# Patient Record
Sex: Female | Born: 1975 | Race: Black or African American | Hispanic: No | Smoking: Current every day smoker
Health system: Southern US, Academic
[De-identification: ages and names within clinical notes are randomized; demographics above are authoritative.]

## PROBLEM LIST (undated history)

## (undated) DIAGNOSIS — I219 Acute myocardial infarction, unspecified: Secondary | ICD-10-CM

## (undated) DIAGNOSIS — I340 Nonrheumatic mitral (valve) insufficiency: Secondary | ICD-10-CM

## (undated) DIAGNOSIS — G4730 Sleep apnea, unspecified: Secondary | ICD-10-CM

## (undated) DIAGNOSIS — Z862 Personal history of diseases of the blood and blood-forming organs and certain disorders involving the immune mechanism: Secondary | ICD-10-CM

## (undated) DIAGNOSIS — I5032 Chronic diastolic (congestive) heart failure: Secondary | ICD-10-CM

## (undated) DIAGNOSIS — I10 Essential (primary) hypertension: Secondary | ICD-10-CM

## (undated) DIAGNOSIS — J45909 Unspecified asthma, uncomplicated: Secondary | ICD-10-CM

## (undated) DIAGNOSIS — N189 Chronic kidney disease, unspecified: Secondary | ICD-10-CM

## (undated) DIAGNOSIS — J811 Chronic pulmonary edema: Secondary | ICD-10-CM

## (undated) DIAGNOSIS — K219 Gastro-esophageal reflux disease without esophagitis: Secondary | ICD-10-CM

## (undated) DIAGNOSIS — I071 Rheumatic tricuspid insufficiency: Secondary | ICD-10-CM

## (undated) DIAGNOSIS — N186 End stage renal disease: Secondary | ICD-10-CM

## (undated) DIAGNOSIS — Z9981 Dependence on supplemental oxygen: Secondary | ICD-10-CM

## (undated) DIAGNOSIS — I871 Compression of vein: Secondary | ICD-10-CM

## (undated) DIAGNOSIS — I639 Cerebral infarction, unspecified: Secondary | ICD-10-CM

## (undated) DIAGNOSIS — I2510 Atherosclerotic heart disease of native coronary artery without angina pectoris: Secondary | ICD-10-CM

## (undated) DIAGNOSIS — J449 Chronic obstructive pulmonary disease, unspecified: Secondary | ICD-10-CM

## (undated) DIAGNOSIS — Z8739 Personal history of other diseases of the musculoskeletal system and connective tissue: Secondary | ICD-10-CM

## (undated) DIAGNOSIS — I509 Heart failure, unspecified: Secondary | ICD-10-CM

## (undated) DIAGNOSIS — Z992 Dependence on renal dialysis: Secondary | ICD-10-CM

## (undated) DIAGNOSIS — N289 Disorder of kidney and ureter, unspecified: Secondary | ICD-10-CM

## (undated) HISTORY — PX: CHOLECYSTECTOMY: SHX55

## (undated) HISTORY — PX: FOOT SURGERY: SHX648

## (undated) HISTORY — PX: BACK SURGERY: SHX140

## (undated) HISTORY — PX: ABDOMINAL HYSTERECTOMY: SHX81

## (undated) HISTORY — PX: TONSILLECTOMY: SUR1361

## (undated) HISTORY — PX: HX FOOT SURGERY: 2100001154

## (undated) HISTORY — PX: HX TONSILLECTOMY: SHX27

## (undated) HISTORY — PX: HX HYSTERECTOMY: SHX81

## (undated) HISTORY — PX: ESOPHAGOGASTRODUODENOSCOPY: SHX1529

## (undated) HISTORY — PX: CORONARY ARTERY ANGIOPLASTY: PR CATH30428

## (undated) HISTORY — PX: HX GALL BLADDER SURGERY/CHOLE: SHX55

## (undated) HISTORY — PX: HX BACK SURGERY: SHX140

---
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
## 1999-02-25 DIAGNOSIS — I219 Acute myocardial infarction, unspecified: Secondary | ICD-10-CM

## 1999-02-25 HISTORY — DX: Acute myocardial infarction, unspecified: I21.9

## 2013-03-02 DIAGNOSIS — N186 End stage renal disease: Secondary | ICD-10-CM | POA: Insufficient documentation

## 2013-04-12 DIAGNOSIS — G4733 Obstructive sleep apnea (adult) (pediatric): Secondary | ICD-10-CM | POA: Insufficient documentation

## 2014-04-08 DIAGNOSIS — J449 Chronic obstructive pulmonary disease, unspecified: Secondary | ICD-10-CM | POA: Insufficient documentation

## 2014-04-08 DIAGNOSIS — N838 Other noninflammatory disorders of ovary, fallopian tube and broad ligament: Secondary | ICD-10-CM | POA: Insufficient documentation

## 2014-04-08 DIAGNOSIS — Z8711 Personal history of peptic ulcer disease: Secondary | ICD-10-CM | POA: Insufficient documentation

## 2014-04-14 DIAGNOSIS — I272 Pulmonary hypertension, unspecified: Secondary | ICD-10-CM | POA: Insufficient documentation

## 2015-02-25 DIAGNOSIS — I639 Cerebral infarction, unspecified: Secondary | ICD-10-CM

## 2015-02-25 HISTORY — DX: Cerebral infarction, unspecified: I63.9

## 2017-04-12 ENCOUNTER — Encounter (HOSPITAL_COMMUNITY): Payer: Self-pay

## 2017-04-12 ENCOUNTER — Other Ambulatory Visit: Payer: Self-pay

## 2017-04-12 ENCOUNTER — Inpatient Hospital Stay (HOSPITAL_COMMUNITY)
Admission: EM | Admit: 2017-04-12 | Discharge: 2017-04-14 | DRG: 313 | Disposition: A | Discharge status: Home / Self Care | Payer: Medicare Other | Attending: Internal Medicine | Admitting: Internal Medicine

## 2017-04-12 ENCOUNTER — Emergency Department (HOSPITAL_COMMUNITY): Payer: Medicare Other

## 2017-04-12 DIAGNOSIS — R0789 Other chest pain: Secondary | ICD-10-CM | POA: Diagnosis present

## 2017-04-12 DIAGNOSIS — E8889 Other specified metabolic disorders: Secondary | ICD-10-CM | POA: Diagnosis present

## 2017-04-12 DIAGNOSIS — G4733 Obstructive sleep apnea (adult) (pediatric): Secondary | ICD-10-CM | POA: Diagnosis present

## 2017-04-12 DIAGNOSIS — I5030 Unspecified diastolic (congestive) heart failure: Secondary | ICD-10-CM | POA: Diagnosis present

## 2017-04-12 DIAGNOSIS — Z888 Allergy status to other drugs, medicaments and biological substances status: Secondary | ICD-10-CM

## 2017-04-12 DIAGNOSIS — Z79899 Other long term (current) drug therapy: Secondary | ICD-10-CM | POA: Diagnosis not present

## 2017-04-12 DIAGNOSIS — I2510 Atherosclerotic heart disease of native coronary artery without angina pectoris: Secondary | ICD-10-CM | POA: Diagnosis present

## 2017-04-12 DIAGNOSIS — K219 Gastro-esophageal reflux disease without esophagitis: Secondary | ICD-10-CM | POA: Diagnosis present

## 2017-04-12 DIAGNOSIS — J449 Chronic obstructive pulmonary disease, unspecified: Secondary | ICD-10-CM | POA: Diagnosis present

## 2017-04-12 DIAGNOSIS — F17210 Nicotine dependence, cigarettes, uncomplicated: Secondary | ICD-10-CM | POA: Diagnosis present

## 2017-04-12 DIAGNOSIS — Z8673 Personal history of transient ischemic attack (TIA), and cerebral infarction without residual deficits: Secondary | ICD-10-CM | POA: Diagnosis not present

## 2017-04-12 DIAGNOSIS — M79609 Pain in unspecified limb: Secondary | ICD-10-CM | POA: Diagnosis not present

## 2017-04-12 DIAGNOSIS — Z720 Tobacco use: Secondary | ICD-10-CM | POA: Diagnosis not present

## 2017-04-12 DIAGNOSIS — I4581 Long QT syndrome: Secondary | ICD-10-CM | POA: Diagnosis present

## 2017-04-12 DIAGNOSIS — E876 Hypokalemia: Secondary | ICD-10-CM | POA: Diagnosis present

## 2017-04-12 DIAGNOSIS — R079 Chest pain, unspecified: Secondary | ICD-10-CM | POA: Diagnosis not present

## 2017-04-12 DIAGNOSIS — M79605 Pain in left leg: Secondary | ICD-10-CM | POA: Diagnosis present

## 2017-04-12 DIAGNOSIS — R109 Unspecified abdominal pain: Secondary | ICD-10-CM | POA: Diagnosis present

## 2017-04-12 DIAGNOSIS — I132 Hypertensive heart and chronic kidney disease with heart failure and with stage 5 chronic kidney disease, or end stage renal disease: Secondary | ICD-10-CM | POA: Diagnosis present

## 2017-04-12 DIAGNOSIS — M79652 Pain in left thigh: Secondary | ICD-10-CM | POA: Diagnosis present

## 2017-04-12 DIAGNOSIS — I252 Old myocardial infarction: Secondary | ICD-10-CM

## 2017-04-12 DIAGNOSIS — N2581 Secondary hyperparathyroidism of renal origin: Secondary | ICD-10-CM | POA: Diagnosis present

## 2017-04-12 DIAGNOSIS — Z992 Dependence on renal dialysis: Secondary | ICD-10-CM

## 2017-04-12 DIAGNOSIS — R200 Anesthesia of skin: Secondary | ICD-10-CM | POA: Diagnosis not present

## 2017-04-12 DIAGNOSIS — N186 End stage renal disease: Secondary | ICD-10-CM | POA: Diagnosis present

## 2017-04-12 DIAGNOSIS — I10 Essential (primary) hypertension: Secondary | ICD-10-CM

## 2017-04-12 DIAGNOSIS — D638 Anemia in other chronic diseases classified elsewhere: Secondary | ICD-10-CM | POA: Diagnosis present

## 2017-04-12 DIAGNOSIS — I120 Hypertensive chronic kidney disease with stage 5 chronic kidney disease or end stage renal disease: Secondary | ICD-10-CM | POA: Diagnosis not present

## 2017-04-12 DIAGNOSIS — M7918 Myalgia, other site: Secondary | ICD-10-CM | POA: Diagnosis not present

## 2017-04-12 DIAGNOSIS — E872 Acidosis: Secondary | ICD-10-CM | POA: Diagnosis not present

## 2017-04-12 DIAGNOSIS — E8770 Fluid overload, unspecified: Secondary | ICD-10-CM | POA: Diagnosis not present

## 2017-04-12 HISTORY — DX: Personal history of other diseases of the musculoskeletal system and connective tissue: Z87.39

## 2017-04-12 HISTORY — DX: Unspecified asthma, uncomplicated: J45.909

## 2017-04-12 HISTORY — DX: Heart failure, unspecified: I50.9

## 2017-04-12 HISTORY — DX: Dependence on renal dialysis: Z99.2

## 2017-04-12 HISTORY — DX: Disorder of kidney and ureter, unspecified: N28.9

## 2017-04-12 HISTORY — DX: Atherosclerotic heart disease of native coronary artery without angina pectoris: I25.10

## 2017-04-12 HISTORY — DX: Sleep apnea, unspecified: G47.30

## 2017-04-12 HISTORY — DX: Chronic obstructive pulmonary disease, unspecified: J44.9

## 2017-04-12 HISTORY — DX: Gastro-esophageal reflux disease without esophagitis: K21.9

## 2017-04-12 HISTORY — DX: Essential (primary) hypertension: I10

## 2017-04-12 HISTORY — DX: Cerebral infarction, unspecified: I63.9

## 2017-04-12 HISTORY — DX: Acute myocardial infarction, unspecified: I21.9

## 2017-04-12 LAB — BASIC METABOLIC PANEL
ANION GAP: 15 (ref 5–15)
BUN: 38 mg/dL — ABNORMAL HIGH (ref 6–20)
CHLORIDE: 99 mmol/L — AB (ref 101–111)
CO2: 22 mmol/L (ref 22–32)
Calcium: 10 mg/dL (ref 8.9–10.3)
Creatinine, Ser: 4.11 mg/dL — ABNORMAL HIGH (ref 0.44–1.00)
GFR calc non Af Amer: 12 mL/min — ABNORMAL LOW (ref >60)
GFR, EST AFRICAN AMERICAN: 14 mL/min — AB (ref >60)
Glucose, Bld: 91 mg/dL (ref 65–99)
POTASSIUM: 3.6 mmol/L (ref 3.5–5.1)
Sodium: 136 mmol/L (ref 135–145)

## 2017-04-12 LAB — CBC
HCT: 44.1 % (ref 36.0–46.0)
HEMOGLOBIN: 14.9 g/dL (ref 12.0–15.0)
MCH: 31.3 pg (ref 26.0–34.0)
MCHC: 33.8 g/dL (ref 30.0–36.0)
MCV: 92.6 fL (ref 78.0–100.0)
Platelets: 208 10*3/uL (ref 150–400)
RBC: 4.76 MIL/uL (ref 3.87–5.11)
RDW: 15.1 % (ref 11.5–15.5)
WBC: 12.5 10*3/uL — ABNORMAL HIGH (ref 4.0–10.5)

## 2017-04-12 LAB — I-STAT TROPONIN, ED: Troponin i, poc: 0.05 ng/mL (ref 0.00–0.08)

## 2017-04-12 LAB — I-STAT BETA HCG BLOOD, ED (MC, WL, AP ONLY): I-stat hCG, quantitative: <5 m[IU]/mL (ref <5)

## 2017-04-12 MED ORDER — MOMETASONE FURO-FORMOTEROL FUM 200-5 MCG/ACT IN AERO
2.0000 | INHALATION_SPRAY | Freq: Two times a day (BID) | RESPIRATORY_TRACT | Status: DC
  Administered 2017-04-12 – 2017-04-14 (×4): 2 via RESPIRATORY_TRACT
  Filled 2017-04-12 (×2): qty 8.8

## 2017-04-12 MED ORDER — ALBUTEROL SULFATE (2.5 MG/3ML) 0.083% IN NEBU: 2.5000 mg | INHALATION_SOLUTION | Freq: Four times a day (QID) | RESPIRATORY_TRACT | Status: DC | PRN

## 2017-04-12 MED ORDER — ISOSORBIDE MONONITRATE ER 30 MG PO TB24: 60.0000 mg | ORAL_TABLET | Freq: Every day | ORAL | Status: DC

## 2017-04-12 MED ORDER — CARVEDILOL 25 MG PO TABS
25.0000 mg | ORAL_TABLET | Freq: Two times a day (BID) | ORAL | Status: DC
  Administered 2017-04-12 – 2017-04-14 (×5): 25 mg via ORAL
  Filled 2017-04-12 (×2): qty 1
  Filled 2017-04-12: qty 2
  Filled 2017-04-12 (×2): qty 1

## 2017-04-12 MED ORDER — FLUTICASONE PROPIONATE 50 MCG/ACT NA SUSP
1.0000 | Freq: Two times a day (BID) | NASAL | Status: DC
  Administered 2017-04-12 – 2017-04-14 (×4): 1 via NASAL
  Filled 2017-04-12 (×2): qty 16

## 2017-04-12 MED ORDER — ONDANSETRON HCL 4 MG PO TABS: 4.0000 mg | ORAL_TABLET | Freq: Three times a day (TID) | ORAL | Status: DC | PRN

## 2017-04-12 MED ORDER — HYDRALAZINE HCL 50 MG PO TABS
100.0000 mg | ORAL_TABLET | Freq: Three times a day (TID) | ORAL | Status: DC
  Administered 2017-04-12 – 2017-04-14 (×7): 100 mg via ORAL
  Filled 2017-04-12 (×7): qty 2

## 2017-04-12 MED ORDER — ASPIRIN EC 81 MG PO TBEC
81.0000 mg | DELAYED_RELEASE_TABLET | Freq: Every day | ORAL | Status: DC
  Filled 2017-04-12 (×2): qty 1

## 2017-04-12 MED ORDER — HEPARIN SODIUM (PORCINE) 5000 UNIT/ML IJ SOLN
5000.0000 [IU] | Freq: Three times a day (TID) | INTRAMUSCULAR | Status: DC
  Filled 2017-04-12 (×2): qty 1

## 2017-04-12 MED ORDER — NITROGLYCERIN 0.4 MG SL SUBL
0.4000 mg | SUBLINGUAL_TABLET | Freq: Once | SUBLINGUAL | Status: AC
Start: 2017-04-12 — End: 2017-04-12
  Administered 2017-04-12: 0.4 mg via SUBLINGUAL
  Filled 2017-04-12: qty 1

## 2017-04-12 MED ORDER — HYDROCODONE-ACETAMINOPHEN 5-325 MG PO TABS
1.0000 | ORAL_TABLET | Freq: Once | ORAL | Status: AC
  Administered 2017-04-12: 1 via ORAL
  Filled 2017-04-12: qty 1

## 2017-04-12 MED ORDER — HYDROCODONE-ACETAMINOPHEN 5-325 MG PO TABS
1.0000 | ORAL_TABLET | Freq: Three times a day (TID) | ORAL | Status: DC | PRN
  Administered 2017-04-12 – 2017-04-14 (×4): 1 via ORAL
  Filled 2017-04-12 (×4): qty 1

## 2017-04-12 MED ORDER — FUROSEMIDE 10 MG/ML IJ SOLN
120.0000 mg | Freq: Once | INTRAVENOUS | Status: AC
  Administered 2017-04-12: 120 mg via INTRAVENOUS
  Filled 2017-04-12: qty 12

## 2017-04-12 MED ORDER — ISOSORBIDE MONONITRATE ER 60 MG PO TB24
60.0000 mg | ORAL_TABLET | Freq: Every day | ORAL | Status: DC
  Administered 2017-04-13 – 2017-04-14 (×2): 60 mg via ORAL
  Filled 2017-04-12 (×2): qty 1

## 2017-04-12 MED ORDER — NIFEDIPINE ER 60 MG PO TB24
60.0000 mg | ORAL_TABLET | Freq: Two times a day (BID) | ORAL | Status: DC
  Administered 2017-04-12 – 2017-04-14 (×5): 60 mg via ORAL
  Filled 2017-04-12 (×6): qty 1

## 2017-04-12 NOTE — ED Notes (Signed)
Admitting still at bedside 

## 2017-04-12 NOTE — ED Notes (Signed)
Lunch tray ordered 

## 2017-04-12 NOTE — ED Notes (Signed)
Admitting paged regarding pt CP. Pt reports if NTG does not work for her pain, Norco sometimes does. Admitting notified

## 2017-04-12 NOTE — ED Notes (Addendum)
Pt changing into gown at this time. Ambulatory to lobby

## 2017-04-12 NOTE — ED Notes (Signed)
Lunch tray at bedside. ?

## 2017-04-12 NOTE — ED Triage Notes (Signed)
Patient complains of CP with radiation to left arm x 2 days. Patient reports that she is dialysis patient and last treatment this past Tuesday. Had labs checked Thursday and told she didn't need treatment, no SOB

## 2017-04-12 NOTE — ED Notes (Signed)
Admitting at the bedside, will administer NTG when admitting finished with assessment.

## 2017-04-12 NOTE — ED Notes (Signed)
Pt placed on hospital bed

## 2017-04-12 NOTE — ED Notes (Addendum)
Unmeasured urinary output. While attempting to urinate, purewick suction turned off and then resumed after pt finished voiding when this RN tightened connection to tubing. Purewick repositioned, linens changed, and purewick secured with tape

## 2017-04-12 NOTE — ED Provider Notes (Signed)
[facility] Provider Note   CSN: [id number] Arrival date & time: 04/12/17  [id number]     History   Chief Complaint No chief complaint on file.   HPI Jacqueline Blackburn is a 42 y.o. female.  HPI Patient presents with chest pain some shortness of breath pain is on the left chest and goes to left arm.  Has had this before.  States she has had 16 heart attacks but never had a stent.  States that she recently moved up here from [place].  Has an nephrologist and has seen a PA but not the attending yet.  Has an appointment to see cardiology.  History of congestive heart failure.  Does not know what her ejection fraction is however.  States she has been taking her medicines.  States her blood pressures been running high the last couple days.  The chest pain is also been for the last couple days.  She was last dialyzed on Tuesday.  She is been on dialysis for 11 years.  States that she was not dialyzed on Thursday because they checked some lab work and she was told she did not need to have dialysis.  Took a nitroglycerin yesterday. Past Medical History:  Diagnosis Date  . CHF (congestive heart failure) (HCC)   . Coronary artery disease   . Hemodialysis patient (HCC)   . Hypertension   . Renal disorder     There are no active problems to display for this patient.   History reviewed. No pertinent surgical history.  OB History    No data available       Home Medications    Prior to Admission medications   Medication Sig Start Date End Date Taking? Authorizing Provider  albuterol (PROVENTIL HFA;VENTOLIN HFA) 108 (90 Base) MCG/ACT inhaler Inhale 2 puffs into the lungs every 4 (four) hours as needed for wheezing or shortness of breath.   Yes [provider]  aspirin EC 81 MG tablet Take 81 mg by mouth daily.   Yes [provider]  aspirin-acetaminophen-caffeine (EXCEDRIN MIGRAINE) 650-206-8531250-250-65 MG tablet Take 2 tablets by mouth every 6 (six)  hours as needed for headache.   Yes [provider]  carvedilol (COREG) 25 MG tablet Take 25 mg by mouth 2 (two) times daily with a meal.   Yes [provider]  cloNIDine (CATAPRES - DOSED IN MG/24 HR) 0.1 mg/24hr patch Place 0.1 mg onto the skin once a week.   Yes [provider]  cloNIDine (CATAPRES - DOSED IN MG/24 HR) 0.3 mg/24hr patch Place 0.3 mg onto the skin once a week.   Yes [provider]  fluticasone (FLONASE) 50 MCG/ACT nasal spray Place 1 spray into both nostrils 2 (two) times daily.   Yes [provider]  furosemide (LASIX) 80 MG tablet Take 160 mg by mouth 2 (two) times daily.   Yes [provider]  hydrALAZINE (APRESOLINE) 100 MG tablet Take 100 mg by mouth 3 (three) times daily.   Yes [provider]  isosorbide mononitrate (IMDUR) 60 MG 24 hr tablet Take 60 mg by mouth daily.   Yes [provider]  ondansetron (ZOFRAN) 4 MG tablet Take 4 mg by mouth every 8 (eight) hours as needed for nausea or vomiting.   Yes [provider]    Family History No family history on file.  Social History Social History   Tobacco Use  . Smoking status: Current Every Day Smoker  . Smokeless tobacco: Never  Used  Substance Use Topics  . Alcohol use: Not on file  . Drug use: Not on file     Allergies   Lisinopril   Review of Systems Review of Systems  Constitutional: Positive for appetite change.  HENT: Negative for congestion.   Respiratory: Positive for shortness of breath.   Cardiovascular: Positive for chest pain.  Gastrointestinal: Negative for abdominal pain.  Genitourinary: Negative for dysuria.  Musculoskeletal: Negative for back pain.  Skin: Negative for rash.  Neurological: Negative for numbness.  Hematological: Negative for adenopathy.  Psychiatric/Behavioral: Negative for confusion.     Physical Exam Updated Vital Signs BP (!) 182/99   Pulse 69   Temp 98.5 F (36.9 C) (Oral)    Resp 18   SpO2 100%   Physical Exam  Constitutional: She appears well-developed.  HENT:  Head: Atraumatic.  Eyes: Pupils are equal, round, and reactive to light.  Cardiovascular: Normal rate.  Pulmonary/Chest: She has wheezes. She has no rales. She exhibits no tenderness.  Abdominal: Soft.  Musculoskeletal:  Dialysis graft to left upper extremity.  Neurological: She is alert.  Skin: Skin is warm. Capillary refill takes less than 2 seconds.  Psychiatric: She has a normal mood and affect.     ED Treatments / Results  Labs (all labs ordered are listed, but only abnormal results are displayed) Labs Reviewed  BASIC METABOLIC PANEL - Abnormal; Notable for the following components:      Result Value   Chloride 99 (*)    BUN 38 (*)    Creatinine, Ser 4.11 (*)    GFR calc non Af Amer 12 (*)    GFR calc Af Amer 14 (*)    All other components within normal limits  CBC - Abnormal; Notable for the following components:   WBC 12.5 (*)    All other components within normal limits  I-STAT TROPONIN, ED  I-STAT BETA HCG BLOOD, ED ([facility], [facility], [facility] ONLY)    EKG  EKG Interpretation  Date/Time:  Sunday April 12 2017 09:00:51 EST Ventricular Rate:  72 PR Interval:  206 QRS Duration: 100 QT Interval:  464 QTC Calculation: 508 R Axis:   -40 Text Interpretation:  Normal sinus rhythm Possible Left atrial enlargement Left axis deviation Left ventricular hypertrophy with repolarization abnormality Prolonged QT Abnormal ECG Confirmed by Benjiman Core [phone]) on 04/12/2017 11:58:38 AM       Radiology Dg Chest 2 View  Result Date: 04/12/2017 CLINICAL DATA:  Chest pain radiating to left arm 2 days. Last dialysis treatment 6 days ago. EXAM: CHEST  2 VIEW COMPARISON:  None. FINDINGS: Lungs are adequately inflated without focal lobar consolidation or effusion. Subtle hazy prominence of the perihilar vessels likely mild degree of vascular congestion. Evidence of left axillary vascular stent.  Cardiomediastinal silhouette and remainder of the exam is unremarkable. IMPRESSION: Suggestion of minimal vascular congestion. Electronically Signed   By: Elberta Fortis M.D.   On: 04/12/2017 09:46    Procedures Procedures (including critical care time)  Medications Ordered in ED Medications  nitroGLYCERIN (NITROSTAT) SL tablet 0.4 mg (not administered)     Initial Impression / Assessment and Plan / ED Course  I have reviewed the triage vital signs and the nursing notes.  Pertinent labs & imaging results that were available during my care of the patient were reviewed by me and considered in my medical decision making (see chart for details).     Patient presents with chest pain.  Left-sided.  Pain improved somewhat now  but still dull.  EKG has nonspecific changes but no old to compare.  Patient states she has had 16 heart attacks but no stent.  Patient's blood pressure is elevated.  X-ray shows mild vascular congestion.  Last dialyzed on Tuesday.  With the chest pain and mild volume overload I think patient would benefit from admission especially since we do not know her true cardiac history.  Patient is also had some wheezes.  History of asthma.  Final Clinical Impressions(s) / ED Diagnoses   Final diagnoses:  Chest pain, unspecified type  End stage renal disease on dialysis [facility])  Hypertension, unspecified type    ED Discharge Orders    None       Benjiman Core, MD 04/12/17 1246

## 2017-04-12 NOTE — ED Notes (Signed)
Pt requesting ice.

## 2017-04-12 NOTE — H&P (Signed)
Date: 04/12/2017               Patient Name:  Jacqueline Blackburn MRN: [id number]  DOB: October 30, 1975 Age / Sex: 42 y.o., female   PCP: Patient, No Pcp Per         Medical Service: Internal Medicine Teaching Service         Attending Physician: Dr. Lynnae January, Real Cons, MD    First Contact: Dr. Johny Chess Pager: [phone]  Second Contact: Dr. Philipp Ovens Pager: [phone]       After Hours (After 5p/  First Contact Pager: [phone]  weekends / holidays): Second Contact Pager: [phone]   Chief Complaint: Chest Pain  History of Present Illness: Jacqueline Blackburn is a 42 yo F with a past medical history of CAD, CHF, ESRD on HD, OSA, COPD/asthma, tobacco use, GERD who presented to the ED with complaints of chest pain.   She has had a two day history of left sided chest pain described as a tightness which has been constant. Denies modifying factors but does note some mild dyspnea on exertion. No associated diaphoresis. She also notes an associated headache (posterior, no photophobia), intermittent blurry vision, and diarrhea (4 BMs yesterday) which also started two days ago.    She has a history of CAD and CHF per her report, previously seen by a Cardiologist in [place], last cath performed about 1 year ago--she was told there were no significant blockages that required stents at that time. She has a history of similar pain and notes multiple heart attacks in the past but states the pain has been more severe in the past. She has not missed any medication doses and is taking all meds as prescribed. She has an outpatient dialysis center set up and last received HD five days ago on 2/12. She states she was told at her following session that she did not need HD at that point and they would try to get her off HD based on her lab work and urine output.  In the ED, T 98.5, BP 208/106, 100% on RA. Labs notable for Cr 4.11, BUN 38, eGFR 14, I stat troponin negative, Hgb 14.9. She was given ntg and admitted for further  management.     Meds:  Current Meds  Medication Sig  . albuterol (PROVENTIL HFA;VENTOLIN HFA) 108 (90 Base) MCG/ACT inhaler Inhale 2 puffs into the lungs every 4 (four) hours as needed for wheezing or shortness of breath.  Marland Kitchen aspirin EC 81 MG tablet Take 81 mg by mouth daily.  Marland Kitchen aspirin-acetaminophen-caffeine (EXCEDRIN MIGRAINE) 250-250-65 MG tablet Take 2 tablets by mouth every 6 (six) hours as needed for headache.  . carvedilol (COREG) 25 MG tablet Take 25 mg by mouth 2 (two) times daily with a meal.  . cloNIDine (CATAPRES - DOSED IN MG/24 HR) 0.1 mg/24hr patch Place 0.1 mg onto the skin once a week.  . cloNIDine (CATAPRES - DOSED IN MG/24 HR) 0.3 mg/24hr patch Place 0.3 mg onto the skin once a week.  . fluticasone (FLONASE) 50 MCG/ACT nasal spray Place 1 spray into both nostrils 2 (two) times daily.  . furosemide (LASIX) 80 MG tablet Take 160 mg by mouth 2 (two) times daily.  . hydrALAZINE (APRESOLINE) 100 MG tablet Take 100 mg by mouth 3 (three) times daily.  . isosorbide mononitrate (IMDUR) 60 MG 24 hr tablet Take 60 mg by mouth daily.  . ondansetron (ZOFRAN) 4 MG tablet Take 4 mg by mouth every 8 (eight) hours as  needed for nausea or vomiting.     Allergies: Allergies as of 04/12/2017 - Review Complete 04/12/2017  Allergen Reaction Noted  . Lisinopril Other (See Comments) 04/12/2017   Past Medical History:  Diagnosis Date  . Asthma   . CHF (congestive heart failure) ([place])   . COPD (chronic obstructive pulmonary disease) ([place])   . Coronary artery disease   . GERD (gastroesophageal reflux disease)   . Hemodialysis patient ([place])   . History of degenerative disc disease   . Hypertension   . MI (myocardial infarction) ([place]) 2001   Patient reports 16 MIs  . Renal disorder    ESRD  . Sleep apnea   . Stroke [facility]) 2017   "mild stroke/TIA"    Family History: Hypertension and DM in multiple family members.  Maternal family members: Multiple members with cancer, unsure types    Social History: Tobacco use since age 59, currently smoking about 1/4th ppd. No alcohol use. Regular marijuana use, no other drug use.  She just moved here after being in [place] and [place] before this, has been living here for the past three weeks.   Review of Systems: A complete ROS was negative except as per HPI.   Physical Exam: Blood pressure (!) 182/103, pulse (!) 32, temperature 98.5 F (36.9 C), temperature source Oral, resp. rate 17, SpO2 (!) 75 %. General: Sitting up in bed comfortably, no acute distress Head: Normocephalic, atraumatic Eyes: PERRL, EOMI ENT: Moist mucus membranes, no pharyngeal exudate CV: RRR at time of exam, diastolic murmur though may be radiation of fistula  Resp: Occasional wheeze to L lung fields, otherwise no significant crackles, normal work of breathing, no distress  Abd: Soft, +BS, non-tender to palpation  Extr: Slight LE edema, TTP of proximal L thigh with no overlying skin changes or deformities noted. LUE AVF with palpable thrill   Neuro: Alert and oriented x3, no FND on gross exam  Skin: Warm, dry      EKG: personally reviewed my interpretation is sinus rhythm, prolonged PR and QT intervals, inverted T in V6, I and aVL, no other ST changes appreciated. No prior available for comparison.   CXR: personally reviewed my interpretation is no pleural effusions, no significant cardiomegaly, no focal consolidations. Read as mild vascular congestion with prominent hilar vessels.   Assessment & Plan by Problem:  Chest Pain, H/o HTN, HF, CAD Pt reports a significant cardiac history with prior care and workup under her prior cardiologist in [place]. Unclear as to the extent of her CAD though reports no stents or other interventions required, or her HF. Currently on an outpatient Nifedipine, Hydral, Lasix, Coreg, Imdur, clonidine patches for blood pressure and cardiac regimen. Her EKG is without ischemic changes, troponin negative in setting of sx  for two days and renal disease. Unlikely to be ACS and may be more related to volume status and elevated BP in the setting of a period without dialysis sessions. Will attempt to obtain records to clarify cardiology history and trial an increase in diuretic.  --Monitor vital signs, tele --Cont home regimen --BMP, I/O, daily weights  --Lasix 120 mg x 1, assess response on output, BP  --BMP --Request records in am      ESRD on HD Pt has been on dialysis for ~10 years due to hypertension associated renal disease per her report. She has established with [facility] for an HD center in [place] was told she may be able to come off HD and did not receive  her scheduled Thursday session. Her prior EDW was 102, recently increased to 104--today she is at 107.3. Her BP and acute elevation despite no missed home medication doses may reflect this period of no HD and increased dry weight. Her GFR, Hgb, and urine output are higher than would expect for a long term HD patient and may be able to diurese as above. No indications for urgent HD.  --Notify nephrology in am    H/o COPD, OSA Long smoking history and currently on home albuterol prn and dulera. Has slight wheezing, no respiratory distress. Does not wear CPAP at home  --Cont home dulera  --Albuterol nebulizer q6hr prn      Dispo: Admit patient to Observation with expected length of stay less than 2 midnights.  Signed: Tawny Asal, MD 04/12/2017, 2:46 PM  Pager: [phone]

## 2017-04-12 NOTE — ED Notes (Signed)
Attempted report x1. 

## 2017-04-13 ENCOUNTER — Other Ambulatory Visit: Payer: Self-pay

## 2017-04-13 DIAGNOSIS — R109 Unspecified abdominal pain: Secondary | ICD-10-CM

## 2017-04-13 DIAGNOSIS — Z79899 Other long term (current) drug therapy: Secondary | ICD-10-CM

## 2017-04-13 DIAGNOSIS — I120 Hypertensive chronic kidney disease with stage 5 chronic kidney disease or end stage renal disease: Secondary | ICD-10-CM

## 2017-04-13 DIAGNOSIS — N186 End stage renal disease: Secondary | ICD-10-CM

## 2017-04-13 DIAGNOSIS — Z720 Tobacco use: Secondary | ICD-10-CM

## 2017-04-13 DIAGNOSIS — R079 Chest pain, unspecified: Secondary | ICD-10-CM

## 2017-04-13 DIAGNOSIS — M7918 Myalgia, other site: Secondary | ICD-10-CM

## 2017-04-13 DIAGNOSIS — E8770 Fluid overload, unspecified: Secondary | ICD-10-CM

## 2017-04-13 DIAGNOSIS — G4733 Obstructive sleep apnea (adult) (pediatric): Secondary | ICD-10-CM

## 2017-04-13 DIAGNOSIS — M79652 Pain in left thigh: Secondary | ICD-10-CM

## 2017-04-13 DIAGNOSIS — Z992 Dependence on renal dialysis: Secondary | ICD-10-CM

## 2017-04-13 DIAGNOSIS — Z8679 Personal history of other diseases of the circulatory system: Secondary | ICD-10-CM

## 2017-04-13 DIAGNOSIS — I252 Old myocardial infarction: Secondary | ICD-10-CM

## 2017-04-13 DIAGNOSIS — R200 Anesthesia of skin: Secondary | ICD-10-CM

## 2017-04-13 LAB — BASIC METABOLIC PANEL WITH GFR
Anion gap: 14 (ref 5–15)
BUN: 39 mg/dL — ABNORMAL HIGH (ref 6–20)
CO2: 24 mmol/L (ref 22–32)
Calcium: 9.6 mg/dL (ref 8.9–10.3)
Chloride: 99 mmol/L — ABNORMAL LOW (ref 101–111)
Creatinine, Ser: 4.06 mg/dL — ABNORMAL HIGH (ref 0.44–1.00)
GFR calc Af Amer: 15 mL/min — ABNORMAL LOW
GFR calc non Af Amer: 13 mL/min — ABNORMAL LOW
Glucose, Bld: 102 mg/dL — ABNORMAL HIGH (ref 65–99)
Potassium: 3.1 mmol/L — ABNORMAL LOW (ref 3.5–5.1)
Sodium: 137 mmol/L (ref 135–145)

## 2017-04-13 LAB — BASIC METABOLIC PANEL
ANION GAP: 14 (ref 5–15)
BUN: 42 mg/dL — ABNORMAL HIGH (ref 6–20)
CO2: 21 mmol/L — ABNORMAL LOW (ref 22–32)
Calcium: 9.7 mg/dL (ref 8.9–10.3)
Chloride: 101 mmol/L (ref 101–111)
Creatinine, Ser: 4.22 mg/dL — ABNORMAL HIGH (ref 0.44–1.00)
GFR, EST AFRICAN AMERICAN: 14 mL/min — AB (ref >60)
GFR, EST NON AFRICAN AMERICAN: 12 mL/min — AB (ref >60)
Glucose, Bld: 78 mg/dL (ref 65–99)
Potassium: 3.6 mmol/L (ref 3.5–5.1)
Sodium: 136 mmol/L (ref 135–145)

## 2017-04-13 LAB — CBC
HCT: 42.6 % (ref 36.0–46.0)
Hemoglobin: 14.2 g/dL (ref 12.0–15.0)
MCH: 30.6 pg (ref 26.0–34.0)
MCHC: 33.3 g/dL (ref 30.0–36.0)
MCV: 91.8 fL (ref 78.0–100.0)
Platelets: 229 10*3/uL (ref 150–400)
RBC: 4.64 MIL/uL (ref 3.87–5.11)
RDW: 15.4 % (ref 11.5–15.5)
WBC: 11.7 10*3/uL — ABNORMAL HIGH (ref 4.0–10.5)

## 2017-04-13 LAB — HIV ANTIBODY (ROUTINE TESTING W REFLEX): HIV Screen 4th Generation wRfx: NONREACTIVE

## 2017-04-13 LAB — CK: Total CK: 152 U/L (ref 38–234)

## 2017-04-13 MED ORDER — POTASSIUM CHLORIDE CRYS ER 20 MEQ PO TBCR
40.0000 meq | EXTENDED_RELEASE_TABLET | Freq: Once | ORAL | Status: AC
  Administered 2017-04-13: 40 meq via ORAL
  Filled 2017-04-13: qty 2

## 2017-04-13 MED ORDER — ACETAMINOPHEN 325 MG PO TABS: 650.0000 mg | ORAL_TABLET | Freq: Four times a day (QID) | ORAL | Status: DC | PRN

## 2017-04-13 MED ORDER — FUROSEMIDE 80 MG PO TABS
160.0000 mg | ORAL_TABLET | Freq: Two times a day (BID) | ORAL | Status: DC
  Filled 2017-04-13: qty 2

## 2017-04-13 NOTE — Progress Notes (Signed)
Pt upset about care, states "the doctors are trying to treat my kidneys, but that's not what I came in for, I had chest pain and my L thigh hurts", attempted to explain to pt that fluid overload can cause chest pain. Pt's roommate/friend at bedside concerned pt not getting the care she needs. Pt gave verbal permission to talk about her with roommate while in the room as well as it was okay if she talked with the doctors. Team of doctors rounded on pt this morning pt states before her roommate arrived.  Dr Anthonette LegatoHarden, resident with Dr Rogelia BogaButcher paged at this time and informed him that pt's roommate at bedside and pt giving permission to talk with her, requesting someone to come back to room to talk with pt and roommate.  Dr Anthonette LegatoHarden informed pt refused aspirin and lasix.   Dr Anthonette LegatoHarden states he will be back up to talk with them.

## 2017-04-13 NOTE — Progress Notes (Addendum)
Subjective: No acute events overnight following admission, she put out 1.5 L with IV dose of lasix, net negative 1 L and weight down to 104 kg. She is frustrated this morning because of fluid restriction noted on her diet and having to urinate overnight. She states her chest pain is still present and states the pain is not isolated to her chest as in past episodes, but extends all the way down her arm to her hand, down her side/flank and to her proximal thigh. Unable to describe the pain stating "it just hurts", no pain below knee.    Objective:  Vital signs in last 24 hours: Vitals:   04/12/17 2039 04/12/17 2123 04/13/17 0510 04/13/17 0928  BP:  (!) 153/97 (!) 164/103   Pulse:   82   Resp:   18   Temp:   97.7 F (36.5 C)   TempSrc:   Oral   SpO2: 98%   99%  Weight:   230 lb 11.2 oz (104.6 kg)   Height:   5\' 7"  (1.702 m)    General: Resting in bed comfortably, no acute distress HEENT: Moist mucus membranes, normal conjuctiva  CV: RRR, questionable murmur v fistula thrill radiation  Resp: Clear breath sounds bilaterally, normal work of breathing, no distress  Abd: Soft, +BS, tenderness to L flank, otherwise non-tender  Extr: TTP of proximal L thigh with no overlying skin changes or deformities, no obvious asymmetry compared to R though difficult with habitus, no significant LE edema Neuro: Alert and oriented x3 Skin: Warm, dry      Assessment/Plan:   Chest Pain, Flank and Proximal Thigh Pain Pt presented with two day history of constant chest pain which is also extends to the flank and proximal thigh. Acute cardiac cause unlikely, no improvement with volume decrease. The pain is not over a certain neuro or dermatome distribution. Not on a statin and would expect more symmetric sx with other causes of myalgia/myopathies, may also be MSK related.  --CK --Tylenol 650, Norco 5-325 q8hr prn (dose note to exceed 3.5 g acetaminophen)    H/o HTN, HF, CAD  Pt reports a cardiac history  including CAD and HF, previously followed by a Cardiologist in [place]. No stents or other interventions required in the past, last cath or echo about one year ago. Currently on an extensive HTN regimen as an outpatient. Her EKG has non-specific T wave inversions in lateral leads (I, aVL, V6), no prior available for comparison. Troponin negative in setting of sx for two days and renal dz, not trended. Will attempt to obtain records for cardiology history.  --Monitor vital signs --Cont home HTN regimen--nifedipine may not be an ideal agent depending on HF hx --Resume home diuretic dosing   --Echo  --BMP, daily weights, I/Os  --Request records     ESRD on HD Pt has been on dialysis for ~10 years due to hypertension associated renal disease per her report. She has established with [facility] for an HD center in [place]--she was told she may be able to come off HD and did not receive her scheduled Thursday session. Her prior EDW was 102, recently increased to 104--was at 107.3 and responded well to IV Lasix dose with improvement in her weight to 104 kg. Her BP and acute elevation despite no missed home medication doses may have been due to no HD, increased weight. Nephrology consulted to clarify management of her renal disease and updated recommendations, appreciate their assistance.  --No urgent indication for  HD  --BMP   Dispo: Anticipated discharge in approximately 1-2 day(s).   Ginger Carne, MD 04/13/2017, 9:55 AM Pager: [phone]

## 2017-04-13 NOTE — Consult Note (Signed)
[facility] Renal Consultation Note    Indication for Consultation:  Management of ESRD/hemodialysis; anemia, hypertension/volume and secondary hyperparathyroidism PCP: No PCP here-recently relocated from [place].   HPI: Jacqueline Blackburn is a 42 y.o. female with ESRD on hemodialysis T,Th,S at [facility]. PMH of HF, MI, COPD (current smoker) HTN, OSA, AOCD, SHPT.   Patient recently relocated from [place] to [place], [place]. She started hemodialysis T,Th,S at [facility] but apparently had only been attending HD twice weekly at her former center. Labs drawn 03/31/17 revealed Scr 4.1 K+ 4.4 Phos 4.1 after missing HD for a week prior to move. 24 hour urine showed urea clearance of 13.  She was seen 04/07/2017 by Dr. Vanetta Mulders who decided to hold hemodialysis and monitor labs.    She presented to ED 04/12/17 with C/O L sided chest pain, mild SOB and diarrhea. She was found to be hypertensive with BP 208/106. CXR suggestive of minimal vascular congestion.  Scr 4.1 BUN 38 EGFR 14, HGB 14.9 WBC 12.5 Troponin 0.05 EKG showed SR with LAD, LVH, prolonged Qt interval. She has been admitted for chest pain per primary. Wt was up on admission to 107.3 kg. She has since been diuresed with IV furosemide and she is back to OP EDW 104 kg. She has never been chest pain free since admission, now C/O L chest pain radiation down L arm and L thigh. She rates pain 6/10 throbbing in nature. She denies SOB at present. Upset because she has urinated so much after furosemide.   Past Medical History:  Diagnosis Date  . Asthma   . CHF (congestive heart failure) ([place])   . COPD (chronic obstructive pulmonary disease) ([place])   . Coronary artery disease   . GERD (gastroesophageal reflux disease)   . Hemodialysis patient ([place])   . History of degenerative disc disease   . Hypertension   . MI (myocardial infarction) ([place]) 2001   Patient reports 16 MIs  . Renal disorder    ESRD   . Sleep apnea   . Stroke [facility]) 2017   "mild stroke/TIA"   Past Surgical History:  Procedure Laterality Date  . ABDOMINAL HYSTERECTOMY    . BACK SURGERY    . CHOLECYSTECTOMY    . FOOT SURGERY Right   . TONSILLECTOMY     No family history on file. Social History:  reports that she has been smoking.  she has never used smokeless tobacco. Her alcohol and drug histories are not on file. Allergies  Allergen Reactions  . Lisinopril Other (See Comments)   Prior to Admission medications   Medication Sig Start Date End Date Taking? Authorizing Provider  albuterol (PROVENTIL HFA;VENTOLIN HFA) 108 (90 Base) MCG/ACT inhaler Inhale 2 puffs into the lungs every 4 (four) hours as needed for wheezing or shortness of breath.   Yes [provider]  aspirin EC 81 MG tablet Take 81 mg by mouth daily.   Yes [provider]  aspirin-acetaminophen-caffeine (EXCEDRIN MIGRAINE) [phone] MG tablet Take 2 tablets by mouth every 6 (six) hours as needed for headache.   Yes [provider]  carvedilol (COREG) 25 MG tablet Take 25 mg by mouth 2 (two) times daily with a meal.   Yes [provider]  cloNIDine (CATAPRES - DOSED IN MG/24 HR) 0.1 mg/24hr patch Place 0.1 mg onto the skin once a week.   Yes [provider]  cloNIDine (CATAPRES - DOSED IN MG/24 HR) 0.3 mg/24hr patch Place 0.3 mg onto  the skin once a week.   Yes [provider]  fluticasone (FLONASE) 50 MCG/ACT nasal spray Place 1 spray into both nostrils 2 (two) times daily.   Yes [provider]  furosemide (LASIX) 80 MG tablet Take 160 mg by mouth 2 (two) times daily.   Yes [provider]  hydrALAZINE (APRESOLINE) 100 MG tablet Take 100 mg by mouth 3 (three) times daily.   Yes [provider]  isosorbide mononitrate (IMDUR) 60 MG 24 hr tablet Take 60 mg by mouth daily.   Yes [provider]  meclizine (ANTIVERT) 25 MG tablet Take 25 mg by mouth every 8 (eight)  hours as needed for dizziness.   Yes [provider]  NIFEdipine (PROCARDIA-XL/ADALAT CC) 60 MG 24 hr tablet Take 60 mg by mouth 2 (two) times daily.   Yes [provider]  nitroGLYCERIN (NITROSTAT) 0.4 MG SL tablet Place 0.4 mg under the tongue every 5 (five) minutes as needed for chest pain.   Yes [provider]  ondansetron (ZOFRAN) 4 MG tablet Take 4 mg by mouth every 8 (eight) hours as needed for nausea or vomiting.   Yes [provider]   Current Facility-Administered Medications  Medication Dose Route Frequency Provider Last Rate Last Dose  . acetaminophen (TYLENOL) tablet 650 mg  650 mg Oral Q6H PRN Tawny Asal, MD      . albuterol (PROVENTIL) (2.5 MG/3ML) 0.083% nebulizer solution 2.5 mg  2.5 mg Nebulization Q6H PRN Velna Ochs, MD      . aspirin EC tablet 81 mg  81 mg Oral Daily Velna Ochs, MD      . carvedilol (COREG) tablet 25 mg  25 mg Oral BID WC Velna Ochs, MD   25 mg at 04/13/17 1001  . fluticasone (FLONASE) 50 MCG/ACT nasal spray 1 spray  1 spray Each Nare BID Velna Ochs, MD   1 spray at 04/13/17 1002  . furosemide (LASIX) tablet 160 mg  160 mg Oral BID Velna Ochs, MD      . heparin injection 5,000 Units  5,000 Units Subcutaneous Q8H Velna Ochs, MD      . hydrALAZINE (APRESOLINE) tablet 100 mg  100 mg Oral TID Velna Ochs, MD   100 mg at 04/13/17 1001  . HYDROcodone-acetaminophen (NORCO/VICODIN) 5-325 MG per tablet 1 tablet  1 tablet Oral Q8H PRN Tawny Asal, MD   1 tablet at 04/13/17 0507  . isosorbide mononitrate (IMDUR) 24 hr tablet 60 mg  60 mg Oral Daily Velna Ochs, MD   60 mg at 04/13/17 1001  . mometasone-formoterol (DULERA) 200-5 MCG/ACT inhaler 2 puff  2 puff Inhalation BID Velna Ochs, MD   2 puff at 04/13/17 [phone]  . NIFEdipine (PROCARDIA-XL/ADALAT CC) 24 hr tablet 60 mg  60 mg Oral BID Velna Ochs, MD   60 mg at 04/13/17 1001  . ondansetron (ZOFRAN) tablet 4 mg  4  mg Oral Q8H PRN Velna Ochs, MD      . potassium chloride SA (K-DUR,KLOR-CON) CR tablet 40 mEq  40 mEq Oral Once Tawny Asal, MD       Labs: Basic Metabolic Panel: Recent Labs  Lab 04/12/17 0918 04/13/17 0511  NA 136 137  K 3.6 3.1*  CL 99* 99*  CO2 22 24  GLUCOSE 91 102*  BUN 38* 39*  CREATININE 4.11* 4.06*  CALCIUM 10.0 9.6   Liver Function Tests: No results for input(s): AST, ALT, ALKPHOS, BILITOT, PROT, ALBUMIN in the last 168 hours. No results for  input(s): LIPASE, AMYLASE in the last 168 hours. No results for input(s): AMMONIA in the last 168 hours. CBC: Recent Labs  Lab 04/12/17 0918 04/13/17 0511  WBC 12.5* 11.7*  HGB 14.9 14.2  HCT 44.1 42.6  MCV 92.6 91.8  PLT 208 229   Cardiac Enzymes: Recent Labs  Lab 04/13/17 0511  CKTOTAL 152   CBG: No results for input(s): GLUCAP in the last 168 hours. Iron Studies: No results for input(s): IRON, TIBC, TRANSFERRIN, FERRITIN in the last 72 hours. Studies/Results: Dg Chest 2 View  Result Date: 04/12/2017 CLINICAL DATA:  Chest pain radiating to left arm 2 days. Last dialysis treatment 6 days ago. EXAM: CHEST  2 VIEW COMPARISON:  None. FINDINGS: Lungs are adequately inflated without focal lobar consolidation or effusion. Subtle hazy prominence of the perihilar vessels likely mild degree of vascular congestion. Evidence of left axillary vascular stent. Cardiomediastinal silhouette and remainder of the exam is unremarkable. IMPRESSION: Suggestion of minimal vascular congestion. Electronically Signed   By: Marin Olp M.D.   On: 04/12/2017 09:46    ROS: As per HPI otherwise negative.   Physical Exam: Vitals:   04/12/17 2039 04/12/17 2123 04/13/17 0510 04/13/17 0928  BP:  (!) 153/97 (!) 164/103   Pulse:   82   Resp:   18   Temp:   97.7 F (36.5 C)   TempSrc:   Oral   SpO2: 98%   99%  Weight:   104.6 kg (230 lb 11.2 oz)   Height:   5' 7" (1.702 m)      General: Well developed, well nourished, in no acute  distress. Head: Normocephalic, atraumatic, sclera non-icteric, mucus membranes are moist Neck: Supple. JVD not elevated. Lungs: Clear bilaterally to auscultation without wheezes, rales, or rhonchi. Breathing is unlabored. Heart: RRR with S1 S2. No murmurs, rubs, or gallops appreciated. Abdomen: Soft, non-tender, non-distended with normoactive bowel sounds. No rebound/guarding. No obvious abdominal masses. M-S:  Strength and tone appear normal for age. Lower extremities:without edema or ischemic changes, no open wounds  Neuro: Alert and oriented X 3. Moves all extremities spontaneously. Psych:  Responds to questions appropriately with a normal affect. Dialysis Access: LUA AVG + bruit  Dialysis Orders: HD ORDER CURRENTLY ON HOLD [place] T,Th,S 3 hrs 45 min 180 NRe 450/800 Manual 104 Kg. 3.0 K/ 2.25 Ca  -No heparin -Hectorol 1 mcg IV TIW   Assessment/Plan: 1.  Chest Pain: per primary. Atypical presentation. Troponin 0.05 CK total 152.  2.  Hypokalemia: K+ 3.1 Given [place] per primary.  3.  ESRD -  Has been on HD-labs consistent with CKD stage 5. Holding HD. Responds well to lasix. No acute HD needs at present.  4.  Hypertension/volume  -BP still elevated, but improved since adm. No evidence of volume overload. Continue current meds.  5.  Anemia  - HGB 14.2 No OP ESA.  6.  Metabolic bone disease -  OP BMD labs at goal. No binders. Low dose VDRA.  7.  Nutrition -Renal diet with fld restrictions.  8.  H/O HF. Primary getting records from MD in [place].   Rita H. Owens Shark, NP-C 04/13/2017, 12:34 PM  [facility] Beeper [phone]  Pt seen, examined and agree w A/P as above. Dialysis patient may be recovering some renal function.  Holding off on HD for now, watch labs and symptoms. Here for CP eval.  Kelly Splinter MD [place] pager [phone]   04/13/2017, 3:20 PM

## 2017-04-13 NOTE — Care Management Note (Signed)
Case Management Note  Patient Details  Name: Meredith StaggersCassie Touchette MRN: [id number] Date of Birth: 09/15/1975  Subjective/Objective:  Pt presented for Chest Pain- Hx: CAD, ESRD. Pt just recently moved from [place] 3 weeks ago. Pt has Research scientist (life sciences)ephrologist and Cardiologist [place] in [place]. CM received referral for PCP: [facility] not taking any new patients at this time.                Action/Plan: CM did provide patient with Health Connect Information in regards to PCP needs. Patient will call to establish appointment. No further needs from CM at this time.    Expected Discharge Date:                  Expected Discharge Plan:  Home/Self Care  In-House Referral:  PCP / [facility] Connect  Discharge planning Services  CM Consult  Post Acute Care Choice:  NA Choice offered to:  NA  DME Arranged:  N/A DME Agency:  NA  HH Arranged:  NA HH Agency:  NA  Status of Service:  Completed, signed off  If discussed at Long Length of Stay Meetings, dates discussed:    Additional Comments:  Gala LewandowskyGraves-Bigelow, Adonys Wildes Kaye, RN 04/13/2017, 11:40 AM

## 2017-04-13 NOTE — Progress Notes (Signed)
Pt continues to refuse lasix. She states she told MD earlier today and her renal MD Dr Kathrene BongoGoldsborough knows.

## 2017-04-13 NOTE — Progress Notes (Signed)
Date: 04/13/2017  Patient name: Jacqueline Blackburn  Medical record number: [id number]  Date of birth: 02-07-1976   I have seen and evaluated Jacqueline Blackburn and discussed their care with the Residency Team. Ms Jacqueline Blackburn is a 42 yo female with PMHx sig for HTN, OSA, ESRD on HF but transitioning off HF, and tobacco use. She presented with a 2 day history of left sided pain including her chest/thorax, LUE, abdomen area, and thigh. The pain does not go distal to her left knee. This is been constant without relief since 2 days prior to admission. She states she has had chest pain like this before but not this long and not over the left part of her body. She states her left hand is numb but she has no other altered sensation over this area.  The admitting team noted that her last hemodialysis was on the 12th as her local nephrologist does not feel she needs hemodialysis. There was a concern that she had volume overload as her dry weight was up from her dry weight of 104 to 107. She was treated with 1 dose of IV Lasix 120 mg and she diuresed down to her dry weight of 104 kg.   PMHx, Fam Hx, and/or Soc Hx : [place] [facility] [place] [place]. Seeing  Kidney but notes not available.   Vitals:   04/13/17 0510 04/13/17 0928  BP: (!) 164/103   Pulse: 82   Resp: 18   Temp: 97.7 F (36.5 C)   SpO2:  99%   Lying in bed, NAD, speaking in full sentences, phone on and in hand HRRR no MRG LCTAB with good air flow Skin : no abnl over painful L side ABD + BS  Cr 4.11 - 4.06 K 3.6 - 3.1 CO2 22 - 24 BUN 38 - 29 Hgb 14.9 Trop 0.05  I personally viewed the CXR images and confirmed my reading with the official read. 2 view, PA and lateral, no abnl  I personally viewed the EKG and confirmed my reading with the official read. Sinus, LAD, LAH, LVH, TWI lateral leads  Assessment and Plan: I have seen and evaluated the patient as outlined above. I agree with the formulated Assessment and Plan as detailed in  the residents' note, with the following changes:  Ms. Jacqueline Blackburn is a 42 year old woman who is on hemodialysis that is being transitioned off hemodialysis. She presented with pain on the left side of her body extending  not distal to the knee. She ruled out for an acute MI with a troponin and EKG. Volume overload was felt to be contributing to her chest pain and she was diuresed to her dry weight with 1 dose of IV Lasix. Currently she is having left-sided pain from her chest to her knee. Her symptoms do not fit any disease script, dermatome pattern, and there are no skin changes. She was most tender to palpation of the left lateral thigh. I am concerned we are not going to be able to determine the etiology of these symptoms.  1. L sided pain - agree with Dr. Cammy BrochureHarden's recommendation to check a CK. Continue to take a history daily as she may reveal more details to help narrow down the etiology. Symptomatic treatment.   2. Volume overload secondary to end-stage renal disease, transitioning off hemodialysis - now at dry weight after 1 dose of IV Lasix. There is no indication for emergent hemodialysis as she does not have hyperkalemia, acidosis, and is not uremic. Nephrology  to the consultation. Obtain ECHO.  3. H/O 41 AMI's not requiring stenting - obtain outpt cards records from [place].   Burns Spain, MD 2/18/201910:38 AM

## 2017-04-14 ENCOUNTER — Inpatient Hospital Stay (HOSPITAL_COMMUNITY): Payer: Medicare Other

## 2017-04-14 DIAGNOSIS — Z888 Allergy status to other drugs, medicaments and biological substances status: Secondary | ICD-10-CM

## 2017-04-14 DIAGNOSIS — I5030 Unspecified diastolic (congestive) heart failure: Secondary | ICD-10-CM

## 2017-04-14 DIAGNOSIS — I132 Hypertensive heart and chronic kidney disease with heart failure and with stage 5 chronic kidney disease, or end stage renal disease: Secondary | ICD-10-CM

## 2017-04-14 DIAGNOSIS — I2510 Atherosclerotic heart disease of native coronary artery without angina pectoris: Secondary | ICD-10-CM

## 2017-04-14 DIAGNOSIS — R079 Chest pain, unspecified: Secondary | ICD-10-CM

## 2017-04-14 DIAGNOSIS — E872 Acidosis: Secondary | ICD-10-CM

## 2017-04-14 DIAGNOSIS — M79609 Pain in unspecified limb: Secondary | ICD-10-CM

## 2017-04-14 LAB — CBC
HCT: 37.6 % (ref 36.0–46.0)
Hemoglobin: 12.4 g/dL (ref 12.0–15.0)
MCH: 30.5 pg (ref 26.0–34.0)
MCHC: 33 g/dL (ref 30.0–36.0)
MCV: 92.6 fL (ref 78.0–100.0)
Platelets: 149 10*3/uL — ABNORMAL LOW (ref 150–400)
RBC: 4.06 MIL/uL (ref 3.87–5.11)
RDW: 15.6 % — ABNORMAL HIGH (ref 11.5–15.5)
WBC: 8.4 10*3/uL (ref 4.0–10.5)

## 2017-04-14 LAB — BASIC METABOLIC PANEL
ANION GAP: 13 (ref 5–15)
BUN: 47 mg/dL — ABNORMAL HIGH (ref 6–20)
CO2: 18 mmol/L — ABNORMAL LOW (ref 22–32)
Calcium: 9.1 mg/dL (ref 8.9–10.3)
Chloride: 102 mmol/L (ref 101–111)
Creatinine, Ser: 4.44 mg/dL — ABNORMAL HIGH (ref 0.44–1.00)
GFR calc Af Amer: 13 mL/min — ABNORMAL LOW (ref >60)
GFR, EST NON AFRICAN AMERICAN: 11 mL/min — AB (ref >60)
GLUCOSE: 88 mg/dL (ref 65–99)
POTASSIUM: 4.1 mmol/L (ref 3.5–5.1)
SODIUM: 133 mmol/L — AB (ref 135–145)

## 2017-04-14 LAB — ECHOCARDIOGRAM COMPLETE
HEIGHTINCHES: 67 in
Weight: 3703.73 oz

## 2017-04-14 MED ORDER — SODIUM BICARBONATE 650 MG PO TABS
650.0000 mg | ORAL_TABLET | Freq: Three times a day (TID) | ORAL | Status: DC
  Administered 2017-04-14: 650 mg via ORAL
  Filled 2017-04-14: qty 1

## 2017-04-14 MED ORDER — FUROSEMIDE 80 MG PO TABS: 160.0000 mg | ORAL_TABLET | ORAL | Status: DC | PRN

## 2017-04-14 MED ORDER — MOMETASONE FURO-FORMOTEROL FUM 200-5 MCG/ACT IN AERO: 2.0000 | INHALATION_SPRAY | Freq: Two times a day (BID) | RESPIRATORY_TRACT | Status: AC

## 2017-04-14 MED ORDER — TRAMADOL HCL 50 MG PO TABS: 50.0000 mg | ORAL_TABLET | Freq: Two times a day (BID) | ORAL | 0 refills | Status: AC | PRN

## 2017-04-14 MED ORDER — SODIUM BICARBONATE 650 MG PO TABS: 650.0000 mg | ORAL_TABLET | Freq: Three times a day (TID) | ORAL | 1 refills | Status: AC

## 2017-04-14 NOTE — Progress Notes (Signed)
Subjective: No acute events overnight, she was slightly positive in fluid balance over last 24 hrs, weight today 105 kg. She has declined her home Lasix dosing and heparin ppx. This morning she reports stable L sided pain compared to prior. It worsens with movement, denies numbness/tingling, able to ambulate. She again expresses frustration with various aspects of her medical care and health both throughout her past and in the present. Attempted to provide empathy and understanding.   Objective:  Vital signs in last 24 hours: Vitals:   04/13/17 1415 04/13/17 1951 04/13/17 2116 04/14/17 0500  BP: (!) 136/100 (!) 109/59  126/72  Pulse:  (!) 57  62  Resp:  18  18  Temp: 98.4 F (36.9 C) 98.2 F (36.8 C)  98.3 F (36.8 C)  TempSrc: Oral Oral  Oral  SpO2:  98% 98% 98%  Weight:    231 lb 7.7 oz (105 kg)  Height:       General: Resting in bed comfortably, no acute distress HEENT: Moist mucus membranes, normal conjuctiva  CV: RRR, questionable murmur v fistula thrill radiation  Resp: Clear breath sounds bilaterally, normal work of breathing, no distress  Abd: Soft, +BS, tenderness to L flank, otherwise non-tender  Extr: TTP of proximal L thigh with no overlying skin changes or deformities, no obvious asymmetry compared to R though difficult with habitus, no significant LE edema-stable compared to prior  Neuro: Alert and oriented x3 Skin: Warm, dry      Assessment/Plan:   Chest Pain, Flank and Proximal Thigh Pain Pt presented with two day history of constant chest pain which is also extends to the flank and proximal thigh. Acute cardiac cause unlikely, no improvement with volume decrease. The pain is not over a certain neuro or dermatome distribution. Not on a statin and would expect more symmetric sx with other causes of myalgia/myopathies. LE neurovascularly intact. CK within normal limits. LE Dopplers pending.  --F/u LE dopplers  --Tylenol 650, Norco 5-325 q8hr prn (dose note to  exceed 3.5 g acetaminophen)    H/o HTN, HFpEF, CAD  Pt reports a cardiac history including CAD and HF, previously followed by a Cardiologist in [place]. Records obtained show last cath was in 05/2015 which showed 50-55% mid LAD lesion, 40-45% distal Cx lesion and mild RCA disease. Last echo 10/2014 showed EF >65%, hyperdynamic with LVH, impaired diastolic dysfunction. No stents or interventions required in past. Currently on an extensive HTN regimen as an outpatient. Her EKG has non-specific T wave inversions in lateral leads (I, aVL, V6), which are stable compared to prior obtained with records. Troponin was negative in setting of sx for two days and renal dz. Low suspicion of cardiac cause for her pain.   --Monitor vital signs --Cont home HTN regimen --Resume home diuretic dosing if pt willing to take    --F/u Echo  --BMP, daily weights, I/Os    ESRD on HD Pt has been on dialysis for ~10 years due to hypertension associated renal disease per her report. She has established with [facility] for an HD center in [place] has not received HD since 2/12 as Nephrology is trialing a period without HD. Her prior EDW was 102, recently increased to 104--was at 107.3 and responded well to IV Lasix dose with improvement in her weight. Her BP and acute elevation despite no missed home medication doses may have been due this increase in weight. Nephrology consulted to clarify management of her renal disease, appreciate their input. She has  been declining her home Lasix, labs appear stable and weight today is 105. --No urgent indication for HD, continue to monitor   --BMP   Dispo: Anticipated discharge in approximately 0-1 day(s).   Ginger CarneHarden, Quinzell Malcomb, MD 04/14/2017, 7:13 AM Pager: [phone]

## 2017-04-14 NOTE — Progress Notes (Signed)
Left lower extremity venous duplex completed. No evidence of DVT, superficial thrombosis, or Baker's cyst. Toma DeitersVirginia Slaughter, RVS 04/14/2017, 3:24 PM

## 2017-04-14 NOTE — Progress Notes (Signed)
  Echocardiogram 2D Echocardiogram has been performed.  Leta JunglingCooper, Shaarav Ripple M 04/14/2017, 12:13 PM

## 2017-04-14 NOTE — Progress Notes (Signed)
[facility] Progress Note   Subjective: Still C/O tenderness R chest and R thigh. Says she is cramping from all the furosemide. Denies SOB. Echo pending.  Very tired today, not sleeping at night and wondering if her sleep apnea might be playing a role in this.   Objective Vitals:   04/13/17 2116 04/14/17 0500 04/14/17 0907 04/14/17 0933  BP:  126/72  130/68  Pulse:  62  60  Resp:  18    Temp:  98.3 F (36.8 C)    TempSrc:  Oral    SpO2: 98% 98% 98%   Weight:  105 kg (231 lb 7.7 oz)    Height:       Physical Exam General: WN, WD NAD Heart: S1,S2, RRR No M/R/G (can hear radiation of AVG in chest) Lungs: CTAB A/P Abdomen: Active BS, ND, NT Extremities: No LE edema Dialysis Access: LUA AVG +B/T  Additional Objective Labs: Basic Metabolic Panel: Recent Labs  Lab 04/13/17 0511 04/13/17 1823 04/14/17 0748  NA 137 136 133*  K 3.1* 3.6 4.1  CL 99* 101 102  CO2 24 21* 18*  GLUCOSE 102* 78 88  BUN 39* 42* 47*  CREATININE 4.06* 4.22* 4.44*  CALCIUM 9.6 9.7 9.1   Liver Function Tests: No results for input(s): AST, ALT, ALKPHOS, BILITOT, PROT, ALBUMIN in the last 168 hours. No results for input(s): LIPASE, AMYLASE in the last 168 hours. CBC: Recent Labs  Lab 04/12/17 0918 04/13/17 0511 04/14/17 0748  WBC 12.5* 11.7* 8.4  HGB 14.9 14.2 12.4  HCT 44.1 42.6 37.6  MCV 92.6 91.8 92.6  PLT 208 229 149*   Blood Culture No results found for: SDES, SPECREQUEST, CULT, REPTSTATUS  Cardiac Enzymes: Recent Labs  Lab 04/13/17 0511  CKTOTAL 152   CBG: No results for input(s): GLUCAP in the last 168 hours. Iron Studies: No results for input(s): IRON, TIBC, TRANSFERRIN, FERRITIN in the last 72 hours. _0 @ Studies/Results: No results found. Medications:  . aspirin EC  81 mg Oral Daily  . carvedilol  25 mg Oral BID WC  . fluticasone  1 spray Each Nare BID  . furosemide  160 mg Oral BID  . heparin  5,000 Units Subcutaneous Q8H  . hydrALAZINE   100 mg Oral TID  . isosorbide mononitrate  60 mg Oral Daily  . mometasone-formoterol  2 puff Inhalation BID  . NIFEdipine  60 mg Oral BID     Dialysis Orders: HD ORDER CURRENTLY ON HOLD [place] T,Th,S 3 hrs 45 min 180 NRe 450/800 Manual 104 Kg. 3.0 K/ 2.25 Ca  -No heparin -Hectorol 1 mcg IV TIW   Assessment/Plan: 1.  Chest Pain: per primary. Atypical presentation. Troponin 0.05 CK total 152. Per primary 2.  Hypokalemia: K+ 3.1 Given [place] per primary. K+ 4.1 today.  3.  ESRD -  Has been on HD-labs consistent with CKD stage 5. Holding HD. Responds well to lasix. No acute HD needs at present. Scr 4.4 EGFR 13. Co2 down slightly 18-add oral sodium bicarbonate. No evidence of uremia, creat running low 4 range.  No indication for HD at this time.  Will hold lasix due to muscle cramping and lack of edema on exam.   4.  Hypertension/volume  -Better control of BP now.  No evidence of volume overload. Continue current meds. Has been refusing OP furosemide. Describes cramping which could be result of hypokalemia/over-diuresis. Will hold furosemide, assess volume.  5.  Anemia  - HGB 14.2 No OP ESA.  6.  Metabolic bone disease -  OP BMD labs at goal. No binders. Low dose VDRA.  7.  Nutrition -Renal diet with fld restrictions.  8.  H/O HFpEF. Primary rec'drecords from MD in [place]. Last EF >65% 2016. Repeat Echo today per primary.  9. OSA-not on CPAP. Doesn't have machine. Needs repeat sleep study.   Disposition: Stable from renal prospective for DC.  We will ensure that after dc pt has F/U appt with [facility] (Dr. Moshe Cipro) within 2 weeks of discharge. Continue going to OP HD center for weekly labs.    Rita H. Brown NP-C 04/14/2017, 11:39 AM  [facility] [phone]  Pt seen, examined, agree w assess/plan as above with additions as indicated.  Kelly Splinter MD [employer] pager [phone]    cell [phone] 04/14/2017, 2:43 PM

## 2017-04-15 DIAGNOSIS — R079 Chest pain, unspecified: Secondary | ICD-10-CM | POA: Diagnosis present

## 2017-04-15 DIAGNOSIS — M79605 Pain in left leg: Secondary | ICD-10-CM | POA: Diagnosis present

## 2017-04-15 NOTE — Discharge Summary (Signed)
Name: Jacqueline Blackburn MRN: [id number] DOB: 10-14-1975 42 y.o. PCP: Patient, No Pcp Per  Date of Admission: 04/12/2017 11:54 AM Date of Discharge: 04/14/2017 Attending Physician: Dr. Jessy Oto  Discharge Diagnosis:  Active Problems:   ESRD (end stage renal disease) on dialysis (HCC) L Sided Pain   Discharge Medications: Allergies as of 04/14/2017      Reactions   Lisinopril Other (See Comments)      Medication List    TAKE these medications   albuterol 108 (90 Base) MCG/ACT inhaler Commonly known as:  PROVENTIL HFA;VENTOLIN HFA Inhale 2 puffs into the lungs every 4 (four) hours as needed for wheezing or shortness of breath.   aspirin EC 81 MG tablet Take 81 mg by mouth daily.   aspirin-acetaminophen-caffeine 250-250-65 MG tablet Commonly known as:  EXCEDRIN MIGRAINE Take 2 tablets by mouth every 6 (six) hours as needed for headache.   carvedilol 25 MG tablet Commonly known as:  COREG Take 25 mg by mouth 2 (two) times daily with a meal.   cloNIDine 0.3 mg/24hr patch Commonly known as:  CATAPRES - Dosed in mg/24 hr Place 0.3 mg onto the skin once a week.   cloNIDine 0.1 mg/24hr patch Commonly known as:  CATAPRES - Dosed in mg/24 hr Place 0.1 mg onto the skin once a week.   fluticasone 50 MCG/ACT nasal spray Commonly known as:  FLONASE Place 1 spray into both nostrils 2 (two) times daily.   furosemide 80 MG tablet Commonly known as:  LASIX Take 2 tablets (160 mg total) by mouth as needed (For shortness or breath or weight gain over 3-5 lbs). What changed:    when to take this  reasons to take this   hydrALAZINE 100 MG tablet Commonly known as:  APRESOLINE Take 100 mg by mouth 3 (three) times daily.   isosorbide mononitrate 60 MG 24 hr tablet Commonly known as:  IMDUR Take 60 mg by mouth daily.   meclizine 25 MG tablet Commonly known as:  ANTIVERT Take 25 mg by mouth every 8 (eight) hours as needed for dizziness.   mometasone-formoterol 200-5  MCG/ACT Aero Commonly known as:  DULERA Inhale 2 puffs into the lungs 2 (two) times daily.   NIFEdipine 60 MG 24 hr tablet Commonly known as:  PROCARDIA-XL/ADALAT CC Take 60 mg by mouth 2 (two) times daily.   nitroGLYCERIN 0.4 MG SL tablet Commonly known as:  NITROSTAT Place 0.4 mg under the tongue every 5 (five) minutes as needed for chest pain.   ondansetron 4 MG tablet Commonly known as:  ZOFRAN Take 4 mg by mouth every 8 (eight) hours as needed for nausea or vomiting.   sodium bicarbonate 650 MG tablet Take 1 tablet (650 mg total) by mouth 3 (three) times daily.   traMADol 50 MG tablet Commonly known as:  ULTRAM Take 1 tablet (50 mg total) by mouth every 12 (twelve) hours as needed for up to 5 days for moderate pain.       Disposition and follow-up:   [name] was discharged from [facility] in Stable condition.  At the hospital follow up visit please address:  1.  --Assess for symptoms of L sided pain (chest, L flank, L proximal thigh) or changes in sx that would guide further workup  --Assess volume status, BP and need for resumption of Lasix, or consideration of HD  --Encourage regular follow up with specialists and establishing with PCP   2.  Labs / imaging needed at  time of follow-up: None  3.  Pending labs/ test needing follow-up: None   Follow-up Appointments:   Hospital Course by problem list:   Chest Pain, Flank and Proximal Thigh Pain (Left) Pt presented with two day history of constant chest pain which is also extends to the flank and proximal thigh. Increased volume was initially felt to be a potential cause, but her sx did not improve with diuresis. An acute cardiac cause was felt to be unlikely with stable EKG and troponin. The pain is not over a certain neuro or dermatome distribution. She was not on a statin and would expect more symmetric sx with other causes of myalgia/myopathies and CK was within normal limits. Her LE was  neurovascularly intact with no overlying skin changes or deformities-LE doppler obtained prior to discharge was negative for thrombosis or venous abnormalities as a cause. She was provided a short course of Tramadol and encouraged to use Tylenol upon discharge. Duloxetine or gabapentin may be a consideration for pain control though her description and distribution is not classically neuropathic.   H/o HTN, HFpEF, CAD Pt reports a cardiac history including CAD and HF, previously followed by a Cardiologist in [place]. Records obtained during her admission showed last cath was in 05/2015 which showed 50-55% mid LAD lesion, 40-45% distal Cx lesion and mild RCA disease. Last echo 10/2014 showed EF >65%, hyperdynamic with LVH, impaired diastolic dysfunction. No stents or interventions required in past. Her EKG has non-specific T wave inversions in lateral leads (I, aVL, V6), which were stable compared to prior obtained with records. Troponin was negative in setting of sx for two days and renal dz. Repeat echo during admission was similar to prior with preserved, hyperdynamic EF 65-70% with severe hypertrophy and impaired diastolic function. She is on an extensive anti-hypertensive regimen as an outpatient and was initially hypertensive upon arrival which improved with initial diuresis and remained stable with home medications, 148/82 at discharge.     ESRD on HD Pt had been on dialysis for ~10 years due to hypertension associated renal disease per her report. She established with [facility] for an HD center in [place] had not received HD since 2/12 as Nephrology was monitoring her off of HD after initially seeing her as a pt.Her prior EDW was 102, had recently been increased to 104--she was at 107.3 on presentation and responded well to an IV Lasix dose with improvement in her weight. Her BP and acute elevation despiteno missed home medication doses may have been due this increase in weight. Nephrology was  consulted and will continue to monitor her renal function off of HD and off of Lasix for the time being, will follow closely as an outpatient. She was also prescribed sodium bicarbonate for slight acidosis.   Discharge Vitals:   BP (!) 148/82 (BP Location: Right Arm)   Pulse 60   Temp 98.3 F (36.8 C) (Oral)   Resp 20   Ht 5\' 7"  (1.702 m)   Wt 231 lb 7.7 oz (105 kg)   SpO2 100%   BMI 36.26 kg/m   Pertinent Labs, Studies, and Procedures:  BMP Latest Ref Rng & Units 04/14/2017 04/13/2017 04/13/2017  Glucose 65 - 99 mg/dL 88 78 161(W)  BUN 6 - 20 mg/dL 96(E) 45(W) 09(W)  Creatinine 0.44 - 1.00 mg/dL 1.19(J) 4.78(G) 9.56(O)  Sodium 135 - 145 mmol/L 133(L) 136 137  Potassium 3.5 - 5.1 mmol/L 4.1 3.6 3.1(L)  Chloride 101 - 111 mmol/L 102 101 99(L)  CO2  22 - 32 mmol/L 18(L) 21(L) 24  Calcium 8.9 - 10.3 mg/dL 9.1 9.7 9.6     Discharge Instructions: Discharge Instructions    Discharge instructions   Complete by:  As directed    Nice to meet you Ms. Wannamaker --We are unsure what specifically could be causing this pain, but the tests we've done here and the records your other cardiologist sent are reassuring that it is not your heart.  -- The ultrasound of your leg was negative for any blood clots, and the ultrasound of your heart was similar to your previous study report. Your heart appears to be pumping efficiently.  --We prescribed a short course of pain medicine. Otherwise, we recommend Tylenol (you can take 1000 mg every 6 hours as needed), and avoid ibuprofen, aleve.  --We are also glad you may not need dialysis. The kidney doctor would like you to stop taking your lasix for now. If you become short of breath, or gain more than 3-5 lbs, please take 160 mg (2 tablets) of your lasix. It is important that you weigh yourself daily. Please continue to follow with the kidney doctors to check on your labs.  -- Please start taking sodium bicarb tablets three times a day. A new prescription has  been sent to your pharmacy.  --Remember your appointment with your new Cardiologist coming up in March also.  --We think it would be beneficial for you to establish with a good primary care doctor that can help direct/coordinate your medical care among all the specialists and get to know you in order to see the big picture for your health  Thank you!  - Dr. Anthonette LegatoHarden      Signed: Ginger CarneHarden, Joshoa Shawler, MD 04/15/2017, 1:35 PM   Pager: [phone]

## 2017-05-18 ENCOUNTER — Inpatient Hospital Stay (HOSPITAL_COMMUNITY): Admission: RE | Admit: 2017-05-18 | Payer: Self-pay | Source: Ambulatory Visit | Admitting: Internal Medicine

## 2017-05-26 ENCOUNTER — Telehealth (HOSPITAL_COMMUNITY): Payer: Self-pay | Admitting: Internal Medicine

## 2017-05-26 NOTE — Telephone Encounter (Signed)
Patient missed New CHF appt with Dr. Gala RomneyBensimhon on 05/18/17.  Called patient to reschedule appt to next available appt per Meredith StaggersHeather Schub, RN.  No answer or VM on patient's phone.  Will try to contact later to reschedule.

## 2017-06-16 NOTE — Telephone Encounter (Signed)
Tried to contact patient to reschedule New CHF appt with Dr. Gala RomneyBensimhon that pt missed on 05/18/17, still no answer or machine to leave message.

## 2017-06-30 ENCOUNTER — Emergency Department (HOSPITAL_COMMUNITY): Payer: Medicare Other

## 2017-06-30 ENCOUNTER — Other Ambulatory Visit: Payer: Self-pay

## 2017-06-30 ENCOUNTER — Encounter (HOSPITAL_COMMUNITY): Payer: Self-pay | Admitting: Emergency Medicine

## 2017-06-30 ENCOUNTER — Inpatient Hospital Stay (HOSPITAL_COMMUNITY)
Admission: EM | Admit: 2017-06-30 | Discharge: 2017-07-01 | DRG: 190 | Disposition: A | Discharge status: Home / Self Care | Payer: Medicare Other | Attending: Internal Medicine | Admitting: Internal Medicine

## 2017-06-30 DIAGNOSIS — K219 Gastro-esophageal reflux disease without esophagitis: Secondary | ICD-10-CM | POA: Diagnosis present

## 2017-06-30 DIAGNOSIS — I252 Old myocardial infarction: Secondary | ICD-10-CM

## 2017-06-30 DIAGNOSIS — Z90710 Acquired absence of both cervix and uterus: Secondary | ICD-10-CM | POA: Diagnosis not present

## 2017-06-30 DIAGNOSIS — Z833 Family history of diabetes mellitus: Secondary | ICD-10-CM

## 2017-06-30 DIAGNOSIS — Z7982 Long term (current) use of aspirin: Secondary | ICD-10-CM

## 2017-06-30 DIAGNOSIS — Z9049 Acquired absence of other specified parts of digestive tract: Secondary | ICD-10-CM | POA: Diagnosis not present

## 2017-06-30 DIAGNOSIS — G4733 Obstructive sleep apnea (adult) (pediatric): Secondary | ICD-10-CM | POA: Diagnosis present

## 2017-06-30 DIAGNOSIS — Z9119 Patient's noncompliance with other medical treatment and regimen: Secondary | ICD-10-CM | POA: Diagnosis not present

## 2017-06-30 DIAGNOSIS — I248 Other forms of acute ischemic heart disease: Secondary | ICD-10-CM | POA: Diagnosis present

## 2017-06-30 DIAGNOSIS — I5032 Chronic diastolic (congestive) heart failure: Secondary | ICD-10-CM | POA: Diagnosis present

## 2017-06-30 DIAGNOSIS — E876 Hypokalemia: Secondary | ICD-10-CM | POA: Diagnosis present

## 2017-06-30 DIAGNOSIS — Z8673 Personal history of transient ischemic attack (TIA), and cerebral infarction without residual deficits: Secondary | ICD-10-CM | POA: Diagnosis not present

## 2017-06-30 DIAGNOSIS — M7989 Other specified soft tissue disorders: Secondary | ICD-10-CM | POA: Diagnosis not present

## 2017-06-30 DIAGNOSIS — I2510 Atherosclerotic heart disease of native coronary artery without angina pectoris: Secondary | ICD-10-CM | POA: Diagnosis present

## 2017-06-30 DIAGNOSIS — N186 End stage renal disease: Secondary | ICD-10-CM | POA: Diagnosis present

## 2017-06-30 DIAGNOSIS — R0600 Dyspnea, unspecified: Secondary | ICD-10-CM | POA: Diagnosis present

## 2017-06-30 DIAGNOSIS — Z6836 Body mass index (BMI) 36.0-36.9, adult: Secondary | ICD-10-CM

## 2017-06-30 DIAGNOSIS — Z825 Family history of asthma and other chronic lower respiratory diseases: Secondary | ICD-10-CM | POA: Diagnosis not present

## 2017-06-30 DIAGNOSIS — J441 Chronic obstructive pulmonary disease with (acute) exacerbation: Secondary | ICD-10-CM | POA: Diagnosis not present

## 2017-06-30 DIAGNOSIS — Z888 Allergy status to other drugs, medicaments and biological substances status: Secondary | ICD-10-CM | POA: Diagnosis not present

## 2017-06-30 DIAGNOSIS — E669 Obesity, unspecified: Secondary | ICD-10-CM | POA: Diagnosis present

## 2017-06-30 DIAGNOSIS — Z8249 Family history of ischemic heart disease and other diseases of the circulatory system: Secondary | ICD-10-CM | POA: Diagnosis not present

## 2017-06-30 DIAGNOSIS — Z7951 Long term (current) use of inhaled steroids: Secondary | ICD-10-CM

## 2017-06-30 DIAGNOSIS — F17210 Nicotine dependence, cigarettes, uncomplicated: Secondary | ICD-10-CM | POA: Diagnosis present

## 2017-06-30 DIAGNOSIS — R0602 Shortness of breath: Secondary | ICD-10-CM | POA: Diagnosis not present

## 2017-06-30 DIAGNOSIS — I132 Hypertensive heart and chronic kidney disease with heart failure and with stage 5 chronic kidney disease, or end stage renal disease: Secondary | ICD-10-CM | POA: Diagnosis present

## 2017-06-30 DIAGNOSIS — R7989 Other specified abnormal findings of blood chemistry: Secondary | ICD-10-CM

## 2017-06-30 DIAGNOSIS — Z823 Family history of stroke: Secondary | ICD-10-CM

## 2017-06-30 DIAGNOSIS — R778 Other specified abnormalities of plasma proteins: Secondary | ICD-10-CM

## 2017-06-30 DIAGNOSIS — F17200 Nicotine dependence, unspecified, uncomplicated: Secondary | ICD-10-CM | POA: Diagnosis not present

## 2017-06-30 LAB — BASIC METABOLIC PANEL
ANION GAP: 13 (ref 5–15)
BUN: 52 mg/dL — AB (ref 6–20)
CO2: 25 mmol/L (ref 22–32)
Calcium: 9.3 mg/dL (ref 8.9–10.3)
Chloride: 101 mmol/L (ref 101–111)
Creatinine, Ser: 4.35 mg/dL — ABNORMAL HIGH (ref 0.44–1.00)
GFR calc Af Amer: 13 mL/min — ABNORMAL LOW (ref >60)
GFR, EST NON AFRICAN AMERICAN: 12 mL/min — AB (ref >60)
Glucose, Bld: 77 mg/dL (ref 65–99)
POTASSIUM: 2.8 mmol/L — AB (ref 3.5–5.1)
SODIUM: 139 mmol/L (ref 135–145)

## 2017-06-30 LAB — CBC
HEMATOCRIT: 36.7 % (ref 36.0–46.0)
Hemoglobin: 12 g/dL (ref 12.0–15.0)
MCH: 29.5 pg (ref 26.0–34.0)
MCHC: 32.7 g/dL (ref 30.0–36.0)
MCV: 90.2 fL (ref 78.0–100.0)
Platelets: 252 10*3/uL (ref 150–400)
RBC: 4.07 MIL/uL (ref 3.87–5.11)
RDW: 16 % — AB (ref 11.5–15.5)
WBC: 10 10*3/uL (ref 4.0–10.5)

## 2017-06-30 LAB — HEPATIC FUNCTION PANEL
ALBUMIN: 3.3 g/dL — AB (ref 3.5–5.0)
ALT: 11 U/L — AB (ref 14–54)
AST: 30 U/L (ref 15–41)
Alkaline Phosphatase: 78 U/L (ref 38–126)
Bilirubin, Direct: <0.1 mg/dL — ABNORMAL LOW (ref 0.1–0.5)
Total Bilirubin: 0.5 mg/dL (ref 0.3–1.2)
Total Protein: 7 g/dL (ref 6.5–8.1)

## 2017-06-30 LAB — PROTIME-INR
INR: 0.9
PROTHROMBIN TIME: 12.1 s (ref 11.4–15.2)

## 2017-06-30 LAB — I-STAT TROPONIN, ED
Troponin i, poc: 0.19 ng/mL (ref 0.00–0.08)
Troponin i, poc: 0.12 ng/mL (ref 0.00–0.08)

## 2017-06-30 LAB — BRAIN NATRIURETIC PEPTIDE: B NATRIURETIC PEPTIDE 5: 427.5 pg/mL — AB (ref 0.0–100.0)

## 2017-06-30 LAB — LIPASE, BLOOD: Lipase: 85 U/L — ABNORMAL HIGH (ref 11–51)

## 2017-06-30 LAB — I-STAT BETA HCG BLOOD, ED (MC, WL, AP ONLY): I-stat hCG, quantitative: <5 m[IU]/mL (ref <5)

## 2017-06-30 LAB — MAGNESIUM: MAGNESIUM: 1.7 mg/dL (ref 1.7–2.4)

## 2017-06-30 MED ORDER — ONDANSETRON HCL 4 MG/2ML IJ SOLN
4.0000 mg | Freq: Once | INTRAMUSCULAR | Status: AC
  Administered 2017-06-30: 4 mg via INTRAVENOUS
  Filled 2017-06-30: qty 2

## 2017-06-30 MED ORDER — PREDNISONE 20 MG PO TABS: 40.0000 mg | ORAL_TABLET | Freq: Once | ORAL | Status: DC

## 2017-06-30 MED ORDER — ASPIRIN EC 81 MG PO TBEC
81.0000 mg | DELAYED_RELEASE_TABLET | Freq: Every day | ORAL | Status: DC
  Administered 2017-06-30 – 2017-07-01 (×2): 81 mg via ORAL
  Filled 2017-06-30 (×2): qty 1

## 2017-06-30 MED ORDER — ACETAMINOPHEN 325 MG PO TABS: 650.0000 mg | ORAL_TABLET | Freq: Four times a day (QID) | ORAL | Status: DC | PRN

## 2017-06-30 MED ORDER — NIFEDIPINE ER 60 MG PO TB24
60.0000 mg | ORAL_TABLET | Freq: Two times a day (BID) | ORAL | Status: DC
  Administered 2017-06-30 – 2017-07-01 (×2): 60 mg via ORAL
  Filled 2017-06-30 (×2): qty 1

## 2017-06-30 MED ORDER — IPRATROPIUM BROMIDE 0.02 % IN SOLN
0.5000 mg | Freq: Once | RESPIRATORY_TRACT | Status: AC
  Administered 2017-06-30: 0.5 mg via RESPIRATORY_TRACT
  Filled 2017-06-30: qty 2.5

## 2017-06-30 MED ORDER — NICOTINE 7 MG/24HR TD PT24
7.0000 mg | MEDICATED_PATCH | Freq: Every day | TRANSDERMAL | Status: DC
  Administered 2017-06-30 – 2017-07-01 (×2): 7 mg via TRANSDERMAL
  Filled 2017-06-30 (×2): qty 1

## 2017-06-30 MED ORDER — MOMETASONE FURO-FORMOTEROL FUM 200-5 MCG/ACT IN AERO
2.0000 | INHALATION_SPRAY | Freq: Two times a day (BID) | RESPIRATORY_TRACT | Status: DC
  Administered 2017-07-01: 2 via RESPIRATORY_TRACT
  Filled 2017-06-30: qty 8.8

## 2017-06-30 MED ORDER — ASPIRIN 325 MG PO TABS
325.0000 mg | ORAL_TABLET | Freq: Once | ORAL | Status: AC
  Administered 2017-06-30: 325 mg via ORAL
  Filled 2017-06-30: qty 1

## 2017-06-30 MED ORDER — ALBUTEROL SULFATE (2.5 MG/3ML) 0.083% IN NEBU
2.5000 mg | INHALATION_SOLUTION | RESPIRATORY_TRACT | Status: DC | PRN
Start: 2017-06-30 — End: 2017-07-01

## 2017-06-30 MED ORDER — FUROSEMIDE 20 MG PO TABS: 160.0000 mg | ORAL_TABLET | ORAL | Status: DC | PRN

## 2017-06-30 MED ORDER — NIFEDIPINE ER 60 MG PO TB24
60.0000 mg | ORAL_TABLET | Freq: Two times a day (BID) | ORAL | Status: DC
  Filled 2017-06-30 (×2): qty 1

## 2017-06-30 MED ORDER — IPRATROPIUM-ALBUTEROL 0.5-2.5 (3) MG/3ML IN SOLN
3.0000 mL | Freq: Four times a day (QID) | RESPIRATORY_TRACT | Status: DC
  Administered 2017-06-30: 3 mL via RESPIRATORY_TRACT
  Filled 2017-06-30: qty 3

## 2017-06-30 MED ORDER — HYDRALAZINE HCL 50 MG PO TABS
100.0000 mg | ORAL_TABLET | Freq: Three times a day (TID) | ORAL | Status: DC
  Administered 2017-06-30 – 2017-07-01 (×3): 100 mg via ORAL
  Filled 2017-06-30 (×3): qty 2

## 2017-06-30 MED ORDER — ACETAMINOPHEN 650 MG RE SUPP: 650.0000 mg | Freq: Four times a day (QID) | RECTAL | Status: DC | PRN

## 2017-06-30 MED ORDER — PREDNISONE 20 MG PO TABS
40.0000 mg | ORAL_TABLET | Freq: Every day | ORAL | Status: DC
  Administered 2017-07-01: 40 mg via ORAL
  Filled 2017-06-30: qty 2

## 2017-06-30 MED ORDER — HYDRALAZINE HCL 50 MG PO TABS: 100.0000 mg | ORAL_TABLET | Freq: Three times a day (TID) | ORAL | Status: DC

## 2017-06-30 MED ORDER — IPRATROPIUM-ALBUTEROL 0.5-2.5 (3) MG/3ML IN SOLN
3.0000 mL | Freq: Three times a day (TID) | RESPIRATORY_TRACT | Status: DC
  Administered 2017-06-30 – 2017-07-01 (×2): 3 mL via RESPIRATORY_TRACT
  Filled 2017-06-30 (×3): qty 3

## 2017-06-30 MED ORDER — CLONIDINE HCL 0.1 MG/24HR TD PTWK: 0.1000 mg | MEDICATED_PATCH | TRANSDERMAL | Status: DC

## 2017-06-30 MED ORDER — HEPARIN SODIUM (PORCINE) 5000 UNIT/ML IJ SOLN: 5000.0000 [IU] | Freq: Three times a day (TID) | INTRAMUSCULAR | Status: DC

## 2017-06-30 MED ORDER — POTASSIUM CHLORIDE CRYS ER 20 MEQ PO TBCR
40.0000 meq | EXTENDED_RELEASE_TABLET | Freq: Once | ORAL | Status: AC
  Administered 2017-06-30: 40 meq via ORAL
  Filled 2017-06-30: qty 2

## 2017-06-30 MED ORDER — CARVEDILOL 12.5 MG PO TABS
25.0000 mg | ORAL_TABLET | Freq: Two times a day (BID) | ORAL | Status: DC
  Administered 2017-06-30 – 2017-07-01 (×2): 25 mg via ORAL
  Filled 2017-06-30 (×2): qty 2

## 2017-06-30 MED ORDER — FUROSEMIDE 10 MG/ML IJ SOLN
40.0000 mg | Freq: Once | INTRAMUSCULAR | Status: AC
  Administered 2017-06-30: 40 mg via INTRAVENOUS
  Filled 2017-06-30: qty 4

## 2017-06-30 MED ORDER — FUROSEMIDE 20 MG PO TABS
80.0000 mg | ORAL_TABLET | Freq: Two times a day (BID) | ORAL | Status: DC
  Administered 2017-06-30 – 2017-07-01 (×2): 80 mg via ORAL
  Filled 2017-06-30 (×2): qty 4

## 2017-06-30 MED ORDER — ISOSORBIDE MONONITRATE ER 30 MG PO TB24
60.0000 mg | ORAL_TABLET | Freq: Every day | ORAL | Status: DC
  Administered 2017-06-30 – 2017-07-01 (×2): 60 mg via ORAL
  Filled 2017-06-30 (×2): qty 2

## 2017-06-30 MED ORDER — ALBUTEROL SULFATE (2.5 MG/3ML) 0.083% IN NEBU
5.0000 mg | INHALATION_SOLUTION | Freq: Once | RESPIRATORY_TRACT | Status: AC
  Administered 2017-06-30: 5 mg via RESPIRATORY_TRACT
  Filled 2017-06-30: qty 6

## 2017-06-30 MED ORDER — METHYLPREDNISOLONE SODIUM SUCC 125 MG IJ SOLR
125.0000 mg | Freq: Once | INTRAMUSCULAR | Status: AC
  Administered 2017-06-30: 125 mg via INTRAVENOUS
  Filled 2017-06-30: qty 2

## 2017-06-30 MED ORDER — AZITHROMYCIN 250 MG PO TABS
500.0000 mg | ORAL_TABLET | Freq: Every day | ORAL | Status: AC
  Administered 2017-06-30: 500 mg via ORAL
  Filled 2017-06-30: qty 2

## 2017-06-30 MED ORDER — FLUTICASONE PROPIONATE 50 MCG/ACT NA SUSP
1.0000 | Freq: Two times a day (BID) | NASAL | Status: DC
  Administered 2017-06-30 – 2017-07-01 (×2): 1 via NASAL
  Filled 2017-06-30: qty 16

## 2017-06-30 MED ORDER — POLYETHYLENE GLYCOL 3350 17 G PO PACK: 17.0000 g | PACK | Freq: Every day | ORAL | Status: DC | PRN

## 2017-06-30 MED ORDER — AZITHROMYCIN 250 MG PO TABS
250.0000 mg | ORAL_TABLET | Freq: Every day | ORAL | Status: DC
  Administered 2017-07-01: 250 mg via ORAL
  Filled 2017-06-30: qty 1

## 2017-06-30 MED ORDER — GUAIFENESIN-DM 100-10 MG/5ML PO SYRP
5.0000 mL | ORAL_SOLUTION | ORAL | Status: DC | PRN
  Administered 2017-06-30 – 2017-07-01 (×2): 5 mL via ORAL
  Filled 2017-06-30 (×2): qty 5

## 2017-06-30 NOTE — ED Notes (Signed)
attempted IV access and was unsuccessful. Due to pt's lack of IV access and restricted left arm Resident will be using [place] to gain IV access.

## 2017-06-30 NOTE — ED Notes (Signed)
Pt transported to vascular.  °

## 2017-06-30 NOTE — ED Triage Notes (Signed)
Pt presents with sob and productive cough since Saturday; pt states she has a hx of asthma and COPD and has been taking her inhalers and nebulizers without improvement; denies fever

## 2017-06-30 NOTE — Progress Notes (Signed)
*  Preliminary Results* Right lower extremity venous duplex completed. Right lower extremity is negative for deep vein thrombosis. There is no evidence of right Baker's cyst.  06/30/2017 8:59 AM  Gertie Fey, BS, RVT, RDCS, RDMS

## 2017-06-30 NOTE — ED Notes (Signed)
Resident at bedside attempting ultrasound IV

## 2017-06-30 NOTE — Progress Notes (Signed)
Patient is able to place herself on and off of CPAP with no problems.  RT set CPAP at 8 cmH2O because patient stated he home settings are 10 cm H2O and she felt it was to much on the 10 cmH2O.  Patient stated that she is able to tolerate the 8 cmH2O better.     06/30/17 2354  BiPAP/CPAP/SIPAP  BiPAP/CPAP/SIPAP Pt Type Adult  Mask Type Full face mask  Mask Size Medium  Respiratory Rate 18 breaths/min  EPAP 8 cmH2O  Oxygen Percent 21 %  Flow Rate 0 lpm  BiPAP/CPAP/SIPAP CPAP  Patient Home Equipment No  Auto Titrate No  BiPAP/CPAP /SiPAP Vitals  Bilateral Breath Sounds Diminished

## 2017-06-30 NOTE — ED Notes (Signed)
Admit provider at bedside 

## 2017-06-30 NOTE — Consult Note (Signed)
[facility] Consult Note     Date: 06/30/2017                  Patient Name:  Jacqueline Blackburn  MRN: [id number]  DOB: 02-Jul-1975  Age / Sex: 42 y.o., female         PCP: Patient, No Pcp Per                 Service Requesting Consult: ED, Dr. Olen Pel                 Reason for Consult: CKD V            Chief Complaint: shortness of breath HPI: 42 yo female with CKD V, CAD, CHF, asthma, COPD, HTN, h/o stroke, OSA, current smoker who presented with shortness of breath. She states that has had sudden onset shortness of breath over the last 2 days with a nonproductive cough.  No fever/chills. No nausea/vomiting. No dysuria. She has CKD V, follows with Dr. Moshe Cipro, was previously on HD but had continued to have adequate urine output so was taken off HD in February.   Past Medical History:  Diagnosis Date  . Asthma   . CHF (congestive heart failure) ([place])   . COPD (chronic obstructive pulmonary disease) ([place])   . Coronary artery disease   . GERD (gastroesophageal reflux disease)   . Hemodialysis patient ([place])   . History of degenerative disc disease   . Hypertension   . MI (myocardial infarction) ([place]) 2001   Patient reports 16 MIs  . Renal disorder    ESRD  . Sleep apnea   . Stroke [facility]) 2017   "mild stroke/TIA"    Past Surgical History:  Procedure Laterality Date  . ABDOMINAL HYSTERECTOMY    . BACK SURGERY    . CHOLECYSTECTOMY    . FOOT SURGERY Right   . TONSILLECTOMY      History reviewed. No pertinent family history. Social History:  reports that she has been smoking cigarettes.  She has been smoking about 0.25 packs per day. She has never used smokeless tobacco. She reports that she drank alcohol. She reports that she has current or past drug history. Drug: Marijuana.  Allergies:  Allergies  Allergen Reactions  . Lisinopril Other (See Comments)     (Not in a hospital admission)  Results for orders placed or performed during the hospital  encounter of 06/30/17 (from the past 48 hour(s))  Basic metabolic panel     Status: Abnormal   Collection Time: 06/30/17  2:39 AM  Result Value Ref Range   Sodium 139 135 - 145 mmol/L   Potassium 2.8 (L) 3.5 - 5.1 mmol/L   Chloride 101 101 - 111 mmol/L   CO2 25 22 - 32 mmol/L   Glucose, Bld 77 65 - 99 mg/dL   BUN 52 (H) 6 - 20 mg/dL   Creatinine, Ser 4.35 (H) 0.44 - 1.00 mg/dL   Calcium 9.3 8.9 - 10.3 mg/dL   GFR calc non Af Amer 12 (L) >60 mL/min   GFR calc Af Amer 13 (L) >60 mL/min    Comment: (NOTE) The eGFR has been calculated using the CKD EPI equation. This calculation has not been validated in all clinical situations. eGFR's persistently <60 mL/min signify possible Chronic Kidney Disease.    Anion gap 13 5 - 15    Comment: Performed at [facility] [address] [place], [place] [address]  CBC  Status: Abnormal   Collection Time: 06/30/17  2:39 AM  Result Value Ref Range   WBC 10.0 4.0 - 10.5 K/uL   RBC 4.07 3.87 - 5.11 MIL/uL   Hemoglobin 12.0 12.0 - 15.0 g/dL   HCT 36.7 36.0 - 46.0 %   MCV 90.2 78.0 - 100.0 fL   MCH 29.5 26.0 - 34.0 pg   MCHC 32.7 30.0 - 36.0 g/dL   RDW 16.0 (H) 11.5 - 15.5 %   Platelets 252 150 - 400 K/uL    Comment: Performed at [facility] [address] [place], [place] [address]  Brain natriuretic peptide     Status: Abnormal   Collection Time: 06/30/17  2:39 AM  Result Value Ref Range   B Natriuretic Peptide 427.5 (H) 0.0 - 100.0 pg/mL    Comment: Performed at [facility] [address], [place], [place] [address]  I-stat troponin, ED     Status: Abnormal   Collection Time: 06/30/17  2:50 AM  Result Value Ref Range   Troponin i, poc 0.19 (HH) 0.00 - 0.08 ng/mL   Comment NOTIFIED PHYSICIAN    Comment 3            Comment: Due to the release kinetics of cTnI, a negative result within the first hours of the onset of symptoms does not rule out myocardial infarction with certainty. If myocardial infarction is  still suspected, repeat the test at appropriate intervals.   I-Stat beta hCG blood, ED     Status: None   Collection Time: 06/30/17  2:50 AM  Result Value Ref Range   I-stat hCG, quantitative <5.0 <5 mIU/mL   Comment 3            Comment:   GEST. AGE      CONC.  (mIU/mL)   <=1 WEEK        5 - 50     2 WEEKS       50 - 500     3 WEEKS       100 - 10,000     4 WEEKS     1,000 - 30,000        FEMALE AND NON-PREGNANT FEMALE:     LESS THAN 5 mIU/mL    Dg Chest 2 View  Result Date: 06/30/2017 CLINICAL DATA:  Chest discomfort, dyspnea and cough EXAM: CHEST - 2 VIEW COMPARISON:  04/12/2017 FINDINGS: The heart size and mediastinal contours are within normal limits. Mild pulmonary vascular congestion. Both lungs are clear. The visualized skeletal structures are unremarkable. IMPRESSION: Mild vascular congestion similar to prior. No pulmonary consolidation, effusion or pneumothorax. Electronically Signed   By: Ashley Royalty M.D.   On: 06/30/2017 03:55    Review of Systems  Constitutional: Negative for chills and fever.  HENT: Negative for congestion and sore throat.   Respiratory: Positive for cough, shortness of breath and wheezing. Negative for sputum production.   Cardiovascular: Negative for chest pain and leg swelling.  Gastrointestinal: Negative for abdominal pain, heartburn, nausea and vomiting.  Genitourinary: Negative for dysuria, frequency and urgency.  Neurological: Negative for headaches.    Blood pressure (!) 167/105, pulse 87, temperature 98 F (36.7 C), temperature source Oral, resp. rate 19, height 5' 7"  (1.702 m), weight 232 lb (105.2 kg), SpO2 96 %. Physical Exam  Constitutional: She is oriented to person, place, and time. She appears well-developed and well-nourished. No distress.  HENT:  Head: Normocephalic and atraumatic.  Eyes: Conjunctivae  are normal.  Neck: Normal range of motion.  Cardiovascular: Normal rate, regular rhythm and normal heart sounds.  No murmur  heard. Respiratory: No respiratory distress. She has wheezes (diffusely).  No crackles. Mildly increased work of breathing  GI: Soft. Bowel sounds are normal. She exhibits no distension. There is no tenderness. There is no rebound and no guarding.  Musculoskeletal: Normal range of motion. She exhibits no edema.  Neurological: She is alert and oriented to person, place, and time.  Skin: Skin is warm and dry.     Assessment/Plan 42 yo female with CKD V, CAD, CHF, asthma, COPD, HTN, h/o stroke, OSA, current smoker who presented with shortness of breath. She was previously on dialysis but was thought to be recovering renal function so was subsequently taken off HD in February.  1. Shortness of breath: per primary. Neg for DVT. ED with some concern for PE.  2. CKD V. No indication for HD. Monitor electrolytes and kidney function.  3. Hypokalemia K 2.8, monitor. 4. HTN, elevated. Per primary  Bufford Lope, DO PGY-2, [facility] 06/30/2017 9:48 AM  ;

## 2017-06-30 NOTE — ED Notes (Signed)
Dialysis doctor at bedside.

## 2017-06-30 NOTE — ED Provider Notes (Signed)
[facility] EMERGENCY DEPARTMENT Provider Note   CSN: [id number] Arrival date & time: 06/30/17  0218     History   Chief Complaint Chief Complaint  Patient presents with  . Shortness of Breath    HPI Jacqueline Blackburn is a 42 y.o. female.  HPI   Patient is a 42 year old female the past medical history of CAD, CHF, ESRD who was previously on dialysis for approximately 8 years prior to stopping at the direction of her nephrologist in February, COPD as well as hypertension who comes to the ED today complaining of cough, shortness of breath, congestion.  Patient states cough is productive of white sputum.  Patient endorses chills at home but has not measured any fevers.  Patient also endorses left-sided chest pain only present when coughing.  Patient denies any sick contacts, however patient had recent hospitalization in [place] from which she left AMA for chest pain as well as nausea and vomiting.  Patient states that she was told at that time that she was going to be required to resume her dialysis.  Patient denies any alleviating or aggravating factors of her chest pain, denies radiation.  Patient endorses lower extremity edema right greater than left which she states that she thought was "from riding on the bus."  Patient denies any nausea or vomiting or dysuria.  Patient had an extensive work-up while at outside hospital which was largely unremarkable.  Past Medical History:  Diagnosis Date  . Asthma   . CHF (congestive heart failure) (HCC)   . COPD (chronic obstructive pulmonary disease) (HCC)   . Coronary artery disease   . GERD (gastroesophageal reflux disease)   . Hemodialysis patient (HCC)   . History of degenerative disc disease   . Hypertension   . MI (myocardial infarction) (HCC) 2001   Patient reports 16 MIs  . Renal disorder    ESRD  . Sleep apnea   . Stroke [facility]) 2017   "mild stroke/TIA"    Patient Active Problem List   Diagnosis Date Noted  .  Dyspnea 06/30/2017  . Chest pain 04/15/2017  . Leg pain, left 04/15/2017  . ESRD (end stage renal disease) on dialysis (HCC) 04/12/2017    Past Surgical History:  Procedure Laterality Date  . ABDOMINAL HYSTERECTOMY    . BACK SURGERY    . CHOLECYSTECTOMY    . FOOT SURGERY Right   . TONSILLECTOMY       OB History   None      Home Medications    Prior to Admission medications   Medication Sig Start Date End Date Taking? Authorizing Provider  albuterol (PROVENTIL HFA;VENTOLIN HFA) 108 (90 Base) MCG/ACT inhaler Inhale 2 puffs into the lungs every 4 (four) hours as needed for wheezing or shortness of breath.   Yes [provider]  albuterol (PROVENTIL) (2.5 MG/3ML) 0.083% nebulizer solution Take 2.5 mg by nebulization every 6 (six) hours as needed for wheezing or shortness of breath.   Yes [provider]  aspirin EC 81 MG tablet Take 81 mg by mouth daily.   Yes [provider]  aspirin-acetaminophen-caffeine (EXCEDRIN MIGRAINE) [phone] MG tablet Take 2 tablets by mouth every 6 (six) hours as needed for headache.   Yes [provider]  carvedilol (COREG) 25 MG tablet Take 25 mg by mouth 2 (two) times daily with a meal.   Yes [provider]  cloNIDine (CATAPRES - DOSED IN MG/24 HR) 0.1 mg/24hr patch Place 0.1 mg onto the skin once  a week.   Yes [provider]  cloNIDine (CATAPRES - DOSED IN MG/24 HR) 0.3 mg/24hr patch Place 0.3 mg onto the skin once a week.   Yes [provider]  fluticasone (FLONASE) 50 MCG/ACT nasal spray Place 1 spray into both nostrils 2 (two) times daily.   Yes [provider]  furosemide (LASIX) 80 MG tablet Take 2 tablets (160 mg total) by mouth as needed (For shortness or breath or weight gain over 3-5 lbs). 04/14/17  Yes Reymundo Poll, MD  hydrALAZINE (APRESOLINE) 100 MG tablet Take 100 mg by mouth 3 (three) times daily.   Yes [provider]  isosorbide mononitrate (IMDUR)  60 MG 24 hr tablet Take 60 mg by mouth daily.   Yes [provider]  meclizine (ANTIVERT) 25 MG tablet Take 25 mg by mouth every 8 (eight) hours as needed for dizziness.   Yes [provider]  mometasone-formoterol (DULERA) 200-5 MCG/ACT AERO Inhale 2 puffs into the lungs 2 (two) times daily. 04/14/17  Yes Reymundo Poll, MD  NIFEdipine (PROCARDIA-XL/ADALAT CC) 60 MG 24 hr tablet Take 60 mg by mouth 2 (two) times daily.   Yes [provider]  nitroGLYCERIN (NITROSTAT) 0.4 MG SL tablet Place 0.4 mg under the tongue every 5 (five) minutes as needed for chest pain.   Yes [provider]  ondansetron (ZOFRAN) 4 MG tablet Take 4 mg by mouth every 8 (eight) hours as needed for nausea or vomiting.   Yes [provider]  sodium bicarbonate 650 MG tablet Take 1 tablet (650 mg total) by mouth 3 (three) times daily. 04/14/17  Yes Reymundo Poll, MD    Family History History reviewed. No pertinent family history.  Social History Social History   Tobacco Use  . Smoking status: Current Every Day Smoker    Packs/day: 0.25    Types: Cigarettes  . Smokeless tobacco: Never Used  Substance Use Topics  . Alcohol use: Not Currently    Frequency: Never  . Drug use: Yes    Types: Marijuana     Allergies   Lisinopril   Review of Systems Review of Systems  Constitutional: Positive for chills. Negative for fever.  HENT: Positive for congestion.   Respiratory: Positive for cough and wheezing.   Cardiovascular: Positive for chest pain and leg swelling.  Gastrointestinal: Negative for abdominal distention, abdominal pain, nausea and vomiting.  Genitourinary: Negative for dysuria.  All other systems reviewed and are negative.    Physical Exam Updated Vital Signs BP (!) 173/108 (BP Location: Right Arm)   Pulse 85   Temp 98 F (36.7 C) (Oral)   Resp 18   Ht  (1.702 m)   Wt 105.2 kg (232 lb)   SpO2 97%   BMI 36.34 kg/m   Physical Exam    Constitutional: She appears well-developed and well-nourished. No distress.  HENT:  Head: Normocephalic and atraumatic.  Eyes: Conjunctivae are normal.  Neck: Neck supple.  Cardiovascular: Regular rhythm.  Murmur heard. Slight tachycardia  Pulmonary/Chest: Effort normal. No respiratory distress. She has wheezes. She has no rhonchi.  Abdominal: Soft. There is no tenderness.  Musculoskeletal: Normal range of motion.       Right lower leg: She exhibits edema.       Left lower leg: She exhibits edema.  Neurological: She is alert.  Skin: Skin is warm and dry.  Psychiatric: She has a normal mood and affect.  Nursing note and vitals reviewed.    ED Treatments /  Results  Labs (all labs ordered are listed, but only abnormal results are displayed) Labs Reviewed  BASIC METABOLIC PANEL - Abnormal; Notable for the following components:      Result Value   Potassium 2.8 (*)    BUN 52 (*)    Creatinine, Ser 4.35 (*)    GFR calc non Af Amer 12 (*)    GFR calc Af Amer 13 (*)    All other components within normal limits  CBC - Abnormal; Notable for the following components:   RDW 16.0 (*)    All other components within normal limits  BRAIN NATRIURETIC PEPTIDE - Abnormal; Notable for the following components:   B Natriuretic Peptide 427.5 (*)    All other components within normal limits  HEPATIC FUNCTION PANEL - Abnormal; Notable for the following components:   Albumin 3.3 (*)    ALT 11 (*)    Bilirubin, Direct <0.1 (*)    All other components within normal limits  LIPASE, BLOOD - Abnormal; Notable for the following components:   Lipase 85 (*)    All other components within normal limits  I-STAT TROPONIN, ED - Abnormal; Notable for the following components:   Troponin i, poc 0.19 (*)    All other components within normal limits  I-STAT TROPONIN, ED - Abnormal; Notable for the following components:   Troponin i, poc 0.12 (*)    All other components within normal limits  PROTIME-INR   MAGNESIUM  I-STAT BETA HCG BLOOD, ED ([facility], [facility], [facility] ONLY)    EKG EKG Interpretation  Date/Time:  Tuesday Jun 30 2017 02:28:06 EDT Ventricular Rate:  89 PR Interval:  190 QRS Duration: 98 QT Interval:  446 QTC Calculation: 542 R Axis:   4 Text Interpretation:  Normal sinus rhythm Possible Left atrial enlargement Left ventricular hypertrophy with repolarization abnormality Prolonged QT Abnormal ECG No significant change since last tracing Confirmed by Richardean Canal [phone]) on 06/30/2017 7:38:53 AM   Radiology Dg Chest 2 View  Result Date: 06/30/2017 CLINICAL DATA:  Chest discomfort, dyspnea and cough EXAM: CHEST - 2 VIEW COMPARISON:  04/12/2017 FINDINGS: The heart size and mediastinal contours are within normal limits. Mild pulmonary vascular congestion. Both lungs are clear. The visualized skeletal structures are unremarkable. IMPRESSION: Mild vascular congestion similar to prior. No pulmonary consolidation, effusion or pneumothorax. Electronically Signed   By: Tollie Eth M.D.   On: 06/30/2017 03:55    Procedures Procedures (including critical care time)  Medications Ordered in ED Medications  albuterol (PROVENTIL) (2.5 MG/3ML) 0.083% nebulizer solution 2.5 mg (has no administration in time range)  aspirin EC tablet 81 mg (has no administration in time range)  carvedilol (COREG) tablet 25 mg (has no administration in time range)  cloNIDine (CATAPRES - Dosed in mg/24 hr) patch 0.1 mg (has no administration in time range)  fluticasone (FLONASE) 50 MCG/ACT nasal spray 1 spray (has no administration in time range)  isosorbide mononitrate (IMDUR) 24 hr tablet 60 mg (60 mg Oral Given 06/30/17 1615)  mometasone-formoterol (DULERA) 200-5 MCG/ACT inhaler 2 puff (has no administration in time range)  heparin injection 5,000 Units (has no administration in time range)  acetaminophen (TYLENOL) tablet 650 mg (has no administration in time range)    Or  acetaminophen (TYLENOL) suppository 650 mg  (has no administration in time range)  polyethylene glycol (MIRALAX / GLYCOLAX) packet 17 g (has no administration in time range)  predniSONE (DELTASONE) tablet 40 mg (has no administration in time range)  azithromycin (ZITHROMAX)  tablet 500 mg (has no administration in time range)    Followed by  azithromycin (ZITHROMAX) tablet 250 mg (has no administration in time range)  furosemide (LASIX) tablet 80 mg (has no administration in time range)  hydrALAZINE (APRESOLINE) tablet 100 mg (100 mg Oral Given 06/30/17 1615)  ipratropium-albuterol (DUONEB) 0.5-2.5 (3) MG/3ML nebulizer solution 3 mL (has no administration in time range)  NIFEdipine (PROCARDIA-XL/ADALAT CC) 24 hr tablet 60 mg (60 mg Oral Given 06/30/17 1617)  potassium chloride SA (K-DUR,KLOR-CON) CR tablet 40 mEq (40 mEq Oral Given 06/30/17 0928)  aspirin tablet 325 mg (325 mg Oral Given 06/30/17 0934)  ondansetron (ZOFRAN) injection 4 mg (4 mg Intravenous Given 06/30/17 0956)  methylPREDNISolone sodium succinate (SOLU-MEDROL) 125 mg/2 mL injection 125 mg (125 mg Intravenous Given 06/30/17 0955)  albuterol (PROVENTIL) (2.5 MG/3ML) 0.083% nebulizer solution 5 mg (5 mg Nebulization Given 06/30/17 0929)  ipratropium (ATROVENT) nebulizer solution 0.5 mg (0.5 mg Nebulization Given 06/30/17 0929)  furosemide (LASIX) injection 40 mg (40 mg Intravenous Given 06/30/17 0956)  potassium chloride SA (K-DUR,KLOR-CON) CR tablet 40 mEq (40 mEq Oral Given 06/30/17 1616)     Initial Impression / Assessment and Plan / ED Course  I have reviewed the triage vital signs and the nursing notes.  Pertinent labs & imaging results that were available during my care of the patient were reviewed by me and considered in my medical decision making (see chart for details).     Physical exam: Patient with decreased air movement as well as inspiratory and expiratory wheezes bilaterally.  Patient with 3 out of 6 systolic murmur on cardiac auscultation.  Patient with mild edema of the  left lower extremity with moderate edema in the right lower extremity.  Patient denies any tenderness to palpation.  Remainder physical exam within normal limits.  Patient's laboratory work-up shows positive troponin.  Of note, patient's troponin at outside hospital was negative.  Work-up additionally reveals hypokalemia.  Chest x-ray shows vascular congestion. Awaiting results of DVT study, Pt w/ elevated Cr consistent with prior.  Patient given nebulizer treatment with significant improvement in her respiratory status, however given patient's troponin, will admit patient for further evaluation and care.  Final Clinical Impressions(s) / ED Diagnoses   Final diagnoses:  Elevated troponin    ED Discharge Orders    None       Caren Griffins, MD 06/30/17 1647    Charlynne Pander, MD 07/01/17 [phone]

## 2017-06-30 NOTE — H&P (Signed)
Date: 06/30/2017               Patient Name:  Jacqueline Blackburn MRN: [id number]  DOB: [birth year] Age / Sex: 42 y.o., female   PCP: Patient, No Pcp Per         Medical Service: Internal Medicine Teaching Service         Attending Physician: Dr. Doneen Poisson, MD    First Contact: Dr. Renaldo Reel Pager: [phone]  Second Contact: Dr. Antony Contras Pager: [phone]       After Hours (After 5p/  First Contact Pager: [phone]  weekends / holidays): Second Contact Pager: [phone]   Chief Complaint: Cough and SOB  History of Present Illness: This is a 42 y.o. woman with PMHx of non-obstructive CAD, HFpEF (echo 03/2017 with EF 65-70%, G1DD), ESRD previously on HD, OSA non-adherent with CPAP, COPD/asthma, tobacco use, GERD, HTN who presents to the ED today with cough occasionally productive of clear sputum and shortness of breath.  She also endorses left sided chest pain worse with cough and deep breaths.  She reports her symptoms began about Saturday with symptoms of sneezing and congestion.  This has progressed to include worsening DOE, orthopnea, PND, and mild lower extremity edema.  She reports chills and sick contacts at home.  Her chest pain is not aggravated by exertion.  Of note, she had an admission recently at [facility] in [place] from 4/23-4/26 for chest pain, vomiting, diarrhea, and CKD (Creatinine was in the 4's during hospitalization).  Her chest pain was felt to be MSK related vs GI.  Per review of records, VQ scan was negative for PE.  Cardiology consulted during that admission with no further work up recommended.  Cardiac cath (report not readily available) from 05/2015 noted to have non-obstructive CAD.  Nephrology also consulted that admission who recommended resuming HD to which patient declined.  She ultimately left that hospital AMA per review of the notes from [facility].  Also of note, patient had been on HD secondary to ESRD from HTN for about 8 years that was recently stopped in February  of this year by her nephrologist.  She reports that she continues to make urine.  ED Course: Nephrology consulted, she was given breathing treatments, steroids, and potassium supplementation. She was worked up for DVT/PE. IMTS was called to admit for further care.  Labs: Na 139, K 2.8, Cl 101, CO2 25, Glucose 77, BUN 52, Creatinine 4.35 (appears close to baseline), GFR 13, anion gap 13. CBC unremarkable BNP 427 Lipase 85 I-stat troponin 0.19>>0.12 CXR with some vascular congestion, no evidence of consolidation. LE DVT studies in the ED were negative for DVT.   Meds:  Current Meds  Medication Sig  . albuterol (PROVENTIL HFA;VENTOLIN HFA) 108 (90 Base) MCG/ACT inhaler Inhale 2 puffs into the lungs every 4 (four) hours as needed for wheezing or shortness of breath.  Marland Kitchen albuterol (PROVENTIL) (2.5 MG/3ML) 0.083% nebulizer solution Take 2.5 mg by nebulization every 6 (six) hours as needed for wheezing or shortness of breath.  Marland Kitchen aspirin EC 81 MG tablet Take 81 mg by mouth daily.  Marland Kitchen aspirin-acetaminophen-caffeine (EXCEDRIN MIGRAINE) 250-250-65 MG tablet Take 2 tablets by mouth every 6 (six) hours as needed for headache.  . carvedilol (COREG) 25 MG tablet Take 25 mg by mouth 2 (two) times daily with a meal.  . cloNIDine (CATAPRES - DOSED IN MG/24 HR) 0.1 mg/24hr patch Place 0.1 mg onto the skin once a week.  . cloNIDine (CATAPRES -  DOSED IN MG/24 HR) 0.3 mg/24hr patch Place 0.3 mg onto the skin once a week.  . fluticasone (FLONASE) 50 MCG/ACT nasal spray Place 1 spray into both nostrils 2 (two) times daily.  . furosemide (LASIX) 80 MG tablet Take 2 tablets (160 mg total) by mouth as needed (For shortness or breath or weight gain over 3-5 lbs).  . hydrALAZINE (APRESOLINE) 100 MG tablet Take 100 mg by mouth 3 (three) times daily.  . isosorbide mononitrate (IMDUR) 60 MG 24 hr tablet Take 60 mg by mouth daily.  . meclizine (ANTIVERT) 25 MG tablet Take 25 mg by mouth every 8 (eight) hours as needed for  dizziness.  . mometasone-formoterol (DULERA) 200-5 MCG/ACT AERO Inhale 2 puffs into the lungs 2 (two) times daily.  Marland Kitchen NIFEdipine (PROCARDIA-XL/ADALAT CC) 60 MG 24 hr tablet Take 60 mg by mouth 2 (two) times daily.  . nitroGLYCERIN (NITROSTAT) 0.4 MG SL tablet Place 0.4 mg under the tongue every 5 (five) minutes as needed for chest pain.  Marland Kitchen ondansetron (ZOFRAN) 4 MG tablet Take 4 mg by mouth every 8 (eight) hours as needed for nausea or vomiting.  . sodium bicarbonate 650 MG tablet Take 1 tablet (650 mg total) by mouth 3 (three) times daily.     Allergies: Allergies as of 06/30/2017 - Review Complete 06/30/2017  Allergen Reaction Noted  . Lisinopril Other (See Comments) 04/12/2017   Past Medical History:  Diagnosis Date  . Asthma   . CHF (congestive heart failure) (HCC)   . COPD (chronic obstructive pulmonary disease) (HCC)   . Coronary artery disease   . GERD (gastroesophageal reflux disease)   . Hemodialysis patient (HCC)   . History of degenerative disc disease   . Hypertension   . MI (myocardial infarction) (HCC) 2001   Patient reports 16 MIs  . Renal disorder    ESRD  . Sleep apnea   . Stroke [facility]) 2017   "mild stroke/TIA"    Family History: Maternal grandmother with HTN, DM, MI, cancer.  Maternal grandfather with HTN, COPD, CVA, and Cancer.  Paternal grandmother with HTN, DM, cancer.  Her mother's passed away at 7 and she does not know her father's PMHx.  Social History: Living currently in [place] with plans to relocate to [place], [place] soon.  She is disabled from her past medical problems.  She is divorced with no children.  She continues to smoke about 1/4 PPD x 30 years.  Denies EtOH.  Endorses marijuana use.  Review of Systems: A complete ROS was negative except as per HPI.   Physical Exam: Blood pressure (!) 173/108, pulse 85, temperature 98 F (36.7 C), temperature source Oral, resp. rate 18, height  (1.702 m), weight 232 lb (105.2 kg), SpO2 94 %. Physical  Exam  Constitutional: She is oriented to person, place, and time and well-developed, well-nourished, and in no distress.  Pleasant and cooperative with the exam.  HENT:  Head: Normocephalic and atraumatic.  Mouth/Throat: No oropharyngeal exudate.  Eyes: Pupils are equal, round, and reactive to light. Conjunctivae and EOM are normal.  Neck: Normal range of motion. Neck supple.  Cardiovascular: Normal rate and regular rhythm.  No murmur heard. Pulmonary/Chest: Effort normal. She has wheezes. She has rales.  She has coarse breath sounds throughout bilateral wheezing.  She has fine crackles bilaterally in the bases.  Abdominal: Soft. There is no tenderness. There is no rebound and no guarding.  Musculoskeletal: Normal range of motion. She exhibits edema.  She has trace to  1+ edema bilaterally. She has a Left arm AV graft with good thrill.   Neurological: She is alert and oriented to person, place, and time. No cranial nerve deficit. Coordination normal.  Skin: Skin is warm and dry.  Psychiatric: Mood and affect normal.     EKG: personally reviewed my interpretation is HR 89, non-ischemic, NSR, LVH, prolonged QT, and likely left atrial enlargement  CXR: personally reviewed my interpretation is mild vascular congestion without consolidation.  Assessment & Plan by Problem:  42 yo woman with PMHx COPD, CKD V, non-obstructive CAD, HTN, GERD presenting with dyspnea and cough for 3 days.  # Dyspnea likely due to COPD Exacerbation She is presenting with three days of worsening dyspnea, increased cough, and sputum production consistent with a mild COPD exacerbation likely precipitated by her worsening allergy symptoms last week versus viral URI.  She is currently saturating in the 90's on room air without evidence of respiratory compromise.  Her BNP is elevated at 427, but she does not appear markedly volume overloaded to suggest that her symptoms are being driven by heart failure or kidney disease.   She was given  of IV Lasix in the ED.  She continues to smoke about 1/4 pack per day of cigarettes which has been ongoing since her teenage years.  I have a low threshold for DVT/PE in her with LE dopplers obtained through the ED being negative for DVT and a negative V/Q scan at her hospitalization in [place] recently.  Her Wells score for PE is also 0. - Given the presence of her cardinal symptoms will treat with prednisone  daily and azithromycin for 5 days.  She received Solumedrol in the ED. - Scheduled Duonebs - As needed Albuterol nebs - Continue home Dulera and Flonase - Ambulate in the morning with pulse oximetry  # CKD # Hypokalemia She was previously on HD for 8 years due to HTN per her report.  She continued to make urine and her HD was stopped in February 2019.  She continues to make urine and has a left upper extremity fistula with good thrill.  Her creatinine is currently near baseline.  Given her CXR findings and elevated BNP, she may have some fluid overload. She is prescribed Lasix  BID per discharge notes from recent hospital stay.  Her potassium was 2.8 and she was given in the ED. - Nephrology consulted via the emergency department.  Recommendations appreciated - Continue Lasix  PO BID - Daily renal function panel - of KDur re-ordered  # HFpEF ## CAD ### HTN Her most recent Echo was in Feb 2019 with preserved EF and grade 1 diastolic dysfunction.  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  She was hypertensive in the ED.  Home medications include Lasix, Coreg, Hydralazine, Clonidine patch, Imdur, Nicardipine.  It appears she was receiving all these medicatons during recent hospitalization as well.  - Lasix as above - ASA  daily - Coreg  BID - Hydralazine  TID - Nifedipine  BID - Clonidine patch replaced every Friday -  Imdur  daily  # Elevated troponin with atypical chest pain associated with cough and deep inspiration Likely demand in the setting of COPD exacerbation and chronic renal failure that has trended down on repeat measurement. - No indication for further measurements at this time.  # OSA Non adherent with CPAP at home. -  CPAP QHS  # FEN Fluids: None Electrolytes: Replacing potassium.  Continue to monitor Nutrition: Renal diet  # DVT PPx: Heparin SQ  # CODE: FULL    Dispo: Admit patient to Inpatient with expected length of stay greater than 2 midnights.  SignedGwynn Burly, DO 06/30/2017, 3:03 PM  Pager: [phone]

## 2017-07-01 DIAGNOSIS — G4733 Obstructive sleep apnea (adult) (pediatric): Secondary | ICD-10-CM

## 2017-07-01 DIAGNOSIS — F17200 Nicotine dependence, unspecified, uncomplicated: Secondary | ICD-10-CM

## 2017-07-01 DIAGNOSIS — Z9119 Patient's noncompliance with other medical treatment and regimen: Secondary | ICD-10-CM

## 2017-07-01 DIAGNOSIS — I2510 Atherosclerotic heart disease of native coronary artery without angina pectoris: Secondary | ICD-10-CM

## 2017-07-01 DIAGNOSIS — Z7982 Long term (current) use of aspirin: Secondary | ICD-10-CM

## 2017-07-01 DIAGNOSIS — Z888 Allergy status to other drugs, medicaments and biological substances status: Secondary | ICD-10-CM

## 2017-07-01 DIAGNOSIS — I5032 Chronic diastolic (congestive) heart failure: Secondary | ICD-10-CM

## 2017-07-01 DIAGNOSIS — I132 Hypertensive heart and chronic kidney disease with heart failure and with stage 5 chronic kidney disease, or end stage renal disease: Secondary | ICD-10-CM

## 2017-07-01 DIAGNOSIS — I248 Other forms of acute ischemic heart disease: Secondary | ICD-10-CM

## 2017-07-01 DIAGNOSIS — J441 Chronic obstructive pulmonary disease with (acute) exacerbation: Principal | ICD-10-CM

## 2017-07-01 DIAGNOSIS — Z79899 Other long term (current) drug therapy: Secondary | ICD-10-CM

## 2017-07-01 DIAGNOSIS — E876 Hypokalemia: Secondary | ICD-10-CM

## 2017-07-01 DIAGNOSIS — I252 Old myocardial infarction: Secondary | ICD-10-CM

## 2017-07-01 DIAGNOSIS — N186 End stage renal disease: Secondary | ICD-10-CM

## 2017-07-01 LAB — RENAL FUNCTION PANEL
ALBUMIN: 3.1 g/dL — AB (ref 3.5–5.0)
ANION GAP: 14 (ref 5–15)
BUN: 54 mg/dL — ABNORMAL HIGH (ref 6–20)
CALCIUM: 9 mg/dL (ref 8.9–10.3)
CO2: 20 mmol/L — ABNORMAL LOW (ref 22–32)
Chloride: 105 mmol/L (ref 101–111)
Creatinine, Ser: 4.28 mg/dL — ABNORMAL HIGH (ref 0.44–1.00)
GFR, EST AFRICAN AMERICAN: 14 mL/min — AB (ref >60)
GFR, EST NON AFRICAN AMERICAN: 12 mL/min — AB (ref >60)
Glucose, Bld: 102 mg/dL — ABNORMAL HIGH (ref 65–99)
PHOSPHORUS: 2.3 mg/dL — AB (ref 2.5–4.6)
POTASSIUM: 3.4 mmol/L — AB (ref 3.5–5.1)
SODIUM: 139 mmol/L (ref 135–145)

## 2017-07-01 MED ORDER — CARVEDILOL 25 MG PO TABS: 25.0000 mg | ORAL_TABLET | Freq: Two times a day (BID) | ORAL | 0 refills | Status: AC

## 2017-07-01 MED ORDER — PREDNISONE 20 MG PO TABS: 40.0000 mg | ORAL_TABLET | Freq: Every day | ORAL | 0 refills | Status: DC

## 2017-07-01 MED ORDER — AZITHROMYCIN 250 MG PO TABS: 250.0000 mg | ORAL_TABLET | Freq: Every day | ORAL | 0 refills | Status: AC

## 2017-07-01 MED ORDER — FUROSEMIDE 80 MG PO TABS: 80.0000 mg | ORAL_TABLET | Freq: Two times a day (BID) | ORAL | 0 refills | Status: AC

## 2017-07-01 NOTE — Progress Notes (Signed)
MD upadated of BP this am 170/110. Pt is asymptomatic. No new orders given. Will continue   07/01/17 0444  Vitals  Temp 98.1 F (36.7 C)  Temp Source Oral  BP (!) 170/110  MAP (mmHg) 127  BP Location Right Arm  BP Method Automatic  Patient Position (if appropriate) Lying  Pulse Rate 78  Pulse Rate Source Monitor  Resp 18  Oxygen Therapy  SpO2 100 %  O2 Device CPAP   to monitor pt.

## 2017-07-01 NOTE — Discharge Summary (Signed)
Name: Jacqueline Blackburn MRN: [id number] DOB: 04-22-1975 42 y.o. PCP: Patient, No Pcp Per  Date of Admission: 06/30/2017  2:24 AM Date of Discharge: 07/01/2017 Attending Physician: Doneen Poisson, MD  Discharge Diagnosis: 1. COPD exacerbation 2. ESRD not on HD 3. OSA  Discharge Medications: Allergies as of 07/01/2017      Reactions   Lisinopril Other (See Comments)      Medication List    TAKE these medications   albuterol 108 (90 Base) MCG/ACT inhaler Commonly known as:  PROVENTIL HFA;VENTOLIN HFA Inhale 2 puffs into the lungs every 4 (four) hours as needed for wheezing or shortness of breath.   albuterol (2.5 MG/3ML) 0.083% nebulizer solution Commonly known as:  PROVENTIL Take 2.5 mg by nebulization every 6 (six) hours as needed for wheezing or shortness of breath.   aspirin EC 81 MG tablet Take 81 mg by mouth daily.   aspirin-acetaminophen-caffeine 250-250-65 MG tablet Commonly known as:  EXCEDRIN MIGRAINE Take 2 tablets by mouth every 6 (six) hours as needed for headache.   azithromycin 250 MG tablet Commonly known as:  ZITHROMAX Take 1 tablet (250 mg total) by mouth daily for 3 days. Start taking on:  07/02/2017   carvedilol 25 MG tablet Commonly known as:  COREG Take 1 tablet (25 mg total) by mouth 2 (two) times daily with a meal.   cloNIDine 0.3 mg/24hr patch Commonly known as:  CATAPRES - Dosed in mg/24 hr Place 0.3 mg onto the skin once a week.   cloNIDine 0.1 mg/24hr patch Commonly known as:  CATAPRES - Dosed in mg/24 hr Place 0.1 mg onto the skin once a week.   fluticasone 50 MCG/ACT nasal spray Commonly known as:  FLONASE Place 1 spray into both nostrils 2 (two) times daily.   furosemide 80 MG tablet Commonly known as:  LASIX Take 1 tablet (80 mg total) by mouth 2 (two) times daily. What changed:    how much to take  when to take this  reasons to take this   hydrALAZINE 100 MG tablet Commonly known as:  APRESOLINE Take 100 mg by mouth 3  (three) times daily.   isosorbide mononitrate 60 MG 24 hr tablet Commonly known as:  IMDUR Take 60 mg by mouth daily.   meclizine 25 MG tablet Commonly known as:  ANTIVERT Take 25 mg by mouth every 8 (eight) hours as needed for dizziness.   mometasone-formoterol 200-5 MCG/ACT Aero Commonly known as:  DULERA Inhale 2 puffs into the lungs 2 (two) times daily.   NIFEdipine 60 MG 24 hr tablet Commonly known as:  PROCARDIA-XL/ADALAT CC Take 60 mg by mouth 2 (two) times daily.   nitroGLYCERIN 0.4 MG SL tablet Commonly known as:  NITROSTAT Place 0.4 mg under the tongue every 5 (five) minutes as needed for chest pain.   ondansetron 4 MG tablet Commonly known as:  ZOFRAN Take 4 mg by mouth every 8 (eight) hours as needed for nausea or vomiting.   predniSONE 20 MG tablet Commonly known as:  DELTASONE Take 2 tablets (40 mg total) by mouth daily with breakfast for 3 days. Start taking on:  07/02/2017   sodium bicarbonate 650 MG tablet Take 1 tablet (650 mg total) by mouth 3 (three) times daily.       Disposition and follow-up:   [name] was discharged from [facility] in Stable condition.  At the hospital follow up visit please address:  1.  COPD exacerbation - Please assess for resolution of  symptoms. Was she able to complete azithromycin and steroid course (received 3 days on discharge to complete a 5 day course)  2.  HFpEF - Recent admission for HF exacerbation. On lasix  BID - please reassess volume status.  3.  OSA - Please help with getting CPAP machine.  4.  Continue to manage HTN  5.  Labs / imaging needed at time of follow-up: None  6.  Pending labs/ test needing follow-up: None  Follow-up Appointments:   Hospital Course by problem list: Active Problems:   Dyspnea   1. COPD exacerbation Presented with three days of worsened dyspnea, increased cough, and sputum production, consistent with mild COPD exacerbation likely precipitated  by worsening allergy symptoms vs viral URI. BNP elevated at 427, but she did not appear markedly volume overloaded, suggesting that her symptoms were less likely being driven by heart failure or kidney disease. LE dopplers negative for DVT and V/Q scan during her recent hospitalization in [place] was negative. She did get 1 dose of IV lasix and was managed with steroids, azithromycin, and breathing treatments. Troponins peaked at 0.19, likely secondary to demand in the setting of COPD exacerbation. She had rapid improvement in her symptoms the following morning. She was discharged to complete a 5-day total course of steroids and azithromycin. She stated she had an appointment scheduled with a PCP.  2. ESRD not on HD She was previously on HD for 8 years due to HTN per her report. She continued to make urine and her HD was stopped in February 2019. She continues to make urine and has a left upper extremity fistula with good thrill. Her creatinine was near baseline. She is prescribed lasix  BID per discharge note from recent hospital stay in [place]. Hypokalemia was repleted with PO potassium and she was continued on her home diuretic regimen. Nephrology was consulted and recommended no need for inpatient dialysis.  3. OSA Nonadherent with CPAP at home. She did use the CPAP machine while inpatient and stated feeling very well-rested the following morning. Encouraged her to continue follow up with PCP to obtain outpatient CPAP machine.  4. Hx of HTN, HFpEF, CAD Her most recent Echo was in Feb 2019 with preserved EF and grade 1 diastolic dysfunction. 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 Home medications include Lasix, Coreg, Hydralazine, Clonidine patch, Imdur, Nicardipine. These were continued during her hospitalization.  Discharge Vitals:   BP (!) 186/109 (BP Location: Right Arm)   Pulse 77    Temp 98 F (36.7 C) (Oral)   Resp 18   Ht  (1.702 m)   Wt 227 lb (103 kg) Comment: scale a  SpO2 97%   BMI 35.55 kg/m   Pertinent Labs, Studies, and Procedures:  CBC Latest Ref Rng & Units 06/30/2017 04/14/2017 04/13/2017  WBC 4.0 - 10.5 K/uL 10.0 8.4 11.7(H)  Hemoglobin 12.0 - 15.0 g/dL 16.1 09.6 04.5  Hematocrit 36.0 - 46.0 % 36.7 37.6 42.6  Platelets 150 - 400 K/uL 252 149(L) 229   CMP Latest Ref Rng & Units 07/01/2017 06/30/2017 04/14/2017  Glucose 65 - 99 mg/dL 409(W) 77 88  BUN 6 - 20 mg/dL 11(B) 14(N) 82(N)  Creatinine 0.44 - 1.00 mg/dL 5.62(Z) 3.08(M) 5.78(I)  Sodium 135 - 145 mmol/L 139 139 133(L)  Potassium 3.5 - 5.1 mmol/L 3.4(L) 2.8(L) 4.1  Chloride 101 - 111 mmol/L 105 101 102  CO2 22 - 32 mmol/L 20(L) 25 18(L)  Calcium 8.9 - 10.3 mg/dL 9.0 9.3 9.1  Total Protein 6.5 - 8.1 g/dL - 7.0 -  Total Bilirubin 0.3 - 1.2 mg/dL - 0.5 -  Alkaline Phos 38 - 126 U/L - 78 -  AST 15 - 41 U/L - 30 -  ALT 14 - 54 U/L - 11(L) -   Trop 0.19 -> 0.12 Lipase 85 INR 0.90 Mg 1.7 Beta HCG negative  CXR 06/30/2017 Mild vascular congestion similar to prior. No pulmonary consolidation, effusion or pneumothorax.  Discharge Instructions: Discharge Instructions    Diet - low sodium heart healthy   Complete by:  As directed    Discharge instructions   Complete by:  As directed    Ms. Orcutt,  It was good to meet you. While you were here, we treated you for an exacerbation of your COPD. The treatment for this is antibiotics and steroids, which you did receive while you were in the hospital. - Please take azithromycin  once a day for 3 days, starting tomorrow (5/9-5/12) - Please take prednisone  once a day with breakfast for 3 days, starting tomorrow (5/9-5/12)  We also talked about getting a CPAP machine for you at home. Please talk to your primary doctor about getting this done. I think this will really help you feel better throughout the day.  Please make sure you follow  up with your primary doctor for continued management of your health conditions as an outpatient. Take lasix  twice a day until you follow up with your primary doctor.  Good luck in [place]!   Increase activity slowly   Complete by:  As directed      Signed: Scherrie Gerlach, MD 07/01/2017, 5:27 PM   Pager: Demetrius Charity [phone]

## 2017-07-01 NOTE — Progress Notes (Signed)
Internal Medicine Attending  Date: 07/01/2017  Patient name: Jacqueline Blackburn Medical record number: [id number] Date of birth: Aug 03, 1975 Age: 42 y.o. Gender: female  I saw and evaluated the patient. I reviewed the resident's note by Dr. Renaldo Reel and I agree with the resident's findings and plans as documented in her progress note.  Please see my H&P dated 07/01/2017 for the specifics of my evaluation, assessment, and plan from earlier in the day.

## 2017-07-01 NOTE — Progress Notes (Signed)
MD placed order for coreg  Pt discharged via wheelchair with volunteer services  Pt has all belongings

## 2017-07-01 NOTE — Care Management Note (Signed)
Case Management Note  Patient Details  Name: Jacqueline Blackburn MRN: [id number] Date of Birth: [birth year]  Subjective/Objective:   COPD, Asthma                Action/Plan: Patient lives at home, from [place] and is planning on moving back to [facility] next week; she is established with the medical team at [facility] for primary care; has private insurance with Medicare / Medicaid; pharmacy of choice is [facility]; patient stated that she plans to cut back on smoking but do not plan to stop using MJ because she states that it is the only thing that helps her nerves. CM will continue to follow for progression of care.  Expected Discharge Date:  07/02/17               Expected Discharge Plan:  Home/Self Care  Discharge planning Services  CM Consult  Status of Service:  In process, will continue to follow  Cherrie Distance, RN 07/01/2017, 12:13 PM

## 2017-07-01 NOTE — Progress Notes (Signed)
Paged MD regarding pt stating she does not have coreg at home, awaiting call back  Pt IV discontinued, catheter intact and telemetry removed by NT. Pt discharge education provided at bedside. Pt has all belongings. Awaiting pt transportation.

## 2017-07-01 NOTE — Progress Notes (Signed)
SATURATION QUALIFICATIONS: (This note is used to comply with regulatory documentation for home oxygen)  Patient Saturations on Room Air at Rest = 98%  Patient Saturations on Room Air while Ambulating = 98%  Patient Saturations on n/a Liters of oxygen while Ambulating = n/a %  Ambulated 242ft, no assistive devices, tolerated well

## 2017-07-01 NOTE — Progress Notes (Signed)
   Subjective:  Jacqueline Blackburn was sitting up in bed comfortably this morning. She reports her breathing is much better. She was able to walk around the room without issue. She was able to use the CPAP machine last night and states it was very helpful. She is in the process of getting a CPAP machine at home. She is about to move to [place] and does have an appointment with a primary doctor set up.  Objective:  Vital signs in last 24 hours: Vitals:   06/30/17 2003 07/01/17 0025 07/01/17 0212 07/01/17 0444  BP: (!) 182/107 (!) 164/89  (!) 170/110  Pulse: 82 (!) 59  78  Resp: 16   18  Temp: 98.4 F (36.9 C)   98.1 F (36.7 C)  TempSrc: Oral   Oral  SpO2: 96%   100%  Weight:   227 lb (103 kg)   Height:       GEN: Sitting in bed in NAD CV: NR & RR, no m/r/g PULM: No crackles or wheezing, no increased work of breathing ABD: Obese, Soft, NT EXT: 1+ LE edema bilaterally  Assessment/Plan:  Active Problems:   Dyspnea  COPD exacerbation Improved with steroids and breathing treatments. Satting well on RA. Able to walk around the room without issue. Currently on azithromycin and prednisone. Amenable to discharge today and has plans to follow up with a PCP on discharge. - Continue azithromycin  daily for 5 days total (stop date 5/12) - Continue prednisone  daily for 5 days total (stop date 5/12) - Advised to f/u with PCP - Discharge today  OSA Nonadherent with CPAP at home due to not having the machine. She used the CPAP yesterday and said she feels more well-rested this morning than before. Encouraged her to continue follow up with PCP to get an outpatient CPAP machine. - CPAP QHS  CKD Hypokalemia, improved Previously on dialysis (stopped in Feb 2019). Continues to make urine. Cr at baseline. K 3.4 this morning. Nephrology consulted - no need for dialysis. After recent hospitalization, she was discharged on lasix  BID, will continue this on discharge. - Continue lasix   BID  HFpEF (Grade 1 DD) CAD HTN Non-obstructive cath in April 2017. She reports hx of 2 prior MI without stents placed. Hypertensive to 189/110, however she is asymptomatic and is chronically elevated. Will restart home medications on discharge. - Continue home lasix  BID, carvedilol, hydralazine, nifedipine, clonidine patch, and imdur - Continue home ASA  Elevated troponin Demand ischemia in the setting of COPD exacerbation. Trop 0.19->0.12  Dispo: Anticipated discharge today   Scherrie Gerlach, MD 07/01/2017, 6:22 AM Pager: Demetrius Charity [phone]

## 2017-07-01 NOTE — H&P (Signed)
Internal Medicine Attending Admission Note Date: 07/01/2017  Patient name: Jacqueline Blackburn Medical record number: [id number] Date of birth: 09-23-1975 Age: 42 y.o. Gender: female  I saw and evaluated the patient. I reviewed the resident's note and I agree with the resident's findings and plan as documented in the resident's note.  Chief Complaint(s): Cough productive of a clear sputum and shortness of breath 3 days.  History - key components related to admission:  Jacqueline Blackburn is a 42 year old woman with a history of nonobstructive coronary artery disease, chronic diastolic heart failure, obstructive sleep apnea that is untreated as she was nonadherent with CPAP, chronic kidney disease previously on hemodialysis, chronic obstructive pulmonary disease, tobacco abuse, and hypertension who presents with a three-day history of cough productive of clear sputum and shortness of breath. She presented to the emergency department with these symptoms and was admitted to the internal medicine teaching service for further evaluation and care. The dyspnea has been progressive and she does report chills without fevers at home.  She also notes sick contacts at home.  When seen on rounds the morning following admission she felt markedly improved and back to her baseline. She felt that the CPAP was very helpful in allowing her to get an excellent night's sleep. She is now convinced that she needs to be compliant with her CPAP if she were to get another machine.  Physical Exam - key components related to admission:  Vitals:   07/01/17 0444 07/01/17 0805 07/01/17 1100 07/01/17 1126  BP: (!) 170/110  (!) 189/110 (!) 186/109  Pulse: 78   77  Resp: 18     Temp: 98.1 F (36.7 C)  98 F (36.7 C) 98 F (36.7 C)  TempSrc: Oral  Oral Oral  SpO2: 100% 98% 98% 97%  Weight:      Height:       Gen.: Well-developed, well-nourished, woman sitting comfortably in bed in no acute distress. Lungs: Clear to auscultation  bilaterally without wheezes, rhonchi, or rales. Extremities: Without edema.  Lab results:  Basic Metabolic Panel: Recent Labs    06/30/17 0239 06/30/17 0929 07/01/17 0517  NA 139  --  139  K 2.8*  --  3.4*  CL 101  --  105  CO2 25  --  20*  GLUCOSE 77  --  102*  BUN 52*  --  54*  CREATININE 4.35*  --  4.28*  CALCIUM 9.3  --  9.0  MG  --  1.7  --   PHOS  --   --  2.3*   Liver Function Tests: Recent Labs    06/30/17 0929 07/01/17 0517  AST 30  --   ALT 11*  --   ALKPHOS 78  --   BILITOT 0.5  --   PROT 7.0  --   ALBUMIN 3.3* 3.1*   Recent Labs    06/30/17 0929  LIPASE 85*   CBC: Recent Labs    06/30/17 0239  WBC 10.0  HGB 12.0  HCT 36.7  MCV 90.2  PLT 252   Coagulation: Recent Labs    06/30/17 0929  INR 0.90   Misc. Labs:  BNP 427.5 Blood hCG undetectable  Imaging results:   PA and lateral chest x-ray: Personally reviewed. No effusions, infiltrates, or masses. Unchanged from the previous chest x-ray on 04/12/2017.  Other results:  EKG: Personally reviewed. Normal sinus rhythm at 89 bpm, normal axis, prolonged QT, left atrial enlargement, no significant Q waves, LVH by voltage in aVL, delayed  R wave progression, no ST segment changes, lateral T wave inversions in a strain pattern. No significant change from the previous ECG on 04/12/2017.  Assessment & Plan by Problem:  Jacqueline Blackburn is a 42 year old woman with a history of nonobstructive coronary artery disease, chronic diastolic heart failure, obstructive sleep apnea that is untreated as she was nonadherent with CPAP, chronic kidney disease previously on hemodialysis, chronic obstructive pulmonary disease, tobacco abuse, and hypertension who presents with a three-day history of cough productive of clear sputum and shortness of breath. This was felt to be secondary to a mild COPD exacerbation likely from a viral infection she contracted from a sick contact at home. Symptomatically she responded  extremely well to bronchodilators, prednisone, and azithromycin. She felt back to baseline by the following morning  1) Chronic obstructive pulmonary disease exacerbation: Responding well to bronchodilators, prednisone, and azithromycin. She will be discharged home on a 5 day course of prednisone and azithromycin.  2) Obstructive sleep apnea: Symptomatically she felt well rested after using the CPAP overnight. She was encouraged to contact her primary care provider and consider undergoing another sleep study in giving nocturnal nasal CPAP another try given her excellent response.  3) Disposition: She will be discharged home today. She already has primary care follow-up arranged when she moves back to [place] in the very near future.

## 2017-07-01 NOTE — Progress Notes (Signed)
  [facility] Progress Note    Assessment/ Plan:   42 yo female with CKD, CAD, CHF, asthma, COPD, HTN, h/o stroke, OSA, current smoker who presented with shortness of breath from a COPD exacerbation. She was previously on dialysis but recovered renal function and has continued to make urine so has been off HD since February 2019.  1. COPD exacerbation on prednisone and azithromycin per primary 2. CKD IV. No indication for HD. Cr stable at baseline Cr ~4. 3. Hypokalemia, s/p repletion improved to K 3.4 today. 4. HTN, elevated. On lasix, coreg, hydralazine, clonidine, imdur, nicardipine per primary. 5. HFpEF 6. CAD on ASA 7. OSA Signing off as Cr stable and no indication for HD at this time.  Subjective:   States breathing is improved. No fever/chills. States has been urinating without issues.   Objective:   BP (!) 170/110 (BP Location: Right Arm)   Pulse 78   Temp 98.1 F (36.7 C) (Oral)   Resp 18   Ht  (1.702 m)   Wt 227 lb (103 kg) Comment: scale a  SpO2 100%   BMI 35.55 kg/m   Intake/Output Summary (Last 24 hours) at 07/01/2017 0741 Last data filed at 07/01/2017 0232 Gross per 24 hour  Intake 720 ml  Output 1000 ml  Net -280 ml   Weight change: -5 lb (-2.268 kg)  Physical Exam: Gen: sitting up in bed, in NAD CVS: regular rate and rhythm, normal s1 and s2, no murmurs Resp: expiratory wheezes without crackles, good air movement throughout. Normal effort on room air [id number], nontender, nondistended, + bowel sounds Ext: no LE edema  Imaging: Dg Chest 2 View  Result Date: 06/30/2017 CLINICAL DATA:  Chest discomfort, dyspnea and cough EXAM: CHEST - 2 VIEW COMPARISON:  04/12/2017 FINDINGS: The heart size and mediastinal contours are within normal limits. Mild pulmonary vascular congestion. Both lungs are clear. The visualized skeletal structures are unremarkable. IMPRESSION: Mild vascular congestion similar to prior. No pulmonary consolidation, effusion or  pneumothorax. Electronically Signed   By: Tollie Eth M.D.   On: 06/30/2017 03:55    Labs: BMET Recent Labs  Lab 06/30/17 0239  NA 139  K 2.8*  CL 101  CO2 25  GLUCOSE 77  BUN 52*  CREATININE 4.35*  CALCIUM 9.3   CBC Recent Labs  Lab 06/30/17 0239  WBC 10.0  HGB 12.0  HCT 36.7  MCV 90.2  PLT 252    Medications:    . aspirin EC  81 mg Oral Daily  . azithromycin  250 mg Oral Daily  . carvedilol  25 mg Oral BID WC  . [START ON 07/03/2017] cloNIDine  0.1 mg Transdermal Weekly  . fluticasone  1 spray Each Nare BID  . furosemide  80 mg Oral BID  . heparin  5,000 Units Subcutaneous Q8H  . hydrALAZINE  100 mg Oral TID  . ipratropium-albuterol  3 mL Nebulization TID  . isosorbide mononitrate  60 mg Oral Daily  . mometasone-formoterol  2 puff Inhalation BID  . nicotine  7 mg Transdermal Daily  . NIFEdipine  60 mg Oral BID  . predniSONE  40 mg Oral Q breakfast      Leland Her, DO PGY-2, [facility] 07/01/2017 7:41 AM

## 2017-07-01 NOTE — Progress Notes (Signed)
Paged MD regarding pt elevated blood pressure  Pt states that her blood pressure at home runs higher, systolic in 200s Pt non symptomatic  MD returned page, no new orders  Will continue to monitor

## 2017-07-03 ENCOUNTER — Observation Stay (HOSPITAL_COMMUNITY)
Admission: EM | Admit: 2017-07-03 | Discharge: 2017-07-04 | Disposition: A | Discharge status: Home / Self Care | Payer: Medicare Other | Attending: Internal Medicine | Admitting: Internal Medicine

## 2017-07-03 ENCOUNTER — Emergency Department (HOSPITAL_COMMUNITY): Payer: Medicare Other

## 2017-07-03 ENCOUNTER — Encounter (HOSPITAL_COMMUNITY): Payer: Self-pay | Admitting: Emergency Medicine

## 2017-07-03 ENCOUNTER — Other Ambulatory Visit: Payer: Self-pay

## 2017-07-03 DIAGNOSIS — Z888 Allergy status to other drugs, medicaments and biological substances status: Secondary | ICD-10-CM | POA: Insufficient documentation

## 2017-07-03 DIAGNOSIS — N186 End stage renal disease: Secondary | ICD-10-CM | POA: Insufficient documentation

## 2017-07-03 DIAGNOSIS — J441 Chronic obstructive pulmonary disease with (acute) exacerbation: Secondary | ICD-10-CM | POA: Diagnosis not present

## 2017-07-03 DIAGNOSIS — I2510 Atherosclerotic heart disease of native coronary artery without angina pectoris: Secondary | ICD-10-CM | POA: Insufficient documentation

## 2017-07-03 DIAGNOSIS — Z992 Dependence on renal dialysis: Secondary | ICD-10-CM | POA: Diagnosis not present

## 2017-07-03 DIAGNOSIS — I5032 Chronic diastolic (congestive) heart failure: Secondary | ICD-10-CM | POA: Insufficient documentation

## 2017-07-03 DIAGNOSIS — I132 Hypertensive heart and chronic kidney disease with heart failure and with stage 5 chronic kidney disease, or end stage renal disease: Secondary | ICD-10-CM | POA: Diagnosis not present

## 2017-07-03 DIAGNOSIS — G4733 Obstructive sleep apnea (adult) (pediatric): Secondary | ICD-10-CM | POA: Diagnosis not present

## 2017-07-03 DIAGNOSIS — R079 Chest pain, unspecified: Secondary | ICD-10-CM | POA: Diagnosis present

## 2017-07-03 DIAGNOSIS — I10 Essential (primary) hypertension: Secondary | ICD-10-CM

## 2017-07-03 DIAGNOSIS — N185 Chronic kidney disease, stage 5: Secondary | ICD-10-CM

## 2017-07-03 DIAGNOSIS — E876 Hypokalemia: Secondary | ICD-10-CM | POA: Insufficient documentation

## 2017-07-03 DIAGNOSIS — K219 Gastro-esophageal reflux disease without esophagitis: Secondary | ICD-10-CM | POA: Diagnosis not present

## 2017-07-03 DIAGNOSIS — Z79899 Other long term (current) drug therapy: Secondary | ICD-10-CM | POA: Insufficient documentation

## 2017-07-03 DIAGNOSIS — Z8673 Personal history of transient ischemic attack (TIA), and cerebral infarction without residual deficits: Secondary | ICD-10-CM | POA: Insufficient documentation

## 2017-07-03 DIAGNOSIS — Z7982 Long term (current) use of aspirin: Secondary | ICD-10-CM | POA: Insufficient documentation

## 2017-07-03 DIAGNOSIS — F17210 Nicotine dependence, cigarettes, uncomplicated: Secondary | ICD-10-CM | POA: Insufficient documentation

## 2017-07-03 DIAGNOSIS — R0600 Dyspnea, unspecified: Secondary | ICD-10-CM | POA: Diagnosis present

## 2017-07-03 DIAGNOSIS — I252 Old myocardial infarction: Secondary | ICD-10-CM | POA: Insufficient documentation

## 2017-07-03 LAB — BASIC METABOLIC PANEL
Anion gap: 14 (ref 5–15)
BUN: 60 mg/dL — AB (ref 6–20)
CALCIUM: 8.6 mg/dL — AB (ref 8.9–10.3)
CO2: 23 mmol/L (ref 22–32)
Chloride: 100 mmol/L — ABNORMAL LOW (ref 101–111)
Creatinine, Ser: 4.44 mg/dL — ABNORMAL HIGH (ref 0.44–1.00)
GFR calc Af Amer: 13 mL/min — ABNORMAL LOW (ref >60)
GFR, EST NON AFRICAN AMERICAN: 11 mL/min — AB (ref >60)
GLUCOSE: 96 mg/dL (ref 65–99)
Potassium: 3.1 mmol/L — ABNORMAL LOW (ref 3.5–5.1)
Sodium: 137 mmol/L (ref 135–145)

## 2017-07-03 LAB — I-STAT TROPONIN, ED
TROPONIN I, POC: 0.14 ng/mL — AB (ref 0.00–0.08)
Troponin i, poc: 0.12 ng/mL (ref 0.00–0.08)

## 2017-07-03 LAB — CBC
HCT: 40.2 % (ref 36.0–46.0)
HEMOGLOBIN: 13.2 g/dL (ref 12.0–15.0)
MCH: 29.6 pg (ref 26.0–34.0)
MCHC: 32.8 g/dL (ref 30.0–36.0)
MCV: 90.1 fL (ref 78.0–100.0)
Platelets: 308 10*3/uL (ref 150–400)
RBC: 4.46 MIL/uL (ref 3.87–5.11)
RDW: 16.2 % — ABNORMAL HIGH (ref 11.5–15.5)
WBC: 16.2 10*3/uL — ABNORMAL HIGH (ref 4.0–10.5)

## 2017-07-03 LAB — I-STAT BETA HCG BLOOD, ED (MC, WL, AP ONLY): I-stat hCG, quantitative: <5 m[IU]/mL (ref <5)

## 2017-07-03 MED ORDER — MOMETASONE FURO-FORMOTEROL FUM 200-5 MCG/ACT IN AERO
2.0000 | INHALATION_SPRAY | Freq: Two times a day (BID) | RESPIRATORY_TRACT | Status: DC
  Administered 2017-07-03 – 2017-07-04 (×2): 2 via RESPIRATORY_TRACT
  Filled 2017-07-03: qty 8.8

## 2017-07-03 MED ORDER — HYDRALAZINE HCL 50 MG PO TABS
100.0000 mg | ORAL_TABLET | Freq: Three times a day (TID) | ORAL | Status: DC
  Administered 2017-07-03 – 2017-07-04 (×3): 100 mg via ORAL
  Filled 2017-07-03 (×4): qty 2

## 2017-07-03 MED ORDER — ASPIRIN EC 81 MG PO TBEC
81.0000 mg | DELAYED_RELEASE_TABLET | Freq: Every day | ORAL | Status: DC
  Administered 2017-07-03 – 2017-07-04 (×2): 81 mg via ORAL
  Filled 2017-07-03 (×2): qty 1

## 2017-07-03 MED ORDER — CLONIDINE HCL 0.1 MG/24HR TD PTWK
0.1000 mg | MEDICATED_PATCH | TRANSDERMAL | Status: DC
  Administered 2017-07-03: 0.1 mg via TRANSDERMAL
  Filled 2017-07-03: qty 1

## 2017-07-03 MED ORDER — IPRATROPIUM BROMIDE 0.02 % IN SOLN
0.5000 mg | Freq: Once | RESPIRATORY_TRACT | Status: AC
  Administered 2017-07-03: 0.5 mg via RESPIRATORY_TRACT
  Filled 2017-07-03: qty 2.5

## 2017-07-03 MED ORDER — AZITHROMYCIN 500 MG PO TABS
250.0000 mg | ORAL_TABLET | Freq: Every day | ORAL | Status: DC
  Administered 2017-07-03 – 2017-07-04 (×2): 250 mg via ORAL
  Filled 2017-07-03 (×2): qty 1

## 2017-07-03 MED ORDER — CLONIDINE HCL 0.3 MG/24HR TD PTWK
0.3000 mg | MEDICATED_PATCH | TRANSDERMAL | Status: DC
  Administered 2017-07-04: 0.3 mg via TRANSDERMAL
  Filled 2017-07-03 (×2): qty 1

## 2017-07-03 MED ORDER — RAMELTEON 8 MG PO TABS
8.0000 mg | ORAL_TABLET | Freq: Every day | ORAL | Status: AC
  Administered 2017-07-03: 8 mg via ORAL
  Filled 2017-07-03: qty 1

## 2017-07-03 MED ORDER — DICLOFENAC SODIUM 1 % TD GEL
2.0000 g | Freq: Four times a day (QID) | TRANSDERMAL | Status: DC | PRN
  Filled 2017-07-03: qty 100

## 2017-07-03 MED ORDER — CARVEDILOL 25 MG PO TABS
25.0000 mg | ORAL_TABLET | Freq: Two times a day (BID) | ORAL | Status: DC
  Administered 2017-07-03 – 2017-07-04 (×2): 25 mg via ORAL
  Filled 2017-07-03 (×2): qty 1

## 2017-07-03 MED ORDER — ALBUTEROL SULFATE (2.5 MG/3ML) 0.083% IN NEBU
5.0000 mg | INHALATION_SOLUTION | Freq: Once | RESPIRATORY_TRACT | Status: AC
  Administered 2017-07-03: 5 mg via RESPIRATORY_TRACT
  Filled 2017-07-03: qty 6

## 2017-07-03 MED ORDER — POTASSIUM CHLORIDE CRYS ER 20 MEQ PO TBCR
40.0000 meq | EXTENDED_RELEASE_TABLET | Freq: Once | ORAL | Status: AC
  Administered 2017-07-03: 40 meq via ORAL
  Filled 2017-07-03: qty 2

## 2017-07-03 MED ORDER — ACETAMINOPHEN 650 MG RE SUPP: 650.0000 mg | Freq: Four times a day (QID) | RECTAL | Status: DC | PRN

## 2017-07-03 MED ORDER — GUAIFENESIN-DM 100-10 MG/5ML PO SYRP
5.0000 mL | ORAL_SOLUTION | ORAL | Status: DC | PRN
Start: 2017-07-03 — End: 2017-07-04
  Administered 2017-07-03: 5 mL via ORAL
  Filled 2017-07-03: qty 5

## 2017-07-03 MED ORDER — NIFEDIPINE ER OSMOTIC RELEASE 60 MG PO TB24
60.0000 mg | ORAL_TABLET | Freq: Two times a day (BID) | ORAL | Status: DC
  Administered 2017-07-03 – 2017-07-04 (×3): 60 mg via ORAL
  Filled 2017-07-03 (×5): qty 1

## 2017-07-03 MED ORDER — FLUTICASONE PROPIONATE 50 MCG/ACT NA SUSP
1.0000 | Freq: Two times a day (BID) | NASAL | Status: DC
  Administered 2017-07-03 – 2017-07-04 (×2): 1 via NASAL
  Filled 2017-07-03: qty 16

## 2017-07-03 MED ORDER — FUROSEMIDE 80 MG PO TABS
80.0000 mg | ORAL_TABLET | Freq: Two times a day (BID) | ORAL | Status: DC
  Administered 2017-07-03 – 2017-07-04 (×2): 80 mg via ORAL
  Filled 2017-07-03 (×2): qty 1

## 2017-07-03 MED ORDER — HEPARIN SODIUM (PORCINE) 5000 UNIT/ML IJ SOLN
5000.0000 [IU] | Freq: Three times a day (TID) | INTRAMUSCULAR | Status: DC
  Administered 2017-07-03: 5000 [IU] via SUBCUTANEOUS
  Filled 2017-07-03 (×2): qty 1

## 2017-07-03 MED ORDER — IPRATROPIUM-ALBUTEROL 0.5-2.5 (3) MG/3ML IN SOLN
3.0000 mL | Freq: Four times a day (QID) | RESPIRATORY_TRACT | Status: DC
  Administered 2017-07-03 (×2): 3 mL via RESPIRATORY_TRACT
  Filled 2017-07-03 (×2): qty 3

## 2017-07-03 MED ORDER — ALBUTEROL SULFATE (2.5 MG/3ML) 0.083% IN NEBU: 5.0000 mg | INHALATION_SOLUTION | RESPIRATORY_TRACT | Status: DC | PRN

## 2017-07-03 MED ORDER — ISOSORBIDE MONONITRATE ER 60 MG PO TB24
60.0000 mg | ORAL_TABLET | Freq: Every day | ORAL | Status: DC
  Administered 2017-07-03 – 2017-07-04 (×2): 60 mg via ORAL
  Filled 2017-07-03 (×2): qty 1

## 2017-07-03 MED ORDER — SODIUM BICARBONATE 650 MG PO TABS
650.0000 mg | ORAL_TABLET | Freq: Three times a day (TID) | ORAL | Status: DC
  Administered 2017-07-03 – 2017-07-04 (×2): 650 mg via ORAL
  Filled 2017-07-03 (×2): qty 1

## 2017-07-03 MED ORDER — ACETAMINOPHEN 325 MG PO TABS: 650.0000 mg | ORAL_TABLET | Freq: Four times a day (QID) | ORAL | Status: DC | PRN

## 2017-07-03 MED ORDER — ALBUTEROL (5 MG/ML) CONTINUOUS INHALATION SOLN
10.0000 mg/h | INHALATION_SOLUTION | Freq: Once | RESPIRATORY_TRACT | Status: AC
  Administered 2017-07-03: 10 mg/h via RESPIRATORY_TRACT
  Filled 2017-07-03: qty 20

## 2017-07-03 MED ORDER — IPRATROPIUM-ALBUTEROL 0.5-2.5 (3) MG/3ML IN SOLN: 3.0000 mL | Freq: Once | RESPIRATORY_TRACT | Status: DC

## 2017-07-03 MED ORDER — ONDANSETRON HCL 4 MG/2ML IJ SOLN
4.0000 mg | Freq: Once | INTRAMUSCULAR | Status: AC
  Administered 2017-07-03: 4 mg via INTRAVENOUS
  Filled 2017-07-03: qty 2

## 2017-07-03 MED ORDER — METHYLPREDNISOLONE SODIUM SUCC 125 MG IJ SOLR
60.0000 mg | Freq: Every day | INTRAMUSCULAR | Status: DC
  Administered 2017-07-03 – 2017-07-04 (×2): 60 mg via INTRAVENOUS
  Filled 2017-07-03 (×2): qty 2

## 2017-07-03 NOTE — ED Notes (Signed)
Patient transported to X-ray 

## 2017-07-03 NOTE — H&P (Signed)
Date: 07/03/2017               Patient Name:  Jacqueline Blackburn MRN: [id number]  DOB: May 07, 1975 Age / Sex: 41 y.o., female   PCP: Patient, No Pcp Per         Medical Service: Internal Medicine Teaching Service         Attending Physician: Dr. Azalia Bilis, MD    First Contact: Dr. Renaldo Reel Pager: [phone]  Second Contact: Dr. Antony Contras Pager: [phone]       After Hours (After 5p/  First Contact Pager: [phone]  weekends / holidays): Second Contact Pager: [phone]   Chief Complaint: Chest pain and cough  History of Present Illness: This is a 42 y.o. woman with PMHx of non-obstructive CAD, HFpEF, ESRD previously on HD, OSA non-adherent to therapy, asthma, COPD, and HTN who presents to the ED with chest pain and coughing.  She also reports increased bowel movements.  She was recently admitted to the hospital for presumed COPD exacerbation from 5/7 - 5/8 and discharged on a 5 day course of azithromycin and prednisone.  She reports that last evening while at rest she began having chest tightness and pressure.  She took nitroglycerin and ASA without relief.  Shortly after she began coughing as well.  A second nitroglycerin helped to relieve her chest pain symptoms and she has been without pain since 4 am except for when she is coughing.   She describes her cough as dry with the occasional production of clear sputum.  She reports that yesterday afternoon she had a coughing spell while at the store and thought she may have just overdone it as she improves from her illness.  She has been adherent to her discharge medications.  She has been living in an environment where she continues to be exposed to people smoking.  Otherwise there have not been other environmental exposures that she is aware of.  She is personally more cognizant of her past asthma history, stating it has been several years since her last exacerbation and that she used to be on Singulair.  Her biggest complaint this morning at present is  her cough and feeling of dyspnea.    In the ED, she was given breathing treatments and labs were checked.  Troponin was mildly elevated as it was on prior admission with a delta troponin having downtrended.  CBC was notable for leukocytosis in the setting of prednisone use.  BMET showed evidence of her stage 5 CKD with her creatinine relatively unchanged.  She was ambulated on room air with an oxygen saturation greater than 97%.  Meds:  Current Meds  Medication Sig  . albuterol (PROVENTIL HFA;VENTOLIN HFA) 108 (90 Base) MCG/ACT inhaler Inhale 2 puffs into the lungs every 4 (four) hours as needed for wheezing or shortness of breath.  Marland Kitchen albuterol (PROVENTIL) (2.5 MG/3ML) 0.083% nebulizer solution Take 2.5 mg by nebulization every 6 (six) hours as needed for wheezing or shortness of breath.  Marland Kitchen aspirin EC 81 MG tablet Take 81 mg by mouth daily.  Marland Kitchen aspirin-acetaminophen-caffeine (EXCEDRIN MIGRAINE) 250-250-65 MG tablet Take 2 tablets by mouth every 6 (six) hours as needed for headache.  Marland Kitchen azithromycin (ZITHROMAX) 250 MG tablet Take 1 tablet (250 mg total) by mouth daily for 3 days.  . carvedilol (COREG) 25 MG tablet Take 1 tablet (25 mg total) by mouth 2 (two) times daily with a meal.  . cloNIDine (CATAPRES - DOSED IN MG/24 HR) 0.1 mg/24hr patch Place  0.1 mg onto the skin once a week.  . cloNIDine (CATAPRES - DOSED IN MG/24 HR) 0.3 mg/24hr patch Place 0.3 mg onto the skin once a week.  . fluticasone (FLONASE) 50 MCG/ACT nasal spray Place 1 spray into both nostrils 2 (two) times daily.  . furosemide (LASIX) 80 MG tablet Take 1 tablet (80 mg total) by mouth 2 (two) times daily.  . hydrALAZINE (APRESOLINE) 100 MG tablet Take 100 mg by mouth 3 (three) times daily.  . isosorbide mononitrate (IMDUR) 60 MG 24 hr tablet Take 60 mg by mouth daily.  . meclizine (ANTIVERT) 25 MG tablet Take 25 mg by mouth every 8 (eight) hours as needed for dizziness.  . mometasone-formoterol (DULERA) 200-5 MCG/ACT AERO Inhale 2  puffs into the lungs 2 (two) times daily.  Marland Kitchen NIFEdipine (PROCARDIA-XL/ADALAT CC) 60 MG 24 hr tablet Take 60 mg by mouth 2 (two) times daily.  . nitroGLYCERIN (NITROSTAT) 0.4 MG SL tablet Place 0.4 mg under the tongue every 5 (five) minutes as needed for chest pain.  Marland Kitchen ondansetron (ZOFRAN) 4 MG tablet Take 4 mg by mouth every 8 (eight) hours as needed for nausea or vomiting.  . predniSONE (DELTASONE) 20 MG tablet Take 2 tablets (40 mg total) by mouth daily with breakfast for 3 days.  . sodium bicarbonate 650 MG tablet Take 1 tablet (650 mg total) by mouth 3 (three) times daily.     Allergies: Allergies as of 07/03/2017 - Review Complete 07/03/2017  Allergen Reaction Noted  . Lisinopril Other (See Comments) 04/12/2017   Past Medical History:  Diagnosis Date  . Asthma   . CHF (congestive heart failure) (HCC)   . COPD (chronic obstructive pulmonary disease) (HCC)   . Coronary artery disease   . GERD (gastroesophageal reflux disease)   . Hemodialysis patient (HCC)   . History of degenerative disc disease   . Hypertension   . MI (myocardial infarction) (HCC) 2001   Patient reports 16 MIs  . Renal disorder    ESRD  . Sleep apnea   . Stroke [facility]) 2017   "mild stroke/TIA"    Family History: Maternal grandmother with HTN, DM, MI, cancer.  Maternal grandfather with HTN, COPD, CVA, and Cancer.  Paternal grandmother with HTN, DM, cancer.  Her mother's passed away at 13 and she does not know her father's PMHx.  Social History: Living currently in [place] with plans to relocate to [place], [place] soon.  She is disabled from her past medical problems.  She is divorced with no children.  She continues to smoke about 1/4 PPD x 30 years.  Denies EtOH.  Endorses marijuana use.  Review of Systems: A complete ROS was negative except as per HPI.   Physical Exam: Blood pressure (!) 165/125, pulse 77, temperature 98 F (36.7 C), temperature source Oral, resp. rate (!) 27, height  (1.702 m), weight  227 lb (103 kg), SpO2 98 %. Physical Exam  Constitutional: She is oriented to person, place, and time.  NAD, pleasant, cooperative with exam.   HENT:  Head: Normocephalic and atraumatic.  Mucus membranes are moist with mild erythema of her oropharynx.   Eyes: Conjunctivae and EOM are normal.  Neck: Normal range of motion.  Cardiovascular: Regular rhythm and normal heart sounds.  Mildly tachycardic rate.   Pulmonary/Chest: Effort normal.  Effort appears normal without distress.   She has diffuse expiratory wheezes bilaterally.   Abdominal: Soft. She exhibits no distension. There is no tenderness.  Musculoskeletal: She exhibits no edema.  Left upper extremity fistula with thrill   Neurological: She is alert and oriented to person, place, and time.  Skin: Skin is warm and dry.  Psychiatric: Mood and affect normal.     EKG: personally reviewed my interpretation sinus rhythm.  Prolonged PR.  Left atrial enlargement with LVH  CXR: personally reviewed my interpretation correlates with official read.  Slight pulmonary vascular congestion without acute pulmonary consolidation  Assessment & Plan by Problem: Principal Problem:   Dyspnea Active Problems:   Chest pain   COPD exacerbation (HCC)   Hypertension   Chronic kidney disease (CKD), stage V (HCC)  42 yo woman with PMHx COPD, asthma, CKD V, non-obstructive CAD, HTN, GERD presenting with chest pain, continued coughing and wheezing since hospital DC.  # Exacerbation of obstructive pulmonary disease: She is presenting with continued symptoms of asthma vs COPD exacerbation in the setting of continued environmental exposures in the home from cigarette smoke that are likely preventing her from getting well.  In the ED, her symptoms persisted despite breathing treatments and IMTS was asked to admit her to the hospital for her symptoms.  Her oxygen saturations have remained above 95% on ambient air.  - Continue azithromycin with stop date  5/12 - Solu-Medrol  daily - Scheduled Duonebs and Albuterol PRN - Continue Dulera - Continue Flonase - Counsel on smoking cessation and avoidance of triggers as best as possible. - Robitussin-DM for cough - Voltaren gel for likely MSK pain related to coughing  # CKD # Hypokalemia She was previously on HD for 8 years due to HTN.  Her renal function is at baseline from discharge 2 days ago.  She reports taking her Lasix as prescribed and making good urine output.  - Follow renal function - Supplement K - Continue Lasix  # HFpEF ## CAD ### HTN Her most recent Echo was in Feb 2019 with preserved EF and grade 1 diastolic dysfunction.  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  Her chest pain today is atypical with reassuring EKG and delta troponin trending down.  Home medications include Lasix, Coreg, Hydralazine, Clonidine patch, Imdur, Nicardipine.  These were given last hospitalization as well.  - Lasix  BID - ASA  daily - Coreg  BID - Hydralazine  TID - Nifedipine  BID - Clonidine patch replaced every Friday - Imdur  daily  # OSA Non adherent with CPAP at home. - CPAP QHS  # FEN Fluids: None Electrolytes: Replacing potassium.  Continue to monitor Nutrition: Renal diet  # DVT PPx: Heparin SQ  # CODE: FULL   Dispo: Admit patient to Observation with expected length of stay less than 2 midnights.  SignedGwynn Burly, DO 07/03/2017, 12:07 PM  Pager: [phone]

## 2017-07-03 NOTE — ED Notes (Signed)
Patient stating she is feeling nauseated, new orders per Dr Patria Mane.

## 2017-07-03 NOTE — ED Notes (Signed)
Pt. In room, vital signs documented and RN aware. Currently no family @ bedside

## 2017-07-03 NOTE — ED Provider Notes (Signed)
[facility] EMERGENCY DEPARTMENT Provider Note   CSN: [id number] Arrival date & time: 07/03/17  0245     History   Chief Complaint Chief Complaint  Patient presents with  . Chest Pain    HPI Shatora Littleton is a 42 y.o. female.  Patient with a complicated medical history including COPD, CHF, HTN, CAD (last cath 2018 "mild blockages" no stents), ESRD (last dialysis 03/2017). She arrives via EMS with complaint of chest pain that started tonight that was preceded by nausea, vomiting and multiple bowel movements. She had mild SoB. She reports this is different symptoms than with previous MI. She did take NTG x 2 with some relief. She was recently admitted for COPD exacerbation, diuresed during that stay, discharged home on 07/01/17  The history is provided by the patient. No language interpreter was used.    Past Medical History:  Diagnosis Date  . Asthma   . CHF (congestive heart failure) (HCC)   . COPD (chronic obstructive pulmonary disease) (HCC)   . Coronary artery disease   . GERD (gastroesophageal reflux disease)   . Hemodialysis patient (HCC)   . History of degenerative disc disease   . Hypertension   . MI (myocardial infarction) (HCC) 2001   Patient reports 16 MIs  . Renal disorder    ESRD  . Sleep apnea   . Stroke [facility]) 2017   "mild stroke/TIA"    Patient Active Problem List   Diagnosis Date Noted  . COPD exacerbation (HCC)   . Obstructive sleep apnea   . Dyspnea 06/30/2017  . Chest pain 04/15/2017  . Leg pain, left 04/15/2017  . ESRD (end stage renal disease) on dialysis (HCC) 04/12/2017    Past Surgical History:  Procedure Laterality Date  . ABDOMINAL HYSTERECTOMY    . BACK SURGERY    . CHOLECYSTECTOMY    . FOOT SURGERY Right   . TONSILLECTOMY       OB History   None      Home Medications    Prior to Admission medications   Medication Sig Start Date End Date Taking? Authorizing Provider  albuterol (PROVENTIL HFA;VENTOLIN  HFA) 108 (90 Base) MCG/ACT inhaler Inhale 2 puffs into the lungs every 4 (four) hours as needed for wheezing or shortness of breath.    [provider]  albuterol (PROVENTIL) (2.5 MG/3ML) 0.083% nebulizer solution Take 2.5 mg by nebulization every 6 (six) hours as needed for wheezing or shortness of breath.    [provider]  aspirin EC 81 MG tablet Take 81 mg by mouth daily.    [provider]  aspirin-acetaminophen-caffeine (EXCEDRIN MIGRAINE) [phone] MG tablet Take 2 tablets by mouth every 6 (six) hours as needed for headache.    [provider]  azithromycin (ZITHROMAX) 250 MG tablet Take 1 tablet (250 mg total) by mouth daily for 3 days. 07/02/17 07/05/17  Scherrie Gerlach, MD  carvedilol (COREG) 25 MG tablet Take 1 tablet (25 mg total) by mouth 2 (two) times daily with a meal. 07/01/17   Scherrie Gerlach, MD  cloNIDine (CATAPRES - DOSED IN MG/24 HR) 0.1 mg/24hr patch Place 0.1 mg onto the skin once a week.    [provider]  cloNIDine (CATAPRES - DOSED IN MG/24 HR) 0.3 mg/24hr patch Place 0.3 mg onto the skin once a week.    [provider]  fluticasone (FLONASE) 50 MCG/ACT nasal spray Place 1 spray into both nostrils 2 (two) times daily.    [provider]  furosemide (LASIX) 80 MG tablet Take 1 tablet (80 mg total) by mouth 2 (two) times daily. 07/01/17   Scherrie Gerlach, MD  hydrALAZINE (APRESOLINE) 100 MG tablet Take 100 mg by mouth 3 (three) times daily.    [provider]  isosorbide mononitrate (IMDUR) 60 MG 24 hr tablet Take 60 mg by mouth daily.    [provider]  meclizine (ANTIVERT) 25 MG tablet Take 25 mg by mouth every 8 (eight) hours as needed for dizziness.    [provider]  mometasone-formoterol (DULERA) 200-5 MCG/ACT AERO Inhale 2 puffs into the lungs 2 (two) times daily. 04/14/17   Reymundo Poll, MD  NIFEdipine (PROCARDIA-XL/ADALAT CC) 60 MG 24 hr tablet Take 60 mg by mouth 2 (two) times  daily.    [provider]  nitroGLYCERIN (NITROSTAT) 0.4 MG SL tablet Place 0.4 mg under the tongue every 5 (five) minutes as needed for chest pain.    [provider]  ondansetron (ZOFRAN) 4 MG tablet Take 4 mg by mouth every 8 (eight) hours as needed for nausea or vomiting.    [provider]  predniSONE (DELTASONE) 20 MG tablet Take 2 tablets (40 mg total) by mouth daily with breakfast for 3 days. 07/02/17 07/05/17  Scherrie Gerlach, MD  sodium bicarbonate 650 MG tablet Take 1 tablet (650 mg total) by mouth 3 (three) times daily. 04/14/17   Reymundo Poll, MD    Family History History reviewed. No pertinent family history.  Social History Social History   Tobacco Use  . Smoking status: Current Every Day Smoker    Packs/day: 0.25    Types: Cigarettes  . Smokeless tobacco: Never Used  Substance Use Topics  . Alcohol use: Not Currently    Frequency: Never  . Drug use: Yes    Types: Marijuana     Allergies   Lisinopril   Review of Systems Review of Systems  Constitutional: Negative for chills and fever.  HENT: Positive for congestion.   Respiratory: Positive for cough and shortness of breath.        Respiratory symptoms ongoing since recent hospitalization for COPD without change.   Cardiovascular: Positive for chest pain. Negative for leg swelling.  Gastrointestinal: Positive for diarrhea, nausea and vomiting.  Musculoskeletal: Negative.   Skin: Negative.   Neurological: Negative.      Physical Exam Updated Vital Signs BP (!) 152/111   Pulse 81   Temp 98 F (36.7 C) (Oral)   Resp 16   Ht  (1.702 m)   Wt 103 kg (227 lb)   SpO2 99%   BMI 35.55 kg/m   Physical Exam  Constitutional: She appears well-developed and well-nourished.  HENT:  Head: Normocephalic.  Neck: Normal range of motion. Neck supple.  Cardiovascular: Normal rate and regular rhythm.  Pulmonary/Chest: Effort normal. She has wheezes. She has rhonchi.  Abdominal:  Soft. Bowel sounds are normal. There is no tenderness. There is no rebound and no guarding.  Musculoskeletal: Normal range of motion.  Neurological: She is alert. No cranial nerve deficit.  Skin: Skin is warm and dry. No rash noted.  Psychiatric: She has a normal mood and affect.     ED Treatments / Results  Labs (all labs ordered are listed, but only abnormal results are displayed) Labs Reviewed  BASIC METABOLIC PANEL - Abnormal; Notable for the following components:      Result Value   Potassium 3.1 (*)    Chloride 100 (*)    BUN 60 (*)  Creatinine, Ser 4.44 (*)    Calcium 8.6 (*)    GFR calc non Af Amer 11 (*)    GFR calc Af Amer 13 (*)    All other components within normal limits  CBC - Abnormal; Notable for the following components:   WBC 16.2 (*)    RDW 16.2 (*)    All other components within normal limits  I-STAT TROPONIN, ED - Abnormal; Notable for the following components:   Troponin i, poc 0.14 (*)    All other components within normal limits  I-STAT BETA HCG BLOOD, ED ([facility], [facility], [facility] ONLY)    EKG None  Radiology Dg Chest 2 View  Result Date: 07/03/2017 CLINICAL DATA:  Chest pain, dyspnea, left arm and neck pain with nausea for several hours. EXAM: CHEST - 2 VIEW COMPARISON:  06/30/2017 FINDINGS: Stable borderline cardiomegaly. No aortic aneurysm. Minimal aortic atherosclerosis is identified. Mild vascular congestion without acute pulmonary consolidation, effusion or pneumothorax. No acute osseous abnormality. IMPRESSION: Stable borderline cardiomegaly with aortic atherosclerosis. Slight pulmonary vascular congestion without acute pulmonary consolidation or pneumothorax. Electronically Signed   By: Tollie Eth M.D.   On: 07/03/2017 03:27    Procedures Procedures (including critical care time)  Medications Ordered in ED Medications - No data to display   Initial Impression / Assessment and Plan / ED Course  I have reviewed the triage vital signs and the nursing  notes.  Pertinent labs & imaging results that were available during my care of the patient were reviewed by me and considered in my medical decision making (see chart for details).     Patient presents with CP, different from MI pain, starting tonight after onset of vomiting and multiple stools. No change in breathing/SoB with recent COPD exacerbation.   EKG unchanged. Troponin found to be elevated to 0.14. On review, it was 0.12 on discharge from the hospital 2 days ago.   Discussed with dr. Patria Mane. Troponin is not felt to indicate acute ischemia given her unchanged EKG, ESRD, h/o CHF.   Nebulizer ordered for treatment of wheezing. Will reassess.   Recheck of the patient after initial neb tx: she is still audibly wheezing. No desaturation, tachycardia or tachypnea. Continuous neb ordered. Patient care signed out to Glenford Bayley, PA-C.  Final Clinical Impressions(s) / ED Diagnoses   Final diagnoses:  None   1. Chest pain 2. Elevated troponin 3. H/o renal failure 4. COPD, current exacerbation  ED Discharge Orders    None       Danne Harbor 07/03/17 1610    Azalia Bilis, MD 07/03/17 [phone]

## 2017-07-03 NOTE — ED Notes (Signed)
Pt ambulatory to restroom without assistance. Pt returned to bed and is resting at this time.

## 2017-07-03 NOTE — ED Triage Notes (Signed)
Pt BIB EMS from home, c/o Lsided CP beginning about 1hr ago. Radiates to L arm, L neck. Also reports mild SOB, N/V with single episode of emesis. Hx of MIs, last one in 2018. Initially hypertensive for EMS at 206/120. Pt took  ASA, single nitro at home. Given  zofran and 1 nitro by EMS en route. VSS.

## 2017-07-03 NOTE — ED Notes (Signed)
Pt given water per Morrie Sheldon (RN)

## 2017-07-03 NOTE — Progress Notes (Signed)
Patient c/o diarrhea and abdominal pain, MD paged, no new orders at this time

## 2017-07-03 NOTE — ED Provider Notes (Signed)
Signout from [employer], PA-C at shift change Please see previous provider's note for full H&P  Patient with history of COPD, ESRD, CHF, hypertension who presents with chest pain, shortness of breath, wheezing.  Patient reports her chest pain feels different than her past MI chest pain.  It was mildly improved with nitroglycerin, but still present.  Patient's has expiratory wheezing throughout and is currently on continuous albuterol nebulizer.   Plan if patient's wheezing improves: If symptoms are improved, patient can be discharged home.  She is currently on prednisone and has a few days left.  Delta troponin is also pending, however elevation persistent since discharge from the hospital 3 days ago.  Unless significantly changed, plan for discharge.  On reassessment after continuous albuterol nebulizer, patient is still wheezing.  She states she feels a little better, however not near her baseline.  She was ambulated with pulse ox without significant changes, however patient states she does not usually have problems with ambulation.  Discussed patient case with the internal medicine teaching service who accepts readmission onto their service for further evaluation and treatment.   Emi Holes, PA-C 07/03/17 1435    Azalia Bilis, MD 07/03/17 [phone]

## 2017-07-03 NOTE — ED Notes (Signed)
Pt tolerated ambulating well.  O2 saturations stayed between 97-100% on RA.  Pt denied any dizziness, only mild SOB after coughing.  PA made aware of results.

## 2017-07-03 NOTE — ED Notes (Signed)
Attempted to call report at this time.  Receiving RN to call this RN back.  

## 2017-07-03 NOTE — Discharge Summary (Signed)
Name: Jacqueline Blackburn MRN: [id number] DOB: 1975/04/22 42 y.o. PCP: Patient, No Pcp Per  Date of Admission: 07/03/2017  2:45 AM Date of Discharge: 07/04/2017 Attending Physician: Burns Spain, MD  Discharge Diagnosis: 1. Dyspnea  2. Elevated troponin 2/2 demand ischemia 3. OSA 4. HFpEF 5. CKD stage V  Discharge Medications: Allergies as of 07/04/2017      Reactions   Lisinopril Other (See Comments)      Medication List    TAKE these medications   albuterol 108 (90 Base) MCG/ACT inhaler Commonly known as:  PROVENTIL HFA;VENTOLIN HFA Inhale 2 puffs into the lungs every 4 (four) hours as needed for wheezing or shortness of breath.   albuterol (2.5 MG/3ML) 0.083% nebulizer solution Commonly known as:  PROVENTIL Take 2.5 mg by nebulization every 6 (six) hours as needed for wheezing or shortness of breath.   aspirin EC 81 MG tablet Take 81 mg by mouth daily.   aspirin-acetaminophen-caffeine 250-250-65 MG tablet Commonly known as:  EXCEDRIN MIGRAINE Take 2 tablets by mouth every 6 (six) hours as needed for headache.   azithromycin 250 MG tablet Commonly known as:  ZITHROMAX Take 1 tablet (250 mg total) by mouth daily for 3 days.   carvedilol 25 MG tablet Commonly known as:  COREG Take 1 tablet (25 mg total) by mouth 2 (two) times daily with a meal.   cloNIDine 0.3 mg/24hr patch Commonly known as:  CATAPRES - Dosed in mg/24 hr Place 0.3 mg onto the skin once a week.   cloNIDine 0.1 mg/24hr patch Commonly known as:  CATAPRES - Dosed in mg/24 hr Place 0.1 mg onto the skin once a week.   fluticasone 50 MCG/ACT nasal spray Commonly known as:  FLONASE Place 1 spray into both nostrils 2 (two) times daily.   furosemide 80 MG tablet Commonly known as:  LASIX Take 1 tablet (80 mg total) by mouth 2 (two) times daily.   hydrALAZINE 100 MG tablet Commonly known as:  APRESOLINE Take 100 mg by mouth 3 (three) times daily.   isosorbide mononitrate 60 MG 24 hr  tablet Commonly known as:  IMDUR Take 60 mg by mouth daily.   meclizine 25 MG tablet Commonly known as:  ANTIVERT Take 25 mg by mouth every 8 (eight) hours as needed for dizziness.   mometasone-formoterol 200-5 MCG/ACT Aero Commonly known as:  DULERA Inhale 2 puffs into the lungs 2 (two) times daily.   NIFEdipine 60 MG 24 hr tablet Commonly known as:  PROCARDIA-XL/ADALAT CC Take 60 mg by mouth 2 (two) times daily.   nitroGLYCERIN 0.4 MG SL tablet Commonly known as:  NITROSTAT Place 0.4 mg under the tongue every 5 (five) minutes as needed for chest pain.   ondansetron 4 MG tablet Commonly known as:  ZOFRAN Take 4 mg by mouth every 8 (eight) hours as needed for nausea or vomiting.   predniSONE 20 MG tablet Commonly known as:  DELTASONE Take 2 tablets (40 mg total) by mouth daily with breakfast for 5 days. Start taking on:  07/06/2017 What changed:  These instructions start on 07/06/2017. If you are unsure what to do until then, ask your doctor or other care provider.   sodium bicarbonate 650 MG tablet Take 1 tablet (650 mg total) by mouth 3 (three) times daily.       Disposition and follow-up:   [name] was discharged from [facility] in Stable condition.  At the hospital follow up visit please address:  1.  COPD vs asthma exacerbation - Please assess respiratory symptoms. Patient was instructed to complete her azithromycin course on 5/12. She was given 5 additional days of 40 mg prednisone (10 days total) to be completed 5/17.   OSA - Please assist with getting outpatient CPAP machine.  HFpEF - Discharged with Lasix  BID, please assess volume status.  2.  Labs / imaging needed at time of follow-up: None  3.  Pending labs/ test needing follow-up: None  Follow-up Appointments:   Hospital Course by problem list:  1. COPD vs asthma exacerbation Patient re-admitted with continued symptoms of asthma vs COPD exacerbation in the setting of  continued environmental cigarette smoke exposure. Reported compliance with previous prescription of azithromycin and prednisone. No hypoxia was recorded, but she continued to endorse dyspnea. She was managed with IV steroids, scheduled breathing treatments, and symptomatic therapies. She was discharged on her home dulera BID, albuterol prn. Instructed to complete her previous azithromycin prescription (end date 5/12). Her prednisone course was extended for an additional 5 days (end date 5/17) for a total of 10 days.   2. Elevated troponin, hx of CAD Troponin peaked at 0.14, thought to be secondary to demand ischemia from COPD exacerbation. She endorsed chest pain on admission, however it was atypical with reassuring EKG and delta troponin trended down. 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  3. CKD stage V, not on dialysis She was previously on HD for 8 years due to HTN. Renal function was monitored and she was continued on her home Lasix with good urine output.  4. OSA Used CPAP while inpatient, please assist with getting outpatient CPAP machine.  5. HTN, HFpEF Her most recent Echo was in Feb 2019 with preserved EF and grade 1 diastolic dysfunction. Home medications were continued.   Discharge Vitals:   BP (!) 154/97 (BP Location: Right Arm)   Pulse 80   Temp 97.7 F (36.5 C) (Oral)   Resp 16   Ht  (1.702 m)   Wt 226 lb 14.4 oz (102.9 kg)   SpO2 96%   BMI 35.54 kg/m   Pertinent Labs, Studies, and Procedures:  CBC Latest Ref Rng & Units 07/03/2017 06/30/2017 04/14/2017  WBC 4.0 - 10.5 K/uL 16.2(H) 10.0 8.4  Hemoglobin 12.0 - 15.0 g/dL 08.6 57.8 46.9  Hematocrit 36.0 - 46.0 % 40.2 36.7 37.6  Platelets 150 - 400 K/uL 308 252 149(L)   CMP Latest Ref Rng & Units 07/04/2017 07/03/2017 07/01/2017  Glucose 65 - 99 mg/dL 629(B) 96 284(X)  BUN 6 - 20 mg/dL 32(G) 40(N) 02(V)  Creatinine 0.44 - 1.00 mg/dL 2.53(G) 6.44(I) 3.47(Q)  Sodium  135 - 145 mmol/L 139 137 139  Potassium 3.5 - 5.1 mmol/L 3.5 3.1(L) 3.4(L)  Chloride 101 - 111 mmol/L 104 100(L) 105  CO2 22 - 32 mmol/L 24 23 20(L)  Calcium 8.9 - 10.3 mg/dL 9.0 2.5(Z) 9.0  Total Protein 6.5 - 8.1 g/dL - - -  Total Bilirubin 0.3 - 1.2 mg/dL - - -  Alkaline Phos 38 - 126 U/L - - -  AST 15 - 41 U/L - - -  ALT 14 - 54 U/L - - -   Trop 0.14 -> 0.12 Beta hCG negative  CXR 07/03/2017 Stable borderline cardiomegaly with aortic atherosclerosis. Slight pulmonary vascular congestion without acute pulmonary consolidation or pneumothorax.  Discharge Instructions: Discharge Instructions    Diet - low sodium heart healthy   Complete by:  As directed    Discharge instructions   Complete by:  As directed    Ms. Anderegg,  It was a pleasure taking care of you. Please continue to take your antibiotic as previously prescribed. Your last pill should be tomorrow 5/12. I have given you 5 extra days of prednisone, please pick up this prescription from your pharmacy. Continue to take your other medicines as previously prescribed. Please follow up with your primary care provider in the next 1-2 weeks. Thank you!  - Dr. Antony Contras   Increase activity slowly   Complete by:  As directed       Signed: Reymundo Poll, MD 07/04/2017, 12:39 PM   Pager: Demetrius Charity [phone]

## 2017-07-03 NOTE — ED Notes (Signed)
Returned from xray

## 2017-07-04 DIAGNOSIS — R7989 Other specified abnormal findings of blood chemistry: Secondary | ICD-10-CM

## 2017-07-04 DIAGNOSIS — K219 Gastro-esophageal reflux disease without esophagitis: Secondary | ICD-10-CM | POA: Diagnosis not present

## 2017-07-04 DIAGNOSIS — I132 Hypertensive heart and chronic kidney disease with heart failure and with stage 5 chronic kidney disease, or end stage renal disease: Secondary | ICD-10-CM | POA: Diagnosis not present

## 2017-07-04 DIAGNOSIS — N186 End stage renal disease: Secondary | ICD-10-CM | POA: Diagnosis not present

## 2017-07-04 DIAGNOSIS — J441 Chronic obstructive pulmonary disease with (acute) exacerbation: Secondary | ICD-10-CM | POA: Diagnosis not present

## 2017-07-04 DIAGNOSIS — Z720 Tobacco use: Secondary | ICD-10-CM | POA: Diagnosis not present

## 2017-07-04 DIAGNOSIS — R110 Nausea: Secondary | ICD-10-CM

## 2017-07-04 DIAGNOSIS — Z79899 Other long term (current) drug therapy: Secondary | ICD-10-CM

## 2017-07-04 DIAGNOSIS — I5030 Unspecified diastolic (congestive) heart failure: Secondary | ICD-10-CM

## 2017-07-04 DIAGNOSIS — Z9119 Patient's noncompliance with other medical treatment and regimen: Secondary | ICD-10-CM

## 2017-07-04 DIAGNOSIS — Z7982 Long term (current) use of aspirin: Secondary | ICD-10-CM

## 2017-07-04 DIAGNOSIS — I2510 Atherosclerotic heart disease of native coronary artery without angina pectoris: Secondary | ICD-10-CM

## 2017-07-04 DIAGNOSIS — Z888 Allergy status to other drugs, medicaments and biological substances status: Secondary | ICD-10-CM

## 2017-07-04 DIAGNOSIS — G4733 Obstructive sleep apnea (adult) (pediatric): Secondary | ICD-10-CM

## 2017-07-04 DIAGNOSIS — R630 Anorexia: Secondary | ICD-10-CM

## 2017-07-04 LAB — BASIC METABOLIC PANEL
ANION GAP: 11 (ref 5–15)
BUN: 57 mg/dL — AB (ref 6–20)
CALCIUM: 9 mg/dL (ref 8.9–10.3)
CO2: 24 mmol/L (ref 22–32)
Chloride: 104 mmol/L (ref 101–111)
Creatinine, Ser: 4.36 mg/dL — ABNORMAL HIGH (ref 0.44–1.00)
GFR calc Af Amer: 13 mL/min — ABNORMAL LOW (ref >60)
GFR, EST NON AFRICAN AMERICAN: 12 mL/min — AB (ref >60)
Glucose, Bld: 134 mg/dL — ABNORMAL HIGH (ref 65–99)
Potassium: 3.5 mmol/L (ref 3.5–5.1)
Sodium: 139 mmol/L (ref 135–145)

## 2017-07-04 MED ORDER — IPRATROPIUM-ALBUTEROL 0.5-2.5 (3) MG/3ML IN SOLN
3.0000 mL | Freq: Three times a day (TID) | RESPIRATORY_TRACT | Status: DC
  Administered 2017-07-04 (×2): 3 mL via RESPIRATORY_TRACT
  Filled 2017-07-04: qty 3

## 2017-07-04 MED ORDER — LIDOCAINE 5 % EX PTCH
1.0000 | MEDICATED_PATCH | CUTANEOUS | Status: DC
  Administered 2017-07-04: 1 via TRANSDERMAL
  Filled 2017-07-04: qty 1

## 2017-07-04 MED ORDER — PREDNISONE 20 MG PO TABS: 40.0000 mg | ORAL_TABLET | Freq: Every day | ORAL | 0 refills | Status: AC

## 2017-07-04 MED ORDER — ONDANSETRON HCL 4 MG PO TABS
4.0000 mg | ORAL_TABLET | Freq: Three times a day (TID) | ORAL | Status: DC | PRN
  Administered 2017-07-04: 4 mg via ORAL
  Filled 2017-07-04: qty 1

## 2017-07-04 NOTE — Care Management Obs Status (Signed)
MEDICARE OBSERVATION STATUS NOTIFICATION   Patient Details  Name: Jacqueline Blackburn MRN: [id number] Date of Birth: 1975/06/12   Medicare Observation Status Notification Given:  Yes    Gala Lewandowsky, RN 07/04/2017, 1:28 PM

## 2017-07-04 NOTE — Progress Notes (Signed)
Discharge instructions, medications and f/u appt discussed and reviewed with pt, verbalized understanding. Copy of AVS given. Pt was escorted out of the unit in wheelchair.

## 2017-07-04 NOTE — Progress Notes (Signed)
  Date: 07/04/2017  Patient name: Jacqueline Blackburn  Medical record number: [id number]  Date of birth: [birth year]   I have seen and evaluated Jacqueline Blackburn and discussed their care with the Residency Team. Jacqueline Blackburn is a 42 yo female HTN, COPD, asthma, OSA, ESRD on HF but transitioned off HF, and tobacco use. She was admitted 5/7 - 5/8 for a COPD exac and D/C'd on azithro and prednisone. She returned with CP, dyspnea, and cough. Today she states she has had Cp, neck pain, shoulder pain, and HA non stop for 5-6 months and that manipulation / touch makes it worse. Lidoderm patches makes it better. She c/o N and anorexia this AM.  Vitals:   07/04/17 0553 07/04/17 0848  BP: (!) 154/97   Pulse: 80   Resp: 16   Temp: 97.7 F (36.5 C)   SpO2: 99% 96%  NAD, talkative HRRR no MRG LCTAB with good air flow  Cr at baseline Trop 0.12  I personally viewed the CXR images and confirmed my reading with the official read. 2 view, slight over penetration, mild interstitial edema  I personally viewed the EKG and confirmed my reading with the official read. Sinus, nl axis, lateral TWI, LVH and LAH  Assessment and Plan: I have seen and evaluated the patient as outlined above. I agree with the formulated Assessment and Plan as detailed in the residents' note, with the following changes:  Jacqueline Blackburn is a 42 yo female HTN, COPD, asthma, OSA, ESRD on HF but transitioned off HF, and tobacco use. She presents partially through a COPD exac tx course with 5-6 months continuous CP, shoulder pain, neck pain, and HA. She has had 4 admissions since Feb for similar sxs. She has R/O for a life threatening cause. It is probable that this is a Jacqueline cause / fibromyalgia. However, she will need outpt care to further assess this. Pt is moving to [facility] soon.     Burns Spain, MD 5/11/20192:35 PM

## 2017-07-04 NOTE — Progress Notes (Signed)
   Subjective: Reports breathing has improved. No complaints.   Objective:  Vital signs in last 24 hours: Vitals:   07/03/17 1855 07/03/17 2059 07/03/17 2209 07/04/17 0553  BP: (!) 221/118 (!) 175/111  (!) 154/97  Pulse: 88 76  80  Resp: Temp: 97.9 F (36.6 C) 98.7 F (37.1 C)  97.7 F (36.5 C)  TempSrc: Oral Oral  Oral  SpO2: 97% 100% 99% 99%  Weight:  226 lb 14.4 oz (102.9 kg)    Height:       Physical Exam Constitutional: Sleeping comfortably, not wearing CPAP  Cardiovascular: RRR, no murmurs, rubs, or gallops.  Pulmonary/Chest: CTAB, no wheezes, rales, or rhonchi.  Extremities: Warm and well perfused. No edema.  Psychiatric: Normal mood and affect  Assessment/Plan:  42 yo F with pmhx of COPD, Asthma, CKD V, non-obstructive CAD, HTN, GERD presenting with chest pain, continued coughing & wheezing since recent discharge, readmitted for on-going COPD exacerbation.   COPD Exacerbation: She is presenting with continued symptoms of asthma vs COPD exacerbation in the setting of continued environmental exposures in the home from cigarette smoke that are likely preventing her from getting well. Breathing comfortably on RA, no respiratory distress. Lungs are clear bilaterally. Symptoms improved.  - Continue azithromycin with stop date 5/12 - Solu-Medrol  daily --> PO prednisone on discharge  - Scheduled Duonebs and Albuterol PRN - Continue Dulera - Continue Flonase - Counsel on smoking cessation and avoidance of triggers as best as possible - Robitussin-DM for cough - Voltaren gel for likely MSK pain related to coughing  CKD She was previously on HD for 8 years due to HTN.  Her renal function is at baseline from discharge 2 days ago.  She reports taking her Lasix as prescribed and making good urine output.  - Follow renal function - Continue Lasix  HFpEF CAD HTN Her most recent Echo was in Feb 2019 with preserved EF and grade 1 diastolic dysfunction. 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 Her chest pain on admission was atypical with reassuring EKG and delta troponin down-trending. Home medications include Lasix, Coreg, Hydralazine, Clonidine patch, Imdur, Nicardipine. These were given last hospitalization as well.  - Lasix  BID - ASA  daily - Coreg  BID - Hydralazine  TID - Nifedipine  BID - Clonidine patch replaced every Friday - Imdur  daily  OSA Non adherent with CPAP at home. - CPAP QHS  FEN Fluids: None Electrolytes: Replace PRN.  Nutrition: Renal diet  DVT PPx: Heparin SQ  CODE:FULL   Dispo: Anticipated discharge today.   Reymundo Poll, MD 07/04/2017, 7:29 AM Pager: [phone]

## 2018-06-17 IMAGING — CR DG CHEST 2V
2 series · 2 of 2 positions shown · non-contrast
Comparison: 04/12/2017

CLINICAL DATA: Chest discomfort, dyspnea and cough

EXAM:
CHEST - 2 VIEW

[chest pa]
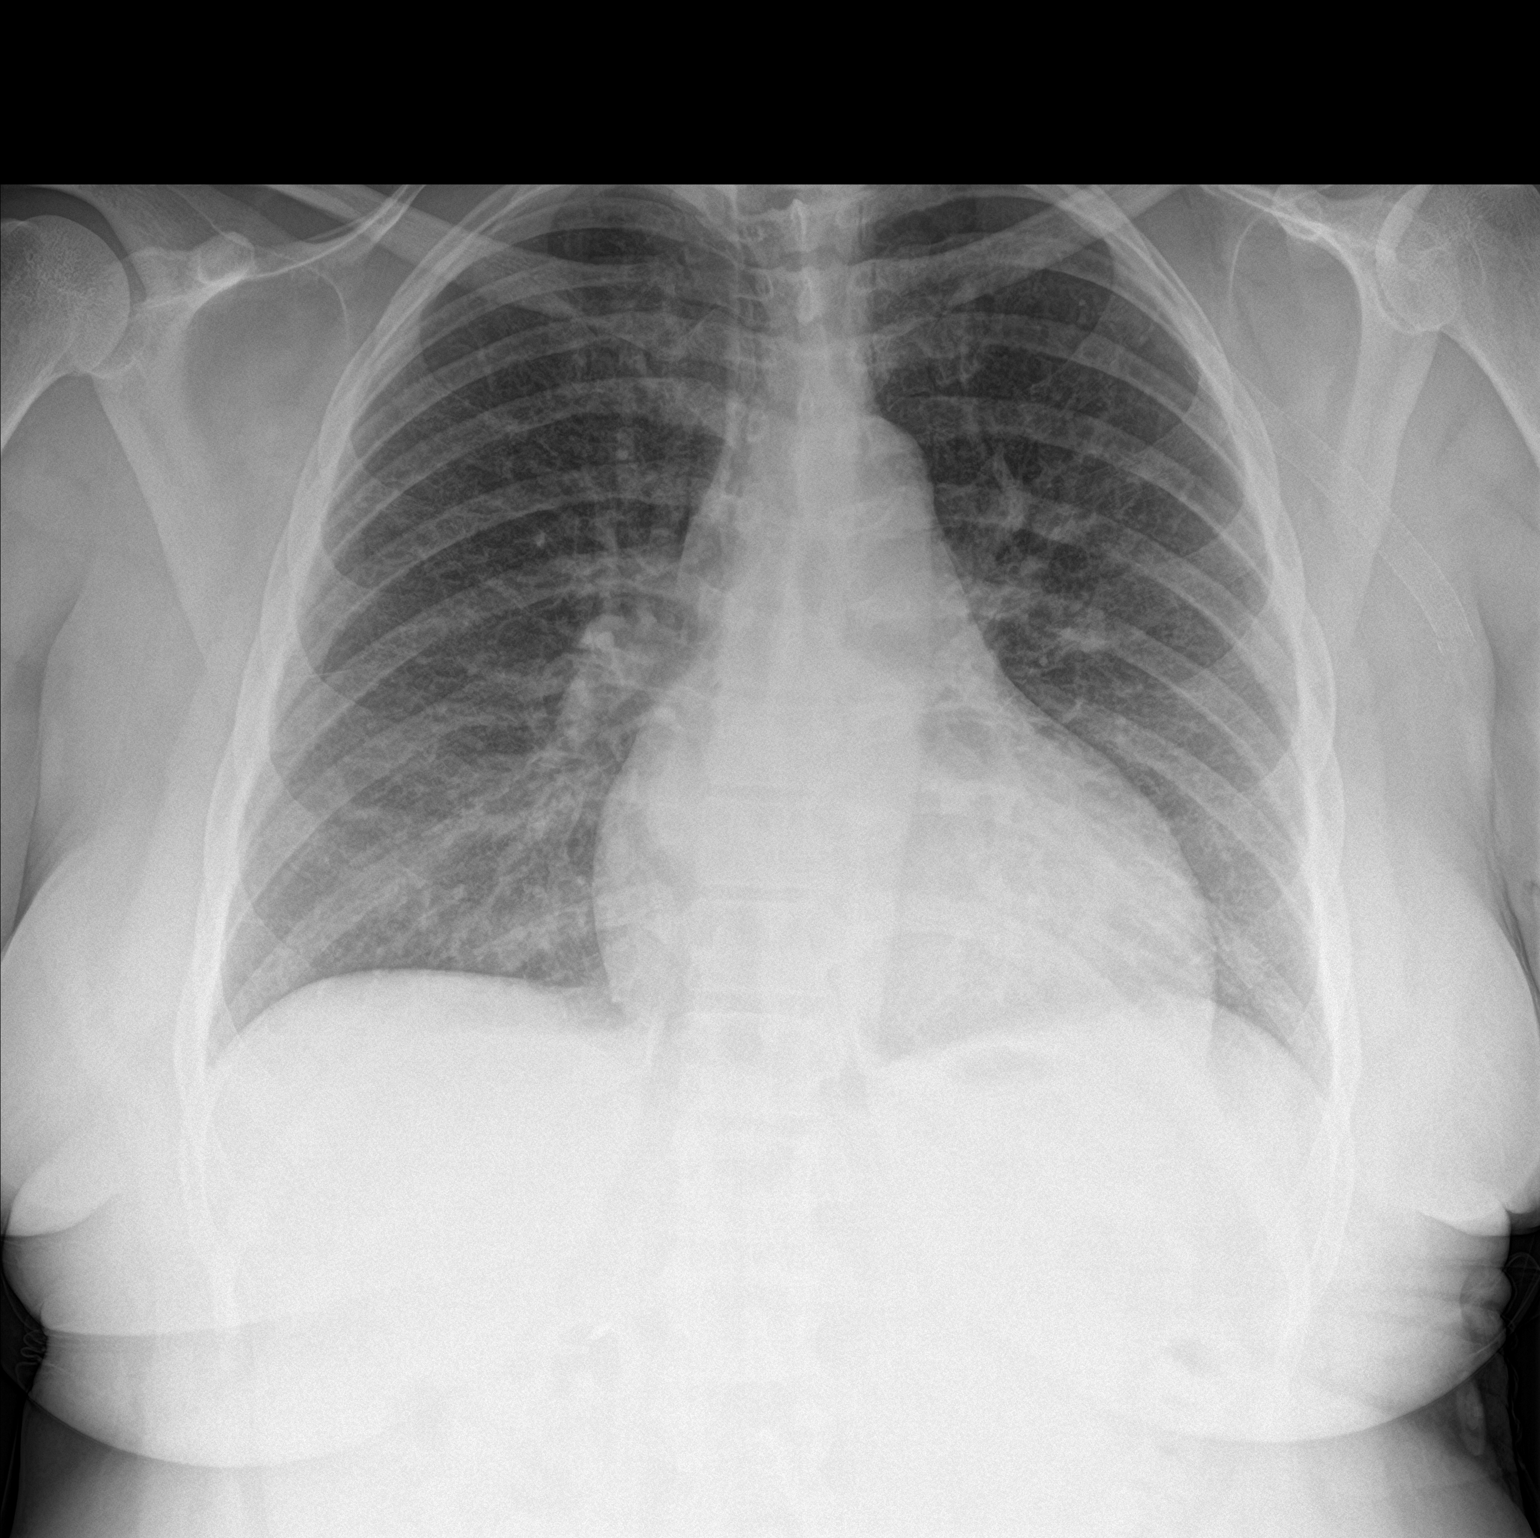

[chest lat]
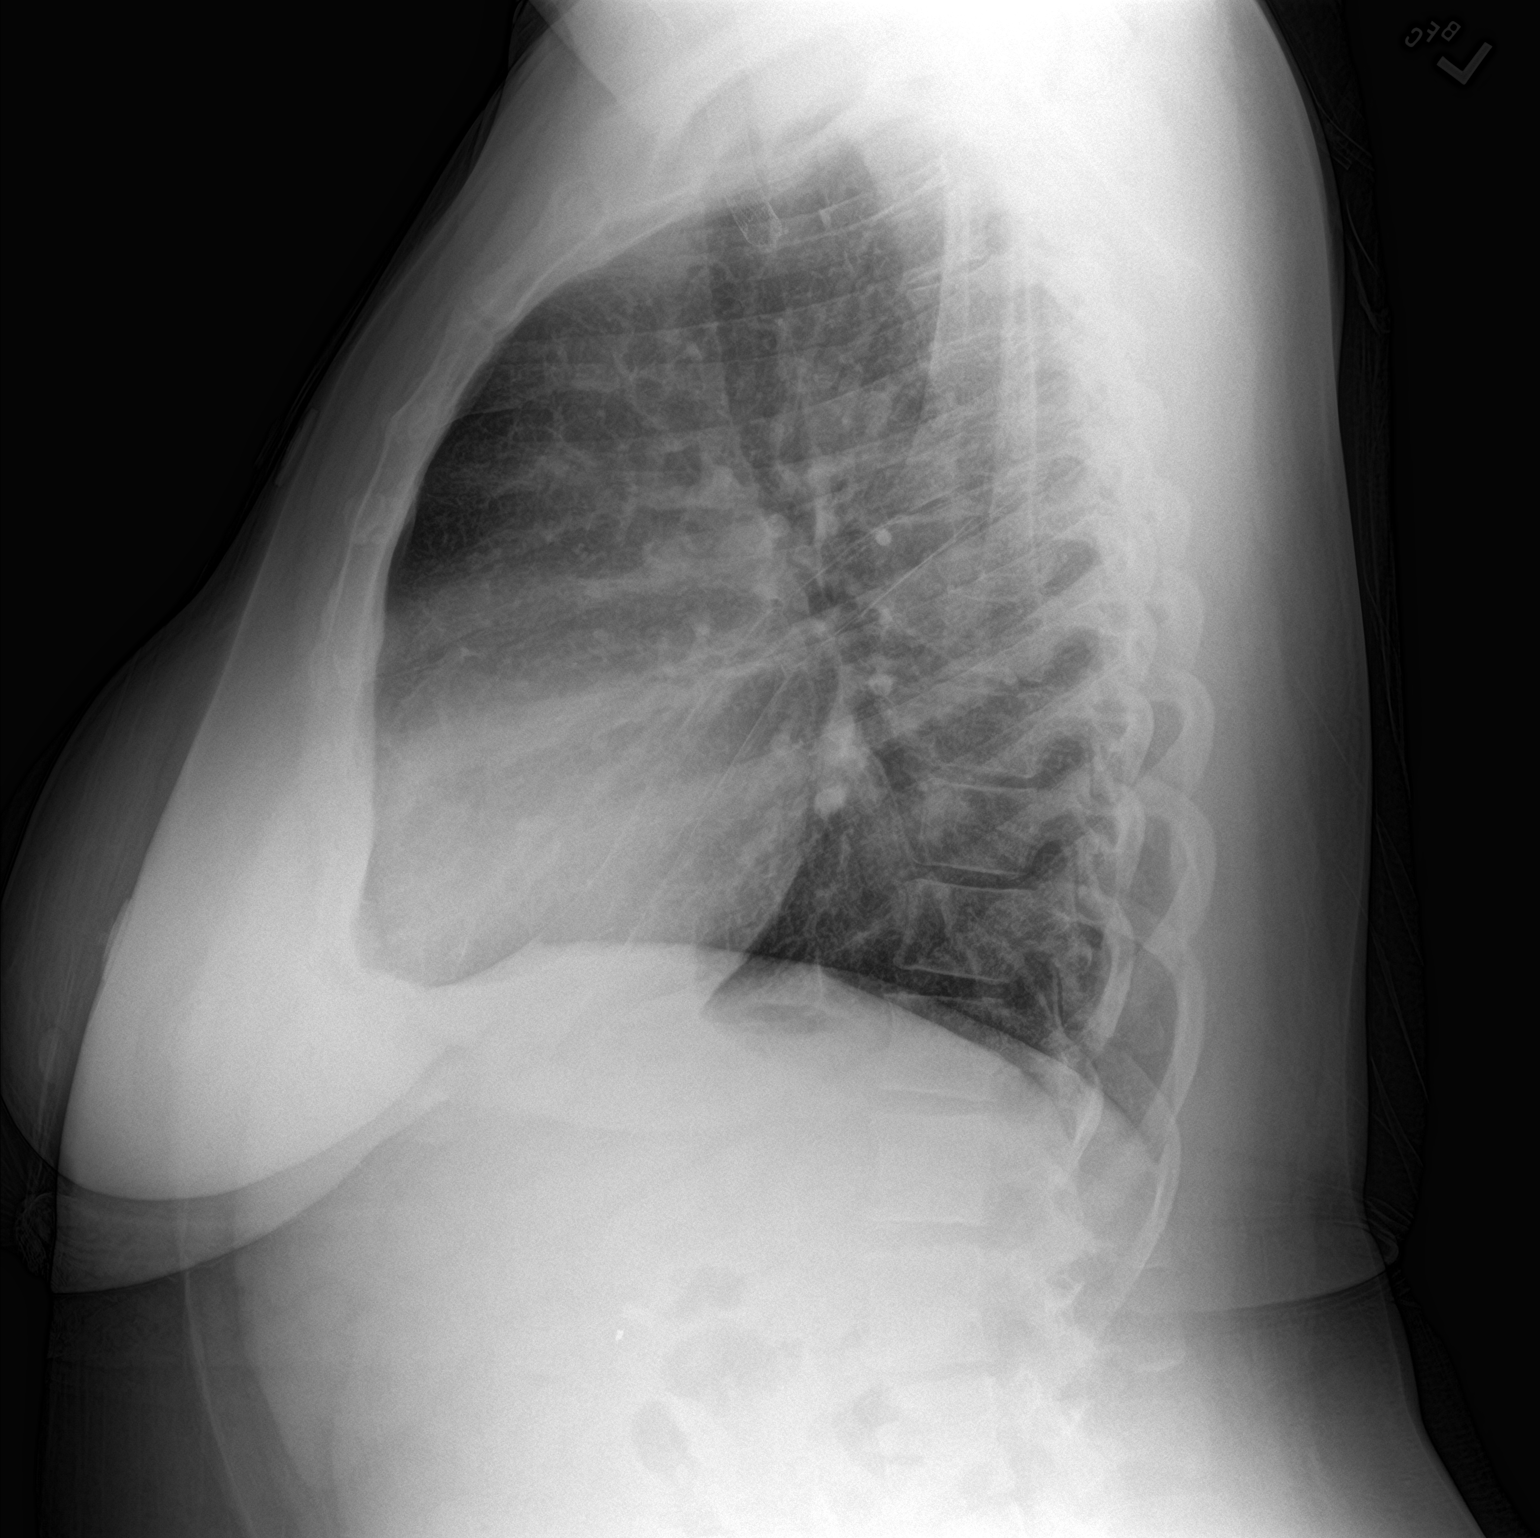

[2 of 2 positions shown; findings below may reference images not displayed]

FINDINGS: The heart size and mediastinal contours are within normal limits.
Mild pulmonary vascular congestion. Both lungs are clear. The
visualized skeletal structures are unremarkable.
IMPRESSION: Mild vascular congestion similar to prior. No pulmonary
consolidation, effusion or pneumothorax.

## 2019-02-14 DIAGNOSIS — J811 Chronic pulmonary edema: Secondary | ICD-10-CM | POA: Insufficient documentation

## 2020-03-26 DIAGNOSIS — E782 Mixed hyperlipidemia: Secondary | ICD-10-CM | POA: Insufficient documentation

## 2020-03-26 DIAGNOSIS — I252 Old myocardial infarction: Secondary | ICD-10-CM | POA: Insufficient documentation

## 2020-10-22 DIAGNOSIS — N2581 Secondary hyperparathyroidism of renal origin: Secondary | ICD-10-CM | POA: Diagnosis present

## 2020-11-27 ENCOUNTER — Other Ambulatory Visit: Payer: Self-pay

## 2020-11-27 ENCOUNTER — Encounter (HOSPITAL_COMMUNITY): Payer: Self-pay

## 2020-11-27 ENCOUNTER — Inpatient Hospital Stay (HOSPITAL_COMMUNITY)
Admission: AD | Admit: 2020-11-27 | Payer: Self-pay | Source: Other Acute Inpatient Hospital | Admitting: Internal Medicine

## 2020-12-03 ENCOUNTER — Encounter (HOSPITAL_COMMUNITY): Payer: Self-pay

## 2020-12-04 ENCOUNTER — Inpatient Hospital Stay
Admission: AD | Admit: 2020-12-04 | Discharge: 2020-12-13 | DRG: 252 | Disposition: A | Discharge status: Home / Self Care | Payer: Commercial Managed Care - PPO | Source: Other Acute Inpatient Hospital | Attending: Vascular & Interventional Radiology | Admitting: Vascular & Interventional Radiology

## 2020-12-04 ENCOUNTER — Inpatient Hospital Stay (HOSPITAL_COMMUNITY): Payer: Commercial Managed Care - PPO

## 2020-12-04 ENCOUNTER — Encounter (HOSPITAL_COMMUNITY): Payer: Self-pay | Admitting: Hospitalist

## 2020-12-04 DIAGNOSIS — N2581 Secondary hyperparathyroidism of renal origin: Secondary | ICD-10-CM | POA: Diagnosis present

## 2020-12-04 DIAGNOSIS — R5383 Other fatigue: Secondary | ICD-10-CM

## 2020-12-04 DIAGNOSIS — I871 Compression of vein: Principal | ICD-10-CM | POA: Diagnosis present

## 2020-12-04 DIAGNOSIS — I132 Hypertensive heart and chronic kidney disease with heart failure and with stage 5 chronic kidney disease, or end stage renal disease: Secondary | ICD-10-CM | POA: Diagnosis present

## 2020-12-04 DIAGNOSIS — F17210 Nicotine dependence, cigarettes, uncomplicated: Secondary | ICD-10-CM | POA: Diagnosis present

## 2020-12-04 DIAGNOSIS — G4730 Sleep apnea, unspecified: Secondary | ICD-10-CM | POA: Diagnosis present

## 2020-12-04 DIAGNOSIS — Z029 Encounter for administrative examinations, unspecified: Secondary | ICD-10-CM

## 2020-12-04 DIAGNOSIS — Z853 Personal history of malignant neoplasm of breast: Secondary | ICD-10-CM

## 2020-12-04 DIAGNOSIS — Z6835 Body mass index (BMI) 35.0-35.9, adult: Secondary | ICD-10-CM

## 2020-12-04 DIAGNOSIS — I509 Heart failure, unspecified: Secondary | ICD-10-CM | POA: Diagnosis present

## 2020-12-04 DIAGNOSIS — Z7982 Long term (current) use of aspirin: Secondary | ICD-10-CM

## 2020-12-04 DIAGNOSIS — E785 Hyperlipidemia, unspecified: Secondary | ICD-10-CM | POA: Diagnosis present

## 2020-12-04 DIAGNOSIS — Z803 Family history of malignant neoplasm of breast: Secondary | ICD-10-CM

## 2020-12-04 DIAGNOSIS — J449 Chronic obstructive pulmonary disease, unspecified: Secondary | ICD-10-CM | POA: Diagnosis present

## 2020-12-04 DIAGNOSIS — Z888 Allergy status to other drugs, medicaments and biological substances status: Secondary | ICD-10-CM

## 2020-12-04 DIAGNOSIS — N186 End stage renal disease: Secondary | ICD-10-CM | POA: Diagnosis present

## 2020-12-04 DIAGNOSIS — F17200 Nicotine dependence, unspecified, uncomplicated: Secondary | ICD-10-CM

## 2020-12-04 DIAGNOSIS — Z7951 Long term (current) use of inhaled steroids: Secondary | ICD-10-CM

## 2020-12-04 DIAGNOSIS — G4700 Insomnia, unspecified: Secondary | ICD-10-CM | POA: Diagnosis present

## 2020-12-04 DIAGNOSIS — E669 Obesity, unspecified: Secondary | ICD-10-CM | POA: Diagnosis present

## 2020-12-04 DIAGNOSIS — N61 Mastitis without abscess: Secondary | ICD-10-CM | POA: Diagnosis present

## 2020-12-04 DIAGNOSIS — K219 Gastro-esophageal reflux disease without esophagitis: Secondary | ICD-10-CM | POA: Diagnosis present

## 2020-12-04 DIAGNOSIS — Z79899 Other long term (current) drug therapy: Secondary | ICD-10-CM

## 2020-12-04 DIAGNOSIS — E871 Hypo-osmolality and hyponatremia: Secondary | ICD-10-CM | POA: Diagnosis present

## 2020-12-04 DIAGNOSIS — M7989 Other specified soft tissue disorders: Secondary | ICD-10-CM

## 2020-12-04 DIAGNOSIS — D631 Anemia in chronic kidney disease: Secondary | ICD-10-CM | POA: Diagnosis present

## 2020-12-04 DIAGNOSIS — Z992 Dependence on renal dialysis: Secondary | ICD-10-CM

## 2020-12-04 DIAGNOSIS — I10 Essential (primary) hypertension: Secondary | ICD-10-CM

## 2020-12-04 DIAGNOSIS — E875 Hyperkalemia: Secondary | ICD-10-CM | POA: Diagnosis not present

## 2020-12-04 HISTORY — DX: Sleep apnea, unspecified: G47.30

## 2020-12-04 HISTORY — DX: Unspecified asthma, uncomplicated: J45.909

## 2020-12-04 HISTORY — DX: End stage renal disease (CMS HCC): N18.6

## 2020-12-04 HISTORY — DX: Gastro-esophageal reflux disease without esophagitis: K21.9

## 2020-12-04 LAB — BASIC METABOLIC PANEL
ANION GAP: 4 mmol/L
BUN/CREA RATIO: 8
BUN: 90 mg/dL — ABNORMAL HIGH (ref 10–25)
CALCIUM: 9 mg/dL (ref 8.8–10.3)
CHLORIDE: 95 mmol/L — ABNORMAL LOW (ref 98–111)
CO2 TOTAL: 24 mmol/L (ref 21–35)
CREATININE: 10.86 mg/dL — ABNORMAL HIGH (ref <=1.30)
ESTIMATED GFR: 4 mL/min/{1.73_m2} — ABNORMAL LOW
GLUCOSE: 160 mg/dL — ABNORMAL HIGH (ref 70–110)
POTASSIUM: 4.5 mmol/L (ref 3.5–5.0)
SODIUM: 123 mmol/L — ABNORMAL LOW (ref 135–145)

## 2020-12-04 LAB — CBC WITH DIFF
BASOPHIL #: <0.1 10*3/uL (ref <=0.20)
BASOPHIL %: 0 %
EOSINOPHIL #: <0.1 10*3/uL (ref <=0.50)
EOSINOPHIL %: 0 %
HCT: 33.6 % — ABNORMAL LOW (ref 34.8–46.0)
HGB: 10.7 g/dL — ABNORMAL LOW (ref 11.5–16.0)
IMMATURE GRANULOCYTE #: 0.1 10*3/uL — ABNORMAL HIGH (ref <0.10)
IMMATURE GRANULOCYTE %: 1 % (ref 0–1)
LYMPHOCYTE #: 0.44 10*3/uL — ABNORMAL LOW (ref 1.00–4.80)
LYMPHOCYTE %: 4 %
MCH: 29.2 pg (ref 26.0–32.0)
MCHC: 31.8 g/dL (ref 31.0–35.5)
MCV: 91.6 fL (ref 78.0–100.0)
MONOCYTE #: 0.3 10*3/uL (ref 0.20–1.10)
MONOCYTE %: 2 %
MPV: 9 fL (ref 8.7–12.5)
NEUTROPHIL #: 11.45 10*3/uL — ABNORMAL HIGH (ref 1.50–7.70)
NEUTROPHIL %: 93 %
PLATELETS: 352 10*3/uL (ref 150–400)
RBC: 3.67 10*6/uL — ABNORMAL LOW (ref 3.85–5.22)
RDW-CV: 17.2 % — ABNORMAL HIGH (ref 11.5–15.5)
WBC: 12.3 10*3/uL — ABNORMAL HIGH (ref 3.7–11.0)

## 2020-12-04 LAB — PHOSPHORUS: PHOSPHORUS: 5.9 mg/dL — ABNORMAL HIGH (ref 2.5–4.5)

## 2020-12-04 LAB — D-DIMER: D-DIMER: 650 ng/mL DDU — ABNORMAL HIGH (ref 200–229)

## 2020-12-04 LAB — MAGNESIUM: MAGNESIUM: 2.1 mg/dL (ref 1.8–2.3)

## 2020-12-04 MED ORDER — HYDROMORPHONE (PF) 0.5 MG/0.5 ML INJECTION SYRINGE
0.2000 mg | INJECTION | INTRAMUSCULAR | Status: DC | PRN
Start: 2020-12-04 — End: 2020-12-06
  Administered 2020-12-04 – 2020-12-06 (×6): 0.2 mg via INTRAVENOUS
  Filled 2020-12-04 (×6): qty 0.5

## 2020-12-04 MED ORDER — CARVEDILOL 12.5 MG TABLET
12.5000 mg | ORAL_TABLET | Freq: Two times a day (BID) | ORAL | Status: DC
Start: 2020-12-04 — End: 2020-12-09
  Administered 2020-12-04 – 2020-12-05 (×3): 12.5 mg via ORAL
  Administered 2020-12-06: 0 mg via ORAL
  Administered 2020-12-06 – 2020-12-08 (×4): 12.5 mg via ORAL
  Administered 2020-12-08: 09:00:00 0 mg via ORAL
  Administered 2020-12-09: 08:00:00 12.5 mg via ORAL
  Filled 2020-12-04 (×9): qty 1

## 2020-12-04 MED ORDER — LABETALOL 200 MG TABLET
200.0000 mg | ORAL_TABLET | Freq: Three times a day (TID) | ORAL | Status: DC
Start: 2020-12-04 — End: 2020-12-14
  Administered 2020-12-04 – 2020-12-12 (×24): 0 mg via ORAL

## 2020-12-04 MED ORDER — NICOTINE 14 MG/24 HR DAILY TRANSDERMAL PATCH
14.0000 mg | MEDICATED_PATCH | Freq: Every day | TRANSDERMAL | Status: DC
Start: 2020-12-04 — End: 2020-12-14
  Administered 2020-12-04 – 2020-12-07 (×4): 14 mg via TRANSDERMAL
  Administered 2020-12-08: 09:00:00 0 mg via TRANSDERMAL
  Administered 2020-12-09: 08:00:00 14 mg via TRANSDERMAL
  Administered 2020-12-10: 08:00:00 0 mg via TRANSDERMAL
  Administered 2020-12-10 – 2020-12-13 (×4): 14 mg via TRANSDERMAL
  Filled 2020-12-04 (×10): qty 1

## 2020-12-04 MED ORDER — SPIRONOLACTONE 25 MG TABLET
25.0000 mg | ORAL_TABLET | Freq: Every day | ORAL | Status: DC
Start: 2020-12-05 — End: 2020-12-14
  Administered 2020-12-05 – 2020-12-07 (×3): 25 mg via ORAL
  Administered 2020-12-08: 09:00:00 0 mg via ORAL
  Administered 2020-12-09 – 2020-12-13 (×5): 25 mg via ORAL
  Filled 2020-12-04 (×8): qty 1

## 2020-12-04 MED ORDER — HYDROXYZINE PAMOATE 25 MG CAPSULE
25.0000 mg | ORAL_CAPSULE | Freq: Every evening | ORAL | Status: DC | PRN
Start: 2020-12-04 — End: 2020-12-14
  Administered 2020-12-05 – 2020-12-11 (×4): 25 mg via ORAL
  Filled 2020-12-04 (×4): qty 1

## 2020-12-04 MED ORDER — CINACALCET 30 MG TABLET
30.0000 mg | ORAL_TABLET | Freq: Every day | ORAL | Status: DC
Start: 2020-12-05 — End: 2020-12-06
  Administered 2020-12-05 – 2020-12-06 (×2): 0 mg via ORAL
  Filled 2020-12-04 (×3): qty 1

## 2020-12-04 MED ORDER — ASPIRIN 81 MG TABLET,DELAYED RELEASE
81.0000 mg | DELAYED_RELEASE_TABLET | Freq: Every day | ORAL | Status: DC
Start: 2020-12-05 — End: 2020-12-14
  Administered 2020-12-05 – 2020-12-07 (×3): 81 mg via ORAL
  Administered 2020-12-08: 09:00:00 0 mg via ORAL
  Administered 2020-12-09 – 2020-12-13 (×5): 81 mg via ORAL
  Filled 2020-12-04 (×8): qty 1

## 2020-12-04 MED ORDER — ISOSORBIDE MONONITRATE ER 60 MG TABLET,EXTENDED RELEASE 24 HR
60.0000 mg | ORAL_TABLET | Freq: Every morning | ORAL | Status: DC
Start: 2020-12-05 — End: 2020-12-14
  Administered 2020-12-05 – 2020-12-07 (×3): 60 mg via ORAL
  Administered 2020-12-08: 06:00:00 0 mg via ORAL
  Administered 2020-12-09 – 2020-12-13 (×5): 60 mg via ORAL
  Filled 2020-12-04 (×8): qty 1

## 2020-12-04 MED ORDER — SODIUM CHLORIDE 0.9 % (FLUSH) INJECTION SYRINGE
3.0000 mL | INJECTION | INTRAMUSCULAR | Status: DC | PRN
Start: 2020-12-04 — End: 2020-12-14
  Administered 2020-12-04: 3 mL

## 2020-12-04 MED ORDER — CLONIDINE HCL 0.1 MG TABLET
0.1000 mg | ORAL_TABLET | Freq: Three times a day (TID) | ORAL | Status: DC
Start: 2020-12-04 — End: 2020-12-14
  Administered 2020-12-04 – 2020-12-05 (×2): 0.1 mg via ORAL
  Administered 2020-12-05: 14:00:00 0 mg via ORAL
  Administered 2020-12-05: 10:00:00 0.1 mg via ORAL
  Administered 2020-12-06: 0 mg via ORAL
  Administered 2020-12-06 – 2020-12-08 (×6): 0.1 mg via ORAL
  Administered 2020-12-08: 08:00:00 0 mg via ORAL
  Administered 2020-12-08 – 2020-12-13 (×16): 0.1 mg via ORAL
  Filled 2020-12-04 (×25): qty 1

## 2020-12-04 MED ORDER — SODIUM CHLORIDE 0.9 % (FLUSH) INJECTION SYRINGE
3.0000 mL | INJECTION | Freq: Three times a day (TID) | INTRAMUSCULAR | Status: DC
Start: 2020-12-04 — End: 2020-12-14
  Administered 2020-12-04: 3 mL
  Administered 2020-12-05 (×2): 0 mL
  Administered 2020-12-05 – 2020-12-06 (×2): 3 mL
  Administered 2020-12-06 (×2): 0 mL
  Administered 2020-12-07: 3 mL
  Administered 2020-12-07 – 2020-12-08 (×4): 0 mL
  Administered 2020-12-08: 3 mL
  Administered 2020-12-09 – 2020-12-10 (×5): 0 mL
  Administered 2020-12-10: 3 mL
  Administered 2020-12-11: 06:00:00 0 mL
  Administered 2020-12-11 – 2020-12-12 (×3): 3 mL
  Administered 2020-12-12 – 2020-12-13 (×4): 0 mL
  Administered 2020-12-13: 21:00:00 3 mL

## 2020-12-04 MED ORDER — MONTELUKAST 10 MG TABLET
10.0000 mg | ORAL_TABLET | Freq: Every evening | ORAL | Status: DC
Start: 2020-12-04 — End: 2020-12-14
  Administered 2020-12-04 – 2020-12-13 (×10): 10 mg via ORAL
  Filled 2020-12-04 (×10): qty 1

## 2020-12-04 MED ORDER — HEPARIN (PORCINE) 10,000 UNIT/ML INJECTION SOLUTION
5000.0000 [IU] | Freq: Three times a day (TID) | INTRAMUSCULAR | Status: DC
Start: 2020-12-04 — End: 2020-12-07
  Administered 2020-12-04 – 2020-12-06 (×6): 0 [IU] via SUBCUTANEOUS
  Administered 2020-12-06: 12:00:00 5000 [IU] via SUBCUTANEOUS
  Administered 2020-12-07 (×2): 0 [IU] via SUBCUTANEOUS
  Filled 2020-12-04 (×6): qty 1

## 2020-12-04 MED ORDER — HYDRALAZINE 50 MG TABLET
100.0000 mg | ORAL_TABLET | Freq: Three times a day (TID) | ORAL | Status: DC
Start: 2020-12-04 — End: 2020-12-14
  Administered 2020-12-04 – 2020-12-05 (×3): 100 mg via ORAL
  Administered 2020-12-05: 14:00:00 0 mg via ORAL
  Administered 2020-12-06 (×2): 100 mg via ORAL
  Administered 2020-12-06: 0 mg via ORAL
  Administered 2020-12-07 – 2020-12-08 (×4): 100 mg via ORAL
  Administered 2020-12-08: 08:00:00 0 mg via ORAL
  Administered 2020-12-08 – 2020-12-13 (×16): 100 mg via ORAL
  Filled 2020-12-04 (×25): qty 2

## 2020-12-04 MED ORDER — FUROSEMIDE 40 MG TABLET
80.0000 mg | ORAL_TABLET | Freq: Two times a day (BID) | ORAL | Status: DC
Start: 2020-12-04 — End: 2020-12-14
  Administered 2020-12-04: 19:00:00 80 mg via ORAL
  Administered 2020-12-05: 17:00:00 0 mg via ORAL
  Administered 2020-12-05 – 2020-12-08 (×6): 80 mg via ORAL
  Administered 2020-12-08: 09:00:00 0 mg via ORAL
  Administered 2020-12-09 – 2020-12-13 (×10): 80 mg via ORAL
  Filled 2020-12-04 (×17): qty 2

## 2020-12-04 MED ORDER — DILTIAZEM 60 MG TABLET
120.0000 mg | ORAL_TABLET | Freq: Three times a day (TID) | ORAL | Status: DC
Start: 2020-12-04 — End: 2020-12-14
  Administered 2020-12-04 – 2020-12-05 (×2): 120 mg via ORAL
  Administered 2020-12-05: 0 mg via ORAL
  Administered 2020-12-05: 10:00:00 120 mg via ORAL
  Administered 2020-12-06: 0 mg via ORAL
  Administered 2020-12-06 – 2020-12-07 (×5): 120 mg via ORAL
  Administered 2020-12-08: 08:00:00 0 mg via ORAL
  Administered 2020-12-08 – 2020-12-13 (×17): 120 mg via ORAL
  Filled 2020-12-04 (×25): qty 2

## 2020-12-04 MED ORDER — DOXAZOSIN 4 MG TABLET
4.0000 mg | ORAL_TABLET | Freq: Every evening | ORAL | Status: DC
Start: 2020-12-04 — End: 2020-12-14
  Administered 2020-12-04 – 2020-12-13 (×10): 4 mg via ORAL
  Filled 2020-12-04 (×18): qty 1

## 2020-12-04 MED ORDER — ONDANSETRON HCL (PF) 4 MG/2 ML INJECTION SOLUTION
4.0000 mg | Freq: Four times a day (QID) | INTRAMUSCULAR | Status: DC | PRN
Start: 2020-12-04 — End: 2020-12-14
  Administered 2020-12-07 – 2020-12-11 (×2): 4 mg via INTRAVENOUS
  Filled 2020-12-04 (×2): qty 2

## 2020-12-04 MED ORDER — PANTOPRAZOLE 40 MG TABLET,DELAYED RELEASE
40.0000 mg | DELAYED_RELEASE_TABLET | Freq: Every day | ORAL | Status: DC
Start: 2020-12-05 — End: 2020-12-14
  Administered 2020-12-05 – 2020-12-07 (×3): 40 mg via ORAL
  Administered 2020-12-08: 09:00:00 0 mg via ORAL
  Administered 2020-12-09 – 2020-12-13 (×5): 40 mg via ORAL
  Filled 2020-12-04 (×8): qty 1

## 2020-12-04 MED ORDER — LORATADINE 10 MG TABLET
10.0000 mg | ORAL_TABLET | Freq: Every day | ORAL | Status: DC
Start: 2020-12-05 — End: 2020-12-14
  Administered 2020-12-05 – 2020-12-07 (×3): 10 mg via ORAL
  Administered 2020-12-08: 09:00:00 0 mg via ORAL
  Administered 2020-12-09 – 2020-12-13 (×5): 10 mg via ORAL
  Filled 2020-12-04 (×8): qty 1

## 2020-12-04 MED ORDER — LOSARTAN 50 MG TABLET
50.0000 mg | ORAL_TABLET | Freq: Every day | ORAL | Status: DC
Start: 2020-12-05 — End: 2020-12-14
  Administered 2020-12-05 – 2020-12-07 (×3): 50 mg via ORAL
  Administered 2020-12-08: 0 mg via ORAL
  Administered 2020-12-09 – 2020-12-13 (×5): 50 mg via ORAL
  Filled 2020-12-04 (×9): qty 1

## 2020-12-04 MED ORDER — ACETAMINOPHEN 325 MG TABLET
650.0000 mg | ORAL_TABLET | Freq: Four times a day (QID) | ORAL | Status: DC | PRN
Start: 2020-12-04 — End: 2020-12-14
  Administered 2020-12-06 – 2020-12-13 (×2): 650 mg via ORAL
  Filled 2020-12-04 (×2): qty 2

## 2020-12-04 MED ORDER — IPRATROPIUM 0.5 MG-ALBUTEROL 3 MG (2.5 MG BASE)/3 ML NEBULIZATION SOLN
3.0000 mL | INHALATION_SOLUTION | Freq: Four times a day (QID) | RESPIRATORY_TRACT | Status: DC | PRN
Start: 2020-12-04 — End: 2020-12-14
  Administered 2020-12-13: 09:00:00 3 mL via RESPIRATORY_TRACT
  Filled 2020-12-04: qty 3

## 2020-12-04 NOTE — H&P (Signed)
[facility]  Hospitalist  Admission H&P    Date of Service:  12/04/2020  Kellieann, Lecomte, 45 y.o. female  Date of Admission:  12/04/2020  Date of Birth:  10/04/1975  PCP: Cam Hai, FNP    LAY CAREGIVER   Appointed Lay Caregiver?: I Decline     Information Obtained from: patient  Chief Complaint:  Swelling    HPI: Bay Kaszynski is a 45 y.o., 49 American female with a PMH of ESRD, resistant HTN, COPD, tobacco dependence, and GERD who presents with as a transfer from [facility] with concern for SVC syndrome. The patient reports she initially presented to [facility] mid September due to what appears to be Hypertensive Emergency and was placed in the ICU and started on IV anti-hypertensive and spent 2 weeks in the hospital. She was discharged on 9/30 and started developing worsening R sided pain and swelling. She reports her R face, neck, arm, breast and abdomen was significantly swollen. She returned back to [facility] on 10/4 and was admitted and treated for cellulitis with antibiotics. Her symptoms did not improve significantly. During this time, her primary nephrologist (Dr. Liliane Shi) returned to provide her care and felt that this was not cellulitis and likely had some other pathology. It appears that there was some difference of opinion between her treating physicians as per patient. She did eventually undergo a CTA angio of the chest which showed moderate narrowing of the distal R subclavian vein at its junction. She was also noted to have significant subcutaneous edema of the R breast and adjacent chest wall. She was also noted to have significant R breast edema. During this time, a decision was made to transfer to [facility] but due to a prolonged bed wait, patient was not transferred. Apparently, [facility] was told to look for other hospitals w/ Vascular Services and [facility] was contacted. Dr. Kallie Edward agreed to evaluate the  patient and she was transferred here. She reports she has had ESRD since 2010 2/2 to uncontrolled hypertension. She had a fistula placed in [place] and required a declot procedure there. She moved back to [facility] in 2016 and her fistula was working up until 1 year ago. She had a permcath placed 1 year ago with no future plans for another AVF. Currently, she reports the swelling in her R arm has improved but not resolved. She is able to flex/extend her wrist which she could not do before. She complains of discomfort due to swelling of her R face, R breast and flank. She denies any n/v, f/c, cp or abd pain. She denies any difficulty with swallowing.       PAST MEDICAL:    Past Medical History:   Diagnosis Date    Asthma     Congestive heart failure (CMS HCC)     COPD (chronic obstructive pulmonary disease) (CMS HCC)     Esophageal reflux     ESRD (end stage renal disease) (CMS HCC)     HTN (hypertension)     Sleep apnea         Past Surgical History was reviewed and is negative for CABG         Medications Prior to Admission     Prescriptions    albuterol sulfate (PROVENTIL OR VENTOLIN OR PROAIR) 90 mcg/actuation Inhalation oral inhaler    Take 1-2 Puffs by inhalation Every 6 hours as needed    aspirin (ECOTRIN) 81 mg Oral Tablet, Delayed Release (E.C.)  Take 1 Tablet (81 mg total) by mouth Once a day    carvediloL (COREG) 12.5 mg Oral Tablet    Take 1 Tablet (12.5 mg total) by mouth in the morning and 1 Tablet (12.5 mg total) before bedtime.    cinacalcet (SENSIPAR) 30 mg Oral Tablet    Take 1 Tablet (30 mg total) by mouth Once a day    cloNIDine HCL (CATAPRES) 0.1 mg Oral Tablet    Take 1 Tablet (0.1 mg total) by mouth in the morning and 1 Tablet (0.1 mg total) at noon and 1 Tablet (0.1 mg total) before bedtime.    dilTIAZem HCL (CARDIZEM) 120 mg Oral Tablet    Take 1 Tablet (120 mg total) by mouth in the morning and 1 Tablet (120 mg total) at noon and 1 Tablet (120 mg total) before bedtime.    doxazosin  (CARDURA) 4 mg Oral Tablet    Take 1 Tablet (4 mg total) by mouth Every evening    furosemide (LASIX) 80 mg Oral Tablet    Take 1 Tablet (80 mg total) by mouth in the morning and 1 Tablet (80 mg total) in the evening.    hydrALAZINE (APRESOLINE) 100 mg Oral Tablet    Take 1 Tablet (100 mg total) by mouth in the morning and 1 Tablet (100 mg total) at noon and 1 Tablet (100 mg total) before bedtime.    hydrOXYzine pamoate (VISTARIL) 25 mg Oral Capsule    Take 1 Capsule (25 mg total) by mouth Every night as needed for Itching    isosorbide mononitrate (IMDUR) 60 mg Oral Tablet Sustained Release 24 hr    Take 1 Tablet (60 mg total) by mouth Every morning    labetaloL (NORMODYNE) 200 mg Oral Tablet    Take 1 Tablet (200 mg total) by mouth in the morning and 1 Tablet (200 mg total) at noon and 1 Tablet (200 mg total) before bedtime. 1.5 tabs.    loratadine (CLARITIN) 10 mg Oral Tablet    Take 1 Tablet (10 mg total) by mouth Once a day    losartan (COZAAR) 50 mg Oral Tablet    Take 1 Tablet (50 mg total) by mouth Once a day    montelukast (SINGULAIR) 10 mg Oral Tablet    Take 1 Tablet (10 mg total) by mouth Every evening    pantoprazole (PROTONIX) 40 mg Oral Tablet, Delayed Release (E.C.)    Take 1 Tablet (40 mg total) by mouth Once a day    spironolactone (ALDACTONE) 25 mg Oral Tablet    Take 1 Tablet (25 mg total) by mouth Once a day        Allergies   Allergen Reactions    Lisinopril Swelling         Family History  Heart disease  Diabetes     Social History  Social History     Tobacco Use    Smoking status: Every Day     Types: Cigarettes    Smokeless tobacco: Never   Substance Use Topics    Alcohol use: Not Currently           ROS:   Review of Systems   Constitutional: Positive for malaise/fatigue. Negative for chills, fever and weight loss.   HENT: Negative for ear pain, hearing loss and tinnitus.         Facial swelling  Neck swelling   Eyes: Negative for blurred vision, double vision and photophobia.    Respiratory: Negative for cough,  hemoptysis, sputum production and shortness of breath.    Cardiovascular: Negative for chest pain, palpitations, orthopnea and claudication.   Gastrointestinal: Negative for abdominal pain, heartburn, nausea and vomiting.   Genitourinary: Negative for dysuria, frequency and urgency.   Musculoskeletal: Positive for back pain and neck pain.   Skin: Negative for rash.   Neurological: Positive for weakness. Negative for dizziness, tingling and headaches.   Psychiatric/Behavioral: Negative for depression, substance abuse and suicidal ideas.      All other systems reviewed and negative other than history of present illness.    Examination:  Temperature: 36.6 C (97.9 F) Heart Rate: 75 BP (Non-Invasive): (!) 162/95   Respiratory Rate: 19 SpO2: 100 %       Physical Exam  Constitutional:       Appearance: Normal appearance. She is ill-appearing.   HENT:      Head: Atraumatic.      Comments: R facial swelling     Right Ear: External ear normal.      Left Ear: External ear normal.      Mouth/Throat:      Mouth: Mucous membranes are dry.   Eyes:      Extraocular Movements: Extraocular movements intact.      Pupils: Pupils are equal, round, and reactive to light.   Neck:      Comments: R neck swelling  Cardiovascular:      Rate and Rhythm: Normal rate and regular rhythm.      Heart sounds: No murmur heard.     Comments: Horizontal scar along her chest wall  Pulmonary:      Effort: Pulmonary effort is normal. No respiratory distress.      Breath sounds: Normal breath sounds. No wheezing.   Abdominal:      General: Abdomen is flat. Bowel sounds are normal.      Palpations: Abdomen is soft.      Comments: R flank swelling   Musculoskeletal:         General: Swelling (R arm) present.      Comments: R breast swelling   Skin:     Comments: Swelling along R neck, breast and flank   Neurological:      General: No focal deficit present.      Mental Status: She is alert and oriented to person, place,  and time.   Psychiatric:         Mood and Affect: Mood normal.         Behavior: Behavior normal.           Labs:    CBC (Last 24 Hours):  No results for input(s): WBC, HGB, HCT, MCV, PLTCNT in the last 24 hours.      Basic Metabolic Profile    No results found for: SODIUM, POTASSIUM, CHLORIDE, CO2, ANIONGAP No results found for: BUN, CREATININE, GLUCOSEFAST, GLUCOSENF         Hepatic Function    No results found for: ALBUMIN, TOTALPROTEIN, ALKPHOS, GAMMAGT, PROTHROMTME, INR No results found for: AST, ALT, BILIRUBINCON                 Imaging Studies:   XR AP MOBILE CHEST    (Results Pending)         DNR Status:  Full Code    Assessment/Plan:   Active Hospital Problems    Diagnosis    Primary Problem: SVC syndrome    ESRD (end stage renal disease) (CMS HCC)    Uncontrolled hypertension  Tobacco dependence    GERD (gastroesophageal reflux disease)    Insomnia     Possible SVC Syndrome  - Outside CTA w/ concern for moderate narrowing of the distal R subclavian vein at its junction  - Has significant R sided swelling  - Vascular Surgery consult  - Obtain admission labs  - May need repeat imaging here    Resistant hypertension  - Reports difficult to control hypertension and on multiple anti-hypertensive including clonidine PO and patch, coreg, labetalol, Cardizem, Cardura, lasix, Imdur, losartan and aldactone. Patient has all her bottles in the room with her.   - Can continue home meds for now and adjust as needed. Will appreciate nephro assistance in adjusting  - Monitor BP  - Telemetry    ESRD  - Diagnosed in 2010  - permcath placed 2021  - Nephro consult for HD  - Continue cinacalcet    GERD  - Continue PPI    Tobacco dependence  - Nicotine patch    Asthma/COPD  - Not in exacerbation  - nebs prn  - Continue Singulair     DVT/PE Prophylaxis: Heparin  Disposition Planning: pending    Sharl Ma, MD

## 2020-12-04 NOTE — Care Plan (Signed)
Patient is alert and oriented x 4 and currently on RA, Patient reports no pain. Patient has swelling to the right breast, up to the back and right side of the back of her neck, Patient has dialysis MWF with right perm cath, left limb precautions. Patient can ambulate independently. Continue to monitor.   Rockie Neighbours, RN  12/04/2020, 19:25  Problem: Adult Inpatient Plan of Care  Goal: Plan of Care Review  Outcome: Ongoing (see interventions/notes)  Goal: Patient-Specific Goal (Individualized)  Outcome: Ongoing (see interventions/notes)  Flowsheets (Taken 12/04/2020 1800)  Individualized Care Needs: TO be kept updated, up independently, on RA, MWF dialysis  Anxieties, Fears or Concerns: None  Goal: Absence of Hospital-Acquired Illness or Injury  Outcome: Ongoing (see interventions/notes)  Intervention: Identify and Manage Fall Risk  Flowsheets  Taken 12/04/2020 1922  Safety Promotion/Fall Prevention:   activity supervised   safety round/check completed   nonskid shoes/slippers when out of bed  Taken 12/04/2020 1800  Safety Promotion/Fall Prevention:   activity supervised   nonskid shoes/slippers when out of bed   safety round/check completed  Intervention: Prevent Skin Injury  Flowsheets  Taken 12/04/2020 1922  Body Position: positioned/repositioned independently  Skin Protection: adhesive use limited  Taken 12/04/2020 1800  Body Position: sitting  Skin Protection: adhesive use limited  Intervention: Prevent and Manage VTE (Venous Thromboembolism) Risk  Flowsheets  Taken 12/04/2020 1922  VTE Prevention/Management:   ambulation promoted   dorsiflexion/plantar flexion performed  Taken 12/04/2020 1800  VTE Prevention/Management:   ambulation promoted   dorsiflexion/plantar flexion performed  Intervention: Prevent Infection  Flowsheets  Taken 12/04/2020 1922  Infection Prevention:   promote handwashing   rest/sleep promoted  Taken 12/04/2020 1800  Infection Prevention:   rest/sleep promoted   promote handwashing  Goal:  Optimal Comfort and Wellbeing  Outcome: Ongoing (see interventions/notes)  Intervention: Provide Person-Centered Care  Recent Flowsheet Documentation  Taken 12/04/2020 1800 by Rockie Neighbours, [place] Relationship/Rapport: care explained  Goal: Rounds/Family Conference  Outcome: Ongoing (see interventions/notes)     Problem: Fall Injury Risk  Goal: Absence of Fall and Fall-Related Injury  Outcome: Ongoing (see interventions/notes)  Intervention: Identify and Manage Contributors  Recent Flowsheet Documentation  Taken 12/04/2020 1800 by Rockie Neighbours, RN  Self-Care Promotion:   BADL personal objects within reach   independence encouraged  Intervention: Promote [place] Documentation  Taken 12/04/2020 1922 by Rockie Neighbours, RN  Safety Promotion/Fall Prevention:   activity supervised   safety round/check completed   nonskid shoes/slippers when out of bed  Taken 12/04/2020 1800 by Rockie Neighbours, RN  Safety Promotion/Fall Prevention:   activity supervised   nonskid shoes/slippers when out of bed   safety round/check completed

## 2020-12-04 NOTE — Consults (Signed)
New consult. ESRD. Concern for SVC syndrome. Full note to follow.    Nita Sells, [place]  Department of Nephrology

## 2020-12-04 NOTE — Care Plan (Signed)
Problem: Adult Inpatient Plan of Care  Goal: Plan of Care Review  Outcome: Ongoing (see interventions/notes)  Goal: Patient-Specific Goal (Individualized)  Outcome: Ongoing (see interventions/notes)  Goal: Absence of Hospital-Acquired Illness or Injury  Outcome: Ongoing (see interventions/notes)  Intervention: Identify and Manage Fall Risk  Recent Flowsheet Documentation  Taken 12/04/2020 2120 by Trinna Post, RN  Safety Promotion/Fall Prevention:   safety round/check completed   nonskid shoes/slippers when out of bed  Intervention: Prevent Skin Injury  Recent Flowsheet Documentation  Taken 12/04/2020 2120 by Trinna Post, RN  Body Position: supine, head elevated  Skin Protection: adhesive use limited  Intervention: Prevent and Manage VTE (Venous Thromboembolism) Risk  Recent Flowsheet Documentation  Taken 12/04/2020 2120 by Trinna Post, RN  VTE Prevention/Management:   ambulation promoted   dorsiflexion/plantar flexion performed  Intervention: Prevent Infection  Recent Flowsheet Documentation  Taken 12/04/2020 2120 by Trinna Post, RN  Infection Prevention: rest/sleep promoted  Goal: Optimal Comfort and Wellbeing  Outcome: Ongoing (see interventions/notes)  Intervention: Provide [place] Documentation  Taken 12/04/2020 2120 by Trinna Post, RN  Trust Relationship/Rapport:   care explained   thoughts/feelings acknowledged   questions encouraged  Goal: Rounds/Family Conference  Outcome: Ongoing (see interventions/notes)     Problem: Fall Injury Risk  Goal: Absence of Fall and Fall-Related Injury  Outcome: Ongoing (see interventions/notes)  Intervention: Identify and Manage Contributors  Recent Flowsheet Documentation  Taken 12/04/2020 2120 by Trinna Post, RN  Self-Care Promotion:   independence encouraged   BADL personal objects within reach  Intervention: Promote [place] Documentation  Taken 12/04/2020 2120 by Trinna Post, RN  Safety Promotion/Fall  Prevention:   safety round/check completed   nonskid shoes/slippers when out of bed

## 2020-12-04 NOTE — Care Plan (Signed)
Problem: Adult Inpatient Plan of Care  Goal: Plan of Care Review  Outcome: Ongoing (see interventions/notes)  Goal: Patient-Specific Goal (Individualized)  Outcome: Ongoing (see interventions/notes)  Flowsheets (Taken 12/04/2020 1637)  Individualized Care Needs: keep me updated  Anxieties, Fears or Concerns: none  Goal: Absence of Hospital-Acquired Illness or Injury  Outcome: Ongoing (see interventions/notes)  Goal: Optimal Comfort and Wellbeing  Outcome: Ongoing (see interventions/notes)  Goal: Rounds/Family Conference  Outcome: Ongoing (see interventions/notes)

## 2020-12-05 ENCOUNTER — Encounter (HOSPITAL_COMMUNITY): Payer: Self-pay | Admitting: Hospitalist

## 2020-12-05 ENCOUNTER — Encounter (HOSPITAL_COMMUNITY)
Admission: AD | Disposition: A | Discharge status: Home / Self Care | Payer: Self-pay | Source: Other Acute Inpatient Hospital | Attending: Internal Medicine

## 2020-12-05 ENCOUNTER — Inpatient Hospital Stay (HOSPITAL_COMMUNITY): Payer: Commercial Managed Care - PPO | Admitting: Certified Registered"

## 2020-12-05 DIAGNOSIS — Z853 Personal history of malignant neoplasm of breast: Secondary | ICD-10-CM

## 2020-12-05 DIAGNOSIS — E785 Hyperlipidemia, unspecified: Secondary | ICD-10-CM

## 2020-12-05 DIAGNOSIS — R2689 Other abnormalities of gait and mobility: Secondary | ICD-10-CM

## 2020-12-05 DIAGNOSIS — I82210 Acute embolism and thrombosis of superior vena cava: Secondary | ICD-10-CM

## 2020-12-05 DIAGNOSIS — R7989 Other specified abnormal findings of blood chemistry: Secondary | ICD-10-CM

## 2020-12-05 DIAGNOSIS — Z992 Dependence on renal dialysis: Secondary | ICD-10-CM

## 2020-12-05 DIAGNOSIS — N186 End stage renal disease: Secondary | ICD-10-CM

## 2020-12-05 DIAGNOSIS — I132 Hypertensive heart and chronic kidney disease with heart failure and with stage 5 chronic kidney disease, or end stage renal disease: Secondary | ICD-10-CM

## 2020-12-05 DIAGNOSIS — I509 Heart failure, unspecified: Secondary | ICD-10-CM

## 2020-12-05 DIAGNOSIS — E878 Other disorders of electrolyte and fluid balance, not elsewhere classified: Secondary | ICD-10-CM

## 2020-12-05 LAB — CBC WITH DIFF
BASOPHIL #: <0.1 10*3/uL (ref <=0.20)
BASOPHIL %: 0 %
EOSINOPHIL #: <0.1 10*3/uL (ref <=0.50)
EOSINOPHIL %: 0 %
HCT: 31.6 % — ABNORMAL LOW (ref 34.8–46.0)
HGB: 10.1 g/dL — ABNORMAL LOW (ref 11.5–16.0)
IMMATURE GRANULOCYTE #: 0.14 10*3/uL — ABNORMAL HIGH (ref <0.10)
IMMATURE GRANULOCYTE %: 1 % (ref 0–1)
LYMPHOCYTE #: 1.17 10*3/uL (ref 1.00–4.80)
LYMPHOCYTE %: 8 %
MCH: 29.4 pg (ref 26.0–32.0)
MCHC: 32 g/dL (ref 31.0–35.5)
MCV: 91.9 fL (ref 78.0–100.0)
MONOCYTE #: 1.1 10*3/uL (ref 0.20–1.10)
MONOCYTE %: 8 %
MPV: 8.7 fL (ref 8.7–12.5)
NEUTROPHIL #: 12.1 10*3/uL — ABNORMAL HIGH (ref 1.50–7.70)
NEUTROPHIL %: 83 %
PLATELETS: 316 10*3/uL (ref 150–400)
RBC: 3.44 10*6/uL — ABNORMAL LOW (ref 3.85–5.22)
RDW-CV: 17.3 % — ABNORMAL HIGH (ref 11.5–15.5)
WBC: 14.6 10*3/uL — ABNORMAL HIGH (ref 3.7–11.0)

## 2020-12-05 LAB — PT/INR: INR: 0.88 (ref 0.80–1.10)

## 2020-12-05 LAB — BASIC METABOLIC PANEL
ANION GAP: 16 mmol/L
BUN/CREA RATIO: 8
BUN: 97 mg/dL — ABNORMAL HIGH (ref 10–25)
CALCIUM: 8.9 mg/dL (ref 8.8–10.3)
CHLORIDE: 89 mmol/L — ABNORMAL LOW (ref 98–111)
CO2 TOTAL: 23 mmol/L (ref 21–35)
CREATININE: 11.53 mg/dL — ABNORMAL HIGH (ref <=1.30)
ESTIMATED GFR: 4 mL/min/{1.73_m2} — ABNORMAL LOW
GLUCOSE: 98 mg/dL (ref 70–110)
POTASSIUM: 4.5 mmol/L (ref 3.5–5.0)
SODIUM: 128 mmol/L — ABNORMAL LOW (ref 135–145)

## 2020-12-05 LAB — PARATHYROID HORMONE (PTH): PTH: 748.1 pg/mL — ABNORMAL HIGH (ref 12.0–88.0)

## 2020-12-05 LAB — ALBUMIN: ALBUMIN: 3.8 g/dL (ref 3.2–4.8)

## 2020-12-05 LAB — POC BLOOD GLUCOSE (RESULTS): GLUCOSE, POC: 111 mg/dl — ABNORMAL HIGH (ref 70–110)

## 2020-12-05 LAB — PHOSPHORUS: PHOSPHORUS: 6.5 mg/dL — ABNORMAL HIGH (ref 2.5–4.5)

## 2020-12-05 LAB — MAGNESIUM: MAGNESIUM: 2.1 mg/dL (ref 1.8–2.3)

## 2020-12-05 SURGERY — VENOGRAM UPPER EXTREMITY - IMAGING ONLY
Anesthesia: General | Site: Arm | Laterality: Right | Wound class: N/A - Imaging Only

## 2020-12-05 MED ORDER — PROPOFOL 10 MG/ML IV BOLUS
INJECTION | Freq: Once | INTRAVENOUS | Status: DC | PRN
Start: 2020-12-05 — End: 2020-12-05
  Administered 2020-12-05: 30 mg via INTRAVENOUS

## 2020-12-05 MED ORDER — PROCHLORPERAZINE EDISYLATE 10 MG/2 ML (5 MG/ML) INJECTION SOLUTION
5.0000 mg | Freq: Once | INTRAMUSCULAR | Status: DC | PRN
Start: 2020-12-05 — End: 2020-12-05

## 2020-12-05 MED ORDER — MIDAZOLAM 1 MG/ML INJECTION WRAPPER
INTRAMUSCULAR | Status: AC
Start: 2020-12-05 — End: 2020-12-05
  Filled 2020-12-05: qty 2

## 2020-12-05 MED ORDER — SODIUM CHLORIDE 0.9 % INTRAVENOUS SOLUTION
Freq: Once | INTRAVENOUS | Status: DC | PRN
Start: 2020-12-05 — End: 2020-12-05

## 2020-12-05 MED ORDER — FENTANYL (PF) 50 MCG/ML INJECTION SOLUTION
Freq: Once | INTRAMUSCULAR | Status: DC | PRN
Start: 2020-12-05 — End: 2020-12-05
  Administered 2020-12-05: 50 ug via INTRAVENOUS

## 2020-12-05 MED ORDER — HYDROMORPHONE (PF) 0.5 MG/0.5 ML INJECTION SYRINGE
0.5000 mg | INJECTION | INTRAMUSCULAR | Status: DC | PRN
Start: 2020-12-05 — End: 2020-12-05
  Administered 2020-12-05 (×2): 0.5 mg via INTRAVENOUS
  Filled 2020-12-05 (×2): qty 0.5

## 2020-12-05 MED ORDER — ONDANSETRON HCL (PF) 4 MG/2 ML INJECTION SOLUTION
4.0000 mg | Freq: Once | INTRAMUSCULAR | Status: DC | PRN
Start: 2020-12-05 — End: 2020-12-05
  Administered 2020-12-05: 18:00:00 4 mg via INTRAVENOUS
  Filled 2020-12-05: qty 2

## 2020-12-05 MED ORDER — MEPERIDINE (PF) 50 MG/ML INJECTION SOLUTION
12.5000 mg | INTRAMUSCULAR | Status: DC | PRN
Start: 2020-12-05 — End: 2020-12-05

## 2020-12-05 MED ORDER — SODIUM CHLORIDE 0.9 % INTRAVENOUS SOLUTION
INTRAVENOUS | Status: DC
Start: 2020-12-05 — End: 2020-12-06

## 2020-12-05 MED ORDER — HYDRALAZINE 20 MG/ML INJECTION SOLUTION
5.0000 mg | Freq: Once | INTRAMUSCULAR | Status: DC | PRN
Start: 2020-12-05 — End: 2020-12-05

## 2020-12-05 MED ORDER — FENTANYL (PF) 50 MCG/ML INJECTION SOLUTION
25.0000 ug | INTRAMUSCULAR | Status: DC | PRN
Start: 2020-12-05 — End: 2020-12-05

## 2020-12-05 MED ORDER — LACTATED RINGERS INTRAVENOUS SOLUTION
INTRAVENOUS | Status: DC
Start: 2020-12-05 — End: 2020-12-05

## 2020-12-05 MED ORDER — PROPOFOL 10 MG/ML IV BOLUS
INJECTION | INTRAVENOUS | Status: AC
Start: 2020-12-05 — End: 2020-12-05
  Filled 2020-12-05: qty 50

## 2020-12-05 MED ORDER — KETAMINE 50 MG/5 ML (10 MG/ML) IN 0.9 % SODIUM CHLORIDE IV SYRINGE
INJECTION | INTRAVENOUS | Status: AC
Start: 2020-12-05 — End: 2020-12-05
  Filled 2020-12-05: qty 5

## 2020-12-05 MED ORDER — FENTANYL (PF) 50 MCG/ML INJECTION SOLUTION
INTRAMUSCULAR | Status: AC
Start: 2020-12-05 — End: 2020-12-05
  Filled 2020-12-05: qty 2

## 2020-12-05 MED ORDER — LABETALOL 5 MG/ML INTRAVENOUS SOLUTION
5.0000 mg | INTRAVENOUS | Status: DC | PRN
Start: 2020-12-05 — End: 2020-12-05

## 2020-12-05 MED ORDER — PROPOFOL 10 MG/ML IV BOLUS
INJECTION | INTRAVENOUS | Status: AC
Start: 2020-12-05 — End: 2020-12-05
  Filled 2020-12-05: qty 100

## 2020-12-05 MED ORDER — SEVELAMER CARBONATE 800 MG TABLET
800.0000 mg | ORAL_TABLET | Freq: Three times a day (TID) | ORAL | Status: DC
Start: 2020-12-05 — End: 2020-12-06
  Administered 2020-12-05 – 2020-12-06 (×2): 0 mg via ORAL
  Filled 2020-12-05 (×2): qty 1

## 2020-12-05 MED ORDER — PROPOFOL 10 MG/ML IV - CHI
INTRAVENOUS | Status: DC | PRN
Start: 2020-12-05 — End: 2020-12-05
  Administered 2020-12-05: 75 ug/kg/min via INTRAVENOUS
  Administered 2020-12-05: 0 ug/kg/min via INTRAVENOUS

## 2020-12-05 SURGICAL SUPPLY — 26 items
CATH ANGIO 5FR BERN CURVE 65CM TMP RADOPQ BRD SLCT CRBRL SLX STRL LF  ACPT .038IN GW (CATHETERS) ×2 IMPLANT
CATH ANGIO 5FR BERN CURVE 65CM_CORDIS TMP RADOPQ BRD SLCT (CATHETERS) ×1
CATH MUSTANG 10MM 80MM 75CM HI PRESS BAL DIL NYBAX ACPT .035IN GW 6FR INTROD SHEATH (BALLOON) ×2 IMPLANT
CATH MUSTANG 10MM 80MM 75CM HI_PRESS BAL DIL NYBAX ACPT .035 (BALLOON) ×2
CATH MUSTANG 12MM 80MM 75CM HI PRESS BAL DIL NYBAX ACPT .035IN GW 7FR INTROD SHEATH (BALLOON) ×2 IMPLANT
CATH MUSTANG 12MM 80MM 75CM HI_PRESS BAL DIL NYBAX ACPT .035 (BALLOON) ×1
CATH XXL 14MM 40MM 75CM SCRCH RST HI STRTH SHAFT BAL DIL PERI LRG VESSEL ACPT .035IN GW 7FR SHEATH (BALLOON) ×2 IMPLANT
CATH XXL 14MM 40MM 75CM SCRCH_RST HI STRTH SHFT BAL DIL PERI (BALLOON) ×1
CONV USE ITEM 338637 - PACK SURG UNIV CV STRL DISP LF (CUSTOM TRAYS & PACK) ×2
DEVICE INFLAT ENCR 26 12FR 3IN_PRESS GA 20ML LF (INSTRUMENTS ENDOMECHANICAL) ×1
DEVICE INFLATION ENCORE 26 3IN 12FR 20ML PRES GA LF (ENDOSCOPIC SUPPLIES) ×2 IMPLANT
DISC NO SUB - CONTRAST ISOVUE 300 10X50ML_131530 10/BX (CONTRAST) ×6 IMPLANT
GW GLDWR .035IN 3CM 260CM FLXB_COR TO TIP RADOPQ STF SHFT (WIRE) ×2
GW GLDWR .035IN 3CM 260CM RADOPQ STF FLXB JKT NITINOL TUNG POLYUR HDRPH VAS ANG (WIRE) ×2 IMPLANT
GW START .035IN 15CM 260CM FLX_SPRG COIL TIP STD SHFT PTFE (WIRE) ×1
GW START BNTSN .035IN 15CM 260CM PTFE VAS PERI STR TAPER STD (WIRE) ×2 IMPLANT
KIT ANGIO CNV DISP (CUSTOM TRAYS & PACK) ×2 IMPLANT
KIT ANGIO CUSTOM NAVILYST_BX/10 (CUSTOM TRAYS & PACK) ×1
KIT MICROINTRO MNSTCK 7CM MAX 4FR 21GA TUNG NITINOL COAX STIFFEN GW ECHGN NEEDLE STRL DISP (VASCULAR) ×2 IMPLANT
KIT MICROINTRO MNSTCK 7CM MAX_4FR 21GA TUNG NITINOL COAX (VASCULAR) ×1
PACK CARDIOVASCULAR CUST UHC_DYNJ64219A (CUSTOM TRAYS & PACK) ×1
PACK UNIVERSAL CARDIOVASCULAR  - ~~LOC~~ (CUSTOM TRAYS & PACK) ×2 IMPLANT
SET INTROD 9 3/8IN 8FR SUP ARROWFLX PERC 3W STOPCOCK TISS DIL OBTURATOR INT HMSTS VALVE ADULT ACPT (VASCULAR) ×2 IMPLANT
SET INTROD 9 3/8IN 8FR SUP ARR_OWFLX PERC SHTH RADOPQ INT (VASCULAR) ×1
SHEATH INTROD 10CM 5FR PINN .035IN PERI SS SNAPON DIL LOCK KINK RST SMOOTH TRNS SPRG COIL GW 2.5CM (INTRODUCER) ×2 IMPLANT
SHEATH INTROD PINNACLE 5FRX10_10/BX RSS501 (INTRODUCER) ×1

## 2020-12-05 NOTE — OR Surgeon (Signed)
OPERATIVE NOTE    Patient Name: [name]  Age:  45 y.o.  Sex:  female  MRN:  [id number]  CSN:  [id number]    Date of Service: 12/05/2020    Date of Birth: 1975-04-09      Pre-Operative Diagnosis:  End-stage renal disease.  Indwelling right internal jugular vein tunneled hemodialysis catheter.  Innominate vein occlusion with SVC syndrome    Post-Operative Diagnosis:  same    Procedure:  Right upper extremity venogram   right subclavian vein innominate vein and superior vena cava venogram   balloon angioplasty right innominate vein   ultrasound-guided right brachial vein access    Indication:  History of indwelling right internal jugular vein tunneled hemodialysis catheter.  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  Appropriate consents were obtained.  Patient was brought to the operating room. Patient was positioned  supine on the operating table Appropriate lines and tubes were placed.  mac anesthesia was initiated by the anesthesiologist.  The right upper extremity was prepped and draped in sterile fashion standard for the procedure.      Using a modified Seldinger technique a 8 French sheath was placed in the right brachial vein just above the antecubital fossa.  PAtient was heparinized.  Contrast was injected to do a right upper extremity venogram with further contrast injection to evaluate the outflow veins into the superior vena cava    Findings:  Patent brachial veins.  Patent axillary and subclavian vein.  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  Indwelling right IJ catheter that extends into the superior vena cava     With the aid of a  Guide catheter,  a  Stiff straight Glidewire was placed through the veins subclavian vein and innominate vein occlusion into the superior vena cava and advanced into the inferior vena cava for support.  We I then used 10 mm, 12 mm, Mustang balloon to do balloon angioplasty of the innominate vein and further balloon angioplasty with a 40 mm X XL balloon.      Completion angiogram now showed good flow through the vein without any significant residual stenosis.  There was no compromise of the indwelling catheter.      AT this stage I removed my catheters wires and sheath and secured hemostasis with manual compression.      Patient tolerated the procedure well.  There were no complications.  Blood loss was minimal    Attending Surgeon: Sheppard Plumber, MD FACS    Assistant(s):None    Anesthesia Type: Anesthesia     Estimated Blood Loss:  minimal    Blood Given: None    Fluids Given: Per Anesthesia Records     Complications (not routinely expected or not inherent to difficulty/nature of procedure): None    Characteristic Event (routinely expected or inherent to the difficulty/nature of the procedure): None    Did the use of current and/or prior Anticoagulants impact the outcome of the case?no    Wound Class: Clean Wound: Uninfected operative wounds in which no inflammation occurred    Tubes: None    Drains: None    Specimens/ Cultures: None    Implants: None  Disposition: PACU - hemodynamically stable.    Condition: stable    Sheppard Plumber, MD FACS10/12/202220:51

## 2020-12-05 NOTE — Anesthesia Preprocedure Evaluation (Signed)
ANESTHESIA PRE-OP EVALUATION  Planned Procedure: VENOGRAM UPPER EXTREMITY - IMAGING ONLY (Right)  Review of Systems         patient summary reviewed  nursing notes reviewed        Pulmonary   COPD, asthma and sleep apnea,   Cardiovascular    Hypertension and CHF ,No peripheral edema,        GI/Hepatic/Renal    GERD and end-stage renal disease    dialysis treatment      Endo/Other         Neuro/Psych/MS        Cancer                      Physical Assessment      Airway       Mallampati: I      Mouth Opening: good.            Dental                    Pulmonary           Cardiovascular    Rhythm: regular  Rate: Normal  (-) no friction rub, carotid bruit is not present, no peripheral edema and no murmur     Other findings            Plan  ASA 4     Planned anesthesia type: general     total intravenous anesthesia

## 2020-12-05 NOTE — Anesthesia Postprocedure Evaluation (Signed)
Anesthesia Post Op Evaluation    Patient: [name]  Procedure(s):  VENOGRAM UPPER EXTREMITY - IMAGING ONLY  SURG VASC US GUIDED ACCESS  ANGIOPLASTY SVC - IMAGING ONLY    Last Vitals:Temperature: 36.5 C (97.7 F) (12/05/20 2055)  Heart Rate: 64 (12/05/20 2055)  BP (Non-Invasive): (!) 169/104 (12/05/20 2055)  Respiratory Rate: 20 (12/05/20 2055)  SpO2: 100 % (12/05/20 2055)    No notable events documented.    Patient is sufficiently recovered from the effects of anesthesia to participate in the evaluation and has returned to their pre-procedure level.  Patient location during evaluation: PACU       Patient participation: complete - patient participated  Level of consciousness: awake and alert and responsive to verbal stimuli    Pain management: adequate  Airway patency: patent    Anesthetic complications: no  Cardiovascular status: acceptable  Respiratory status: acceptable, spontaneous ventilation and nasal cannula  Hydration status: acceptable  Patient post-procedure temperature: Pt Normothermic   PONV Status: Absent

## 2020-12-05 NOTE — Consults (Signed)
[place]    Date of Service: 12/05/2020  [name]  Date of Admission: 12/04/2020  Consult requested by Hospitalist 6, Dr. Dossie Der    Reason for consult:   ESRD    Assessment:  Patient is a 45 YOF with a PMH of CHF, HTN, HLD, COPD and ESRD on HD who presents with concern for SVC syndrome. Nephrology consulted for ESRD Management.      ESRD on HD via R IJ TDC   Normocytic Anemia  Leukocytosis  Moderate Asymptomatic Hyponatremia  Azotemia  Hyperphosphatemia   Electrolytes: Na 128, K 4.5, Cl 89   Acid/base imbalance: Bicarb 23, AG 16, no BG for complete assessment   BMD: Ca 8.9 (uncorrected), Phos 6.5, Mg 2.1, iPTH: pending, Vit D 25: pending   Anemia: Hgb 10.1          Recommendations:  - iHD today:   - 3 HR, 350 BFR, 800 DFR, 2K/2.5 Ca bath, 1-1.5 L UF as hemodynamics tolerate  - renal BMD labs ordered for accurate evaluation and treatment while admitted  - Renvela 800 mg TID w/ meals (ordered)   - leukocytosis present: defer infectious work up to primary service  - Strict I&O's with daily standing weights  - BMP, Mg, Phos in AM (ordered)  - Nephrology will continue to follow, please page with any inquiries           HPI:  45 y.o. F with a PMH of CHF, HTN, HLD, COPD and ESRD on HD who presents with concern for SVC syndrome. Patient reports that she has been having issues with swelling in the RUE and also having issues with swelling in her face. Patient reports being on dialysis since 2010. Patient reports a long standing history of uncontrolled HTN. Patient denies knowledge of pheochromocytoma or reninoma or hyperaldosteronism. Patient reports 2 prior failed AVF's. Patient states she gets HD through the [facility] now. Patient otherwise denies any other complaints at this time.     Past Medical History:   Diagnosis Date   . Asthma    . Congestive heart failure (CMS HCC)    . COPD (chronic obstructive pulmonary disease) (CMS HCC)    . Esophageal reflux    . ESRD (end stage  renal disease) (CMS [place])    . HTN (hypertension)    . Sleep apnea      Past Sx Hx: RUE AVF    Social/Family History/ROS: As per HPI by primary team     I have reviewed the current medications     Objective:  Filed Vitals:    12/05/20 0425 12/05/20 0500 12/05/20 1418 12/05/20 1430   BP:  (!) 156/86 (!) 164/97 (!) 157/78   Pulse:   69 74   Resp: 16  16    Temp:   36.3 C (97.3 F) 36.8 C (98.2 F)   SpO2:   100% 94%   General: laying supine in bed, vital signs reviewed   Eyes: Conjunctiva clear, Sclera non-icteric   HEENT: MMM, no thrush, no exudate  Neck: supple, symmetrical, trachea appears midline   Lungs: clear to auscultation b/l, no crackles, symmetrical chest wall rise   Cardiovascular: no rub, no gallop, 1+ b/l LE edema  Abdomen: soft, non tender, non distended  Extremities: No cyanosis, grossly intact   Skin: Skin warm and dry, no rash    Neurologic: No tremor, No asterixis   Access: R IJ TDC     IO  10/11 0700 -  10/12 0659  In: 240 [P.O.:240]  Out: -      Labs: I have reviewed the labs     Imaging: I have reviewed available imaging results  CXR on 12/04/20: no pulmonary vascular congestion per my read     [name], [place]  Department of Nephrology

## 2020-12-05 NOTE — Progress Notes (Signed)
[facility]  Hospitalist Progress Note    [name]       45 y.o.       Date of service: 12/05/2020  Date of Admission:  12/04/2020    Hospital Day:  LOS: 1 day   CC: SVC syndrome    Subjective: Patient seen and examined. Reports her R breast has enlarged since admission. Still w/ swelling to face and flank. Currently NPO for planned venogram.   Also discussed w/ Vascular surgery. Will consult Gen surg given history of Breast Ca.       Objective:   Filed Vitals:    12/04/20 2230 12/05/20 0300 12/05/20 0425 12/05/20 0500   BP:  (!) 187/113  (!) 156/86   Pulse:  79     Resp: '18 18 16    '$ Temp:  37 C (98.6 F)     SpO2:  100%       O2 delivery: None (Room Air)     I have reviewed the vitals.      Input/Output    Intake/Output Summary (Last 24 hours) at 12/05/2020 1253  Last data filed at 12/05/2020 0100  Gross per 24 hour   Intake 240 ml   Output --   Net 240 ml         acetaminophen (TYLENOL) tablet, 650 mg, Oral, Q6H PRN  aspirin (ECOTRIN) enteric coated tablet 81 mg, 81 mg, Oral, Daily  carvedilol (COREG) tablet, 12.5 mg, Oral, 2x/day  cinacalcet (SENSIPAR) tablet, 30 mg, Oral, Daily  cloNIDine (CATAPRES) tablet, 0.1 mg, Oral, 3x/day  dilTIAZem (CARDIZEM) tablet, 120 mg, Oral, 3x/day  doxazosin (CARDURA) tablet, 4 mg, Oral, QPM  furosemide (LASIX) tablet, 80 mg, Oral, 2x/day  heparin 10,000 units/mL injection, 5,000 Units, Subcutaneous, Q8HRS  hydrALAZINE (APRESOLINE) tablet, 100 mg, Oral, 3x/day  HYDROmorphone (DILAUDID) 0.5 mg/0.5 mL injection, 0.2 mg, Intravenous, Q4H PRN  hydrOXYzine pamoate (VISTARIL) capsule, 25 mg, Oral, HS PRN  ipratropium-albuterol 0.5 mg-3 mg(2.5 mg base)/3 mL Solution for Nebulization, 3 mL, Nebulization, Q6H PRN  isosorbide mononitrate (IMDUR) 24 hr extended release tablet, 60 mg, Oral, QAM  [Held by provider] labetalol (NORMODYNE) tablet, 200 mg, Oral, 3x/day  loratadine (CLARITIN) tablet, 10 mg, Oral, Daily  losartan (COZAAR) tablet, 50 mg, Oral, Daily  montelukast  (SINGULAIR) 10 mg tablet, 10 mg, Oral, QPM  nicotine (NICODERM CQ) transdermal patch (mg/24 hr), 14 mg, Transdermal, Daily  NS flush syringe, 3 mL, Intracatheter, Q8HRS  NS flush syringe, 3 mL, Intracatheter, Q1H PRN  ondansetron (ZOFRAN) 2 mg/mL injection, 4 mg, Intravenous, Q6H PRN  pantoprazole (PROTONIX) delayed release tablet, 40 mg, Oral, Daily  spironolactone (ALDACTONE) tablet, 25 mg, Oral, Daily        Physical Exam:  General: No acute distress, In bed  HEENT: R facial and neck swelling  Cardiac: RRR  Chest: R breast swelling. Permcath to R chest wall  Respiratory: CTA  Abdomen: Distended along R abdomen.   Extremities: R arm > L arm. 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     CBC (Last 24 Hours):    Recent Results last 24 hours     12/04/20  1922 12/05/20  0721   WBC 12.3* 14.6*   HGB 10.7* 10.1*   HCT 33.6* 31.6*   MCV 91.6 91.9   PLTCNT 352 316         BMP (Last 24 Hours):    Recent Results last 24 hours     12/04/20  1922 12/05/20  0721  SODIUM 123* 128*   POTASSIUM 4.5 4.5   CHLORIDE 95* 89*   CO2 24 23   BUN 90* 97*   CREATININE 10.86* 11.53*   CALCIUM 9.0 8.9   GLUCOSENF 160* 98           I have reviewed all labs.    Micro: No results found for any visits on 12/04/20 (from the past 96 hour(s)).    Radiology:    Results for orders placed or performed during the hospital encounter of 12/04/20 (from the past 24 hour(s))   XR AP MOBILE CHEST     Status: None    Narrative    Lurline Knoblock    PROCEDURE DESCRIPTION: XR AP MOBILE CHEST    CLINICAL INDICATION: admission    TECHNIQUE: 1 views / 2 images submitted.    COMPARISON: Outside hospital chest CTA 11/27/2020      FINDINGS: There is a right IJ central venous catheter. No pneumothorax. No focal infiltrate or obvious pulmonary vascular congestion. Heart size is normal.      Impression    No acute process identified.                Radiologist location ID: [id number]         PT/OT: No    Consults: Vascular Surgery, Nephrology, Gen Surgery    Assessment/  Plan:   Active Hospital Problems    Diagnosis   . Primary Problem: SVC syndrome   . ESRD (end stage renal disease) (CMS [place])   . Uncontrolled hypertension   . Tobacco dependence   . GERD (gastroesophageal reflux disease)   . Insomnia     Possible SVC Syndrome  - Outside CTA w/ concern for moderate narrowing of the distal R subclavian vein at its junction  - Has significant R sided swelling  - Vascular Surgery consult appreciated  - Plan for venogram w/ intervention today  - Gen Surg consult for breast swelling.     Resistant hypertension  - Reports difficult to control hypertension and on multiple anti-hypertensive including clonidine PO and patch, coreg, labetalol, Cardizem, Cardura, lasix, Imdur, losartan and aldactone. Patient has all her bottles in the room with her.   - Can continue home meds for now and adjust as needed. Will appreciate nephro assistance in adjusting  - Monitor BP  - Telemetry    ESRD  - Diagnosed in 2010  - permcath placed 2021  - Nephro consult for HD  - Continue cinacalcet    GERD  - Continue PPI    Tobacco dependence  - Nicotine patch    Asthma/COPD  - Not in exacerbation  - nebs prn  - Continue Singulair       DVT/PE Prophylaxis: Heparin    Disposition Planning: pending     Sharl Ma, MD

## 2020-12-05 NOTE — H&P (Signed)
[facility]  H&P Update Form    [name], [name], 45 y.o. female  Date of Admission:  12/04/2020  Date of Birth:  08-Mar-1975    12/05/2020    STOP: IF H&P IS GREATER THAN 30 DAYS FROM SURGICAL DAY COMPLETE NEW H&P IS REQUIRED.     H & P updated the day of the procedure.  1.  H&P completed within 30 days of surgical procedure and has been reviewed within 24 hours of the surgery, the patient has been examined, and no change has occured in the patients condition since the H&P was completed.       Change in medications: No      Comments:     2.  Patient continues to be appropiate candidate for planned surgical procedure. YES    3.  In addition to the risks associated with this particular procedure that have been explained to the patient as well as the benefit to the procedure, the patient also understands that at this time there is additional environmental risks associated with the presence of COVID -19 in our hospital. Patient consents to the procedure accepting this additional risk.    Sheppard Plumber, MD FACS

## 2020-12-05 NOTE — Nurses Notes (Signed)
Patient's nurse states that patient has been in angiogram since 2:30.  Spoke with Dr. Jeanette Caprice and we will delay dialysis until tomorrow.

## 2020-12-05 NOTE — Consults (Signed)
[facility]  Vascular Surgery Consult       [facility]  Initial Consult    Baxley, Tullo  Date of Admission:  12/04/2020  Date of Birth:  May 02, 1975  Date of Service:  12/05/2020    PCP:  Cam Hai, FNP  Primary Service: [facility] HOSPITALIST 6   Consult Requested By: Dr. Dossie Der    Chief Complaint: right face, breast swelling     ASSESSMENT & PLAN:  Assessment:  Right SVC syndrome   ? Right breast malignancy     Plan:  NPO  Discussed risk and benefits of upper extremity venogram with possible intervention, based on findings.  Patient is agreeable to proceed.  For OR today for upper extremity venogram possible intervention       DVT/PE Prophylaxis: Heparin  Disposition Planning: Home discharge      Subjective   Very concerned about her right breast swelling  Information Obtained from: patient and history reviewed via medical record  HPI:  Patient is a 45 y.o. female who was seen today for SVC syndrome.  Patient was admitted yesterday via transfer from [facility] due to concerns SVC syndrome with right-sided symptoms.  Patient has right facial swelling, right shoulder swelling, right breast swelling and right flank swelling.  Patient reports prior to going to the hospital she was having approximately 2 weeks of symptoms.  She states that they have been treating her for cellulitis.  She was admitted on October 4th at [facility] and she was treated with antibiotics and the nephrologist recommended that she undergo a CT scan of the chest for SVC syndrome.  She had a noncontrasted CT scan of the chest demonstrated extensive skin thickening in the right breast with abundant edema with subcutaneous that as well as to the right breast, morbidly worsening when compared to previous study this could potentially be an infectious etiology representing cellulitis.  Malignancy such as inflammatory breast cancer could result in similar appearance however this is felt to be less likely given  the change wall through study multiple enlarged right axillary nodes are considered reactive in nature, right-sided chest port is noted to be in the SVC.  Atherosclerotic calcifications are noted within the aorta the coronary arteries.  No pleural or pericardial effusions, no pneumothorax.  Patchy predominant ground-glass opacities noted within both lungs this may be simply related to atelectasis.  The pattern is atypical for edema no focal area of consolidation.  The patient also did have an upper extremity DVT study which was negative for DVT.  She notes that they did not include the breast in this study.  The patient has multiple comorbidities such as end-stage renal disease she is on hemodialysis, hypertension, CHF, COPD and tobacco use.  She has a right IJ PermCath in place.  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  She does have a family history of breast cancer she had a grandmother with a history of breast cancer and she is had mastectomies along with chemotherapy and radiation treatments.  Patient also noted that she had a hysterectomy and a single oophorectomy, she is unclear if it is the left or right remaining ovary.  She stated that the OBGYN told her that she had abnormal appearing cysts.  She did not undergo any chemotherapy or radiation post hysterectomy.  Today on exam the patient has notable right face edema, right neck edema, right shoulder edema and right breast edema some right flank edema  is also noted as well.  She has palpable pulses throughout.  We discussed the risks and benefits upper extremity venogram with possible intervention patient would like to proceed    ROS Other than ROS in the HPI, all other systems were negative.          Past Medical History:   Diagnosis Date    Asthma     Congestive heart failure (CMS HCC)     COPD (chronic obstructive pulmonary disease) (CMS HCC)     Esophageal reflux     ESRD (end stage renal disease) (CMS HCC)     HTN  (hypertension)     Sleep apnea          Allergies   Allergen Reactions    Lisinopril Swelling         Medications Prior to Admission     Prescriptions    albuterol sulfate (PROVENTIL OR VENTOLIN OR PROAIR) 90 mcg/actuation Inhalation oral inhaler    Take 1-2 Puffs by inhalation Every 6 hours as needed    aspirin (ECOTRIN) 81 mg Oral Tablet, Delayed Release (E.C.)    Take 1 Tablet (81 mg total) by mouth Once a day    carvediloL (COREG) 12.5 mg Oral Tablet    Take 1 Tablet (12.5 mg total) by mouth in the morning and 1 Tablet (12.5 mg total) before bedtime.    cinacalcet (SENSIPAR) 30 mg Oral Tablet    Take 1 Tablet (30 mg total) by mouth Once a day    cloNIDine HCL (CATAPRES) 0.1 mg Oral Tablet    Take 1 Tablet (0.1 mg total) by mouth in the morning and 1 Tablet (0.1 mg total) at noon and 1 Tablet (0.1 mg total) before bedtime.    dilTIAZem HCL (CARDIZEM) 120 mg Oral Tablet    Take 1 Tablet (120 mg total) by mouth in the morning and 1 Tablet (120 mg total) at noon and 1 Tablet (120 mg total) before bedtime.    doxazosin (CARDURA) 4 mg Oral Tablet    Take 1 Tablet (4 mg total) by mouth Every evening    furosemide (LASIX) 80 mg Oral Tablet    Take 1 Tablet (80 mg total) by mouth in the morning and 1 Tablet (80 mg total) in the evening.    hydrALAZINE (APRESOLINE) 100 mg Oral Tablet    Take 1 Tablet (100 mg total) by mouth in the morning and 1 Tablet (100 mg total) at noon and 1 Tablet (100 mg total) before bedtime.    hydrOXYzine pamoate (VISTARIL) 25 mg Oral Capsule    Take 1 Capsule (25 mg total) by mouth Every night as needed for Itching    isosorbide mononitrate (IMDUR) 60 mg Oral Tablet Sustained Release 24 hr    Take 1 Tablet (60 mg total) by mouth Every morning    labetaloL (NORMODYNE) 200 mg Oral Tablet    Take 1 Tablet (200 mg total) by mouth in the morning and 1 Tablet (200 mg total) at noon and 1 Tablet (200 mg total) before bedtime. 1.5 tabs.    loratadine (CLARITIN) 10 mg Oral Tablet    Take 1 Tablet (10  mg total) by mouth Once a day    losartan (COZAAR) 50 mg Oral Tablet    Take 1 Tablet (50 mg total) by mouth Once a day    montelukast (SINGULAIR) 10 mg Oral Tablet    Take 1 Tablet (10 mg total) by mouth Every evening    pantoprazole (PROTONIX)  40 mg Oral Tablet, Delayed Release (E.C.)    Take 1 Tablet (40 mg total) by mouth Once a day    spironolactone (ALDACTONE) 25 mg Oral Tablet    Take 1 Tablet (25 mg total) by mouth Once a day         acetaminophen (TYLENOL) tablet, 650 mg, Oral, Q6H PRN  aspirin (ECOTRIN) enteric coated tablet 81 mg, 81 mg, Oral, Daily  carvedilol (COREG) tablet, 12.5 mg, Oral, 2x/day  cinacalcet (SENSIPAR) tablet, 30 mg, Oral, Daily  cloNIDine (CATAPRES) tablet, 0.1 mg, Oral, 3x/day  dilTIAZem (CARDIZEM) tablet, 120 mg, Oral, 3x/day  doxazosin (CARDURA) tablet, 4 mg, Oral, QPM  furosemide (LASIX) tablet, 80 mg, Oral, 2x/day  heparin 10,000 units/mL injection, 5,000 Units, Subcutaneous, Q8HRS  hydrALAZINE (APRESOLINE) tablet, 100 mg, Oral, 3x/day  HYDROmorphone (DILAUDID) 0.5 mg/0.5 mL injection, 0.2 mg, Intravenous, Q4H PRN  hydrOXYzine pamoate (VISTARIL) capsule, 25 mg, Oral, HS PRN  ipratropium-albuterol 0.5 mg-3 mg(2.5 mg base)/3 mL Solution for Nebulization, 3 mL, Nebulization, Q6H PRN  isosorbide mononitrate (IMDUR) 24 hr extended release tablet, 60 mg, Oral, QAM  [Held by provider] labetalol (NORMODYNE) tablet, 200 mg, Oral, 3x/day  loratadine (CLARITIN) tablet, 10 mg, Oral, Daily  losartan (COZAAR) tablet, 50 mg, Oral, Daily  montelukast (SINGULAIR) 10 mg tablet, 10 mg, Oral, QPM  nicotine (NICODERM CQ) transdermal patch (mg/24 hr), 14 mg, Transdermal, Daily  NS flush syringe, 3 mL, Intracatheter, Q8HRS  NS flush syringe, 3 mL, Intracatheter, Q1H PRN  ondansetron (ZOFRAN) 2 mg/mL injection, 4 mg, Intravenous, Q6H PRN  pantoprazole (PROTONIX) delayed release tablet, 40 mg, Oral, Daily  spironolactone (ALDACTONE) tablet, 25 mg, Oral, Daily              Family Medical History:    None          Social History     Tobacco Use    Smoking status: Every Day     Types: Cigarettes    Smokeless tobacco: Never   Substance Use Topics    Alcohol use: Not Currently    Drug use: Yes     Types: Marijuana       Objective     Exam: Constitutional:  appears chronically ill and mildly obese  Respiratory:  Clear to auscultation bilaterally.   Cardiovascular:  regular rate and rhythm  regular rate and rhythm, S1, S2 normal, no murmur, click, rub or gallop     Vascular    pulses 2+ throughout and   she has right facial, right breast and right flank swelling   Integumentary:  Skin warm and dry, No rashes, No lesions and surgical scars noted left arm, and right chast from past permacath placement   Neurologic:  Grossly normal, CN II - XII grossly intact , Alert and oriented x3    Labs:    Reviewed:    I have reviewed all lab results.  Lab Results Today:    Results for orders placed or performed during the hospital encounter of 12/04/20 (from the past 24 hour(s))   BASIC METABOLIC PANEL   Result Value Ref Range    SODIUM 123 (L) 135 - 145 mmol/L    POTASSIUM 4.5 3.5 - 5.0 mmol/L    CHLORIDE 95 (L) 98 - 111 mmol/L    CO2 TOTAL 24 21 - 35 mmol/L    ANION GAP 4 mmol/L    CALCIUM 9.0 8.8 - 10.3 mg/dL    GLUCOSE 160 (H) 70 - 110 mg/dL    BUN 90 (  H) 10 - 25 mg/dL    CREATININE 10.86 (H) <=1.30 mg/dL    BUN/CREA RATIO 8     ESTIMATED GFR 4 (L) Avg: 99 mL/min/1.68m   D-DIMER   Result Value Ref Range    D-DIMER 650 (H) 200 - 229 ng/mL DDU   MAGNESIUM   Result Value Ref Range    MAGNESIUM 2.1 1.8 - 2.3 mg/dL   PHOSPHORUS   Result Value Ref Range    PHOSPHORUS 5.9 (H) 2.5 - 4.5 mg/dL   CBC WITH DIFF   Result Value Ref Range    WBC 12.3 (H) 3.7 - 11.0 x103/uL    RBC 3.67 (L) 3.85 - 5.22 x106/uL    HGB 10.7 (L) 11.5 - 16.0 g/dL    HCT 33.6 (L) 34.8 - 46.0 %    MCV 91.6 78.0 - 100.0 fL    MCH 29.2 26.0 - 32.0 pg    MCHC 31.8 31.0 - 35.5 g/dL    RDW-CV 17.2 (H) 11.5 - 15.5 %    PLATELETS 352 150 - 400 x103/uL    MPV 9.0 8.7 -  12.5 fL    NEUTROPHIL % 93 %    LYMPHOCYTE % 4 %    MONOCYTE % 2 %    EOSINOPHIL % 0 %    BASOPHIL % 0 %    NEUTROPHIL # 11.45 (H) 1.50 - 7.70 x103/uL    LYMPHOCYTE # 0.44 (L) 1.00 - 4.80 x103/uL    MONOCYTE # 0.30 0.20 - 1.10 x103/uL    EOSINOPHIL # <0.10 <=0.50 x103/uL    BASOPHIL # <0.10 <=0.20 x103/uL    IMMATURE GRANULOCYTE % 1 0 - 1 %    IMMATURE GRANULOCYTE # 0.10 (H) <0.10 [id number]  BASIC METABOLIC PANEL, NON-FASTING   Result Value Ref Range    SODIUM 128 (L) 135 - 145 mmol/L    POTASSIUM 4.5 3.5 - 5.0 mmol/L    CHLORIDE 89 (L) 98 - 111 mmol/L    CO2 TOTAL 23 21 - 35 mmol/L    ANION GAP 16 mmol/L    CALCIUM 8.9 8.8 - 10.3 mg/dL    GLUCOSE 98 70 - 110 mg/dL    BUN 97 (H) 10 - 25 mg/dL    CREATININE 11.53 (H) <=1.30 mg/dL    BUN/CREA RATIO 8     ESTIMATED GFR 4 (L) Avg: 99 mL/min/1.755m  MAGNESIUM   Result Value Ref Range    MAGNESIUM 2.1 1.8 - 2.3 mg/dL   PHOSPHORUS   Result Value Ref Range    PHOSPHORUS 6.5 (H) 2.5 - 4.5 mg/dL   PT/INR   Result Value Ref Range    INR 0.88 0.80 - 1.10   CBC WITH DIFF   Result Value Ref Range    WBC 14.6 (H) 3.7 - 11.0 x103/uL    RBC 3.44 (L) 3.85 - 5.22 x106/uL    HGB 10.1 (L) 11.5 - 16.0 g/dL    HCT 31.6 (L) 34.8 - 46.0 %    MCV 91.9 78.0 - 100.0 fL    MCH 29.4 26.0 - 32.0 pg    MCHC 32.0 31.0 - 35.5 g/dL    RDW-CV 17.3 (H) 11.5 - 15.5 %    PLATELETS 316 150 - 400 x103/uL    MPV 8.7 8.7 - 12.5 fL    NEUTROPHIL % 83 %    LYMPHOCYTE % 8 %    MONOCYTE % 8 %    EOSINOPHIL % 0 %  BASOPHIL % 0 %    NEUTROPHIL # 12.10 (H) 1.50 - 7.70 x103/uL    LYMPHOCYTE # 1.17 1.00 - 4.80 x103/uL    MONOCYTE # 1.10 0.20 - 1.10 x103/uL    EOSINOPHIL # <0.10 <=0.50 x103/uL    BASOPHIL # <0.10 <=0.20 x103/uL    IMMATURE GRANULOCYTE % 1 0 - 1 %    IMMATURE GRANULOCYTE # 0.14 (H) <0.10 x103/uL     Ordered:  None         Case was discussed with and reviewed with Dr. Kallie Edward.    Venancio Poisson, FNP-c  Nurse Practitioner   [facility] [place]  [phone] (on call  physician after 5pm)  [place]        Consultation appreciated, thank you for the opportunity to participate in your patient's care.     Active Hospital Problems    Diagnosis    SVC syndrome [I87.1]    ESRD (end stage renal disease) (CMS HCC) [N18.6]    Uncontrolled hypertension [I10]    Tobacco dependence [F17.200]    GERD (gastroesophageal reflux disease) [K21.9]    Insomnia [G47.00]         This note was partially generated using MModal Fluency Direct system, and there may be some incorrect words, spellings, and punctuation that were not noted in checking the note before saving.

## 2020-12-05 NOTE — Care Plan (Signed)
Patient is alert and oriented x 4 and currently on RA. Patient reports pain in her right breast and back. Patient had venograph and angioplasty today. Plan for Dialysis tomorrow. Continue to monitor patient.   Rockie Neighbours, RN  12/05/2020, 20:02  Problem: Adult Inpatient Plan of Care  Goal: Plan of Care Review  Outcome: Ongoing (see interventions/notes)  Goal: Patient-Specific Goal (Individualized)  Outcome: Ongoing (see interventions/notes)  Flowsheets (Taken 12/05/2020 0810)  Individualized Care Needs: To be kept informed, on RA, MWF dialysis, plan for Angiogram today for right breast swelling  Anxieties, Fears or Concerns: None  Goal: Absence of Hospital-Acquired Illness or Injury  Outcome: Ongoing (see interventions/notes)  Intervention: Identify and Manage Fall Risk  Flowsheets  Taken 12/05/2020 2000  Safety Promotion/Fall Prevention:   activity supervised   nonskid shoes/slippers when out of bed   safety round/check completed  Taken 12/05/2020 1800  Safety Promotion/Fall Prevention:   activity supervised   nonskid shoes/slippers when out of bed   safety round/check completed  Taken 12/05/2020 1400  Safety Promotion/Fall Prevention:   activity supervised   nonskid shoes/slippers when out of bed   safety round/check completed  Taken 12/05/2020 1200  Safety Promotion/Fall Prevention:   activity supervised   nonskid shoes/slippers when out of bed   safety round/check completed  Taken 12/05/2020 1000  Safety Promotion/Fall Prevention:   activity supervised   nonskid shoes/slippers when out of bed   safety round/check completed  Taken 12/05/2020 0810  Safety Promotion/Fall Prevention:   nonskid shoes/slippers when out of bed   safety round/check completed   activity supervised  Intervention: Prevent Skin Injury  Flowsheets  Taken 12/05/2020 2000  Body Position: positioned/repositioned independently  Skin Protection: adhesive use limited  Taken 12/05/2020 0810  Body Position: supine, head elevated  Skin Protection:  adhesive use limited  Intervention: Prevent and Manage VTE (Venous Thromboembolism) Risk  Flowsheets  Taken 12/05/2020 2000  VTE Prevention/Management:   ambulation promoted   dorsiflexion/plantar flexion performed   anticoagulant therapy maintained  Taken 12/05/2020 0810  VTE Prevention/Management:   ambulation promoted   anticoagulant therapy maintained   dorsiflexion/plantar flexion performed  Intervention: Prevent Infection  Flowsheets  Taken 12/05/2020 2000  Infection Prevention:   promote handwashing   rest/sleep promoted  Taken 12/05/2020 1800  Infection Prevention:   promote handwashing   rest/sleep promoted  Taken 12/05/2020 1400  Infection Prevention:   promote handwashing   rest/sleep promoted  Taken 12/05/2020 1200  Infection Prevention:   promote handwashing   rest/sleep promoted  Taken 12/05/2020 1000  Infection Prevention:   promote handwashing   rest/sleep promoted  Taken 12/05/2020 [id number]  Infection Prevention:   promote handwashing   rest/sleep promoted  Goal: Optimal Comfort and Wellbeing  Outcome: Ongoing (see interventions/notes)  Intervention: Provide Person-Centered Care  Recent Flowsheet Documentation  Taken 12/05/2020 0810 by Rockie Neighbours, [place] Relationship/Rapport:   care explained   questions answered  Goal: Rounds/Family Conference  Outcome: Ongoing (see interventions/notes)     Problem: Fall Injury Risk  Goal: Absence of Fall and Fall-Related Injury  Outcome: Ongoing (see interventions/notes)  Intervention: Identify and Manage Contributors  Recent Flowsheet Documentation  Taken 12/05/2020 0810 by Rockie Neighbours, RN  Self-Care Promotion:   independence encouraged   BADL personal objects within reach  Intervention: [place] Documentation  Taken 12/05/2020 2000 by Rockie Neighbours, RN  Safety Promotion/Fall Prevention:   activity supervised   nonskid shoes/slippers when out of bed   safety round/check completed  Taken 12/05/2020 1800 by Rockie Neighbours,  RN  Safety Promotion/Fall Prevention:   activity supervised   nonskid shoes/slippers when out of bed   safety round/check completed  Taken 12/05/2020 1400 by Rockie Neighbours, RN  Safety Promotion/Fall Prevention:   activity supervised   nonskid shoes/slippers when out of bed   safety round/check completed  Taken 12/05/2020 1200 by Rockie Neighbours, RN  Safety Promotion/Fall Prevention:   activity supervised   nonskid shoes/slippers when out of bed   safety round/check completed  Taken 12/05/2020 1000 by Rockie Neighbours, RN  Safety Promotion/Fall Prevention:   activity supervised   nonskid shoes/slippers when out of bed   safety round/check completed  Taken 12/05/2020 0810 by Rockie Neighbours, RN  Safety Promotion/Fall Prevention:   nonskid shoes/slippers when out of bed   safety round/check completed   activity supervised

## 2020-12-05 NOTE — Anesthesia Transfer of Care (Signed)
ANESTHESIA TRANSFER OF CARE   [name] is a 45 y.o. ,female, Weight: 81.6 kg (180 lb)   had Procedure(s):  VENOGRAM UPPER EXTREMITY - IMAGING ONLY  SURG VASC US GUIDED ACCESS  ANGIOPLASTY SVC - IMAGING ONLY  performed  12/05/20   Primary Service: Sharl Ma, MD    Past Medical History:   Diagnosis Date   . Asthma    . Congestive heart failure (CMS HCC)    . COPD (chronic obstructive pulmonary disease) (CMS HCC)    . Esophageal reflux    . ESRD (end stage renal disease) (CMS [place])    . HTN (hypertension)    . Sleep apnea       Allergy History as of 12/05/20     LISINOPRIL       Noted Status Severity Type Reaction    12/04/20 1626 Clemencia Course, RN 12/04/20 Active Low  Swelling              I completed my transfer of care / handoff to the receiving personnel during which we discussed:  Access, Airway, All key/critical aspects of case discussed, Analgesia, Antibiotics, Expectation of post procedure, Fluids/Product, Gave opportunity for questions and acknowledgement of understanding, Labs and PMHx                                                                      Last OR Temp: Temperature: 36.3 C (97.3 F)  ABG:  POTASSIUM   Date Value Ref Range Status   12/05/2020 4.5 3.5 - 5.0 mmol/L Final     CALCIUM   Date Value Ref Range Status   12/05/2020 8.9 8.8 - 10.3 mg/dL Final     Airway:* No LDAs found *  Blood pressure (!) 167/96, pulse 75, temperature 36.3 C (97.3 F), resp. rate 18, height 1.702 m ('5\' 7"'$ ), weight 81.6 kg (180 lb), SpO2 100 %.

## 2020-12-05 NOTE — Nurses Notes (Signed)
Pt rested in bed throughout the night. Complaints of right breast pain. PRN pain medication given. No s/s of distress. VS stable. NSR on the monitor. Trinna Post, RN

## 2020-12-06 ENCOUNTER — Inpatient Hospital Stay (HOSPITAL_COMMUNITY): Payer: Commercial Managed Care - PPO

## 2020-12-06 ENCOUNTER — Other Ambulatory Visit: Payer: Self-pay

## 2020-12-06 DIAGNOSIS — J449 Chronic obstructive pulmonary disease, unspecified: Secondary | ICD-10-CM

## 2020-12-06 DIAGNOSIS — D649 Anemia, unspecified: Secondary | ICD-10-CM

## 2020-12-06 DIAGNOSIS — N6459 Other signs and symptoms in breast: Secondary | ICD-10-CM

## 2020-12-06 DIAGNOSIS — R220 Localized swelling, mass and lump, head: Secondary | ICD-10-CM

## 2020-12-06 DIAGNOSIS — D72829 Elevated white blood cell count, unspecified: Secondary | ICD-10-CM

## 2020-12-06 DIAGNOSIS — I82210 Acute embolism and thrombosis of superior vena cava: Secondary | ICD-10-CM

## 2020-12-06 DIAGNOSIS — R221 Localized swelling, mass and lump, neck: Secondary | ICD-10-CM

## 2020-12-06 DIAGNOSIS — F17210 Nicotine dependence, cigarettes, uncomplicated: Secondary | ICD-10-CM

## 2020-12-06 DIAGNOSIS — E871 Hypo-osmolality and hyponatremia: Secondary | ICD-10-CM

## 2020-12-06 LAB — CBC WITH DIFF
BASOPHIL #: <0.1 10*3/uL (ref <=0.20)
BASOPHIL %: 0 %
EOSINOPHIL #: 0.12 10*3/uL (ref <=0.50)
EOSINOPHIL %: 1 %
HCT: 28.4 % — ABNORMAL LOW (ref 34.8–46.0)
HGB: 9.3 g/dL — ABNORMAL LOW (ref 11.5–16.0)
IMMATURE GRANULOCYTE #: <0.1 10*3/uL (ref <0.10)
IMMATURE GRANULOCYTE %: 1 % (ref 0–1)
LYMPHOCYTE #: 1.56 10*3/uL (ref 1.00–4.80)
LYMPHOCYTE %: 14 %
MCH: 29.1 pg (ref 26.0–32.0)
MCHC: 32.7 g/dL (ref 31.0–35.5)
MCV: 88.8 fL (ref 78.0–100.0)
MONOCYTE #: 1.17 10*3/uL — ABNORMAL HIGH (ref 0.20–1.10)
MONOCYTE %: 10 %
MPV: 8.3 fL — ABNORMAL LOW (ref 8.7–12.5)
NEUTROPHIL #: 8.45 10*3/uL — ABNORMAL HIGH (ref 1.50–7.70)
NEUTROPHIL %: 74 %
PLATELETS: 271 10*3/uL (ref 150–400)
RBC: 3.2 10*6/uL — ABNORMAL LOW (ref 3.85–5.22)
RDW-CV: 16.9 % — ABNORMAL HIGH (ref 11.5–15.5)
WBC: 11.4 10*3/uL — ABNORMAL HIGH (ref 3.7–11.0)

## 2020-12-06 LAB — BASIC METABOLIC PANEL
ANION GAP: 7 mmol/L
BUN/CREA RATIO: 8
BUN: 97 mg/dL — ABNORMAL HIGH (ref 10–25)
CALCIUM: 8.6 mg/dL — ABNORMAL LOW (ref 8.8–10.3)
CHLORIDE: 92 mmol/L — ABNORMAL LOW (ref 98–111)
CO2 TOTAL: 25 mmol/L (ref 21–35)
CREATININE: 11.55 mg/dL — ABNORMAL HIGH (ref <=1.30)
ESTIMATED GFR: 4 mL/min/{1.73_m2} — ABNORMAL LOW
GLUCOSE: 76 mg/dL (ref 70–110)
POTASSIUM: 4.7 mmol/L (ref 3.5–5.0)
SODIUM: 124 mmol/L — ABNORMAL LOW (ref 135–145)

## 2020-12-06 LAB — ECG 12 LEAD - ADULT
Atrial Rate: 85 {beats}/min
Calculated P Axis: 68 degrees
Calculated R Axis: 23 degrees
Calculated T Axis: 101 degrees
PR Interval: 180 ms
QRS Duration: 98 ms
QT Interval: 362 ms
QTC Calculation: 430 ms
Ventricular rate: 85 {beats}/min

## 2020-12-06 LAB — MAGNESIUM: MAGNESIUM: 2 mg/dL (ref 1.8–2.3)

## 2020-12-06 LAB — PHOSPHORUS: PHOSPHORUS: 7.9 mg/dL — ABNORMAL HIGH (ref 2.5–4.5)

## 2020-12-06 LAB — VITAMIN D 25 TOTAL: VITAMIN D: <7 ng/mL — ABNORMAL LOW (ref 30–100)

## 2020-12-06 MED ORDER — HEPARIN (PORCINE) 1,000 UNIT/ML INJECTION SOLUTION
3600.0000 [IU] | INTRAMUSCULAR | Status: AC
Start: 2020-12-06 — End: 2020-12-06
  Administered 2020-12-06: 08:00:00 3600 [IU]

## 2020-12-06 MED ORDER — SENNOSIDES 8.6 MG-DOCUSATE SODIUM 50 MG TABLET
1.0000 | ORAL_TABLET | Freq: Two times a day (BID) | ORAL | Status: DC
Start: 2020-12-06 — End: 2020-12-14
  Administered 2020-12-06 – 2020-12-07 (×4): 1 via ORAL
  Administered 2020-12-08: 09:00:00 0 via ORAL
  Administered 2020-12-08 – 2020-12-09 (×3): 1 via ORAL
  Administered 2020-12-10: 09:00:00 0 via ORAL
  Administered 2020-12-10 – 2020-12-11 (×2): 1 via ORAL
  Administered 2020-12-11: 09:00:00 0 via ORAL
  Administered 2020-12-12 – 2020-12-13 (×4): 1 via ORAL
  Filled 2020-12-06 (×14): qty 1

## 2020-12-06 MED ORDER — HYDROMORPHONE (PF) 0.5 MG/0.5 ML INJECTION SYRINGE
0.4000 mg | INJECTION | INTRAMUSCULAR | Status: DC | PRN
Start: 2020-12-06 — End: 2020-12-13
  Administered 2020-12-06 – 2020-12-13 (×27): 0.4 mg via INTRAVENOUS
  Filled 2020-12-06 (×27): qty 0.5

## 2020-12-06 MED ORDER — OXYCODONE-ACETAMINOPHEN 5 MG-325 MG TABLET
1.0000 | ORAL_TABLET | ORAL | Status: DC | PRN
Start: 2020-12-06 — End: 2020-12-14
  Administered 2020-12-06 – 2020-12-13 (×5): 1 via ORAL
  Filled 2020-12-06 (×6): qty 1

## 2020-12-06 MED ORDER — POLYETHYLENE GLYCOL 3350 17 GRAM ORAL POWDER PACKET
17.0000 g | Freq: Every day | ORAL | Status: DC
Start: 2020-12-06 — End: 2020-12-14
  Administered 2020-12-06 – 2020-12-07 (×2): 17 g via ORAL
  Administered 2020-12-08 – 2020-12-11 (×4): 0 g via ORAL
  Administered 2020-12-12 – 2020-12-13 (×2): 17 g via ORAL
  Filled 2020-12-06 (×6): qty 1

## 2020-12-06 MED ORDER — CLOPIDOGREL 75 MG TABLET
75.0000 mg | ORAL_TABLET | Freq: Every day | ORAL | Status: DC
Start: 2020-12-06 — End: 2020-12-14
  Administered 2020-12-06 – 2020-12-07 (×2): 75 mg via ORAL
  Administered 2020-12-08: 09:00:00 0 mg via ORAL
  Administered 2020-12-09 – 2020-12-13 (×5): 75 mg via ORAL
  Filled 2020-12-06 (×8): qty 1

## 2020-12-06 MED ORDER — SEVELAMER CARBONATE 800 MG TABLET
2400.0000 mg | ORAL_TABLET | Freq: Three times a day (TID) | ORAL | Status: DC
Start: 2020-12-06 — End: 2020-12-14
  Administered 2020-12-06 – 2020-12-07 (×4): 2400 mg via ORAL
  Administered 2020-12-07 – 2020-12-13 (×19): 0 mg via ORAL
  Filled 2020-12-06 (×9): qty 3

## 2020-12-06 MED ORDER — CINACALCET 60 MG TABLET
60.0000 mg | ORAL_TABLET | Freq: Every day | ORAL | Status: DC
Start: 2020-12-07 — End: 2020-12-14
  Administered 2020-12-07: 08:00:00 60 mg via ORAL
  Administered 2020-12-08: 09:00:00 0 mg via ORAL
  Administered 2020-12-09 – 2020-12-13 (×5): 60 mg via ORAL
  Filled 2020-12-06 (×8): qty 1

## 2020-12-06 MED ORDER — CYCLOBENZAPRINE 5 MG TABLET
5.0000 mg | ORAL_TABLET | Freq: Three times a day (TID) | ORAL | Status: DC | PRN
Start: 2020-12-06 — End: 2020-12-14
  Administered 2020-12-08 – 2020-12-13 (×2): 5 mg via ORAL
  Filled 2020-12-06 (×2): qty 1

## 2020-12-06 MED ORDER — DIAZEPAM 5 MG TABLET
5.0000 mg | ORAL_TABLET | Freq: Four times a day (QID) | ORAL | Status: DC | PRN
Start: 2020-12-06 — End: 2020-12-14
  Administered 2020-12-11 (×2): 5 mg via ORAL
  Filled 2020-12-06 (×2): qty 1

## 2020-12-06 NOTE — Care Plan (Signed)
Patient is AxOx4, NSR, RA. Patient is SB assist to [facility]. Patient had uncontrollable pain, meds given per order. Patient had abdominal discomfort however had relief after BM. Patient had dialysis today. Patient tolerating activity. Will continue to monitor.    Montavious Wierzba M. Jamal Collin, RN  12/06/2020, 18:17     Problem: Pain Acute  Goal: Optimal Pain Control and Function  Outcome: Ongoing (see interventions/notes)  Intervention: Optimize Psychosocial Wellbeing  Recent Flowsheet Documentation  Taken 12/06/2020 0800 by Micah Flesher., RN  Diversional Activities: television     Problem: Hemodialysis  Goal: Safe, Effective Therapy Delivery  Outcome: Ongoing (see interventions/notes)  Goal: Effective Tissue Perfusion  Outcome: Ongoing (see interventions/notes)  Goal: Absence of Infection Signs and Symptoms  Outcome: Ongoing (see interventions/notes)  Intervention: Prevent or Manage Infection  Recent Flowsheet Documentation  Taken 12/06/2020 0800 by Micah Flesher., RN  Fever Reduction/Comfort Measures:   lightweight bedding   lightweight clothing  Infection Prevention: promote handwashing     Problem: Anxiety  Goal: Anxiety Reduction or Resolution  Outcome: Ongoing (see interventions/notes)  Intervention: Promote Anxiety Reduction  Recent Flowsheet Documentation  Taken 12/06/2020 0800 by Micah Flesher., RN  Family/Support System Care: support provided

## 2020-12-06 NOTE — Consults (Signed)
[facility]  Vascular Surgery Consult      [facility]  Follow Up Note          [name], [name], 45 y.o. female  Date of Service: 12/06/2020  Date of Birth:  31-Dec-1975    Hospital Day:  LOS: 2 days     Chief Complaint:      Assessment/Recommendations:  Assessment:  Right SVC syndrome   ? Right breast malignancy  Status post  Right upper extremity venogram   right subclavian vein innominate vein and superior vena cava venogram   balloon angioplasty right innominate vein   ultrasound-guided right brachial vein access 12/05/2020    Plan:  Start Plavix   Awaiting general surgery eval      Subjective     crying she is having pain  right breast    Objective   Noted some improvement of right facial swelling   Right breast is very edematous   Right brachial site no hematoma     Temperature: 36.6 C (97.9 F)  Heart Rate: 66  BP (Non-Invasive): (!) 135/98  Respiratory Rate: 18  SpO2: 100 %      Labs:    I have reviewed all lab results.  Lab Results Today:    Results for orders placed or performed during the hospital encounter of 12/04/20 (from the past 24 hour(s))   POC BLOOD GLUCOSE (RESULTS)   Result Value Ref Range    GLUCOSE, POC 111 (H) 70 - 110 mg/dl   VITAMIN D 25 TOTAL   Result Value Ref Range    VITAMIN D <7 (L) 30 - 100 ng/mL   BASIC METABOLIC PANEL   Result Value Ref Range    SODIUM 124 (L) 135 - 145 mmol/L    POTASSIUM 4.7 3.5 - 5.0 mmol/L    CHLORIDE 92 (L) 98 - 111 mmol/L    CO2 TOTAL 25 21 - 35 mmol/L    ANION GAP 7 mmol/L    CALCIUM 8.6 (L) 8.8 - 10.3 mg/dL    GLUCOSE 76 70 - 110 mg/dL    BUN 97 (H) 10 - 25 mg/dL    CREATININE 11.55 (H) <=1.30 mg/dL    BUN/CREA RATIO 8     ESTIMATED GFR 4 (L) Avg: 99 mL/min/1.67m   MAGNESIUM   Result Value Ref Range    MAGNESIUM 2.0 1.8 - 2.3 mg/dL   PHOSPHORUS   Result Value Ref Range    PHOSPHORUS 7.9 (H) 2.5 - 4.5 mg/dL   CBC WITH DIFF   Result Value Ref Range    WBC 11.4 (H) 3.7 - 11.0 x103/uL    RBC 3.20 (L) 3.85 - 5.22 x106/uL    HGB 9.3 (L) 11.5 -  16.0 g/dL    HCT 28.4 (L) 34.8 - 46.0 %    MCV 88.8 78.0 - 100.0 fL    MCH 29.1 26.0 - 32.0 pg    MCHC 32.7 31.0 - 35.5 g/dL    RDW-CV 16.9 (H) 11.5 - 15.5 %    PLATELETS 271 150 - 400 x103/uL    MPV 8.3 (L) 8.7 - 12.5 fL    NEUTROPHIL % 74 %    LYMPHOCYTE % 14 %    MONOCYTE % 10 %    EOSINOPHIL % 1 %    BASOPHIL % 0 %    NEUTROPHIL # 8.45 (H) 1.50 - 7.70 x103/uL    LYMPHOCYTE # 1.56 1.00 - 4.80 x103/uL    MONOCYTE # 1.17 (H) 0.20 -  1.10 x103/uL    EOSINOPHIL # 0.12 <=0.50 x103/uL    BASOPHIL # <0.10 <=0.20 x103/uL    IMMATURE GRANULOCYTE % 1 0 - 1 %    IMMATURE GRANULOCYTE # <0.10 <0.10 x103/uL   ECG 12 LEAD - ADULT   Result Value Ref Range    Ventricular rate 85 BPM    Atrial Rate 85 BPM    PR Interval 180 ms    QRS Duration 98 ms    QT Interval 362 ms    QTC Calculation 430 ms    Calculated P Axis 68 degrees    Calculated R Axis 23 degrees    Calculated T Axis 101 degrees       Imaging Studies:  N/A      Case was discussed with and reviewed with Dr. Kallie Edward.  Venancio Poisson FNP-c    Nurse Practitioner   [facility] [place]  [phone] (on call physician after 5pm)  [place]        This note was partially generated using MModal Fluency Direct system, and there may be some incorrect words, spellings, and punctuation that were not noted in checking the note before saving.

## 2020-12-06 NOTE — Care Management Notes (Signed)
[place] Management Initial Evaluation    Patient Name: [name]  Date of Birth: [birth year]  Sex: female  Date/Time of Admission: 12/04/2020  4:22 PM  Room/Bed: 7129/A  Payor: HUMANA MEDICARE / Plan: HUMANA CHOICE PPO / Product Type: PPO /   Primary Care Providers:  Deel, Leona Carry, FNP, FNP (General)    Pharmacy Info:   Preferred [place], [place]    [place] [address]    Phone: [phone] Fax: [phone]    Hours: Not open 24 hours          Emergency Contact Info:   Extended Emergency Contact Information  Primary Emergency Contact: Garner Nash  Mobile Phone: [phone]  Relation: Sister    History:   Glynis Hunsucker is a 45 y.o., female, admitted SVC syndrome    Height/Weight: 170.2 cm (5' 7" ) / 104 kg (228 lb 6.4 oz)     LOS: 2 days   Admitting Diagnosis: SVC syndrome [I87.1]    Assessment:   Met with patient to assess PLOF and discharge planning. Patient lives alone, she reports a homemaker aide M-F with though age and disabled. She received dialisys MWF at [facility]. She uses public transportation. She has a cane, BSC and hospital bed at home. Her PCP is Emi Belfast FNP and she uses [facility]. She does not anticipate any DC needs at this time.     12/06/20 [place] an age group to open "lives with" row.  Adult   Lives With alone   Able to Return to Prior Arrangements yes   Living Arrangement Comments lives alone has caregiver M-F (16hours total per week)   Care Management Plan   Discharge Planning Status initial meeting   Projected Discharge Date 12/10/20   Discharge plan discussed with: Patient   Discharge Needs Assessment   Equipment Currently Used at Home cane, straight;commode;hospital bed   Equipment Needed After Discharge none   Discharge Facility/Level of Care Needs Home (Patient/Family Member/other)(code 1)   Referral Information    Arrived From acute hospital, other   [place] Other - See Comments  Joneen Boers)   ADVANCE DIRECTIVES   Does the Patient have an Advance Directive? No, Information Offered and Given       Discharge Plan:  Home (Patient/Family Member/other) (code 1)  Home with family    The patient will continue to be evaluated for developing discharge needs.     Case Manager: Amaryllis Dyke, RN  Phone: [phone]

## 2020-12-06 NOTE — Progress Notes (Signed)
[facility]  Hospitalist Progress Note    [name]       45 y.o.       Date of service: 12/06/2020  Date of Admission:  12/04/2020    Hospital Day:  LOS: 2 days   CC: SVC syndrome    Subjective: Patient seen and examined in the dialysis unit. S/p venogram and balloon angioplasty of R innominate vein. Reports significant back discomfort from being on operating table. No significant improvement in swelling but is getting HD currently. R arm does appear to be slightly better, still w/ some swelling on the dorsum of her hand.     Objective:   Filed Vitals:    12/06/20 0500 12/06/20 0542 12/06/20 0805 12/06/20 0853   BP: (!) 181/106  (!) 183/107    Pulse: 65  66    Resp: '18 18  18   '$ Temp: 36.6 C (97.9 F)      SpO2: 100%        O2 delivery: None (Room Air)     I have reviewed the vitals.      Input/Output    Intake/Output Summary (Last 24 hours) at 12/06/2020 1105  Last data filed at 12/06/2020 0900  Gross per 24 hour   Intake 1010 ml   Output --   Net 1010 ml         acetaminophen (TYLENOL) tablet, 650 mg, Oral, Q6H PRN  aspirin (ECOTRIN) enteric coated tablet 81 mg, 81 mg, Oral, Daily  carvedilol (COREG) tablet, 12.5 mg, Oral, 2x/day  [START ON 12/07/2020] cinacalcet (SENSIPAR) tablet, 60 mg, Oral, Daily  cloNIDine (CATAPRES) tablet, 0.1 mg, Oral, 3x/day  dilTIAZem (CARDIZEM) tablet, 120 mg, Oral, 3x/day  doxazosin (CARDURA) tablet, 4 mg, Oral, QPM  furosemide (LASIX) tablet, 80 mg, Oral, 2x/day  heparin 10,000 units/mL injection, 5,000 Units, Subcutaneous, Q8HRS  hydrALAZINE (APRESOLINE) tablet, 100 mg, Oral, 3x/day  HYDROmorphone (DILAUDID) 0.5 mg/0.5 mL injection, 0.2 mg, Intravenous, Q4H PRN  hydrOXYzine pamoate (VISTARIL) capsule, 25 mg, Oral, HS PRN  ipratropium-albuterol 0.5 mg-3 mg(2.5 mg base)/3 mL Solution for Nebulization, 3 mL, Nebulization, Q6H PRN  isosorbide mononitrate (IMDUR) 24 hr extended release tablet, 60 mg, Oral, QAM  [Held by provider] labetalol (NORMODYNE) tablet, 200 mg,  Oral, 3x/day  loratadine (CLARITIN) tablet, 10 mg, Oral, Daily  losartan (COZAAR) tablet, 50 mg, Oral, Daily  montelukast (SINGULAIR) 10 mg tablet, 10 mg, Oral, QPM  nicotine (NICODERM CQ) transdermal patch (mg/24 hr), 14 mg, Transdermal, Daily  NS flush syringe, 3 mL, Intracatheter, Q8HRS  NS flush syringe, 3 mL, Intracatheter, Q1H PRN  ondansetron (ZOFRAN) 2 mg/mL injection, 4 mg, Intravenous, Q6H PRN  pantoprazole (PROTONIX) delayed release tablet, 40 mg, Oral, Daily  sevelamer carbonate (RENVELA) tablet, 2,400 mg, Oral, 3x/day-Meals  spironolactone (ALDACTONE) tablet, 25 mg, Oral, Daily        Physical Exam:  General: No acute distress, In bed  HEENT: R facial and neck swelling  Cardiac: RRR  Chest: R breast swelling. Permcath to R chest wall  Respiratory: CTA  Abdomen: Distended along R abdomen.   Extremities: R arm > L arm. 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     CBC (Last 24 Hours):    Recent Results last 24 hours     12/06/20  0812   WBC 11.4*   HGB 9.3*   HCT 28.4*   MCV 88.8   PLTCNT 271         BMP (Last 24 Hours):    Recent  Results last 24 hours     12/06/20  0812   SODIUM 124*   POTASSIUM 4.7   CHLORIDE 92*   CO2 25   BUN 97*   CREATININE 11.55*   CALCIUM 8.6*   GLUCOSENF 76           I have reviewed all labs.    Micro: No results found for any visits on 12/04/20 (from the past 96 hour(s)).    Radiology:         PT/OT: No    Consults: Vascular Surgery, Nephrology, Gen Surgery    Assessment/ Plan:   Active Hospital Problems    Diagnosis   . Primary Problem: SVC syndrome   . ESRD (end stage renal disease) (CMS [place])   . Uncontrolled hypertension   . Tobacco dependence   . GERD (gastroesophageal reflux disease)   . Insomnia     Possible SVC Syndrome  - Outside CTA w/ concern for moderate narrowing of the distal R subclavian vein at its junction  - Has significant R sided swelling  - Vascular Surgery consult appreciated  - s/p Right venogram and balloon angioplasty of R innominate vein. Has some back pain  post procedure but otherwise tolerated well.   - HD and fluid removal to see if swelling subsides.   - Gen Surg consult for breast swelling.     Resistant hypertension  - Reports difficult to control hypertension and on multiple anti-hypertensive including clonidine PO and patch, coreg, labetalol, Cardizem, Cardura, lasix, Imdur, losartan and aldactone. Patient has all her bottles in the room with her.   - Can continue home meds for now and adjust as needed. Will appreciate nephro assistance in adjusting  - Monitor BP  - Telemetry    Leukocytosis  - Completed 7 days of antibiotics at outside hospital  - Continue to monitor and hold off on Abx.   - Suspecting reactive    ESRD  Hyponatremia  - Diagnosed in 2010. Permcath placed 2021  - Nephro consult for HD  - Continue cinacalcet + renelva    Anemia  - Hb stable, likely 2/2 to CKD    GERD  - Continue PPI    Tobacco dependence  - Nicotine patch    Asthma/COPD  - Not in exacerbation  - nebs prn  - Continue Singulair       DVT/PE Prophylaxis: Heparin    Disposition Planning: pending     Sharl Ma, MD

## 2020-12-06 NOTE — Consults (Signed)
Nephrology Consult follow-up:  Date of service: 12/06/2020  Hospital Day:  LOS: 2 days     Assessment:  Patient is a 65 YOF with a PMH of CHF, HTN, HLD, COPD and ESRD on HD who presents with concern for SVC syndrome. Nephrology consulted for ESRD Management.      ESRD on HD via R IJ TDC   Normocytic Anemia  Leukocytosis  Moderate Asymptomatic Hyponatremia  Azotemia  Hyperphosphatemia  ? Electrolytes: Na 124, K 4.7, Cl 92  ? Acid/base imbalance: Bicarb 25, AG 07, no BG for complete assessment  ? BMD: Ca 8.6 (uncorrected for albumin of 3.8), Phos 7.9, Mg 2.0,  iPTH: 748.1, Vit D 25: < 7  ? Anemia: Hgb 9.3          Recommendations:  - iHD today since procedure ran late yesterday and patient missed HD:             - 4 HR, 350 BFR, 800 DFR, 2K/2.5 Ca bath, 2-3 L UF as hemodynamics tolerate  - Renvela increased to 2400 mg TID w/ meals (ordered)   - Sensipar 60 mg daily (ordered)   - Strict I&O's with daily standing weights  - BMP, Mg, Phos on MWF while admitted   - Nephrology will continue to follow, please page with any inquiries         Subjective: laying supine in bed today. Agreeable to iHD today.     Physical Exam:  BP (!) 183/107   Pulse 66   Temp 36.6 C (97.9 F)   Resp 18   Ht 1.702 m ('5\' 7"'$ )   Wt 104 kg (228 lb 6.4 oz)   SpO2 100%   BMI 35.77 kg/m   General: laying supine in bed, vital signs reviewed   HEENT: MMM, no thrush, EOMI   Neck: supple, symmetrical, trachea appears midline   Lungs: clear to auscultation b/l, no crackles, symmetrical chest wall rise   Cardiovascular: no rub, no gallop, 1+ b/l LE edema  Abdomen: soft, non tender, non distended  Extremities: No cyanosis, grossly intact   Skin: Skin warm and dry, no rash    Neurologic: No tremor, No asterixis   Access: R IJ TDC     IO  10/12 0700 - 10/13 0659  In: 770 [P.O.:720; I.V.:50]  Out: -      I have reviewed the current medications     Labs: I have reviewed the labs       Nita Sells, [place]  Department of  Nephrology

## 2020-12-07 DIAGNOSIS — N6310 Unspecified lump in the right breast, unspecified quadrant: Secondary | ICD-10-CM

## 2020-12-07 DIAGNOSIS — N2581 Secondary hyperparathyroidism of renal origin: Secondary | ICD-10-CM

## 2020-12-07 LAB — CBC WITH DIFF
BASOPHIL #: <0.1 10*3/uL (ref <=0.20)
BASOPHIL %: 0 %
EOSINOPHIL #: 0.18 10*3/uL (ref <=0.50)
EOSINOPHIL %: 1 %
HCT: 27.8 % — ABNORMAL LOW (ref 34.8–46.0)
HGB: 9.1 g/dL — ABNORMAL LOW (ref 11.5–16.0)
IMMATURE GRANULOCYTE #: <0.1 10*3/uL (ref <0.10)
IMMATURE GRANULOCYTE %: 1 % (ref 0–1)
LYMPHOCYTE #: 1.08 10*3/uL (ref 1.00–4.80)
LYMPHOCYTE %: 8 %
MCH: 29.4 pg (ref 26.0–32.0)
MCHC: 32.7 g/dL (ref 31.0–35.5)
MCV: 90 fL (ref 78.0–100.0)
MONOCYTE #: 1.84 10*3/uL — ABNORMAL HIGH (ref 0.20–1.10)
MONOCYTE %: 14 %
MPV: 8.7 fL (ref 8.7–12.5)
NEUTROPHIL #: 10.28 10*3/uL — ABNORMAL HIGH (ref 1.50–7.70)
NEUTROPHIL %: 76 %
PLATELETS: 256 10*3/uL (ref 150–400)
RBC: 3.09 10*6/uL — ABNORMAL LOW (ref 3.85–5.22)
RDW-CV: 17.2 % — ABNORMAL HIGH (ref 11.5–15.5)
WBC: 13.5 10*3/uL — ABNORMAL HIGH (ref 3.7–11.0)

## 2020-12-07 LAB — BASIC METABOLIC PANEL
ANION GAP: 12 mmol/L
BUN/CREA RATIO: 6
BUN: 54 mg/dL — ABNORMAL HIGH (ref 10–25)
CALCIUM: 9.2 mg/dL (ref 8.8–10.3)
CHLORIDE: 95 mmol/L — ABNORMAL LOW (ref 98–111)
CO2 TOTAL: 25 mmol/L (ref 21–35)
CREATININE: 8.33 mg/dL — ABNORMAL HIGH (ref <=1.30)
ESTIMATED GFR: 6 mL/min/{1.73_m2} — ABNORMAL LOW
GLUCOSE: 89 mg/dL (ref 70–110)
POTASSIUM: 5.2 mmol/L — ABNORMAL HIGH (ref 3.5–5.0)
SODIUM: 132 mmol/L — ABNORMAL LOW (ref 135–145)

## 2020-12-07 LAB — MAGNESIUM: MAGNESIUM: 2.1 mg/dL (ref 1.8–2.3)

## 2020-12-07 LAB — PHOSPHORUS: PHOSPHORUS: 6.8 mg/dL — ABNORMAL HIGH (ref 2.5–4.5)

## 2020-12-07 MED ORDER — HEPARIN (PORCINE) 1,000 UNIT/ML INJECTION SOLUTION
3600.0000 [IU] | INTRAMUSCULAR | Status: AC
Start: 2020-12-07 — End: 2020-12-07
  Administered 2020-12-07: 22:00:00 3600 [IU]

## 2020-12-07 NOTE — Progress Notes (Signed)
[facility]  Hospitalist Progress Note    [name]       45 y.o.       Date of service: 12/07/2020  Date of Admission:  12/04/2020    Hospital Day:  LOS: 3 days   CC: SVC syndrome    Subjective:  Patient was seen and evaluated at the bedside in no acute cardiopulmonary distress.  She is S/p venogram and balloon angioplasty of R innominate vein.  Was reporting right breast pain on stated that have other pain from the procedure was improving.  Have facial swelling and right arm swelling per patient was significantly improved however her breast remains painful, warm and swollen.     Objective:   Filed Vitals:    12/07/20 0855 12/07/20 1227 12/07/20 1334 12/07/20 1500   BP:   (!) 140/97 (!) 159/97   Pulse:   68 77   Resp: '18 17  18   '$ Temp:    36.6 C (97.9 F)   SpO2:    100%     O2 delivery: None (Room Air)     I have reviewed the vitals.      Input/Output    Intake/Output Summary (Last 24 hours) at 12/07/2020 1730  Last data filed at 12/07/2020 1000  Gross per 24 hour   Intake 720 ml   Output --   Net 720 ml         acetaminophen (TYLENOL) tablet, 650 mg, Oral, Q6H PRN  aspirin (ECOTRIN) enteric coated tablet 81 mg, 81 mg, Oral, Daily  carvedilol (COREG) tablet, 12.5 mg, Oral, 2x/day  cinacalcet (SENSIPAR) tablet, 60 mg, Oral, Daily  cloNIDine (CATAPRES) tablet, 0.1 mg, Oral, 3x/day  clopidogrel (PLAVIX) 75 mg tablet, 75 mg, Oral, Daily  cyclobenzaprine (FLEXERIL) tablet, 5 mg, Oral, Q8H PRN  diazePAM (VALIUM) tablet, 5 mg, Oral, Q6H PRN  dilTIAZem (CARDIZEM) tablet, 120 mg, Oral, 3x/day  doxazosin (CARDURA) tablet, 4 mg, Oral, QPM  furosemide (LASIX) tablet, 80 mg, Oral, 2x/day  hydrALAZINE (APRESOLINE) tablet, 100 mg, Oral, 3x/day  HYDROmorphone (DILAUDID) 0.5 mg/0.5 mL injection, 0.4 mg, Intravenous, Q4H PRN  hydrOXYzine pamoate (VISTARIL) capsule, 25 mg, Oral, HS PRN  ipratropium-albuterol 0.5 mg-3 mg(2.5 mg base)/3 mL Solution for Nebulization, 3 mL, Nebulization, Q6H PRN  isosorbide mononitrate  (IMDUR) 24 hr extended release tablet, 60 mg, Oral, QAM  [Held by provider] labetalol (NORMODYNE) tablet, 200 mg, Oral, 3x/day  loratadine (CLARITIN) tablet, 10 mg, Oral, Daily  losartan (COZAAR) tablet, 50 mg, Oral, Daily  montelukast (SINGULAIR) 10 mg tablet, 10 mg, Oral, QPM  nicotine (NICODERM CQ) transdermal patch (mg/24 hr), 14 mg, Transdermal, Daily  NS flush syringe, 3 mL, Intracatheter, Q8HRS  NS flush syringe, 3 mL, Intracatheter, Q1H PRN  ondansetron (ZOFRAN) 2 mg/mL injection, 4 mg, Intravenous, Q6H PRN  oxyCODONE-acetaminophen (PERCOCET) 5-'325mg'$  per tablet, 1 Tablet, Oral, Q4H PRN  pantoprazole (PROTONIX) delayed release tablet, 40 mg, Oral, Daily  polyethylene glycol (MIRALAX) oral packet, 17 g, Oral, Daily  sennosides-docusate sodium (SENOKOT-S) 8.6-'50mg'$  per tablet, 1 Tablet, Oral, 2x/day  sevelamer carbonate (RENVELA) tablet, 2,400 mg, Oral, 3x/day-Meals  spironolactone (ALDACTONE) tablet, 25 mg, Oral, Daily        Physical Exam:  General: No acute distress, In bed  HEENT: R facial and neck swelling  Cardiac: RRR  Chest: R breast swelling. Permcath to R chest wall  Respiratory: CTA  Abdomen: Distended along R abdomen.   Extremities: R arm > L arm. 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  CBC (Last 24 Hours):    Recent Results last 24 hours     12/07/20  0532   WBC 13.5*   HGB 9.1*   HCT 27.8*   MCV 90.0   PLTCNT 256         BMP (Last 24 Hours):    Recent Results last 24 hours     12/07/20  0532   SODIUM 132*   POTASSIUM 5.2*   CHLORIDE 95*   CO2 25   BUN 54*   CREATININE 8.33*   CALCIUM 9.2   GLUCOSENF 89           I have reviewed all labs.    Micro: No results found for any visits on 12/04/20 (from the past 96 hour(s)).    Radiology:         PT/OT: No    Consults: Vascular Surgery, Nephrology, Gen Surgery    Assessment/ Plan:   Active Hospital Problems    Diagnosis   . Primary Problem: SVC syndrome   . ESRD (end stage renal disease) (CMS [place])   . Uncontrolled hypertension   . Tobacco dependence   .  GERD (gastroesophageal reflux disease)   . Insomnia     Possible SVC Syndrome  - Outside CTA w/ concern for moderate narrowing of the distal R subclavian vein at its junction  - Had significant R sided swelling  - Vascular Surgery consult appreciated  - s/p Right venogram and balloon angioplasty of R innominate vein. Has some back pain post procedure but otherwise tolerated well.   -swelling improved however breast continues to be swollen  - continue HD and fluid removal to see if swelling subsides.   - Gen Surg consult for breast swelling.   -right breast ultrasound ordered    Resistant hypertension  - Reports difficult to control hypertension and on multiple anti-hypertensive including clonidine PO and patch, coreg, labetalol, Cardizem, Cardura, lasix, Imdur, losartan and aldactone. Patient has all her bottles in the room with her.   - Can continue home meds for now and adjust as needed. Will appreciate nephro assistance in adjusting  - Monitor BP  - Telemetry    Leukocytosis  - Completed 7 days of antibiotics at outside hospital  - Continue to monitor and hold off on Abx.   - Suspecting reactive    ESRD  Hyponatremia  - Diagnosed in 2010. Permcath placed 2021  - Nephro consult for HD  - Continue cinacalcet + renelva    Anemia  - Hb stable, likely 2/2 to CKD    GERD  - Continue PPI    Tobacco dependence  - Nicotine patch    Asthma/COPD  - Not in exacerbation  - nebs prn  - Continue Singulair       DVT/PE Prophylaxis: Heparin however patient is refusing heparin    Disposition Planning: pending     Marissa Nestle, MD

## 2020-12-07 NOTE — Consults (Signed)
[facility] General Surgery  Consult Note    [name]       45 y.o.       Date of service: 12/07/2020  Date of Admission:  12/04/2020    Hospital Day:  LOS: 3 days     HISTORY OF PRESENT ILLNESS:  [name] is a 45 y.o. female who was admitted to [facility] with swelling of right breast and chest wall.  Patient says that she has family history of breast cancer in mother.  She had menarche at age 55, no pregnancies, used birth control pills on and off for five years.  Status post hysterectomy and possibly perimenopausal.  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  She tells me that her right chest wall and buttock swelling has decreased but right breast swelling has stayed the same for the past two weeks.  Denies recent breast imaging or nipple drainage.    PAST MEDICAL HISTORY:  Past Medical History:   Diagnosis Date   . Asthma    . Congestive heart failure (CMS HCC)    . COPD (chronic obstructive pulmonary disease) (CMS HCC)    . Esophageal reflux    . ESRD (end stage renal disease) (CMS [place])    . HTN (hypertension)    . Sleep apnea          PAST SURGICAL HISTORY:  History reviewed. No past surgical history pertinent negatives.      FAMILY HISTORY:  Family Medical History:    None         SOCIAL HISTORY:  Social History     Socioeconomic History   . Marital status: Divorced     Spouse name: Not on file   . Number of children: Not on file   . Years of education: Not on file   . Highest education level: Not on file   Occupational History   . Not on file   Tobacco Use   . Smoking status: Every Day     Types: Cigarettes   . Smokeless tobacco: Never   Substance and Sexual Activity   . Alcohol use: Not Currently   . Drug use: Yes     Types: Marijuana   . Sexual activity: Not on file   Other Topics Concern   . Not on file   Social History Narrative   . Not on file     Social Determinants of Health     Financial Resource Strain: Not on file   Food Insecurity: Not on file   Transportation Needs:  Not on file   Physical Activity: Not on file   Stress: Not on file   Intimate Partner Violence: Not on file   Housing Stability: Not on file       ALLERGIES:  Allergies   Allergen Reactions   . Lisinopril Swelling     PROBLEM LIST:  Patient Active Problem List   Diagnosis   . SVC syndrome   . ESRD (end stage renal disease) (CMS [place])   . Uncontrolled hypertension   . Tobacco dependence   . GERD (gastroesophageal reflux disease)   . Insomnia       REVIEW OF SYSTEMS:   Constitutional:  denies unexplained weight loss, night sweats, fatigue/malaise/lethargy, sleeping Eyes visual changes, headache, eye pain, or  double vision   ENT: Denies runny nose, frequent nose bleeds, sinus pain, stuffy ears, ear pain, ringing in ears (tinnitus), gingival bleeding, toothache, sore throat, pain with swallowing  Cardiovascular: Denies  chest pain, shortness of breath, or exercise intolerance  Respiratory: Denies cough, sputum, wheeze, haemoptysis, or shortness of breath   Gastrointestinal: Denies abdominal pain, unintentional weight loss, difficulty swallowing, bloating, cramping, anorexia, food avoidance, nausea/vomiting, diarrhea/constipation, or inability to pass gas   Genitourinary :  Denies urinary incontinence, dysuria, hematuria, nocturia, or polyuria  Musculoskeletal: Denies pain, stiffness , joint swelling   Integumentary: Denies rashes, lesions, wounds, or excessive dryness of the skin   Breast: Denies pain, soreness, lumps, or discharge.   Neurological: Denies any changes in sight, smell, hearing and taste, no evidence of limb weakness.   Psychiatric: Denies depression, change in sleep patterns or anxiety.    PHYSICAL EXAMINATION:  Filed Vitals:    12/07/20 0855 12/07/20 1227 12/07/20 1334 12/07/20 1500   BP:   (!) 140/97 (!) 159/97   Pulse:   68 77   Resp: _0 Temp:    36.6 C (97.9 F)   SpO2:    100%       Intake/Output Summary (Last 24 hours) at 12/07/2020 1924  Last data filed at 12/07/2020 1600  Gross per 24  hour   Intake 960 ml   Output --   Net 960 ml     I have reviewed the vitals.  General: Pleasant, awake, alert, no acute distress.   HEENT: No scleral icterus or conjunctival injection. Bilateral TM pearly grey without erythema or bulging. No posterior oropharynx erythema or exudate. No oral ulcer or thrush.   Lymph: No anterior or posterior head or neck lymphadenopathy.   CV: RRR without murmurs.   Respiratory: Lungs CTA bilateral, anterior and posterior lung fields without wheezing or crackles.   Abdomen: No palpable hepatosplenomegaly.  Soft and nontender.  Left breast with no masses or axillary lymphadenopathy.  Right breast increase size, tenderness to palpation, no masses, skin changes consistent with [place] Donette Larry.  Extremities: Pink, intact  Neurological: CN II-XII grossly intact. AA&O x 3 without deficits. Speech clear. Upper and lower extremity strength and sensation symmetrical and intact.   Skin: Pink      Labs:     Results for orders placed or performed during the hospital encounter of 12/04/20 (from the past 24 hour(s))   CBC/DIFF    Narrative    The following orders were created for panel order CBC/DIFF.  Procedure                               Abnormality         Status                     ---------                               -----------         ------                     CBC WITH PTWS[[id number]]                Abnormal            Final result                 Please view results for these tests on the individual orders.   BASIC METABOLIC PANEL   Result Value Ref Range    SODIUM 132 (  L) 135 - 145 mmol/L    POTASSIUM 5.2 (H) 3.5 - 5.0 mmol/L    CHLORIDE 95 (L) 98 - 111 mmol/L    CO2 TOTAL 25 21 - 35 mmol/L    ANION GAP 12 mmol/L    CALCIUM 9.2 8.8 - 10.3 mg/dL    GLUCOSE 89 70 - 110 mg/dL    BUN 54 (H) 10 - 25 mg/dL    CREATININE 8.33 (H) <=1.30 mg/dL    BUN/CREA RATIO 6     ESTIMATED GFR 6 (L) Avg: 99 mL/min/1.19m2    Narrative    Estimated Glomerular Filtration Rate (eGFR) is calculated using  the CKD-EPI (2021) equation, intended for patients 120years of age and older. If gender is not documented or "unknown", there will be no eGFR calculation.   MAGNESIUM   Result Value Ref Range    MAGNESIUM 2.1 1.8 - 2.3 mg/dL   PHOSPHORUS   Result Value Ref Range    PHOSPHORUS 6.8 (H) 2.5 - 4.5 mg/dL   CBC WITH DIFF   Result Value Ref Range    WBC 13.5 (H) 3.7 - 11.0 x10^3/uL    RBC 3.09 (L) 3.85 - 5.22 x10^6/uL    HGB 9.1 (L) 11.5 - 16.0 g/dL    HCT 27.8 (L) 34.8 - 46.0 %    MCV 90.0 78.0 - 100.0 fL    MCH 29.4 26.0 - 32.0 pg    MCHC 32.7 31.0 - 35.5 g/dL    RDW-CV 17.2 (H) 11.5 - 15.5 %    PLATELETS 256 150 - 400 x10^3/uL    MPV 8.7 8.7 - 12.5 fL    NEUTROPHIL % 76 %    LYMPHOCYTE % 8 %    MONOCYTE % 14 %    EOSINOPHIL % 1 %    BASOPHIL % 0 %    NEUTROPHIL # 10.28 (H) 1.50 - 7.70 x10^3/uL    LYMPHOCYTE # 1.08 1.00 - 4.80 x10^3/uL    MONOCYTE # 1.84 (H) 0.20 - 1.10 x10^3/uL    EOSINOPHIL # 0.18 <=0.50 x10^3/uL    BASOPHIL # <0.10 <=0.20 x10^3/uL    IMMATURE GRANULOCYTE % 1 0 - 1 %    IMMATURE GRANULOCYTE # <0.10 <0.10 x10^3/uL       I have reviewed all labs.      Current Facility-Administered Medications:   .  acetaminophen (TYLENOL) tablet, 650 mg, Oral, Q6H PRN, SSharl Ma MD, 650 mg at 12/06/20 0442  .  aspirin (ECOTRIN) enteric coated tablet 81 mg, 81 mg, Oral, Daily, SSharl Ma MD, 81 mg at 12/07/20 0807  .  carvedilol (COREG) tablet, 12.5 mg, Oral, 2x/day, SSharl Ma MD, 12.5 mg at 12/07/20 0808  .  cinacalcet (SENSIPAR) tablet, 60 mg, Oral, Daily, AWarnell Forester DO, 60 mg at 12/07/20 04268 .  cloNIDine (CATAPRES) tablet, 0.1 mg, Oral, 3x/day, SSharl Ma MD, 0.1 mg at 12/07/20 1334  .  clopidogrel (PLAVIX) 75 mg tablet, 75 mg, Oral, Daily, PVenancio Poisson NP, 75 mg at 12/07/20 0807  .  cyclobenzaprine (FLEXERIL) tablet, 5 mg, Oral, Q8H PRN, SDossie Der Ammad, MD  .  diazePAM (VALIUM) tablet, 5 mg, Oral, Q6H PRN, SSharl Ma MD  .  dilTIAZem (CARDIZEM) tablet, 120 mg, Oral, 3x/day, SSharl Ma MD,  120 mg at 12/07/20 1334  .  doxazosin (CARDURA) tablet, 4 mg, Oral, QPM, SSharl Ma MD, 4 mg at 12/06/20 2032  .  furosemide (LASIX) tablet, 80 mg, Oral, 2x/day, SSharl Ma MD, 80 mg at  12/07/20 1745  .  hydrALAZINE (APRESOLINE) tablet, 100 mg, Oral, 3x/day, Sharl Ma, MD, 100 mg at 12/07/20 1335  .  HYDROmorphone (DILAUDID) 0.5 mg/0.5 mL injection, 0.4 mg, Intravenous, Q4H PRN, Sharl Ma, MD, 0.4 mg at 12/07/20 1128  .  hydrOXYzine pamoate (VISTARIL) capsule, 25 mg, Oral, HS PRN, Sharl Ma, MD, 25 mg at 12/05/20 2215  .  ipratropium-albuterol 0.5 mg-3 mg(2.5 mg base)/3 mL Solution for Nebulization, 3 mL, Nebulization, Q6H PRN, Sharl Ma, MD  .  isosorbide mononitrate (IMDUR) 24 hr extended release tablet, 60 mg, Oral, QAM, Sharl Ma, MD, 60 mg at 12/07/20 0428  .  [Held by provider] labetalol (NORMODYNE) tablet, 200 mg, Oral, 3x/day, Sharl Ma, MD  .  loratadine (CLARITIN) tablet, 10 mg, Oral, Daily, Sharl Ma, MD, 10 mg at 12/07/20 0807  .  losartan (COZAAR) tablet, 50 mg, Oral, Daily, Sharl Ma, MD, 50 mg at 12/07/20 0807  .  montelukast (SINGULAIR) 10 mg tablet, 10 mg, Oral, QPM, Sharl Ma, MD, 10 mg at 12/06/20 2032  .  nicotine (NICODERM CQ) transdermal patch (mg/24 hr), 14 mg, Transdermal, Daily, Sharl Ma, MD, 14 mg at 12/07/20 9562  .  NS flush syringe, 3 mL, Intracatheter, Q8HRS, Sharl Ma, MD, 3 mL at 12/06/20 2032  .  NS flush syringe, 3 mL, Intracatheter, Q1H PRN, Sharl Ma, MD, 3 mL at 12/04/20 2119  .  ondansetron (ZOFRAN) 2 mg/mL injection, 4 mg, Intravenous, Q6H PRN, Sharl Ma, MD, 4 mg at 12/07/20 0700  .  oxyCODONE-acetaminophen (PERCOCET) 5-341m per tablet, 1 Tablet, Oral, Q4H PRN, SSharl Ma MD, 1 Tablet at 12/07/20 1909  .  pantoprazole (PROTONIX) delayed release tablet, 40 mg, Oral, Daily, SSharl Ma MD, 40 mg at 12/07/20 0807  .  polyethylene glycol (MIRALAX) oral packet, 17 g, Oral, Daily, SSharl Ma MD, 17 g at 12/07/20 0807  .  sennosides-docusate  sodium (SENOKOT-S) 8.6-50mg per tablet, 1 Tablet, Oral, 2x/day, SSharl Ma MD, 1 Tablet at 12/07/20 [phone] .  sevelamer carbonate (RENVELA) tablet, 2,400 mg, Oral, 3x/day-Meals, AWarnell Forester DO, 2,400 mg at 12/07/20 1128  .  spironolactone (ALDACTONE) tablet, 25 mg, Oral, Daily, SSharl Ma MD, 25 mg at 12/07/20 06578   Micro: No results found for any visits on 12/04/20 (from the past 96 hour(s)).    Radiology:       ASSESSMENT & RECOMMENDATIONS:   CLucrezia Dehneis a 45y.o.female with multiple medical problems and right breast swelling.   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  However since patient has family history of breast cancer will plan for right breast skin biopsy to rule out inflammatory cancer.  Right breast ultrasound pending.  This was discussed with patient and sister at bedside.Risks, benefits, alternatives, complications explained to patient. Patient understood conversation and is in agreement with the plan.          SKarl Luke MD  [facility]    Note: A portion of this documentation was generated using MMODAL (voice recognition software) and may contain syntax/voice recognition errors.

## 2020-12-07 NOTE — Care Plan (Signed)
End of shift  Patient resting in bed with no complaints at this time. Has remained a/o and free from falls during shift. No c/o CP or dyspnea during shift. On RA. NSR on tele. Has had several BMs during shift. Medicated for pain per MAR. Call light in reach.    Kathryne Gin, RN      This patient currently meets requirements for low to mid level nursing care. The patient will continue to be monitored and re-evaluated for any changes in condition.

## 2020-12-07 NOTE — Care Plan (Signed)
Patient is AxOx4, NSR, RA. Patient is independent. Patient did not have dialysis today. Patient in pain, PRN meds given. Patient awaiting general sx consult. Right breast swelling still significant, into right armpit. No acute events to be noted. Will continue to monitor.     Savina Olshefski M. Jamal Collin, RN  12/07/2020, 18:20     Problem: Pain Acute  Goal: Optimal Pain Control and Function  Outcome: Ongoing (see interventions/notes)  Intervention: Optimize Psychosocial Wellbeing  Recent Flowsheet Documentation  Taken 12/07/2020 0800 by Micah Flesher., RN  Diversional Activities: television  Supportive Measures: active listening utilized     Problem: Hemodialysis  Goal: Safe, Effective Therapy Delivery  Outcome: Ongoing (see interventions/notes)  Goal: Effective Tissue Perfusion  Outcome: Ongoing (see interventions/notes)  Goal: Absence of Infection Signs and Symptoms  Outcome: Ongoing (see interventions/notes)  Intervention: Prevent or Manage Infection  Recent Flowsheet Documentation  Taken 12/07/2020 0800 by Micah Flesher., RN  Fever Reduction/Comfort Measures:   lightweight bedding   lightweight clothing  Infection Prevention: promote handwashing     Problem: Anxiety  Goal: Anxiety Reduction or Resolution  Outcome: Ongoing (see interventions/notes)  Intervention: Promote Anxiety Reduction  Recent Flowsheet Documentation  Taken 12/07/2020 0800 by Micah Flesher., RN  Family/Support System Care: support provided  Supportive Measures: active listening utilized

## 2020-12-07 NOTE — Consults (Signed)
[facility]  Vascular Surgery Consult      [facility]  Follow Up Note          [name], [name], 45 y.o. female  Date of Service: 12/07/2020  Date of Birth:  1976/02/18    Hospital Day:  LOS: 3 days     Chief Complaint:      Assessment/Recommendations:  Assessment:  Right SVC syndrome   ? Right breast malignancy  Status post  Right upper extremity venogram  right subclavian vein innominate vein and superior vena cava venogram  balloon angioplasty right innominate vein  ultrasound-guided right brachial vein access 12/05/2020    Plan:  Continue Plavix   Follow up as outpatient   Awaiting general surgery consult  Waiting for right breast ultrasound      Subjective     swelling improved to the right face arm and flank       Objective   Noted improved of overall right side swelling  Swelling right breast    Temperature: 36.9 C (98.4 F)  Heart Rate: 68  BP (Non-Invasive): (!) 140/97  Respiratory Rate: 17  SpO2: 100 %      Labs:    I have reviewed all lab results.  Lab Results Today:    Results for orders placed or performed during the hospital encounter of 12/04/20 (from the past 24 hour(s))   BASIC METABOLIC PANEL   Result Value Ref Range    SODIUM 132 (L) 135 - 145 mmol/L    POTASSIUM 5.2 (H) 3.5 - 5.0 mmol/L    CHLORIDE 95 (L) 98 - 111 mmol/L    CO2 TOTAL 25 21 - 35 mmol/L    ANION GAP 12 mmol/L    CALCIUM 9.2 8.8 - 10.3 mg/dL    GLUCOSE 89 70 - 110 mg/dL    BUN 54 (H) 10 - 25 mg/dL    CREATININE 8.33 (H) <=1.30 mg/dL    BUN/CREA RATIO 6     ESTIMATED GFR 6 (L) Avg: 99 mL/min/1.66m   MAGNESIUM   Result Value Ref Range    MAGNESIUM 2.1 1.8 - 2.3 mg/dL   PHOSPHORUS   Result Value Ref Range    PHOSPHORUS 6.8 (H) 2.5 - 4.5 mg/dL   CBC WITH DIFF   Result Value Ref Range    WBC 13.5 (H) 3.7 - 11.0 x103/uL    RBC 3.09 (L) 3.85 - 5.22 x106/uL    HGB 9.1 (L) 11.5 - 16.0 g/dL    HCT 27.8 (L) 34.8 - 46.0 %    MCV 90.0 78.0 - 100.0 fL    MCH 29.4 26.0 - 32.0 pg    MCHC 32.7 31.0 - 35.5 g/dL    RDW-CV 17.2  (H) 11.5 - 15.5 %    PLATELETS 256 150 - 400 x103/uL    MPV 8.7 8.7 - 12.5 fL    NEUTROPHIL % 76 %    LYMPHOCYTE % 8 %    MONOCYTE % 14 %    EOSINOPHIL % 1 %    BASOPHIL % 0 %    NEUTROPHIL # 10.28 (H) 1.50 - 7.70 x103/uL    LYMPHOCYTE # 1.08 1.00 - 4.80 x103/uL    MONOCYTE # 1.84 (H) 0.20 - 1.10 x103/uL    EOSINOPHIL # 0.18 <=0.50 x103/uL    BASOPHIL # <0.10 <=0.20 x103/uL    IMMATURE GRANULOCYTE % 1 0 - 1 %    IMMATURE GRANULOCYTE # <0.10 <0.10 x103/uL  Imaging Studies:  N/A      Case was discussed with and reviewed with Dr. Kallie Edward.  Venancio Poisson FNP-c    Nurse Practitioner   [facility] [place]  [phone] (on call physician after 5pm)  [place]        This note was partially generated using MModal Fluency Direct system, and there may be some incorrect words, spellings, and punctuation that were not noted in checking the note before saving.

## 2020-12-07 NOTE — Consults (Signed)
Nephrology Consult follow-up:  Date of service: 12/07/2020  Hospital Day:  LOS: 3 days     Assessment:  Patient is a 67 YOF with a PMH of CHF, HTN, HLD, COPD and ESRD on HD who presents with concern for SVC syndrome. Nephrology consulted for ESRD Management.      ESRD on HD via R IJ TDC   Normocytic Anemia  Leukocytosis  Chronic Asymptomatic Hypervolemic Hyponatremia  Azotemia  Secondary Hyperparathyroidism  Hyperphosphatemia  ? Electrolytes: Na 132, K 5.2, Cl 95  ? Acid/base imbalance: Bicarb 25, AG 12, no BG for complete assessment  ? BMD: Ca 9.2 (uncorrected for albumin of 3.8), Phos 6.8, Mg 2.1, iPTH: 748.1, Vit D 25: < 7  ? Anemia: Hgb 9.1      Recommendations:  - iHD today:             - 3 Hr, 350 BFR, 800 DFR, 2K/2.5 Ca bath, 2.0 L UF as hemodynamics tolerate  - Renvela 2400 mg TID w/ meals   - Sensipar 60 mg daily   - Strict I&O's with daily standing weights  - BMP, Mg, Phos on MWF while admitted   - Nephrology will continue to follow, please page with any inquiries         Subjective: patient resting upon initial assessment, easily awakened with verbal cues. Agreeable to iHD today.     Physical Exam:  BP (!) 159/97   Pulse 83   Temp 36.9 C (98.4 F)   Resp 18   Ht 1.702 m ('5\' 7"'$ )   Wt 104 kg (228 lb 6.4 oz)   SpO2 100%   BMI 35.77 kg/m   General: laying supine in bed, vital signs reviewed   HEENT: MMM, no thrush, EOMI, sclera non-icteric   Neck: supple, symmetrical, trachea appears midline   Lungs: clear to auscultation b/l, no crackles, no stridor  Cardiovascular: no rub, no gallop, 1+ b/l LE edema  Abdomen: soft, non tender, non distended  Extremities: No cyanosis, grossly intact   Skin: Skin warm and dry, no rash    Neurologic: No tremor, No asterixis   Access: R IJ TDC     IO  10/13 0700 - 10/14 0659  In: 1060 [P.O.:1060]  Out: 3800 [Dialysis:3800]     I have reviewed the current medications     Labs: I have reviewed the labs       Nita Sells, [place]  Department of  Nephrology

## 2020-12-08 ENCOUNTER — Encounter (HOSPITAL_COMMUNITY)
Admission: AD | Disposition: A | Discharge status: Home / Self Care | Payer: Self-pay | Source: Other Acute Inpatient Hospital | Attending: Internal Medicine

## 2020-12-08 ENCOUNTER — Encounter (HOSPITAL_COMMUNITY): Payer: Self-pay | Admitting: Hospitalist

## 2020-12-08 ENCOUNTER — Inpatient Hospital Stay (HOSPITAL_COMMUNITY): Payer: Commercial Managed Care - PPO | Admitting: Certified Registered"

## 2020-12-08 DIAGNOSIS — R609 Edema, unspecified: Secondary | ICD-10-CM

## 2020-12-08 DIAGNOSIS — Z803 Family history of malignant neoplasm of breast: Secondary | ICD-10-CM

## 2020-12-08 DIAGNOSIS — Z9889 Other specified postprocedural states: Secondary | ICD-10-CM

## 2020-12-08 LAB — BASIC METABOLIC PANEL
ANION GAP: 0 mmol/L
BUN/CREA RATIO: 5
BUN: 35 mg/dL — ABNORMAL HIGH (ref 10–25)
CALCIUM: 8.4 mg/dL — ABNORMAL LOW (ref 8.8–10.3)
CHLORIDE: 101 mmol/L (ref 98–111)
CO2 TOTAL: 28 mmol/L (ref 21–35)
CREATININE: 6.8 mg/dL — ABNORMAL HIGH (ref <=1.30)
ESTIMATED GFR: 7 mL/min/{1.73_m2} — ABNORMAL LOW
GLUCOSE: 82 mg/dL (ref 70–110)
POTASSIUM: 4.7 mmol/L (ref 3.5–5.0)
SODIUM: 129 mmol/L — ABNORMAL LOW (ref 135–145)

## 2020-12-08 LAB — POC BLOOD GLUCOSE (RESULTS): GLUCOSE, POC: 91 mg/dl (ref 70–110)

## 2020-12-08 LAB — CBC WITH DIFF
BASOPHIL #: <0.1 10*3/uL (ref <=0.20)
BASOPHIL %: 0 %
EOSINOPHIL #: 0.3 10*3/uL (ref <=0.50)
EOSINOPHIL %: 3 %
HCT: 27.5 % — ABNORMAL LOW (ref 34.8–46.0)
HGB: 8.7 g/dL — ABNORMAL LOW (ref 11.5–16.0)
IMMATURE GRANULOCYTE #: <0.1 10*3/uL (ref <0.10)
IMMATURE GRANULOCYTE %: 1 % (ref 0–1)
LYMPHOCYTE #: 0.93 10*3/uL — ABNORMAL LOW (ref 1.00–4.80)
LYMPHOCYTE %: 9 %
MCH: 29.2 pg (ref 26.0–32.0)
MCHC: 31.6 g/dL (ref 31.0–35.5)
MCV: 92.3 fL (ref 78.0–100.0)
MONOCYTE #: 1.39 10*3/uL — ABNORMAL HIGH (ref 0.20–1.10)
MONOCYTE %: 13 %
MPV: 9.4 fL (ref 8.7–12.5)
NEUTROPHIL #: 8.31 10*3/uL — ABNORMAL HIGH (ref 1.50–7.70)
NEUTROPHIL %: 74 %
PLATELETS: 251 10*3/uL (ref 150–400)
RBC: 2.98 10*6/uL — ABNORMAL LOW (ref 3.85–5.22)
RDW-CV: 17.3 % — ABNORMAL HIGH (ref 11.5–15.5)
WBC: 11 10*3/uL (ref 3.7–11.0)

## 2020-12-08 LAB — ECG 12 LEAD - ADULT
Atrial Rate: 84 {beats}/min
Calculated P Axis: 68 degrees
Calculated R Axis: 14 degrees
Calculated T Axis: 102 degrees
PR Interval: 218 ms
QRS Duration: 100 ms
QT Interval: 432 ms
QTC Calculation: 510 ms
Ventricular rate: 84 {beats}/min

## 2020-12-08 LAB — PHOSPHORUS: PHOSPHORUS: 5.4 mg/dL — ABNORMAL HIGH (ref 2.5–4.5)

## 2020-12-08 LAB — MAGNESIUM: MAGNESIUM: 2.1 mg/dL (ref 1.8–2.3)

## 2020-12-08 SURGERY — BIOPSY BREAST INCISIONAL
Anesthesia: General | Laterality: Right | Wound class: Clean Wound: Uninfected operative wounds in which no inflammation occurred

## 2020-12-08 MED ORDER — DEXAMETHASONE SODIUM PHOSPHATE 4 MG/ML INJECTION SOLUTION
Freq: Once | INTRAMUSCULAR | Status: DC | PRN
Start: 2020-12-08 — End: 2020-12-08
  Administered 2020-12-08: 4 mg via INTRAVENOUS

## 2020-12-08 MED ORDER — ERGOCALCIFEROL (VITAMIN D2) 1,250 MCG (50,000 UNIT) CAPSULE
50000.0000 [IU] | ORAL_CAPSULE | ORAL | Status: DC
Start: 2020-12-08 — End: 2020-12-08

## 2020-12-08 MED ORDER — MIDAZOLAM 1 MG/ML INJECTION WRAPPER
Freq: Once | INTRAMUSCULAR | Status: DC | PRN
Start: 2020-12-08 — End: 2020-12-08
  Administered 2020-12-08: 2 mg via INTRAVENOUS

## 2020-12-08 MED ORDER — FENTANYL (PF) 50 MCG/ML INJECTION SOLUTION
Freq: Once | INTRAMUSCULAR | Status: DC | PRN
Start: 2020-12-08 — End: 2020-12-08
  Administered 2020-12-08 (×2): 50 ug via INTRAVENOUS

## 2020-12-08 MED ORDER — ONDANSETRON HCL (PF) 4 MG/2 ML INJECTION SOLUTION
4.0000 mg | Freq: Once | INTRAMUSCULAR | Status: DC | PRN
Start: 2020-12-08 — End: 2020-12-08

## 2020-12-08 MED ORDER — CEFAZOLIN 2GM IN D5W 50ML MINIBAG IVPB
Freq: Once | INTRAVENOUS | Status: DC | PRN
Start: 2020-12-08 — End: 2020-12-08
  Administered 2020-12-08: 2000 mg via INTRAVENOUS

## 2020-12-08 MED ORDER — ERGOCALCIFEROL (VITAMIN D2) 1,250 MCG (50,000 UNIT) CAPSULE
50000.0000 [IU] | ORAL_CAPSULE | ORAL | Status: DC
Start: 2020-12-08 — End: 2020-12-14
  Administered 2020-12-08: 18:00:00 50000 [IU] via ORAL
  Filled 2020-12-08: qty 1

## 2020-12-08 MED ORDER — PROPOFOL 10 MG/ML IV BOLUS
INJECTION | Freq: Once | INTRAVENOUS | Status: DC | PRN
Start: 2020-12-08 — End: 2020-12-08
  Administered 2020-12-08: 150 mg via INTRAVENOUS

## 2020-12-08 MED ORDER — HYDROMORPHONE (PF) 0.5 MG/0.5 ML INJECTION SYRINGE
0.5000 mg | INJECTION | INTRAMUSCULAR | Status: DC | PRN
Start: 2020-12-08 — End: 2020-12-08

## 2020-12-08 MED ORDER — SODIUM CHLORIDE 0.9 % INTRAVENOUS SOLUTION
INTRAVENOUS | Status: DC
Start: 2020-12-08 — End: 2020-12-08

## 2020-12-08 MED ORDER — ONDANSETRON HCL (PF) 4 MG/2 ML INJECTION SOLUTION
Freq: Once | INTRAMUSCULAR | Status: DC | PRN
Start: 2020-12-08 — End: 2020-12-08
  Administered 2020-12-08: 4 mg via INTRAVENOUS

## 2020-12-08 MED ORDER — HYDRALAZINE 20 MG/ML INJECTION SOLUTION
5.0000 mg | Freq: Once | INTRAMUSCULAR | Status: DC | PRN
Start: 2020-12-08 — End: 2020-12-08

## 2020-12-08 MED ORDER — LABETALOL 5 MG/ML INTRAVENOUS SOLUTION
5.0000 mg | INTRAVENOUS | Status: DC | PRN
Start: 2020-12-08 — End: 2020-12-08

## 2020-12-08 MED ORDER — SODIUM CHLORIDE 0.9 % INTRAVENOUS SOLUTION
INTRAVENOUS | Status: DC | PRN
Start: 2020-12-08 — End: 2020-12-08

## 2020-12-08 MED ORDER — MEPERIDINE (PF) 50 MG/ML INJECTION SOLUTION
12.5000 mg | INTRAMUSCULAR | Status: DC | PRN
Start: 2020-12-08 — End: 2020-12-08

## 2020-12-08 MED ORDER — HYDRALAZINE 20 MG/ML INJECTION SOLUTION
Freq: Once | INTRAMUSCULAR | Status: DC | PRN
Start: 2020-12-08 — End: 2020-12-08
  Administered 2020-12-08: 5 mg via INTRAVENOUS

## 2020-12-08 MED ORDER — PROCHLORPERAZINE EDISYLATE 10 MG/2 ML (5 MG/ML) INJECTION SOLUTION
5.0000 mg | Freq: Once | INTRAMUSCULAR | Status: DC | PRN
Start: 2020-12-08 — End: 2020-12-08

## 2020-12-08 MED ORDER — BUPIVACAINE HCL 0.25 % (2.5 MG/ML) INJECTION SOLUTION
Freq: Once | INTRAMUSCULAR | Status: DC | PRN
Start: 2020-12-08 — End: 2020-12-08
  Administered 2020-12-08: 20 mL via INTRAPLEURAL

## 2020-12-08 MED ORDER — LACTATED RINGERS INTRAVENOUS SOLUTION
INTRAVENOUS | Status: DC
Start: 2020-12-08 — End: 2020-12-08

## 2020-12-08 MED ORDER — LIDOCAINE (PF) 100 MG/5 ML (2 %) INTRAVENOUS SYRINGE
INJECTION | Freq: Once | INTRAVENOUS | Status: DC | PRN
Start: 2020-12-08 — End: 2020-12-08
  Administered 2020-12-08: 25 mg via INTRAVENOUS

## 2020-12-08 MED ORDER — FENTANYL (PF) 50 MCG/ML INJECTION SOLUTION
25.0000 ug | INTRAMUSCULAR | Status: DC | PRN
Start: 2020-12-08 — End: 2020-12-08

## 2020-12-08 MED ORDER — SODIUM CHLORIDE 0.9 % IRRIGATION SOLUTION
Freq: Once | Status: DC | PRN
Start: 2020-12-08 — End: 2020-12-08
  Administered 2020-12-08: 1000 mL

## 2020-12-08 MED ORDER — ALBUTEROL SULFATE HFA 90 MCG/ACTUATION AEROSOL INHALATION - EAST
INHALATION_SPRAY | Freq: Once | RESPIRATORY_TRACT | Status: DC | PRN
Start: 2020-12-08 — End: 2020-12-08
  Administered 2020-12-08: 2 via RESPIRATORY_TRACT

## 2020-12-08 MED ORDER — METHYLENE BLUE (ANTIDOTE) 5 MG/ML INTRAVENOUS SOLUTION
INTRAVENOUS | Status: AC
Start: 2020-12-08 — End: 2020-12-08
  Filled 2020-12-08: qty 10

## 2020-12-08 MED ORDER — ALBUTEROL SULFATE HFA 90 MCG/ACTUATION AEROSOL INHALATION - EAST
INHALATION_SPRAY | RESPIRATORY_TRACT | Status: AC
Start: 2020-12-08 — End: 2020-12-08
  Filled 2020-12-08: qty 8.5

## 2020-12-08 MED ORDER — BUPIVACAINE (PF) 0.25 % (2.5 MG/ML) INJECTION SOLUTION
INTRAMUSCULAR | Status: AC
Start: 2020-12-08 — End: 2020-12-08
  Filled 2020-12-08: qty 30

## 2020-12-08 MED ORDER — CEFAZOLIN 1 GRAM SOLUTION FOR INJECTION
INTRAMUSCULAR | Status: AC
Start: 2020-12-08 — End: 2020-12-08
  Filled 2020-12-08: qty 10

## 2020-12-08 SURGICAL SUPPLY — 58 items
ADHESIVE TISSUE EXOFIN 1.0ML_PREMIERPRO EXOFIN (SUTURE/WOUND CLOSURE) ×1
APPL 70% ISPRP 2% CHG 26ML 13._2X13.2IN CHLRPRP PREP DEHP-FR (MED SURG SUPPLIES) ×2
APPL 70% ISPRP 2% CHG 26ML CHLRPRP HI-LT ORNG PREP STRL LF  DISP CLR (MED SURG SUPPLIES) ×1 IMPLANT
BLADE 15 BD RB-BCK CBNSTL SURG TISS STRL LF  DISP (SURGICAL CUTTING SUPPLIES) IMPLANT
BLADE 15 BD RB-BCK CBNSTL SURG_STRL LF (CUTTING ELEMENTS)
CLOSURE SKIN STRIPS 1/4X4IN_R1546 10/PK 50PK/BX (WOUND CARE/ENTEROSTOMAL SUPPLY) ×1
CONV USE ITEM 156524 - ADHESIVE TISSUE EXOFIN 1.0ML_PREMIERPRO EXOFIN (SUTURE/WOUND CLOSURE) ×1 IMPLANT
CONV USE ITEM 307057 - PACK COLD INST DISP 6X6IN_24EA/CS (MED SURG SUPPLIES) ×1 IMPLANT
CONV USE ITEM 308892 - GLOVE SURG 7 LF PF BEAD CUF SMTH STRL BRN TINT 12IN (GLOVES AND ACCESSORIES)
CONV USE ITEM 321833 - GLOVE SURG 7 LF PF BEAD CUF SMTH STRL BRN TINT 12IN (GLOVES AND ACCESSORIES) IMPLANT
CONV USE ITEM 337880 - TRAY SURG CSTM MIN DISP STRL LF (CUSTOM TRAYS & PACK) ×1
CONV USE ITEM 344616 - PACK COLD INST DISP 6X6IN_24EA/CS (MED SURG SUPPLIES) ×1 IMPLANT
DISC USE ITEM 163322 - SYRINGE LL 20ML LTX STRL MED (MED SURG SUPPLIES) ×1 IMPLANT
DISCONTINUED NO SUB - SLEEVE MAP 10MM GAM FND PROTECT STRL (MED SURG SUPPLIES) IMPLANT
DISCONTINUED USE ITEM 102436 - NEEDLE HYPO 22GA 1.5IN REG WL_BD POLYPROP REG BVL LL HUB (MED SURG SUPPLIES) IMPLANT
DISCONTINUED USE ITEM 97903 - SUTURE 3-0 SH VICRYL 27IN UNDYED BRD COAT ABS (SUTURE/WOUND CLOSURE) ×1 IMPLANT
DRAPE FENESTRATE ABS REINF CONTOUR ARMBRD CVR PAD 121X102X78IN BRST CHST 28IN 28IN PRXM LF  STRL (DRAPE/PACKS/SHEETS/OR TOWEL) ×1 IMPLANT
DRAPE FENESTRATE ABS REINF CON_TOUR ARMBRD CVR PAD 121X102X78 (DRAPE/PACKS/SHEETS/OR TOWEL) ×1
DRAPE MAG 20X16IN DVN FOAM FLXB NEUT ZN LF  DISP (DRAPE/PACKS/SHEETS/OR TOWEL) IMPLANT
DRAPE MAG 20X16IN DVN FOAM FLX_B NEUT ZN LF DISP (DRAPE/PACKS/SHEETS/OR TOWEL)
ELECTRODE ESURG BLADE 2.75IN 3/32IN EDGE STRL .2IN DISP INSL STD SHAFT HEX LOCK LF (SURGICAL CUTTING SUPPLIES) ×1 IMPLANT
ELECTRODE ESURG BLADE STD 2.75_IN 3/32IN STRL EDGE .2IN DISP (CUTTING ELEMENTS) ×2
ELECTRODE ESURG NEEDLE 2.8IN 3/32IN EDGE STRL 1.1IN DISP STD SHAFT LF (SURGICAL CUTTING SUPPLIES) IMPLANT
ELECTRODE ESURG NEEDLE 90D 2.8_IN 3/32IN EDGE STRL 1.1IN DISP (CUTTING ELEMENTS)
ELECTRODE PATIENT RTN 9FT VLAB C30- LB RM PHSV ACRL FOAM CORD NONIRRITATE NONSENSITIZE ADH STRP (SURGICAL CUTTING SUPPLIES) ×1 IMPLANT
ELECTRODE PATIENT RTN 9FT VLAB_REM C30- LB PLHSV ACRL FOAM (CUTTING ELEMENTS) ×1
GARMENT COMPRESS MED CALF CENTAURA NYL VASOGRAD LTWT BRTHBL SEQ FIL BLU 18- IN (MED SURG SUPPLIES) ×1 IMPLANT
GARMENT COMPRESS MED CALF CENT_AURA NYL VASOGRAD LTWT BRTHBL (MED SURG SUPPLIES) ×1
INSTRUMENT OR DRAIN CART (INSTRUMENTS)
INSTRUMENT OR DRAIN CART (SURGICAL INSTRUMENTS) IMPLANT
LABEL STRL GENERAL_C3333UHCG 100SH/BX (MED SURG SUPPLIES) IMPLANT
NEEDLE HYPO  18GA 1.5IN REG WL SFGLD POLYPROP REG BVL LL SHIELD MECH DEHP-FR IM PNK STRL LF  DISP (MED SURG SUPPLIES) IMPLANT
NEEDLE HYPO 18GA 1.5IN REG WL_SFGLD POLYPROP LL HUB SHIELD (MED SURG SUPPLIES)
NEEDLE HYPO 22GA 1.5IN REG WL_BD POLYPROP REG BVL LL HUB (MED SURG SUPPLIES)
PACK COLD INST DISP 6X6IN_24EA/CS (MED SURG SUPPLIES) ×1
PACK MINOR - ~~LOC~~ (CUSTOM TRAYS & PACK) ×1 IMPLANT
PENCIL SMOKEVAC COAT PSHBTN LF (MED SURG SUPPLIES) ×2 IMPLANT
SLEEVE MAP 10MM GAM FND PROTECT STRL (MED SURG SUPPLIES) IMPLANT
SLEEVE STERILE GAMMA FINDER_GMSLV00110 CS/10 (MED SURG SUPPLIES)
SPONGE GAUZE 4X4IN MDCHC COTTON 16 PLY USP TY VII LF  STRL DISP (WOUND CARE SUPPLY) ×1 IMPLANT
SPONGE GAUZE 4X4IN MDCHC COTTO_N 16 PLY USP TY VII LF STRL (WOUND CARE/ENTEROSTOMAL SUPPLY) ×1
STRIP 4X.25IN STRSTRP PLSTR REINF SKNCLS WHT STRL LF (WOUND CARE SUPPLY) ×1 IMPLANT
SUTURE 2-0 SH VICRYL+ 27IN UNDYED BRD ANBCTRL COAT ABS (SUTURE/WOUND CLOSURE) ×1 IMPLANT
SUTURE 2-0 SH VICRYL+ 27IN UND_YED BRD COAT ABS (SUTURE/WOUND CLOSURE) ×1
SUTURE 3-0 SH VICRYL 27IN UNDYED BRD COAT ABS (SUTURE/WOUND CLOSURE) ×1
SUTURE 3-0 SH VICRYL 27IN UNDY_ED BRD COAT ABS (SUTURE/WOUND CLOSURE) ×1
SUTURE 4-0 PS2 MONOCRYL MTPS 27IN UNDYED MONOF ABS (SUTURE/WOUND CLOSURE) ×1 IMPLANT
SUTURE 4-0 PS2 MONOCRYL MTPS 2_7IN UNDYED MONOF ABS (SUTURE/WOUND CLOSURE) ×1
SUTURE SILK 2-0 SH PERMAHAND 30IN BLK BRD NONAB (SUTURE/WOUND CLOSURE) ×1 IMPLANT
SUTURE SILK 2-0 SH PERMAHAND 3_0IN BLK BRD NONAB (SUTURE/WOUND CLOSURE) ×1
SYRINGE 2OZ LF STRL SFT EAR P_VC DISP BLU (MED SURG SUPPLIES) ×1
SYRINGE 2OZ STRL BULB PEEL PCH EAR ULCR (MED SURG SUPPLIES) ×1 IMPLANT
SYRINGE LL 20ML LTX STRL MED (MED SURG SUPPLIES) ×1
TRAY SURG CSTM MIN DISP STRL L_F (CUSTOM TRAYS & PACK) ×1
VEST 3 FRN ZIP ADJ SHLDR STRAP HOOK MACH WSHBL POST SURG JOBST VELCRO NYL FBRC LF  39 1/8-43IN WHT (WOUND CARE SUPPLY) IMPLANT
VEST 4 FRN ZIP ADJ SHLDR STRAP HOOK MACH WSHBL POST SURG JOBST VELCRO NYL FBRC LF  43 1/8-47IN WHT (WOUND CARE SUPPLY) IMPLANT
VEST SURG JOBST SZ3 WO/CUPS_111908 (WOUND CARE/ENTEROSTOMAL SUPPLY)
VEST SURG JOBST SZ4 WO/CUPS_111909 (WOUND CARE/ENTEROSTOMAL SUPPLY)

## 2020-12-08 NOTE — Nurses Notes (Signed)
Patient remains alert and oriented. Breast biopsy performed today, patient tolerated fairly well. Complains of pain to right breast, relieved with PRN medication. Blood pressures remain elevated. Independently ambulates. Denies any questions or concerns at this time. Remains NSR on telemetry. Dustin Flock, RN  12/08/2020, 19:04

## 2020-12-08 NOTE — Anesthesia Preprocedure Evaluation (Addendum)
ANESTHESIA PRE-OP EVALUATION  Planned Procedure: BIOPSY BREAST INCISIONAL (Right)  Review of Systems         patient summary reviewed  nursing notes reviewed        Pulmonary   COPD, asthma, sleep apnea and current smoker,   Cardiovascular    Hypertension, CHF, ECG reviewed and 2022 EKG  Normal sinus rhythm  Minimal voltage criteria for LVH, may be normal variant ( Cornell product )  ST & T wave abnormality, consider lateral ischemia  Abnormal ECG ,No peripheral edema,        GI/Hepatic/Renal    GERD, renal insufficiency and end-stage renal disease    dialysis treatment      Endo/Other    anemia and obesity,      Neuro/Psych/MS        Cancer                      Physical Assessment      Airway       Mallampati: II    TM distance: >3 FB    Neck ROM: full  Mouth Opening: good.            Dental                      Pulmonary      (+) wheezes present        Cardiovascular    Rhythm: regular  Rate: Normal  (-) no friction rub, carotid bruit is not present, no peripheral edema and no murmur     Other findings            Plan  ASA 3 - emergent     Planned anesthesia type: general     general anesthesia with laryngeal mask airway                  Intravenous induction     Anesthesia issues/risks discussed are: Dental Injuries, PONV, Post-op Pain Management, Post-op Cognitive Dysfunction, Aspiration, Intraoperative Awareness/ Recall, Stroke, Cardiac Events/MI, Post-op Agitation/Tantrum, Blood Loss, Difficult Airway and Sore Throat.  Anesthetic plan and risks discussed with patient             Patient's NPO status is appropriate for Anesthesia.           Plan discussed with CRNA.

## 2020-12-08 NOTE — Care Plan (Signed)
End of shift  Patient resting in bed with no complaints at this time. Has remained a/o and free from falls during shift. No c/o CP or dyspnea during shift. On RA. NSR on tele. Medicated for pain per MAR. NPO since midnight for breast biopsy. Ambulated unit during shift. Call light in reach.  Kathryne Gin, RN    This patient currently meets requirements for low to mid level nursing care. The patient will continue to be monitored and re-evaluated for any changes in condition.

## 2020-12-08 NOTE — Progress Notes (Signed)
[facility]  Hospitalist Progress Note    [name]       45 y.o.       Date of service: 12/08/2020  Date of Admission:  12/04/2020    Hospital Day:  LOS: 4 days   CC: SVC syndrome    Subjective:  Patient was seen and evaluated at the bedside in no acute cardiopulmonary distress.  She was waiting to go to OR for breast biopsy.  She still waiting for the ultrasound to be done.  She asked we discontinue heparin for VTE prophylaxis as this causes her to bleed, risk of VTE was explained and patient wants SCDs only.  She had no other complaints.      Objective:   Filed Vitals:    12/08/20 1330 12/08/20 1345 12/08/20 1400 12/08/20 1405   BP: (!) 175/115 (!) 159/104 (!) 169/106    Pulse: 71 78 69    Resp: '18 16 14    '$ Temp:       SpO2: 100% 98% 98% 99%     O2 delivery: None (Room Air)     I have reviewed the vitals.      Input/Output    Intake/Output Summary (Last 24 hours) at 12/08/2020 1447  Last data filed at 12/08/2020 1313  Gross per 24 hour   Intake 550 ml   Output 2300 ml   Net -1750 ml         acetaminophen (TYLENOL) tablet, 650 mg, Oral, Q6H PRN  aspirin (ECOTRIN) enteric coated tablet 81 mg, 81 mg, Oral, Daily  carvedilol (COREG) tablet, 12.5 mg, Oral, 2x/day  cinacalcet (SENSIPAR) tablet, 60 mg, Oral, Daily  cloNIDine (CATAPRES) tablet, 0.1 mg, Oral, 3x/day  clopidogrel (PLAVIX) 75 mg tablet, 75 mg, Oral, Daily  cyclobenzaprine (FLEXERIL) tablet, 5 mg, Oral, Q8H PRN  diazePAM (VALIUM) tablet, 5 mg, Oral, Q6H PRN  dilTIAZem (CARDIZEM) tablet, 120 mg, Oral, 3x/day  doxazosin (CARDURA) tablet, 4 mg, Oral, QPM  furosemide (LASIX) tablet, 80 mg, Oral, 2x/day  hydrALAZINE (APRESOLINE) tablet, 100 mg, Oral, 3x/day  HYDROmorphone (DILAUDID) 0.5 mg/0.5 mL injection, 0.4 mg, Intravenous, Q4H PRN  hydrOXYzine pamoate (VISTARIL) capsule, 25 mg, Oral, HS PRN  ipratropium-albuterol 0.5 mg-3 mg(2.5 mg base)/3 mL Solution for Nebulization, 3 mL, Nebulization, Q6H PRN  isosorbide mononitrate (IMDUR) 24 hr extended  release tablet, 60 mg, Oral, QAM  [Held by provider] labetalol (NORMODYNE) tablet, 200 mg, Oral, 3x/day  loratadine (CLARITIN) tablet, 10 mg, Oral, Daily  losartan (COZAAR) tablet, 50 mg, Oral, Daily  montelukast (SINGULAIR) 10 mg tablet, 10 mg, Oral, QPM  nicotine (NICODERM CQ) transdermal patch (mg/24 hr), 14 mg, Transdermal, Daily  NS flush syringe, 3 mL, Intracatheter, Q8HRS  NS flush syringe, 3 mL, Intracatheter, Q1H PRN  ondansetron (ZOFRAN) 2 mg/mL injection, 4 mg, Intravenous, Q6H PRN  oxyCODONE-acetaminophen (PERCOCET) 5-'325mg'$  per tablet, 1 Tablet, Oral, Q4H PRN  pantoprazole (PROTONIX) delayed release tablet, 40 mg, Oral, Daily  polyethylene glycol (MIRALAX) oral packet, 17 g, Oral, Daily  sennosides-docusate sodium (SENOKOT-S) 8.6-'50mg'$  per tablet, 1 Tablet, Oral, 2x/day  sevelamer carbonate (RENVELA) tablet, 2,400 mg, Oral, 3x/day-Meals  spironolactone (ALDACTONE) tablet, 25 mg, Oral, Daily        Physical Exam:  General: No acute distress, In bed  HEENT: R facial and neck swelling  Cardiac: RRR  Chest: R breast swelling. Permcath to R chest wall  Respiratory: CTA  Abdomen: Distended along R abdomen.   Extremities: R arm > L arm. 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     CBC (Last 24 Hours):    Recent Results last 24 hours     12/08/20  0644   WBC 11.0   HGB 8.7*   HCT 27.5*   MCV 92.3   PLTCNT 251         BMP (Last 24 Hours):    Recent Results last 24 hours     12/08/20  0644   SODIUM 129*   POTASSIUM 4.7   CHLORIDE 101   CO2 28   BUN 35*   CREATININE 6.80*   CALCIUM 8.4*   GLUCOSENF 82           I have reviewed all labs.    Micro: No results found for any visits on 12/04/20 (from the past 96 hour(s)).    Radiology:         PT/OT: No    Consults: Vascular Surgery, Nephrology, Gen Surgery    Assessment/ Plan:   Active Hospital Problems    Diagnosis   . Primary Problem: SVC syndrome   . ESRD (end stage renal disease) (CMS [place])   . Uncontrolled hypertension   . Tobacco dependence   . GERD (gastroesophageal  reflux disease)   . Insomnia     Possible SVC Syndrome  - Outside CTA w/ concern for moderate narrowing of the distal R subclavian vein at its junction  - Had significant R sided swelling  - Vascular Surgery consult appreciated  - s/p Right venogram and balloon angioplasty of R innominate vein. Has some back pain post procedure but otherwise tolerated well.   -swelling improved however breast continues to be swollen  - continue HD and fluid removal to see if swelling subsides.   - Gen Surg consult for breast swelling.   -underwent breast biopsy 12/08/2020  -right breast ultrasound ordered    Resistant hypertension  - Reports difficult to control hypertension and on multiple anti-hypertensive including clonidine PO and patch, coreg, labetalol, Cardizem, Cardura, lasix, Imdur, losartan and aldactone. Patient has all her bottles in the room with her.   - Can continue home meds for now and adjust as needed. Will appreciate nephro assistance in adjusting  - Monitor BP  - Telemetry    Leukocytosis  - Completed 7 days of antibiotics at outside hospital  - Continue to monitor and hold off on Abx.   - Suspecting reactive    ESRD  Hyponatremia  - Diagnosed in 2010. Permcath placed 2021  - Nephro consult for HD  - Continue cinacalcet + renelva    Anemia  - Hb stable, likely 2/2 to CKD    GERD  - Continue PPI    Tobacco dependence  - Nicotine patch    Asthma/COPD  - Not in exacerbation  - nebs prn  - Continue Singulair       DVT/PE Prophylaxis:  Will discontinue heparin and start SCDs, patient refusing heparin    Disposition Planning: pending     Marissa Nestle, MD

## 2020-12-08 NOTE — Anesthesia Postprocedure Evaluation (Signed)
Anesthesia Post Op Evaluation    Patient: [name]  Procedure(s):  BIOPSY BREAST INCISIONAL    Last Vitals:Temperature: 36.3 C (97.3 F) (12/08/20 1314)  Heart Rate: 94 (12/08/20 1500)  BP (Non-Invasive): (!) 180/97 (12/08/20 1500)  Respiratory Rate: 16 (12/08/20 1500)  SpO2: 95 % (12/08/20 1500)    No notable events documented.    Patient is sufficiently recovered from the effects of anesthesia to participate in the evaluation and has returned to their pre-procedure level.  Patient location during evaluation: PACU       Patient participation: complete - patient participated  Level of consciousness: awake    Pain score: 2  Pain management: adequate  Airway patency: patent    Anesthetic complications: no  Cardiovascular status: acceptable, blood pressure returned to baseline, hemodynamically stable and stable  Respiratory status: acceptable  Hydration status: acceptable  Patient post-procedure temperature: Pt Normothermic   PONV Status: Absent  Comments: Aldrete Score 9. OK to discharge

## 2020-12-08 NOTE — Anesthesia Transfer of Care (Signed)
ANESTHESIA TRANSFER OF CARE   [name] is a 45 y.o. ,female, Weight: 97.4 kg (214 lb 11.7 oz)   had Procedure(s):  BIOPSY BREAST INCISIONAL  performed  12/08/20   Primary Service: Sharl Ma, MD    Past Medical History:   Diagnosis Date   . Asthma    . Congestive heart failure (CMS [facility])    . COPD (chronic obstructive pulmonary disease) (CMS [facility])    . Esophageal reflux    . ESRD (end stage renal disease) (CMS [place])    . HTN (hypertension)    . Sleep apnea       Allergy History as of 12/08/20     LISINOPRIL       Noted Status Severity Type Reaction    12/04/20 1626 Clemencia Course, RN 12/04/20 Active Low  Swelling              I completed my transfer of care / handoff to the receiving personnel during which we discussed:  Access, Airway, All key/critical aspects of case discussed, Analgesia, Antibiotics, Expectation of post procedure, Fluids/Product, Gave opportunity for questions and acknowledgement of understanding, Labs and PMHx    Post Location: PACU                                                                  Last OR Temp: Temperature: 36.3 C (97.3 F)  ABG:  POTASSIUM   Date Value Ref Range Status   12/08/2020 4.7 3.5 - 5.0 mmol/L Final     CALCIUM   Date Value Ref Range Status   12/08/2020 8.4 (L) 8.8 - 10.3 mg/dL Final     Calculated P Axis   Date Value Ref Range Status   12/06/2020 68 degrees Final     Calculated R Axis   Date Value Ref Range Status   12/06/2020 23 degrees Final     Calculated T Axis   Date Value Ref Range Status   12/06/2020 101 degrees Final     Airway:* No LDAs found *  Blood pressure (!) 159/101, pulse 81, temperature 36.3 C (97.3 F), resp. rate 12, height 1.702 m ('5\' 7"'$ ), weight 97.4 kg (214 lb 11.7 oz), SpO2 100 %.

## 2020-12-08 NOTE — OR Surgeon (Signed)
GENERAL SURGERY OPERATIVE NOTE    Patient Name: [name]  Age:  45 y.o.  Sex:  female  MRN:  [id number]  CSN:  [id number]  Date of Service: 12/08/2020  Date of Birth: Feb 27, 1975    Pre-Operative Diagnosis: breast skin biopsy   Post-Operative Diagnosis: .  Procedure(s)/Description:  Procedure(s):  BIOPSY BREAST INCISIONAL    Findings:  Enlarged right breast with skin edema and Peau Donette Larry.      Attending Surgeon: Karl Luke, MD  Assistant(s):  None.    Anesthesia Type:  LMA.  Estimated Blood Loss:  Less than 5 mL  Complications (not routinely expected or not inherent to difficulty/nature of procedure): None  Specimens/ Cultures:  Skin from right breast.           Disposition: PACU  Condition: Stable    Operative Report     Indications:  Patient with right breast tenderness, enlargement and edema with family history of breast cancer is taken to the operating room for skin biopsy after written informed consent has been obtained.    Technique:  Patient is placed in supine position.  General LMA anesthesia was administered.  Right breast is painted and draped in standard fashion.  An area on the right breast on the outer quadrant was chosen an elliptical incision was fashioned and skin was excised 2 cm x 0.5 cm.  Local anesthesia was infiltrated and incision was reapproximated with 3-0 Vicryl interrupted stitches and Dermabond applied.  Patient tolerated procedure well.  Needle, instrument and lap count reported to be correct times twice.  Postop condition stable.    Karl Luke, MD10/15/202212:59  [facility]    Note: A portion of this documentation was generated using MMODAL (voice recognition software) and may contain syntax/voice recognition errors.

## 2020-12-08 NOTE — Care Plan (Signed)
Problem: Adult Inpatient Plan of Care  Goal: Plan of Care Review  Outcome: Ongoing (see interventions/notes)  Goal: Patient-Specific Goal (Individualized)  Outcome: Ongoing (see interventions/notes)  Goal: Absence of Hospital-Acquired Illness or Injury  Outcome: Ongoing (see interventions/notes)  Intervention: Identify and Manage Fall Risk  Recent Flowsheet Documentation  Taken 12/08/2020 0820 by Dustin Flock, RN  Safety Promotion/Fall Prevention:   safety round/check completed   nonskid shoes/slippers when out of bed   fall prevention program maintained  Intervention: Prevent and Manage VTE (Venous Thromboembolism) Risk  Recent Flowsheet Documentation  Taken 12/08/2020 0820 by Dustin Flock, RN  VTE Prevention/Management: ambulation promoted  Goal: Optimal Comfort and Wellbeing  Outcome: Ongoing (see interventions/notes)  Goal: Rounds/Family Conference  Outcome: Ongoing (see interventions/notes)     Problem: Fall Injury Risk  Goal: Absence of Fall and Fall-Related Injury  Outcome: Ongoing (see interventions/notes)  Intervention: Promote Injury-Free Environment  Recent Flowsheet Documentation  Taken 12/08/2020 0820 by Dustin Flock, RN  Safety Promotion/Fall Prevention:   safety round/check completed   nonskid shoes/slippers when out of bed   fall prevention program maintained     Problem: Pain Acute  Goal: Optimal Pain Control and Function  Outcome: Ongoing (see interventions/notes)     Problem: Hemodialysis  Goal: Safe, Effective Therapy Delivery  Outcome: Ongoing (see interventions/notes)  Goal: Effective Tissue Perfusion  Outcome: Ongoing (see interventions/notes)  Goal: Absence of Infection Signs and Symptoms  Outcome: Ongoing (see interventions/notes)     Problem: Anxiety  Goal: Anxiety Reduction or Resolution  Outcome: Ongoing (see interventions/notes)

## 2020-12-08 NOTE — Progress Notes (Signed)
[facility]  Surgery Progress Note    [name], [name], 45 y.o. female  Date of Service: 12/08/2020  Date of Birth:  Jul 04, 1975    Hospital Day:  LOS: 4 days     Procedures: 3 Days Post-Op s/p Procedure(s) (LRB):  VENOGRAM UPPER EXTREMITY - IMAGING ONLY (Right)  SURG VASC US GUIDED ACCESS (Right)  ANGIOPLASTY SVC - IMAGING ONLY (Right)    Subjective/Overnight Events:  No acute events overnight.    Vital Signs:  Temperature: 37 C (98.6 F)  Heart Rate: 81  BP (Non-Invasive): (!) 145/89  Respiratory Rate: 18  SpO2: 96 %    Objective:  Lungs: Lung sounds clear to auscultation bilaterally.  Normal respiratory effort.  No wheezes, rales or rhonchi.    Heart: Regular rate and rhythm  Abdomen: Abdomen soft, nontender, and nondistended.  Bowel sounds within normal limits.  No organomegaly or masses.    Right breast enlarged, tender and edematous    Intake/Output Summary (Last 24 hours) at 12/08/2020 1214  Last data filed at 12/07/2020 2241  Gross per 24 hour   Intake 300 ml   Output 2300 ml   Net -2000 ml       Labs:  CBC Results BMP Results   Recent Labs     12/06/20  0812 12/07/20  0532 12/08/20  0644   WBC 11.4* 13.5* 11.0   HGB 9.3* 9.1* 8.7*   HCT 28.4* 27.8* 27.5*   PLTCNT 271 256 251    Recent Labs     12/06/20  0812 12/07/20  0532 12/08/20  0644   SODIUM 124* 132* 129*   POTASSIUM 4.7 5.2* 4.7   CHLORIDE 92* 95* 101   CO2 '25 25 28   '$ BUN 97* 54* 35*   CREATININE 11.55* 8.33* 6.80*   GFR 4* 6* 7*   ANIONGAP 7 12 0      Cardiac Results   Other Chemistries Results   No results for input(s): CKMB, MBINDEX, BNP in the last 72 hours.    Invalid input(s): UHCEASTROPI Recent Labs     12/06/20  0812 12/07/20  0532 12/08/20  0644   CALCIUM 8.6* 9.2 8.4*   MAGNESIUM 2.0 2.1 2.1   PHOSPHORUS 7.9* 6.8* 5.4*      Liver/Pancreas Enzyme Results Coagulation Studies     No results for input(s): TOTALPROTEIN, ALBUMIN, PREALBUMIN, AST, ALT, ALKPHOS, LDH, AMYLASE, LIPASE in the last 72 hours.    Invalid input(s): GGT No results  for input(s): INR, PROTHROMTME, APTT in the last 72 hours.     Microbiology/Pathology: No results found for any visits on 12/04/20 (from the past 96 hour(s)).  No results found for this visit on 12/04/20.    Radiology:       Assessment/ Plan:  Active Hospital Problems    Diagnosis   . Primary Problem: SVC syndrome   . ESRD (end stage renal disease) (CMS [place])   . Uncontrolled hypertension   . Tobacco dependence   . GERD (gastroesophageal reflux disease)   . Insomnia      [name] is a 45 y.o.female with multiple medical problems and right breast swelling.   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 However since patient has family history of breast cancer will plan for right breast skin biopsy to rule out inflammatory cancer. Right breast ultrasound pending. This was discussed with patient and sister at bedside.Risks, benefits, alternatives, complications explained to patient. Patient understood conversation and is in agreement with  the plan.       Karl Luke, MD 12/08/2020, 12:14    Note: A portion of this documentation was generated using MMODAL (voice recognition software) and may contain syntax/voice recognition errors.

## 2020-12-09 ENCOUNTER — Inpatient Hospital Stay (HOSPITAL_COMMUNITY): Payer: Commercial Managed Care - PPO | Admitting: Radiology

## 2020-12-09 DIAGNOSIS — G4700 Insomnia, unspecified: Secondary | ICD-10-CM

## 2020-12-09 DIAGNOSIS — N63 Unspecified lump in unspecified breast: Secondary | ICD-10-CM

## 2020-12-09 MED ORDER — CARVEDILOL 25 MG TABLET
25.0000 mg | ORAL_TABLET | Freq: Two times a day (BID) | ORAL | Status: DC
Start: 2020-12-09 — End: 2020-12-14
  Administered 2020-12-09 – 2020-12-13 (×9): 25 mg via ORAL
  Filled 2020-12-09 (×9): qty 1

## 2020-12-09 NOTE — Care Plan (Signed)
End of shift  Patient resting in bed with no complaints at this time. Has remained a/o and free from falls during shift. No c/o CP or dyspnea during shift. OnRA. NSRon tele. Medicated for pain per MAR. Ambulated unit during shift.Call light in reach.  Kathryne Gin, RN    This patient currently meets requirements for low to mid level nursing care. The patient will continue to be monitored and re-evaluated for any changes in condition.

## 2020-12-09 NOTE — Nurses Notes (Signed)
Patient has remained alert and oriented. Blood pressure running in normal limits at this time. Complains of right breast pain, but is tolerable with PRN dilaudid. Breast remains edematous, red and painful to palpation. independently ambulates with little difficulty. Denies any questions or concerns at this time. Dustin Flock, RN  12/09/2020, 17:35

## 2020-12-09 NOTE — Progress Notes (Signed)
[facility]  Hospitalist Progress Note    [name]       45 y.o.       Date of service: 12/09/2020  Date of Admission:  12/04/2020    Hospital Day:  LOS: 5 days   CC: SVC syndrome    Subjective:  Patient seen sleeping easily arousable in no acute distress complaining of pain at the biopsy site on the right side of a breast.  Blood pressures been running high despite being on large amount of blood pressure medication.  Has pain medicine ordered.  No new event reported from overnight by the nursing staff.    Objective:   Filed Vitals:    12/09/20 0333 12/09/20 0454 12/09/20 0747 12/09/20 0914   BP: (!) 143/87  (!) 170/100    Pulse: 77  87    Resp: '20 20 18 16   '$ Temp: 36.7 C (98.1 F)  36.9 C (98.4 F)    SpO2: 99%  100%      O2 delivery: None (Room Air)     I have reviewed the vitals.      Input/Output    Intake/Output Summary (Last 24 hours) at 12/09/2020 1437  Last data filed at 12/09/2020 1300  Gross per 24 hour   Intake 320 ml   Output --   Net 320 ml         acetaminophen (TYLENOL) tablet, 650 mg, Oral, Q6H PRN  aspirin (ECOTRIN) enteric coated tablet 81 mg, 81 mg, Oral, Daily  carvedilol (COREG) tablet, 25 mg, Oral, 2x/day  cinacalcet (SENSIPAR) tablet, 60 mg, Oral, Daily  cloNIDine (CATAPRES) tablet, 0.1 mg, Oral, 3x/day  clopidogrel (PLAVIX) 75 mg tablet, 75 mg, Oral, Daily  cyclobenzaprine (FLEXERIL) tablet, 5 mg, Oral, Q8H PRN  diazePAM (VALIUM) tablet, 5 mg, Oral, Q6H PRN  dilTIAZem (CARDIZEM) tablet, 120 mg, Oral, 3x/day  doxazosin (CARDURA) tablet, 4 mg, Oral, QPM  ergocalciferol-vitamin D2 (DRISDOL) capsule, 50,000 Units, Oral, Q7 Days  furosemide (LASIX) tablet, 80 mg, Oral, 2x/day  hydrALAZINE (APRESOLINE) tablet, 100 mg, Oral, 3x/day  HYDROmorphone (DILAUDID) 0.5 mg/0.5 mL injection, 0.4 mg, Intravenous, Q4H PRN  hydrOXYzine pamoate (VISTARIL) capsule, 25 mg, Oral, HS PRN  ipratropium-albuterol 0.5 mg-3 mg(2.5 mg base)/3 mL Solution for Nebulization, 3 mL, Nebulization, Q6H  PRN  isosorbide mononitrate (IMDUR) 24 hr extended release tablet, 60 mg, Oral, QAM  [Held by provider] labetalol (NORMODYNE) tablet, 200 mg, Oral, 3x/day  loratadine (CLARITIN) tablet, 10 mg, Oral, Daily  losartan (COZAAR) tablet, 50 mg, Oral, Daily  montelukast (SINGULAIR) 10 mg tablet, 10 mg, Oral, QPM  nicotine (NICODERM CQ) transdermal patch (mg/24 hr), 14 mg, Transdermal, Daily  NS flush syringe, 3 mL, Intracatheter, Q8HRS  NS flush syringe, 3 mL, Intracatheter, Q1H PRN  ondansetron (ZOFRAN) 2 mg/mL injection, 4 mg, Intravenous, Q6H PRN  oxyCODONE-acetaminophen (PERCOCET) 5-'325mg'$  per tablet, 1 Tablet, Oral, Q4H PRN  pantoprazole (PROTONIX) delayed release tablet, 40 mg, Oral, Daily  polyethylene glycol (MIRALAX) oral packet, 17 g, Oral, Daily  sennosides-docusate sodium (SENOKOT-S) 8.6-'50mg'$  per tablet, 1 Tablet, Oral, 2x/day  sevelamer carbonate (RENVELA) tablet, 2,400 mg, Oral, 3x/day-Meals  spironolactone (ALDACTONE) tablet, 25 mg, Oral, Daily        Physical Exam:  General: No acute distress, In bed  HEENT:  Atraumatic normocephalic, trach midline, supple neck  Cardiac: RRR  Chest: R breast swelling. Permcath to R chest wall  Respiratory: CTA  Abdomen: Distended along R abdomen.   Extremities: R arm > L arm. 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     CBC (Last 24 Hours):    No results for input(s): WBC, HGB, HCT, MCV, PLTCNT in the last 24 hours.      BMP (Last 24 Hours):    No results for input(s): SODIUM, POTASSIUM, CHLORIDE, CO2, BUN, CREATININE, CALCIUM, GLUCOSENF in the last 24 hours.        I have reviewed all labs.    Micro: No results found for any visits on 12/04/20 (from the past 96 hour(s)).    Radiology:         PT/OT: No    Consults: Vascular Surgery, Nephrology, Gen Surgery    Assessment/ Plan:   Active Hospital Problems    Diagnosis   . Primary Problem: SVC syndrome   . ESRD (end stage renal disease) (CMS [place])   . Uncontrolled hypertension   . Tobacco dependence   . GERD (gastroesophageal  reflux disease)   . Insomnia     Possible SVC Syndrome  - Outside CTA w/ concern for moderate narrowing of the distal R subclavian vein at its junction  - Had significant R sided swelling-now resolved  - Vascular Surgery consult appreciated  - s/p Right venogram and balloon angioplasty of R innominate vein. Has some back pain post procedure but otherwise tolerated well.   -swelling improved however breast continues to be swollen  - Gen Surg consult for breast swelling.   -underwent breast biopsy 12/08/2020, biopsy report pending  -right breast ultrasound ordered    Resistant hypertension  - Reports difficult to control hypertension and on multiple anti-hypertensive including clonidine PO and patch, coreg, labetalol, Cardizem, Cardura, lasix, Imdur, losartan and aldactone. Patient has all her bottles in the room with her.   -increased Coreg to 25 mg b.i.d. from 12.5  - Can continue home meds for now and adjust as needed. Will appreciate nephro assistance in adjusting  - Monitor BP  - Telemetry    Leukocytosis  - Completed 7 days of antibiotics at outside hospital  - Continue to monitor and hold off on Abx.   - Suspecting reactive    ESRD  Hyponatremia  - Diagnosed in 2010. Permcath placed 2021  - Nephro consult for HD  - Continue cinacalcet + renelva    Anemia  - Hb stable, likely 2/2 to CKD    GERD  - Continue PPI    Tobacco dependence  - Nicotine patch    Asthma/COPD  - Not in exacerbation  - nebs prn  - Continue Singulair       DVT/PE Prophylaxis:  Will discontinue heparin and start SCDs, patient refusing heparin    Disposition Planning: pending     Marissa Nestle, MD

## 2020-12-09 NOTE — Care Plan (Signed)
Problem: Adult Inpatient Plan of Care  Goal: Plan of Care Review  Outcome: Ongoing (see interventions/notes)  Goal: Patient-Specific Goal (Individualized)  Outcome: Ongoing (see interventions/notes)  Goal: Absence of Hospital-Acquired Illness or Injury  Outcome: Ongoing (see interventions/notes)  Intervention: Identify and Manage Fall Risk  Recent Flowsheet Documentation  Taken 12/09/2020 0747 by Dustin Flock, RN  Safety Promotion/Fall Prevention:   safety round/check completed   nonskid shoes/slippers when out of bed   fall prevention program maintained  Intervention: Prevent and Manage VTE (Venous Thromboembolism) Risk  Recent Flowsheet Documentation  Taken 12/09/2020 0747 by Dustin Flock, RN  VTE Prevention/Management: ambulation promoted  Goal: Optimal Comfort and Wellbeing  Outcome: Ongoing (see interventions/notes)  Goal: Rounds/Family Conference  Outcome: Ongoing (see interventions/notes)     Problem: Fall Injury Risk  Goal: Absence of Fall and Fall-Related Injury  Outcome: Ongoing (see interventions/notes)  Intervention: Promote Injury-Free Environment  Recent Flowsheet Documentation  Taken 12/09/2020 0747 by Dustin Flock, RN  Safety Promotion/Fall Prevention:   safety round/check completed   nonskid shoes/slippers when out of bed   fall prevention program maintained     Problem: Pain Acute  Goal: Optimal Pain Control and Function  Outcome: Ongoing (see interventions/notes)     Problem: Hemodialysis  Goal: Safe, Effective Therapy Delivery  Outcome: Ongoing (see interventions/notes)  Goal: Effective Tissue Perfusion  Outcome: Ongoing (see interventions/notes)  Goal: Absence of Infection Signs and Symptoms  Outcome: Ongoing (see interventions/notes)     Problem: Anxiety  Goal: Anxiety Reduction or Resolution  Outcome: Ongoing (see interventions/notes)

## 2020-12-09 NOTE — Progress Notes (Signed)
[facility]  Surgery Progress Note    Dawn, Malone, 45 y.o. female  Date of Service: 12/09/2020  Date of Birth:  1975/08/27    Hospital Day:  LOS: 5 days     Procedures: 1 Day Post-Op s/p Procedure(s) (LRB):  BIOPSY BREAST INCISIONAL (Right)    Subjective/Overnight Events:  No acute events overnight.  Complaining of incisional pain to right breast.    Vital Signs:  Temperature: 36.9 C (98.4 F)  Heart Rate: 87  BP (Non-Invasive): (!) 170/100  Respiratory Rate: 16  SpO2: 100 %    Objective:  Lungs: Lung sounds clear to auscultation bilaterally.  Normal respiratory effort.  No wheezes, rales or rhonchi.    Heart: Regular rate and rhythm  Abdomen: Abdomen soft, nontender, and nondistended.  Bowel sounds within normal limits.  No organomegaly or masses.    Right breast enlarged, tender and edematous with incision clean, dry and intact.    Intake/Output Summary (Last 24 hours) at 12/09/2020 1135  Last data filed at 12/08/2020 1934  Gross per 24 hour   Intake 370 ml   Output --   Net 370 ml       Labs:  CBC Results BMP Results   Recent Labs     12/07/20  0532 12/08/20  0644   WBC 13.5* 11.0   HGB 9.1* 8.7*   HCT 27.8* 27.5*   PLTCNT 256 251    Recent Labs     12/07/20  0532 12/08/20  0644   SODIUM 132* 129*   POTASSIUM 5.2* 4.7   CHLORIDE 95* 101   CO2 25 28   BUN 54* 35*   CREATININE 8.33* 6.80*   GFR 6* 7*   ANIONGAP 12 0      Cardiac Results   Other Chemistries Results   No results for input(s): CKMB, MBINDEX, BNP in the last 72 hours.    Invalid input(s): UHCEASTROPI Recent Labs     12/07/20  0532 12/08/20  0644   CALCIUM 9.2 8.4*   MAGNESIUM 2.1 2.1   PHOSPHORUS 6.8* 5.4*      Liver/Pancreas Enzyme Results Coagulation Studies     No results for input(s): TOTALPROTEIN, ALBUMIN, PREALBUMIN, AST, ALT, ALKPHOS, LDH, AMYLASE, LIPASE in the last 72 hours.    Invalid input(s): GGT No results for input(s): INR, PROTHROMTME, APTT in the last 72 hours.     Microbiology/Pathology: No results found for any visits  on 12/04/20 (from the past 96 hour(s)).  No results found for this visit on 12/04/20.    Radiology:       Assessment/ Plan:  Active Hospital Problems    Diagnosis   . Primary Problem: SVC syndrome   . ESRD (end stage renal disease) (CMS [place])   . Uncontrolled hypertension   . Tobacco dependence   . GERD (gastroesophageal reflux disease)   . Insomnia      [name] is a 45 y.o.female with multiple medical problems and right breast swelling.   Patient is status post right breast skin biopsy to rule out cancer.    Await pathology results.      Karl Luke, MD 12/09/2020, 11:35    Note: A portion of this documentation was generated using MMODAL (voice recognition software) and may contain syntax/voice recognition errors.

## 2020-12-10 DIAGNOSIS — K219 Gastro-esophageal reflux disease without esophagitis: Secondary | ICD-10-CM

## 2020-12-10 DIAGNOSIS — I120 Hypertensive chronic kidney disease with stage 5 chronic kidney disease or end stage renal disease: Secondary | ICD-10-CM

## 2020-12-10 LAB — RENAL FUNCTION PANEL
ALBUMIN: 3.8 g/dL (ref 3.2–4.8)
ANION GAP: 10 mmol/L
BUN/CREA RATIO: 6
BUN: 65 mg/dL — ABNORMAL HIGH (ref 10–25)
CALCIUM: 9 mg/dL (ref 8.8–10.3)
CHLORIDE: 93 mmol/L — ABNORMAL LOW (ref 98–111)
CO2 TOTAL: 22 mmol/L (ref 21–35)
CREATININE: 10.14 mg/dL — ABNORMAL HIGH (ref <=1.30)
ESTIMATED GFR: 4 mL/min/{1.73_m2} — ABNORMAL LOW
GLUCOSE: 66 mg/dL — ABNORMAL LOW (ref 70–110)
PHOSPHORUS: 7.1 mg/dL — ABNORMAL HIGH (ref 2.5–4.5)
POTASSIUM: 6.9 mmol/L (ref 3.5–5.0)
SODIUM: 125 mmol/L — ABNORMAL LOW (ref 135–145)

## 2020-12-10 LAB — MAGNESIUM: MAGNESIUM: 2.4 mg/dL — ABNORMAL HIGH (ref 1.8–2.3)

## 2020-12-10 NOTE — Progress Notes (Signed)
[facility]  Hospitalist Progress Note    [name]       45 y.o.       Date of service: 12/10/2020  Date of Admission:  12/04/2020    Hospital Day:  LOS: 6 days   CC: SVC syndrome    Subjective:  Patient seen and evaluated at the bedside in no acute cardiopulmonary distress.  Her potassium has been elevated but she was waiting to go to dialysis and she was asymptomatic denied any fevers chills, palpitation, dizziness or visual changes and no chest pain or shortness of breath.    Objective:   Filed Vitals:    12/10/20 0535 12/10/20 0823 12/10/20 0925 12/10/20 1140   BP:  (!) 174/102  (!) 153/92   Pulse:  73  72   Resp: '18 16 18    '$ Temp:  37 C (98.6 F)     SpO2:  99%       O2 delivery: None (Room Air)     I have reviewed the vitals.      Input/Output  No intake or output data in the 24 hours ending 12/10/20 1502      acetaminophen (TYLENOL) tablet, 650 mg, Oral, Q6H PRN  aspirin (ECOTRIN) enteric coated tablet 81 mg, 81 mg, Oral, Daily  carvedilol (COREG) tablet, 25 mg, Oral, 2x/day  cinacalcet (SENSIPAR) tablet, 60 mg, Oral, Daily  cloNIDine (CATAPRES) tablet, 0.1 mg, Oral, 3x/day  clopidogrel (PLAVIX) 75 mg tablet, 75 mg, Oral, Daily  cyclobenzaprine (FLEXERIL) tablet, 5 mg, Oral, Q8H PRN  diazePAM (VALIUM) tablet, 5 mg, Oral, Q6H PRN  dilTIAZem (CARDIZEM) tablet, 120 mg, Oral, 3x/day  doxazosin (CARDURA) tablet, 4 mg, Oral, QPM  ergocalciferol-vitamin D2 (DRISDOL) capsule, 50,000 Units, Oral, Q7 Days  furosemide (LASIX) tablet, 80 mg, Oral, 2x/day  hydrALAZINE (APRESOLINE) tablet, 100 mg, Oral, 3x/day  HYDROmorphone (DILAUDID) 0.5 mg/0.5 mL injection, 0.4 mg, Intravenous, Q4H PRN  hydrOXYzine pamoate (VISTARIL) capsule, 25 mg, Oral, HS PRN  ipratropium-albuterol 0.5 mg-3 mg(2.5 mg base)/3 mL Solution for Nebulization, 3 mL, Nebulization, Q6H PRN  isosorbide mononitrate (IMDUR) 24 hr extended release tablet, 60 mg, Oral, QAM  [Held by provider] labetalol (NORMODYNE) tablet, 200 mg, Oral,  3x/day  loratadine (CLARITIN) tablet, 10 mg, Oral, Daily  losartan (COZAAR) tablet, 50 mg, Oral, Daily  montelukast (SINGULAIR) 10 mg tablet, 10 mg, Oral, QPM  nicotine (NICODERM CQ) transdermal patch (mg/24 hr), 14 mg, Transdermal, Daily  NS flush syringe, 3 mL, Intracatheter, Q8HRS  NS flush syringe, 3 mL, Intracatheter, Q1H PRN  ondansetron (ZOFRAN) 2 mg/mL injection, 4 mg, Intravenous, Q6H PRN  oxyCODONE-acetaminophen (PERCOCET) 5-'325mg'$  per tablet, 1 Tablet, Oral, Q4H PRN  pantoprazole (PROTONIX) delayed release tablet, 40 mg, Oral, Daily  polyethylene glycol (MIRALAX) oral packet, 17 g, Oral, Daily  sennosides-docusate sodium (SENOKOT-S) 8.6-'50mg'$  per tablet, 1 Tablet, Oral, 2x/day  sevelamer carbonate (RENVELA) tablet, 2,400 mg, Oral, 3x/day-Meals  spironolactone (ALDACTONE) tablet, 25 mg, Oral, Daily        Physical Exam:  General: No acute distress, In bed  HEENT:  Atraumatic normocephalic, trach midline, supple neck  Cardiac: RRR  Chest: R breast swelling. Permcath to R chest wall  Respiratory: CTA  Abdomen: Distended along R abdomen.   Extremities: R arm > L arm. 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     CBC (Last 24 Hours):    No results for input(s): WBC, HGB, HCT, MCV, PLTCNT in the last 24 hours.      BMP (Last 24  Hours):    Recent Results last 24 hours     12/10/20  0801   SODIUM 125*   POTASSIUM 6.9*   CHLORIDE 93*   CO2 22   BUN 65*   CREATININE 10.14*   CALCIUM 9.0   GLUCOSENF 66*           I have reviewed all labs.    Micro: No results found for any visits on 12/04/20 (from the past 96 hour(s)).    Radiology:    Results for orders placed or performed during the hospital encounter of 12/04/20 (from the past 24 hour(s))   US BREAST RIGHT-ADDL VIEWS/BREAST US AS REQ BY RAD     Status: None    Narrative    FILMS COMPARED:  No comparisons were made when reading this study.    HISTORY:  Procedure: US BREAST RIGHT LIMITED-ADDL VIEWS/BREAST US AS REQ BY RAD  Reason for exam:     swelling    FINDINGS:    There is skin thickening and significant subcutaneous edema of the right   breast at 9:00, 5 to 8 cm from the nipple in the region of a weighted. No   focal fluid collection or solid mass demonstrated. Prominent right   axillary lymph nodes. The largest demonstrates cortical thickening of 4.4   mm.    Findings mastitis without focal fluid collection. Please correlate with   history of infectious mastitis. If this fails to improve with appropriate   management then follow-up mammogram and ultrasound is recommended and   possibly skin punch biopsy. The lymph nodes are probably reactive for   which follow-up ultrasound is recommended.        Impression    Ultrasound follow up in 3 months is recommended for the right breast.    BI-RADS ATLAS category (right): 3 - Probably Benign       PT/OT: No    Consults: Vascular Surgery, Nephrology, Gen Surgery    Assessment/ Plan:   Active Hospital Problems    Diagnosis   . Primary Problem: SVC syndrome   . ESRD (end stage renal disease) (CMS [place])   . Uncontrolled hypertension   . Tobacco dependence   . GERD (gastroesophageal reflux disease)   . Insomnia     Possible SVC Syndrome  - Outside CTA w/ concern for moderate narrowing of the distal R subclavian vein at its junction  - Had significant R sided swelling-now resolved  - Vascular Surgery consult appreciated  - s/p Right venogram and balloon angioplasty of R innominate vein. Has some back pain post procedure but otherwise tolerated well.   -swelling improved however breast continues to be swollen  - Gen Surg consult for breast swelling.   -underwent breast biopsy 12/08/2020, biopsy report pending  -right breast ultrasound showed mastitis with no focal fluid collection, recommended mammogram and ultrasound in 3 months.  Skin Biopsies still pending.    Resistant hypertension  - Reports difficult to control hypertension and on multiple anti-hypertensive including clonidine PO and patch, coreg,  labetalol, Cardizem, Cardura, lasix, Imdur, losartan and aldactone. Patient has all her bottles in the room with her.   -increased Coreg to 25 mg b.i.d. from 12.5  - Can continue home meds for now and adjust as needed. Will appreciate nephro assistance in adjusting  - Monitor BP  - Telemetry    Leukocytosis  - Completed 7 days of antibiotics at outside hospital  - Continue to monitor and hold off on Abx.   -  Suspecting reactive    ESRD  Hyponatremia and hyperkalemia  - Diagnosed in 2010. Permcath placed 2021  - Nephro consult for HD  - Continue cinacalcet + renelva    Anemia  - Hb stable, likely 2/2 to CKD    GERD  - Continue PPI    Tobacco dependence  - Nicotine patch    Asthma/COPD  - Not in exacerbation  - nebs prn  - Continue Singulair       DVT/PE Prophylaxis:  Will discontinue heparin and start SCDs, patient refusing heparin    Disposition Planning: pending     Marissa Nestle, MD

## 2020-12-10 NOTE — Care Plan (Signed)
Problem: Adult Inpatient Plan of Care  Goal: Plan of Care Review  Outcome: Ongoing (see interventions/notes)  Goal: Patient-Specific Goal (Individualized)  Outcome: Ongoing (see interventions/notes)  Goal: Absence of Hospital-Acquired Illness or Injury  Outcome: Ongoing (see interventions/notes)  Intervention: Identify and Manage Fall Risk  Recent Flowsheet Documentation  Taken 12/10/2020 0823 by Dustin Flock, RN  Safety Promotion/Fall Prevention:   safety round/check completed   nonskid shoes/slippers when out of bed   fall prevention program maintained  Intervention: Prevent and Manage VTE (Venous Thromboembolism) Risk  Recent Flowsheet Documentation  Taken 12/10/2020 [id number] by Dustin Flock, RN  VTE Prevention/Management: ambulation promoted  Goal: Optimal Comfort and Wellbeing  Outcome: Ongoing (see interventions/notes)  Goal: Rounds/Family Conference  Outcome: Ongoing (see interventions/notes)     Problem: Fall Injury Risk  Goal: Absence of Fall and Fall-Related Injury  Outcome: Ongoing (see interventions/notes)  Intervention: Promote Injury-Free Environment  Recent Flowsheet Documentation  Taken 12/10/2020 0823 by Dustin Flock, RN  Safety Promotion/Fall Prevention:   safety round/check completed   nonskid shoes/slippers when out of bed   fall prevention program maintained     Problem: Pain Acute  Goal: Optimal Pain Control and Function  Outcome: Ongoing (see interventions/notes)     Problem: Hemodialysis  Goal: Safe, Effective Therapy Delivery  Outcome: Ongoing (see interventions/notes)  Goal: Effective Tissue Perfusion  Outcome: Ongoing (see interventions/notes)  Goal: Absence of Infection Signs and Symptoms  Outcome: Ongoing (see interventions/notes)     Problem: Anxiety  Goal: Anxiety Reduction or Resolution  Outcome: Ongoing (see interventions/notes)     Problem: Skin or Soft Tissue Infection  Goal: Absence of Infection Signs and Symptoms  Outcome: Ongoing (see interventions/notes)

## 2020-12-10 NOTE — Nurses Notes (Signed)
Patient remains alert and oriented. Ambulates independently without difficulty. Complained of Right breast pain, given PRN medication, pain is at a tolerable level. Breast remains, red and edematous. Tender to palpation. Denies any questions or concerns at this time. Dustin Flock, RN  12/10/2020, 19:16

## 2020-12-10 NOTE — Consults (Signed)
[facility]                                                                                                   Follow Up Consult Note        [name]  Date of service: 12/10/2020     Active Hospital Problems    Diagnosis   . Primary Problem: SVC syndrome   . ESRD (end stage renal disease) (CMS [place])   . Uncontrolled hypertension   . Tobacco dependence   . GERD (gastroesophageal reflux disease)   . Insomnia         Subjective     SEEN ON DIALYSIS IN NO DISTRESS K 6.9,    ROS: Other than ROS in the HPI, all other systems were negative.  acetaminophen (TYLENOL) tablet, 650 mg, Oral, Q6H PRN  aspirin (ECOTRIN) enteric coated tablet 81 mg, 81 mg, Oral, Daily  carvedilol (COREG) tablet, 25 mg, Oral, 2x/day  cinacalcet (SENSIPAR) tablet, 60 mg, Oral, Daily  cloNIDine (CATAPRES) tablet, 0.1 mg, Oral, 3x/day  clopidogrel (PLAVIX) 75 mg tablet, 75 mg, Oral, Daily  cyclobenzaprine (FLEXERIL) tablet, 5 mg, Oral, Q8H PRN  diazePAM (VALIUM) tablet, 5 mg, Oral, Q6H PRN  dilTIAZem (CARDIZEM) tablet, 120 mg, Oral, 3x/day  doxazosin (CARDURA) tablet, 4 mg, Oral, QPM  ergocalciferol-vitamin D2 (DRISDOL) capsule, 50,000 Units, Oral, Q7 Days  furosemide (LASIX) tablet, 80 mg, Oral, 2x/day  hydrALAZINE (APRESOLINE) tablet, 100 mg, Oral, 3x/day  HYDROmorphone (DILAUDID) 0.5 mg/0.5 mL injection, 0.4 mg, Intravenous, Q4H PRN  hydrOXYzine pamoate (VISTARIL) capsule, 25 mg, Oral, HS PRN  ipratropium-albuterol 0.5 mg-3 mg(2.5 mg base)/3 mL Solution for Nebulization, 3 mL, Nebulization, Q6H PRN  isosorbide mononitrate (IMDUR) 24 hr extended release tablet, 60 mg, Oral, QAM  [Held by provider] labetalol (NORMODYNE) tablet, 200 mg, Oral, 3x/day  loratadine (CLARITIN) tablet, 10 mg, Oral, Daily  losartan (COZAAR) tablet, 50 mg, Oral, Daily  montelukast (SINGULAIR) 10 mg tablet, 10 mg, Oral, QPM  nicotine (NICODERM CQ) transdermal patch  (mg/24 hr), 14 mg, Transdermal, Daily  NS flush syringe, 3 mL, Intracatheter, Q8HRS  NS flush syringe, 3 mL, Intracatheter, Q1H PRN  ondansetron (ZOFRAN) 2 mg/mL injection, 4 mg, Intravenous, Q6H PRN  oxyCODONE-acetaminophen (PERCOCET) 5-'325mg'$  per tablet, 1 Tablet, Oral, Q4H PRN  pantoprazole (PROTONIX) delayed release tablet, 40 mg, Oral, Daily  polyethylene glycol (MIRALAX) oral packet, 17 g, Oral, Daily  sennosides-docusate sodium (SENOKOT-S) 8.6-'50mg'$  per tablet, 1 Tablet, Oral, 2x/day  sevelamer carbonate (RENVELA) tablet, 2,400 mg, Oral, 3x/day-Meals  spironolactone (ALDACTONE) tablet, 25 mg, Oral, Daily        Allergies   Allergen Reactions   . Lisinopril Swelling       Objective     Vital Signs:  Temp (24hrs) Max:37 C (0000000 F)      Systolic (123XX123), 0000000 , Min:127 , 123XX123     Diastolic (123XX123), 123456, Min:83, Max:102    Temp  Avg: 36.8 C (98.2 F)  Min: 36.6 C (97.9 F)  Max: 37 C (98.6 F)  MAP (Non-Invasive)  Avg: 107 mmHG  Min: 99 mmHG  Max:  122 mmHG  Pulse  Avg: 73.4  Min: 58  Max: 82  Resp  Avg: 17.4  Min: 16  Max: 18  SpO2  Avg: 99 %  Min: 98 %  Max: 100 %     Fi02    I/O:  I/O last 24 hours:      Intake/Output Summary (Last 24 hours) at 12/10/2020 1149  Last data filed at 12/09/2020 1300  Gross per 24 hour   Intake 200 ml   Output --   Net 200 ml     I/O current shift:  No intake/output data recorded.  Blood Sugars: Last Fingerstick:  No results found for: GLUCOSEPOC    Physical Exam:  Constitutional:  appears chronically ill, moderately obese, no distress and vital signs reviewed  Respiratory:  decreased breath sounds bibasilar  Cardiovascular:  regular rate and rhythm  EXT 1+ PEDAL EDEMA    Labs:  BMP:    BMP (Last 24 Hours):  Recent Results last 24 hours     12/10/20  0801   SODIUM 125*   POTASSIUM 6.9*   CHLORIDE 93*   CO2 22   BUN 65*   CREATININE 10.14*   CALCIUM 9.0   GLUCOSENF 66*     CBC Results Differential Results   No results found for this encounter No results found for this or  any previous visit (from the past 30 hour(s)).       Radiology:      Microbiology:        Assessment/ Plan:   HYPERKALEMIA WILL USE K-1 BATH ON DIALYSIS  ESRD HD MWF  SVC SYNDROME VASCULAR FOLLOWING        Donny Pique, MD

## 2020-12-10 NOTE — Care Plan (Signed)
Patient is AxOx4, NSR, RA. Patient is independent. Patient had frequent c/o pain, PRN medication given. Patient ambulated around the unit and tolerated well. Patient right breast still significantly edematous with pain. Patient will have dialysis today. No further needs to be addressed.    Tamari Redwine M. Jamal Collin, RN  12/10/2020, 06:13     Problem: Pain Acute  Goal: Optimal Pain Control and Function  Outcome: Ongoing (see interventions/notes)  Intervention: Optimize Psychosocial Wellbeing  Recent Flowsheet Documentation  Taken 12/09/2020 2000 by Micah Flesher., RN  Diversional Activities: television  Supportive Measures: active listening utilized     Problem: Hemodialysis  Goal: Safe, Effective Therapy Delivery  Outcome: Ongoing (see interventions/notes)     Problem: Skin or Soft Tissue Infection  Goal: Absence of Infection Signs and Symptoms  Outcome: Ongoing (see interventions/notes)  Intervention: Minimize and Manage Infection Progression  Recent Flowsheet Documentation  Taken 12/10/2020 0400 by Micah Flesher., RN  Infection Prevention: promote handwashing  Taken 12/10/2020 0226 by Micah Flesher., RN  Infection Prevention: promote handwashing  Taken 12/10/2020 0000 by Micah Flesher., RN  Infection Prevention: promote handwashing  Taken 12/09/2020 2200 by Micah Flesher., RN  Infection Prevention: promote handwashing  Taken 12/09/2020 2000 by Micah Flesher., RN  Fever Reduction/Comfort Measures:  . lightweight bedding  . lightweight clothing  Infection Prevention: promote handwashing

## 2020-12-10 NOTE — Progress Notes (Signed)
[facility]  Surgery Progress Note    [name], 45 y.o. female  Date of Service: 12/10/2020  Date of Birth:  June 05, 1975    Hospital Day:  LOS: 6 days     Procedures: 2 Days Post-Op s/p Procedure(s) (LRB):  BIOPSY BREAST INCISIONAL (Right)    Subjective/Overnight Events:  Patient resting in bed. No acute events overnight per nursing.  She is complaining of incisional pain to right breast. Denies fever, chills, or nausea.     Vital Signs:  Temperature: 37 C (98.6 F)  Heart Rate: 72  BP (Non-Invasive): (!) 153/92  Respiratory Rate: 18  SpO2: 99 %    Objective:  Lungs: Lung sounds clear to auscultation bilaterally.  Normal respiratory effort.  No wheezes, rales or rhonchi.    Heart: Regular rate and rhythm  Abdomen: Abdomen soft, nontender, and nondistended.  Bowel sounds within normal limits.  No organomegaly or masses.    Right breast enlarged, tender and edematous with incision clean, dry and intact. Dressing intact.  No intake or output data in the 24 hours ending 12/10/20 1447    Labs:  CBC Results BMP Results   Recent Labs     12/08/20  0644   WBC 11.0   HGB 8.7*   HCT 27.5*   PLTCNT 251    Recent Labs     12/08/20  0644 12/10/20  0801   SODIUM 129* 125*   POTASSIUM 4.7 6.9*   CHLORIDE 101 93*   CO2 28 22   BUN 35* 65*   CREATININE 6.80* 10.14*   GFR 7* 4*   ANIONGAP 0 10      Cardiac Results   Other Chemistries Results   No results for input(s): CKMB, MBINDEX, BNP in the last 72 hours.    Invalid input(s): UHCEASTROPI Recent Labs     12/08/20  0644 12/10/20  0801   CALCIUM 8.4* 9.0   ALBUMIN  --  3.8   MAGNESIUM 2.1 2.4*   PHOSPHORUS 5.4* 7.1*      Liver/Pancreas Enzyme Results Coagulation Studies     Recent Labs     12/10/20  0801   ALBUMIN 3.8    No results for input(s): INR, PROTHROMTME, APTT in the last 72 hours.     Microbiology/Pathology: No results found for any visits on 12/04/20 (from the past 96 hour(s)).  No results found for this visit on 12/04/20.    Radiology:    Results for  orders placed or performed during the hospital encounter of 12/04/20 (from the past 24 hour(s))   US BREAST RIGHT-ADDL VIEWS/BREAST US AS REQ BY RAD     Status: None    Narrative    FILMS COMPARED:  No comparisons were made when reading this study.    HISTORY:  Procedure: US BREAST RIGHT LIMITED-ADDL VIEWS/BREAST US AS REQ BY RAD  Reason for exam:    swelling    FINDINGS:    There is skin thickening and significant subcutaneous edema of the right   breast at 9:00, 5 to 8 cm from the nipple in the region of a weighted. No   focal fluid collection or solid mass demonstrated. Prominent right   axillary lymph nodes. The largest demonstrates cortical thickening of 4.4   mm.    Findings mastitis without focal fluid collection. Please correlate with   history of infectious mastitis. If this fails to improve with appropriate   management then follow-up mammogram and ultrasound is recommended and  possibly skin punch biopsy. The lymph nodes are probably reactive for   which follow-up ultrasound is recommended.        Impression    Ultrasound follow up in 3 months is recommended for the right breast.    BI-RADS ATLAS category (right): 3 - Probably Benign       Assessment/ Plan:  Active Hospital Problems    Diagnosis    Primary Problem: SVC syndrome    ESRD (end stage renal disease) (CMS [place])    Uncontrolled hypertension    Tobacco dependence    GERD (gastroesophageal reflux disease)    Insomnia      [name] is a 45 y.o.female with multiple medical problems and right breast swelling.   Patient is status post right breast skin biopsy on 12/08/20 to rule out cancer.   Await pathology results.    Yolonda Kida, APRN,NP-C     Attestation signed by Karl Luke, MD  12/11/2020, 17:35   I was physically present and available for consultation from the APP that evaluated the patient; however, I did not personally evaluate the patient today in the hospital. Signature of this note does not indicate that I personally  evaluated the patient or provided any input into the patient's care.      Karl Luke, MD  12/11/2020, 17:35  [facility]       Note: A portion of this documentation was generated using MMODAL (voice recognition software) and may contain syntax/voice recognition errors.

## 2020-12-11 DIAGNOSIS — N186 End stage renal disease: Secondary | ICD-10-CM

## 2020-12-11 DIAGNOSIS — E8770 Fluid overload, unspecified: Secondary | ICD-10-CM

## 2020-12-11 DIAGNOSIS — Z992 Dependence on renal dialysis: Secondary | ICD-10-CM

## 2020-12-11 DIAGNOSIS — E875 Hyperkalemia: Secondary | ICD-10-CM

## 2020-12-11 LAB — CBC WITH DIFF
BASOPHIL #: <0.1 10*3/uL (ref <=0.20)
BASOPHIL %: 0 %
EOSINOPHIL #: 0.33 10*3/uL (ref <=0.50)
EOSINOPHIL %: 3 %
HCT: 28.2 % — ABNORMAL LOW (ref 34.8–46.0)
HGB: 8.8 g/dL — ABNORMAL LOW (ref 11.5–16.0)
IMMATURE GRANULOCYTE #: <0.1 10*3/uL (ref <0.10)
IMMATURE GRANULOCYTE %: 1 % (ref 0–1)
LYMPHOCYTE #: 1.46 10*3/uL (ref 1.00–4.80)
LYMPHOCYTE %: 13 %
MCH: 28.9 pg (ref 26.0–32.0)
MCHC: 31.2 g/dL (ref 31.0–35.5)
MCV: 92.5 fL (ref 78.0–100.0)
MONOCYTE #: 1.19 10*3/uL — ABNORMAL HIGH (ref 0.20–1.10)
MONOCYTE %: 11 %
MPV: 9 fL (ref 8.7–12.5)
NEUTROPHIL #: 7.92 10*3/uL — ABNORMAL HIGH (ref 1.50–7.70)
NEUTROPHIL %: 72 %
PLATELETS: 258 10*3/uL (ref 150–400)
RBC: 3.05 10*6/uL — ABNORMAL LOW (ref 3.85–5.22)
RDW-CV: 17.2 % — ABNORMAL HIGH (ref 11.5–15.5)
WBC: 11 10*3/uL (ref 3.7–11.0)

## 2020-12-11 LAB — BASIC METABOLIC PANEL
ANION GAP: 12 mmol/L
BUN/CREA RATIO: 5
BUN: 35 mg/dL — ABNORMAL HIGH (ref 10–25)
CALCIUM: 8.7 mg/dL — ABNORMAL LOW (ref 8.8–10.3)
CHLORIDE: 93 mmol/L — ABNORMAL LOW (ref 98–111)
CO2 TOTAL: 27 mmol/L (ref 21–35)
CREATININE: 7.43 mg/dL — ABNORMAL HIGH (ref <=1.30)
ESTIMATED GFR: 6 mL/min/{1.73_m2} — ABNORMAL LOW
GLUCOSE: 94 mg/dL (ref 70–110)
POTASSIUM: 4.9 mmol/L (ref 3.5–5.0)
SODIUM: 132 mmol/L — ABNORMAL LOW (ref 135–145)

## 2020-12-11 LAB — MAGNESIUM: MAGNESIUM: 1.8 mg/dL (ref 1.8–2.3)

## 2020-12-11 NOTE — Progress Notes (Signed)
[facility]  Surgery Progress Note    [name], [name], 45 y.o. female  Date of Service: 12/11/2020  Date of Birth:  Nov 03, 1975    Hospital Day:  LOS: 7 days     Procedures: 3 Days Post-Op s/p Procedure(s) (LRB):  BIOPSY BREAST INCISIONAL (Right)    Subjective/Overnight Events:  Patient resting in bed. She is still complaining of incisional pain to right breast. She denies fever, chills, or nausea.     Vital Signs:  Temperature: 36.5 C (97.7 F)  Heart Rate: 75  BP (Non-Invasive): 128/81  Respiratory Rate: 15  SpO2: 98 %    Objective:  Lungs: Lung sounds clear to auscultation bilaterally.  Normal respiratory effort.  No wheezes, rales or rhonchi.    Heart: Regular rate and rhythm  Abdomen: Nontender, and nondistended. Bowel sounds within normal limits.  No organomegaly or masses.    Right breast enlarged, tender and edematous with incision clean, dry and intact. Dressing intact.    Intake/Output Summary (Last 24 hours) at 12/11/2020 1628  Last data filed at 12/11/2020 0024  Gross per 24 hour   Intake 720 ml   Output --   Net 720 ml       Labs:  CBC Results BMP Results   Recent Labs     12/11/20  0542   WBC 11.0   HGB 8.8*   HCT 28.2*   PLTCNT 258    Recent Labs     12/10/20  0801 12/11/20  0542   SODIUM 125* 132*   POTASSIUM 6.9* 4.9   CHLORIDE 93* 93*   CO2 22 27   BUN 65* 35*   CREATININE 10.14* 7.43*   GFR 4* 6*   ANIONGAP 10 12      Cardiac Results   Other Chemistries Results   No results for input(s): CKMB, MBINDEX, BNP in the last 72 hours.    Invalid input(s): UHCEASTROPI Recent Labs     12/10/20  0801 12/11/20  0542   CALCIUM 9.0 8.7*   ALBUMIN 3.8  --    MAGNESIUM 2.4* 1.8   PHOSPHORUS 7.1*  --       Liver/Pancreas Enzyme Results Coagulation Studies     Recent Labs     12/10/20  0801   ALBUMIN 3.8    No results for input(s): INR, PROTHROMTME, APTT in the last 72 hours.     Microbiology/Pathology: No results found for any visits on 12/04/20 (from the past 96 hour(s)).  No results found for this  visit on 12/04/20.    Radiology:         Assessment/ Plan:  Active Hospital Problems    Diagnosis   . Primary Problem: SVC syndrome   . ESRD (end stage renal disease) (CMS [place])   . Uncontrolled hypertension   . Tobacco dependence   . GERD (gastroesophageal reflux disease)   . Insomnia      [name] is a 45 y.o.female with multiple medical problems and right breast swelling.   Patient is status post right breast skin biopsy on 12/08/20 to rule out cancer.   Waiting for pathology results. Will plan when results have been obtained.     Dawn Kida, APRN,NP-C     Attestation signed by Karl Luke, MD  12/11/2020, 17:30   I was physically present and available for consultation from the APP that evaluated the patient; however, I did not personally evaluate the patient today in the hospital. Signature of this note does not  indicate that I personally evaluated the patient or provided any input into the patient's care.      Karl Luke, MD  12/11/2020, 17:30  [facility]       Note: A portion of this documentation was generated using MMODAL (voice recognition software) and may contain syntax/voice recognition errors.

## 2020-12-11 NOTE — Progress Notes (Signed)
A&Ox4, R/A, Up ad lib in room. SR on the monitor. Swelling to right breast (awaiting path) and generalized edema in mid-section around hips. Right chest Permacath for HD. C/O pain in areas of swelling but refuses PO pain medication. Instead request IV dilaudid Q4 hours for pain. Plan for dialysis today per Nephro at bedside. Will continue to monitor.     Filed Vitals:    12/11/20 0659 12/11/20 0730 12/11/20 1229 12/11/20 1300   BP:  (!) 164/90  128/81   Pulse:  82  75   Resp: '18 15 14 15   '$ Temp:  36.5 C (97.7 F)  36.5 C (97.7 F)   SpO2:  100%  98%       Donneta Romberg, RN  12/11/2020, 18:54

## 2020-12-11 NOTE — Care Plan (Signed)
Problem: Health Knowledge, Opportunity to Enhance (Adult,Obstetrics,Pediatric)  Intervention: Enhance Health Knowledge  Note:   Discussed smoking cessation, advised on the importance of quitting and the impact smoking has on patient's health.   Assessed willingness to quit not ready  Educated patient on methods and skills for cessation  Offered medication management  Provided information on [employer]

## 2020-12-11 NOTE — Consults (Signed)
[facility]                                                                                                   Follow Up Consult Note        Henrie Mooring  Date of service: 12/11/2020     Active Hospital Problems    Diagnosis   . Primary Problem: SVC syndrome   . ESRD (end stage renal disease) (CMS [place])   . Uncontrolled hypertension   . Tobacco dependence   . GERD (gastroesophageal reflux disease)   . Insomnia         Subjective     ALERT ,ORTHOPNEIC ,EDEMATOUS    ROS: Other than ROS in the HPI, all other systems were negative.  acetaminophen (TYLENOL) tablet, 650 mg, Oral, Q6H PRN  aspirin (ECOTRIN) enteric coated tablet 81 mg, 81 mg, Oral, Daily  carvedilol (COREG) tablet, 25 mg, Oral, 2x/day  cinacalcet (SENSIPAR) tablet, 60 mg, Oral, Daily  cloNIDine (CATAPRES) tablet, 0.1 mg, Oral, 3x/day  clopidogrel (PLAVIX) 75 mg tablet, 75 mg, Oral, Daily  cyclobenzaprine (FLEXERIL) tablet, 5 mg, Oral, Q8H PRN  diazePAM (VALIUM) tablet, 5 mg, Oral, Q6H PRN  dilTIAZem (CARDIZEM) tablet, 120 mg, Oral, 3x/day  doxazosin (CARDURA) tablet, 4 mg, Oral, QPM  ergocalciferol-vitamin D2 (DRISDOL) capsule, 50,000 Units, Oral, Q7 Days  furosemide (LASIX) tablet, 80 mg, Oral, 2x/day  hydrALAZINE (APRESOLINE) tablet, 100 mg, Oral, 3x/day  HYDROmorphone (DILAUDID) 0.5 mg/0.5 mL injection, 0.4 mg, Intravenous, Q4H PRN  hydrOXYzine pamoate (VISTARIL) capsule, 25 mg, Oral, HS PRN  ipratropium-albuterol 0.5 mg-3 mg(2.5 mg base)/3 mL Solution for Nebulization, 3 mL, Nebulization, Q6H PRN  isosorbide mononitrate (IMDUR) 24 hr extended release tablet, 60 mg, Oral, QAM  [Held by provider] labetalol (NORMODYNE) tablet, 200 mg, Oral, 3x/day  loratadine (CLARITIN) tablet, 10 mg, Oral, Daily  losartan (COZAAR) tablet, 50 mg, Oral, Daily  montelukast (SINGULAIR) 10 mg tablet, 10 mg, Oral, QPM  nicotine (NICODERM CQ) transdermal patch (mg/24  hr), 14 mg, Transdermal, Daily  NS flush syringe, 3 mL, Intracatheter, Q8HRS  NS flush syringe, 3 mL, Intracatheter, Q1H PRN  ondansetron (ZOFRAN) 2 mg/mL injection, 4 mg, Intravenous, Q6H PRN  oxyCODONE-acetaminophen (PERCOCET) 5-'325mg'$  per tablet, 1 Tablet, Oral, Q4H PRN  pantoprazole (PROTONIX) delayed release tablet, 40 mg, Oral, Daily  polyethylene glycol (MIRALAX) oral packet, 17 g, Oral, Daily  sennosides-docusate sodium (SENOKOT-S) 8.6-'50mg'$  per tablet, 1 Tablet, Oral, 2x/day  sevelamer carbonate (RENVELA) tablet, 2,400 mg, Oral, 3x/day-Meals  spironolactone (ALDACTONE) tablet, 25 mg, Oral, Daily        Allergies   Allergen Reactions   . Lisinopril Swelling       Objective     Vital Signs:  Temp (24hrs) Max:36.9 C (99991111 F)      Systolic (123XX123), [id number] , Min:119 , [id number]     Diastolic (123XX123), 123456, Min:70, Max:99    Temp  Avg: 36.7 C (98 F)  Min: 36.5 C (97.7 F)  Max: 36.9 C (98.4 F)  MAP (Non-Invasive)  Avg: 105.8 mmHG  Min: 85 mmHG  Max: 114 mmHG  Pulse  Avg: 80.5  Min: 71  Max: 89  Resp  Avg: 18.6  Min: 15  Max: 20  SpO2  Avg: 100 %  Min: 100 %  Max: 100 %     Fi02    I/O:  I/O last 24 hours:      Intake/Output Summary (Last 24 hours) at 12/11/2020 1302  Last data filed at 12/11/2020 0024  Gross per 24 hour   Intake 720 ml   Output 3300 ml   Net -2580 ml     I/O current shift:  No intake/output data recorded.  Blood Sugars: Last Fingerstick:  No results found for: GLUCOSEPOC    Physical Exam:  Constitutional:  appears chronically ill, no distress and vital signs reviewed  Respiratory:  decreased breath sounds bibasilar  EXT 1+ PEDAL EDEMA    Labs:  BMP:    BMP (Last 24 Hours):  Recent Results last 24 hours     12/11/20  0542   SODIUM 132*   POTASSIUM 4.9   CHLORIDE 93*   CO2 27   BUN 35*   CREATININE 7.43*   CALCIUM 8.7*   GLUCOSENF 94     CBC Results Differential Results   Recent Labs     12/11/20  0542   WBC 11.0   HGB 8.8*   HCT 28.2*   PLTCNT 258    Recent Results (from the past 30 hour(s))    CBC WITH DIFF    Collection Time: 12/11/20  5:42 AM   Result Value    WBC 11.0    NEUTROPHIL % 72    MONOCYTE % 11    BASOPHIL % 0    BASOPHIL # <0.10          Radiology:      Microbiology:        Assessment/ Plan:   HYPERKALEMIA RESOLVED WITH DIALYSIS  FLUID OVERLOAD DIALYZE TODAY  ESRD HD        Donny Pique, MD

## 2020-12-11 NOTE — Progress Notes (Signed)
[facility]  Hospitalist Progress Note    [name]       45 y.o.       Date of service: 12/11/2020  Date of Admission:  12/04/2020    Hospital Day:  LOS: 7 days   CC: SVC syndrome    Subjective:  Patient seen sitting up in bed complaining of feeling fluid overloaded.  States that her dry weight is 84 kg and now she has 101 K g, she has been undergoing hemodialysis per home schedule.  She also complains of right breast pain, incision site for biopsy looks clean.  Pathology is pending.  She was instructed to have her mammogram and ultrasound done and were recommended at least 3 months after the ultrasound done on this admission.     Objective:   Filed Vitals:    12/11/20 0243 12/11/20 0349 12/11/20 0659 12/11/20 0730   BP:  (!) 151/99  (!) 164/90   Pulse:  80  82   Resp: '20 18 18 15   '$ Temp:  36.7 C (98.1 F)  36.5 C (97.7 F)   SpO2:  100%  100%     O2 delivery: None (Room Air)     I have reviewed the vitals.      Input/Output    Intake/Output Summary (Last 24 hours) at 12/11/2020 0947  Last data filed at 12/11/2020 0024  Gross per 24 hour   Intake 720 ml   Output 3300 ml   Net -2580 ml         acetaminophen (TYLENOL) tablet, 650 mg, Oral, Q6H PRN  aspirin (ECOTRIN) enteric coated tablet 81 mg, 81 mg, Oral, Daily  carvedilol (COREG) tablet, 25 mg, Oral, 2x/day  cinacalcet (SENSIPAR) tablet, 60 mg, Oral, Daily  cloNIDine (CATAPRES) tablet, 0.1 mg, Oral, 3x/day  clopidogrel (PLAVIX) 75 mg tablet, 75 mg, Oral, Daily  cyclobenzaprine (FLEXERIL) tablet, 5 mg, Oral, Q8H PRN  diazePAM (VALIUM) tablet, 5 mg, Oral, Q6H PRN  dilTIAZem (CARDIZEM) tablet, 120 mg, Oral, 3x/day  doxazosin (CARDURA) tablet, 4 mg, Oral, QPM  ergocalciferol-vitamin D2 (DRISDOL) capsule, 50,000 Units, Oral, Q7 Days  furosemide (LASIX) tablet, 80 mg, Oral, 2x/day  hydrALAZINE (APRESOLINE) tablet, 100 mg, Oral, 3x/day  HYDROmorphone (DILAUDID) 0.5 mg/0.5 mL injection, 0.4 mg, Intravenous, Q4H PRN  hydrOXYzine pamoate (VISTARIL)  capsule, 25 mg, Oral, HS PRN  ipratropium-albuterol 0.5 mg-3 mg(2.5 mg base)/3 mL Solution for Nebulization, 3 mL, Nebulization, Q6H PRN  isosorbide mononitrate (IMDUR) 24 hr extended release tablet, 60 mg, Oral, QAM  [Held by provider] labetalol (NORMODYNE) tablet, 200 mg, Oral, 3x/day  loratadine (CLARITIN) tablet, 10 mg, Oral, Daily  losartan (COZAAR) tablet, 50 mg, Oral, Daily  montelukast (SINGULAIR) 10 mg tablet, 10 mg, Oral, QPM  nicotine (NICODERM CQ) transdermal patch (mg/24 hr), 14 mg, Transdermal, Daily  NS flush syringe, 3 mL, Intracatheter, Q8HRS  NS flush syringe, 3 mL, Intracatheter, Q1H PRN  ondansetron (ZOFRAN) 2 mg/mL injection, 4 mg, Intravenous, Q6H PRN  oxyCODONE-acetaminophen (PERCOCET) 5-'325mg'$  per tablet, 1 Tablet, Oral, Q4H PRN  pantoprazole (PROTONIX) delayed release tablet, 40 mg, Oral, Daily  polyethylene glycol (MIRALAX) oral packet, 17 g, Oral, Daily  sennosides-docusate sodium (SENOKOT-S) 8.6-'50mg'$  per tablet, 1 Tablet, Oral, 2x/day  sevelamer carbonate (RENVELA) tablet, 2,400 mg, Oral, 3x/day-Meals  spironolactone (ALDACTONE) tablet, 25 mg, Oral, Daily        Physical Exam:  General: No acute distress, In bed  HEENT:  Atraumatic normocephalic, trach midline, supple neck  Cardiac: RRR  Chest: R breast  swelling. Permcath to R chest wall  Respiratory: CTA  Abdomen: Distended along R abdomen.   Extremities: R arm > L arm. 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     CBC (Last 24 Hours):    Recent Results last 24 hours     12/11/20  0542   WBC 11.0   HGB 8.8*   HCT 28.2*   MCV 92.5   PLTCNT 258         BMP (Last 24 Hours):    Recent Results last 24 hours     12/11/20  0542   SODIUM 132*   POTASSIUM 4.9   CHLORIDE 93*   CO2 27   BUN 35*   CREATININE 7.43*   CALCIUM 8.7*   GLUCOSENF 94           I have reviewed all labs.    Micro: No results found for any visits on 12/04/20 (from the past 96 hour(s)).    Radiology:         PT/OT: No    Consults: Vascular Surgery, Nephrology, Gen  Surgery    Assessment/ Plan:   Active Hospital Problems    Diagnosis   . Primary Problem: SVC syndrome   . ESRD (end stage renal disease) (CMS [place])   . Uncontrolled hypertension   . Tobacco dependence   . GERD (gastroesophageal reflux disease)   . Insomnia     Possible SVC Syndrome  - Outside CTA w/ concern for moderate narrowing of the distal R subclavian vein at its junction  - Had significant R sided swelling-now resolved  - Vascular Surgery consult appreciated  - s/p Right venogram and balloon angioplasty of R innominate vein. Has some back pain post procedure but otherwise tolerated well.   -swelling improved however breast continues to be swollen  - Gen Surg consult for breast swelling.   -underwent breast biopsy 12/08/2020, biopsy report pending  -right breast ultrasound showed mastitis with no focal fluid collection, recommended mammogram and ultrasound in 3 months.  Skin Biopsies still pending.    Resistant hypertension  - Reports difficult to control hypertension and on multiple anti-hypertensive including clonidine PO and patch, coreg, labetalol, Cardizem, Cardura, lasix, Imdur, losartan and aldactone. Patient has all her bottles in the room with her.   -increased Coreg to 25 mg b.i.d. from 12.5  - Can continue home meds for now and adjust as needed. Will appreciate nephro assistance in adjusting  - Monitor BP  - Telemetry    Leukocytosis  - Completed 7 days of antibiotics at outside hospital  - Continue to monitor and hold off on Abx.   - Suspecting reactive    ESRD  Hyponatremia and hyperkalemia  - Diagnosed in 2010. Permcath placed 2021  - Nephro consult for HD  - Continue cinacalcet + renelva  -continue hemodialysis    Anemia  - Hb stable, likely 2/2 to CKD    GERD  - Continue PPI    Tobacco dependence  - Nicotine patch    Asthma/COPD  - Not in exacerbation  - nebs prn  - Continue Singulair       DVT/PE Prophylaxis:  Will discontinue heparin and start SCDs, patient refusing heparin    Disposition  Planning: pending     Marissa Nestle, MD

## 2020-12-11 NOTE — Care Plan (Signed)
Problem: Adult Inpatient Plan of Care  Goal: Plan of Care Review  Outcome: Ongoing (see interventions/notes)  Goal: Absence of Hospital-Acquired Illness or Injury  Outcome: Ongoing (see interventions/notes)  Intervention: Identify and Manage Fall Risk  Flowsheets  Taken 12/11/2020 1631  Safety Promotion/Fall Prevention:   activity supervised   safety round/check completed  Taken 12/11/2020 1600  Safety Promotion/Fall Prevention:   nonskid shoes/slippers when out of bed   safety round/check completed  Taken 12/11/2020 1400  Safety Promotion/Fall Prevention:   nonskid shoes/slippers when out of bed   safety round/check completed  Taken 12/11/2020 1200  Safety Promotion/Fall Prevention:   safety round/check completed   nonskid shoes/slippers when out of bed  Taken 12/11/2020 1000  Safety Promotion/Fall Prevention:   safety round/check completed   nonskid shoes/slippers when out of bed  Taken 12/11/2020 0800  Safety Promotion/Fall Prevention:   safety round/check completed   nonskid shoes/slippers when out of bed   fall prevention program maintained  Intervention: Prevent Skin Injury  Flowsheets  Taken 12/11/2020 1631  Body Position:   legs elevated   positioned with 2 assist  Skin Protection: tubing/devices free from skin contact  Taken 12/11/2020 0800  Body Position: sitting  Skin Protection: adhesive use limited  Intervention: Prevent and Manage VTE (Venous Thromboembolism) Risk  Recent Flowsheet Documentation  Taken 12/11/2020 1352 by Donneta Romberg, RN  VTE Prevention/Management: sequential compression devices off  Taken 12/11/2020 0800 by Donneta Romberg, RN  VTE Prevention/Management:   ambulation promoted   dorsiflexion/plantar flexion performed  Goal: Optimal Comfort and Wellbeing  Outcome: Ongoing (see interventions/notes)  Intervention: Provide Person-Centered Care  Recent Flowsheet Documentation  Taken 12/11/2020 0800 by Donneta Romberg, [place] Relationship/Rapport: care explained     Problem:  Fall Injury Risk  Goal: Absence of Fall and Fall-Related Injury  Outcome: Ongoing (see interventions/notes)  Intervention: Identify and Manage Contributors  Flowsheets  Taken 12/11/2020 1631  Self-Care Promotion: independence encouraged  Taken 12/11/2020 0800  Self-Care Promotion: independence encouraged  Intervention: Promote Injury-Free Environment  Recent Flowsheet Documentation  Taken 12/11/2020 1631 by Donneta Romberg, RN  Safety Promotion/Fall Prevention:   activity supervised   safety round/check completed  Taken 12/11/2020 1600 by Donneta Romberg, RN  Safety Promotion/Fall Prevention:   nonskid shoes/slippers when out of bed   safety round/check completed  Taken 12/11/2020 1400 by Donneta Romberg, RN  Safety Promotion/Fall Prevention:   nonskid shoes/slippers when out of bed   safety round/check completed  Taken 12/11/2020 1200 by Donneta Romberg, RN  Safety Promotion/Fall Prevention:   safety round/check completed   nonskid shoes/slippers when out of bed  Taken 12/11/2020 1000 by Donneta Romberg, RN  Safety Promotion/Fall Prevention:   safety round/check completed   nonskid shoes/slippers when out of bed  Taken 12/11/2020 0800 by Donneta Romberg, RN  Safety Promotion/Fall Prevention:   safety round/check completed   nonskid shoes/slippers when out of bed   fall prevention program maintained

## 2020-12-12 LAB — SURGICAL PATHOLOGY SPECIMEN

## 2020-12-12 MED ORDER — HEPARIN (PORCINE) 1,000 UNIT/ML INJECTION SOLUTION
3600.0000 [IU] | INTRAMUSCULAR | Status: AC
Start: 2020-12-12 — End: 2020-12-12
  Administered 2020-12-12: 3600 [IU]

## 2020-12-12 NOTE — Progress Notes (Signed)
[facility]  Surgery Progress Note    Dawn, Malone, 45 y.o. female  Date of Service: 12/12/2020  Date of Birth:  Oct 03, 1975    Hospital Day:  LOS: 8 days     Procedures: 4 Days Post-Op s/p Procedure(s) (LRB):  BIOPSY BREAST INCISIONAL (Right)    Subjective/Overnight Events:  Patient sitting up in bed. She is still complaining of pain to right breast. She denies fever, chills, or nausea.     Vital Signs:  Temperature: 36.5 C (97.7 F)  Heart Rate: 80  BP (Non-Invasive): (!) 180/99  Respiratory Rate: 15  SpO2: 99 %    Objective:  Lungs: Lung sounds clear to auscultation bilaterally.  Normal respiratory effort.  No wheezes, rales or rhonchi.    Heart: Regular rate and rhythm  Abdomen: Nontender, and nondistended. Bowel sounds within normal limits.  No organomegaly or masses.    Right breast enlarged. Tenderness noted along with edematous. Incision clean, dry and intact.   No intake or output data in the 24 hours ending 12/12/20 0928    Labs:  CBC Results BMP Results   Recent Labs     12/11/20  0542   WBC 11.0   HGB 8.8*   HCT 28.2*   PLTCNT 258    Recent Labs     12/10/20  0801 12/11/20  0542   SODIUM 125* 132*   POTASSIUM 6.9* 4.9   CHLORIDE 93* 93*   CO2 22 27   BUN 65* 35*   CREATININE 10.14* 7.43*   GFR 4* 6*   ANIONGAP 10 12      Cardiac Results   Other Chemistries Results   No results for input(s): CKMB, MBINDEX, BNP in the last 72 hours.    Invalid input(s): UHCEASTROPI Recent Labs     12/10/20  0801 12/11/20  0542   CALCIUM 9.0 8.7*   ALBUMIN 3.8  --    MAGNESIUM 2.4* 1.8   PHOSPHORUS 7.1*  --       Liver/Pancreas Enzyme Results Coagulation Studies     Recent Labs     12/10/20  0801   ALBUMIN 3.8    No results for input(s): INR, PROTHROMTME, APTT in the last 72 hours.     Microbiology/Pathology: No results found for any visits on 12/04/20 (from the past 96 hour(s)).  No results found for this visit on 12/04/20.    Radiology:         Assessment/ Plan:  Active Hospital Problems    Diagnosis   .  Primary Problem: SVC syndrome   . ESRD (end stage renal disease) (CMS [place])   . Uncontrolled hypertension   . Tobacco dependence   . GERD (gastroesophageal reflux disease)   . Insomnia     [name] is a 44 y.o.female with multiple medical problems and right breast swelling.     [place] on 12/09/20 showing no focal fluid collection or solid masses.   Right breast remains edematous and tender.   Patient is status post right breast skin biopsy on 12/08/20 to rule out cancer.  Pathology results negative for neoplasm. 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 No need for urgent surgical intervention at this time.   Plan to do breast elevation and symptomatic relief.     Yolonda Kida, APRN,NP-C       Attestation signed by Karl Luke, MD  12/12/2020, 17:33   I was  physically present and available for consultation from the APP that evaluated the patient; however, I did not personally evaluate the patient today in the hospital. Signature of this note does not indicate that I personally evaluated the patient or provided any input into the patient's care.      Karl Luke, MD  12/12/2020, 17:33  [facility]       Note: A portion of this documentation was generated using MMODAL (voice recognition software) and may contain syntax/voice recognition errors.

## 2020-12-12 NOTE — Care Plan (Signed)
Patient resting in bed awake most of the shift. Verbalized frustration with wanting to know what is causing her breast to swell and verbalized frustration with her fluid status overall. Medicated for pain as ordered prn. No further issues overnight. Bed in low position,call light accessible and up ad lib in room. Maurine Minister, RN  12/12/2020, 05:21     Problem: Adult Inpatient Plan of Care  Goal: Plan of Care Review  Outcome: Ongoing (see interventions/notes)  Goal: Patient-Specific Goal (Individualized)  Outcome: Ongoing (see interventions/notes)  Goal: Absence of Hospital-Acquired Illness or Injury  Outcome: Ongoing (see interventions/notes)  Intervention: Identify and Manage Fall Risk  Recent Flowsheet Documentation  Taken 12/12/2020 0518 by Maurine Minister, RN  Safety Promotion/Fall Prevention:   fall prevention program maintained   nonskid shoes/slippers when out of bed   safety round/check completed  Taken 12/12/2020 0031 by Maurine Minister, RN  Safety Promotion/Fall Prevention:   fall prevention program maintained   nonskid shoes/slippers when out of bed   safety round/check completed  Taken 12/11/2020 2029 by Maurine Minister, RN  Safety Promotion/Fall Prevention:   fall prevention program maintained   nonskid shoes/slippers when out of bed   safety round/check completed  Intervention: Prevent Skin Injury  Recent Flowsheet Documentation  Taken 12/11/2020 2029 by Maurine Minister, RN  Body Position: positioned/repositioned independently  Intervention: Prevent and Manage VTE (Venous Thromboembolism) Risk  Recent Flowsheet Documentation  Taken 12/11/2020 2029 by Maurine Minister, RN  VTE Prevention/Management: ambulation promoted  Intervention: Prevent Infection  Recent Flowsheet Documentation  Taken 12/12/2020 0518 by Maurine Minister, RN  Infection Prevention:   promote handwashing   rest/sleep promoted  Taken 12/12/2020 0031 by Maurine Minister, RN  Infection Prevention:   promote handwashing   rest/sleep promoted  Taken 12/11/2020  2029 by Maurine Minister, RN  Infection Prevention:   promote handwashing   rest/sleep promoted  Goal: Optimal Comfort and Wellbeing  Outcome: Ongoing (see interventions/notes)  Goal: Rounds/Family Conference  Outcome: Ongoing (see interventions/notes)     Problem: Fall Injury Risk  Goal: Absence of Fall and Fall-Related Injury  Outcome: Ongoing (see interventions/notes)  Intervention: Promote Injury-Free Environment  Recent Flowsheet Documentation  Taken 12/12/2020 0518 by Maurine Minister, RN  Safety Promotion/Fall Prevention:   fall prevention program maintained   nonskid shoes/slippers when out of bed   safety round/check completed  Taken 12/12/2020 0031 by Maurine Minister, RN  Safety Promotion/Fall Prevention:   fall prevention program maintained   nonskid shoes/slippers when out of bed   safety round/check completed  Taken 12/11/2020 2029 by Maurine Minister, RN  Safety Promotion/Fall Prevention:   fall prevention program maintained   nonskid shoes/slippers when out of bed   safety round/check completed     Problem: Pain Acute  Goal: Optimal Pain Control and Function  Outcome: Ongoing (see interventions/notes)     Problem: Hemodialysis  Goal: Safe, Effective Therapy Delivery  Outcome: Ongoing (see interventions/notes)  Goal: Effective Tissue Perfusion  Outcome: Ongoing (see interventions/notes)  Goal: Absence of Infection Signs and Symptoms  Outcome: Ongoing (see interventions/notes)  Intervention: Prevent or Manage Infection  Recent Flowsheet Documentation  Taken 12/12/2020 0518 by Maurine Minister, RN  Infection Prevention:   promote handwashing   rest/sleep promoted  Taken 12/12/2020 0031 by Maurine Minister, RN  Infection Prevention:   promote handwashing   rest/sleep promoted  Taken 12/11/2020 2029 by Maurine Minister, RN  Infection Prevention:   promote handwashing   rest/sleep promoted     Problem: Anxiety  Goal: Anxiety Reduction or  Resolution  Outcome: Ongoing (see interventions/notes)     Problem: Skin or Soft Tissue  Infection  Goal: Absence of Infection Signs and Symptoms  Outcome: Ongoing (see interventions/notes)  Intervention: Minimize and Manage Infection Progression  Recent Flowsheet Documentation  Taken 12/12/2020 0518 by Maurine Minister, RN  Infection Prevention:   promote handwashing   rest/sleep promoted  Taken 12/12/2020 0031 by Maurine Minister, RN  Infection Prevention:   promote handwashing   rest/sleep promoted  Taken 12/11/2020 2029 by Maurine Minister, RN  Infection Prevention:   promote handwashing   rest/sleep promoted     Problem: Health Knowledge, Opportunity to Enhance (Adult,Obstetrics,Pediatric)  Goal: Knowledgeable about Health Subject/Topic  Description: Patient will demonstrate the desired outcomes by discharge/transition of care.  Outcome: Ongoing (see interventions/notes)

## 2020-12-12 NOTE — Consults (Signed)
[facility]                                                                                                   Follow Up Consult Note        Aritza Deutschman  Date of service: 12/12/2020     Active Hospital Problems    Diagnosis   . Primary Problem: SVC syndrome   . ESRD (end stage renal disease) (CMS [place])   . Uncontrolled hypertension   . Tobacco dependence   . GERD (gastroesophageal reflux disease)   . Insomnia         Subjective     Alert in no distress    ROS: Other than ROS in the HPI, all other systems were negative.  acetaminophen (TYLENOL) tablet, 650 mg, Oral, Q6H PRN  aspirin (ECOTRIN) enteric coated tablet 81 mg, 81 mg, Oral, Daily  carvedilol (COREG) tablet, 25 mg, Oral, 2x/day  cinacalcet (SENSIPAR) tablet, 60 mg, Oral, Daily  cloNIDine (CATAPRES) tablet, 0.1 mg, Oral, 3x/day  clopidogrel (PLAVIX) 75 mg tablet, 75 mg, Oral, Daily  cyclobenzaprine (FLEXERIL) tablet, 5 mg, Oral, Q8H PRN  diazePAM (VALIUM) tablet, 5 mg, Oral, Q6H PRN  dilTIAZem (CARDIZEM) tablet, 120 mg, Oral, 3x/day  doxazosin (CARDURA) tablet, 4 mg, Oral, QPM  ergocalciferol-vitamin D2 (DRISDOL) capsule, 50,000 Units, Oral, Q7 Days  furosemide (LASIX) tablet, 80 mg, Oral, 2x/day  hydrALAZINE (APRESOLINE) tablet, 100 mg, Oral, 3x/day  HYDROmorphone (DILAUDID) 0.5 mg/0.5 mL injection, 0.4 mg, Intravenous, Q4H PRN  hydrOXYzine pamoate (VISTARIL) capsule, 25 mg, Oral, HS PRN  ipratropium-albuterol 0.5 mg-3 mg(2.5 mg base)/3 mL Solution for Nebulization, 3 mL, Nebulization, Q6H PRN  isosorbide mononitrate (IMDUR) 24 hr extended release tablet, 60 mg, Oral, QAM  [Held by provider] labetalol (NORMODYNE) tablet, 200 mg, Oral, 3x/day  loratadine (CLARITIN) tablet, 10 mg, Oral, Daily  losartan (COZAAR) tablet, 50 mg, Oral, Daily  montelukast (SINGULAIR) 10 mg tablet, 10 mg, Oral, QPM  nicotine (NICODERM CQ) transdermal patch (mg/24 hr), 14  mg, Transdermal, Daily  NS flush syringe, 3 mL, Intracatheter, Q8HRS  NS flush syringe, 3 mL, Intracatheter, Q1H PRN  ondansetron (ZOFRAN) 2 mg/mL injection, 4 mg, Intravenous, Q6H PRN  oxyCODONE-acetaminophen (PERCOCET) 5-'325mg'$  per tablet, 1 Tablet, Oral, Q4H PRN  pantoprazole (PROTONIX) delayed release tablet, 40 mg, Oral, Daily  polyethylene glycol (MIRALAX) oral packet, 17 g, Oral, Daily  sennosides-docusate sodium (SENOKOT-S) 8.6-'50mg'$  per tablet, 1 Tablet, Oral, 2x/day  sevelamer carbonate (RENVELA) tablet, 2,400 mg, Oral, 3x/day-Meals  spironolactone (ALDACTONE) tablet, 25 mg, Oral, Daily        Allergies   Allergen Reactions   . Lisinopril Swelling       Objective     Vital Signs:  Temp (24hrs) Max:36.7 C (A999333 F)      Systolic (123XX123), 123456 , Min:128 , 123XX123     Diastolic (123XX123), 123456, Min:81, Max:99    Temp  Avg: 36.6 C (97.8 F)  Min: 36.5 C (97.7 F)  Max: 36.7 C (98.1 F)  MAP (Non-Invasive)  Avg: 108.7 mmHG  Min: 96 mmHG  Max: 121 mmHG  Pulse  Avg: 77.7  Min: 75  Max: 80  Resp  Avg: 15.4  Min: 14  Max: 16  SpO2  Avg: 99 %  Min: 98 %  Max: 100 %     Fi02    I/O:  I/O last 24 hours:  No intake or output data in the 24 hours ending 12/12/20 1124  I/O current shift:  No intake/output data recorded.  Blood Sugars: Last Fingerstick:  No results found for: GLUCOSEPOC    Physical Exam:  Constitutional:  appears chronically ill, no distress and vital signs reviewed  Respiratory:  Clear to auscultation bilaterally.   Cardiovascular:  regular rate and rhythm  ext no edema    Labs:  BMP:  BMP (Last 24 Hours):  No results for input(s): SODIUM, POTASSIUM, CHLORIDE, CO2, BUN, CREATININE, CALCIUM, GLUCOSENF in the last 24 hours.    Radiology:      Microbiology:        Assessment/ Plan:   Hyperkalemia resolved  ESRD HD MWF        Donny Pique, MD

## 2020-12-12 NOTE — Progress Notes (Signed)
[facility]  Hospitalist Progress Note    [name]       45 y.o.       Date of service: 12/12/2020  Date of Admission:  12/04/2020    Hospital Day:  LOS: 8 days   CC: SVC syndrome    Subjective:  Patient seen and evaluated at the bedside in no acute distress complaining of pain states that the pain medicine is not helping.  She is on 0.4 mg of Dilaudid q.4 hours and she has Percocet 5/325 mg every 4 hours.  She was waiting to go to dialysis.  Pathology still pending    Objective:   Filed Vitals:    12/12/20 0500 12/12/20 0747 12/12/20 1042 12/12/20 1150   BP:  (!) 180/99  (!) 156/90   Pulse:  80  63   Resp: '16 15 15    '$ Temp:  36.5 C (97.7 F)     SpO2:  99%  99%     O2 delivery: None (Room Air)     I have reviewed the vitals.      Input/Output  No intake or output data in the 24 hours ending 12/12/20 1517      acetaminophen (TYLENOL) tablet, 650 mg, Oral, Q6H PRN  aspirin (ECOTRIN) enteric coated tablet 81 mg, 81 mg, Oral, Daily  carvedilol (COREG) tablet, 25 mg, Oral, 2x/day  cinacalcet (SENSIPAR) tablet, 60 mg, Oral, Daily  cloNIDine (CATAPRES) tablet, 0.1 mg, Oral, 3x/day  clopidogrel (PLAVIX) 75 mg tablet, 75 mg, Oral, Daily  cyclobenzaprine (FLEXERIL) tablet, 5 mg, Oral, Q8H PRN  diazePAM (VALIUM) tablet, 5 mg, Oral, Q6H PRN  dilTIAZem (CARDIZEM) tablet, 120 mg, Oral, 3x/day  doxazosin (CARDURA) tablet, 4 mg, Oral, QPM  ergocalciferol-vitamin D2 (DRISDOL) capsule, 50,000 Units, Oral, Q7 Days  furosemide (LASIX) tablet, 80 mg, Oral, 2x/day  hydrALAZINE (APRESOLINE) tablet, 100 mg, Oral, 3x/day  HYDROmorphone (DILAUDID) 0.5 mg/0.5 mL injection, 0.4 mg, Intravenous, Q4H PRN  hydrOXYzine pamoate (VISTARIL) capsule, 25 mg, Oral, HS PRN  ipratropium-albuterol 0.5 mg-3 mg(2.5 mg base)/3 mL Solution for Nebulization, 3 mL, Nebulization, Q6H PRN  isosorbide mononitrate (IMDUR) 24 hr extended release tablet, 60 mg, Oral, QAM  [Held by provider] labetalol (NORMODYNE) tablet, 200 mg, Oral,  3x/day  loratadine (CLARITIN) tablet, 10 mg, Oral, Daily  losartan (COZAAR) tablet, 50 mg, Oral, Daily  montelukast (SINGULAIR) 10 mg tablet, 10 mg, Oral, QPM  nicotine (NICODERM CQ) transdermal patch (mg/24 hr), 14 mg, Transdermal, Daily  NS flush syringe, 3 mL, Intracatheter, Q8HRS  NS flush syringe, 3 mL, Intracatheter, Q1H PRN  ondansetron (ZOFRAN) 2 mg/mL injection, 4 mg, Intravenous, Q6H PRN  oxyCODONE-acetaminophen (PERCOCET) 5-'325mg'$  per tablet, 1 Tablet, Oral, Q4H PRN  pantoprazole (PROTONIX) delayed release tablet, 40 mg, Oral, Daily  polyethylene glycol (MIRALAX) oral packet, 17 g, Oral, Daily  sennosides-docusate sodium (SENOKOT-S) 8.6-'50mg'$  per tablet, 1 Tablet, Oral, 2x/day  sevelamer carbonate (RENVELA) tablet, 2,400 mg, Oral, 3x/day-Meals  spironolactone (ALDACTONE) tablet, 25 mg, Oral, Daily        Physical Exam:  General: No acute distress, In bed  HEENT:  Atraumatic normocephalic, trach midline, supple neck  Cardiac: RRR  Chest: R breast swelling. Permcath to R chest wall  Respiratory: CTA  Abdomen: Distended along R abdomen.   Extremities: R arm > L arm. 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     CBC (Last 24 Hours):    No results for input(s): WBC, HGB, HCT, MCV, PLTCNT in the last 24 hours.  BMP (Last 24 Hours):    No results for input(s): SODIUM, POTASSIUM, CHLORIDE, CO2, BUN, CREATININE, CALCIUM, GLUCOSENF in the last 24 hours.        I have reviewed all labs.    Micro: No results found for any visits on 12/04/20 (from the past 96 hour(s)).    Radiology:         PT/OT: No    Consults: Vascular Surgery, Nephrology, Gen Surgery    Assessment/ Plan:   Active Hospital Problems    Diagnosis   . Primary Problem: SVC syndrome   . ESRD (end stage renal disease) (CMS [place])   . Uncontrolled hypertension   . Tobacco dependence   . GERD (gastroesophageal reflux disease)   . Insomnia     Possible SVC Syndrome  - Outside CTA w/ concern for moderate narrowing of the distal R subclavian vein at its  junction  - Had significant R sided swelling-now resolved  - Vascular Surgery consult appreciated  - s/p Right venogram and balloon angioplasty of R innominate vein. Has some back pain post procedure but otherwise tolerated well.   -swelling improved however breast continues to be swollen  - Gen Surg consult for breast swelling.   -underwent breast biopsy 12/08/2020, biopsy report pending  -right breast ultrasound showed mastitis with no focal fluid collection, recommended mammogram and ultrasound in 3 months.  Skin Biopsies still pending.  -continue pain medicine    Resistant hypertension  - Reports difficult to control hypertension and on multiple anti-hypertensive including clonidine PO and patch, coreg, labetalol, Cardizem, Cardura, lasix, Imdur, losartan and aldactone. Patient has all her bottles in the room with her.   -increased Coreg to 25 mg b.i.d. from 12.5  - Can continue home meds for now and adjust as needed. Will appreciate nephro assistance in adjusting  - Monitor BP  - Telemetry    Leukocytosis  - Completed 7 days of antibiotics at outside hospital  - Continue to monitor and hold off on Abx.   - Suspecting reactive    ESRD  Hyponatremia and hyperkalemia  - Diagnosed in 2010. Permcath placed 2021  - Nephro consult for HD  - Continue cinacalcet + renelva  -continue hemodialysis    Anemia  - Hb stable, likely 2/2 to CKD    GERD  - Continue PPI    Tobacco dependence  - Nicotine patch    Asthma/COPD  - Not in exacerbation  - nebs prn  - Continue Singulair       DVT/PE Prophylaxis:  Will discontinue heparin and start SCDs, patient refusing heparin    Disposition Planning: pending     Marissa Nestle, MD

## 2020-12-12 NOTE — Consults (Signed)
[facility]  Vascular Surgery Consult      [facility]  Follow Up Note          [name], [name], 45 y.o. 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  Patient endorses pain.  Right-sided edema improving    Objective     Temperature: 36.5 C (97.7 F)  Heart Rate: 83  BP (Non-Invasive): (!) 175/93  Respiratory Rate: 16  SpO2: 100 %  Constitutional:  no distress and vital signs reviewed  Cardiovascular:  regular rate and rhythm     Vascular    pulses 1+ throughout  Integumentary:  Skin warm and dry    Labs:  I have reviewed all lab results.  Lab Results Today:  No results found for any visits on 12/04/20 (from the past 24 hour(s)).    Imaging Studies:          Discussed with Dr. Jena Gauss, APRN,NP-C    Nurse Practitioner   [facility] and [place]  [phone] (on call physician after 5pm)  [place]        This note was partially generated using MModal Fluency Direct system, and there may be some incorrect words, spellings, and punctuation that were not noted in checking the note before saving.    I have reviewed the findings and discussed with APRN and agree with the documentation as above.

## 2020-12-12 NOTE — Care Plan (Signed)
Problem: Adult Inpatient Plan of Care  Goal: Plan of Care Review  Outcome: Ongoing (see interventions/notes)  Goal: Patient-Specific Goal (Individualized)  Outcome: Ongoing (see interventions/notes)  Goal: Absence of Hospital-Acquired Illness or Injury  Outcome: Ongoing (see interventions/notes)  Intervention: Identify and Manage Fall Risk  Recent Flowsheet Documentation  Taken 12/12/2020 2040 by Trinna Post, RN  Safety Promotion/Fall Prevention:   activity supervised   safety round/check completed   nonskid shoes/slippers when out of bed  Intervention: Prevent Skin Injury  Recent Flowsheet Documentation  Taken 12/12/2020 2040 by Trinna Post, RN  Body Position: supine, head elevated  Skin Protection: adhesive use limited  Intervention: Prevent Infection  Recent Flowsheet Documentation  Taken 12/12/2020 2040 by Trinna Post, RN  Infection Prevention: rest/sleep promoted  Goal: Optimal Comfort and Wellbeing  Outcome: Ongoing (see interventions/notes)  Intervention: Provide Person-Centered Care  Recent Flowsheet Documentation  Taken 12/12/2020 2040 by Trinna Post, RN  Trust Relationship/Rapport:   care explained   questions encouraged   thoughts/feelings acknowledged  Goal: Rounds/Family Conference  Outcome: Ongoing (see interventions/notes)     Problem: Fall Injury Risk  Goal: Absence of Fall and Fall-Related Injury  Outcome: Ongoing (see interventions/notes)  Intervention: Identify and Manage Contributors  Recent Flowsheet Documentation  Taken 12/12/2020 2040 by Trinna Post, RN  Self-Care Promotion:   independence encouraged   BADL personal objects within reach  Intervention: Promote [place] Documentation  Taken 12/12/2020 2040 by Trinna Post, RN  Safety Promotion/Fall Prevention:   activity supervised   safety round/check completed   nonskid shoes/slippers when out of bed     Problem: Pain Acute  Goal: Optimal Pain Control and Function  Outcome: Ongoing (see  interventions/notes)  Intervention: Prevent or Manage Pain  Recent Flowsheet Documentation  Taken 12/12/2020 2040 by Trinna Post, RN  Sleep/Rest Enhancement: awakenings minimized  Intervention: Optimize Psychosocial Wellbeing  Recent Flowsheet Documentation  Taken 12/12/2020 2040 by Trinna Post, RN  Diversional Activities: television  Supportive Measures:   active listening utilized   self-care encouraged     Problem: Hemodialysis  Goal: Safe, Effective Therapy Delivery  Outcome: Ongoing (see interventions/notes)  Goal: Effective Tissue Perfusion  Outcome: Ongoing (see interventions/notes)  Goal: Absence of Infection Signs and Symptoms  Outcome: Ongoing (see interventions/notes)  Intervention: Prevent or Manage Infection  Recent Flowsheet Documentation  Taken 12/12/2020 2040 by Trinna Post, RN  Fever Reduction/Comfort Measures:   lightweight clothing   lightweight bedding  Infection Prevention: rest/sleep promoted     Problem: Health Knowledge, Opportunity to Enhance (Adult,Obstetrics,Pediatric)  Goal: Knowledgeable about Health Subject/Topic  Description: Patient will demonstrate the desired outcomes by discharge/transition of care.  Outcome: Ongoing (see interventions/notes)  Intervention: Enhance Health Knowledge  Recent Flowsheet Documentation  Taken 12/12/2020 2040 by Trinna Post, RN  Family/Support System Care: self-care encouraged  Supportive Measures:   active listening utilized   self-care encouraged  Intervention: Enhance Health Knowledge  Recent Flowsheet Documentation  Taken 12/12/2020 2040 by Trinna Post, RN  Family/Support System Care: self-care encouraged  Supportive Measures:   active listening utilized   self-care encouraged

## 2020-12-12 NOTE — Nurses Notes (Signed)
Pt refusing to elevate right breast     Clemencia Course, RN

## 2020-12-12 NOTE — Nurses Notes (Signed)
Pt in my care since 1500. A&O x4. Medicated with PRN Dilaudid 1x. On room air. NSR on the monitor. Pt had dialysis today. No s&s of distress, will continue to monitor.    Clemencia Course, RN

## 2020-12-13 DIAGNOSIS — I871 Compression of vein: Principal | ICD-10-CM

## 2020-12-13 DIAGNOSIS — D638 Anemia in other chronic diseases classified elsewhere: Secondary | ICD-10-CM

## 2020-12-13 LAB — ECG 12 LEAD - ADULT
Atrial Rate: 80 {beats}/min
Calculated P Axis: 69 degrees
Calculated R Axis: 17 degrees
Calculated T Axis: 103 degrees
PR Interval: 254 ms
QRS Duration: 104 ms
QT Interval: 426 ms
QTC Calculation: 491 ms
Ventricular rate: 80 {beats}/min

## 2020-12-13 LAB — TROPONIN-I
TROPONIN I: 0.08 ng/mL (ref <=0.04)
TROPONIN I: 0.06 ng/mL (ref <=0.04)

## 2020-12-13 MED ORDER — DIPHENHYDRAMINE 50 MG CAPSULE
50.0000 mg | ORAL_CAPSULE | ORAL | Status: DC | PRN
Start: 2020-12-13 — End: 2020-12-14
  Administered 2020-12-13: 18:00:00 50 mg via ORAL
  Filled 2020-12-13: qty 1

## 2020-12-13 MED ORDER — CARVEDILOL 25 MG TABLET
25.0000 mg | ORAL_TABLET | Freq: Two times a day (BID) | ORAL | 0 refills | Status: DC
Start: 2020-12-13 — End: 2021-07-01

## 2020-12-13 MED ORDER — ERGOCALCIFEROL (VITAMIN D2) 1,250 MCG (50,000 UNIT) CAPSULE
50000.0000 [IU] | ORAL_CAPSULE | ORAL | 0 refills | Status: AC
Start: 2020-12-15 — End: 2021-01-27

## 2020-12-13 MED ORDER — SENNOSIDES 8.6 MG-DOCUSATE SODIUM 50 MG TABLET
1.0000 | ORAL_TABLET | Freq: Two times a day (BID) | ORAL | 0 refills | Status: DC
Start: 2020-12-13 — End: 2021-08-05

## 2020-12-13 MED ORDER — CINACALCET 60 MG TABLET
60.0000 mg | ORAL_TABLET | Freq: Every day | ORAL | 0 refills | Status: AC
Start: 2020-12-14 — End: 2021-01-13

## 2020-12-13 MED ORDER — OXYCODONE-ACETAMINOPHEN 5 MG-325 MG TABLET
1.0000 | ORAL_TABLET | ORAL | 0 refills | Status: DC | PRN
Start: 2020-12-13 — End: 2021-08-06

## 2020-12-13 MED ORDER — HYDRALAZINE 100 MG TABLET
100.0000 mg | ORAL_TABLET | Freq: Three times a day (TID) | ORAL | 0 refills | Status: DC
Start: 2020-12-13 — End: 2021-07-01

## 2020-12-13 MED ORDER — CLOPIDOGREL 75 MG TABLET
75.0000 mg | ORAL_TABLET | Freq: Every day | ORAL | 0 refills | Status: DC
Start: 2020-12-14 — End: 2021-07-01

## 2020-12-13 MED ORDER — SEVELAMER CARBONATE 800 MG TABLET
2400.0000 mg | ORAL_TABLET | Freq: Three times a day (TID) | ORAL | 0 refills | Status: AC
Start: 2020-12-14 — End: 2021-01-13

## 2020-12-13 MED ORDER — CYCLOBENZAPRINE 5 MG TABLET
5.0000 mg | ORAL_TABLET | Freq: Three times a day (TID) | ORAL | 0 refills | Status: AC | PRN
Start: 2020-12-13 — End: 2020-12-23

## 2020-12-13 MED ORDER — POLYETHYLENE GLYCOL 3350 17 GRAM ORAL POWDER PACKET
17.0000 g | Freq: Every day | ORAL | 0 refills | Status: DC
Start: 2020-12-14 — End: 2021-08-05

## 2020-12-13 NOTE — Care Management Notes (Signed)
Transportation for discharge to home arranged through [facility] [phone]). Trip [phone]

## 2020-12-13 NOTE — Discharge Instructions (Signed)
swelling

## 2020-12-13 NOTE — Nurses Notes (Signed)
Complaints of R breast pain. PRN dilaudid given. No s/s of distress. NSR on the monitor. Trinna Post, RN

## 2020-12-13 NOTE — Consults (Signed)
[facility]  Vascular Surgery Consult      [facility]  Follow Up Note          [name], [name], 45 y.o. 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  Patient states she is ready to go home.  Improvement in pain.  PermCath in place  Objective     Temperature: 36.1 C (97 F)  Heart Rate: 90  BP (Non-Invasive): (!) 172/100  Respiratory Rate: 16  SpO2: 100 %  Constitutional:  no distress and vital signs reviewed  Cardiovascular:  regular rate and rhythm     Vascular    pulses 1+ throughout  Integumentary:  Skin warm and dry.      Labs:  I have reviewed all lab results.  Lab Results Today:    Results for orders placed or performed during the hospital encounter of 12/04/20 (from the past 24 hour(s))   ECG 12 LEAD - ADULT   Result Value Ref Range    Ventricular rate 80 BPM    Atrial Rate 80 BPM    PR Interval 254 ms    QRS Duration 104 ms    QT Interval 426 ms    QTC Calculation 491 ms    Calculated P Axis 69 degrees    Calculated R Axis 17 degrees    Calculated T Axis 103 degrees   TROPONIN-I   Result Value Ref Range    TROPONIN I 0.08 (HH) <=0.04 ng/mL       Imaging Studies:          Discussed with Dr. Jena Gauss, APRN,NP-C    Nurse Practitioner   [facility] and [place]  [phone] (on call physician after 5pm)  [place]        This note was partially generated using MModal Fluency Direct system, and there may be some incorrect words, spellings, and  punctuation that were not noted in checking the note before saving.

## 2020-12-13 NOTE — Discharge Summary (Signed)
[place]      PATIENT NAME:  [name], [name]  MRN:  [id number]  DOB:  1975-07-16      ENCOUNTER START DATE: 12/04/2020  INPATIENT ADMISSION DATE: 12/04/2020    DISCHARGE DATE:  12/13/2020    ATTENDING PHYSICIAN: Murugu-Tawyna Pellot, *  PRIMARY CARE PHYSICIAN: Cam Hai, FNP     ADMISSION DIAGNOSIS: SVC syndrome  No chief complaint on file.    DISCHARGE DIAGNOSIS:   SVC syndrome  Resistant hypertension  End-stage renal disease on dialysis Mondays Wednesdays and Fridays  Tobacco dependence  GERD  Insomnia  Anemia of chronic disease  History of asthma/COPD  Hospital Problems) (* Primary Problem)    Diagnosis Date Noted   . *SVC syndrome 12/04/2020   . ESRD (end stage renal disease) (CMS [place]) 12/04/2020   . Uncontrolled hypertension 12/04/2020   . Tobacco dependence 12/04/2020   . GERD (gastroesophageal reflux disease) 12/04/2020   . Insomnia 12/04/2020      Resolved Hospital Problems   No resolved problems to display.     There are no active non-hospital problems to display for this patient.       DISCHARGE MEDICATIONS:     Current Discharge Medication List      START taking these medications.      Details   clopidogreL 75 mg Tablet  Commonly known as: PLAVIX  Start taking on: December 14, 2020   75 mg, Oral, DAILY  Qty: 30 Tablet  Refills: 0     cyclobenzaprine 5 mg Tablet  Commonly known as: FLEXERIL   5 mg, Oral, EVERY 8 HOURS PRN  Qty: 20 Tablet  Refills: 0     ergocalciferol (vitamin D2) 1,250 mcg (50,000 unit) Capsule  Commonly known as: DRISDOL  Start taking on: December 15, 2020   50,000 Units, Oral, EVERY 7 DAYS  Qty: 7 Capsule  Refills: 0     oxyCODONE-acetaminophen 5-325 mg Tablet  Commonly known as: PERCOCET   1 Tablet, Oral, EVERY 4 HOURS PRN  Qty: 12 Tablet  Refills: 0     polyethylene glycol 17 gram Powder in Packet  Commonly known as: MIRALAX  Start taking on: December 14, 2020   17 g, Oral, DAILY  Qty: 7 Packet  Refills: 0     sennosides-docusate sodium 8.6-50 mg  Tablet  Commonly known as: SENOKOT-S   1 Tablet, Oral, 2 TIMES DAILY  Qty: 20 Tablet  Refills: 0     sevelamer carbonate 800 mg Tablet  Commonly known as: RENVELA  Start taking on: December 14, 2020   2,400 mg, Oral, 3 TIMES DAILY WITH MEALS  Qty: 270 Tablet  Refills: 0        CONTINUE these medications which have CHANGED during your visit.      Details   carvediloL 25 mg Tablet  Commonly known as: COREG  What changed:    medication strength   how much to take   25 mg, Oral, 2 TIMES DAILY  Qty: 60 Tablet  Refills: 0     cinacalcet 60 mg Tablet  Commonly known as: SENSIPAR  Start taking on: December 14, 2020  What changed:    medication strength   how much to take   60 mg, Oral, DAILY  Qty: 30 Tablet  Refills: 0        CONTINUE these medications - NO CHANGES were made during your visit.      Details   albuterol sulfate 90 mcg/actuation oral  inhaler  Commonly known as: PROVENTIL or VENTOLIN or PROAIR   1-2 Puffs, Inhalation, EVERY 6 HOURS PRN  Refills: 0     aspirin 81 mg Tablet, Delayed Release (E.C.)  Commonly known as: ECOTRIN   81 mg, Oral, DAILY  Refills: 0     cloNIDine HCL 0.1 mg Tablet  Commonly known as: CATAPRES   0.1 mg, Oral, 3 TIMES DAILY  Refills: 0     dilTIAZem HCL 120 mg Tablet  Commonly known as: CARDIZEM   120 mg, Oral, 3 TIMES DAILY  Refills: 0     doxazosin 4 mg Tablet  Commonly known as: CARDURA   4 mg, Oral, EVERY EVENING  Refills: 0     furosemide 80 mg Tablet  Commonly known as: LASIX   80 mg, Oral, 2 TIMES DAILY  Refills: 0     hydrALAZINE 100 mg Tablet  Commonly known as: APRESOLINE   100 mg, Oral, 3 TIMES DAILY  Qty: 90 Tablet  Refills: 0     hydrOXYzine pamoate 25 mg Capsule  Commonly known as: VISTARIL   25 mg, Oral, NIGHTLY PRN  Refills: 0     isosorbide mononitrate 60 mg Tablet Sustained Release 24 hr  Commonly known as: IMDUR   60 mg, Oral, EVERY MORNING  Refills: 0     labetaloL 200 mg Tablet  Commonly known as: NORMODYNE   200 mg, Oral, 3 TIMES DAILY, 1.5 tabs  Refills: 0      loratadine 10 mg Tablet  Commonly known as: CLARITIN   10 mg, Oral, DAILY  Refills: 0     losartan 50 mg Tablet  Commonly known as: COZAAR   50 mg, Oral, DAILY  Refills: 0     montelukast 10 mg Tablet  Commonly known as: SINGULAIR   10 mg, Oral, EVERY EVENING  Refills: 0     pantoprazole 40 mg Tablet, Delayed Release (E.C.)  Commonly known as: PROTONIX   40 mg, Oral, DAILY  Refills: 0     spironolactone 25 mg Tablet  Commonly known as: ALDACTONE   25 mg, Oral, DAILY  Refills: 0            DISCHARGE INSTRUCTIONS:      DISCHARGE INSTRUCTION - MISC    Please follow-up with vascular surgeon in 1-2 weeks, the number is given so you can call and make an appointment.  Follow-up with your primary care physician in 1 week after discharge for regular follow-up in please keep your dialysis schedule.     FOLLOW-UP: VASCULAR - [facility] - PHYSICIAN OFFICE [facility] - [place], [place]     Follow-up in: 1-2 WEEKS    Reason for visit: HOSPITAL DISCHARGE    Follow-up reason: SVC syndrome with intervention       Follow-up Information     [facility] .    Specialty: Vein Clinic  Contact information:  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  Patient was admitted to [facility] where she presented with worsening right-sided swelling including her face, arm, right breast, right flank since October 4th and was being treated for what they thought was cellulitis without significant improvement.  Eventually they did a CT scan which showed narrowing at the junction  of the right subclavian vein concerning for SVC syndrome.  Patient was subsequently transferred to [facility] and [facility] of vascular surgery took patient to the OR on 10/12 and underwent venogram and balloon angioplasty.  She has continued to have hemodialysis and was being followed by nephrologist who also has been rearranging her blood pressure medication.  She has been on Lasix as well.  Our face and right her arm swelling have resolved however she has continued to have right breast and frank swelling.  General surgery was consulted and did a breast biopsy to rule out cancer due to the edema and leukocytosis and pathology came back negative for cancer.  She had a ultrasound that did not show any loculation in the breast and recommended that she does a breast ultrasound in 3 months and an mammogram once the swelling was down.  We discussed about her getting ultrasound in within 3 months and mammogram and patient said she would follow-up with PCP to make sure that she gets the mammogram and the ultrasound.  Today she complains of chest pain and her troponin was 0.08 and repeat 0.06 she reported that she always has elevated troponins and had chest pain was chronic and did not want any further workup and wanted to be discharged home.  Her blood pressure is difficult to control and she has been encouraged to continue following with the nephrologist to monitor her blood pressure medication and adjust as needed.  She has tolerated diet well and she is being discharged in stable condition.    PHYSICAL EXAM:  VITALS: BP (!) 154/94   Pulse 73   Temp 36.9 C (98.4 F)   Resp 18   Ht 1.702 m ('5\' 7"'$ )   Wt 103 kg (226 lb 13.7 oz)   SpO2 100%   BMI 35.53 kg/m   Constitutional:  No distress and alert and oriented.  Eyes:  Conjunctiva clear, Pupils equal and round.   Neck:  Supple, symmetrical, trachea midline.  Respiratory:  Clear to auscultation bilaterally, no wheezes.  Cardiovascular:  Regular rate and  rhythm, no murmurs.  Gastrointestinal:  Soft, non-tender, Bowel sounds normal, non-distended.  Musculoskeletal:  Head atraumatic and normocephalic.  Integumentary:   swollen right breast, warm to touch, right PermCath  Neurologic:  CN II - XII grossly intact, No tremor.      DOES PATIENT HAVE ADVANCED DIRECTIVES:  No, Information Offered and Given    CONDITION ON DISCHARGE: Alert and Oriented    DISCHARGE DISPOSITION:  Home discharge     Copies sent to Care Team       Relationship Specialty Notifications Start End    Deel, Leona Carry, FNP PCP - General NURSE PRACTITIONER  12/04/20     Phone: [phone] Fax: [phone]         [address] [place] [address]            Buena Irish, MD      This note may have been partially generated using MModal Fluency Direct system, and there may be some incorrect words, spellings, and punctuation that were not noted in checking the note before saving.

## 2020-12-13 NOTE — Progress Notes (Signed)
[name]  Surgery Progress Note    Malone, Dawn, 45 y.o. female  Date of Service: 12/13/2020  Date of Birth:  1975/05/14    Hospital Day:  LOS: 9 days     Procedures: 5 Days Post-Op s/p Procedure(s) (LRB):  BIOPSY BREAST INCISIONAL (Right)    Subjective/Overnight Events:  Patient is currently sitting up in bed. She is still complaining of right breast tenderness. She denies fever, chills, or nausea.     Vital Signs:  Temperature: 37 C (98.6 F)  Heart Rate: 75  BP (Non-Invasive): (!) 178/98  Respiratory Rate: 17  SpO2: 100 %    Objective:  Lungs: Lung sounds clear to auscultation bilaterally.  Normal respiratory effort.  No wheezes, rales or rhonchi.    Heart: Regular rate and rhythm  Abdomen: Nontender, and nondistended. Bowel sounds within normal limits.  No organomegaly or masses.    Right breast continues to be enlarged. Tenderness noted along with edematous.    Intake/Output Summary (Last 24 hours) at 12/13/2020 1446  Last data filed at 12/13/2020 1300  Gross per 24 hour   Intake 600 ml   Output 4000 ml   Net -3400 ml       Labs:  CBC Results BMP Results   Recent Labs     12/11/20  0542   WBC 11.0   HGB 8.8*   HCT 28.2*   PLTCNT 258    Recent Labs     12/11/20  0542   SODIUM 132*   POTASSIUM 4.9   CHLORIDE 93*   CO2 27   BUN 35*   CREATININE 7.43*   GFR 6*   ANIONGAP 12      Cardiac Results   Other Chemistries Results   No results for input(s): CKMB, MBINDEX, BNP in the last 72 hours.    Invalid input(s): UHCEASTROPI Recent Labs     12/11/20  0542   CALCIUM 8.7*   MAGNESIUM 1.8      Liver/Pancreas Enzyme Results Coagulation Studies     No results for input(s): TOTALPROTEIN, ALBUMIN, PREALBUMIN, AST, ALT, ALKPHOS, LDH, AMYLASE, LIPASE in the last 72 hours.    Invalid input(s): GGT No results for input(s): INR, PROTHROMTME, APTT in the last 72 hours.     Microbiology/Pathology: No results found for any visits on 12/04/20 (from the past 96 hour(s)).  Results for orders placed or performed during  the hospital encounter of 12/04/20   SURGICAL PATHOLOGY SPECIMEN   Result Value    Final Diagnosis      A) RIGHT BREAST SKIN, EXCISION:  - Skin with stromal edema. Negative for neoplasm. See note.    Note: Step levels and cytokeratin AE1/3 IHC is performed and is negative for carcinoma cells. Neoplasm is not identified. Clinical correlation is recommended. This case was reviewed by a second pathologist as part of the [facility].      Gross Description      A: Skin  Specimen is received in formalin in one container labeled with the patient's name and "right breast skin", and consists of a 1.3 x 0.5 x 0.2 cm tan edematous skin excision and a 0.7 x 0.4 x 0.2 cm piece of tan white soft tissue. The skin is inked green and serially sectioned. All submitted in one cassette. (RM)  Removed from patient: 12/08/2020 12:51   Placed in formalin: 12/08/2020 12:54  Cold ischemia time: <0.1 hr  Formalin fixation time: 57.1 hr (RM)  Radiology:         Assessment/ Plan:  Active Hospital Problems    Diagnosis   . Primary Problem: SVC syndrome   . ESRD (end stage renal disease) (CMS [place])   . Uncontrolled hypertension   . Tobacco dependence   . GERD (gastroesophageal reflux disease)   . Insomnia     [name] is a 45 y.o.female with multiple medical problems and right breast swelling.     [place] on 12/09/20 showing no focal fluid collection or solid masses.   Right breast continues to be edematous and tender.   Patient is s/p right breast skin biopsy on 12/08/20. Pathology negative for neoplasm.  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   Plan to do breast elevation and symptomatic relief.   Will follow but no need for urgent surgical intervention at this time.     Yolonda Kida, APRN,NP-C     Note: A portion of this documentation was generated using MMODAL (voice recognition software) and  may contain syntax/voice recognition errors.   Attestation signed by Karl Luke, MD  12/14/2020, 08:44   I was physically present and available for consultation from the APP that evaluated the patient; however, I did not personally evaluate the patient today in the hospital. Signature of this note does not indicate that I personally evaluated the patient or provided any input into the patient's care.      Karl Luke, MD  12/14/2020, 08:44  [place]

## 2020-12-13 NOTE — Consults (Signed)
[facility]                                                                                                   Follow Up Consult Note        [name]  Date of service: 12/13/2020     Active Hospital Problems    Diagnosis   . Primary Problem: SVC syndrome   . ESRD (end stage renal disease) (CMS [place])   . Uncontrolled hypertension   . Tobacco dependence   . GERD (gastroesophageal reflux disease)   . Insomnia         Subjective     Alert in no distress going home ?    ROS: Other than ROS in the HPI, all other systems were negative.  acetaminophen (TYLENOL) tablet, 650 mg, Oral, Q6H PRN  aspirin (ECOTRIN) enteric coated tablet 81 mg, 81 mg, Oral, Daily  carvedilol (COREG) tablet, 25 mg, Oral, 2x/day  cinacalcet (SENSIPAR) tablet, 60 mg, Oral, Daily  cloNIDine (CATAPRES) tablet, 0.1 mg, Oral, 3x/day  clopidogrel (PLAVIX) 75 mg tablet, 75 mg, Oral, Daily  cyclobenzaprine (FLEXERIL) tablet, 5 mg, Oral, Q8H PRN  diazePAM (VALIUM) tablet, 5 mg, Oral, Q6H PRN  dilTIAZem (CARDIZEM) tablet, 120 mg, Oral, 3x/day  doxazosin (CARDURA) tablet, 4 mg, Oral, QPM  ergocalciferol-vitamin D2 (DRISDOL) capsule, 50,000 Units, Oral, Q7 Days  furosemide (LASIX) tablet, 80 mg, Oral, 2x/day  hydrALAZINE (APRESOLINE) tablet, 100 mg, Oral, 3x/day  HYDROmorphone (DILAUDID) 0.5 mg/0.5 mL injection, 0.4 mg, Intravenous, Q4H PRN  hydrOXYzine pamoate (VISTARIL) capsule, 25 mg, Oral, HS PRN  ipratropium-albuterol 0.5 mg-3 mg(2.5 mg base)/3 mL Solution for Nebulization, 3 mL, Nebulization, Q6H PRN  isosorbide mononitrate (IMDUR) 24 hr extended release tablet, 60 mg, Oral, QAM  [Held by provider] labetalol (NORMODYNE) tablet, 200 mg, Oral, 3x/day  loratadine (CLARITIN) tablet, 10 mg, Oral, Daily  losartan (COZAAR) tablet, 50 mg, Oral, Daily  montelukast (SINGULAIR) 10 mg tablet, 10 mg, Oral, QPM  nicotine (NICODERM CQ) transdermal patch  (mg/24 hr), 14 mg, Transdermal, Daily  NS flush syringe, 3 mL, Intracatheter, Q8HRS  NS flush syringe, 3 mL, Intracatheter, Q1H PRN  ondansetron (ZOFRAN) 2 mg/mL injection, 4 mg, Intravenous, Q6H PRN  oxyCODONE-acetaminophen (PERCOCET) 5-'325mg'$  per tablet, 1 Tablet, Oral, Q4H PRN  pantoprazole (PROTONIX) delayed release tablet, 40 mg, Oral, Daily  polyethylene glycol (MIRALAX) oral packet, 17 g, Oral, Daily  sennosides-docusate sodium (SENOKOT-S) 8.6-'50mg'$  per tablet, 1 Tablet, Oral, 2x/day  sevelamer carbonate (RENVELA) tablet, 2,400 mg, Oral, 3x/day-Meals  spironolactone (ALDACTONE) tablet, 25 mg, Oral, Daily        Allergies   Allergen Reactions   . Lisinopril Swelling       Objective     Vital Signs:  Temp (24hrs) Max:36.9 C (99991111 F)      Systolic (123XX123), 123XX123 , Min:156 , Q000111Q     Diastolic (123XX123), 99991111, Min:90, Max:108    Temp  Avg: 36.6 C (97.9 F)  Min: 36.1 C (97 F)  Max: 36.9 C (98.4 F)  MAP (Non-Invasive)  Avg: 119.3 mmHG  Min: 118 mmHG  Max: 122  mmHG  Pulse  Avg: 78.2  Min: 63  Max: 90  Resp  Avg: 17.1  Min: 16  Max: 20  SpO2  Avg: 99.5 %  Min: 98 %  Max: 100 %     Fi02    I/O:  I/O last 24 hours:      Intake/Output Summary (Last 24 hours) at 12/13/2020 1104  Last data filed at 12/12/2020 1511  Gross per 24 hour   Intake --   Output 4000 ml   Net -4000 ml     I/O current shift:  No intake/output data recorded.  Blood Sugars: Last Fingerstick:  No results found for: GLUCOSEPOC    Physical Exam:  Constitutional:  appears chronically ill, no distress and vital signs reviewed  Respiratory:  Clear to auscultation bilaterally.   Cardiovascular:  regular rate and rhythm  ext no edema    Labs:  BMP:  BMP (Last 24 Hours):  No results for input(s): SODIUM, POTASSIUM, CHLORIDE, CO2, BUN, CREATININE, CALCIUM, GLUCOSENF in the last 24 hours.    Radiology:      Microbiology:        Assessment/ Plan:   Fluid overload better  ESRD HD MWF        Donny Pique, MD

## 2020-12-13 NOTE — Nurses Notes (Signed)
Patient's ride arrived this PM, spoke with cross coverage hospitalist okay to DC at this time. Patient taken down via wheelchair, no needs, patient educated to call if she needs anything. Patient discharged home with family.  AVS reviewed with patient/care giver.  A written copy of the AVS and discharge instructions was given to the patient/care giver.  Questions sufficiently answered as needed.  Patient/care giver encouraged to follow up with PCP as indicated.  In the event of an emergency, patient/care giver instructed to call 911 or go to the nearest emergency room.    Rodena Goldmann, RN

## 2020-12-27 ENCOUNTER — Encounter (INDEPENDENT_AMBULATORY_CARE_PROVIDER_SITE_OTHER): Payer: Self-pay

## 2021-01-10 ENCOUNTER — Encounter (INDEPENDENT_AMBULATORY_CARE_PROVIDER_SITE_OTHER): Payer: Self-pay

## 2021-05-29 ENCOUNTER — Other Ambulatory Visit: Payer: Self-pay

## 2021-05-29 DIAGNOSIS — R519 Headache, unspecified: Secondary | ICD-10-CM

## 2021-05-29 DIAGNOSIS — Z992 Dependence on renal dialysis: Secondary | ICD-10-CM

## 2021-05-29 DIAGNOSIS — N186 End stage renal disease: Secondary | ICD-10-CM

## 2021-05-29 NOTE — ED Triage Notes (Signed)
Body aches and cough for four days. Had dialysis today. No known covid exposure.

## 2021-05-30 ENCOUNTER — Emergency Department
Admission: EM | Admit: 2021-05-30 | Discharge: 2021-05-30 | Disposition: A | Discharge status: Home / Self Care | Payer: Commercial Managed Care - PPO | Attending: Emergency Medicine | Admitting: Emergency Medicine

## 2021-05-30 DIAGNOSIS — R52 Pain, unspecified: Secondary | ICD-10-CM

## 2021-05-30 DIAGNOSIS — Z20822 Contact with and (suspected) exposure to covid-19: Secondary | ICD-10-CM | POA: Insufficient documentation

## 2021-05-30 DIAGNOSIS — N186 End stage renal disease: Secondary | ICD-10-CM | POA: Insufficient documentation

## 2021-05-30 DIAGNOSIS — R9431 Abnormal electrocardiogram [ECG] [EKG]: Secondary | ICD-10-CM | POA: Insufficient documentation

## 2021-05-30 DIAGNOSIS — R519 Headache, unspecified: Secondary | ICD-10-CM | POA: Insufficient documentation

## 2021-05-30 DIAGNOSIS — Z992 Dependence on renal dialysis: Secondary | ICD-10-CM | POA: Insufficient documentation

## 2021-05-30 LAB — CBC WITH DIFF
BASOPHIL #: 0.04 10*3/uL (ref 0.00–2.50)
BASOPHIL %: 0 % (ref 0–3)
EOSINOPHIL #: 0.13 10*3/uL (ref 0.00–2.40)
EOSINOPHIL %: 1 % (ref 0–7)
HCT: 33.9 % — ABNORMAL LOW (ref 37.0–47.0)
HGB: 11.3 g/dL — ABNORMAL LOW (ref 12.5–16.0)
LYMPHOCYTE #: 1.31 10*3/uL — ABNORMAL LOW (ref 2.10–11.00)
LYMPHOCYTE %: 15 % — ABNORMAL LOW (ref 25–45)
MCH: 31.5 pg (ref 27.0–32.0)
MCHC: 33.4 g/dL (ref 32.0–36.0)
MCV: 94.4 fL (ref 78.0–99.0)
MONOCYTE #: 0.97 10*3/uL (ref 0.00–4.10)
MONOCYTE %: 11 % (ref 0–12)
MPV: 9 fL (ref 7.4–10.4)
NEUTROPHIL #: 6.11 10*3/uL (ref 4.10–29.00)
NEUTROPHIL %: 71 % (ref 40–76)
PLATELETS: 191 10*3/uL (ref 140–440)
RBC: 3.59 10*6/uL — ABNORMAL LOW (ref 4.20–5.40)
RDW: 20.1 % — ABNORMAL HIGH (ref 11.6–14.8)
WBC: 8.6 10*3/uL (ref 4.0–10.5)

## 2021-05-30 LAB — ECG 12 LEAD
Atrial Rate: 75 {beats}/min
Calculated P Axis: 40 degrees
Calculated R Axis: 6 degrees
Calculated T Axis: 98 degrees
PR Interval: 200 ms
QRS Duration: 108 ms
QT Interval: 480 ms
QTC Calculation: 536 ms
Ventricular rate: 75 {beats}/min

## 2021-05-30 LAB — COMPREHENSIVE METABOLIC PANEL, NON-FASTING
ALBUMIN/GLOBULIN RATIO: 0.8 (ref 0.8–1.4)
ALBUMIN: 3.5 g/dL (ref 3.4–5.0)
ALKALINE PHOSPHATASE: 76 U/L (ref 46–116)
ALT (SGPT): 11 U/L (ref <=78)
ANION GAP: 9 mmol/L — ABNORMAL LOW (ref 10–20)
AST (SGOT): 15 U/L (ref 15–37)
BILIRUBIN TOTAL: 0.5 mg/dL (ref 0.2–1.0)
BUN/CREA RATIO: 3
BUN: 24 mg/dL — ABNORMAL HIGH (ref 7–18)
CALCIUM, CORRECTED: 9.6 mg/dL
CALCIUM: 9.1 mg/dL (ref 8.5–10.1)
CHLORIDE: 92 mmol/L — ABNORMAL LOW (ref 98–107)
CO2 TOTAL: 30 mmol/L (ref 21–32)
CREATININE: 7.48 mg/dL — ABNORMAL HIGH (ref 0.55–1.02)
ESTIMATED GFR: 6 mL/min/{1.73_m2} — ABNORMAL LOW (ref >59)
GLOBULIN: 4.6
GLUCOSE: 133 mg/dL — ABNORMAL HIGH (ref 74–106)
OSMOLALITY, CALCULATED: 269 mOsm/kg — ABNORMAL LOW (ref 270–290)
POTASSIUM: 3.3 mmol/L — ABNORMAL LOW (ref 3.5–5.1)
PROTEIN TOTAL: 8.1 g/dL (ref 6.4–8.2)
SODIUM: 131 mmol/L — ABNORMAL LOW (ref 136–145)

## 2021-05-30 LAB — COVID-19, FLU A/B, RSV RAPID BY PCR
INFLUENZA VIRUS TYPE A: NOT DETECTED
INFLUENZA VIRUS TYPE B: NOT DETECTED
RESPIRATORY SYNCTIAL VIRUS (RSV): NOT DETECTED
SARS-CoV-2: NOT DETECTED

## 2021-05-30 MED ORDER — PROCHLORPERAZINE MALEATE 10 MG TABLET
5.0000 mg | ORAL_TABLET | Freq: Four times a day (QID) | ORAL | Status: DC | PRN
Start: 2021-05-30 — End: 2021-05-30

## 2021-05-30 MED ORDER — ACETAMINOPHEN 325 MG TABLET
650.0000 mg | ORAL_TABLET | ORAL | Status: AC
Start: 2021-05-30 — End: 2021-05-30
  Administered 2021-05-30: 650 mg via ORAL

## 2021-05-30 MED ORDER — ACETAMINOPHEN 325 MG TABLET
ORAL_TABLET | ORAL | Status: AC
Start: 2021-05-30 — End: 2021-05-30
  Filled 2021-05-30: qty 2

## 2021-05-30 MED ORDER — MAGNESIUM OXIDE 400 MG (241.3 MG MAGNESIUM) TABLET
400.0000 mg | ORAL_TABLET | Freq: Two times a day (BID) | ORAL | Status: DC
Start: 2021-05-30 — End: 2021-05-30
  Administered 2021-05-30: 400 mg via ORAL

## 2021-05-30 MED ORDER — PROCHLORPERAZINE MALEATE 10 MG TABLET
10.0000 mg | ORAL_TABLET | Freq: Four times a day (QID) | ORAL | Status: DC | PRN
Start: 2021-05-30 — End: 2021-05-30

## 2021-05-30 MED ORDER — MAGNESIUM OXIDE 400 MG (241.3 MG MAGNESIUM) TABLET
ORAL_TABLET | ORAL | Status: AC
Start: 2021-05-30 — End: 2021-05-30
  Filled 2021-05-30: qty 1

## 2021-05-30 NOTE — ED Nurses Note (Signed)
Verbal and written instructions given, pt verbalized understanding and voiced no further concerns, pt calling friend for ride home. Instructed to call or return for any needs.

## 2021-05-30 NOTE — ED Provider Notes (Signed)
[facility], [facility] Emergency Department  ED Primary Provider Note  HPI:  [name] is a 46 y.o. female     Patient complains of 4 days of body aches and pains as well as gradual onset headache.  Endorses some mild photophobia.  No nausea no vomiting.  No neck stiffness.  Patient is ESRD dialysis dependent.  Had dialysis yesterday.  Uses dialysis cath right subclavian.  Patient is COVID vaccinated full code.    ROS review and negative aside from stated in HPI.    Physical Exam:  ED Triage Vitals [05/29/21 2140]   BP (Non-Invasive) (!) 175/125   Heart Rate 88   Respiratory Rate 20   Temperature 37.1 C (98.7 F)   SpO2 97 %   Weight 90.7 kg (200 lb)   Height 1.702 m (5' 7" )     No acute distress.  Patient awake alert oriented x3.  Mood is appropriate.  Pupils 3 mm equal round reactive.  Extraocular movements are intact.  Oropharynx is clear.  Mucous membranes moist.  Trachea midline.  Neck is supple.  Heart has regular rate and rhythm without significant murmurs rubs or gallops.  Lungs are clear to auscultation.  Abdomen soft nontender, nondistended.  Moving all extremities without difficulty.  No rash no edema.  Left subclavian dialysis catheter site is clean and dry.  There is no fluctuance there is no tenderness.  Skin overall is dry.      Patient data:  Labs Ordered/Reviewed   COMPREHENSIVE METABOLIC PANEL, NON-FASTING - Abnormal; Notable for the following components:       Result Value    SODIUM 131 (*)     POTASSIUM 3.3 (*)     CHLORIDE 92 (*)     ANION GAP 9 (*)     BUN 24 (*)     CREATININE 7.48 (*)     ESTIMATED GFR 6 (*)     GLUCOSE 133 (*)     OSMOLALITY, CALCULATED 269 (*)     All other components within normal limits    Narrative:     Estimated Glomerular Filtration Rate (eGFR) is calculated using the CKD-EPI (2021) equation, intended for patients 12 years of age and older. If gender is not documented or "unknown", there will be no eGFR calculation.   CBC WITH DIFF  - Abnormal; Notable for the following components:    RBC 3.59 (*)     HGB 11.3 (*)     HCT 33.9 (*)     RDW 20.1 (*)     LYMPHOCYTE % 15 (*)     LYMPHOCYTE # 1.31 (*)     All other components within normal limits   COVID-19, FLU A/B, RSV RAPID BY PCR - Normal    Narrative:     Results are for the simultaneous qualitative identification of SARS-CoV-2 (formerly 2019-nCoV), Influenza A, Influenza B, and RSV RNA. These etiologic agents are generally detectable in nasopharyngeal and nasal swabs during the ACUTE PHASE of infection. Hence, this test is intended to be performed on respiratory specimens collected from individuals with signs and symptoms of upper respiratory tract infection who meet Centers for Disease Control and Prevention (CDC) clinical and/or epidemiological criteria for Coronavirus Disease 2019 (COVID-19) testing. CDC COVID-19 criteria for testing on human specimens is available at [facility] webpage information for Healthcare Professionals: Coronavirus Disease 2019 (COVID-19) ([email or web address]).     False-negative results may occur if the virus has genomic mutations, insertions, deletions, or rearrangements or if  performed very early in the course of illness. Otherwise, negative results indicate virus specific RNA targets are not detected, however negative results do not preclude SARS-CoV-2 infection/COVID-19, Influenza, or Respiratory syncytial virus infection. Results should not be used as the sole basis for patient management decisions. Negative results must be combined with clinical observations, patient history, and epidemiological information. If upper respiratory tract infection is still suspected based on exposure history together with other clinical findings, re-testing should be considered.    Disclaimer:   This assay has been authorized by FDA under an Emergency Use Authorization for use in laboratories certified under the Clinical Laboratory Improvement  Amendments of 1988 (CLIA), 42 U.S.C. [phone], to perform high complexity tests. The impacts of vaccines, antiviral therapeutics, antibiotics, chemotherapeutic or immunosuppressant drugs have not been evaluated.     Test methodology:   Cepheid Xpert Xpress SARS-CoV-2/Flu/RSV Assay real-time polymerase chain reaction (RT-PCR) test on the GeneXpert Dx and Xpert Xpress systems.   CBC/DIFF    Narrative:     The following orders were created for panel order CBC/DIFF.  Procedure                               Abnormality         Status                     ---------                               -----------         ------                     CBC WITH DIFF[[id number]]                Abnormal            Final result                 Please view results for these tests on the individual orders.     No orders to display     EKG read by me shows a sinus rhythm with a rate of 75, normal axis, QTC is 536.  No significant ST or T-wave changes no delta waves are S1 Q3 T3 pattern     MDM:  Headache body aches.  DX includes migraine-type headache volume shifts from dialysis electrolyte abnormalities uremia viral illness including COVID cough.  The patient hypertensive 175/125.  Her lab workup is as expected for a patient with ESRD.  COVID negative influenza negative. EKG nonischemic no evidence of critical arrhythmia.  Patient given magnesium for a QTC correction Tylenol.  She reported improvement of symptoms. as patient to continue with the normal dialysis schedule follow-up primary care.  I gave strict return to ED instructions in both a verbal written manner.  Patient is comfortable with discharge        Discharged  Clinical Impression   Headache (Primary)   Body aches   Dialysis patient ([facility] [place])     Medications Administered in the ED   acetaminophen (TYLENOL) tablet (650 mg Oral Given 05/30/21 0113)        Current Discharge Medication List      CONTINUE these medications - NO CHANGES were made during your visit.      Details   albuterol  sulfate 90 mcg/actuation oral inhaler  Commonly  known as: PROVENTIL or VENTOLIN or PROAIR   1-2 Puffs, Inhalation, EVERY 6 HOURS PRN  Refills: 0     aspirin 81 mg Tablet, Delayed Release (E.C.)  Commonly known as: ECOTRIN   81 mg, Oral, DAILY  Refills: 0     carvediloL 25 mg Tablet  Commonly known as: COREG   25 mg, Oral, 2 TIMES DAILY  Qty: 60 Tablet  Refills: 0     cloNIDine HCL 0.1 mg Tablet  Commonly known as: CATAPRES   0.1 mg, Oral, 3 TIMES DAILY  Refills: 0     clopidogreL 75 mg Tablet  Commonly known as: PLAVIX   75 mg, Oral, DAILY  Qty: 30 Tablet  Refills: 0     dilTIAZem HCL 120 mg Tablet  Commonly known as: CARDIZEM   120 mg, Oral, 3 TIMES DAILY  Refills: 0     doxazosin 4 mg Tablet  Commonly known as: CARDURA   4 mg, Oral, EVERY EVENING  Refills: 0     furosemide 80 mg Tablet  Commonly known as: LASIX   80 mg, Oral, 2 TIMES DAILY  Refills: 0     hydrALAZINE 100 mg Tablet  Commonly known as: APRESOLINE   100 mg, Oral, 3 TIMES DAILY  Qty: 90 Tablet  Refills: 0     hydrOXYzine pamoate 25 mg Capsule  Commonly known as: VISTARIL   25 mg, Oral, NIGHTLY PRN  Refills: 0     isosorbide mononitrate 60 mg Tablet Sustained Release 24 hr  Commonly known as: IMDUR   60 mg, Oral, EVERY MORNING  Refills: 0     labetaloL 200 mg Tablet  Commonly known as: NORMODYNE   200 mg, Oral, 3 TIMES DAILY, 1.5 tabs  Refills: 0     loratadine 10 mg Tablet  Commonly known as: CLARITIN   10 mg, Oral, DAILY  Refills: 0     losartan 50 mg Tablet  Commonly known as: COZAAR   50 mg, Oral, DAILY  Refills: 0     montelukast 10 mg Tablet  Commonly known as: SINGULAIR   10 mg, Oral, EVERY EVENING  Refills: 0     oxyCODONE-acetaminophen 5-325 mg Tablet  Commonly known as: PERCOCET   1 Tablet, Oral, EVERY 4 HOURS PRN  Qty: 12 Tablet  Refills: 0     pantoprazole 40 mg Tablet, Delayed Release (E.C.)  Commonly known as: PROTONIX   40 mg, Oral, DAILY  Refills: 0     polyethylene glycol 17 gram Powder in Packet  Commonly known as: MIRALAX   17 g,  Oral, DAILY  Qty: 7 Packet  Refills: 0     sennosides-docusate sodium 8.6-50 mg Tablet  Commonly known as: SENOKOT-S   1 Tablet, Oral, 2 TIMES DAILY  Qty: 20 Tablet  Refills: 0     spironolactone 25 mg Tablet  Commonly known as: ALDACTONE   25 mg, Oral, DAILY  Refills: 0

## 2021-05-30 NOTE — Discharge Instructions (Signed)
You were seen in the  emergency department for headache and body aches.  Your evaluation did not reveal critical condition requiring hospitalization.  You may use Tylenol every 4-6 hours for symptom relief.  Continue dialysis schedule follow-up with primary care ideally within the next 2-3 days.  Return to emergency department sooner should he have any recurrent vomiting new worsening confusion or any other concerning symptoms.  Thank you for visiting [facility].

## 2021-06-04 ENCOUNTER — Emergency Department
Admission: EM | Admit: 2021-06-04 | Discharge: 2021-06-04 | Disposition: A | Discharge status: Home / Self Care | Payer: Commercial Managed Care - PPO | Attending: Emergency Medicine | Admitting: Emergency Medicine

## 2021-06-04 ENCOUNTER — Encounter (HOSPITAL_COMMUNITY): Payer: Self-pay

## 2021-06-04 ENCOUNTER — Inpatient Hospital Stay (HOSPITAL_COMMUNITY): Payer: Commercial Managed Care - PPO

## 2021-06-04 ENCOUNTER — Other Ambulatory Visit: Payer: Self-pay

## 2021-06-04 DIAGNOSIS — K5900 Constipation, unspecified: Secondary | ICD-10-CM | POA: Insufficient documentation

## 2021-06-04 DIAGNOSIS — N189 Chronic kidney disease, unspecified: Secondary | ICD-10-CM | POA: Insufficient documentation

## 2021-06-04 DIAGNOSIS — R918 Other nonspecific abnormal finding of lung field: Secondary | ICD-10-CM | POA: Insufficient documentation

## 2021-06-04 DIAGNOSIS — Z992 Dependence on renal dialysis: Secondary | ICD-10-CM | POA: Insufficient documentation

## 2021-06-04 MED ORDER — LACTULOSE 10 GRAM/15 ML ORAL SOLUTION
30.0000 mL | Freq: Every day | ORAL | 0 refills | Status: DC
Start: 2021-06-04 — End: 2021-07-01

## 2021-06-04 MED ORDER — LACTULOSE 10 GRAM/15 ML (15 ML) ORAL SOLUTION
30.0000 mL | ORAL | Status: AC
Start: 2021-06-04 — End: 2021-06-04
  Administered 2021-06-04: 30 mL via ORAL

## 2021-06-04 MED ORDER — LACTULOSE 10 GRAM/15 ML (15 ML) ORAL SOLUTION
ORAL | Status: AC
Start: 2021-06-04 — End: 2021-06-04
  Filled 2021-06-04: qty 30

## 2021-06-04 NOTE — ED Triage Notes (Signed)
Constipation x 4 days, pain with respiration, body aches, hands numb.

## 2021-06-04 NOTE — ED Provider Notes (Signed)
[facility]  ED Primary Provider Note  History of Present Illness   chief complaint    Arrival: The patient arrived by Private Vehicle         [name] is a 46 y.o. female who had concerns including Constipation.  Patient 46 year old female very well-known here to the emergency room.  She is complaining of generalized abdominal discomfort with some fracture/constipation.  Last bowel movement was several days ago.  Patient is a dialysis patient.  All nursing notes reviewed      Review of Systems     No other overt Review of Systems are noted to be positive except noted in the HPI.      Historical Data   History Reviewed This Encounter:   reviewed past medical/surgical/family/social history      Physical Exam   ED Triage Vitals [06/04/21 0210]   BP (Non-Invasive) (!) 144/100   Heart Rate 84   Respiratory Rate 20   Temperature 36.4 C (97.6 F)   SpO2 100 %   Weight 81.6 kg (180 lb)   Height 1.702 m (5\' 7" )         Exam:  Constitutional:  Patient alert orient x3 in no apparent distress.  No limitations.  Head: Atraumatic normocephalic  Eyes :  Pupils are equal round reactive to light and accommodation extraocular muscles are intact.  Sclera and conjunctiva are unremarkable  Ears:  Tympanic membranes are pearly gray bilaterally; external auditory canals are unremarkable; external ears without any lesions  Nose:  Nares are patent turbinates are pink and moist  Mouth:  Mucosa is pink and moist without lesions.  Posterior pharynx is pink and moist without hypertrophy/exudate.  Neck:  Soft and supple without palpable lymphadenopathy.  Chest:  Dialysis port right upper chest  Heart:  Regular rate and rhythm 1/6 holosystolic ejection murmur  Lungs:  Clear to auscultation bilaterally without any wheezing/rales/rhonchi  Abdomen:  Soft nontender without any rebound or guarding; positive bowel sounds throughout  Genitalia:  Deferred  Skin:  Warm and dry without lesions.  Normal skin turgor.   Brisk capillary refill distally  Extremities:  Good strenght bilaterally with full range of motion of upper and lower extremities.  Neuro:  Alert oriented x3.  Cranial nerves II-XII grossly intact as tested.  Excellent sensation distally over all dermatomes.  Psychiatric:  Patient cooperative, affect appropriate, insight and judgment good        Procedures      Patient Data   Labs Ordered/Reviewed - No data to display    No orders to display       Medical Decision Making          Medical Decision Making  Flat upright of abdomen is consistent with moderate stool throughout the ascending transverse and descending colon.  No air-fluid levels, nonobstructive bowel gas pattern.  Slight chronic congestion noted in single portable view chest.  Dialysis port in place.  Patient given dose of lactulose here in emergency room.  He will be discharged home on lactulose.  See discharge instructions for detailed    Amount and/or Complexity of Data Reviewed  Radiology: ordered and independent interpretation performed.     Details: See narrative above      Risk  Prescription drug management.      Critical Care  Total time providing critical care: 0 minutes             Medications Administered in the ED   lactulose (ENULOSE)  10g per 42mL oral liquid (has no administration in time range)       Following the history, physical exam, and ED workup, the patient was deemed stable and suitable for discharge. The patient/caregiver was advised to return to the ED for any new or worsening symptoms. Discharge medications, and follow-up instructions were discussed with the patient/caregiver in detail, who verbalizes understanding. The patient/caregiver is in agreement and is comfortable with the plan of care.    Disposition: Discharged         Current Discharge Medication List      START taking these medications.      Details   lactulose 10 gram/15 mL Solution  Commonly known as: ENULOSE   30 mL, Oral, DAILY  Qty: 90 mL  Refills: 0         CONTINUE these medications - NO CHANGES were made during your visit.      Details   albuterol sulfate 90 mcg/actuation oral inhaler  Commonly known as: PROVENTIL or VENTOLIN or PROAIR   1-2 Puffs, Inhalation, EVERY 6 HOURS PRN  Refills: 0     aspirin 81 mg Tablet, Delayed Release (E.C.)  Commonly known as: ECOTRIN   81 mg, Oral, DAILY  Refills: 0     carvediloL 25 mg Tablet  Commonly known as: COREG   25 mg, Oral, 2 TIMES DAILY  Qty: 60 Tablet  Refills: 0     cloNIDine HCL 0.1 mg Tablet  Commonly known as: CATAPRES   0.1 mg, Oral, 3 TIMES DAILY  Refills: 0     clopidogreL 75 mg Tablet  Commonly known as: PLAVIX   75 mg, Oral, DAILY  Qty: 30 Tablet  Refills: 0     dilTIAZem HCL 120 mg Tablet  Commonly known as: CARDIZEM   120 mg, Oral, 3 TIMES DAILY  Refills: 0     doxazosin 4 mg Tablet  Commonly known as: CARDURA   4 mg, Oral, EVERY EVENING  Refills: 0     furosemide 80 mg Tablet  Commonly known as: LASIX   80 mg, Oral, 2 TIMES DAILY  Refills: 0     hydrALAZINE 100 mg Tablet  Commonly known as: APRESOLINE   100 mg, Oral, 3 TIMES DAILY  Qty: 90 Tablet  Refills: 0     hydrOXYzine pamoate 25 mg Capsule  Commonly known as: VISTARIL   25 mg, Oral, NIGHTLY PRN  Refills: 0     isosorbide mononitrate 60 mg Tablet Sustained Release 24 hr  Commonly known as: IMDUR   60 mg, Oral, EVERY MORNING  Refills: 0     labetaloL 200 mg Tablet  Commonly known as: NORMODYNE   200 mg, Oral, 3 TIMES DAILY, 1.5 tabs  Refills: 0     loratadine 10 mg Tablet  Commonly known as: CLARITIN   10 mg, Oral, DAILY  Refills: 0     losartan 50 mg Tablet  Commonly known as: COZAAR   50 mg, Oral, DAILY  Refills: 0     montelukast 10 mg Tablet  Commonly known as: SINGULAIR   10 mg, Oral, EVERY EVENING  Refills: 0     oxyCODONE-acetaminophen 5-325 mg Tablet  Commonly known as: PERCOCET   1 Tablet, Oral, EVERY 4 HOURS PRN  Qty: 12 Tablet  Refills: 0     pantoprazole 40 mg Tablet, Delayed Release (E.C.)  Commonly known as: PROTONIX   40 mg, Oral,  DAILY  Refills: 0     polyethylene glycol 17 gram  Powder in Packet  Commonly known as: MIRALAX   17 g, Oral, DAILY  Qty: 7 Packet  Refills: 0     sennosides-docusate sodium 8.6-50 mg Tablet  Commonly known as: SENOKOT-S   1 Tablet, Oral, 2 TIMES DAILY  Qty: 20 Tablet  Refills: 0     spironolactone 25 mg Tablet  Commonly known as: ALDACTONE   25 mg, Oral, DAILY  Refills: 0          Follow up:   Cam Hai, FNP  [address]  [place] [address]  [phone]      Follow-up with family doctor for recheck in 48-72 hours                 Clinical Impression   Constipation, unspecified constipation type (Primary)         Current Discharge Medication List      START taking these medications    Details   lactulose (ENULOSE) 10 gram/15 mL Oral Solution Take 30 mL by mouth Once a day for 3 days  Qty: 90 mL, Refills: 0             R.A. Baldwin [place], DO  Department of Emergency Medicine

## 2021-06-04 NOTE — Discharge Instructions (Signed)
Take lactulose twice a day as prescribed for the next 3 days  Follow-up with family doctor for recheck in 2-3 days  Slightly increase your fluid intake  Return to emergency room for any increased pain, shortness of breath, or any concerns

## 2021-06-04 NOTE — ED Nurses Note (Signed)
Pt awaiting transportation home at this time.

## 2021-06-09 ENCOUNTER — Emergency Department (HOSPITAL_BASED_OUTPATIENT_CLINIC_OR_DEPARTMENT_OTHER): Payer: Commercial Managed Care - PPO

## 2021-06-09 ENCOUNTER — Emergency Department
Admission: EM | Admit: 2021-06-09 | Discharge: 2021-06-09 | Discharge status: Against Medical Advice | Payer: Commercial Managed Care - PPO | Attending: FAMILY PRACTICE | Admitting: FAMILY PRACTICE

## 2021-06-09 ENCOUNTER — Encounter (HOSPITAL_BASED_OUTPATIENT_CLINIC_OR_DEPARTMENT_OTHER): Payer: Self-pay

## 2021-06-09 ENCOUNTER — Other Ambulatory Visit: Payer: Self-pay

## 2021-06-09 DIAGNOSIS — Z20822 Contact with and (suspected) exposure to covid-19: Secondary | ICD-10-CM | POA: Insufficient documentation

## 2021-06-09 DIAGNOSIS — Z5329 Procedure and treatment not carried out because of patient's decision for other reasons: Secondary | ICD-10-CM | POA: Insufficient documentation

## 2021-06-09 DIAGNOSIS — R9431 Abnormal electrocardiogram [ECG] [EKG]: Secondary | ICD-10-CM | POA: Insufficient documentation

## 2021-06-09 DIAGNOSIS — R0602 Shortness of breath: Secondary | ICD-10-CM

## 2021-06-09 DIAGNOSIS — I150 Renovascular hypertension: Secondary | ICD-10-CM | POA: Insufficient documentation

## 2021-06-09 DIAGNOSIS — Z992 Dependence on renal dialysis: Secondary | ICD-10-CM | POA: Insufficient documentation

## 2021-06-09 DIAGNOSIS — I10 Essential (primary) hypertension: Secondary | ICD-10-CM

## 2021-06-09 DIAGNOSIS — R519 Headache, unspecified: Secondary | ICD-10-CM

## 2021-06-09 LAB — COMPREHENSIVE METABOLIC PANEL, NON-FASTING
ALBUMIN/GLOBULIN RATIO: 0.7 — ABNORMAL LOW (ref 0.8–1.4)
ALBUMIN: 3.4 g/dL (ref 3.4–5.0)
ALKALINE PHOSPHATASE: 83 U/L (ref 46–116)
ALT (SGPT): 9 U/L (ref <=78)
ANION GAP: 14 mmol/L (ref 10–20)
AST (SGOT): 17 U/L (ref 15–37)
BILIRUBIN TOTAL: 0.6 mg/dL (ref 0.2–1.0)
BUN/CREA RATIO: 3
BUN: 31 mg/dL — ABNORMAL HIGH (ref 7–18)
CALCIUM, CORRECTED: 10.4 mg/dL
CALCIUM: 9.8 mg/dL (ref 8.5–10.1)
CHLORIDE: 93 mmol/L — ABNORMAL LOW (ref 98–107)
CO2 TOTAL: 27 mmol/L (ref 21–32)
CREATININE: 10.55 mg/dL — ABNORMAL HIGH (ref 0.55–1.02)
ESTIMATED GFR: 4 mL/min/{1.73_m2} — ABNORMAL LOW (ref >59)
GLOBULIN: 4.8
GLUCOSE: 85 mg/dL (ref 74–106)
OSMOLALITY, CALCULATED: 274 mOsm/kg (ref 270–290)
POTASSIUM: 3.9 mmol/L (ref 3.5–5.1)
PROTEIN TOTAL: 8.2 g/dL (ref 6.4–8.2)
SODIUM: 134 mmol/L — ABNORMAL LOW (ref 136–145)

## 2021-06-09 LAB — COVID-19, FLU A/B, RSV RAPID BY PCR
INFLUENZA VIRUS TYPE A: NOT DETECTED
INFLUENZA VIRUS TYPE B: NOT DETECTED
RESPIRATORY SYNCTIAL VIRUS (RSV): NOT DETECTED
SARS-CoV-2: NOT DETECTED

## 2021-06-09 LAB — CBC WITH DIFF
BASOPHIL #: 0.1 10*3/uL (ref 0.00–0.30)
BASOPHIL %: 1 % (ref 0–3)
EOSINOPHIL #: 0.3 10*3/uL (ref 0.00–0.80)
EOSINOPHIL %: 2 % (ref 0–7)
HCT: 31.4 % — ABNORMAL LOW (ref 37.0–47.0)
HGB: 10.5 g/dL — ABNORMAL LOW (ref 12.5–16.0)
LYMPHOCYTE #: 1 10*3/uL — ABNORMAL LOW (ref 1.10–5.00)
LYMPHOCYTE %: 8 % — ABNORMAL LOW (ref 25–45)
MCH: 30.7 pg (ref 27.0–32.0)
MCHC: 33.6 g/dL (ref 32.0–36.0)
MCV: 91.5 fL (ref 78.0–99.0)
MONOCYTE #: 0.8 10*3/uL (ref 0.00–1.30)
MONOCYTE %: 6 % (ref 0–12)
MPV: 6.8 fL — ABNORMAL LOW (ref 7.4–10.4)
NEUTROPHIL #: 10.6 10*3/uL — ABNORMAL HIGH (ref 1.80–8.40)
NEUTROPHIL %: 83 % — ABNORMAL HIGH (ref 40–76)
PLATELETS: 303 10*3/uL (ref 140–440)
RBC: 3.43 10*6/uL — ABNORMAL LOW (ref 4.20–5.40)
RDW: 20 % — ABNORMAL HIGH (ref 11.6–14.8)
WBC: 12.7 10*3/uL — ABNORMAL HIGH (ref 4.0–10.5)
WBCS UNCORRECTED: 12.7 10*3/uL

## 2021-06-09 LAB — ECG 12 LEAD
Atrial Rate: 75 {beats}/min
Calculated P Axis: 75 degrees
Calculated R Axis: -6 degrees
Calculated T Axis: 110 degrees
PR Interval: 202 ms
QRS Duration: 102 ms
QT Interval: 472 ms
QTC Calculation: 527 ms
Ventricular rate: 75 {beats}/min

## 2021-06-09 LAB — D-DIMER: D-DIMER: 1414 ng/mL DDU (ref <=232)

## 2021-06-09 LAB — LACTIC ACID LEVEL W/ REFLEX FOR LEVEL >2.0: LACTIC ACID: 0.6 mmol/L (ref 0.4–2.0)

## 2021-06-09 MED ORDER — HYDRALAZINE 20 MG/ML INJECTION SOLUTION
10.0000 mg | INTRAMUSCULAR | Status: AC
Start: 2021-06-09 — End: 2021-06-09
  Administered 2021-06-09: 10 mg via INTRAVENOUS

## 2021-06-09 MED ORDER — HYDRALAZINE 20 MG/ML INJECTION SOLUTION
INTRAMUSCULAR | Status: AC
Start: 2021-06-09 — End: 2021-06-09
  Filled 2021-06-09: qty 1

## 2021-06-09 MED ORDER — ACETAMINOPHEN 325 MG TABLET
650.0000 mg | ORAL_TABLET | ORAL | Status: AC
Start: 2021-06-09 — End: 2021-06-09
  Administered 2021-06-09: 650 mg via ORAL

## 2021-06-09 MED ORDER — ACETAMINOPHEN 325 MG TABLET
ORAL_TABLET | ORAL | Status: AC
Start: 2021-06-09 — End: 2021-06-09
  Filled 2021-06-09: qty 2

## 2021-06-09 NOTE — ED Triage Notes (Signed)
Shortness of breath started two days ago and worse this am.  EMS gave breathing treatment.

## 2021-06-09 NOTE — ED Provider Notes (Signed)
[facility], [facility] Emergency Department  ED Primary Provider Note  History of Present Illness   Chief Complaint   Patient presents with   . Shortness of Breath     [name] is a 46 y.o. female who had concerns including Shortness of Breath.  Arrival: The patient arrived by Ambulance    This 46 year old female patient presents emergency department complaints of shortness of breath, and her blood pressures greatly out of control today.  She is in the emergency department with multiple episodes of uncontrolled hypertension, various complaints including chest pain, or shortness of breath.  Initial blood pressure here is 180/118, she denies palpitations, tachycardia, nausea, vomiting, joint aches or body aches.  She does have a slight headache.  She states she is not missing dialysis.        Review of Systems   Pertinent positive and negative ROS as per HPI.  Historical Data   History Reviewed This Encounter:   Physical Exam   ED Triage Vitals [06/09/21 0909]   BP (Non-Invasive) (!) 180/118   Heart Rate 73   Respiratory Rate (!) 22   Temperature 35.7 C (96.3 F)   SpO2 100 %   Weight 83.9 kg (185 lb)   Height 1.702 m (_0 )     Physical Exam   General:  No acute distress, nontoxic   Ears:  TMs are clear without fluid or retraction currently   Nasal:  Patent bilaterally, pink moist   Oral: Slightly dry   Pharynx:  Slightly dry, erythematous   Neck:  Supple without accessory muscle use to breathe no JVD.  Trachea is in midline   Lungs:  Slightly distant symmetrical bilaterally with faint scattered expiratory rhonchi   Heart: Regular rate rhythm no murmur gallop   Abdomen:  Soft normal bowel sounds nontender   Extremities:  Moving symmetrically.  Skin:  No new or suspicious rashes or lesions.    Patient Data     Labs Ordered/Reviewed   COMPREHENSIVE METABOLIC PANEL, NON-FASTING - Abnormal; Notable for the following components:       Result Value    SODIUM 134 (*)     CHLORIDE 93  (*)     BUN 31 (*)     CREATININE 10.55 (*)     ESTIMATED GFR 4 (*)     ALBUMIN/GLOBULIN RATIO 0.7 (*)     All other components within normal limits    Narrative:     Estimated Glomerular Filtration Rate (eGFR) is calculated using the CKD-EPI (2021) equation, intended for patients 58 years of age and older. If gender is not documented or "unknown", there will be no eGFR calculation.   D-DIMER - Abnormal; Notable for the following components:    D-DIMER 1,414 (*)     All other components within normal limits   CBC WITH DIFF - Abnormal; Notable for the following components:    WBC 12.7 (*)     RBC 3.43 (*)     HGB 10.5 (*)     HCT 31.4 (*)     RDW 20.0 (*)     MPV 6.8 (*)     NEUTROPHIL % 83 (*)     LYMPHOCYTE % 8 (*)     NEUTROPHIL # 10.60 (*)     LYMPHOCYTE # 1.00 (*)     All other components within normal limits   LACTIC ACID LEVEL W/ REFLEX FOR LEVEL >2.0 - Normal   COVID-19, FLU A/B, RSV RAPID BY  PCR - Normal    Narrative:     Results are for the simultaneous qualitative identification of SARS-CoV-2 (formerly 2019-nCoV), Influenza A, Influenza B, and RSV RNA. These etiologic agents are generally detectable in nasopharyngeal and nasal swabs during the ACUTE PHASE of infection. Hence, this test is intended to be performed on respiratory specimens collected from individuals with signs and symptoms of upper respiratory tract infection who meet Centers for Disease Control and Prevention (CDC) clinical and/or epidemiological criteria for Coronavirus Disease 2019 (COVID-19) testing. CDC COVID-19 criteria for testing on human specimens is available at [facility] webpage information for Healthcare Professionals: Coronavirus Disease 2019 (COVID-19) ([email or web address]).     False-negative results may occur if the virus has genomic mutations, insertions, deletions, or rearrangements or if performed very early in the course of illness. Otherwise, negative results indicate virus specific RNA  targets are not detected, however negative results do not preclude SARS-CoV-2 infection/COVID-19, Influenza, or Respiratory syncytial virus infection. Results should not be used as the sole basis for patient management decisions. Negative results must be combined with clinical observations, patient history, and epidemiological information. If upper respiratory tract infection is still suspected based on exposure history together with other clinical findings, re-testing should be considered.    Disclaimer:   This assay has been authorized by FDA under an Emergency Use Authorization for use in laboratories certified under the Clinical Laboratory Improvement Amendments of 1988 (CLIA), 42 U.S.C. [phone], to perform high complexity tests. The impacts of vaccines, antiviral therapeutics, antibiotics, chemotherapeutic or immunosuppressant drugs have not been evaluated.     Test methodology:   Cepheid Xpert Xpress SARS-CoV-2/Flu/RSV Assay real-time polymerase chain reaction (RT-PCR) test on the GeneXpert Dx and Xpert Xpress systems.   CBC/DIFF    Narrative:     The following orders were created for panel order CBC/DIFF.  Procedure                               Abnormality         Status                     ---------                               -----------         ------                     CBC WITH DIFF[[id number]]                Abnormal            Final result                 Please view results for these tests on the individual orders.   TROPONIN-I     No orders to display     Medical Decision Making        MDM  ED Course as of 06/09/21 1437   Sun Jun 09, 2021   1126 Patient resting seen evaluated, feeling better, her systolic blood pressure is near with her usual baseline round, however, her diastolic blood pressure is still 115 which is elevated.  She will be given hydralazine IV, blood pressure monitor.  EKG was nondiagnostic.   1433 Patient blood pressure still elevated, no further dyspnea or other complaints except  your complaints.  She was given  Tylenol for mild headache earlier, which resolved.  Patient is refusing further labs, he is signing out [place].         Medications Administered in the ED   acetaminophen (TYLENOL) tablet (650 mg Oral Given 06/09/21 1008)   hydrALAZINE (APRESOLINE) injection 10 mg (10 mg Intravenous Given 06/09/21 1245)     Clinical Impression   Shortness of breath (Primary)   Uncontrolled hypertension   Renovascular hypertension       Disposition: AMA    .Marland KitchenBretta Bang, DO  06/09/2021, 14:38

## 2021-06-09 NOTE — ED Nurses Note (Signed)
Patient states upon entering room "I'm tiared of this thing squeezing my arm (referring to b/p cuff). I can take all the medication in the world and its not going to help my blood pressure. I've told ya and told ya. I just want to leave and go home. I'll go to my regular doctor tomorrow". Patient sign AMA papers. IV discontinued with cathertrt  tip intact. Site clear dressing applied. Patient ambulated off unit

## 2021-06-23 ENCOUNTER — Emergency Department
Admission: EM | Admit: 2021-06-23 | Discharge: 2021-06-23 | Discharge status: Against Medical Advice | Payer: Commercial Managed Care - PPO | Attending: Emergency Medicine | Admitting: Emergency Medicine

## 2021-06-23 ENCOUNTER — Encounter (HOSPITAL_BASED_OUTPATIENT_CLINIC_OR_DEPARTMENT_OTHER): Payer: Self-pay

## 2021-06-23 ENCOUNTER — Emergency Department (HOSPITAL_BASED_OUTPATIENT_CLINIC_OR_DEPARTMENT_OTHER): Payer: Commercial Managed Care - PPO

## 2021-06-23 ENCOUNTER — Other Ambulatory Visit: Payer: Self-pay

## 2021-06-23 DIAGNOSIS — J811 Chronic pulmonary edema: Secondary | ICD-10-CM | POA: Insufficient documentation

## 2021-06-23 DIAGNOSIS — F17210 Nicotine dependence, cigarettes, uncomplicated: Secondary | ICD-10-CM | POA: Insufficient documentation

## 2021-06-23 DIAGNOSIS — I120 Hypertensive chronic kidney disease with stage 5 chronic kidney disease or end stage renal disease: Secondary | ICD-10-CM | POA: Insufficient documentation

## 2021-06-23 DIAGNOSIS — Z5329 Procedure and treatment not carried out because of patient's decision for other reasons: Secondary | ICD-10-CM | POA: Insufficient documentation

## 2021-06-23 DIAGNOSIS — I132 Hypertensive heart and chronic kidney disease with heart failure and with stage 5 chronic kidney disease, or end stage renal disease: Secondary | ICD-10-CM

## 2021-06-23 DIAGNOSIS — R079 Chest pain, unspecified: Secondary | ICD-10-CM | POA: Insufficient documentation

## 2021-06-23 DIAGNOSIS — R778 Other specified abnormalities of plasma proteins: Secondary | ICD-10-CM

## 2021-06-23 DIAGNOSIS — N186 End stage renal disease: Secondary | ICD-10-CM | POA: Insufficient documentation

## 2021-06-23 DIAGNOSIS — Z992 Dependence on renal dialysis: Secondary | ICD-10-CM | POA: Insufficient documentation

## 2021-06-23 DIAGNOSIS — I10 Essential (primary) hypertension: Secondary | ICD-10-CM

## 2021-06-23 DIAGNOSIS — I440 Atrioventricular block, first degree: Secondary | ICD-10-CM | POA: Insufficient documentation

## 2021-06-23 LAB — MANUAL DIFFERENTIAL
LYMPHOCYTE %: 6 % — ABNORMAL LOW (ref 25–45)
LYMPHOCYTE ABSOLUTE: 0.66 10*3/uL — ABNORMAL LOW (ref 1.10–5.00)
LYMPHOCYTES MANUAL: 6
MONOCYTE %: 4 % (ref 0–12)
MONOCYTE ABSOLUTE: 0.44 10*3/uL (ref 0.00–1.30)
MONOCYTES MANUAL: 4
NEUTROPHIL %: 90 % — ABNORMAL HIGH (ref 40–76)
NEUTROPHIL ABSOLUTE: 9.9 10*3/uL — ABNORMAL HIGH (ref 1.80–8.40)
NEUTROPHILS MANUAL: 90
PLATELET MORPHOLOGY COMMENT: NORMAL
TOTAL CELLS COUNTED [#] IN BLOOD: 100
WBC: 11 10*3/uL

## 2021-06-23 LAB — BASIC METABOLIC PANEL
ANION GAP: 16 mmol/L (ref 10–20)
BUN/CREA RATIO: 4
BUN: 57 mg/dL — ABNORMAL HIGH (ref 7–18)
CALCIUM: 10.8 mg/dL — ABNORMAL HIGH (ref 8.5–10.1)
CHLORIDE: 96 mmol/L — ABNORMAL LOW (ref 98–107)
CO2 TOTAL: 24 mmol/L (ref 21–32)
CREATININE: 14.07 mg/dL — ABNORMAL HIGH (ref 0.55–1.02)
ESTIMATED GFR: 3 mL/min/{1.73_m2} — ABNORMAL LOW (ref >59)
GLUCOSE: 93 mg/dL (ref 74–106)
OSMOLALITY, CALCULATED: 288 mOsm/kg (ref 270–290)
POTASSIUM: 4.2 mmol/L (ref 3.5–5.1)
SODIUM: 136 mmol/L (ref 136–145)

## 2021-06-23 LAB — CBC WITH DIFF
HCT: 25.9 % — ABNORMAL LOW (ref 37.0–47.0)
HGB: 9.2 g/dL — ABNORMAL LOW (ref 12.5–16.0)
MCH: 33 pg — ABNORMAL HIGH (ref 27.0–32.0)
MCHC: 35.6 g/dL (ref 32.0–36.0)
MCV: 92.9 fL (ref 78.0–99.0)
MPV: 7.6 fL (ref 7.4–10.4)
PLATELETS: 276 10*3/uL (ref 140–440)
RBC: 2.79 10*6/uL — ABNORMAL LOW (ref 4.20–5.40)
RDW: 19.5 % — ABNORMAL HIGH (ref 11.6–14.8)
WBC: 11 10*3/uL — ABNORMAL HIGH (ref 4.0–10.5)

## 2021-06-23 LAB — TROPONIN-I
TROPONIN I: 64 ng/L — ABNORMAL HIGH (ref <15)
TROPONIN I: 78 ng/L — ABNORMAL HIGH (ref <15)
TROPONIN I: 94 ng/L — ABNORMAL HIGH (ref <15)

## 2021-06-23 LAB — PT/INR
INR: 1.21 — ABNORMAL HIGH (ref 0.88–1.10)
PROTHROMBIN TIME: 12.9 seconds — ABNORMAL HIGH (ref 9.2–12.1)

## 2021-06-23 MED ORDER — HYDRALAZINE 50 MG TABLET
100.0000 mg | ORAL_TABLET | ORAL | Status: AC
Start: 2021-06-23 — End: 2021-06-23
  Administered 2021-06-23: 100 mg via ORAL

## 2021-06-23 MED ORDER — ASPIRIN 81 MG CHEWABLE TABLET
324.0000 mg | CHEWABLE_TABLET | ORAL | Status: DC
Start: 2021-06-23 — End: 2021-06-23

## 2021-06-23 MED ORDER — HYDROMORPHONE 2 MG/ML INJECTION WRAPPER
0.5000 mg | INJECTION | INTRAMUSCULAR | Status: AC
Start: 2021-06-23 — End: 2021-06-23
  Administered 2021-06-23: 0.5 mg via INTRAVENOUS

## 2021-06-23 MED ORDER — FUROSEMIDE 10 MG/ML INJECTION SOLUTION
INTRAMUSCULAR | Status: AC
Start: 2021-06-23 — End: 2021-06-23
  Filled 2021-06-23: qty 8

## 2021-06-23 MED ORDER — HYDRALAZINE 50 MG TABLET
ORAL_TABLET | ORAL | Status: AC
Start: 2021-06-23 — End: 2021-06-23
  Filled 2021-06-23: qty 2

## 2021-06-23 MED ORDER — NITROGLYCERIN 2 % TRANSDERMAL OINTMENT - PACKET
TOPICAL_OINTMENT | TRANSDERMAL | Status: AC
Start: 2021-06-23 — End: 2021-06-23
  Filled 2021-06-23: qty 1

## 2021-06-23 MED ORDER — NITROGLYCERIN 0.4 MG SUBLINGUAL TABLET
0.4000 mg | SUBLINGUAL_TABLET | SUBLINGUAL | Status: AC
Start: 2021-06-23 — End: 2021-06-23
  Administered 2021-06-23: 0.4 mg via SUBLINGUAL

## 2021-06-23 MED ORDER — NITROGLYCERIN 2 % TRANSDERMAL OINTMENT - PACKET
0.5000 [in_us] | TOPICAL_OINTMENT | TRANSDERMAL | Status: AC
Start: 2021-06-23 — End: 2021-06-23
  Administered 2021-06-23: 0.5 [in_us] via TOPICAL

## 2021-06-23 MED ORDER — FUROSEMIDE 10 MG/ML INJECTION SOLUTION
80.0000 mg | INTRAMUSCULAR | Status: AC
Start: 2021-06-23 — End: 2021-06-23
  Administered 2021-06-23: 80 mg via INTRAVENOUS

## 2021-06-23 MED ORDER — HYDRALAZINE 20 MG/ML INJECTION SOLUTION
20.0000 mg | INTRAMUSCULAR | Status: AC
Start: 2021-06-23 — End: 2021-06-23
  Administered 2021-06-23: 20 mg via INTRAVENOUS

## 2021-06-23 MED ORDER — HYDRALAZINE 20 MG/ML INJECTION SOLUTION
INTRAMUSCULAR | Status: AC
Start: 2021-06-23 — End: 2021-06-23
  Filled 2021-06-23: qty 1

## 2021-06-23 MED ORDER — NITROGLYCERIN 0.4 MG SUBLINGUAL TABLET
SUBLINGUAL_TABLET | SUBLINGUAL | Status: AC
Start: 2021-06-23 — End: 2021-06-23
  Filled 2021-06-23: qty 1

## 2021-06-23 MED ORDER — NITROGLYCERIN 0.4 MG SUBLINGUAL TABLET
0.4000 mg | SUBLINGUAL_TABLET | SUBLINGUAL | 0 refills | Status: DC
Start: 2021-06-23 — End: 2021-10-03

## 2021-06-23 MED ORDER — ONDANSETRON HCL (PF) 4 MG/2 ML INJECTION SOLUTION
INTRAMUSCULAR | Status: AC
Start: 2021-06-23 — End: 2021-06-23
  Filled 2021-06-23: qty 2

## 2021-06-23 MED ORDER — ONDANSETRON HCL (PF) 4 MG/2 ML INJECTION SOLUTION
4.0000 mg | INTRAMUSCULAR | Status: AC
Start: 2021-06-23 — End: 2021-06-23
  Administered 2021-06-23: 4 mg via INTRAVENOUS

## 2021-06-23 MED ORDER — ASPIRIN 81 MG CHEWABLE TABLET
CHEWABLE_TABLET | ORAL | Status: AC
Start: 2021-06-23 — End: 2021-06-23
  Filled 2021-06-23: qty 4

## 2021-06-23 MED ORDER — HYDROMORPHONE 2 MG/ML INJECTION WRAPPER
INJECTION | INTRAMUSCULAR | Status: AC
Start: 2021-06-23 — End: 2021-06-23
  Filled 2021-06-23: qty 1

## 2021-06-23 NOTE — ED Nurses Note (Addendum)
Patient states chest pain that radiates down to the left arm, describes pain has a cramping sensation. Provider made aware.

## 2021-06-23 NOTE — ED Nurses Note (Addendum)
Patient states pain after 2nd nitroglycerin has lessened and feeling slightly better. Patient refuses third nitroglycerin 0.4. Provider made aware.

## 2021-06-23 NOTE — ED Triage Notes (Addendum)
Per EMS, patient c/o midsternal chest pain that radiates into the left arm for "days". Patient also c/o non-productive cough. Has not had hemodialysis since Wednesday. EMS gave 324 mg ASA and NTG x 1 with no change.

## 2021-06-23 NOTE — ED Attending Note (Signed)
[place] emergency department         HISTORY OF PRESENT ILLNESS     Date:  06/23/2021  Patient's Name:  [name]  Date of Birth:  [birth year]    Patient is a 46 year old with history of end-stage renal disease on hemodialysis patient was last dialyzed on Wednesday patient missed dialysis on Friday.  Patient states she has had midsternal chest pain for the past week worse tonight prompting her to come to the emergency room.  Patient denies shortness of breath she has had a nonproductive cough.  Patient has had no history of previous MI with stents patient has a right subclavian dialysis catheter in place.          Review of Systems     Review of Systems   Constitutional: Negative.    HENT: Negative.    Respiratory: Positive for cough.    Cardiovascular: Positive for chest pain.   Musculoskeletal: Negative.    Neurological: Negative.    Hematological: Negative.    Psychiatric/Behavioral: Negative.    All other systems reviewed and are negative.      Previous History     Past Medical History:  Past Medical History:   Diagnosis Date   . Asthma    . Congestive heart failure (CMS HCC)    . COPD (chronic obstructive pulmonary disease) (CMS HCC)    . Esophageal reflux    . ESRD (end stage renal disease) (CMS [place])    . HTN (hypertension)    . Sleep apnea        Past Surgical History:  Past Surgical History:   Procedure Laterality Date   . Hx back surgery     . Hx cholecystectomy     . Hx hysterectomy     . Hx tonsillectomy         Social History:  Social History     Tobacco Use   . Smoking status: Every Day     Packs/day: 0.50     Types: Cigarettes   . Smokeless tobacco: Never   Vaping Use   . Vaping Use: Never used   Substance Use Topics   . Alcohol use: Not Currently   . Drug use: Yes     Types: Marijuana     Social History     Substance and Sexual Activity   Drug Use Yes   . Types: Marijuana       Family History:  No family history on file.    Medication History:  Current  Outpatient Medications   Medication Sig   . albuterol sulfate (PROVENTIL OR VENTOLIN OR PROAIR) 90 mcg/actuation Inhalation oral inhaler Take 1-2 Puffs by inhalation Every 6 hours as needed   . aspirin (ECOTRIN) 81 mg Oral Tablet, Delayed Release (E.C.) Take 1 Tablet (81 mg total) by mouth Once a day   . carvediloL (COREG) 25 mg Oral Tablet Take 1 Tablet (25 mg total) by mouth Twice daily for 30 days   . cloNIDine HCL (CATAPRES) 0.1 mg Oral Tablet Take 1 Tablet (0.1 mg total) by mouth in the morning and 1 Tablet (0.1 mg total) at noon and 1 Tablet (0.1 mg total) before bedtime.   . clopidogreL (PLAVIX) 75 mg Oral Tablet Take 1 Tablet (75 mg total) by mouth Once a day for 30 days   . dilTIAZem HCL (CARDIZEM) 120 mg Oral Tablet Take 1 Tablet (120 mg total) by mouth in the morning and 1 Tablet (120  mg total) at noon and 1 Tablet (120 mg total) before bedtime.   Marland Kitchen doxazosin (CARDURA) 4 mg Oral Tablet Take 1 Tablet (4 mg total) by mouth Every evening   . furosemide (LASIX) 80 mg Oral Tablet Take 1 Tablet (80 mg total) by mouth in the morning and 1 Tablet (80 mg total) in the evening.   . hydrALAZINE (APRESOLINE) 100 mg Oral Tablet Take 1 Tablet (100 mg total) by mouth Three times a day for 30 days   . hydrOXYzine pamoate (VISTARIL) 25 mg Oral Capsule Take 1 Capsule (25 mg total) by mouth Every night as needed for Itching   . isosorbide mononitrate (IMDUR) 60 mg Oral Tablet Sustained Release 24 hr Take 1 Tablet (60 mg total) by mouth Every morning   . labetaloL (NORMODYNE) 200 mg Oral Tablet Take 1 Tablet (200 mg total) by mouth in the morning and 1 Tablet (200 mg total) at noon and 1 Tablet (200 mg total) before bedtime. 1.5 tabs.   Marland Kitchen loratadine (CLARITIN) 10 mg Oral Tablet Take 1 Tablet (10 mg total) by mouth Once a day   . losartan (COZAAR) 50 mg Oral Tablet Take 1 Tablet (50 mg total) by mouth Once a day   . montelukast (SINGULAIR) 10 mg Oral Tablet Take 1 Tablet (10 mg total) by mouth Every evening   .  oxyCODONE-acetaminophen (PERCOCET) 5-325 mg Oral Tablet Take 1 Tablet by mouth Every 4 hours as needed   . pantoprazole (PROTONIX) 40 mg Oral Tablet, Delayed Release (E.C.) Take 1 Tablet (40 mg total) by mouth Once a day   . polyethylene glycol (MIRALAX) 17 gram Oral Powder in Packet Take 1 Packet (17 g total) by mouth Once a day   . sennosides-docusate sodium (SENOKOT-S) 8.6-50 mg Oral Tablet Take 1 Tablet by mouth Twice daily   . spironolactone (ALDACTONE) 25 mg Oral Tablet Take 1 Tablet (25 mg total) by mouth Once a day       Allergies:  Allergies   Allergen Reactions   . Lisinopril Swelling       Physical Exam     Vitals:    Pulse 88   Temp 36.8 C (98.2 F)   Resp 18   Ht 1.676 m (5\' 6" )   Wt 90.7 kg (200 lb)   SpO2 96%   BMI 32.28 kg/m           Physical Exam  Vitals and nursing note reviewed.   Constitutional:       General: She is not in acute distress.     Appearance: She is well-developed.   HENT:      Head: Normocephalic and atraumatic.   Eyes:      Conjunctiva/sclera: Conjunctivae normal.   Cardiovascular:      Rate and Rhythm: Normal rate and regular rhythm.      Heart sounds: No murmur heard.  Pulmonary:      Effort: Pulmonary effort is normal. No respiratory distress.      Breath sounds: Normal breath sounds.   Abdominal:      Palpations: Abdomen is soft.      Tenderness: There is no abdominal tenderness.   Musculoskeletal:         General: No swelling.      Cervical back: Neck supple.   Skin:     General: Skin is warm and dry.      Capillary Refill: Capillary refill takes less than 2 seconds.   Neurological:      Mental  Status: She is alert.   Psychiatric:         Mood and Affect: Mood normal.         Diagnostic Studies/Treatment     Medications:  Medications Administered in the ED   nitroGLYCERIN (NITRO-BID) 2 % topical ointment (has no administration in time range)   hydrALAZINE (APRESOLINE) injection 20 mg (has no administration in time range)   furosemide (LASIX) 10 mg/mL injection (has no  administration in time range)       New Prescriptions    No medications on file       Labs:    No results found for this or any previous visit (from the past 12 hour(s)).     Radiology:  None    No orders to display       ECG:   Normal sinus rhythm 81 beats per minute PR interval 210 nonspecific ST-T changes            Differential diagnosis  Uncontrolled hypertension noncardiac chest pain acute renal failure    Course/Disposition/Plan     Course:        Disposition:    Data Unavailable    Condition at Disposition:        Follow up:   No follow-up provider specified.    Clinical Impression:     Clinical Impression   Chest pain, unspecified type (Primary)   Hypertension, uncontrolled   End-stage renal disease (ESRD) (CMS HCC)         Winfred Burn, MD

## 2021-06-23 NOTE — ED Nurses Note (Signed)
Patient states she missed dialysis on Friday, was SOB then. Patient states that chest pain x7 days, and has progressively gotten worst. 7 out of 10.

## 2021-06-23 NOTE — ED Nurses Note (Signed)
Patient awaiting ride to sign out AMA, provider aware.

## 2021-06-23 NOTE — ED Nurses Note (Signed)
Patient states chest pain has lessened after first  nitroglycerin 0.4 , but still there. Per provider's instruction 2nd dose of  0.4 nitroglycerin given at 0308.

## 2021-06-24 ENCOUNTER — Emergency Department
Admission: EM | Admit: 2021-06-24 | Discharge: 2021-06-24 | Disposition: A | Discharge status: Home / Self Care | Payer: Commercial Managed Care - PPO | Attending: Emergency Medicine | Admitting: Emergency Medicine

## 2021-06-24 ENCOUNTER — Other Ambulatory Visit: Payer: Self-pay

## 2021-06-24 ENCOUNTER — Emergency Department (HOSPITAL_COMMUNITY): Payer: Commercial Managed Care - PPO

## 2021-06-24 DIAGNOSIS — R079 Chest pain, unspecified: Secondary | ICD-10-CM

## 2021-06-24 DIAGNOSIS — F17210 Nicotine dependence, cigarettes, uncomplicated: Secondary | ICD-10-CM

## 2021-06-24 DIAGNOSIS — I440 Atrioventricular block, first degree: Secondary | ICD-10-CM

## 2021-06-24 DIAGNOSIS — I871 Compression of vein: Secondary | ICD-10-CM | POA: Insufficient documentation

## 2021-06-24 DIAGNOSIS — R0600 Dyspnea, unspecified: Secondary | ICD-10-CM

## 2021-06-24 DIAGNOSIS — R9431 Abnormal electrocardiogram [ECG] [EKG]: Secondary | ICD-10-CM | POA: Insufficient documentation

## 2021-06-24 DIAGNOSIS — Z992 Dependence on renal dialysis: Secondary | ICD-10-CM | POA: Insufficient documentation

## 2021-06-24 DIAGNOSIS — E8770 Fluid overload, unspecified: Secondary | ICD-10-CM | POA: Insufficient documentation

## 2021-06-24 DIAGNOSIS — Z91158 Patient's noncompliance with renal dialysis for other reason: Secondary | ICD-10-CM

## 2021-06-24 DIAGNOSIS — I10 Essential (primary) hypertension: Secondary | ICD-10-CM | POA: Diagnosis present

## 2021-06-24 DIAGNOSIS — R778 Other specified abnormalities of plasma proteins: Secondary | ICD-10-CM | POA: Insufficient documentation

## 2021-06-24 DIAGNOSIS — N186 End stage renal disease: Secondary | ICD-10-CM | POA: Insufficient documentation

## 2021-06-24 DIAGNOSIS — I509 Heart failure, unspecified: Secondary | ICD-10-CM | POA: Insufficient documentation

## 2021-06-24 DIAGNOSIS — I132 Hypertensive heart and chronic kidney disease with heart failure and with stage 5 chronic kidney disease, or end stage renal disease: Secondary | ICD-10-CM | POA: Insufficient documentation

## 2021-06-24 LAB — COMPREHENSIVE METABOLIC PANEL, NON-FASTING
ALBUMIN/GLOBULIN RATIO: 1.2 (ref 0.8–1.4)
ALBUMIN: 4 g/dL (ref 3.5–5.7)
ALKALINE PHOSPHATASE: 71 U/L (ref 34–104)
ALT (SGPT): <7 U/L — ABNORMAL LOW (ref 7–52)
ANION GAP: 17 mmol/L (ref 10–20)
AST (SGOT): 13 U/L (ref 13–39)
BILIRUBIN TOTAL: 0.8 mg/dL (ref 0.3–1.2)
BUN/CREA RATIO: 4 — ABNORMAL LOW (ref 6–22)
BUN: 65 mg/dL — ABNORMAL HIGH (ref 7–25)
CALCIUM, CORRECTED: 10.2 mg/dL (ref 8.9–10.8)
CALCIUM: 10.2 mg/dL (ref 8.6–10.3)
CHLORIDE: 94 mmol/L — ABNORMAL LOW (ref 98–107)
CO2 TOTAL: 23 mmol/L (ref 21–31)
CREATININE: 15.14 mg/dL — ABNORMAL HIGH (ref 0.60–1.30)
ESTIMATED GFR: 3 mL/min/{1.73_m2} — ABNORMAL LOW (ref >59)
GLOBULIN: 3.4 (ref 2.9–5.4)
GLUCOSE: 85 mg/dL (ref 74–109)
OSMOLALITY, CALCULATED: 286 mOsm/kg (ref 270–290)
POTASSIUM: 4.2 mmol/L (ref 3.5–5.1)
PROTEIN TOTAL: 7.4 g/dL (ref 6.4–8.9)
SODIUM: 134 mmol/L — ABNORMAL LOW (ref 136–145)

## 2021-06-24 LAB — ECG 12 LEAD
Atrial Rate: 81 {beats}/min
Calculated P Axis: 68 degrees
Calculated R Axis: 2 degrees
Calculated T Axis: 79 degrees
PR Interval: 210 ms
QRS Duration: 104 ms
QT Interval: 408 ms
QTC Calculation: 473 ms
Ventricular rate: 81 {beats}/min

## 2021-06-24 LAB — CBC WITH DIFF
BASOPHIL #: 0.2 10*3/uL (ref 0.00–0.30)
BASOPHIL %: 1 % (ref 0–3)
EOSINOPHIL #: 0.4 10*3/uL (ref 0.00–0.80)
EOSINOPHIL %: 3 % (ref 0–7)
HCT: 27.4 % — ABNORMAL LOW (ref 37.0–47.0)
HGB: 9.2 g/dL — ABNORMAL LOW (ref 12.5–16.0)
LYMPHOCYTE #: 0.9 10*3/uL — ABNORMAL LOW (ref 1.10–5.00)
LYMPHOCYTE %: 8 % — ABNORMAL LOW (ref 25–45)
MCH: 30.5 pg (ref 27.0–32.0)
MCHC: 33.6 g/dL (ref 32.0–36.0)
MCV: 90.8 fL (ref 78.0–99.0)
MONOCYTE #: 0.9 10*3/uL (ref 0.00–1.30)
MONOCYTE %: 8 % (ref 0–12)
MPV: 6.7 fL — ABNORMAL LOW (ref 7.4–10.4)
NEUTROPHIL #: 9 10*3/uL — ABNORMAL HIGH (ref 1.80–8.40)
NEUTROPHIL %: 80 % — ABNORMAL HIGH (ref 40–76)
PLATELETS: 268 10*3/uL (ref 140–440)
RBC: 3.02 10*6/uL — ABNORMAL LOW (ref 4.20–5.40)
RDW: 19.8 % — ABNORMAL HIGH (ref 11.6–14.8)
WBC: 11.3 10*3/uL — ABNORMAL HIGH (ref 4.0–10.5)
WBCS UNCORRECTED: 11.3 10*3/uL

## 2021-06-24 LAB — GRAY TOP TUBE

## 2021-06-24 LAB — TROPONIN-I
TROPONIN I: 91 ng/L — ABNORMAL HIGH (ref <15)
TROPONIN I: 89 ng/L — ABNORMAL HIGH (ref <15)
TROPONIN I: 98 ng/L — ABNORMAL HIGH (ref <15)

## 2021-06-24 LAB — BLUE TOP TUBE

## 2021-06-24 MED ORDER — MORPHINE 4 MG/ML INJECTION WRAPPER
4.0000 mg | INJECTION | INTRAMUSCULAR | Status: DC
Start: 2021-06-24 — End: 2021-06-24

## 2021-06-24 MED ORDER — DIPHENHYDRAMINE 25 MG CAPSULE
ORAL_CAPSULE | ORAL | Status: AC
Start: 2021-06-24 — End: 2021-06-24
  Filled 2021-06-24: qty 2

## 2021-06-24 MED ORDER — DIPHENHYDRAMINE 25 MG CAPSULE
50.0000 mg | ORAL_CAPSULE | ORAL | Status: AC
Start: 2021-06-24 — End: 2021-06-24
  Administered 2021-06-24: 50 mg via ORAL

## 2021-06-24 MED ORDER — NITROGLYCERIN 0.4 MG SUBLINGUAL TABLET
0.4000 mg | SUBLINGUAL_TABLET | SUBLINGUAL | Status: AC
Start: 2021-06-24 — End: 2021-06-24
  Administered 2021-06-24: 0.4 mg via SUBLINGUAL

## 2021-06-24 MED ORDER — VALSARTAN 40 MG TABLET
ORAL_TABLET | ORAL | Status: AC
Start: 2021-06-24 — End: 2021-06-24
  Filled 2021-06-24: qty 2

## 2021-06-24 MED ORDER — NITROGLYCERIN 0.4 MG SUBLINGUAL TABLET
SUBLINGUAL_TABLET | SUBLINGUAL | Status: AC
Start: 2021-06-24 — End: 2021-06-24
  Filled 2021-06-24: qty 1

## 2021-06-24 MED ORDER — VALSARTAN 40 MG TABLET
80.0000 mg | ORAL_TABLET | Freq: Every day | ORAL | Status: DC
Start: 2021-06-24 — End: 2021-06-24
  Administered 2021-06-24: 80 mg via ORAL

## 2021-06-24 MED ORDER — HYDROMORPHONE 2 MG/ML INJECTION WRAPPER
INJECTION | INTRAMUSCULAR | Status: AC
Start: 2021-06-24 — End: 2021-06-24
  Filled 2021-06-24: qty 1

## 2021-06-24 MED ORDER — HYDROMORPHONE 2 MG/ML INJECTION WRAPPER
2.0000 mg | INJECTION | INTRAMUSCULAR | Status: AC
Start: 2021-06-24 — End: 2021-06-24
  Administered 2021-06-24: 2 mg via INTRAVENOUS

## 2021-06-24 NOTE — Consults (Signed)
[name] 46 y.o. female ED06/ED06   Date of Service: 06/24/2021    Date of Admission:  06/24/2021   PCP: Cam Hai, FNP Code Status:Prior       Reason for Consultation:  End-stage renal disease on hemodialysis  HPI:   46 year old female with past medical history of heart disease, end-stage renal disease on hemodialysis MWF, hypertension uncontrolled, compliance issues, presenting to the hospital with complaint of chest pain.  Patient came in with complaint of chest pain in the middle of the chest is worse when she is coughing, reports shortness of breath generalized weakness.  Patient did not take her blood pressure medications while waiting in the emergency room and her blood pressure is elevated.  Patient is due for dialysis today nephrology service was consulted.  Discussed with the patient the overall clinical condition she mentioned she missed dialysis on Friday she mentioned she had to do that due to living situation where she had to move to a new place otherwise she is going to low her housing, patient currently does not have transportation arranged outpatient to help taking her to dialysis back and forth.    I spoke with the case management in the emergency room I spoke with the charge nurse and the patient to help with arrangements for transportation as patient receives dialysis 3 times a week on regular basis and transportation is important patient lives in [place] then she gets dialysis in [place] [facility].      ROS:   Systematic review of 12 organ systems was negative except what mentioned in in the HPI.    ED medications:   Medications Administered in the ED   nitroGLYCERIN (NITROSTAT) sublingual tablet (0.4 mg Sublingual Given 06/24/21 0227)   HYDROmorphone (DILAUDID) 2 mg/mL injection (2 mg Intravenous Given 06/24/21 0351)   diphenhydrAMINE (BENADRYL) capsule (50 mg Oral Given 06/24/21 1126)       PMHx:    Past  Medical History:   Diagnosis Date   . Asthma    . Congestive heart failure (CMS HCC)    . COPD (chronic obstructive pulmonary disease) (CMS HCC)    . Esophageal reflux    . ESRD (end stage renal disease) (CMS [place])    . HTN (hypertension)    . Sleep apnea         PSHx:   Past Surgical History:   Procedure Laterality Date   . HX BACK SURGERY     . HX CHOLECYSTECTOMY     . HX HYSTERECTOMY     . HX TONSILLECTOMY            Allergies:    Allergies   Allergen Reactions   . Lisinopril Swelling    Social History  Social History     Tobacco Use   . Smoking status: Every Day     Packs/day: 0.50     Types: Cigarettes   . Smokeless tobacco: Never   Vaping Use   . Vaping Use: Never used   Substance Use Topics   . Alcohol use: Not Currently   . Drug use: Yes     Types: Marijuana       Family History  Family Medical History:    None            Home Meds:      Prior to Admission medications    Medication Sig Start Date End Date  Taking? Authorizing Provider   albuterol sulfate (PROVENTIL OR VENTOLIN OR PROAIR) 90 mcg/actuation Inhalation oral inhaler Take 1-2 Puffs by inhalation Every 6 hours as needed    Provider, Historical   aspirin (ECOTRIN) 81 mg Oral Tablet, Delayed Release (E.C.) Take 1 Tablet (81 mg total) by mouth Once a day    Provider, Historical   carvediloL (COREG) 25 mg Oral Tablet Take 1 Tablet (25 mg total) by mouth Twice daily for 30 days 12/13/20 01/12/21  Buena Irish, MD   cloNIDine HCL (CATAPRES) 0.1 mg Oral Tablet Take 1 Tablet (0.1 mg total) by mouth in the morning and 1 Tablet (0.1 mg total) at noon and 1 Tablet (0.1 mg total) before bedtime.    Provider, Historical   clopidogreL (PLAVIX) 75 mg Oral Tablet Take 1 Tablet (75 mg total) by mouth Once a day for 30 days 12/14/20 01/13/21  Murugu-Knopf, Lars Pinks, MD   dilTIAZem HCL (CARDIZEM) 120 mg Oral Tablet Take 1 Tablet (120 mg total) by mouth in the morning and 1 Tablet (120 mg total) at noon and 1 Tablet (120 mg total) before bedtime.    Provider,  Historical   doxazosin (CARDURA) 4 mg Oral Tablet Take 1 Tablet (4 mg total) by mouth Every evening    Provider, Historical   furosemide (LASIX) 80 mg Oral Tablet Take 1 Tablet (80 mg total) by mouth in the morning and 1 Tablet (80 mg total) in the evening.    Provider, Historical   hydrALAZINE (APRESOLINE) 100 mg Oral Tablet Take 1 Tablet (100 mg total) by mouth Three times a day for 30 days 12/13/20 01/12/21  Buena Irish, MD   hydrOXYzine pamoate (VISTARIL) 25 mg Oral Capsule Take 1 Capsule (25 mg total) by mouth Every night as needed for Itching    Provider, Historical   isosorbide mononitrate (IMDUR) 60 mg Oral Tablet Sustained Release 24 hr Take 1 Tablet (60 mg total) by mouth Every morning    Provider, Historical   labetaloL (NORMODYNE) 200 mg Oral Tablet Take 1 Tablet (200 mg total) by mouth in the morning and 1 Tablet (200 mg total) at noon and 1 Tablet (200 mg total) before bedtime. 1.5 tabs.    Provider, Historical   lactulose (ENULOSE) 10 gram/15 mL Oral Solution Take 30 mL by mouth Once a day for 3 days 06/04/21 06/07/21  Regina Eck, DO   loratadine (CLARITIN) 10 mg Oral Tablet Take 1 Tablet (10 mg total) by mouth Once a day    Provider, Historical   losartan (COZAAR) 50 mg Oral Tablet Take 1 Tablet (50 mg total) by mouth Once a day    Provider, Historical   montelukast (SINGULAIR) 10 mg Oral Tablet Take 1 Tablet (10 mg total) by mouth Every evening    Provider, Historical   nitroGLYCERIN (NITROSTAT) 0.4 mg Sublingual Tablet, Sublingual Place 1 Tablet (0.4 mg total) under the tongue Every 5 minutes for 3 doses for 3 doses over 15 minutes 06/23/21 06/24/21  Elder, Gildardo Cranker, MD   oxyCODONE-acetaminophen (PERCOCET) 5-325 mg Oral Tablet Take 1 Tablet by mouth Every 4 hours as needed 12/13/20   Murugu-Knopf, Roselyne, MD   pantoprazole (PROTONIX) 40 mg Oral Tablet, Delayed Release (E.C.) Take 1 Tablet (40 mg total) by mouth Once a day    Provider, Historical   polyethylene glycol (MIRALAX) 17 gram  Oral Powder in Packet Take 1 Packet (17 g total) by mouth Once a day 12/14/20   Murugu-Knopf, Roselyne, MD   sennosides-docusate sodium (SENOKOT-S) 8.6-50 mg  Oral Tablet Take 1 Tablet by mouth Twice daily 12/13/20   Murugu-Knopf, Roselyne, MD   spironolactone (ALDACTONE) 25 mg Oral Tablet Take 1 Tablet (25 mg total) by mouth Once a day    Provider, Historical          Current medications   No current facility-administered medications for this encounter.          Physical:  Filed Vitals:    06/24/21 1125 06/24/21 1130 06/24/21 1135 06/24/21 1145   BP: (!) 178/105 (!) 181/100     Pulse: 68 64 71 76   Resp: 13 15 18 20    Temp:    37.1 C (98.7 F)   SpO2:   100%       No intake or output data in the 24 hours ending 06/24/21 1511     Patient is alert awake and oriented not in acute distress.  Normal mood and affect.  HEENT normocephalic atraumatic.  Eye exam normal inspection.    Mucous membranes moist, no jaundice.  Neck exam no JVD normal inspection.  Cardiovascular system: Regular rate and rhythm no murmurs rubs or gallops. No chest wall tenderness  Lungs: Clear to auscultation bilaterally no wheezing no rhonchi.  Abdomen soft nontender nondistended.  Extremities no clubbing cyanosis or edema   Neuro exam: EOMI, normal speech  Right upper chest PermCath in place    Labs:  CBC:     11.3* (05/01 0119) \   9.2* (05/01 0119) /   268 (05/01 0119)      / 27.4* (05/01 0119) \          BMP:   134* (05/01 0119) 94* (05/01 0119) 65* (05/01 0119)    /     85 (05/01 0119)   4.2 (05/01 0119) 23 (05/01 0119) 15.14* (05/01 0119) \                 Diagnostic studies:  Imaging:   XR AP MOBILE CHEST  Narrative: [name]    RADIOLOGIST: Colletta Maryland, MD    XR AP MOBILE CHEST performed on 06/24/2021 2:32 AM    CLINICAL HISTORY: chest pain, dyspnea.  chest pain, dyspnea    TECHNIQUE: Frontal view of the chest.    COMPARISON:  06/23/2021    FINDINGS:  Right internal jugular catheter overlies the SVC   The heart size is normal.   There  are increased markings in the mid to lower lungs with indistinctness of the basilar pulmonary vasculature. This could be due to some mild interstitial edema. Because it is asymmetrically prominent on the right side, an infectious or inflammatory process is not excluded.   Impression: BASILAR INTERSTITIAL PROMINENCE. FAVOR INTERSTITIAL EDEMA.    Radiologist location ID: [id number]  ECG 12 LEAD  Normal sinus rhythm  Minimal voltage criteria for LVH, may be normal variant ( Cornell product )  Prolonged QT  Abnormal ECG  When compared with ECG of 23-Jun-2021 00:07,  T wave inversion no longer evident in Lateral leads  ECG 12 LEAD  Sinus rhythm with 1st degree AV block  Possible Left atrial enlargement  Minimal voltage criteria for LVH, may be normal variant ( Cornell product )  T wave abnormality, consider lateral ischemia  Abnormal ECG  When compared with ECG of 09-Jun-2021 09:52,  QT has shortened  Confirmed by Rana, Shahid ([id number]) on 06/24/2021 12:01:04 AM      Assessments:  Active Hospital Problems   (*Primary Problem)    Diagnosis   .  ESRD (end stage renal disease) (CMS [place])   . Uncontrolled hypertension   . SVC syndrome       Plan:   ESRD on HD  -patient missed dialysis on Friday we will arrange for dialysis today either inpatient or outpatient.  -I have requested from case management to have a safe discharge plan, I have discussed with him the importance to work with the patient on providing transportation from and 2 dialysis as patient will be requiring dialysis 3 times a week.    -I have discussed the plan of care with the patient.  All questions were answered and addressed  -I also discussed the plan of care with the outpatient dialysis clinic I spoke with the dialysis nurse in charge their [facility] and we discussed that we are going to dialyze the patient today and case worker there to help also with the transportation arrangements.    Elevated troponin  -noted management per ER team/cardiology and medical  team    Hypertension  -resume home BP meds as soon as possible  -arrange for dialysis to help with the fluid status  -challenge patient weight  -patient was educated      Critical time spent on arrangements for outpatient dialysis and discussing the case with case management and ER staff more than 75 minutes                Beather Arbour, MD, FASN, 06/24/2021, 15:11

## 2021-06-24 NOTE — ED Nurses Note (Signed)
Spoke with dhhr this am regarding pt concern for transportion to and from dialysis , pt reports she has moved and they need to update info . [facility] spoke with pt to obtain updated info .

## 2021-06-24 NOTE — ED Triage Notes (Addendum)
Brought to ED via [facility] with c/o chest pain since yesterday. Evaluated yesterday at [facility] for same c/o. Pt reports missing last dialysis treatment (Friday). Given 0.4mg  NTG and 324mg  ASA

## 2021-06-24 NOTE — Discharge Instructions (Signed)
Discharge to dialysis.      Return to the ED if worse and as needed.

## 2021-06-24 NOTE — ED Provider Notes (Signed)
[facility]  Emergency Department  Attending Provider Note      CHIEF COMPLAINT  Chief Complaint   Patient presents with   . Chest Pain      HISTORY OF PRESENT ILLNESS  [name], date of birth 02/14/1976, is a 46 y.o. female who presented to the Emergency Department Via EMS due to chest pain.  The patient has End-stage renal disease on hemodialysis.   She frequently misses dialysis sessions and has had many frequent ED visits.    In fact, she missed her last dialysis  Session.  She was seen last night at the [facility].  She signed out against medical advice.  She states her pain persisted throughout the day today.    PAST MEDICAL/SURGICAL/FAMILY/SOCIAL HISTORY  Past Medical History:   Diagnosis Date   . Asthma    . Congestive heart failure (CMS HCC)    . COPD (chronic obstructive pulmonary disease) (CMS HCC)    . Esophageal reflux    . ESRD (end stage renal disease) (CMS [place])    . HTN (hypertension)    . Sleep apnea        Past Surgical History:   Procedure Laterality Date   . HX BACK SURGERY     . HX CHOLECYSTECTOMY     . HX HYSTERECTOMY     . HX TONSILLECTOMY         Family Medical History:    None       Social History     Socioeconomic History   . Marital status: Divorced   Tobacco Use   . Smoking status: Every Day     Packs/day: 0.50     Types: Cigarettes   . Smokeless tobacco: Never   Vaping Use   . Vaping Use: Never used   Substance and Sexual Activity   . Alcohol use: Not Currently   . Drug use: Yes     Types: Marijuana      ALLERGIES  Allergies   Allergen Reactions   . Lisinopril Swelling       PHYSICAL EXAM  VITAL SIGNS:  Filed Vitals:    06/24/21 0035   BP: (!) 187/117   Pulse: 91   Resp: 18   Temp: 37.2 C (98.9 F)   SpO2: 100%     GENERAL: PATIENT IS ALERT AND ORIENTED TO PERSON, PLACE, AND TIME.  HEAD: NORMOCEPHALIC AND ATRAUMATIC.  EYES: PUPILS EQUALLY ROUND AND REACT TO LIGHT. EXTRAOCULAR MOVEMENTS INTACT.  EARS: GROSS HEARING INTACT. EXTERNAL EARS WITHIN NORMAL  LIMITS.  NOSE: NO SEPTAL DEVIATION. NASAL PASSAGES CLEAR.  THROAT: MOIST ORAL MUCOSA. NO ERYTHEMA OR EXUDATE OF THE PHARYNX.  NECK: SUPPLE. TRACHEA MIDLINE.  CARDIOVASCULAR: REGULAR RATE, AND RHYTHM. NO MURMUR.  LUNGS: CLEAR TO AUSCULTATION BILATERAL.  ABDOMEN: SOFT, NON-TENDER, NON-DISTENDED, AND BOWEL SOUNDS ARE PRESENT.  GENITOURINARY: DEFERRED.  RECTAL: DEFERRED.  EXTREMITIES: NO CYANOSIS, CLUBBING, OR EDEMA.  SKIN: WARM AND DRY.  NEUROLOGIC: CRANIAL NERVES II THROUGH XII ARE GROSSLY INTACT. MOVES ALL 4 EXTREMITIES.  PSYCHIATRIC: JUDGMENT AND INSIGHT ARE SEEMINGLY INTACT. MOOD AND AFFECT ARE APPROPRIATE FOR THE SITUATION.    DIAGNOSTICS  Labs:  Labs listed below were reviewed and interpreted by me.  Results for orders placed or performed during the hospital encounter of 06/24/21   COMPREHENSIVE METABOLIC PANEL, NON-FASTING   Result Value Ref Range    SODIUM 134 (L) 136 - 145 mmol/L    POTASSIUM 4.2 3.5 - 5.1 mmol/L    CHLORIDE 94 (L)  98 - 107 mmol/L    CO2 TOTAL 23 21 - 31 mmol/L    ANION GAP 17 10 - 20 mmol/L    BUN 65 (H) 7 - 25 mg/dL    CREATININE 15.14 (H) 0.60 - 1.30 mg/dL    BUN/CREA RATIO 4 (L) 6 - 22    ESTIMATED GFR 3 (L) >59 mL/min/1.34m^2    ALBUMIN 4.0 3.5 - 5.7 g/dL    CALCIUM 10.2 8.6 - 10.3 mg/dL    GLUCOSE 85 74 - 109 mg/dL    ALKALINE PHOSPHATASE 71 34 - 104 U/L    ALT (SGPT) <7 (L) 7 - 52 U/L    AST (SGOT) 13 13 - 39 U/L    BILIRUBIN TOTAL 0.8 0.3 - 1.2 mg/dL    PROTEIN TOTAL 7.4 6.4 - 8.9 g/dL    ALBUMIN/GLOBULIN RATIO 1.2 0.8 - 1.4    OSMOLALITY, CALCULATED 286 270 - 290 mOsm/kg    CALCIUM, CORRECTED 10.2 8.9 - 10.8 mg/dL    GLOBULIN 3.4 2.9 - 5.4   TROPONIN-I NOW   Result Value Ref Range    TROPONIN I 91 (H) <15 ng/L   CBC WITH DIFF   Result Value Ref Range    WBCS UNCORRECTED 11.3 x10^3/uL    WBC 11.3 (H) 4.0 - 10.5 x10^3/uL    RBC 3.02 (L) 4.20 - 5.40 x10^6/uL    HGB 9.2 (L) 12.5 - 16.0 g/dL    HCT 27.4 (L) 37.0 - 47.0 %    MCV 90.8 78.0 - 99.0 fL    MCH 30.5 27.0 - 32.0 pg    MCHC 33.6  32.0 - 36.0 g/dL    RDW 19.8 (H) 11.6 - 14.8 %    PLATELETS 268 140 - 440 x10^3/uL    MPV 6.7 (L) 7.4 - 10.4 fL    NEUTROPHIL % 80 (H) 40 - 76 %    LYMPHOCYTE % 8 (L) 25 - 45 %    MONOCYTE % 8 0 - 12 %    EOSINOPHIL % 3 0 - 7 %    BASOPHIL % 1 0 - 3 %    NEUTROPHIL # 9.00 (H) 1.80 - 8.40 x10^3/uL    LYMPHOCYTE # 0.90 (L) 1.10 - 5.00 x10^3/uL    MONOCYTE # 0.90 0.00 - 1.30 x10^3/uL    EOSINOPHIL # 0.40 0.00 - 0.80 x10^3/uL    BASOPHIL # 0.20 0.00 - 0.30 x10^3/uL   BLUE TOP TUBE   Result Value Ref Range    RAINBOW/EXTRA TUBE AUTO RESULT Yes    GRAY TOP TUBE   Result Value Ref Range    RAINBOW/EXTRA TUBE AUTO RESULT Yes    TROPONIN-I IN ONE HOUR   Result Value Ref Range    TROPONIN I 89 (H) <15 ng/L   TROPONIN-I IN THREE HOOURS   Result Value Ref Range    TROPONIN I 98 (H) <15 ng/L   ECG 12 LEAD   Result Value Ref Range    Ventricular rate 98 BPM    Atrial Rate 98 BPM    PR Interval 194 ms    QRS Duration 100 ms    QT Interval 404 ms    QTC Calculation 515 ms    Calculated P Axis 69 degrees    Calculated R Axis 17 degrees    Calculated T Axis 73 degrees     Radiology:  Results for orders placed or performed during the hospital encounter of 06/24/21   XR AP MOBILE CHEST  Status: None    Narrative    [name]    RADIOLOGIST: Colletta Maryland, MD    XR AP MOBILE CHEST performed on 06/24/2021 2:32 AM    CLINICAL HISTORY: chest pain, dyspnea.  chest pain, dyspnea    TECHNIQUE: Frontal view of the chest.    COMPARISON:  06/23/2021    FINDINGS:  Right internal jugular catheter overlies the SVC   The heart size is normal.   There are increased markings in the mid to lower lungs with indistinctness of the basilar pulmonary vasculature. This could be due to some mild interstitial edema. Because it is asymmetrically prominent on the right side, an infectious or inflammatory process is not excluded.           Impression    BASILAR INTERSTITIAL PROMINENCE. FAVOR INTERSTITIAL EDEMA.      Radiologist location ID: [id number]          ED COURSE/MEDICAL DECISION MAKING  Medications Administered in the ED   nitroGLYCERIN (NITROSTAT) sublingual tablet (0.4 mg Sublingual Given 06/24/21 0227)   HYDROmorphone (DILAUDID) 2 mg/mL injection (2 mg Intravenous Given 06/24/21 0351)      ED Course as of 06/24/21 0646   Mon Jun 24, 2021   0644 TROPONIN-I(!): 98    Chronically elevated   0644 POTASSIUM: 4.2   normal   0644 HGB(!): 9.2   unchanged   0645 ECG 12 LEAD   Sinus rhythm, rate 98, IVCD, no acute ST changes      Medical Decision Making   The patient presented to the ED with chest pain.  She had missed her previous dialysis session 3 days ago.  She does appear to be mildly fluid overloaded.  Cardiac evaluation does reveal a mildly elevated troponin.  The patient does have a chronically elevated troponin.  Otherwise evaluation is normal.  The patient will be discharged to dialysis this morning.    Amount and/or Complexity of Data Reviewed  Labs: ordered. Decision-making details documented in ED Course.  ECG/medicine tests: ordered and independent interpretation performed. Decision-making details documented in ED Course.          CLINICAL IMPRESSION  Clinical Impression   Hypervolemia, unspecified hypervolemia type (Primary)   End-stage renal disease on hemodialysis (CMS [facility])     DISPOSITION  Discharged       DISCHARGE MEDICATIONS  Current Discharge Medication List          Quita Skye Sabra Heck D.O.   06/24/2021, 02:43   [facility]  Department of Emergency Medicine  [facility]    -----

## 2021-06-27 ENCOUNTER — Emergency Department
Admission: EM | Admit: 2021-06-27 | Discharge: 2021-06-27 | Discharge status: Against Medical Advice | Payer: Commercial Managed Care - PPO | Attending: FAMILY PRACTICE | Admitting: FAMILY PRACTICE

## 2021-06-27 ENCOUNTER — Inpatient Hospital Stay (HOSPITAL_BASED_OUTPATIENT_CLINIC_OR_DEPARTMENT_OTHER): Payer: Commercial Managed Care - PPO | Admitting: Emergency Medicine

## 2021-06-27 ENCOUNTER — Encounter (HOSPITAL_BASED_OUTPATIENT_CLINIC_OR_DEPARTMENT_OTHER): Payer: Self-pay

## 2021-06-27 ENCOUNTER — Other Ambulatory Visit: Payer: Self-pay

## 2021-06-27 ENCOUNTER — Emergency Department (EMERGENCY_DEPARTMENT_HOSPITAL): Payer: Commercial Managed Care - PPO

## 2021-06-27 DIAGNOSIS — R079 Chest pain, unspecified: Secondary | ICD-10-CM

## 2021-06-27 DIAGNOSIS — Z91199 Patient's noncompliance with other medical treatment and regimen due to unspecified reason: Secondary | ICD-10-CM | POA: Insufficient documentation

## 2021-06-27 DIAGNOSIS — R9431 Abnormal electrocardiogram [ECG] [EKG]: Secondary | ICD-10-CM | POA: Insufficient documentation

## 2021-06-27 DIAGNOSIS — R1012 Left upper quadrant pain: Secondary | ICD-10-CM | POA: Insufficient documentation

## 2021-06-27 DIAGNOSIS — I120 Hypertensive chronic kidney disease with stage 5 chronic kidney disease or end stage renal disease: Secondary | ICD-10-CM

## 2021-06-27 DIAGNOSIS — I509 Heart failure, unspecified: Secondary | ICD-10-CM | POA: Insufficient documentation

## 2021-06-27 DIAGNOSIS — G8929 Other chronic pain: Secondary | ICD-10-CM | POA: Diagnosis present

## 2021-06-27 DIAGNOSIS — N186 End stage renal disease: Secondary | ICD-10-CM | POA: Insufficient documentation

## 2021-06-27 DIAGNOSIS — F419 Anxiety disorder, unspecified: Secondary | ICD-10-CM | POA: Insufficient documentation

## 2021-06-27 DIAGNOSIS — F17210 Nicotine dependence, cigarettes, uncomplicated: Secondary | ICD-10-CM | POA: Insufficient documentation

## 2021-06-27 DIAGNOSIS — Z992 Dependence on renal dialysis: Secondary | ICD-10-CM | POA: Insufficient documentation

## 2021-06-27 DIAGNOSIS — F32A Depression, unspecified: Secondary | ICD-10-CM | POA: Insufficient documentation

## 2021-06-27 DIAGNOSIS — Z5329 Procedure and treatment not carried out because of patient's decision for other reasons: Secondary | ICD-10-CM | POA: Insufficient documentation

## 2021-06-27 DIAGNOSIS — R778 Other specified abnormalities of plasma proteins: Secondary | ICD-10-CM

## 2021-06-27 DIAGNOSIS — R1011 Right upper quadrant pain: Secondary | ICD-10-CM | POA: Insufficient documentation

## 2021-06-27 DIAGNOSIS — I132 Hypertensive heart and chronic kidney disease with heart failure and with stage 5 chronic kidney disease, or end stage renal disease: Secondary | ICD-10-CM | POA: Insufficient documentation

## 2021-06-27 LAB — CBC WITH DIFF
HCT: 30.3 % — ABNORMAL LOW (ref 37.0–47.0)
HGB: 9.5 g/dL — ABNORMAL LOW (ref 12.5–16.0)
MCH: 29.6 pg (ref 27.0–32.0)
MCHC: 31.3 g/dL — ABNORMAL LOW (ref 32.0–36.0)
MCV: 94.7 fL (ref 78.0–99.0)
MPV: 7.8 fL (ref 7.4–10.4)
PLATELETS: 293 10*3/uL (ref 140–440)
RBC: 3.2 10*6/uL — ABNORMAL LOW (ref 4.20–5.40)
RDW: 19.3 % — ABNORMAL HIGH (ref 11.6–14.8)
WBC: 8.1 10*3/uL (ref 4.0–10.5)

## 2021-06-27 LAB — COMPREHENSIVE METABOLIC PANEL, NON-FASTING
ALBUMIN/GLOBULIN RATIO: 0.8 (ref 0.8–1.4)
ALBUMIN: 3.5 g/dL (ref 3.4–5.0)
ALKALINE PHOSPHATASE: 77 U/L (ref 46–116)
ALT (SGPT): 12 U/L (ref <=78)
ANION GAP: 13 mmol/L (ref 10–20)
AST (SGOT): 18 U/L (ref 15–37)
BILIRUBIN TOTAL: 0.6 mg/dL (ref 0.2–1.0)
BUN/CREA RATIO: 3
BUN: 26 mg/dL — ABNORMAL HIGH (ref 7–18)
CALCIUM, CORRECTED: 10.5 mg/dL
CALCIUM: 10 mg/dL (ref 8.5–10.1)
CHLORIDE: 94 mmol/L — ABNORMAL LOW (ref 98–107)
CO2 TOTAL: 27 mmol/L (ref 21–32)
CREATININE: 8.07 mg/dL — ABNORMAL HIGH (ref 0.55–1.02)
ESTIMATED GFR: 6 mL/min/{1.73_m2} — ABNORMAL LOW (ref >59)
GLOBULIN: 4.6
GLUCOSE: 95 mg/dL (ref 74–106)
OSMOLALITY, CALCULATED: 273 mOsm/kg (ref 270–290)
POTASSIUM: 4 mmol/L (ref 3.5–5.1)
PROTEIN TOTAL: 8.1 g/dL (ref 6.4–8.2)
SODIUM: 134 mmol/L — ABNORMAL LOW (ref 136–145)

## 2021-06-27 LAB — MANUAL DIFFERENTIAL
BASOPHIL %: 2 % (ref 0–3)
BASOPHIL ABSOLUTE: 0.16 10*3/uL (ref 0.00–0.30)
BASOPHILS MANUAL: 2
EOSINOPHIL %: 1 % (ref 0–7)
EOSINOPHIL ABSOLUTE: 0.08 10*3/uL (ref 0.00–0.80)
EOSINOPHILS MANUAL: 1
LYMPHOCYTE %: 16 % — ABNORMAL LOW (ref 25–45)
LYMPHOCYTE ABSOLUTE: 1.3 10*3/uL (ref 1.10–5.00)
LYMPHOCYTES MANUAL: 16
MONOCYTE %: 6 % (ref 0–12)
MONOCYTE ABSOLUTE: 0.49 10*3/uL (ref 0.00–1.30)
MONOCYTES MANUAL: 6
NEUTROPHIL %: 75 % (ref 40–76)
NEUTROPHIL ABSOLUTE: 6.08 10*3/uL (ref 1.80–8.40)
NEUTROPHILS MANUAL: 75
PLATELET MORPHOLOGY COMMENT: ADEQUATE
TOTAL CELLS COUNTED [#] IN BLOOD: 100
WBC: 8.1 10*3/uL

## 2021-06-27 LAB — TROPONIN-I
TROPONIN I: 80 ng/L — ABNORMAL HIGH (ref <15)
TROPONIN I: 79 ng/L — ABNORMAL HIGH (ref <15)
TROPONIN I: 74 ng/L — ABNORMAL HIGH (ref <15)

## 2021-06-27 LAB — PT/INR
INR: 1.19 — ABNORMAL HIGH (ref 0.88–1.10)
PROTHROMBIN TIME: 12.6 seconds — ABNORMAL HIGH (ref 9.2–12.1)

## 2021-06-27 LAB — ECG 12 LEAD
Atrial Rate: 75 {beats}/min
Calculated P Axis: 61 degrees
Calculated R Axis: -3 degrees
Calculated T Axis: 89 degrees
PR Interval: 204 ms
QRS Duration: 104 ms
QT Interval: 454 ms
QTC Calculation: 506 ms
Ventricular rate: 75 {beats}/min

## 2021-06-27 LAB — LIPASE: LIPASE: 113 U/L (ref 73–393)

## 2021-06-27 MED ORDER — HYDROCODONE 5 MG-ACETAMINOPHEN 325 MG TABLET
ORAL_TABLET | ORAL | Status: AC
Start: 2021-06-27 — End: 2021-06-27
  Filled 2021-06-27: qty 2

## 2021-06-27 MED ORDER — FAMOTIDINE 20 MG TABLET
40.0000 mg | ORAL_TABLET | ORAL | Status: AC
Start: 2021-06-27 — End: 2021-06-27
  Administered 2021-06-27: 40 mg via ORAL

## 2021-06-27 MED ORDER — NITROGLYCERIN 2 % TRANSDERMAL OINTMENT - PACKET
TOPICAL_OINTMENT | TRANSDERMAL | Status: AC
Start: 2021-06-27 — End: 2021-06-27
  Filled 2021-06-27: qty 1

## 2021-06-27 MED ORDER — ONDANSETRON 4 MG DISINTEGRATING TABLET
ORAL_TABLET | ORAL | Status: AC
Start: 2021-06-27 — End: 2021-06-27
  Filled 2021-06-27: qty 1

## 2021-06-27 MED ORDER — FAMOTIDINE 20 MG TABLET
ORAL_TABLET | ORAL | Status: AC
Start: 2021-06-27 — End: 2021-06-27
  Filled 2021-06-27: qty 2

## 2021-06-27 MED ORDER — HYDROCODONE 5 MG-ACETAMINOPHEN 325 MG TABLET
2.0000 | ORAL_TABLET | ORAL | Status: AC
Start: 2021-06-27 — End: 2021-06-27
  Administered 2021-06-27: 2 via ORAL

## 2021-06-27 MED ORDER — HYDRALAZINE 50 MG TABLET
100.0000 mg | ORAL_TABLET | ORAL | Status: AC
Start: 2021-06-27 — End: 2021-06-27
  Administered 2021-06-27: 100 mg via ORAL

## 2021-06-27 MED ORDER — HYDRALAZINE 50 MG TABLET
ORAL_TABLET | ORAL | Status: AC
Start: 2021-06-27 — End: 2021-06-27
  Filled 2021-06-27: qty 2

## 2021-06-27 MED ORDER — NITROGLYCERIN 2 % TRANSDERMAL OINTMENT - PACKET
0.5000 [in_us] | TOPICAL_OINTMENT | TRANSDERMAL | Status: AC
Start: 2021-06-27 — End: 2021-06-27
  Administered 2021-06-27: 0.5 [in_us] via TOPICAL

## 2021-06-27 MED ORDER — ONDANSETRON 4 MG DISINTEGRATING TABLET
4.0000 mg | ORAL_TABLET | ORAL | Status: AC
Start: 2021-06-27 — End: 2021-06-27
  Administered 2021-06-27: 4 mg via ORAL

## 2021-06-27 NOTE — ED Nurses Note (Signed)
O2 sats in the 80's. Woke patient from sleep. Instructed to take some deep breaths. O2 sats increased to 95%. Asked patient about pain. Patient reports sternal chest pain. States its an eight on pain scale at present time. While in room patient went back to sleep and O2 sats decreased to 85%. Awaken patient instructed to take deep breaths. Patient remains drowsy. Placed on O2 at 2lpm and O2 sat increased to 95%

## 2021-06-27 NOTE — ED Nurses Note (Signed)
Pt c/o pain under left breast into LUQ abdomen down into left flank with N/V, states been going on x 1 week but worse tonight.

## 2021-06-27 NOTE — ED Triage Notes (Signed)
Pt c/o LUQ abdominal pain with N/V past couple weeks, worse tonight. Last had Dialysis yesterday

## 2021-06-27 NOTE — Discharge Instructions (Signed)
With your chest pain, abdominal pain, uncontrolled blood pressure you will need to continue current home medications, call [name] deal for follow-up appointment bright and early in the morning.  I would recommend with your past that you be evaluated by Psychology not depression or anxiety, with your past history you likely have PTSD that needs to be diagnosed and properly treated.  There are provider's at do work with PTSD, I would start there.  Endocrinology may be able to help you with your blood pressure, which will greatly reduce your risk of heart attack, stroke, vascular disease that may result in blindness or amputations.

## 2021-06-27 NOTE — ED Attending Note (Addendum)
[place] emergency department         HISTORY OF PRESENT ILLNESS     Date:  06/27/2021  Patient's Name:  [name]  Date of Birth:  March 07, 1975    Patient is a 46 year old female presenting to the emergency room complaining of sudden onset of left-sided sharp pain and left upper abdomen pain within the last 3-4 hours.  Patient denies cough.  Patient last dialyzed yesterday.  Being evaluated patient is in no obvious distress.  Pain is worse with movement of her left upper arm and upper chest.  Patient states she has had this pain in multiple occasions over the last few weeks.          Review of Systems     Review of Systems   Constitutional: Negative.    HENT: Negative.    Eyes: Negative.    Respiratory: Negative.    Cardiovascular: Positive for chest pain.   Gastrointestinal: Positive for nausea and vomiting.   Genitourinary: Negative.    Musculoskeletal: Negative.    Psychiatric/Behavioral: Negative.        Previous History     Past Medical History:  Past Medical History:   Diagnosis Date   . Asthma    . Congestive heart failure (CMS HCC)    . COPD (chronic obstructive pulmonary disease) (CMS HCC)    . Esophageal reflux    . ESRD (end stage renal disease) (CMS [place])    . HTN (hypertension)    . Sleep apnea        Past Surgical History:  Past Surgical History:   Procedure Laterality Date   . Hx back surgery     . Hx cholecystectomy     . Hx hysterectomy     . Hx tonsillectomy         Social History:  Social History     Tobacco Use   . Smoking status: Every Day     Packs/day: 0.50     Types: Cigarettes   . Smokeless tobacco: Never   Vaping Use   . Vaping Use: Never used   Substance Use Topics   . Alcohol use: Not Currently   . Drug use: Yes     Types: Marijuana     Social History     Substance and Sexual Activity   Drug Use Yes   . Types: Marijuana       Family History:  No family history on file.    Medication History:  Current Outpatient Medications   Medication Sig   .  albuterol sulfate (PROVENTIL OR VENTOLIN OR PROAIR) 90 mcg/actuation Inhalation oral inhaler Take 1-2 Puffs by inhalation Every 6 hours as needed   . aspirin (ECOTRIN) 81 mg Oral Tablet, Delayed Release (E.C.) Take 1 Tablet (81 mg total) by mouth Once a day   . carvediloL (COREG) 25 mg Oral Tablet Take 1 Tablet (25 mg total) by mouth Twice daily for 30 days   . cloNIDine HCL (CATAPRES) 0.1 mg Oral Tablet Take 1 Tablet (0.1 mg total) by mouth in the morning and 1 Tablet (0.1 mg total) at noon and 1 Tablet (0.1 mg total) before bedtime.   . clopidogreL (PLAVIX) 75 mg Oral Tablet Take 1 Tablet (75 mg total) by mouth Once a day for 30 days   . dilTIAZem HCL (CARDIZEM) 120 mg Oral Tablet Take 1 Tablet (120 mg total) by mouth in the morning and 1 Tablet (120 mg total) at noon  and 1 Tablet (120 mg total) before bedtime.   Marland Kitchen doxazosin (CARDURA) 4 mg Oral Tablet Take 1 Tablet (4 mg total) by mouth Every evening   . furosemide (LASIX) 80 mg Oral Tablet Take 1 Tablet (80 mg total) by mouth in the morning and 1 Tablet (80 mg total) in the evening.   . hydrALAZINE (APRESOLINE) 100 mg Oral Tablet Take 1 Tablet (100 mg total) by mouth Three times a day for 30 days   . hydrOXYzine pamoate (VISTARIL) 25 mg Oral Capsule Take 1 Capsule (25 mg total) by mouth Every night as needed for Itching   . isosorbide mononitrate (IMDUR) 60 mg Oral Tablet Sustained Release 24 hr Take 1 Tablet (60 mg total) by mouth Every morning   . labetaloL (NORMODYNE) 200 mg Oral Tablet Take 1 Tablet (200 mg total) by mouth in the morning and 1 Tablet (200 mg total) at noon and 1 Tablet (200 mg total) before bedtime. 1.5 tabs.   Marland Kitchen loratadine (CLARITIN) 10 mg Oral Tablet Take 1 Tablet (10 mg total) by mouth Once a day   . losartan (COZAAR) 50 mg Oral Tablet Take 1 Tablet (50 mg total) by mouth Once a day   . montelukast (SINGULAIR) 10 mg Oral Tablet Take 1 Tablet (10 mg total) by mouth Every evening   . nitroGLYCERIN (NITROSTAT) 0.4 mg Sublingual Tablet,  Sublingual Place 1 Tablet (0.4 mg total) under the tongue Every 5 minutes for 3 doses for 3 doses over 15 minutes   . oxyCODONE-acetaminophen (PERCOCET) 5-325 mg Oral Tablet Take 1 Tablet by mouth Every 4 hours as needed   . pantoprazole (PROTONIX) 40 mg Oral Tablet, Delayed Release (E.C.) Take 1 Tablet (40 mg total) by mouth Once a day   . polyethylene glycol (MIRALAX) 17 gram Oral Powder in Packet Take 1 Packet (17 g total) by mouth Once a day   . sennosides-docusate sodium (SENOKOT-S) 8.6-50 mg Oral Tablet Take 1 Tablet by mouth Twice daily   . spironolactone (ALDACTONE) 25 mg Oral Tablet Take 1 Tablet (25 mg total) by mouth Once a day       Allergies:  Allergies   Allergen Reactions   . Lisinopril Swelling       Physical Exam     Vitals:    BP (!) 159/105   Pulse 73   Temp 36 C (96.8 F)   Resp 16   Ht 1.702 m (5\' 7" )   Wt 81 kg (178 lb 9.2 oz)   SpO2 98%   BMI 27.97 kg/m           Physical Exam  Vitals and nursing note reviewed.   Constitutional:       General: She is not in acute distress.     Appearance: She is well-developed.   HENT:      Head: Normocephalic and atraumatic.   Eyes:      Conjunctiva/sclera: Conjunctivae normal.   Cardiovascular:      Rate and Rhythm: Normal rate and regular rhythm.      Heart sounds: No murmur heard.  Pulmonary:      Effort: Pulmonary effort is normal. No respiratory distress.      Breath sounds: Normal breath sounds.   Abdominal:      Palpations: Abdomen is soft.      Tenderness: There is no abdominal tenderness.   Musculoskeletal:         General: No swelling.      Cervical back: Neck supple.  Skin:     General: Skin is warm and dry.      Capillary Refill: Capillary refill takes less than 2 seconds.   Neurological:      Mental Status: She is alert.   Psychiatric:         Mood and Affect: Mood normal.         Diagnostic Studies/Treatment     Medications:  Medications Administered in the ED   nitroGLYCERIN (NITRO-BID) 2 % topical ointment (0.5 Inches Apply Topically  Given 06/27/21 6270)   hydrALAZINE (APRESOLINE) tablet (100 mg Oral Given 06/27/21 0637)   famotidine (PEPCID) tablet (40 mg Oral Given 06/27/21 3500)   HYDROcodone-acetaminophen (NORCO) 5-325 mg per tablet (2 Tablets Oral Given 06/27/21 9381)   ondansetron (ZOFRAN ODT) rapid dissolve tablet (4 mg Oral Given 06/27/21 0631)       New Prescriptions    No medications on file       Labs:    Results for orders placed or performed during the hospital encounter of 06/27/21 (from the past 12 hour(s))   TROPONIN-I (Q3H X 3)   Result Value Ref Range    TROPONIN I 80 (H) <15 ng/L   CBC WITH DIFF   Result Value Ref Range    WBCS UNCORRECTED      WBC 8.1 4.0 - 10.5 x10^3/uL    RBC 3.20 (L) 4.20 - 5.40 x10^6/uL    HGB 9.5 (L) 12.5 - 16.0 g/dL    HCT 30.3 (L) 37.0 - 47.0 %    MCV 94.7 78.0 - 99.0 fL    MCH 29.6 27.0 - 32.0 pg    MCHC 31.3 (L) 32.0 - 36.0 g/dL    RDW 19.3 (H) 11.6 - 14.8 %    PLATELETS 293 140 - 440 x10^3/uL    MPV 7.8 7.4 - 10.4 fL   MANUAL DIFFERENTIAL   Result Value Ref Range    WBC 8.1 x10^3/uL    NEUTROPHIL % 75 40 - 76 %    LYMPHOCYTE % 16 (L) 25 - 45 %    MONOCYTE % 6 0 - 12 %    EOSINOPHIL % 1 0 - 7 %    BASOPHIL % 2 0 - 3 %    METAMYELOCYTE %      MYELOCYTE %      PROMYELOCYTE %      BAND %      BLAST %      OTHER %      NEUTROPHIL ABSOLUTE 6.08 1.80 - 8.40 x10^3/uL    LYMPHOCYTE ABSOLUTE 1.30 1.10 - 5.00 x10^3/uL    MONOCYTE ABSOLUTE 0.49 0.00 - 1.30 x10^3/uL    EOSINOPHIL ABSOLUTE 0.08 0.00 - 0.80 x10^3/uL    BASOPHIL ABSOLUTE 0.16 0.00 - 0.30 x10^3/uL    METAMYELOCYTE ABSOLUTE      MYELOCYTE ABSOLUTE      PROMYELOCYTE ABSOLUTE      BLAST ABSOLUTE      OTHER CELL ABSOLUTE      ANISOCYTOSIS 1+     POLYCHROMASIA      POIKILOCYTOSIS      BASOPHILIC STIPPLING      MICROCYTOSIS      MACROCYTOSIS      ROULEAUX      SCHISTOCYTES      SPHEROCYTES      TARGET CELLS      TEARDROP CELLS      OVALOCYTE (ELLIPTOCYTE)      CRENATED RED CELLS      STOMATOCYTES  ACANTHOCYTES (SPUR CELL)      ECHINOCYTE (BURR CELL)       BLISTER CELLS      RBC AGGLUTINATES      HOWELL JOLLY BODIES      ATYPICAL LYMPHOCYTES      TOXIC GRANULATION      DOHLE BODIES      TOXIC VACUOLIZATION      AUER RODS      BASKET CELLS      HYPERSEGMENTATION      LARGE PLATELETS      PLATELET CLUMPS      WBC MORPHOLOGY COMMENT      RBC MORPHOLOGY COMMENT      PLATELET MORPHOLOGY COMMENT Adequate     BANDS NEUTROPHILS MANUAL      BAND ABSOLUTE      NEUTROPHILS MANUAL 75     LYMPHOCYTES MANUAL 16     MONOCYTES MANUAL 6     EOSINOPHILS MANUAL 1     BASOPHILS MANUAL 2     PROMYELOCYTES MANUAL      MYELOCYTES MANUAL      METAMYELOCYTES MANUAL      BLASTS MANUAL      TOTAL CELLS COUNTED [#] IN BLOOD 100     OTHER CELLS MANUAL      NUCLEATED RBC MANUAL      PLASMA CELL %      PLASMA CELL ABSOLUE      PLASMA CELLS MANUAL      HYPOCHROMASIA     PT/INR   Result Value Ref Range    PROTHROMBIN TIME 12.6 (H) 9.2 - 12.1 seconds    INR 1.19 (H) 0.88 - 1.10   LIPASE   Result Value Ref Range    LIPASE 113 73 - 393 U/L   COMPREHENSIVE METABOLIC PANEL, NON-FASTING   Result Value Ref Range    SODIUM 134 (L) 136 - 145 mmol/L    POTASSIUM 4.0 3.5 - 5.1 mmol/L    CHLORIDE 94 (L) 98 - 107 mmol/L    CO2 TOTAL 27 21 - 32 mmol/L    ANION GAP 13 10 - 20 mmol/L    BUN 26 (H) 7 - 18 mg/dL    CREATININE 8.07 (H) 0.55 - 1.02 mg/dL    BUN/CREA RATIO 3     ESTIMATED GFR 6 (L) >59 mL/min/1.81m^2    ALBUMIN 3.5 3.4 - 5.0 g/dL    CALCIUM 10.0 8.5 - 10.1 mg/dL    GLUCOSE 95 74 - 106 mg/dL    ALKALINE PHOSPHATASE 77 46 - 116 U/L    ALT (SGPT) 12 <=78 U/L    AST (SGOT) 18 15 - 37 U/L    BILIRUBIN TOTAL 0.6 0.2 - 1.0 mg/dL    PROTEIN TOTAL 8.1 6.4 - 8.2 g/dL    ALBUMIN/GLOBULIN RATIO 0.8 0.8 - 1.4    OSMOLALITY, CALCULATED 273 270 - 290 mOsm/kg    CALCIUM, CORRECTED 10.5 mg/dL    GLOBULIN 4.6    TROPONIN-I (Q3H X 3)   Result Value Ref Range    TROPONIN I 79 (H) <15 ng/L        Radiology:  XR CHEST AP    XR CHEST AP   Final Result   Increased interstitial markings are again noted in the mid the lower lungs  bilaterally which appear less prominent than on the last study.            Radiologist location ID: [id number]  ECG:     Normal sinus rhythm ventricular rate 75 PR interval 2 full nonspecific ST-T changes          Differential diagnosis  Atypical chest pain, nausea and vomiting, chronic renal failure    Course/Disposition/Plan     Course:     ED Course as of 06/27/21 1636   Thu Jun 27, 2021   1138 Patient's accepted to the hospitalist service, Dr. Shon Hough, telemetry [place].   2694 The patient states that she has no longer gotten chest pain, abdominal pain is also resolved and she is feeling somewhat better.  Blood pressure is uncontrolled, she states her blood pressure is always uncontrolled she does not sleep during the night due to seeing things of happened in the past, traumatic events.  She does state that she does urinate, she does take all of her medicines, and that she goes to dialysis but feels that reducing her dry weight continually is part of the problem with her blood pressure as well.  She says she has seen Nephrology, pulmonology, Cardiology and still can not get her blood pressure down.  She has not seen Endocrinology, she also states she is not had a 24 urine in over 3 years and she thinks that maybe there is something there that could help with her blood pressure control.  At this point she does want to go home, and does not want to be admitted to the hospital, will cancel the admission.  She is aware of the risk with blood pressure of having stroke heart attack or other complications of uncontrolled high blood pressure, however, she states her blood pressures been uncontrolled essentially since 2001.  She is going to sign AMA, I did recommend following up with her primary care provider, consider seeing Endocrinology and psychology for her anxiety and depression, and likely some degree of PTSD.   Patient was seen and evaluated prior to calling hospitalist, still continues to have  right and left upper quadrant abdominal pain, came in with chest pain, and elevated troponins.  Medically noncompliant blood pressure is better controlled after receiving hydralazine in the ER earlier.  Be admitted to the service of the hospitalist.    Disposition:    Admitted    Condition at Disposition:    Stable    Follow up:   No follow-up provider specified.    Clinical Impression:     Clinical Impression   Chest pain, unspecified type (Primary)   Elevated troponin I level   Benign hypertensive CKD, stage 5 chronic kidney disease or end stage renal disease (CMS HCC)   Right upper quadrant pain   Left upper quadrant pain   Personal history of noncompliance with medical treatment and regimen         Winfred Burn, MD     ..Bretta Bang, DO  06/27/2021, 11:40

## 2021-06-30 ENCOUNTER — Emergency Department (HOSPITAL_BASED_OUTPATIENT_CLINIC_OR_DEPARTMENT_OTHER): Payer: Commercial Managed Care - PPO

## 2021-06-30 ENCOUNTER — Observation Stay (HOSPITAL_COMMUNITY): Payer: Commercial Managed Care - PPO | Admitting: Internal Medicine

## 2021-06-30 ENCOUNTER — Observation Stay
Admission: EM | Admit: 2021-06-30 | Discharge: 2021-07-01 | Disposition: A | Discharge status: Home / Self Care | Payer: Commercial Managed Care - PPO | Attending: Internal Medicine | Admitting: Internal Medicine

## 2021-06-30 ENCOUNTER — Encounter (HOSPITAL_BASED_OUTPATIENT_CLINIC_OR_DEPARTMENT_OTHER): Payer: Self-pay

## 2021-06-30 ENCOUNTER — Other Ambulatory Visit: Payer: Self-pay

## 2021-06-30 DIAGNOSIS — G4730 Sleep apnea, unspecified: Secondary | ICD-10-CM

## 2021-06-30 DIAGNOSIS — I517 Cardiomegaly: Secondary | ICD-10-CM | POA: Diagnosis present

## 2021-06-30 DIAGNOSIS — G8929 Other chronic pain: Secondary | ICD-10-CM

## 2021-06-30 DIAGNOSIS — Z91158 Patient's noncompliance with renal dialysis for other reason: Secondary | ICD-10-CM | POA: Insufficient documentation

## 2021-06-30 DIAGNOSIS — J45909 Unspecified asthma, uncomplicated: Secondary | ICD-10-CM

## 2021-06-30 DIAGNOSIS — N186 End stage renal disease: Secondary | ICD-10-CM | POA: Insufficient documentation

## 2021-06-30 DIAGNOSIS — R112 Nausea with vomiting, unspecified: Secondary | ICD-10-CM

## 2021-06-30 DIAGNOSIS — F17210 Nicotine dependence, cigarettes, uncomplicated: Secondary | ICD-10-CM | POA: Insufficient documentation

## 2021-06-30 DIAGNOSIS — N2581 Secondary hyperparathyroidism of renal origin: Secondary | ICD-10-CM | POA: Insufficient documentation

## 2021-06-30 DIAGNOSIS — D631 Anemia in chronic kidney disease: Secondary | ICD-10-CM

## 2021-06-30 DIAGNOSIS — Z992 Dependence on renal dialysis: Secondary | ICD-10-CM | POA: Insufficient documentation

## 2021-06-30 DIAGNOSIS — I252 Old myocardial infarction: Secondary | ICD-10-CM

## 2021-06-30 DIAGNOSIS — I132 Hypertensive heart and chronic kidney disease with heart failure and with stage 5 chronic kidney disease, or end stage renal disease: Principal | ICD-10-CM | POA: Insufficient documentation

## 2021-06-30 DIAGNOSIS — D649 Anemia, unspecified: Secondary | ICD-10-CM | POA: Insufficient documentation

## 2021-06-30 DIAGNOSIS — I2510 Atherosclerotic heart disease of native coronary artery without angina pectoris: Secondary | ICD-10-CM | POA: Insufficient documentation

## 2021-06-30 DIAGNOSIS — I119 Hypertensive heart disease without heart failure: Secondary | ICD-10-CM | POA: Diagnosis present

## 2021-06-30 DIAGNOSIS — I5032 Chronic diastolic (congestive) heart failure: Secondary | ICD-10-CM | POA: Insufficient documentation

## 2021-06-30 DIAGNOSIS — Z79899 Other long term (current) drug therapy: Secondary | ICD-10-CM | POA: Insufficient documentation

## 2021-06-30 DIAGNOSIS — I10 Essential (primary) hypertension: Secondary | ICD-10-CM

## 2021-06-30 DIAGNOSIS — R079 Chest pain, unspecified: Secondary | ICD-10-CM

## 2021-06-30 DIAGNOSIS — I509 Heart failure, unspecified: Secondary | ICD-10-CM

## 2021-06-30 DIAGNOSIS — R778 Other specified abnormalities of plasma proteins: Secondary | ICD-10-CM

## 2021-06-30 DIAGNOSIS — K219 Gastro-esophageal reflux disease without esophagitis: Secondary | ICD-10-CM

## 2021-06-30 DIAGNOSIS — R400 Somnolence: Secondary | ICD-10-CM

## 2021-06-30 DIAGNOSIS — R9431 Abnormal electrocardiogram [ECG] [EKG]: Secondary | ICD-10-CM

## 2021-06-30 DIAGNOSIS — I350 Nonrheumatic aortic (valve) stenosis: Secondary | ICD-10-CM | POA: Diagnosis present

## 2021-06-30 DIAGNOSIS — R110 Nausea: Secondary | ICD-10-CM | POA: Diagnosis present

## 2021-06-30 DIAGNOSIS — Z91199 Patient's noncompliance with other medical treatment and regimen due to unspecified reason: Secondary | ICD-10-CM | POA: Diagnosis present

## 2021-06-30 DIAGNOSIS — R109 Unspecified abdominal pain: Secondary | ICD-10-CM

## 2021-06-30 HISTORY — DX: Personal history of diseases of the blood and blood-forming organs and certain disorders involving the immune mechanism: Z86.2

## 2021-06-30 HISTORY — DX: Nonrheumatic mitral (valve) insufficiency: I34.0

## 2021-06-30 HISTORY — DX: Chronic diastolic (congestive) heart failure (CMS HCC): I50.32

## 2021-06-30 HISTORY — DX: Rheumatic tricuspid insufficiency: I07.1

## 2021-06-30 HISTORY — DX: Chronic kidney disease, unspecified: N18.9

## 2021-06-30 HISTORY — DX: Acute myocardial infarction, unspecified (CMS HCC): I21.9

## 2021-06-30 HISTORY — DX: Essential (primary) hypertension: I10

## 2021-06-30 HISTORY — DX: Chronic pulmonary edema: J81.1

## 2021-06-30 LAB — CBC WITH DIFF
BASOPHIL #: 0.04 10*3/uL (ref 0.00–0.30)
BASOPHIL %: 0 % (ref 0–3)
EOSINOPHIL #: 0.09 10*3/uL (ref 0.00–0.80)
EOSINOPHIL %: 1 % (ref 0–7)
HCT: 29 % — ABNORMAL LOW (ref 37.0–47.0)
HGB: 9.4 g/dL — ABNORMAL LOW (ref 12.5–16.0)
LYMPHOCYTE #: 1.1 10*3/uL (ref 1.10–5.00)
LYMPHOCYTE %: 9 % — ABNORMAL LOW (ref 25–45)
MCH: 30.7 pg (ref 27.0–32.0)
MCHC: 32.6 g/dL (ref 32.0–36.0)
MCV: 94.5 fL (ref 78.0–99.0)
MONOCYTE #: 0.51 10*3/uL (ref 0.00–1.30)
MONOCYTE %: 4 % (ref 0–12)
MPV: 7.6 fL (ref 7.4–10.4)
NEUTROPHIL #: 10.14 10*3/uL — ABNORMAL HIGH (ref 1.80–8.40)
NEUTROPHIL %: 85 % — ABNORMAL HIGH (ref 40–76)
PLATELETS: 331 10*3/uL (ref 140–440)
RBC: 3.07 10*6/uL — ABNORMAL LOW (ref 4.20–5.40)
RDW: 19.2 % — ABNORMAL HIGH (ref 11.6–14.8)
WBC: 11.9 10*3/uL — ABNORMAL HIGH (ref 4.0–10.5)

## 2021-06-30 LAB — COMPREHENSIVE METABOLIC PANEL, NON-FASTING
ALBUMIN/GLOBULIN RATIO: 0.7 — ABNORMAL LOW (ref 0.8–1.4)
ALBUMIN: 3.8 g/dL (ref 3.4–5.0)
ALKALINE PHOSPHATASE: 85 U/L (ref 46–116)
ALT (SGPT): 12 U/L (ref <=78)
ANION GAP: 16 mmol/L (ref 10–20)
AST (SGOT): 20 U/L (ref 15–37)
BILIRUBIN TOTAL: 0.7 mg/dL (ref 0.2–1.0)
BUN/CREA RATIO: 3
BUN: 26 mg/dL — ABNORMAL HIGH (ref 7–18)
CALCIUM, CORRECTED: 11.2 mg/dL
CALCIUM: 11 mg/dL — ABNORMAL HIGH (ref 8.5–10.1)
CHLORIDE: 90 mmol/L — ABNORMAL LOW (ref 98–107)
CO2 TOTAL: 25 mmol/L (ref 21–32)
CREATININE: 8.78 mg/dL — ABNORMAL HIGH (ref 0.55–1.02)
ESTIMATED GFR: 5 mL/min/{1.73_m2} — ABNORMAL LOW (ref >59)
GLOBULIN: 5.1
GLUCOSE: 105 mg/dL (ref 74–106)
OSMOLALITY, CALCULATED: 268 mOsm/kg — ABNORMAL LOW (ref 270–290)
POTASSIUM: 4.2 mmol/L (ref 3.5–5.1)
PROTEIN TOTAL: 8.9 g/dL — ABNORMAL HIGH (ref 6.4–8.2)
SODIUM: 131 mmol/L — ABNORMAL LOW (ref 136–145)

## 2021-06-30 LAB — TROPONIN-I
TROPONIN I: 69 ng/L — ABNORMAL HIGH (ref <15)
TROPONIN I: 59 ng/L — ABNORMAL HIGH (ref <15)
TROPONIN I: 60 ng/L — ABNORMAL HIGH (ref <15)
TROPONIN I: 42 ng/L — ABNORMAL HIGH (ref <15)
TROPONIN I: 43 ng/L — ABNORMAL HIGH (ref <15)

## 2021-06-30 LAB — ECG 12 LEAD
Atrial Rate: 82 {beats}/min
Calculated P Axis: 71 degrees
Calculated R Axis: 4 degrees
Calculated T Axis: 95 degrees
PR Interval: 214 ms
QRS Duration: 104 ms
QT Interval: 438 ms
QTC Calculation: 511 ms
Ventricular rate: 82 {beats}/min

## 2021-06-30 LAB — LACTIC ACID LEVEL W/ REFLEX FOR LEVEL >2.0: LACTIC ACID: 1.1 mmol/L (ref 0.4–2.0)

## 2021-06-30 LAB — LIPASE: LIPASE: 104 U/L (ref 73–393)

## 2021-06-30 MED ORDER — SODIUM CHLORIDE 0.9 % (FLUSH) INJECTION SYRINGE
3.0000 mL | INJECTION | Freq: Three times a day (TID) | INTRAMUSCULAR | Status: DC
Start: 2021-06-30 — End: 2021-07-01
  Administered 2021-06-30: 3 mL
  Administered 2021-06-30 – 2021-07-01 (×2): 0 mL
  Administered 2021-07-01: 3 mL

## 2021-06-30 MED ORDER — NITROGLYCERIN 2 % TRANSDERMAL OINTMENT - PACKET
TOPICAL_OINTMENT | TRANSDERMAL | Status: AC
Start: 2021-06-30 — End: 2021-06-30
  Filled 2021-06-30: qty 1

## 2021-06-30 MED ORDER — DEXAMETHASONE SODIUM PHOSPHATE (PF) 10 MG/ML INJECTION SOLUTION
INTRAMUSCULAR | Status: AC
Start: 2021-06-30 — End: 2021-06-30
  Filled 2021-06-30: qty 1

## 2021-06-30 MED ORDER — DEXAMETHASONE SODIUM PHOSPHATE (PF) 10 MG/ML INJECTION SOLUTION
10.0000 mg | INTRAMUSCULAR | Status: AC
Start: 2021-06-30 — End: 2021-06-30
  Administered 2021-06-30: 10 mg via INTRAVENOUS

## 2021-06-30 MED ORDER — MAGNESIUM SULFATE 1 GRAM/100 ML IN DEXTROSE 5 % INTRAVENOUS PIGGYBACK
1.0000 g | INJECTION | INTRAVENOUS | Status: AC
Start: 2021-06-30 — End: 2021-06-30
  Administered 2021-06-30 (×2): 0 g via INTRAVENOUS
  Administered 2021-06-30 (×2): 1 g via INTRAVENOUS

## 2021-06-30 MED ORDER — CINACALCET 30 MG TABLET
60.0000 mg | ORAL_TABLET | Freq: Every morning | ORAL | Status: DC
Start: 2021-07-01 — End: 2021-07-01
  Administered 2021-07-01: 0 mg via ORAL
  Filled 2021-06-30 (×2): qty 2

## 2021-06-30 MED ORDER — MIDODRINE 2.5 MG TABLET
5.0000 mg | ORAL_TABLET | Freq: Every day | ORAL | Status: DC | PRN
Start: 2021-06-30 — End: 2021-07-01
  Administered 2021-07-01: 5 mg via ORAL
  Filled 2021-06-30: qty 2

## 2021-06-30 MED ORDER — LOSARTAN 50 MG TABLET
100.0000 mg | ORAL_TABLET | Freq: Every day | ORAL | Status: DC
Start: 2021-06-30 — End: 2021-07-01
  Administered 2021-06-30 – 2021-07-01 (×2): 100 mg via ORAL
  Filled 2021-06-30 (×2): qty 2

## 2021-06-30 MED ORDER — PROCHLORPERAZINE EDISYLATE 10 MG/2 ML (5 MG/ML) INJECTION SOLUTION
10.0000 mg | INTRAMUSCULAR | Status: AC
Start: 2021-06-30 — End: 2021-06-30
  Administered 2021-06-30: 10 mg via INTRAVENOUS

## 2021-06-30 MED ORDER — ONDANSETRON HCL (PF) 4 MG/2 ML INJECTION SOLUTION
4.0000 mg | Freq: Three times a day (TID) | INTRAMUSCULAR | Status: DC | PRN
Start: 2021-06-30 — End: 2021-07-01

## 2021-06-30 MED ORDER — FENTANYL (PF) 50 MCG/ML INJECTION SOLUTION
50.0000 ug | INTRAMUSCULAR | Status: AC
Start: 2021-06-30 — End: 2021-06-30
  Administered 2021-06-30: 50 ug via INTRAVENOUS

## 2021-06-30 MED ORDER — HYDRALAZINE 20 MG/ML INJECTION SOLUTION
INTRAMUSCULAR | Status: AC
Start: 2021-06-30 — End: 2021-06-30
  Filled 2021-06-30: qty 1

## 2021-06-30 MED ORDER — HYDRALAZINE 20 MG/ML INJECTION SOLUTION
10.0000 mg | INTRAMUSCULAR | Status: AC
Start: 2021-06-30 — End: 2021-06-30
  Administered 2021-06-30: 10 mg via INTRAVENOUS

## 2021-06-30 MED ORDER — MAGNESIUM SULFATE 1 GRAM/100 ML IN DEXTROSE 5 % INTRAVENOUS PIGGYBACK
INJECTION | INTRAVENOUS | Status: AC
Start: 2021-06-30 — End: 2021-06-30
  Filled 2021-06-30: qty 200

## 2021-06-30 MED ORDER — LABETALOL 200 MG TABLET
200.0000 mg | ORAL_TABLET | Freq: Two times a day (BID) | ORAL | Status: DC
Start: 2021-06-30 — End: 2021-07-01
  Administered 2021-06-30 – 2021-07-01 (×2): 200 mg via ORAL
  Filled 2021-06-30 (×2): qty 1

## 2021-06-30 MED ORDER — ONDANSETRON HCL (PF) 4 MG/2 ML INJECTION SOLUTION
4.0000 mg | INTRAMUSCULAR | Status: AC
Start: 2021-06-30 — End: 2021-06-30
  Administered 2021-06-30: 4 mg via INTRAVENOUS

## 2021-06-30 MED ORDER — CLONIDINE 0.2 MG/24 HR WEEKLY TRANSDERMAL PATCH
0.3000 mg | MEDICATED_PATCH | TRANSDERMAL | Status: DC
Start: 2021-06-30 — End: 2021-07-01
  Administered 2021-06-30: 0.3 mg via TRANSDERMAL
  Filled 2021-06-30: qty 1

## 2021-06-30 MED ORDER — ONDANSETRON HCL (PF) 4 MG/2 ML INJECTION SOLUTION
INTRAMUSCULAR | Status: AC
Start: 2021-06-30 — End: 2021-06-30
  Filled 2021-06-30: qty 2

## 2021-06-30 MED ORDER — HEPARIN (PORCINE) 5,000 UNIT/ML INJECTION SOLUTION
5000.0000 [IU] | Freq: Three times a day (TID) | INTRAMUSCULAR | Status: DC
Start: 2021-06-30 — End: 2021-07-01
  Administered 2021-06-30 – 2021-07-01 (×4): 0 [IU] via SUBCUTANEOUS
  Filled 2021-06-30 (×2): qty 1

## 2021-06-30 MED ORDER — NITROGLYCERIN 0.4 MG SUBLINGUAL TABLET
SUBLINGUAL_TABLET | SUBLINGUAL | Status: AC
Start: 2021-06-30 — End: 2021-06-30
  Filled 2021-06-30: qty 1

## 2021-06-30 MED ORDER — SEVELAMER CARBONATE 800 MG TABLET
2400.0000 mg | ORAL_TABLET | Freq: Three times a day (TID) | ORAL | Status: DC
Start: 2021-06-30 — End: 2021-07-01
  Administered 2021-06-30 – 2021-07-01 (×3): 0 mg via ORAL
  Filled 2021-06-30 (×2): qty 3

## 2021-06-30 MED ORDER — MAGNESIUM SULFATE 1 GRAM/100 ML IN DEXTROSE 5 % INTRAVENOUS PIGGYBACK
1.0000 g | INJECTION | Freq: Once | INTRAVENOUS | Status: AC
Start: 2021-06-30 — End: 2021-06-30
  Administered 2021-06-30: 1 g via INTRAVENOUS

## 2021-06-30 MED ORDER — NITROGLYCERIN 2 % TRANSDERMAL OINTMENT - PACKET
1.0000 [in_us] | TOPICAL_OINTMENT | Freq: Two times a day (BID) | TRANSDERMAL | Status: DC
Start: 2021-06-30 — End: 2021-07-01
  Administered 2021-06-30 – 2021-07-01 (×3): 1 [in_us] via TOPICAL
  Filled 2021-06-30 (×2): qty 1

## 2021-06-30 MED ORDER — FENTANYL (PF) 50 MCG/ML INJECTION SOLUTION
INTRAMUSCULAR | Status: AC
Start: 2021-06-30 — End: 2021-06-30
  Filled 2021-06-30: qty 2

## 2021-06-30 MED ORDER — NITROGLYCERIN 0.4 MG SUBLINGUAL TABLET
0.4000 mg | SUBLINGUAL_TABLET | SUBLINGUAL | Status: AC
Start: 2021-06-30 — End: 2021-06-30
  Administered 2021-06-30: 0.4 mg via SUBLINGUAL

## 2021-06-30 MED ORDER — SCOPOLAMINE 1 MG OVER 3 DAYS TRANSDERMAL PATCH
1.0000 | MEDICATED_PATCH | TRANSDERMAL | Status: DC
Start: 2021-06-30 — End: 2021-07-01
  Administered 2021-06-30: 0 via TRANSDERMAL

## 2021-06-30 MED ORDER — SPIRONOLACTONE 25 MG TABLET
50.0000 mg | ORAL_TABLET | Freq: Two times a day (BID) | ORAL | Status: DC
Start: 2021-07-01 — End: 2021-07-01
  Administered 2021-07-01: 50 mg via ORAL
  Filled 2021-06-30: qty 2

## 2021-06-30 MED ORDER — PROCHLORPERAZINE EDISYLATE 10 MG/2 ML (5 MG/ML) INJECTION SOLUTION
INTRAMUSCULAR | Status: AC
Start: 2021-06-30 — End: 2021-06-30
  Filled 2021-06-30: qty 2

## 2021-06-30 MED ORDER — SODIUM CHLORIDE 0.9 % (FLUSH) INJECTION SYRINGE
3.0000 mL | INJECTION | INTRAMUSCULAR | Status: DC | PRN
Start: 2021-06-30 — End: 2021-07-01

## 2021-06-30 NOTE — Nurses Notes (Signed)
After fentanyl patient has been resting well. Continues to report 6/10 pain in midsternal chest region. Denies any nausea at present after Zofran and compazine administration. Patient oxygen saturation dropped down to 89% on RA, provider informed and 2L NC ordered and placed.

## 2021-06-30 NOTE — Nurses Notes (Signed)
Patient reports 10/10 stabbing chest pain, excessive vomiting, and diaphoresis for approximately two hours that awoke her out of her sleep. Patient appears to be in distress, diaphoretic, vomiting clear/yellow stomach bile, reports 10/10 midsternal chest pain non-radiating for one hour. Patient did have dialysis yesterday. States, "I do not feel good, my chest and my stomach are hurting so bad". Unable to sit still d/t discomfort. C/o being hot.

## 2021-06-30 NOTE — H&P (Signed)
[name]    HOSPITALIST H&P    [name] 46 y.o. female 429/B   Date of Service: 06/30/2021    Date of Admission:  06/30/2021   PCP: Cam Hai, FNP Code Status:Full Code       Chief Complaint:  Chest pain    HPI:   [name] is a 46 year old female who presented to the [facility] ER on 06/30/21 with complaints of nausea, vomiting, chest pain, abdominal pain for the previous few hours prior to coming to the ER per ER notes.  The patient was accepted for observation admission to [facility] telemetry floor from [facility] ER.  The patient was seen and examined for hospitalist admission after arrival to [facility] from [facility] [place].  The patient was lying in bed upon arrival to room.  She was asked about symptoms and she stated "I don't know, I don't remember calling 911 to come and get me."  Patient drowsy, but able to be aroused.  The patient became angry upon awakening and stated that she is claustrophobic, likes her room cold, and wants her own TV.  She is in a semi-private room because that is the only bed available.  She stated that she was going to sign out AMA if she did not get a private room.  She was strongly encouraged to stay, but was made aware that we would not be able to keep her here if she decided to leave.  Labs upon arrival to ER were as follows:  WBC 11.9, Hgb 9.4, sodium 131, BUN 26, creatinine 8.78, lactic acid 1.1, troponin 69/59/60 respectively.  KUB negative.  EKG showing sinus rhythm with first degree AV block.  Blood pressure upon arrival to ER was 216/145, but has since come down to 173/115.          ED medications:   Medications Administered in the ED   scopolamine 1 mg over 3 days transdermal patch (0 Patches Transdermal Not Given 06/30/21 0900)   nitroGLYCERIN (NITRO-BID) 2 % topical ointment (1 Inch Apply Topically Given 06/30/21 0846)   fentaNYL (SUBLIMAZE) 50 mcg/mL injection (50 mcg Intravenous Given 06/30/21 0715)   magnesium sulfate 1 G in D5W 100 mL premix IVPB (0 g  Intravenous Stopped 06/30/21 0857)   dexamethasone (PF) 10 mg/mL injection (10 mg Intravenous Given 06/30/21 0727)   ondansetron (ZOFRAN) 2 mg/mL injection (4 mg Intravenous Given 06/30/21 0839)   nitroGLYCERIN (NITROSTAT) sublingual tablet (0.4 mg Sublingual Given 06/30/21 0839)   magnesium sulfate 1 G in D5W 100 mL premix IVPB (1 g Intravenous New Bag/New Syringe 06/30/21 1128)   prochlorperazine (COMPAZINE) 5 mg/mL injection (10 mg Intravenous Given 06/30/21 0846)   hydrALAZINE (APRESOLINE) injection 10 mg (10 mg Intravenous Given 06/30/21 1126)         PMHx:    Past Medical History:   Diagnosis Date    Asthma     Chronic diastolic CHF (congestive heart failure) (CMS HCC)     Esophageal reflux     ESRD (end stage renal disease) (CMS HCC)     History of anemia due to CKD     Hypertension     MI (myocardial infarction) (CMS HCC)     Mitral valve regurgitation     Pulmonary edema     Sleep apnea     Tricuspid valve regurgitation         PSHx:   Past Surgical History:   Procedure Laterality Date    ESOPHAGOGASTRODUODENOSCOPY  HX BACK SURGERY      HX CHOLECYSTECTOMY      HX HYSTERECTOMY      HX TONSILLECTOMY            Allergies:    Allergies   Allergen Reactions    Lisinopril Swelling    Social History  Social History     Tobacco Use    Smoking status: Every Day     Packs/day: 0.50     Types: Cigarettes    Smokeless tobacco: Never   Vaping Use    Vaping Use: Never used   Substance Use Topics    Alcohol use: Not Currently    Drug use: Yes     Types: Marijuana       Family History  Family Medical History:       Problem Relation (Age of Onset)    Breast Cancer Mother    Coronary Artery Disease Mother    Diabetes type II Mother    Hypertension (High Blood Pressure) Father               Home Meds:      Prior to Admission medications    Medication Sig Start Date End Date Taking? Authorizing Provider   albuterol sulfate (PROVENTIL OR VENTOLIN OR PROAIR) 90 mcg/actuation Inhalation oral inhaler Take 1-2 Puffs by inhalation Every 6  hours as needed    Provider, Historical   aspirin (ECOTRIN) 81 mg Oral Tablet, Delayed Release (E.C.) Take 1 Tablet (81 mg total) by mouth Once a day    Provider, Historical   carvediloL (COREG) 25 mg Oral Tablet Take 1 Tablet (25 mg total) by mouth Twice daily for 30 days 12/13/20 01/12/21  Buena Irish, MD   cloNIDine HCL (CATAPRES) 0.1 mg Oral Tablet Take 1 Tablet (0.1 mg total) by mouth in the morning and 1 Tablet (0.1 mg total) at noon and 1 Tablet (0.1 mg total) before bedtime.    Provider, Historical   clopidogreL (PLAVIX) 75 mg Oral Tablet Take 1 Tablet (75 mg total) by mouth Once a day for 30 days 12/14/20 01/13/21  Murugu-Knopf, Lars Pinks, MD   dilTIAZem HCL (CARDIZEM) 120 mg Oral Tablet Take 1 Tablet (120 mg total) by mouth in the morning and 1 Tablet (120 mg total) at noon and 1 Tablet (120 mg total) before bedtime.    Provider, Historical   doxazosin (CARDURA) 4 mg Oral Tablet Take 1 Tablet (4 mg total) by mouth Every evening    Provider, Historical   furosemide (LASIX) 80 mg Oral Tablet Take 1 Tablet (80 mg total) by mouth in the morning and 1 Tablet (80 mg total) in the evening.    Provider, Historical   hydrALAZINE (APRESOLINE) 100 mg Oral Tablet Take 1 Tablet (100 mg total) by mouth Three times a day for 30 days 12/13/20 01/12/21  Buena Irish, MD   hydrOXYzine pamoate (VISTARIL) 25 mg Oral Capsule Take 1 Capsule (25 mg total) by mouth Every night as needed for Itching    Provider, Historical   isosorbide mononitrate (IMDUR) 60 mg Oral Tablet Sustained Release 24 hr Take 1 Tablet (60 mg total) by mouth Every morning    Provider, Historical   labetaloL (NORMODYNE) 200 mg Oral Tablet Take 1 Tablet (200 mg total) by mouth in the morning and 1 Tablet (200 mg total) at noon and 1 Tablet (200 mg total) before bedtime. 1.5 tabs.    Provider, Historical   lactulose (ENULOSE) 10 gram/15 mL Oral Solution Take 30 mL  by mouth Once a day for 3 days 06/04/21 06/07/21  Regina Eck, DO    loratadine (CLARITIN) 10 mg Oral Tablet Take 1 Tablet (10 mg total) by mouth Once a day    Provider, Historical   losartan (COZAAR) 50 mg Oral Tablet Take 1 Tablet (50 mg total) by mouth Once a day    Provider, Historical   montelukast (SINGULAIR) 10 mg Oral Tablet Take 1 Tablet (10 mg total) by mouth Every evening    Provider, Historical   nitroGLYCERIN (NITROSTAT) 0.4 mg Sublingual Tablet, Sublingual Place 1 Tablet (0.4 mg total) under the tongue Every 5 minutes for 3 doses for 3 doses over 15 minutes 06/23/21 06/24/21  Elder, Gildardo Cranker, MD   oxyCODONE-acetaminophen (PERCOCET) 5-325 mg Oral Tablet Take 1 Tablet by mouth Every 4 hours as needed 12/13/20   Murugu-Knopf, Roselyne, MD   pantoprazole (PROTONIX) 40 mg Oral Tablet, Delayed Release (E.C.) Take 1 Tablet (40 mg total) by mouth Once a day    Provider, Historical   polyethylene glycol (MIRALAX) 17 gram Oral Powder in Packet Take 1 Packet (17 g total) by mouth Once a day 12/14/20   Murugu-Knopf, Roselyne, MD   sennosides-docusate sodium (SENOKOT-S) 8.6-50 mg Oral Tablet Take 1 Tablet by mouth Twice daily 12/13/20   Murugu-Knopf, Roselyne, MD   spironolactone (ALDACTONE) 25 mg Oral Tablet Take 1 Tablet (25 mg total) by mouth Once a day    Provider, Historical          ROS:   Review of systems complete and is negative other than what is discussed in HPI.  Patient not cooperative with ROS.          Results for orders placed or performed during the hospital encounter of 06/30/21 (from the past 24 hour(s))   ECG 12 LEAD   Result Value Ref Range    Ventricular rate 82 BPM    Atrial Rate 82 BPM    PR Interval 214 ms    QRS Duration 104 ms    QT Interval 438 ms    QTC Calculation 511 ms    Calculated P Axis 71 degrees    Calculated R Axis 4 degrees    Calculated T Axis 95 degrees   COMPREHENSIVE METABOLIC PANEL, NON-FASTING   Result Value Ref Range    SODIUM 131 (L) 136 - 145 mmol/L    POTASSIUM 4.2 3.5 - 5.1 mmol/L    CHLORIDE 90 (L) 98 - 107 mmol/L    CO2 TOTAL 25 21 -  32 mmol/L    ANION GAP 16 10 - 20 mmol/L    BUN 26 (H) 7 - 18 mg/dL    CREATININE 8.78 (H) 0.55 - 1.02 mg/dL    BUN/CREA RATIO 3     ESTIMATED GFR 5 (L) >59 mL/min/1.63m^2    ALBUMIN 3.8 3.4 - 5.0 g/dL    CALCIUM 11.0 (H) 8.5 - 10.1 mg/dL    GLUCOSE 105 74 - 106 mg/dL    ALKALINE PHOSPHATASE 85 46 - 116 U/L    ALT (SGPT) 12 <=78 U/L    AST (SGOT) 20 15 - 37 U/L    BILIRUBIN TOTAL 0.7 0.2 - 1.0 mg/dL    PROTEIN TOTAL 8.9 (H) 6.4 - 8.2 g/dL    ALBUMIN/GLOBULIN RATIO 0.7 (L) 0.8 - 1.4    OSMOLALITY, CALCULATED 268 (L) 270 - 290 mOsm/kg    CALCIUM, CORRECTED 11.2 mg/dL    GLOBULIN 5.1    CBC WITH DIFF   Result Value  Ref Range    WBCS UNCORRECTED      WBC 11.9 (H) 4.0 - 10.5 x10^3/uL    RBC 3.07 (L) 4.20 - 5.40 x10^6/uL    HGB 9.4 (L) 12.5 - 16.0 g/dL    HCT 29.0 (L) 37.0 - 47.0 %    MCV 94.5 78.0 - 99.0 fL    MCH 30.7 27.0 - 32.0 pg    MCHC 32.6 32.0 - 36.0 g/dL    RDW 19.2 (H) 11.6 - 14.8 %    PLATELETS 331 140 - 440 x10^3/uL    MPV 7.6 7.4 - 10.4 fL    NEUTROPHIL % 85 (H) 40 - 76 %    LYMPHOCYTE % 9 (L) 25 - 45 %    MONOCYTE % 4 0 - 12 %    EOSINOPHIL % 1 0 - 7 %    BASOPHIL % 0 0 - 3 %    NEUTROPHIL # 10.14 (H) 1.80 - 8.40 x10^3/uL    LYMPHOCYTE # 1.10 1.10 - 5.00 x10^3/uL    MONOCYTE # 0.51 0.00 - 1.30 x10^3/uL    EOSINOPHIL # 0.09 0.00 - 0.80 x10^3/uL    BASOPHIL # 0.04 0.00 - 0.30 x10^3/uL   TROPONIN NOW   Result Value Ref Range    TROPONIN I 69 (H) <15 ng/L   LIPASE   Result Value Ref Range    LIPASE 104 73 - 393 U/L   LACTIC ACID LEVEL W/ REFLEX FOR LEVEL >2.0   Result Value Ref Range    LACTIC ACID 1.1 0.4 - 2.0 mmol/L   TROPONIN IN ONE HOUR   Result Value Ref Range    TROPONIN I 59 (H) <15 ng/L   TROPONIN IN THREE HOURS   Result Value Ref Range    TROPONIN I 60 (H) <15 ng/L          Physical:  Filed Vitals:    06/30/21 1130 06/30/21 1200 06/30/21 1230 06/30/21 1400   BP: (!) 170/113 (!) 155/102 (!) 153/110 (!) 173/115   Pulse: 78 79 77 86   Resp: 18 16 16 18    Temp:    36.8 C (98.2 F)   SpO2: 98% 95% 94% 93%       General: Patient is alert and oriented to person, place, and time. No acute distress. Communicates appropriately.   Head: Normocephalic and atraumatic.    Eyes: Pupils equally round and react to light and accommodate. Extraocular movements intact.  Conjunctiva normal. Sclerae are normal.    Nose: Nasal passages clear. Mucosa moist.    Throat: Moist oral mucosa. No erythema or exudate of the pharynx. Clear oropharynx.    Neck: Supple. No cervical lymphadenopathy or supraclavicular nodes detected. Trachea midline   Heart: Regular rate and rhythm. S1 & S2 present. No S3 or S4. No rubs, gallops, or murmurs appreciated.  Radial and dorsalis pedis pulses +2/4 bilaterally.  Brisk capillary refill.    Lungs: Clear to auscultation bilaterally with no wheezes or rales. Equal chest excursion.  No conversational dyspnea. No respiratory distress noted.   Abdomen: Soft, nontender, nondistended belly. Bowel sounds are present in all four quadrants. No rigidity.  No guarding.  No ascites.   Extremities: No edema, cyanosis, or clubbing. Grossly moves all extremities.    Skin: Warm and dry without lesions. No ecchymosis noted.    Neurologic: Cranial nerves II through XII are grossly intact. Sensation to light touch is intact. Strength 5/5 in upper extremities and lower extremities bilaterally.  Genitourinary:  No urinary incontinence or Foley catheter   Psychiatric: Judgment and insight are intact. Mood and affect are appropriate for the situation.       Assessments:  Active Hospital Problems   (*Primary Problem)    Diagnosis    *Chest pain    Elevated troponin     Chronic    Nausea    Congestive heart failure (CMS HCC)    ESRD (end stage renal disease) (CMS HCC)     Chronic    Uncontrolled hypertension     Chronic       Plan:  Patient will be placed in observation for the above problems.  She will be treated with telemetry, cardiology consult, nephrology consult for dialysis, heparin for DVT prophylaxis, NTG paste, labs for  am.  Home medications will be restarted as appropriate.  Further orders will depend upon clinical course. The Hospitalist personally evaluated and examined the patient in conjunction with the MLP and agree with the assessments, treatment plan and disposition of the patient as recorded by the [facility].      Code status: Full Code  DVT prophylaxis: heparin   Diet: DIET CARDIAC (2G NA, LOWFAT, LOW CHOL) Do you want to initiate MNT Protocol? Yes    Disposition:  The patient is currently acutely ill requiring treatment on the medical floor for observation. Patient will be closely evaluated monitor and remove be adjusted accordingly.  Estimated length of stay less than 48 hr to obtain full medical treatment.      Evette Doffing, FNP-BC    [place] HOSPITALIST    I evaluated the patient independent of the APP and plan was discussed and agreed upon. I made adjustments to care as felt necessary. I spent more than 50% of encounter time with the patient and developing care.    Celene Kras, DO

## 2021-06-30 NOTE — Nurses Notes (Signed)
Patient tolerating 2L NC well, oxygen saturation has improved. Patient resting in bed, respirations are even and unlabored. Awakens to verbal stimuli.

## 2021-06-30 NOTE — ED Triage Notes (Addendum)
Ems reports pt c/o chest pain mid sternal, upper epigastric pain. Sx x 1 week, pt reports some shortness of breath. Last dialysis yest. Ems gave aspirin 324 mg and nitro x 1 without relief. 18 ga saline lock. + n/v

## 2021-06-30 NOTE — Consults (Signed)
[name] 46 y.o. female 429/B   Date of Service: 06/30/2021    Date of Admission:  06/30/2021   PCP: Cam Hai, FNP Code Status:Full Code       Reason for Consultation:  ESRD on HD  HPI:   Foot 58-year-old female with past medical history of end-stage renal disease on hemodialysis MWF who had dialysis on Saturday due to missing dialysis on Friday due to transportation presenting to the hospital with complaint of chest pain.  Patient has chronic chest pain on and off it is worse on non dialysis days could be related to her high blood pressure in the past.  Patient was sent to the hospital for further evaluation for her chest pain.  When I went to see the patient she was lying comfortably in bed.  Chest pain is minimal not reproducible.  Patient is due for dialysis tomorrow nephrology service was consulted.    Patient reports chronic nausea and vomiting sometimes      ROS:   Systematic review of 12 organ systems was negative except what mentioned in in the HPI.    ED medications:   Medications Administered in the ED   scopolamine 1 mg over 3 days transdermal patch (0 Patches Transdermal Not Given 06/30/21 0900)   nitroGLYCERIN (NITRO-BID) 2 % topical ointment (1 Inch Apply Topically Given 06/30/21 0846)   fentaNYL (SUBLIMAZE) 50 mcg/mL injection (50 mcg Intravenous Given 06/30/21 0715)   magnesium sulfate 1 G in D5W 100 mL premix IVPB (0 g Intravenous Stopped 06/30/21 0857)   dexamethasone (PF) 10 mg/mL injection (10 mg Intravenous Given 06/30/21 0727)   ondansetron (ZOFRAN) 2 mg/mL injection (4 mg Intravenous Given 06/30/21 0839)   nitroGLYCERIN (NITROSTAT) sublingual tablet (0.4 mg Sublingual Given 06/30/21 0839)   magnesium sulfate 1 G in D5W 100 mL premix IVPB (1 g Intravenous New Bag/New Syringe 06/30/21 1128)   prochlorperazine (COMPAZINE) 5 mg/mL injection (10 mg Intravenous Given 06/30/21 0846)   hydrALAZINE (APRESOLINE) injection 10 mg (10 mg  Intravenous Given 06/30/21 1126)       PMHx:    Past Medical History:   Diagnosis Date   . Asthma    . Chronic diastolic CHF (congestive heart failure) (CMS HCC)    . Esophageal reflux    . ESRD (end stage renal disease) (CMS [place])    . History of anemia due to CKD    . Hypertension    . MI (myocardial infarction) (CMS [place])    . Mitral valve regurgitation    . Pulmonary edema    . Sleep apnea    . Tricuspid valve regurgitation         PSHx:   Past Surgical History:   Procedure Laterality Date   . ESOPHAGOGASTRODUODENOSCOPY     . HX BACK SURGERY     . HX CHOLECYSTECTOMY     . HX HYSTERECTOMY     . HX TONSILLECTOMY            Allergies:    Allergies   Allergen Reactions   . Lisinopril Swelling    Social History  Social History     Tobacco Use   . Smoking status: Every Day     Packs/day: 0.50     Types: Cigarettes   . Smokeless tobacco: Never   Vaping Use   . Vaping Use: Never used   Substance Use Topics   .  Alcohol use: Not Currently   . Drug use: Yes     Types: Marijuana       Family History  Family Medical History:     Problem Relation (Age of Onset)    Breast Cancer Mother    Coronary Artery Disease Mother    Diabetes type II Mother    Hypertension (High Blood Pressure) Father             Home Meds:      Prior to Admission medications    Medication Sig Start Date End Date Taking? Authorizing Provider   albuterol sulfate (PROVENTIL OR VENTOLIN OR PROAIR) 90 mcg/actuation Inhalation oral inhaler Take 1-2 Puffs by inhalation Every 6 hours as needed    Provider, Historical   aspirin (ECOTRIN) 81 mg Oral Tablet, Delayed Release (E.C.) Take 1 Tablet (81 mg total) by mouth Once a day    Provider, Historical   carvediloL (COREG) 25 mg Oral Tablet Take 1 Tablet (25 mg total) by mouth Twice daily for 30 days 12/13/20 01/12/21  Buena Irish, MD   cloNIDine HCL (CATAPRES) 0.1 mg Oral Tablet Take 1 Tablet (0.1 mg total) by mouth in the morning and 1 Tablet (0.1 mg total) at noon and 1 Tablet (0.1 mg total) before  bedtime.    Provider, Historical   clopidogreL (PLAVIX) 75 mg Oral Tablet Take 1 Tablet (75 mg total) by mouth Once a day for 30 days 12/14/20 01/13/21  Murugu-Knopf, Lars Pinks, MD   dilTIAZem HCL (CARDIZEM) 120 mg Oral Tablet Take 1 Tablet (120 mg total) by mouth in the morning and 1 Tablet (120 mg total) at noon and 1 Tablet (120 mg total) before bedtime.    Provider, Historical   doxazosin (CARDURA) 4 mg Oral Tablet Take 1 Tablet (4 mg total) by mouth Every evening    Provider, Historical   furosemide (LASIX) 80 mg Oral Tablet Take 1 Tablet (80 mg total) by mouth in the morning and 1 Tablet (80 mg total) in the evening.    Provider, Historical   hydrALAZINE (APRESOLINE) 100 mg Oral Tablet Take 1 Tablet (100 mg total) by mouth Three times a day for 30 days 12/13/20 01/12/21  Buena Irish, MD   hydrOXYzine pamoate (VISTARIL) 25 mg Oral Capsule Take 1 Capsule (25 mg total) by mouth Every night as needed for Itching    Provider, Historical   isosorbide mononitrate (IMDUR) 60 mg Oral Tablet Sustained Release 24 hr Take 1 Tablet (60 mg total) by mouth Every morning    Provider, Historical   labetaloL (NORMODYNE) 200 mg Oral Tablet Take 1 Tablet (200 mg total) by mouth in the morning and 1 Tablet (200 mg total) at noon and 1 Tablet (200 mg total) before bedtime. 1.5 tabs.    Provider, Historical   lactulose (ENULOSE) 10 gram/15 mL Oral Solution Take 30 mL by mouth Once a day for 3 days 06/04/21 06/07/21  Regina Eck, DO   loratadine (CLARITIN) 10 mg Oral Tablet Take 1 Tablet (10 mg total) by mouth Once a day    Provider, Historical   losartan (COZAAR) 50 mg Oral Tablet Take 1 Tablet (50 mg total) by mouth Once a day    Provider, Historical   montelukast (SINGULAIR) 10 mg Oral Tablet Take 1 Tablet (10 mg total) by mouth Every evening    Provider, Historical   nitroGLYCERIN (NITROSTAT) 0.4 mg Sublingual Tablet, Sublingual Place 1 Tablet (0.4 mg total) under the tongue Every 5 minutes for 3 doses for 3  doses  over 15 minutes 06/23/21 06/24/21  Elder, Gildardo Cranker, MD   oxyCODONE-acetaminophen (PERCOCET) 5-325 mg Oral Tablet Take 1 Tablet by mouth Every 4 hours as needed 12/13/20   Murugu-Knopf, Roselyne, MD   pantoprazole (PROTONIX) 40 mg Oral Tablet, Delayed Release (E.C.) Take 1 Tablet (40 mg total) by mouth Once a day    Provider, Historical   polyethylene glycol (MIRALAX) 17 gram Oral Powder in Packet Take 1 Packet (17 g total) by mouth Once a day 12/14/20   Buena Irish, MD   sennosides-docusate sodium (SENOKOT-S) 8.6-50 mg Oral Tablet Take 1 Tablet by mouth Twice daily 12/13/20   Murugu-Knopf, Roselyne, MD   spironolactone (ALDACTONE) 25 mg Oral Tablet Take 1 Tablet (25 mg total) by mouth Once a day    Provider, Historical          Current medications   heparin 5,000 unit/mL injection, 5,000 Units, Subcutaneous, Q8HRS  nitroGLYCERIN (NITRO-BID) 2 % topical ointment, 1 Inch, Apply Topically, 2x/day  NS flush syringe, 3 mL, Intracatheter, Q8HRS  NS flush syringe, 3 mL, Intracatheter, Q1H PRN  ondansetron (ZOFRAN) 2 mg/mL injection, 4 mg, Intravenous, Q8H PRN  scopolamine 1 mg over 3 days transdermal patch, 1 Patch, Transdermal, Q72H            Physical:  Filed Vitals:    06/30/21 1130 06/30/21 1200 06/30/21 1230 06/30/21 1400   BP: (!) 170/113 (!) 155/102 (!) 153/110 (!) 173/115   Pulse: 78 79 77 86   Resp: 18 16 16 18    Temp:    36.8 C (98.2 F)   SpO2: 98% 95% 94% 93%        Intake/Output Summary (Last 24 hours) at 06/30/2021 1713  Last data filed at 06/30/2021 0857  Gross per 24 hour   Intake 200 ml   Output --   Net 200 ml        Patient is resting comfortably.  NAD   HEENT normocephalic atraumatic.  Eye exam normal inspection.    Mucous membranes moist, no jaundice.  Neck exam no JVD normal inspection.  Cardiovascular system: Regular rate and rhythm no murmurs rubs or gallops. No chest wall tenderness  Lungs: Clear to auscultation bilaterally no wheezing no rhonchi.  Abdomen soft nontender  nondistended.  Extremities no clubbing cyanosis or edema  -right upper chest PermCath      Labs:  CBC:     11.9* (05/07 0720) \   9.4* (05/07 0720) /   331 (05/07 0720)      / 29.0* (05/07 0720) \          BMP:   131* (05/07 0720) 90* (05/07 0720) 26* (05/07 0720)    /     105 (05/07 0720)   4.2 (05/07 0720) 25 (05/07 0720) 8.78* (05/07 0720) \                 Diagnostic studies:  Imaging:   ECG 12 LEAD  Sinus rhythm with 1st degree AV block  Moderate voltage criteria for LVH, may be normal variant ( Sokolow-Lyon , Cornell product )  Abnormal QRS-T angle, consider primary T wave abnormality  Abnormal ECG  When compared with ECG of 27-Jun-2021 06:39,  No significant change was found  Confirmed by Rana, Javed ([id number]) on 06/30/2021 12:59:40 PM  XR ABD FLAT AND UPRIGHT SERIES (W PA CHEST)  Narrative: Teagan Whetsell    RADIOLOGIST: James Patrizi    XR ABD FLAT AND UPRIGHT SERIES (W PA CHEST) performed  on 06/30/2021 7:52 AM    CLINICAL HISTORY: chest, abdo pain, vomiting. eval for boorhaves.  chest, abd pain, vomiting, eval for boorhaves.     TECHNIQUE: Single view chest with supine and upright views of the abdomen.    COMPARISON:  06/27/2021    FINDINGS:  The heart size is normal.   There are chronic-appearing changes of both lungs. There is no evidence of pleural effusion or pneumothorax. There is no evidence of pneumomediastinum.     Bowel gas pattern is normal.  No evidence of bowel obstruction.  No free air.    Atherosclerotic vascular calcifications identified.    There are degenerative changes of the spine.  Impression: NO EVIDENCE OF PNEUMOMEDIASTINUM OR PNEUMOTHORAX. IF THERE IS CLINICAL CONCERN FOR BOERHAAVE SYNDROME CT SCAN OF THE THORAX COULD BE PERFORMED.    Radiologist location ID: [id number]      Assessments:  Active Hospital Problems   (*Primary Problem)    Diagnosis   . *Chest pain   . Elevated troponin     Chronic   . Nausea   . Congestive heart failure (CMS HCC)   . ESRD (end stage renal disease) (CMS HCC)      Chronic   . Uncontrolled hypertension     Chronic       Plan:           End-stage renal disease on hemodialysis  -Continue dialysis 3 times a week on MWF  -please refer to dialysis orders    Anemia  -Monitor  -Epogen     CKD MBD/secondary hyperparathyroidism   -Phosphorus binders  -sensipar  -binders for Phos > 5.5  -aim for Phos level 3.5-5.5  -Renal diet    Acid-base  -Stable  -Continue dialysis    Electrolytes  -Monitor  -See dialysis orders  -Replace as needed    Volume status  -Fluid restriction  -Dialysis    Diet  -Renal diet      HTN  Home meds   HD               Beather Arbour, MD, FASN, 06/30/2021, 17:13

## 2021-06-30 NOTE — ED Attending Handoff Note (Signed)
[facility], [facility] Emergency Department  Emergency Department  Course Note    Care/report received from Dr. Nelson Chimes, Edwena Blow, MD at  08:28.  Per report:  [name] is a 46 y.o. female who had concerns including Chest Pain .     Pending labs/imaging/consults:  Patient is still complaining chest pain midsternal radiating to her back along with nausea.  Patient was given dexamethasone prior to coming in without any relief for the nausea.  Patient now has been given Zofran.  Patient will be given nitroglycerin as well.  She has not received any nitro as well.  Plan:  Plan is to give the patient nitroglycerin paste as well as sublingual and get the patient admitted.  Her 1st enzyme was 69.  I ordered 2nd and 3rd enzymes to be done.    Course:  I spoke with Dr. Georgina Snell white, hospitalist, at [facility] who accepted the patient for transfer and admission as long as there is a bed available.     Disposition; transfer for admission to [facility]    Clinical Impression   Nausea & vomiting (Primary)   Chest pain, unspecified type   Abdominal pain, unspecified abdominal location   Prolonged Q-T interval on ECG         Ammie Ferrier, MD  06/30/2021, 08:28

## 2021-06-30 NOTE — Nurses Notes (Signed)
Tiffany from [facility] here to transport patient to [facility] at this time. Report called and all questions/concerns answered.

## 2021-06-30 NOTE — ED Provider Notes (Signed)
[facility], [facility] Emergency Department  ED Primary Provider Note  HPI:  [name] is a 46 y.o. female     Events with nausea vomiting chest pain belly pain which in ongoing for the last few hours.  CC feels extremely nauseated and is unable to give significant history.  This woke her out of her sleep.  She feels like she is burning up.  States something not right.  Patient has ESRD TTS.  She did complete her dialysis session yesterday.  I did review nephrology note 5/1 where she had an ED visit for chest pain.  She had elevated troponins which are chronic.  Patient at that time was discharged to dialysis.  Patient states she is COVID vaccinated.  Full code.    ROS review and negative aside from stated in HPI.    Physical Exam:  ED Triage Vitals [06/30/21 0658]   BP (Non-Invasive) (!) 216/145   Heart Rate 86   Respiratory Rate 20   Temperature 37 C (98.6 F)   SpO2 100 %   Weight 81.2 kg (179 lb)   Height 1.702 m (5' 7")     Patient is diaphoretic and appears to be in distress..  Patient awake alert oriented x3.  Anxious mood.  Appears nauseated.   Pupils 3 mm equal round reactive.  Extraocular movements are intact.  Oropharynx is clear.  Mucous membranes moist.  Trachea midline.  Neck is supple.  Heart has regular rate and rhythm without significant murmurs rubs or gallops.  Lungs are clear to auscultation.  Abdomen soft nontender, nondistended.  Moving all extremities without difficulty.  No rash no edema.  right subclavian dialysis catheter site is clean and dry.      Patient data:  Labs Ordered/Reviewed   COMPREHENSIVE METABOLIC PANEL, NON-FASTING - Abnormal; Notable for the following components:       Result Value    SODIUM 131 (*)     CHLORIDE 90 (*)     BUN 26 (*)     CREATININE 8.78 (*)     ESTIMATED GFR 5 (*)     CALCIUM 11.0 (*)     PROTEIN TOTAL 8.9 (*)     ALBUMIN/GLOBULIN RATIO 0.7 (*)     OSMOLALITY, CALCULATED 268 (*)     All other components within normal  limits    Narrative:     Estimated Glomerular Filtration Rate (eGFR) is calculated using the CKD-EPI (2021) equation, intended for patients 12 years of age and older. If gender is not documented or "unknown", there will be no eGFR calculation.   CBC WITH DIFF - Abnormal; Notable for the following components:    WBC 11.9 (*)     RBC 3.07 (*)     HGB 9.4 (*)     HCT 29.0 (*)     RDW 19.2 (*)     NEUTROPHIL % 85 (*)     LYMPHOCYTE % 9 (*)     NEUTROPHIL # 10.14 (*)     All other components within normal limits   TROPONIN-I - Abnormal; Notable for the following components:    TROPONIN I 69 (*)     All other components within normal limits    Narrative:     Values received on females ranging between 12-15 ng/L MUST include the next serial troponin to review changes in the delta differences as the reference range for the Access II chemistry analyzer is lower than the established reference range.  LIPASE - Normal   LACTIC ACID LEVEL W/ REFLEX FOR LEVEL >2.0 - Normal   CBC/DIFF    Narrative:     The following orders were created for panel order CBC/DIFF.  Procedure                               Abnormality         Status                     ---------                               -----------         ------                     CBC WITH DIFF[[id number]]                Abnormal            Final result                 Please view results for these tests on the individual orders.   TROPONIN-I   TROPONIN-I     No orders to display     EKG shows sinus rhythm with a rate of 80, normal axis, QTC is 511.  I do not appreciate significant ST or T-wave changes.  No change compared to prior EKG.    MDM:    Chest pain belly pain vomiting.  DDX includes electrolyte disturbance, [place], ACS, central nausea secondary to fluid shift, malignant arrhythmia, dehydration, urinary tract infection., pancreatitis hepatobiliary disease amongst other pathology.  Patient is hypertensive to 216/145.  Maintaining saturations on room air.  She  appears acutely ill actively vomiting.  EKG shows a prolonged QTC of 511.  Patient was given 2 g of Mag and.  We do not have a scopolamine here in the department.  As alternative patient was given dexamethasone.  Just received fentanyl for his pain.  Lab work was initiated as well as x-rays of the abdomen and chest.  These are all pending.  Case signed out to oncoming provider with suggest in to re-evaluate antiemetics once magnesium has been completed.        Data Unavailable  Clinical Impression   Nausea & vomiting (Primary)   Chest pain, unspecified type   Abdominal pain, unspecified abdominal location   Prolonged Q-T interval on ECG     Medications Administered in the ED   scopolamine 1 mg over 3 days transdermal patch (has no administration in time range)   magnesium sulfate 1 G in D5W 100 mL premix IVPB (1 g Intravenous New Bag/New Syringe 06/30/21 0717)   fentaNYL (SUBLIMAZE) 50 mcg/mL injection (50 mcg Intravenous Given 06/30/21 0715)   dexamethasone (PF) 10 mg/mL injection (10 mg Intravenous Given 06/30/21 0727)        Current Discharge Medication List      CONTINUE these medications - NO CHANGES were made during your visit.      Details   albuterol sulfate 90 mcg/actuation oral inhaler  Commonly known as: PROVENTIL or VENTOLIN or PROAIR   1-2 Puffs, Inhalation, EVERY 6 HOURS PRN  Refills: 0     aspirin 81 mg Tablet, Delayed Release (E.C.)  Commonly known as: ECOTRIN   81 mg, Oral, DAILY  Refills: 0  carvediloL 25 mg Tablet  Commonly known as: COREG   25 mg, Oral, 2 TIMES DAILY  Qty: 60 Tablet  Refills: 0     cloNIDine HCL 0.1 mg Tablet  Commonly known as: CATAPRES   0.1 mg, Oral, 3 TIMES DAILY  Refills: 0     clopidogreL 75 mg Tablet  Commonly known as: PLAVIX   75 mg, Oral, DAILY  Qty: 30 Tablet  Refills: 0     dilTIAZem HCL 120 mg Tablet  Commonly known as: CARDIZEM   120 mg, Oral, 3 TIMES DAILY  Refills: 0     doxazosin 4 mg Tablet  Commonly known as: CARDURA   4 mg, Oral, EVERY EVENING  Refills: 0      furosemide 80 mg Tablet  Commonly known as: LASIX   80 mg, Oral, 2 TIMES DAILY  Refills: 0     hydrALAZINE 100 mg Tablet  Commonly known as: APRESOLINE   100 mg, Oral, 3 TIMES DAILY  Qty: 90 Tablet  Refills: 0     hydrOXYzine pamoate 25 mg Capsule  Commonly known as: VISTARIL   25 mg, Oral, NIGHTLY PRN  Refills: 0     isosorbide mononitrate 60 mg Tablet Sustained Release 24 hr  Commonly known as: IMDUR   60 mg, Oral, EVERY MORNING  Refills: 0     labetaloL 200 mg Tablet  Commonly known as: NORMODYNE   200 mg, Oral, 3 TIMES DAILY, 1.5 tabs  Refills: 0     loratadine 10 mg Tablet  Commonly known as: CLARITIN   10 mg, Oral, DAILY  Refills: 0     losartan 50 mg Tablet  Commonly known as: COZAAR   50 mg, Oral, DAILY  Refills: 0     montelukast 10 mg Tablet  Commonly known as: SINGULAIR   10 mg, Oral, EVERY EVENING  Refills: 0     nitroGLYCERIN 0.4 mg Tablet, Sublingual  Commonly known as: NITROSTAT   0.4 mg, Sublingual, EVERY 5 MIN, for 3 doses over 15 minutes  Qty: 3 Tablet  Refills: 0     oxyCODONE-acetaminophen 5-325 mg Tablet  Commonly known as: PERCOCET   1 Tablet, Oral, EVERY 4 HOURS PRN  Qty: 12 Tablet  Refills: 0     pantoprazole 40 mg Tablet, Delayed Release (E.C.)  Commonly known as: PROTONIX   40 mg, Oral, DAILY  Refills: 0     polyethylene glycol 17 gram Powder in Packet  Commonly known as: MIRALAX   17 g, Oral, DAILY  Qty: 7 Packet  Refills: 0     sennosides-docusate sodium 8.6-50 mg Tablet  Commonly known as: SENOKOT-S   1 Tablet, Oral, 2 TIMES DAILY  Qty: 20 Tablet  Refills: 0     spironolactone 25 mg Tablet  Commonly known as: ALDACTONE   25 mg, Oral, DAILY  Refills: 0        ASK your doctor about these medications.      Details   lactulose 10 gram/15 mL Solution  Commonly known as: ENULOSE  Ask about: Should I take this medication?   30 mL, Oral, DAILY  Qty: 90 mL  Refills: 0

## 2021-07-01 DIAGNOSIS — I119 Hypertensive heart disease without heart failure: Secondary | ICD-10-CM | POA: Diagnosis present

## 2021-07-01 DIAGNOSIS — I350 Nonrheumatic aortic (valve) stenosis: Secondary | ICD-10-CM

## 2021-07-01 DIAGNOSIS — I2510 Atherosclerotic heart disease of native coronary artery without angina pectoris: Secondary | ICD-10-CM

## 2021-07-01 DIAGNOSIS — R110 Nausea: Secondary | ICD-10-CM

## 2021-07-01 DIAGNOSIS — Z91199 Patient's noncompliance with other medical treatment and regimen due to unspecified reason: Secondary | ICD-10-CM

## 2021-07-01 DIAGNOSIS — I517 Cardiomegaly: Secondary | ICD-10-CM | POA: Diagnosis present

## 2021-07-01 LAB — CBC WITH DIFF
BASOPHIL #: 0 10*3/uL (ref 0.00–0.30)
BASOPHIL %: 1 % (ref 0–3)
EOSINOPHIL #: 0 10*3/uL (ref 0.00–0.80)
EOSINOPHIL %: 0 % (ref 0–7)
HCT: 26.7 % — ABNORMAL LOW (ref 37.0–47.0)
HGB: 9 g/dL — ABNORMAL LOW (ref 12.5–16.0)
LYMPHOCYTE #: 0.6 10*3/uL — ABNORMAL LOW (ref 1.10–5.00)
LYMPHOCYTE %: 6 % — ABNORMAL LOW (ref 25–45)
MCH: 30.8 pg (ref 27.0–32.0)
MCHC: 33.6 g/dL (ref 32.0–36.0)
MCV: 91.4 fL (ref 78.0–99.0)
MONOCYTE #: 0.7 10*3/uL (ref 0.00–1.30)
MONOCYTE %: 8 % (ref 0–12)
MPV: 6.9 fL — ABNORMAL LOW (ref 7.4–10.4)
NEUTROPHIL #: 7.6 10*3/uL (ref 1.80–8.40)
NEUTROPHIL %: 85 % — ABNORMAL HIGH (ref 40–76)
PLATELETS: 320 10*3/uL (ref 140–440)
RBC: 2.92 10*6/uL — ABNORMAL LOW (ref 4.20–5.40)
RDW: 18.7 % — ABNORMAL HIGH (ref 11.6–14.8)
WBC: 8.9 10*3/uL (ref 4.0–10.5)
WBCS UNCORRECTED: 8.9 10*3/uL

## 2021-07-01 LAB — COMPREHENSIVE METABOLIC PANEL, NON-FASTING
ALBUMIN/GLOBULIN RATIO: 1.1 (ref 0.8–1.4)
ALBUMIN: 4.1 g/dL (ref 3.5–5.7)
ALKALINE PHOSPHATASE: 66 U/L (ref 34–104)
ALT (SGPT): <7 U/L — ABNORMAL LOW (ref 7–52)
ANION GAP: 15 mmol/L (ref 10–20)
AST (SGOT): 12 U/L — ABNORMAL LOW (ref 13–39)
BILIRUBIN TOTAL: 0.5 mg/dL (ref 0.3–1.2)
BUN/CREA RATIO: 4 — ABNORMAL LOW (ref 6–22)
BUN: 43 mg/dL — ABNORMAL HIGH (ref 7–25)
CALCIUM, CORRECTED: 10.4 mg/dL (ref 8.9–10.8)
CALCIUM: 10.5 mg/dL — ABNORMAL HIGH (ref 8.6–10.3)
CHLORIDE: 90 mmol/L — ABNORMAL LOW (ref 98–107)
CO2 TOTAL: 26 mmol/L (ref 21–31)
CREATININE: 10.07 mg/dL — ABNORMAL HIGH (ref 0.60–1.30)
ESTIMATED GFR: 4 mL/min/{1.73_m2} — ABNORMAL LOW (ref >59)
GLOBULIN: 3.7 (ref 2.9–5.4)
GLUCOSE: 115 mg/dL — ABNORMAL HIGH (ref 74–109)
OSMOLALITY, CALCULATED: 275 mOsm/kg (ref 270–290)
POTASSIUM: 4.6 mmol/L (ref 3.5–5.1)
PROTEIN TOTAL: 7.8 g/dL (ref 6.4–8.9)
SODIUM: 131 mmol/L — ABNORMAL LOW (ref 136–145)

## 2021-07-01 LAB — PARATHYROID HORMONE (PTH): PTH: 453 pg/mL — ABNORMAL HIGH (ref 12.0–88.0)

## 2021-07-01 LAB — PHOSPHORUS: PHOSPHORUS: 7 mg/dL (ref 3.7–7.2)

## 2021-07-01 MED ORDER — ACETAMINOPHEN 325 MG TABLET
650.0000 mg | ORAL_TABLET | ORAL | Status: DC | PRN
Start: 2021-07-01 — End: 2021-07-01
  Administered 2021-07-01: 650 mg via ORAL
  Filled 2021-07-01 (×2): qty 2

## 2021-07-01 NOTE — Care Management Notes (Signed)
[place] Management Initial Evaluation    Patient Name: [name]  Date of Birth: November 16, 1975  Sex: female  Date/Time of Admission: 06/30/2021  6:57 AM  Room/Bed: 429/B  Payor: [employer] MEDICARE / Plan: [employer] CHOICE PPO / Product Type: PPO /   PCP: Cam Hai, FNP    Pharmacy Info:   Preferred [address], [place]    [place] [address]    Phone: [phone] Fax: [phone]    Hours: Not open 24 hours          Emergency Contact Info:   Extended Emergency Contact Information  Primary Emergency Contact: Garner Nash  Mobile Phone: [phone]  Relation: Sister    History:   [name] is a 48 y.o., female, admitted 06-27-21    Height/Weight: 170.2 cm (5' 7.01") / 81.2 kg (179 lb)     LOS: 0 days   Admitting Diagnosis: Elevated troponin [R77.8]    Assessment:      07/01/21 1240   Assessment Details   Assessment Type Admission   Insurance Information/Type   Insurance type Medicare   Living Environment   Able to Return to Prior Arrangements yes   Care Management Plan   Discharge Planning Status initial meeting   Projected Discharge Date 07/01/21   Discharge plan discussed with: Patient   Discharge Needs Assessment   Discharge Facility/Level of Care Needs Home (Patient/Family Member/other)(code 1)   Referral Information   Admission Type observation     CM initial discharge planning assessment. Pt is an alert 46 yo female that is admitted to observation with Chest pain. Pt lives at home alone. Pt states she has moved to the [facility]. CM advised Pt she might want to make sure [facility] has her new address in order to arranges future transports. Pt indicated that has been done. When asked if there are any barriers in the home that will interfere with or impede care, Pt state no. Pt's discharge goals: wishes to retun home when discharged.     Discharge Plan:  Home (Patient/Family  Member/other) (code 1)      The patient will continue to be evaluated for developing discharge needs.     Case Manager: Lianne Moris, SOCIAL WORKER 07/01/2021, 12:42  Phone: [phone]

## 2021-07-01 NOTE — Ancillary Notes (Signed)
Referral for MI education.   Pt admitted with CP on 06/30/21.  Has hx of CHF, HTN, MI, ESRD-HD.   Pt stated that she had 23 heart attacks in the past.    Elevated Troponins.   Gave and reviewed MI information packet with the pt.   MI Education completed.  See education section.

## 2021-07-01 NOTE — Care Plan (Signed)
Problem: Adult Inpatient Plan of Care  Goal: Plan of Care Review  Outcome: Ongoing (see interventions/notes)  Goal: Patient-Specific Goal (Individualized)  Outcome: Ongoing (see interventions/notes)  Flowsheets (Taken 06/30/2021 2000)  Individualized Care Needs: chest pain relief  Anxieties, Fears or Concerns: being hospitalized too long  Patient-Specific Goals (Include Timeframe): d/c soon  Plan of Care Reviewed With: patient  Goal: Absence of Hospital-Acquired Illness or Injury  Outcome: Ongoing (see interventions/notes)  Goal: Optimal Comfort and Wellbeing  Outcome: Ongoing (see interventions/notes)  Goal: Rounds/Family Conference  Outcome: Ongoing (see interventions/notes)     Problem: Chest Pain  Goal: Resolution of Chest Pain Symptoms  Outcome: Ongoing (see interventions/notes)     Problem: Pain Acute  Goal: Optimal Pain Control and Function  Outcome: Ongoing (see interventions/notes)     Problem: Fall Injury Risk  Goal: Absence of Fall and Fall-Related Injury  Outcome: Ongoing (see interventions/notes)

## 2021-07-01 NOTE — Consults (Signed)
[place]    Date of Service:  07/01/2021  [name]   46 y.o. female  Date of Admission:  06/30/2021  Date of Birth:  [birth year]  0     Reason for Consultation:  Chest pain elevated troponin I        History of Present Illness:  [name] is a 46 y.o. 13 American female who presents with Chest pain.  Patient has been noncompliant in the past.  She has a known history of moderate two-vessel coronary artery disease with a cardiac catheterization 2017 and then 2019.  Since then she had recurrent hospitalizations for chest pain and very high blood pressure.  She often does not take medications as she gets sick and she cannot keep the blood pressure medication down.  This time same thing happened.  She said she was claustrophobic.  She missed her dialysis on Friday and she went for dialysis on Saturday.  She was having nausea, vomiting and complaining of diffuse chest pain belly pain and leg pains.  She was drowsy.  She said Monday but told me drenched in sweating.  Upon admission her troponin I elevated but chronically elevated without any rise or fall.  They were flat.  EKG no acute changes.  No ventricle arrhythmia.  Currently she denies chest pain.  Complain shortness of her nausea.  She is on dialysis now.      History:    Past Medical:    Past Medical History:   Diagnosis Date   . Asthma    . Chronic diastolic CHF (congestive heart failure) (CMS HCC)    . Esophageal reflux    . ESRD (end stage renal disease) (CMS [place])    . History of anemia due to CKD    . Hypertension    . MI (myocardial infarction) (CMS [place])    . Mitral valve regurgitation    . Pulmonary edema    . Sleep apnea    . Tricuspid valve regurgitation       Past Surgical:    Past Surgical History:   Procedure Laterality Date   . ESOPHAGOGASTRODUODENOSCOPY     . HX BACK SURGERY     . HX CHOLECYSTECTOMY     . HX HYSTERECTOMY     . HX TONSILLECTOMY        Family:    Family Medical History:     Problem  Relation (Age of Onset)    Breast Cancer Mother    Coronary Artery Disease Mother    Diabetes type II Mother    Hypertension (High Blood Pressure) Father         Social:   reports that she has been smoking cigarettes. She has been smoking an average of .5 packs per day. She has never used smokeless tobacco. She reports that she does not currently use alcohol. She reports current drug use. Drug: Marijuana.     REVIEW OF SYSTEMS:  All systems have been reviewed and found to be negative except for as stated above in the history of present illness.     ROS       Allergies   Allergen Reactions   . Lisinopril Swelling       Medications:  Medications Prior to Admission     Prescriptions    albuterol sulfate (PROVENTIL OR VENTOLIN OR PROAIR) 90 mcg/actuation Inhalation oral inhaler    Take 1-2 Puffs by inhalation Every 6 hours as needed    aspirin (ECOTRIN) 81  mg Oral Tablet, Delayed Release (E.C.)    Take 1 Tablet (81 mg total) by mouth Once a day    carvediloL (COREG) 25 mg Oral Tablet    Take 1 Tablet (25 mg total) by mouth Twice daily for 30 days    cloNIDine HCL (CATAPRES) 0.1 mg Oral Tablet    Take 1 Tablet (0.1 mg total) by mouth in the morning and 1 Tablet (0.1 mg total) at noon and 1 Tablet (0.1 mg total) before bedtime.    clopidogreL (PLAVIX) 75 mg Oral Tablet    Take 1 Tablet (75 mg total) by mouth Once a day for 30 days    dilTIAZem HCL (CARDIZEM) 120 mg Oral Tablet    Take 1 Tablet (120 mg total) by mouth in the morning and 1 Tablet (120 mg total) at noon and 1 Tablet (120 mg total) before bedtime.    doxazosin (CARDURA) 4 mg Oral Tablet    Take 1 Tablet (4 mg total) by mouth Every evening    furosemide (LASIX) 80 mg Oral Tablet    Take 1 Tablet (80 mg total) by mouth in the morning and 1 Tablet (80 mg total) in the evening.    hydrALAZINE (APRESOLINE) 100 mg Oral Tablet    Take 1 Tablet (100 mg total) by mouth Three times a day for 30 days    hydrOXYzine pamoate (VISTARIL) 25 mg Oral Capsule    Take 1 Capsule  (25 mg total) by mouth Every night as needed for Itching    isosorbide mononitrate (IMDUR) 60 mg Oral Tablet Sustained Release 24 hr    Take 1 Tablet (60 mg total) by mouth Every morning    labetaloL (NORMODYNE) 200 mg Oral Tablet    Take 1 Tablet (200 mg total) by mouth in the morning and 1 Tablet (200 mg total) at noon and 1 Tablet (200 mg total) before bedtime. 1.5 tabs.    lactulose (ENULOSE) 10 gram/15 mL Oral Solution    Take 30 mL by mouth Once a day for 3 days    loratadine (CLARITIN) 10 mg Oral Tablet    Take 1 Tablet (10 mg total) by mouth Once a day    losartan (COZAAR) 50 mg Oral Tablet    Take 1 Tablet (50 mg total) by mouth Once a day    montelukast (SINGULAIR) 10 mg Oral Tablet    Take 1 Tablet (10 mg total) by mouth Every evening    nitroGLYCERIN (NITROSTAT) 0.4 mg Sublingual Tablet, Sublingual    Place 1 Tablet (0.4 mg total) under the tongue Every 5 minutes for 3 doses for 3 doses over 15 minutes    oxyCODONE-acetaminophen (PERCOCET) 5-325 mg Oral Tablet    Take 1 Tablet by mouth Every 4 hours as needed    pantoprazole (PROTONIX) 40 mg Oral Tablet, Delayed Release (E.C.)    Take 1 Tablet (40 mg total) by mouth Once a day    polyethylene glycol (MIRALAX) 17 gram Oral Powder in Packet    Take 1 Packet (17 g total) by mouth Once a day    sennosides-docusate sodium (SENOKOT-S) 8.6-50 mg Oral Tablet    Take 1 Tablet by mouth Twice daily    spironolactone (ALDACTONE) 25 mg Oral Tablet    Take 1 Tablet (25 mg total) by mouth Once a day        acetaminophen (TYLENOL) tablet, 650 mg, Oral, Q4H PRN  cinacalcet (SENSIPAR) tablet, 60 mg, Oral, Daily with Breakfast  cloNIDine (CATAPRES-TTS) transdermal  patch 0.3 mg, 0.3 mg, Transdermal, Q7 Days  heparin 5,000 unit/mL injection, 5,000 Units, Subcutaneous, Q8HRS  labetalol (NORMODYNE) tablet, 200 mg, Oral, Q12H  losartan (COZAAR) tablet, 100 mg, Oral, Daily  midodrine (PROAMITINE) tablet, 5 mg, Oral, DIALYSIS DAILY PRN  nitroGLYCERIN (NITRO-BID) 2 % topical  ointment, 1 Inch, Apply Topically, 2x/day  NS flush syringe, 3 mL, Intracatheter, Q8HRS  NS flush syringe, 3 mL, Intracatheter, Q1H PRN  ondansetron (ZOFRAN) 2 mg/mL injection, 4 mg, Intravenous, Q8H PRN  scopolamine 1 mg over 3 days transdermal patch, 1 Patch, Transdermal, Q72H  sevelamer carbonate (RENVELA) tablet, 2,400 mg, Oral, 3x/day-Meals  spironolactone (ALDACTONE) tablet, 50 mg, Oral, 2x/day-Food        Physical Exam:  Physical Exam  Constitutional:       General: She is not in acute distress.     Appearance: Normal appearance. She is not toxic-appearing or diaphoretic.   HENT:      Head: Normocephalic.      Mouth/Throat:      Mouth: Mucous membranes are moist.   Neck:      Vascular: No carotid bruit.   Cardiovascular:      Rate and Rhythm: Normal rate and regular rhythm.      Pulses: Normal pulses.      Heart sounds: Murmur heard.     No friction rub. No gallop.      Comments: Ejection systolic murmur grade 2/4 to the aortic area goes to the carotids.  Jugular venous pressure is not elevated  Pulmonary:      Effort: Pulmonary effort is normal.      Breath sounds: Normal breath sounds.   Abdominal:      General: Abdomen is flat. Bowel sounds are normal. There is no distension.      Palpations: Abdomen is soft.      Tenderness: There is abdominal tenderness.   Musculoskeletal:         General: No swelling.      Cervical back: Neck supple. No rigidity.      Right lower leg: No edema.      Left lower leg: No edema.   Lymphadenopathy:      Cervical: No cervical adenopathy.   Skin:     General: Skin is warm and dry.      Coloration: Skin is not jaundiced.      Findings: No bruising.   Neurological:      Mental Status: She is alert and oriented to person, place, and time.   Psychiatric:         Mood and Affect: Mood normal.          Vitals:  Temperature: 36.9 C (98.4 F)  Heart Rate: 65  Respiratory Rate: 16  BP (Non-Invasive): 129/85  SpO2: 97 %    vitals reviewed.     BMP:      Basic Metabolic Profile    Lab  Results   Component Value Date/Time    SODIUM 131 (L) 07/01/2021 03:37 AM    POTASSIUM 4.6 07/01/2021 03:37 AM    CHLORIDE 90 (L) 07/01/2021 03:37 AM    CO2 26 07/01/2021 03:37 AM    ANIONGAP 15 07/01/2021 03:37 AM    Lab Results   Component Value Date/Time    BUN 43 (H) 07/01/2021 03:37 AM    CREATININE 10.07 (H) 07/01/2021 03:37 AM    GLUCOSENF 115 (H) 07/01/2021 03:37 AM          CMP: @LASTCMP @   CBC: COMPLETE  BLOOD COUNT   Lab Results   Component Value Date    WBC 8.9 07/01/2021    HGB 9.0 (L) 07/01/2021    HCT 26.7 (L) 07/01/2021    PLTCNT 320 07/01/2021       DIFFERENTIAL  Lab Results   Component Value Date    PMNS 85 (H) 07/01/2021    LYMPHOCYTES 6 (L) 07/01/2021    MONOCYTES 8 07/01/2021    EOSINOPHIL 0 07/01/2021    BASOPHILS 1 07/01/2021    BASOPHILS 0.00 07/01/2021    PMNABS 7.60 07/01/2021    LYMPHSABS 0.60 (L) 07/01/2021    EOSABS 0.00 07/01/2021    MONOSABS 0.70 07/01/2021        Hepatic Function:     Hepatic Function    Lab Results   Component Value Date/Time    ALBUMIN 4.1 07/01/2021 03:37 AM    TOTALPROTEIN 7.8 07/01/2021 03:37 AM    ALKPHOS 66 07/01/2021 03:37 AM    PROTHROMTME 12.6 (H) 06/27/2021 07:07 AM    INR 1.19 (H) 06/27/2021 07:07 AM    Lab Results   Component Value Date/Time    AST 12 (L) 07/01/2021 03:37 AM    ALT <7 (L) 07/01/2021 03:37 AM             Magnesium: MAGNESIUM  Lab Results   Component Value Date    MAGNESIUM 1.8 12/11/2020        PT: COAGULATION TESTS  Lab Results   Component Value Date    PROTHROMTME 12.6 (H) 06/27/2021    INR 1.19 (H) 06/27/2021    HCT 26.7 (L) 07/01/2021    PLTCNT 320 07/01/2021          INR:   No results found for this encounter  PTT:   No results found for this encounter  TROPONIN I  Lab Results   Component Value Date    UHCEASTTROPI 0.06 ([place]) 12/13/2020    TROPONINI 43 (H) 06/30/2021    TROPONINI 42 (H) 06/30/2021    TROPONINI 60 (H) 06/30/2021          No results found for: BNP     Last 3 Cardiac Markers:   Recent Labs     06/30/21  1753 06/30/21  2119    TROPONINI 46* 23*     Blood Gas: No results found for this encounter  Lipid Panel:  No results found for this encounter  TSH:  No results found for this encounter    Active Hospital Problems    Diagnosis   . Primary Problem: Chronic chest pain   . Coronary artery disease involving native coronary artery   . Hypertensive heart disease   . LVH (left ventricular hypertrophy)   . Mild aortic stenosis   . Noncompliance   . Elevated troponin I level   . Nausea   . Congestive heart failure (CMS HCC)   . ESRD (end stage renal disease) (CMS [place])   . Benign hypertension            Plan:   Patient with atypical chest pain.  Actually she has whole body pain.  She has nausea vomiting which could be the reason for her chest pain.  No evidence of acute coronary syndrome.  Her troponin I is chronically elevated due to extremely high blood pressure.  She has hypertensive heart disease with LVH.  Previously she had two-vessel coronary artery disease.  No intervention necessary at the moment.  Patient is to be compliant with her home  medications.  Blood pressure managed by nephrology.  No evidence of congestive heart failure.  She was volume loaded due to missing dialysis.  Compliance issue discussed with the patient.  Continue antianginal therapy with aspirin, beta-blocker, calcium channel blocker and statin therapy.    Sherlyn Lees, MD, [facility]      This note was partially generated using MModal Fluency Direct system, and there may be some incorrect words, spellings, and punctuation that were not noted in checking the note before saving.

## 2021-07-01 NOTE — Nurses Notes (Signed)
Patient discharged home . IV removed and intact, Tele removed.  AVS reviewed with patient.  A written copy of the AVS and discharge instructions was given to the patient.  Questions sufficiently answered as needed.  Patient encouraged to follow up with PCP as indicated.  In the event of an emergency, patient instructed to call 911 or go to the nearest emergency room. All personal belongings sent home with patient

## 2021-07-01 NOTE — Discharge Summary (Signed)
[place]     DISCHARGE SUMMARY      PATIENT NAME:  [name]   MRN:  [id number]  DOB:  1975-10-05    INPATIENT ADMISSION DATE: 06/30/2021   DATE OF DISCHARGE:  07/01/21     ATTENDING PHYSICIAN: Dionne Milo, MD     PRIMARY CARE PHYSICIAN: Cam Hai, [place] PRESENTATION:    Please see full admission H&P for details.      As per HPI:    Patient was admitted to [facility] and she presented to [facility] emergency room with chest pain nausea vomiting.  She is on Monday Wednesday Friday dialysis schedule missed dialysis on Friday due to transportation issues.  She is chronic chest pain.  She was admitted to hospital nephrology was consulted and she is getting hemodialyzed today.  I have discussed her case with Nephrology who feels her troponin elevation is chronic and agrees with discharge home.  Plan discharge home after dialysis.  Consultant input is appreciated.  I did discuss condition and plan with patient who verbalized understanding and agreement.    FURTHER HOSPITAL COURSE WITH DISCHARGE DIAGNOSES:          Problem List:  Active Hospital Problems   (*Primary Problem)    Diagnosis   . *Chest pain   . Elevated troponin     Chronic   . Nausea   . Congestive heart failure (CMS HCC)   . ESRD (end stage renal disease) (CMS HCC)     Chronic   . Uncontrolled hypertension     Chronic               PHYSICAL EXAM   DAY OF DISCHARGE:    BP 129/85   Pulse 65   Temp 36.9 C (98.4 F)   Resp 16   Ht 1.702 m (5' 7.01")   Wt 81.2 kg (179 lb)   SpO2 97%   BMI 28.03 kg/m          General:  Patient is resting in bed, no acute distress, alert and oriented x3   Eyes:  PERRL, no scleral icterus   HENT:  Normocephalic, atraumatic, oral mucosa is moist and pink, no nasal discharge   Heart:  RRR, S1 and S2 auscultated, no murmurs appreciated   Lungs:  Unlabored respirations.  Lungs are clear to auscultation bilaterally,   Abdomen:  Soft, active bowel sounds,    Neuro:  Alert. Speech intact.  Not tremulous  Psych:  Cooperative, not agitated    LABS:  CBC with Diff (Last 48 Hours):    Recent Results in last 48 hours     06/30/21  0720 07/01/21  0337   WBC 11.9* 8.9   HGB 9.4* 9.0*   HCT 29.0* 26.7*   MCV 94.5 91.4   PLTCNT 331 320   PMNS 85* 85*   LYMPHOCYTES 9* 6*   MONOCYTES 4 8   EOSINOPHIL 1 0   BASOPHILS 0  0.04 1  0.00          Last BMP  (Last result in the past 48 hours)      Na   K   Cl   CO2   BUN   Cr   Calcium   Glucose   Glucose-Fasting        07/01/21 0337 131   4.6   90   26   43   10.07   10.5  115           Last Hepatic Panel  (Last result in the past 48 hours)      Albumin   Total PTN   Total Bili   Direct Bili   Ast/SGOT   Alt/SGPT   Alk Phos        07/01/21 0337 4.1   7.8   0.5     12   <7   66              BMP (Last 48 Hours):    Recent Results in last 48 hours     06/30/21  0720 07/01/21  0337   SODIUM 131* 131*   POTASSIUM 4.2 4.6   CHLORIDE 90* 90*   CO2 25 26   BUN 26* 43*   CREATININE 8.78* 10.07*   CALCIUM 11.0* 10.5*   GLUCOSENF 105 115*          IMAGING:         DISCHARGE MEDICATIONS:     Current Discharge Medication List      CONTINUE these medications - NO CHANGES were made during your visit.      Details   albuterol sulfate 90 mcg/actuation oral inhaler  Commonly known as: PROVENTIL or VENTOLIN or PROAIR   1-2 Puffs, Inhalation, EVERY 6 HOURS PRN  Refills: 0     aspirin 81 mg Tablet, Delayed Release (E.C.)  Commonly known as: ECOTRIN   81 mg, Oral, DAILY  Refills: 0     cloNIDine HCL 0.1 mg Tablet  Commonly known as: CATAPRES   0.1 mg, Oral, 3 TIMES DAILY  Refills: 0     dilTIAZem HCL 120 mg Tablet  Commonly known as: CARDIZEM   120 mg, Oral, 3 TIMES DAILY  Refills: 0     doxazosin 4 mg Tablet  Commonly known as: CARDURA   4 mg, Oral, EVERY EVENING  Refills: 0     furosemide 80 mg Tablet  Commonly known as: LASIX   80 mg, Oral, 2 TIMES DAILY  Refills: 0     hydrOXYzine pamoate 25 mg Capsule  Commonly known as: VISTARIL   25 mg, Oral,  NIGHTLY PRN  Refills: 0     isosorbide mononitrate 60 mg Tablet Sustained Release 24 hr  Commonly known as: IMDUR   60 mg, Oral, EVERY MORNING  Refills: 0     labetaloL 200 mg Tablet  Commonly known as: NORMODYNE   200 mg, Oral, 3 TIMES DAILY, 1.5 tabs  Refills: 0     loratadine 10 mg Tablet  Commonly known as: CLARITIN   10 mg, Oral, DAILY  Refills: 0     losartan 50 mg Tablet  Commonly known as: COZAAR   50 mg, Oral, DAILY  Refills: 0     montelukast 10 mg Tablet  Commonly known as: SINGULAIR   10 mg, Oral, EVERY EVENING  Refills: 0     nitroGLYCERIN 0.4 mg Tablet, Sublingual  Commonly known as: NITROSTAT   0.4 mg, Sublingual, EVERY 5 MIN, for 3 doses over 15 minutes  Qty: 3 Tablet  Refills: 0     oxyCODONE-acetaminophen 5-325 mg Tablet  Commonly known as: PERCOCET   1 Tablet, Oral, EVERY 4 HOURS PRN  Qty: 12 Tablet  Refills: 0     pantoprazole 40 mg Tablet, Delayed Release (E.C.)  Commonly known as: PROTONIX   40 mg, Oral, DAILY  Refills: 0     polyethylene glycol 17 gram Powder in Packet  Commonly known as: MIRALAX   17 g, Oral, DAILY  Qty: 7 Packet  Refills: 0     sennosides-docusate sodium 8.6-50 mg Tablet  Commonly known as: SENOKOT-S   1 Tablet, Oral, 2 TIMES DAILY  Qty: 20 Tablet  Refills: 0     spironolactone 25 mg Tablet  Commonly known as: ALDACTONE   25 mg, Oral, DAILY  Refills: 0        STOP taking these medications.    carvediloL 25 mg Tablet  Commonly known as: COREG     clopidogreL 75 mg Tablet  Commonly known as: PLAVIX     hydrALAZINE 100 mg Tablet  Commonly known as: APRESOLINE     lactulose 10 gram/15 mL Solution  Commonly known as: ENULOSE               DISCHARGE DISPOSITION:  Home    DISCHARGE INSTRUCTIONS:    Follow-up Information     Eter, Ahmad, MD Follow up in 1 week(s).    Specialties: NEPHROLOGY, HOSPITALIST  Contact information:  [address]  [address]  [place] [address]  [phone]                       No discharge procedures on file.   Follow-up Information     Eter, Albertine Grates,  MD Follow up in 1 week(s).    Specialties: NEPHROLOGY, HOSPITALIST  Contact information:  [address]  [address]  [place] [address]  [phone]                                Copies sent to Care Team       Relationship Specialty Notifications Start End    Deel, Leona Carry, FNP PCP - General NURSE PRACTITIONER  12/04/20     Phone: [phone] Fax: [phone]         [place] [id number]          >30 minutes total were spent coordinating discharge day today      Dionne Milo, MD  [place] HOSPITALIST

## 2021-07-01 NOTE — Consults (Signed)
[name] 46 y.o. female 429/B   Date of Service: 07/01/2021    Date of Admission:  06/30/2021   PCP: Cam Hai, FNP Code Status:Full Code       Reason for Consultation:  ESRD on HD  HPI:   Patient was seen on hemodialysis today.  Reports no chest pain.  No shortness of breath.  No fevers or chills.  No abdominal pain.  Patient tolerated dialysis very well.  Patient is eager to go home.    ROS:   Systematic review of 12 organ systems was negative except what mentioned in in the HPI.        Current medications   acetaminophen (TYLENOL) tablet, 650 mg, Oral, Q4H PRN  cinacalcet (SENSIPAR) tablet, 60 mg, Oral, Daily with Breakfast  cloNIDine (CATAPRES-TTS) transdermal patch 0.3 mg, 0.3 mg, Transdermal, Q7 Days  heparin 5,000 unit/mL injection, 5,000 Units, Subcutaneous, Q8HRS  labetalol (NORMODYNE) tablet, 200 mg, Oral, Q12H  losartan (COZAAR) tablet, 100 mg, Oral, Daily  midodrine (PROAMITINE) tablet, 5 mg, Oral, DIALYSIS DAILY PRN  nitroGLYCERIN (NITRO-BID) 2 % topical ointment, 1 Inch, Apply Topically, 2x/day  NS flush syringe, 3 mL, Intracatheter, Q8HRS  NS flush syringe, 3 mL, Intracatheter, Q1H PRN  ondansetron (ZOFRAN) 2 mg/mL injection, 4 mg, Intravenous, Q8H PRN  scopolamine 1 mg over 3 days transdermal patch, 1 Patch, Transdermal, Q72H  sevelamer carbonate (RENVELA) tablet, 2,400 mg, Oral, 3x/day-Meals  spironolactone (ALDACTONE) tablet, 50 mg, Oral, 2x/day-Food            Physical:  Filed Vitals:    06/30/21 2022 06/30/21 2054 07/01/21 0800 07/01/21 1100   BP: 129/85  116/82    Pulse:  87 70 65   Resp:   18    Temp:   36.8 C (98.2 F)    SpO2:   97%         Intake/Output Summary (Last 24 hours) at 07/01/2021 1657  Last data filed at 07/01/2021 0500  Gross per 24 hour   Intake 940 ml   Output --   Net 940 ml        NAD   HEENT normocephalic atraumatic.  Eye exam normal inspection.    Mucous membranes moist, no jaundice.  Neck exam  no JVD normal inspection.  Cardiovascular system: Regular rate and rhythm no murmurs rubs or gallops. No chest wall tenderness  Lungs: Clear to auscultation bilaterally no wheezing no rhonchi.  Abdomen soft nontender nondistended.  Extremities no clubbing cyanosis or edema  -right upper chest PermCath  Normal speech.  EOMI    Labs:  CBC:     8.9 (05/08 0337) \   9.0* (05/08 6644) /   320 (05/08 0347)      / 26.7* (05/08 4259) \          BMP:   131* (05/08 5638) 90* (05/08 7564) 43* (05/08 3329)    /     115* (05/08 5188)   4.6 (05/08 4166) 26 (05/08 0630) 10.07* (05/08 1601) \                 Diagnostic studies:  Imaging:   ECG 12 LEAD  Sinus rhythm with 1st degree AV block  Moderate voltage criteria for LVH, may be normal variant ( Sokolow-Lyon , Cornell product )  Abnormal QRS-T angle, consider primary T wave abnormality  Abnormal ECG  When compared with ECG of  27-Jun-2021 06:39,  No significant change was found  Confirmed by Rana, Javed ([id number]) on 06/30/2021 12:59:40 PM  XR ABD FLAT AND UPRIGHT SERIES (W PA CHEST)  Narrative: [name]    RADIOLOGIST: Sula Rumple    XR ABD FLAT AND UPRIGHT SERIES (W PA CHEST) performed on 06/30/2021 7:52 AM    CLINICAL HISTORY: chest, abdo pain, vomiting. eval for boorhaves.  chest, abd pain, vomiting, eval for boorhaves.     TECHNIQUE: Single view chest with supine and upright views of the abdomen.    COMPARISON:  06/27/2021    FINDINGS:  The heart size is normal.   There are chronic-appearing changes of both lungs. There is no evidence of pleural effusion or pneumothorax. There is no evidence of pneumomediastinum.     Bowel gas pattern is normal.  No evidence of bowel obstruction.  No free air.    Atherosclerotic vascular calcifications identified.    There are degenerative changes of the spine.  Impression: NO EVIDENCE OF PNEUMOMEDIASTINUM OR PNEUMOTHORAX. IF THERE IS CLINICAL CONCERN FOR BOERHAAVE SYNDROME CT SCAN OF THE THORAX COULD BE PERFORMED.    Radiologist location ID:  [id number]      Assessments:  Active Hospital Problems   (*Primary Problem)    Diagnosis   . *Chronic chest pain   . Coronary artery disease involving native coronary artery   . Hypertensive heart disease   . LVH (left ventricular hypertrophy)   . Mild aortic stenosis   . Noncompliance   . Elevated troponin I level   . Nausea   . Congestive heart failure (CMS HCC)   . ESRD (end stage renal disease) (CMS HCC)     Chronic   . Benign hypertension       Plan:           End-stage renal disease on hemodialysis  -Continue dialysis 3 times a week on MWF  -please refer to dialysis orders  -patient tolerated dialysis well today.  Anemia  -Monitor  -Epogen     CKD MBD/secondary hyperparathyroidism   -Phosphorus binders  -sensipar  -binders for Phos > 5.5  -aim for Phos level 3.5-5.5  -Renal diet    Acid-base  -Stable  -Continue dialysis    Electrolytes  -Monitor  -See dialysis orders  -Replace as needed    Volume status  -Fluid restriction  -Dialysis    Diet  -Renal diet      HTN  Better  Home meds   HD         Patient is okay for discharge from Nephrology standpoint educated the patient about the importance of taking blood pressure medications as instructed      Beather Arbour, MD, FASN, 07/01/2021, 16:57

## 2021-07-07 ENCOUNTER — Emergency Department
Admission: EM | Admit: 2021-07-07 | Discharge: 2021-07-07 | Disposition: A | Discharge status: Home / Self Care | Payer: Commercial Managed Care - PPO | Attending: Emergency Medicine | Admitting: Emergency Medicine

## 2021-07-07 ENCOUNTER — Encounter (HOSPITAL_BASED_OUTPATIENT_CLINIC_OR_DEPARTMENT_OTHER): Payer: Self-pay

## 2021-07-07 ENCOUNTER — Emergency Department (HOSPITAL_COMMUNITY): Payer: Commercial Managed Care - PPO

## 2021-07-07 ENCOUNTER — Ambulatory Visit (HOSPITAL_COMMUNITY): Payer: Commercial Managed Care - PPO

## 2021-07-07 ENCOUNTER — Other Ambulatory Visit: Payer: Self-pay

## 2021-07-07 ENCOUNTER — Emergency Department (EMERGENCY_DEPARTMENT_HOSPITAL)
Admission: EM | Admit: 2021-07-07 | Discharge: 2021-07-08 | Disposition: A | Discharge status: Home / Self Care | Payer: Commercial Managed Care - PPO | Source: Home / Self Care | Attending: Emergency Medicine | Admitting: Emergency Medicine

## 2021-07-07 ENCOUNTER — Encounter (HOSPITAL_COMMUNITY): Payer: Self-pay

## 2021-07-07 DIAGNOSIS — Z992 Dependence on renal dialysis: Secondary | ICD-10-CM

## 2021-07-07 DIAGNOSIS — I509 Heart failure, unspecified: Secondary | ICD-10-CM | POA: Insufficient documentation

## 2021-07-07 DIAGNOSIS — F17210 Nicotine dependence, cigarettes, uncomplicated: Secondary | ICD-10-CM | POA: Insufficient documentation

## 2021-07-07 DIAGNOSIS — R079 Chest pain, unspecified: Secondary | ICD-10-CM | POA: Insufficient documentation

## 2021-07-07 DIAGNOSIS — I491 Atrial premature depolarization: Secondary | ICD-10-CM | POA: Insufficient documentation

## 2021-07-07 DIAGNOSIS — N186 End stage renal disease: Secondary | ICD-10-CM

## 2021-07-07 DIAGNOSIS — R0789 Other chest pain: Secondary | ICD-10-CM | POA: Insufficient documentation

## 2021-07-07 DIAGNOSIS — R000 Tachycardia, unspecified: Secondary | ICD-10-CM

## 2021-07-07 DIAGNOSIS — I132 Hypertensive heart and chronic kidney disease with heart failure and with stage 5 chronic kidney disease, or end stage renal disease: Secondary | ICD-10-CM

## 2021-07-07 DIAGNOSIS — R778 Other specified abnormalities of plasma proteins: Secondary | ICD-10-CM | POA: Insufficient documentation

## 2021-07-07 DIAGNOSIS — I440 Atrioventricular block, first degree: Secondary | ICD-10-CM | POA: Insufficient documentation

## 2021-07-07 DIAGNOSIS — N189 Chronic kidney disease, unspecified: Secondary | ICD-10-CM

## 2021-07-07 DIAGNOSIS — I10 Essential (primary) hypertension: Secondary | ICD-10-CM

## 2021-07-07 DIAGNOSIS — I5030 Unspecified diastolic (congestive) heart failure: Secondary | ICD-10-CM

## 2021-07-07 LAB — COMPREHENSIVE METABOLIC PANEL, NON-FASTING
ALBUMIN/GLOBULIN RATIO: 1.2 (ref 0.8–1.4)
ALBUMIN: 4.1 g/dL (ref 3.5–5.7)
ALKALINE PHOSPHATASE: 70 U/L (ref 34–104)
ALT (SGPT): <7 U/L — ABNORMAL LOW (ref 7–52)
ANION GAP: 11 mmol/L (ref 10–20)
AST (SGOT): 13 U/L (ref 13–39)
BILIRUBIN TOTAL: 0.6 mg/dL (ref 0.3–1.2)
BUN/CREA RATIO: 3 — ABNORMAL LOW (ref 6–22)
BUN: 26 mg/dL — ABNORMAL HIGH (ref 7–25)
CALCIUM, CORRECTED: 10.1 mg/dL (ref 8.9–10.8)
CALCIUM: 10.2 mg/dL (ref 8.6–10.3)
CHLORIDE: 96 mmol/L — ABNORMAL LOW (ref 98–107)
CO2 TOTAL: 26 mmol/L (ref 21–31)
CREATININE: 7.64 mg/dL — ABNORMAL HIGH (ref 0.60–1.30)
ESTIMATED GFR: 6 mL/min/{1.73_m2} — ABNORMAL LOW (ref >59)
GLOBULIN: 3.3 (ref 2.9–5.4)
GLUCOSE: 111 mg/dL — ABNORMAL HIGH (ref 74–109)
OSMOLALITY, CALCULATED: 272 mOsm/kg (ref 270–290)
POTASSIUM: 3.9 mmol/L (ref 3.5–5.1)
PROTEIN TOTAL: 7.4 g/dL (ref 6.4–8.9)
SODIUM: 133 mmol/L — ABNORMAL LOW (ref 136–145)

## 2021-07-07 LAB — CBC WITH DIFF
BASOPHIL #: 0.1 10*3/uL (ref 0.00–0.30)
BASOPHIL %: 1 % (ref 0–3)
EOSINOPHIL #: 0.3 10*3/uL (ref 0.00–0.80)
EOSINOPHIL %: 3 % (ref 0–7)
HCT: 24.5 % — ABNORMAL LOW (ref 37.0–47.0)
HGB: 8.5 g/dL — ABNORMAL LOW (ref 12.5–16.0)
LYMPHOCYTE #: 0.9 10*3/uL — ABNORMAL LOW (ref 1.10–5.00)
LYMPHOCYTE %: 10 % — ABNORMAL LOW (ref 25–45)
MCH: 31.4 pg (ref 27.0–32.0)
MCHC: 34.6 g/dL (ref 32.0–36.0)
MCV: 90.7 fL (ref 78.0–99.0)
MONOCYTE #: 0.8 10*3/uL (ref 0.00–1.30)
MONOCYTE %: 9 % (ref 0–12)
MPV: 6.5 fL — ABNORMAL LOW (ref 7.4–10.4)
NEUTROPHIL #: 7.3 10*3/uL (ref 1.80–8.40)
NEUTROPHIL %: 78 % — ABNORMAL HIGH (ref 40–76)
PLATELETS: 253 10*3/uL (ref 140–440)
RBC: 2.7 10*6/uL — ABNORMAL LOW (ref 4.20–5.40)
RDW: 18.3 % — ABNORMAL HIGH (ref 11.6–14.8)
WBC: 9.4 10*3/uL (ref 4.0–10.5)
WBCS UNCORRECTED: 9.4 10*3/uL

## 2021-07-07 LAB — PT/INR
INR: 1.17 (ref <=5.00)
PROTHROMBIN TIME: 13.6 seconds — ABNORMAL HIGH (ref 9.8–12.7)

## 2021-07-07 LAB — MAGNESIUM: MAGNESIUM: 2.2 mg/dL (ref 1.9–2.7)

## 2021-07-07 LAB — TROPONIN-I
TROPONIN I: 100 ng/L — ABNORMAL HIGH (ref <15)
TROPONIN I: 91 ng/L — ABNORMAL HIGH (ref <15)

## 2021-07-07 LAB — PTT (PARTIAL THROMBOPLASTIN TIME): APTT: 69.1 seconds — ABNORMAL HIGH (ref 26.0–36.0)

## 2021-07-07 LAB — B-TYPE NATRIURETIC PEPTIDE: BNP: 1384 pg/mL — ABNORMAL HIGH (ref 5–100)

## 2021-07-07 LAB — LACTIC ACID LEVEL W/ REFLEX FOR LEVEL >2.0: LACTIC ACID: 1.1 mmol/L (ref 0.5–2.2)

## 2021-07-07 MED ORDER — HYDROMORPHONE 2 MG/ML INJECTION WRAPPER
INJECTION | INTRAMUSCULAR | Status: AC
Start: 2021-07-07 — End: 2021-07-07
  Filled 2021-07-07: qty 1

## 2021-07-07 MED ORDER — ONDANSETRON HCL (PF) 4 MG/2 ML INJECTION SOLUTION
INTRAMUSCULAR | Status: AC
Start: 2021-07-07 — End: 2021-07-07
  Filled 2021-07-07: qty 2

## 2021-07-07 MED ORDER — ONDANSETRON HCL (PF) 4 MG/2 ML INJECTION SOLUTION
4.0000 mg | INTRAMUSCULAR | Status: AC
Start: 2021-07-08 — End: 2021-07-08
  Administered 2021-07-08: 4 mg via INTRAVENOUS

## 2021-07-07 MED ORDER — NITROGLYCERIN 2 % TRANSDERMAL OINTMENT - PACKET
1.0000 [in_us] | TOPICAL_OINTMENT | TRANSDERMAL | Status: AC
Start: 2021-07-08 — End: 2021-07-08
  Administered 2021-07-08: 1 [in_us] via TOPICAL

## 2021-07-07 MED ORDER — HYDROMORPHONE 2 MG/ML INJECTION WRAPPER
0.5000 mg | INJECTION | INTRAMUSCULAR | Status: AC
Start: 2021-07-08 — End: 2021-07-08
  Administered 2021-07-08: 0.5 mg via INTRAVENOUS

## 2021-07-07 MED ORDER — LABETALOL 20 MG/4 ML (5 MG/ML) INTRAVENOUS SYRINGE
20.0000 mg | INJECTION | INTRAVENOUS | Status: AC
Start: 2021-07-08 — End: 2021-07-08
  Administered 2021-07-08: 20 mg via INTRAVENOUS

## 2021-07-07 MED ORDER — NITROGLYCERIN 2 % TRANSDERMAL OINTMENT - PACKET
TOPICAL_OINTMENT | TRANSDERMAL | Status: AC
Start: 2021-07-07 — End: 2021-07-07
  Filled 2021-07-07: qty 1

## 2021-07-07 MED ORDER — LABETALOL 20 MG/4 ML (5 MG/ML) INTRAVENOUS SYRINGE
INJECTION | INTRAVENOUS | Status: AC
Start: 2021-07-07 — End: 2021-07-07
  Filled 2021-07-07: qty 4

## 2021-07-07 NOTE — ED Attending Note (Addendum)
[place] emergency department         HISTORY OF PRESENT ILLNESS     Date:  07/07/2021  Patient's Name:  [name]  Date of Birth:  22-Sep-1975    Patient is a 46 year old with longstanding history of chronic renal failure on hemodialysis Monday Wednesday Friday.  Patient presents tonight with chest pain left anterior chest radiating down her left arm symptoms for the last 2 days.  Patient was seen in the emergency room at [facility] thinks chest pain yesterday.  Patient with elevated blood pressure unable to keep anything down due to nausea and vomiting for the last 24 hours.  Patient was given aspirin and 3 sublingual nitro on the way to the emergency room.          Review of Systems     Review of Systems   HENT: Negative.    Eyes: Negative.    Respiratory: Positive for chest tightness and shortness of breath.    Cardiovascular: Positive for chest pain.   Genitourinary: Negative.    Musculoskeletal: Negative.    Skin: Negative.    Neurological: Negative.    Hematological: Negative.    Psychiatric/Behavioral: Negative.    All other systems reviewed and are negative.      Previous History     Past Medical History:  Past Medical History:   Diagnosis Date   . Asthma    . Chronic diastolic CHF (congestive heart failure) (CMS [facility])    . Esophageal reflux    . ESRD (end stage renal disease) (CMS [facility])    . History of anemia due to CKD    . Hypertension    . MI (myocardial infarction) (CMS [facility])    . Mitral valve regurgitation    . Pulmonary edema    . Sleep apnea    . Tricuspid valve regurgitation        Past Surgical History:  Past Surgical History:   Procedure Laterality Date   . Esophagogastroduodenoscopy     . Hx back surgery     . Hx cholecystectomy     . Hx hysterectomy     . Hx tonsillectomy         Social History:  Social History     Tobacco Use   . Smoking status: Every Day     Packs/day: 0.50     Types: Cigarettes   . Smokeless tobacco: Never   Vaping  Use   . Vaping Use: Never used   Substance Use Topics   . Alcohol use: Not Currently   . Drug use: Yes     Types: Marijuana     Social History     Substance and Sexual Activity   Drug Use Yes   . Types: Marijuana       Family History:  Family History   Problem Relation Age of Onset   . Diabetes type II Mother    . Coronary Artery Disease Mother    . Breast Cancer Mother    . Hypertension (High Blood Pressure) Father        Medication History:  Current Outpatient Medications   Medication Sig   . albuterol sulfate (PROVENTIL OR VENTOLIN OR PROAIR) 90 mcg/actuation Inhalation oral inhaler Take 1-2 Puffs by inhalation Every 6 hours as needed   . aspirin (ECOTRIN) 81 mg Oral Tablet, Delayed Release (E.C.) Take 1 Tablet (81 mg total) by mouth Once a day   . cloNIDine HCL (  CATAPRES) 0.1 mg Oral Tablet Take 1 Tablet (0.1 mg total) by mouth in the morning and 1 Tablet (0.1 mg total) at noon and 1 Tablet (0.1 mg total) before bedtime.   . dilTIAZem HCL (CARDIZEM) 120 mg Oral Tablet Take 1 Tablet (120 mg total) by mouth in the morning and 1 Tablet (120 mg total) at noon and 1 Tablet (120 mg total) before bedtime.   Marland Kitchen doxazosin (CARDURA) 4 mg Oral Tablet Take 1 Tablet (4 mg total) by mouth Every evening   . furosemide (LASIX) 80 mg Oral Tablet Take 1 Tablet (80 mg total) by mouth in the morning and 1 Tablet (80 mg total) in the evening.   . hydrOXYzine pamoate (VISTARIL) 25 mg Oral Capsule Take 1 Capsule (25 mg total) by mouth Every night as needed for Itching   . isosorbide mononitrate (IMDUR) 60 mg Oral Tablet Sustained Release 24 hr Take 1 Tablet (60 mg total) by mouth Every morning   . labetaloL (NORMODYNE) 200 mg Oral Tablet Take 1 Tablet (200 mg total) by mouth in the morning and 1 Tablet (200 mg total) at noon and 1 Tablet (200 mg total) before bedtime. 1.5 tabs.   Marland Kitchen loratadine (CLARITIN) 10 mg Oral Tablet Take 1 Tablet (10 mg total) by mouth Once a day   . losartan (COZAAR) 50 mg Oral Tablet Take 1 Tablet (50 mg total)  by mouth Once a day   . montelukast (SINGULAIR) 10 mg Oral Tablet Take 1 Tablet (10 mg total) by mouth Every evening   . nitroGLYCERIN (NITROSTAT) 0.4 mg Sublingual Tablet, Sublingual Place 1 Tablet (0.4 mg total) under the tongue Every 5 minutes for 3 doses for 3 doses over 15 minutes   . oxyCODONE-acetaminophen (PERCOCET) 5-325 mg Oral Tablet Take 1 Tablet by mouth Every 4 hours as needed   . pantoprazole (PROTONIX) 40 mg Oral Tablet, Delayed Release (E.C.) Take 1 Tablet (40 mg total) by mouth Once a day   . polyethylene glycol (MIRALAX) 17 gram Oral Powder in Packet Take 1 Packet (17 g total) by mouth Once a day   . sennosides-docusate sodium (SENOKOT-S) 8.6-50 mg Oral Tablet Take 1 Tablet by mouth Twice daily   . spironolactone (ALDACTONE) 25 mg Oral Tablet Take 1 Tablet (25 mg total) by mouth Once a day       Allergies:  Allergies   Allergen Reactions   . Lisinopril Swelling       Physical Exam     Vitals:    BP (!) 190/95   Pulse 82   Temp 36.4 C (97.5 F)   Resp 16   Ht 1.702 m (5\' 7" )   Wt 81.6 kg (180 lb)   SpO2 98%   BMI 28.19 kg/m           Physical Exam  Vitals and nursing note reviewed.   Constitutional:       General: She is not in acute distress.     Appearance: She is well-developed.   HENT:      Head: Normocephalic and atraumatic.   Eyes:      Conjunctiva/sclera: Conjunctivae normal.   Cardiovascular:      Rate and Rhythm: Normal rate and regular rhythm.      Heart sounds: No murmur heard.  Pulmonary:      Effort: Pulmonary effort is normal. No respiratory distress.      Breath sounds: Normal breath sounds.   Abdominal:      Palpations: Abdomen is soft.  Tenderness: There is no abdominal tenderness.   Musculoskeletal:         General: No swelling.      Cervical back: Neck supple.   Skin:     General: Skin is warm and dry.      Capillary Refill: Capillary refill takes less than 2 seconds.   Neurological:      Mental Status: She is alert.   Psychiatric:         Mood and Affect: Mood  normal.         Diagnostic Studies/Treatment     Medications:  Medications Administered in the ED   labetalol (TRANDATE) 5 mg/mL injection (20 mg Intravenous Given 07/08/21 0016)   HYDROmorphone (DILAUDID) 2 mg/mL injection (0.5 mg Intravenous Given 07/08/21 0016)   ondansetron (ZOFRAN) 2 mg/mL injection (4 mg Intravenous Given 07/08/21 0016)   nitroGLYCERIN (NITRO-BID) 2 % topical ointment (1 Inch Apply Topically Given 07/08/21 0018)   labetalol (TRANDATE) 5 mg/mL injection (20 mg Intravenous Given 07/08/21 0054)   hydrALAZINE (APRESOLINE) tablet (100 mg Oral Given 07/08/21 0055)   hydrALAZINE (APRESOLINE) injection 20 mg (20 mg Intravenous Given 07/08/21 0214)   cloNIDine (CATAPRES) tablet (0.2 mg Oral Given 07/08/21 0214)   prochlorperazine (COMPAZINE) 5 mg/mL injection (10 mg Intravenous Given 07/08/21 0214)   losartan (COZAAR) tablet (50 mg Oral Given 07/08/21 0454)   dilTIAZem (CARDIZEM) tablet (90 mg Oral Given 07/08/21 0455)       New Prescriptions    No medications on file       Labs:    Results for orders placed or performed during the hospital encounter of 07/07/21 (from the past 12 hour(s))   NT-PROBNP   Result Value Ref Range    NT-PROBNP >35,000 (H) <=125 pg/mL   PT/INR   Result Value Ref Range    PROTHROMBIN TIME 12.7 (H) 9.2 - 12.1 seconds    INR 1.20 (H) 0.88 - 1.10   TROPONIN-I   Result Value Ref Range    TROPONIN I 79 (H) <15 ng/L   CBC WITH DIFF   Result Value Ref Range    WBCS UNCORRECTED      WBC 12.1 (H) 4.0 - 10.5 x10^3/uL    RBC 3.18 (L) 4.20 - 5.40 x10^6/uL    HGB 9.5 (L) 12.5 - 16.0 g/dL    HCT 30.0 (L) 37.0 - 47.0 %    MCV 94.3 78.0 - 99.0 fL    MCH 29.8 27.0 - 32.0 pg    MCHC 31.6 (L) 32.0 - 36.0 g/dL    RDW 18.2 (H) 11.6 - 14.8 %    PLATELETS 276 140 - 440 x10^3/uL    MPV 7.6 7.4 - 10.4 fL   MANUAL DIFFERENTIAL   Result Value Ref Range    WBC 12.1 x10^3/uL    NEUTROPHIL % 72 40 - 76 %    LYMPHOCYTE % 18 (L) 25 - 45 %    MONOCYTE % 9 0 - 12 %    EOSINOPHIL % 1 0 - 7 %    BASOPHIL %       METAMYELOCYTE %      MYELOCYTE %      PROMYELOCYTE %      BAND %      BLAST %      OTHER %      NEUTROPHIL ABSOLUTE 8.71 (H) 1.80 - 8.40 x10^3/uL    LYMPHOCYTE ABSOLUTE 2.18 1.10 - 5.00 x10^3/uL    MONOCYTE ABSOLUTE 1.09 0.00 -  1.30 x10^3/uL    EOSINOPHIL ABSOLUTE 0.12 0.00 - 0.80 x10^3/uL    BASOPHIL ABSOLUTE      METAMYELOCYTE ABSOLUTE      MYELOCYTE ABSOLUTE      PROMYELOCYTE ABSOLUTE      BLAST ABSOLUTE      OTHER CELL ABSOLUTE      ANISOCYTOSIS 1+     POLYCHROMASIA      POIKILOCYTOSIS      BASOPHILIC STIPPLING      MICROCYTOSIS      MACROCYTOSIS      ROULEAUX      SCHISTOCYTES      SPHEROCYTES      TARGET CELLS      TEARDROP CELLS      OVALOCYTE (ELLIPTOCYTE)      CRENATED RED CELLS      STOMATOCYTES      ACANTHOCYTES (SPUR CELL)      ECHINOCYTE (BURR CELL)      BLISTER CELLS      RBC AGGLUTINATES      HOWELL JOLLY BODIES      ATYPICAL LYMPHOCYTES      TOXIC GRANULATION      DOHLE BODIES      TOXIC VACUOLIZATION      AUER RODS      BASKET CELLS      HYPERSEGMENTATION      LARGE PLATELETS      PLATELET CLUMPS      WBC MORPHOLOGY COMMENT      RBC MORPHOLOGY COMMENT      PLATELET MORPHOLOGY COMMENT Normal     BANDS NEUTROPHILS MANUAL      BAND ABSOLUTE      NEUTROPHILS MANUAL 72     LYMPHOCYTES MANUAL 18     MONOCYTES MANUAL 9     EOSINOPHILS MANUAL 1     BASOPHILS MANUAL      PROMYELOCYTES MANUAL      MYELOCYTES MANUAL      METAMYELOCYTES MANUAL      BLASTS MANUAL      TOTAL CELLS COUNTED [#] IN BLOOD 100     OTHER CELLS MANUAL      NUCLEATED RBC MANUAL      PLASMA CELL %      PLASMA CELL ABSOLUE      PLASMA CELLS MANUAL      HYPOCHROMASIA Slight    TROPONIN-I   Result Value Ref Range    TROPONIN I 97 (H) <15 ng/L        Radiology:  XR CHEST AP    XR CHEST AP   Final Result   IMPROVEMENT IN AERATION COMPARED WITH YESTERDAY'S EXAM.         Radiologist location ID: [id number]             ECG:  Sinus tachycardia with premature atrial complexes nonspecific ST-T changes            Differential diagnosis  Chest pain  atypical, chronic renal failure on hemodialysis, uncontrolled hypertension    Course/Disposition/Plan     Course:      Patient with chronically elevated troponin patient has been evaluated for this chest pain on multiple locations patient not a candidate for inpatient management will be discharged home.  Blood pressure controlled with oral medications patient is due for hemodialysis today 11 50 a.m.  Disposition:    Discharged    Condition at Disposition:      Stable  Follow up:   Cam Hai, FNP  [address]  [place] [address]  [phone]    Schedule an appointment as soon as possible for a visit   If symptoms worsen      Clinical Impression:     Clinical Impression   Chest pain, unspecified type (Primary)   Severe uncontrolled hypertension   Chronic renal failure, unspecified CKD stage         Winfred Burn, MD

## 2021-07-07 NOTE — ED Triage Notes (Signed)
Per EMS, patient c/o left sided chest pain that radiates into the back and the left arm X 2 days. Was seen at [facility] last night for same. Last HD was on Friday. 20 g left forearm started by EMS. Was given 0.4 NTG by EMS with no change.

## 2021-07-07 NOTE — ED Triage Notes (Signed)
Brought to ED with c/o chest pain x1 hour. Given 324mg  ASA, NTG X1 PTA.

## 2021-07-07 NOTE — ED Provider Notes (Signed)
[name]  ED Primary Provider Note  History of Present Illness   chief complaint    Arrival: The patient arrived by Ambulance         [name] is a 46 y.o. female who had concerns including Chest Pain .  Patient 46 year old female who presents emergency room by EMS with left-sided chest pain onset approximately 1 hour prior to arrival.  This occurred while at rest.  The pain is sharp and tight in nature.  It is reproducible with palpation on the left side of the chest and along the left costosternal border.  She denies any shortness of breath.  We are very familiar with this patient.  She also attends dialysis.  She states she has not missed any dialysis this week and last had dialysis on Friday (yesterday).  Patient continues to smoke 1/2-1 pack of cigarettes per day.  All nursing notes reviewed        Review of Systems     No other overt Review of Systems are noted to be positive except noted in the HPI.      Historical Data   History Reviewed This Encounter: Medical History  Surgical History  Family History  Social History        Physical Exam   ED Triage Vitals [07/07/21 0012]   BP (Non-Invasive) (!) 188/114   Heart Rate 86   Respiratory Rate 16   Temperature 36.4 C (97.6 F)   SpO2 99 %   Weight 81.6 kg (180 lb)   Height 1.803 m ('5\' 11"'$ )         Exam:   Constitutional:  Patient alert orient x3 in no apparent distress.  No limitations.  Head: Atraumatic normocephalic  Eyes :  Pupils are equal round reactive to light and accommodation extraocular muscles are intact.  Sclera and conjunctiva are unremarkable  Ears:  Tympanic membranes are pearly gray bilaterally; external auditory canals are unremarkable; external ears without any lesions  Nose:  Nares are patent turbinates are pink and moist  Mouth:  Mucosa is pink and moist without lesions.  Posterior pharynx is pink and moist without hypertrophy/exudate.  Neck:  Soft and supple without palpable  lymphadenopathy.  Chest:  Patient is tender to palpation over the anterior chest wall primarily over the left costosternal border.  Palpation in this area completely reproduces symptoms.  Heart:  Tachycardic, regular; 1/6 holosystolic ejection murmur   Lungs:  Clear to auscultation bilaterally without any wheezing/rales/rhonchi  Abdomen:  Soft nontender without any rebound or guarding; positive bowel sounds throughout  Genitalia:  Deferred  Skin:  Warm and dry without lesions.  Normal skin turgor.  Brisk capillary refill distally.  Numerous scars to left upper extremity and anterior chest wall secondary to prior access/fistula  Extremities:  Good strenght bilaterally with full range of motion of upper and lower extremities.  Neuro:  Alert oriented x3.  Cranial nerves II-XII grossly intact as tested.  Excellent sensation distally over all dermatomes.  Psychiatric:  Patient cooperative, affect appropriate          Procedures      Patient Data     Labs Ordered/Reviewed   TROPONIN-I - Abnormal; Notable for the following components:       Result Value    TROPONIN I 100 (*)     All other components within normal limits   TROPONIN-I - Abnormal; Notable for the following components:    TROPONIN I 91 (*)  All other components within normal limits   COMPREHENSIVE METABOLIC PANEL, NON-FASTING - Abnormal; Notable for the following components:    SODIUM 133 (*)     CHLORIDE 96 (*)     BUN 26 (*)     CREATININE 7.64 (*)     BUN/CREA RATIO 3 (*)     ESTIMATED GFR 6 (*)     GLUCOSE 111 (*)     ALT (SGPT) <7 (*)     All other components within normal limits    Narrative:     Estimated Glomerular Filtration Rate (eGFR) is calculated using the CKD-EPI (2021) equation, intended for patients 74 years of age and older. If gender is not documented or "unknown", there will be no eGFR calculation.   PT/INR - Abnormal; Notable for the following components:    PROTHROMBIN TIME 13.6 (*)     All other components within normal limits     Narrative:     INR OF 2.0-3.0  RECOMMENDED FOR: PROPHYLAXIS/TREATMENT OF VENEOUS THROMBOSIS, PULMONARY EMBOLISM, PREVENTION OF SYSTEMIC EMBOLISM FROM ATRIAL FIBRILATION, MYOCARDIAL INFARCTION.    INR OF 2.5-3.5  RECOMMENDED FOR MECHANICAL PROSTHETIC HEART VALVES, RECURRENT SYSTEMIC EMBOLISM, RECURRENT MYOCARDIAL INFARCTION.     PTT (PARTIAL THROMBOPLASTIN TIME) - Abnormal; Notable for the following components:    APTT 69.1 (*)     All other components within normal limits   CBC WITH DIFF - Abnormal; Notable for the following components:    RBC 2.70 (*)     HGB 8.5 (*)     HCT 24.5 (*)     RDW 18.3 (*)     MPV 6.5 (*)     NEUTROPHIL % 78 (*)     LYMPHOCYTE % 10 (*)     LYMPHOCYTE # 0.90 (*)     All other components within normal limits   B-TYPE NATRIURETIC PEPTIDE - Abnormal; Notable for the following components:    BNP 1,384 (*)     All other components within normal limits    Narrative:                                 Class 1: 101-250 pg/mL                              Class 2: 251-550 pg/mL                              Class 3: 551-900 pg/mL                              Class 4: >901 pg/mL     The [employer] Association has developed a four-stage functional classification system for CHF that is based on a subjective interpretation of the severity of a patient's clinical signs and symptoms.    Class 1 - Patients have no limitations on physical activity and have no symptoms with ordinary physical activity.    Class 2 - Patients have a slight limitation of physical activity and have symptoms with ordinary physical activity.    Class 3 - Patients have a marked limitation of physical activity and have symptoms with less than ordinary physical activity, but not at rest.    Class 4 - Patients are unable to perform any physical  activity without discomfort.   MAGNESIUM - Normal   LACTIC ACID LEVEL W/ REFLEX FOR LEVEL >2.0 - Normal   CBC/DIFF    Narrative:     The following orders were created for panel order  CBC/DIFF.  Procedure                               Abnormality         Status                     ---------                               -----------         ------                     CBC WITH DIFF[[id number]]                Abnormal            Final result                 Please view results for these tests on the individual orders.   TROPONIN-I       XR AP MOBILE CHEST   Final Result by Edi, Radresults In (05/14 0116)   Pulmonary edema         Radiologist location ID: [place] Making          Medical Decision Making  I reviewed laboratory workup as well as EKG with Dr. Deloria Lair.  He feels that this is the patient's baseline.  She is not experiencing any pain at this time.  She states the pain comes and goes and it is reproducible.  Will discharge patient home.  She will follow up with dialysis on Monday.  See discharge instructions for detailed    Amount and/or Complexity of Data Reviewed  Labs: ordered. Decision-making details documented in ED Course.  Radiology: ordered and independent interpretation performed. Decision-making details documented in ED Course.  ECG/medicine tests: ordered and independent interpretation performed. Decision-making details documented in ED Course.      Critical Care  Total time providing critical care: 0 minutes      ED Course as of 07/07/21 0406   Sun Jul 07, 2021   0045 EKG shows sinus rhythm with a heart rate of 79, first-degree AV block with PR interval of 214.  normal axis, late R-wave progression, no ectopy, left ventricular hypertrophy by voltage, normal normal QRS interval   0258 Sodium 133, chloride 96, BUN 26, creatinine 7.64.  This is actually an excellent creatinine for patient.   61 White blood count 9.4, hemoglobin 8.5, hematocrit 24.5, platelets 243; hemoglobin 1 week ago 9.0   0259 Magnesium 2.2   0259 BNP 1384   0259 Troponin 100   0402 Initial troponin 100, repeat troponin 91   0403 BNP 1384.              Following the history,  physical exam, and ED workup, the patient was deemed stable and suitable for discharge. The patient/caregiver was advised to return to the ED for any new or worsening symptoms. Discharge medications, and follow-up instructions were discussed with the patient/caregiver in detail, who verbalizes understanding. The patient/caregiver is in agreement and  is comfortable with the plan of care.    Disposition: Discharged         Current Discharge Medication List      CONTINUE these medications - NO CHANGES were made during your visit.      Details   albuterol sulfate 90 mcg/actuation oral inhaler  Commonly known as: PROVENTIL or VENTOLIN or PROAIR   1-2 Puffs, Inhalation, EVERY 6 HOURS PRN  Refills: 0     aspirin 81 mg Tablet, Delayed Release (E.C.)  Commonly known as: ECOTRIN   81 mg, Oral, DAILY  Refills: 0     cloNIDine HCL 0.1 mg Tablet  Commonly known as: CATAPRES   0.1 mg, Oral, 3 TIMES DAILY  Refills: 0     dilTIAZem HCL 120 mg Tablet  Commonly known as: CARDIZEM   120 mg, Oral, 3 TIMES DAILY  Refills: 0     doxazosin 4 mg Tablet  Commonly known as: CARDURA   4 mg, Oral, EVERY EVENING  Refills: 0     furosemide 80 mg Tablet  Commonly known as: LASIX   80 mg, Oral, 2 TIMES DAILY  Refills: 0     hydrOXYzine pamoate 25 mg Capsule  Commonly known as: VISTARIL   25 mg, Oral, NIGHTLY PRN  Refills: 0     isosorbide mononitrate 60 mg Tablet Sustained Release 24 hr  Commonly known as: IMDUR   60 mg, Oral, EVERY MORNING  Refills: 0     labetaloL 200 mg Tablet  Commonly known as: NORMODYNE   200 mg, Oral, 3 TIMES DAILY, 1.5 tabs  Refills: 0     loratadine 10 mg Tablet  Commonly known as: CLARITIN   10 mg, Oral, DAILY  Refills: 0     losartan 50 mg Tablet  Commonly known as: COZAAR   50 mg, Oral, DAILY  Refills: 0     montelukast 10 mg Tablet  Commonly known as: SINGULAIR   10 mg, Oral, EVERY EVENING  Refills: 0     nitroGLYCERIN 0.4 mg Tablet, Sublingual  Commonly known as: NITROSTAT   0.4 mg, Sublingual, EVERY 5 MIN, for 3  doses over 15 minutes  Qty: 3 Tablet  Refills: 0     oxyCODONE-acetaminophen 5-325 mg Tablet  Commonly known as: PERCOCET   1 Tablet, Oral, EVERY 4 HOURS PRN  Qty: 12 Tablet  Refills: 0     pantoprazole 40 mg Tablet, Delayed Release (E.C.)  Commonly known as: PROTONIX   40 mg, Oral, DAILY  Refills: 0     polyethylene glycol 17 gram Powder in Packet  Commonly known as: MIRALAX   17 g, Oral, DAILY  Qty: 7 Packet  Refills: 0     sennosides-docusate sodium 8.6-50 mg Tablet  Commonly known as: SENOKOT-S   1 Tablet, Oral, 2 TIMES DAILY  Qty: 20 Tablet  Refills: 0     spironolactone 25 mg Tablet  Commonly known as: ALDACTONE   25 mg, Oral, DAILY  Refills: 0          Follow up:   Cam Hai, [address]  [place] [address]  [phone]    In 2 days  Follow-up with family doctor for recheck in 2-3 days                 Clinical Impression   Atypical chest pain (Primary)   End-stage renal disease (ESRD) (CMS [facility])         Current Discharge Medication List  Jacklynn Ganong, DO  Department of Emergency Medicine

## 2021-07-07 NOTE — ED Nurses Note (Addendum)
Reports centered CP with left arm pain that is constant.

## 2021-07-07 NOTE — Discharge Instructions (Signed)
Take all regular medications as prescribed  Stop smoking  Follow-up with your family doctor for recheck on Monday/Tuesday  Be sure to go to dialysis on Monday  Return to emergency room for any worsening chest pain, shortness of breath, or any concerns

## 2021-07-08 ENCOUNTER — Emergency Department (HOSPITAL_BASED_OUTPATIENT_CLINIC_OR_DEPARTMENT_OTHER): Payer: Commercial Managed Care - PPO

## 2021-07-08 LAB — MANUAL DIFFERENTIAL
EOSINOPHIL %: 1 % (ref 0–7)
EOSINOPHIL ABSOLUTE: 0.12 10*3/uL (ref 0.00–0.80)
EOSINOPHILS MANUAL: 1
LYMPHOCYTE %: 18 % — ABNORMAL LOW (ref 25–45)
LYMPHOCYTE ABSOLUTE: 2.18 10*3/uL (ref 1.10–5.00)
LYMPHOCYTES MANUAL: 18
MONOCYTE %: 9 % (ref 0–12)
MONOCYTE ABSOLUTE: 1.09 10*3/uL (ref 0.00–1.30)
MONOCYTES MANUAL: 9
NEUTROPHIL %: 72 % (ref 40–76)
NEUTROPHIL ABSOLUTE: 8.71 10*3/uL — ABNORMAL HIGH (ref 1.80–8.40)
NEUTROPHILS MANUAL: 72
PLATELET MORPHOLOGY COMMENT: NORMAL
TOTAL CELLS COUNTED [#] IN BLOOD: 100
WBC: 12.1 10*3/uL

## 2021-07-08 LAB — CBC WITH DIFF
HCT: 30 % — ABNORMAL LOW (ref 37.0–47.0)
HGB: 9.5 g/dL — ABNORMAL LOW (ref 12.5–16.0)
MCH: 29.8 pg (ref 27.0–32.0)
MCHC: 31.6 g/dL — ABNORMAL LOW (ref 32.0–36.0)
MCV: 94.3 fL (ref 78.0–99.0)
MPV: 7.6 fL (ref 7.4–10.4)
PLATELETS: 276 10*3/uL (ref 140–440)
RBC: 3.18 10*6/uL — ABNORMAL LOW (ref 4.20–5.40)
RDW: 18.2 % — ABNORMAL HIGH (ref 11.6–14.8)
WBC: 12.1 10*3/uL — ABNORMAL HIGH (ref 4.0–10.5)

## 2021-07-08 LAB — ECG 12 LEAD
Atrial Rate: 110 {beats}/min
Calculated P Axis: 85 degrees
Calculated R Axis: 65 degrees
Calculated T Axis: 100 degrees
PR Interval: 188 ms
QRS Duration: 96 ms
QT Interval: 344 ms
QTC Calculation: 465 ms
Ventricular rate: 110 {beats}/min

## 2021-07-08 LAB — TROPONIN-I
TROPONIN I: 79 ng/L — ABNORMAL HIGH (ref <15)
TROPONIN I: 97 ng/L — ABNORMAL HIGH (ref <15)

## 2021-07-08 LAB — BASIC METABOLIC PANEL
ANION GAP: 11 mmol/L (ref 10–20)
BUN/CREA RATIO: 3 — ABNORMAL LOW (ref 6–22)
BUN: 25 mg/dL (ref 7–25)
CALCIUM: 10.2 mg/dL (ref 8.6–10.3)
CHLORIDE: 98 mmol/L (ref 98–107)
CO2 TOTAL: 27 mmol/L (ref 21–31)
CREATININE: 9.3 mg/dL — ABNORMAL HIGH (ref 0.60–1.30)
ESTIMATED GFR: 5 mL/min/{1.73_m2} — ABNORMAL LOW (ref >59)
GLUCOSE: 116 mg/dL — ABNORMAL HIGH (ref 74–109)
OSMOLALITY, CALCULATED: 277 mOsm/kg (ref 270–290)
POTASSIUM: 4 mmol/L (ref 3.5–5.1)
SODIUM: 136 mmol/L (ref 136–145)

## 2021-07-08 LAB — PT/INR
INR: 1.2 — ABNORMAL HIGH (ref 0.88–1.10)
PROTHROMBIN TIME: 12.7 seconds — ABNORMAL HIGH (ref 9.2–12.1)

## 2021-07-08 LAB — NT-PROBNP: NT-PROBNP: >35000 pg/mL — ABNORMAL HIGH (ref <=125)

## 2021-07-08 MED ORDER — PROCHLORPERAZINE EDISYLATE 10 MG/2 ML (5 MG/ML) INJECTION SOLUTION
10.0000 mg | INTRAMUSCULAR | Status: AC
Start: 2021-07-08 — End: 2021-07-08
  Administered 2021-07-08: 10 mg via INTRAVENOUS

## 2021-07-08 MED ORDER — HYDRALAZINE 20 MG/ML INJECTION SOLUTION
INTRAMUSCULAR | Status: AC
Start: 2021-07-08 — End: 2021-07-08
  Filled 2021-07-08: qty 1

## 2021-07-08 MED ORDER — DILTIAZEM 30 MG TABLET
90.0000 mg | ORAL_TABLET | ORAL | Status: AC
Start: 2021-07-08 — End: 2021-07-08
  Administered 2021-07-08: 90 mg via ORAL

## 2021-07-08 MED ORDER — LOSARTAN 50 MG TABLET
50.0000 mg | ORAL_TABLET | ORAL | Status: AC
Start: 2021-07-08 — End: 2021-07-08
  Administered 2021-07-08: 50 mg via ORAL

## 2021-07-08 MED ORDER — LABETALOL 20 MG/4 ML (5 MG/ML) INTRAVENOUS SYRINGE
INJECTION | INTRAVENOUS | Status: AC
Start: 2021-07-08 — End: 2021-07-08
  Filled 2021-07-08: qty 4

## 2021-07-08 MED ORDER — HYDRALAZINE 50 MG TABLET
ORAL_TABLET | ORAL | Status: AC
Start: 2021-07-08 — End: 2021-07-08
  Filled 2021-07-08: qty 2

## 2021-07-08 MED ORDER — HYDRALAZINE 20 MG/ML INJECTION SOLUTION
20.0000 mg | INTRAMUSCULAR | Status: AC
Start: 2021-07-08 — End: 2021-07-08
  Administered 2021-07-08: 20 mg via INTRAVENOUS

## 2021-07-08 MED ORDER — CLONIDINE HCL 0.2 MG TABLET
ORAL_TABLET | ORAL | Status: AC
Start: 2021-07-08 — End: 2021-07-08
  Filled 2021-07-08: qty 1

## 2021-07-08 MED ORDER — HYDRALAZINE 50 MG TABLET
100.0000 mg | ORAL_TABLET | ORAL | Status: AC
Start: 2021-07-08 — End: 2021-07-08
  Administered 2021-07-08: 100 mg via ORAL

## 2021-07-08 MED ORDER — CLONIDINE HCL 0.2 MG TABLET
0.2000 mg | ORAL_TABLET | ORAL | Status: AC
Start: 2021-07-08 — End: 2021-07-08
  Administered 2021-07-08: 0.2 mg via ORAL

## 2021-07-08 MED ORDER — PROCHLORPERAZINE EDISYLATE 10 MG/2 ML (5 MG/ML) INJECTION SOLUTION
INTRAMUSCULAR | Status: AC
Start: 2021-07-08 — End: 2021-07-08
  Filled 2021-07-08: qty 2

## 2021-07-08 MED ORDER — LABETALOL 20 MG/4 ML (5 MG/ML) INTRAVENOUS SYRINGE
20.0000 mg | INJECTION | INTRAVENOUS | Status: AC
Start: 2021-07-08 — End: 2021-07-08
  Administered 2021-07-08: 20 mg via INTRAVENOUS

## 2021-07-08 MED ORDER — DILTIAZEM 30 MG TABLET
ORAL_TABLET | ORAL | Status: AC
Start: 2021-07-08 — End: 2021-07-08
  Filled 2021-07-08: qty 3

## 2021-07-08 MED ORDER — LOSARTAN 50 MG TABLET
ORAL_TABLET | ORAL | Status: AC
Start: 2021-07-08 — End: 2021-07-08
  Filled 2021-07-08: qty 1

## 2021-07-08 NOTE — Discharge Instructions (Signed)
Follow-up today for hemodialysis and

## 2021-07-09 ENCOUNTER — Inpatient Hospital Stay
Admission: RE | Admit: 2021-07-09 | Discharge: 2021-07-09 | Disposition: A | Discharge status: Home / Self Care | Payer: Commercial Managed Care - PPO | Source: Ambulatory Visit | Attending: NURSE PRACTITIONER | Admitting: NURSE PRACTITIONER

## 2021-07-09 ENCOUNTER — Other Ambulatory Visit (HOSPITAL_COMMUNITY): Payer: Self-pay | Admitting: NURSE PRACTITIONER

## 2021-07-09 ENCOUNTER — Other Ambulatory Visit: Payer: Self-pay

## 2021-07-09 DIAGNOSIS — L539 Erythematous condition, unspecified: Secondary | ICD-10-CM | POA: Insufficient documentation

## 2021-07-10 LAB — ECG 12 LEAD
Atrial Rate: 98 {beats}/min
Calculated P Axis: 69 degrees
Calculated R Axis: 17 degrees
Calculated T Axis: 73 degrees
PR Interval: 194 ms
QRS Duration: 100 ms
QT Interval: 404 ms
QTC Calculation: 515 ms
Ventricular rate: 98 {beats}/min

## 2021-07-23 LAB — ECG 12 LEAD
Atrial Rate: 79 {beats}/min
Calculated P Axis: 79 degrees
Calculated R Axis: 50 degrees
Calculated T Axis: 89 degrees
PR Interval: 214 ms
QRS Duration: 104 ms
QT Interval: 438 ms
QTC Calculation: 502 ms
Ventricular rate: 79 {beats}/min

## 2021-08-05 ENCOUNTER — Other Ambulatory Visit: Payer: Self-pay

## 2021-08-05 ENCOUNTER — Emergency Department (HOSPITAL_BASED_OUTPATIENT_CLINIC_OR_DEPARTMENT_OTHER): Payer: Commercial Managed Care - PPO

## 2021-08-05 ENCOUNTER — Encounter (HOSPITAL_BASED_OUTPATIENT_CLINIC_OR_DEPARTMENT_OTHER): Payer: Self-pay

## 2021-08-05 ENCOUNTER — Observation Stay
Admission: EM | Admit: 2021-08-05 | Discharge: 2021-08-06 | Disposition: A | Discharge status: Other Acute Inpatient Hospital | Payer: Commercial Managed Care - PPO | Attending: Internal Medicine | Admitting: Internal Medicine

## 2021-08-05 DIAGNOSIS — I252 Old myocardial infarction: Secondary | ICD-10-CM | POA: Insufficient documentation

## 2021-08-05 DIAGNOSIS — I10 Essential (primary) hypertension: Secondary | ICD-10-CM

## 2021-08-05 DIAGNOSIS — I5032 Chronic diastolic (congestive) heart failure: Secondary | ICD-10-CM | POA: Insufficient documentation

## 2021-08-05 DIAGNOSIS — I2510 Atherosclerotic heart disease of native coronary artery without angina pectoris: Secondary | ICD-10-CM | POA: Insufficient documentation

## 2021-08-05 DIAGNOSIS — D649 Anemia, unspecified: Secondary | ICD-10-CM

## 2021-08-05 DIAGNOSIS — Z79899 Other long term (current) drug therapy: Secondary | ICD-10-CM | POA: Insufficient documentation

## 2021-08-05 DIAGNOSIS — F17210 Nicotine dependence, cigarettes, uncomplicated: Secondary | ICD-10-CM | POA: Insufficient documentation

## 2021-08-05 DIAGNOSIS — Z7902 Long term (current) use of antithrombotics/antiplatelets: Secondary | ICD-10-CM | POA: Insufficient documentation

## 2021-08-05 DIAGNOSIS — I132 Hypertensive heart and chronic kidney disease with heart failure and with stage 5 chronic kidney disease, or end stage renal disease: Principal | ICD-10-CM | POA: Insufficient documentation

## 2021-08-05 DIAGNOSIS — I509 Heart failure, unspecified: Secondary | ICD-10-CM

## 2021-08-05 DIAGNOSIS — Z7982 Long term (current) use of aspirin: Secondary | ICD-10-CM | POA: Insufficient documentation

## 2021-08-05 DIAGNOSIS — J45909 Unspecified asthma, uncomplicated: Secondary | ICD-10-CM | POA: Insufficient documentation

## 2021-08-05 DIAGNOSIS — I517 Cardiomegaly: Secondary | ICD-10-CM | POA: Diagnosis present

## 2021-08-05 DIAGNOSIS — K219 Gastro-esophageal reflux disease without esophagitis: Secondary | ICD-10-CM | POA: Insufficient documentation

## 2021-08-05 DIAGNOSIS — R0600 Dyspnea, unspecified: Secondary | ICD-10-CM | POA: Diagnosis present

## 2021-08-05 DIAGNOSIS — R0602 Shortness of breath: Secondary | ICD-10-CM

## 2021-08-05 DIAGNOSIS — Z7951 Long term (current) use of inhaled steroids: Secondary | ICD-10-CM | POA: Insufficient documentation

## 2021-08-05 DIAGNOSIS — E8770 Fluid overload, unspecified: Secondary | ICD-10-CM

## 2021-08-05 DIAGNOSIS — N189 Chronic kidney disease, unspecified: Secondary | ICD-10-CM

## 2021-08-05 DIAGNOSIS — Z992 Dependence on renal dialysis: Secondary | ICD-10-CM | POA: Insufficient documentation

## 2021-08-05 DIAGNOSIS — Z792 Long term (current) use of antibiotics: Secondary | ICD-10-CM | POA: Insufficient documentation

## 2021-08-05 DIAGNOSIS — D631 Anemia in chronic kidney disease: Secondary | ICD-10-CM | POA: Insufficient documentation

## 2021-08-05 DIAGNOSIS — N186 End stage renal disease: Secondary | ICD-10-CM | POA: Insufficient documentation

## 2021-08-05 LAB — MANUAL DIFFERENTIAL
LYMPHOCYTE %: 12 % — ABNORMAL LOW (ref 25–45)
LYMPHOCYTE ABSOLUTE: 1.01 10*3/uL — ABNORMAL LOW (ref 1.10–5.00)
LYMPHOCYTES MANUAL: 12
MONOCYTE %: 10 % (ref 0–12)
MONOCYTE ABSOLUTE: 0.84 10*3/uL (ref 0.00–1.30)
MONOCYTES MANUAL: 10
NEUTROPHIL %: 78 % — ABNORMAL HIGH (ref 40–76)
NEUTROPHIL ABSOLUTE: 6.55 10*3/uL (ref 1.80–8.40)
NEUTROPHILS MANUAL: 78
PLATELET MORPHOLOGY COMMENT: NORMAL
TOTAL CELLS COUNTED [#] IN BLOOD: 100
WBC: 8.4 10*3/uL

## 2021-08-05 LAB — COVID-19, FLU A/B, RSV RAPID BY PCR
INFLUENZA VIRUS TYPE A: NOT DETECTED
INFLUENZA VIRUS TYPE B: NOT DETECTED
RESPIRATORY SYNCTIAL VIRUS (RSV): NOT DETECTED
SARS-CoV-2: NOT DETECTED

## 2021-08-05 LAB — IRON TRANSFERRIN AND TIBC
IRON (TRANSFERRIN) SATURATION: 13 % — ABNORMAL LOW (ref 15–50)
IRON: 33 ug/dL — ABNORMAL LOW (ref 50–212)
TOTAL IRON BINDING CAPACITY: 263 ug/dL (ref 250–450)
TRANSFERRIN: 188 mg/dL — ABNORMAL LOW (ref 203–362)
UIBC: 230 ug/dL (ref 130–375)

## 2021-08-05 LAB — CBC WITH DIFF
HCT: 28.4 % — ABNORMAL LOW (ref 37.0–47.0)
HGB: 9.2 g/dL — ABNORMAL LOW (ref 12.5–16.0)
MCH: 30.6 pg (ref 27.0–32.0)
MCHC: 32.5 g/dL (ref 32.0–36.0)
MCV: 94.5 fL (ref 78.0–99.0)
MPV: 7.6 fL (ref 7.4–10.4)
PLATELETS: 265 10*3/uL (ref 140–440)
RBC: 3.01 10*6/uL — ABNORMAL LOW (ref 4.20–5.40)
RDW: 19.2 % — ABNORMAL HIGH (ref 11.6–14.8)
WBC: 8.4 10*3/uL (ref 4.0–10.5)

## 2021-08-05 LAB — TROPONIN-I
TROPONIN I: 52 ng/L — ABNORMAL HIGH (ref <15)
TROPONIN I: 55 ng/L — ABNORMAL HIGH (ref <15)
TROPONIN I: 59 ng/L — ABNORMAL HIGH (ref <15)

## 2021-08-05 LAB — BASIC METABOLIC PANEL
ANION GAP: 12 mmol/L (ref 10–20)
ANION GAP: 10 mmol/L (ref 10–20)
BUN/CREA RATIO: 5
BUN/CREA RATIO: 4 — ABNORMAL LOW (ref 6–22)
BUN: 63 mg/dL — ABNORMAL HIGH (ref 7–18)
BUN: 29 mg/dL — ABNORMAL HIGH (ref 7–25)
CALCIUM: 10.1 mg/dL (ref 8.5–10.1)
CALCIUM: 10.2 mg/dL (ref 8.6–10.3)
CHLORIDE: 92 mmol/L — ABNORMAL LOW (ref 98–107)
CHLORIDE: 94 mmol/L — ABNORMAL LOW (ref 98–107)
CO2 TOTAL: 24 mmol/L (ref 21–32)
CO2 TOTAL: 28 mmol/L (ref 21–31)
CREATININE: 11.99 mg/dL — ABNORMAL HIGH (ref 0.55–1.02)
CREATININE: 7.3 mg/dL — ABNORMAL HIGH (ref 0.60–1.30)
ESTIMATED GFR: 4 mL/min/{1.73_m2} — ABNORMAL LOW (ref >59)
ESTIMATED GFR: 6 mL/min/{1.73_m2} — ABNORMAL LOW (ref >59)
GLUCOSE: 102 mg/dL (ref 74–106)
GLUCOSE: 124 mg/dL — ABNORMAL HIGH (ref 74–109)
OSMOLALITY, CALCULATED: 275 mOsm/kg (ref 270–290)
OSMOLALITY, CALCULATED: 272 mOsm/kg (ref 270–290)
POTASSIUM: 5.9 mmol/L — ABNORMAL HIGH (ref 3.5–5.1)
POTASSIUM: 3.9 mmol/L (ref 3.5–5.1)
SODIUM: 128 mmol/L — ABNORMAL LOW (ref 136–145)
SODIUM: 132 mmol/L — ABNORMAL LOW (ref 136–145)

## 2021-08-05 LAB — LACTIC ACID LEVEL W/ REFLEX FOR LEVEL >2.0: LACTIC ACID: 0.7 mmol/L (ref 0.4–2.0)

## 2021-08-05 LAB — C-REACTIVE PROTEIN (CRP): C-REACTIVE PROTEIN (CRP): 1.3 mg/dL — ABNORMAL HIGH (ref 0.1–0.5)

## 2021-08-05 LAB — PT/INR
INR: 1.19 — ABNORMAL HIGH (ref 0.88–1.10)
PROTHROMBIN TIME: 12.6 seconds (ref 9.8–12.7)

## 2021-08-05 LAB — NT-PROBNP: NT-PROBNP: 31785 pg/mL — ABNORMAL HIGH (ref <=125)

## 2021-08-05 LAB — FERRITIN: FERRITIN: 940 ng/mL — ABNORMAL HIGH (ref 11–336)

## 2021-08-05 MED ORDER — ISOSORBIDE MONONITRATE ER 60 MG TABLET,EXTENDED RELEASE 24 HR
60.0000 mg | ORAL_TABLET | Freq: Every morning | ORAL | Status: DC
Start: 2021-08-05 — End: 2021-08-06
  Administered 2021-08-05 – 2021-08-06 (×2): 60 mg via ORAL
  Filled 2021-08-05 (×2): qty 1

## 2021-08-05 MED ORDER — BUDESONIDE-FORMOTEROL HFA 160 MCG-4.5 MCG/ACTUATION AEROSOL INHALER
2.0000 | INHALATION_SPRAY | Freq: Two times a day (BID) | RESPIRATORY_TRACT | Status: DC
Start: 2021-08-05 — End: 2021-08-06
  Administered 2021-08-05: 0 via RESPIRATORY_TRACT
  Filled 2021-08-05: qty 12

## 2021-08-05 MED ORDER — IPRATROPIUM 0.5 MG-ALBUTEROL 3 MG (2.5 MG BASE)/3 ML NEBULIZATION SOLN
3.0000 mL | INHALATION_SOLUTION | RESPIRATORY_TRACT | Status: AC
Start: 2021-08-05 — End: 2021-08-05
  Administered 2021-08-05: 3 mL via RESPIRATORY_TRACT

## 2021-08-05 MED ORDER — MONTELUKAST 10 MG TABLET
10.0000 mg | ORAL_TABLET | Freq: Every evening | ORAL | Status: DC
Start: 2021-08-05 — End: 2021-08-06
  Administered 2021-08-05: 10 mg via ORAL
  Filled 2021-08-05: qty 1

## 2021-08-05 MED ORDER — IPRATROPIUM 0.5 MG-ALBUTEROL 3 MG (2.5 MG BASE)/3 ML NEBULIZATION SOLN
3.0000 mL | INHALATION_SOLUTION | Freq: Four times a day (QID) | RESPIRATORY_TRACT | Status: DC | PRN
Start: 2021-08-05 — End: 2021-08-06

## 2021-08-05 MED ORDER — DILTIAZEM 30 MG TABLET
120.0000 mg | ORAL_TABLET | Freq: Every day | ORAL | Status: DC
Start: 2021-08-05 — End: 2021-08-05

## 2021-08-05 MED ORDER — CLOPIDOGREL 75 MG TABLET
75.0000 mg | ORAL_TABLET | Freq: Every day | ORAL | Status: DC
Start: 2021-08-05 — End: 2021-08-06
  Administered 2021-08-05 – 2021-08-06 (×2): 75 mg via ORAL
  Filled 2021-08-05 (×2): qty 1

## 2021-08-05 MED ORDER — CYCLOBENZAPRINE 10 MG TABLET
5.0000 mg | ORAL_TABLET | Freq: Two times a day (BID) | ORAL | Status: DC | PRN
Start: 2021-08-05 — End: 2021-08-06
  Administered 2021-08-05: 5 mg via ORAL
  Filled 2021-08-05: qty 1

## 2021-08-05 MED ORDER — GUAIFENESIN ER 600 MG TABLET, EXTENDED RELEASE 12 HR
600.0000 mg | EXTENDED_RELEASE_TABLET | Freq: Two times a day (BID) | ORAL | Status: DC
Start: 2021-08-05 — End: 2021-08-06
  Administered 2021-08-05 – 2021-08-06 (×2): 600 mg via ORAL
  Filled 2021-08-05 (×2): qty 1

## 2021-08-05 MED ORDER — ACETAMINOPHEN 325 MG TABLET
650.0000 mg | ORAL_TABLET | ORAL | Status: AC
Start: 2021-08-05 — End: 2021-08-05
  Administered 2021-08-05: 650 mg via ORAL

## 2021-08-05 MED ORDER — IPRATROPIUM 0.5 MG-ALBUTEROL 3 MG (2.5 MG BASE)/3 ML NEBULIZATION SOLN
INHALATION_SOLUTION | RESPIRATORY_TRACT | Status: AC
Start: 2021-08-05 — End: 2021-08-05
  Filled 2021-08-05: qty 3

## 2021-08-05 MED ORDER — CARVEDILOL 25 MG TABLET
25.0000 mg | ORAL_TABLET | Freq: Two times a day (BID) | ORAL | Status: DC
Start: 2021-08-05 — End: 2021-08-06
  Administered 2021-08-05: 0 mg via ORAL
  Filled 2021-08-05: qty 1

## 2021-08-05 MED ORDER — ALBUTEROL SULFATE HFA 90 MCG/ACTUATION AEROSOL INHALER
2.0000 | INHALATION_SPRAY | Freq: Four times a day (QID) | RESPIRATORY_TRACT | Status: DC | PRN
Start: 2021-08-05 — End: 2021-08-06
  Filled 2021-08-05: qty 17

## 2021-08-05 MED ORDER — NITROGLYCERIN 0.4 MG SUBLINGUAL TABLET
0.4000 mg | SUBLINGUAL_TABLET | SUBLINGUAL | Status: DC | PRN
Start: 2021-08-05 — End: 2021-08-06

## 2021-08-05 MED ORDER — CLONIDINE 0.1 MG/24 HR WEEKLY TRANSDERMAL PATCH
0.3000 mg | MEDICATED_PATCH | TRANSDERMAL | Status: DC
Start: 2021-08-09 — End: 2021-08-06

## 2021-08-05 MED ORDER — SODIUM ZIRCONIUM CYCLOSILICATE 10 GRAM ORAL POWDER PACKET
ORAL | Status: AC
Start: 2021-08-05 — End: 2021-08-05
  Filled 2021-08-05: qty 1

## 2021-08-05 MED ORDER — CLONIDINE HCL 0.2 MG TABLET
0.2000 mg | ORAL_TABLET | ORAL | Status: AC
Start: 2021-08-05 — End: 2021-08-05
  Administered 2021-08-05: 0.2 mg via ORAL

## 2021-08-05 MED ORDER — PANTOPRAZOLE 40 MG TABLET,DELAYED RELEASE
40.0000 mg | DELAYED_RELEASE_TABLET | Freq: Every day | ORAL | Status: DC
Start: 2021-08-05 — End: 2021-08-06
  Administered 2021-08-05 – 2021-08-06 (×2): 40 mg via ORAL
  Filled 2021-08-05 (×2): qty 1

## 2021-08-05 MED ORDER — DILTIAZEM CD 120 MG CAPSULE,EXTENDED RELEASE 24 HR
120.0000 mg | ORAL_CAPSULE | Freq: Every day | ORAL | Status: DC
Start: 2021-08-05 — End: 2021-08-06
  Administered 2021-08-05 – 2021-08-06 (×2): 120 mg via ORAL
  Filled 2021-08-05 (×2): qty 1

## 2021-08-05 MED ORDER — SPIRONOLACTONE 25 MG TABLET
25.0000 mg | ORAL_TABLET | Freq: Two times a day (BID) | ORAL | Status: DC
Start: 2021-08-05 — End: 2021-08-05

## 2021-08-05 MED ORDER — FUROSEMIDE 10 MG/ML INJECTION SOLUTION
INTRAMUSCULAR | Status: AC
Start: 2021-08-05 — End: 2021-08-05
  Filled 2021-08-05: qty 10

## 2021-08-05 MED ORDER — SODIUM ZIRCONIUM CYCLOSILICATE 10 GRAM ORAL POWDER PACKET
10.0000 g | ORAL | Status: AC
Start: 2021-08-05 — End: 2021-08-05
  Administered 2021-08-05: 10 g via ORAL

## 2021-08-05 MED ORDER — DOXAZOSIN 4 MG TABLET
4.0000 mg | ORAL_TABLET | Freq: Every evening | ORAL | Status: DC
Start: 2021-08-05 — End: 2021-08-06
  Administered 2021-08-05: 4 mg via ORAL
  Filled 2021-08-05: qty 1

## 2021-08-05 MED ORDER — HEPARIN (PORCINE) 5,000 UNIT/ML INJECTION SOLUTION
5000.0000 [IU] | Freq: Three times a day (TID) | INTRAMUSCULAR | Status: DC
Start: 2021-08-05 — End: 2021-08-06
  Administered 2021-08-05 – 2021-08-06 (×2): 0 [IU] via SUBCUTANEOUS
  Filled 2021-08-05: qty 1

## 2021-08-05 MED ORDER — LOSARTAN 50 MG TABLET
50.0000 mg | ORAL_TABLET | Freq: Every day | ORAL | Status: DC
Start: 2021-08-05 — End: 2021-08-05

## 2021-08-05 MED ORDER — LOSARTAN 50 MG TABLET
50.0000 mg | ORAL_TABLET | Freq: Every day | ORAL | Status: DC
Start: 2021-08-06 — End: 2021-08-06
  Administered 2021-08-06: 50 mg via ORAL
  Filled 2021-08-05: qty 1

## 2021-08-05 MED ORDER — ASPIRIN 81 MG CHEWABLE TABLET
81.0000 mg | CHEWABLE_TABLET | Freq: Every day | ORAL | Status: DC
Start: 2021-08-05 — End: 2021-08-06
  Administered 2021-08-05 – 2021-08-06 (×2): 81 mg via ORAL
  Filled 2021-08-05 (×2): qty 1

## 2021-08-05 MED ORDER — ONDANSETRON 4 MG DISINTEGRATING TABLET
4.0000 mg | ORAL_TABLET | Freq: Two times a day (BID) | ORAL | Status: DC | PRN
Start: 2021-08-05 — End: 2021-08-06
  Administered 2021-08-05: 4 mg via ORAL
  Filled 2021-08-05 (×2): qty 1

## 2021-08-05 MED ORDER — HYDROXYZINE HCL 10 MG/5 ML ORAL SOLUTION
10.0000 mg | Freq: Four times a day (QID) | ORAL | Status: DC | PRN
Start: 2021-08-05 — End: 2021-08-06
  Administered 2021-08-05: 10 mg via ORAL
  Filled 2021-08-05: qty 5

## 2021-08-05 MED ORDER — DEXTROMETHORPHAN-GUAIFENESIN 10 MG-100 MG/5 ML ORAL SYRUP
10.0000 mL | ORAL_SOLUTION | ORAL | Status: DC | PRN
Start: 2021-08-05 — End: 2021-08-06
  Administered 2021-08-05 (×2): 10 mL via ORAL
  Filled 2021-08-05: qty 10

## 2021-08-05 MED ORDER — FUROSEMIDE 10 MG/ML INJECTION SOLUTION
80.0000 mg | INTRAMUSCULAR | Status: AC
Start: 2021-08-05 — End: 2021-08-05
  Administered 2021-08-05: 80 mg via INTRAVENOUS

## 2021-08-05 MED ORDER — ONDANSETRON HCL 4 MG TABLET
4.0000 mg | ORAL_TABLET | Freq: Two times a day (BID) | ORAL | Status: DC | PRN
Start: 2021-08-05 — End: 2021-08-05

## 2021-08-05 MED ORDER — CLONIDINE HCL 0.2 MG TABLET
ORAL_TABLET | ORAL | Status: AC
Start: 2021-08-05 — End: 2021-08-05
  Filled 2021-08-05: qty 1

## 2021-08-05 MED ORDER — GUAIFENESIN 100 MG/5 ML ORAL LIQUID
ORAL | Status: AC
Start: 2021-08-05 — End: 2021-08-05
  Filled 2021-08-05: qty 10

## 2021-08-05 MED ORDER — CINACALCET 30 MG TABLET
30.0000 mg | ORAL_TABLET | Freq: Every evening | ORAL | Status: DC
Start: 2021-08-05 — End: 2021-08-06
  Administered 2021-08-05: 30 mg via ORAL
  Filled 2021-08-05: qty 1

## 2021-08-05 MED ORDER — FUROSEMIDE 40 MG TABLET
80.0000 mg | ORAL_TABLET | Freq: Two times a day (BID) | ORAL | Status: DC
Start: 2021-08-05 — End: 2021-08-06
  Administered 2021-08-05 – 2021-08-06 (×2): 80 mg via ORAL
  Filled 2021-08-05 (×2): qty 2

## 2021-08-05 MED ORDER — ACETAMINOPHEN 325 MG TABLET
650.0000 mg | ORAL_TABLET | ORAL | Status: DC | PRN
Start: 2021-08-05 — End: 2021-08-06

## 2021-08-05 MED ORDER — LABETALOL 200 MG TABLET
200.0000 mg | ORAL_TABLET | Freq: Two times a day (BID) | ORAL | Status: DC
Start: 2021-08-05 — End: 2021-08-06
  Administered 2021-08-05 – 2021-08-06 (×2): 200 mg via ORAL
  Filled 2021-08-05 (×2): qty 1

## 2021-08-05 MED ORDER — LORATADINE 10 MG TABLET
10.0000 mg | ORAL_TABLET | Freq: Every day | ORAL | Status: DC
Start: 2021-08-05 — End: 2021-08-06
  Administered 2021-08-05 – 2021-08-06 (×2): 10 mg via ORAL
  Filled 2021-08-05 (×2): qty 1

## 2021-08-05 MED ORDER — ACETAMINOPHEN 325 MG TABLET
ORAL_TABLET | ORAL | Status: AC
Start: 2021-08-05 — End: 2021-08-05
  Filled 2021-08-05: qty 2

## 2021-08-05 NOTE — ED Nurses Note (Addendum)
Pt awakens to voice. No c/o's. Pt transported to [facility] main campus. Room [address] [facility]

## 2021-08-05 NOTE — ED Nurses Note (Signed)
Pt resting with eyes closed. No distress noted.

## 2021-08-05 NOTE — H&P (Signed)
[place]    HOSPITALIST H&P    Yuliya Scheurich 46 y.o. female 312/B   Date of Service: 08/05/2021    Date of Admission:  08/05/2021   PCP: Cam Hai, FNP Code Status:Full Code       Chief Complaint: Shortness of breath and swelling.    HPI:   Patient is a 46 year old female past medical history of end-stage renal disease on hemodialysis Monday Wednesday Friday, chronic CHF, chronic atypical chest wall pain.  Patient states she had a nonproductive cough for the past 3 days with body aches and chills.  Patient was dialyzed on Friday and is  due for dialysis today.  Patient was breathing comfortably at the time of my examination.  She was afebrile and oxygen saturation was 95% on 2L.  The patient had just started her dialysis treatment.  I updated her on her lab findings and imaging.  The patient states they have not been removing as much fluid as they use to in her outpatient dialysis.  States she has noticed increased swelling since.      ED medications:   Medications Administered in the ED   dextromethorphan-guaiFENesin (ROBITUSSIN DM) 10-100mg  per 34mL oral liquid (10 mL Oral Given 08/05/21 0258)   ipratropium-albuterol 0.5 mg-3 mg(2.5 mg base)/3 mL Solution for Nebulization (3 mL Nebulization Given 08/05/21 0255)   cloNIDine (CATAPRES) tablet (0.2 mg Oral Given 08/05/21 0258)   acetaminophen (TYLENOL) tablet (650 mg Oral Given 08/05/21 0258)   furosemide (LASIX) 10 mg/mL injection (80 mg Intravenous Given 08/05/21 0340)   sodium zirconium cyclosilicate (LOKELMA) powder (has no administration in time range)         PMHx:    Past Medical History:   Diagnosis Date   . Asthma    . Chronic diastolic CHF (congestive heart failure) (CMS HCC)    . Esophageal reflux    . ESRD (end stage renal disease) (CMS [place])    . History of anemia due to CKD    . Hypertension    . MI (myocardial infarction) (CMS [place])    . Mitral valve regurgitation    . Pulmonary edema    . Sleep apnea    . Tricuspid valve  regurgitation         PSHx:   Past Surgical History:   Procedure Laterality Date   . ESOPHAGOGASTRODUODENOSCOPY     . HX BACK SURGERY     . HX CHOLECYSTECTOMY     . HX FOOT SURGERY Right    . HX HYSTERECTOMY     . HX TONSILLECTOMY            Allergies:    Allergies   Allergen Reactions   . Lisinopril Swelling     Tongue and throat swelling    Social History  Social History     Tobacco Use   . Smoking status: Every Day     Packs/day: 0.50     Types: Cigarettes   . Smokeless tobacco: Never   Vaping Use   . Vaping Use: Never used   Substance Use Topics   . Alcohol use: Not Currently   . Drug use: Yes     Types: Marijuana       Family History  Family Medical History:     Problem Relation (Age of Onset)    Breast Cancer Mother    Coronary Artery Disease Mother    Diabetes type II Mother    Hypertension (High Blood Pressure)  Father             Home Meds:      Prior to Admission medications    Medication Sig Start Date End Date Taking? Authorizing Provider   albuterol sulfate (PROVENTIL OR VENTOLIN OR PROAIR) 90 mcg/actuation Inhalation oral inhaler Take 1-2 Puffs by inhalation Every 6 hours as needed    Provider, Historical   aspirin (ECOTRIN) 81 mg Oral Tablet, Delayed Release (E.C.) Take 1 Tablet (81 mg total) by mouth Once a day    Provider, Historical   cloNIDine HCL (CATAPRES) 0.1 mg Oral Tablet Take 1 Tablet (0.1 mg total) by mouth in the morning and 1 Tablet (0.1 mg total) at noon and 1 Tablet (0.1 mg total) before bedtime.    Provider, Historical   dilTIAZem HCL (CARDIZEM) 120 mg Oral Tablet Take 1 Tablet (120 mg total) by mouth in the morning and 1 Tablet (120 mg total) at noon and 1 Tablet (120 mg total) before bedtime.    Provider, Historical   doxazosin (CARDURA) 4 mg Oral Tablet Take 1 Tablet (4 mg total) by mouth Every evening    Provider, Historical   furosemide (LASIX) 80 mg Oral Tablet Take 1 Tablet (80 mg total) by mouth in the morning and 1 Tablet (80 mg total) in the evening.    Provider, Historical    hydrOXYzine pamoate (VISTARIL) 25 mg Oral Capsule Take 1 Capsule (25 mg total) by mouth Every night as needed for Itching    Provider, Historical   isosorbide mononitrate (IMDUR) 60 mg Oral Tablet Sustained Release 24 hr Take 1 Tablet (60 mg total) by mouth Every morning    Provider, Historical   labetaloL (NORMODYNE) 200 mg Oral Tablet Take 1 Tablet (200 mg total) by mouth in the morning and 1 Tablet (200 mg total) at noon and 1 Tablet (200 mg total) before bedtime. 1.5 tabs.    Provider, Historical   loratadine (CLARITIN) 10 mg Oral Tablet Take 1 Tablet (10 mg total) by mouth Once a day    Provider, Historical   losartan (COZAAR) 50 mg Oral Tablet Take 1 Tablet (50 mg total) by mouth Once a day    Provider, Historical   montelukast (SINGULAIR) 10 mg Oral Tablet Take 1 Tablet (10 mg total) by mouth Every evening    Provider, Historical   nitroGLYCERIN (NITROSTAT) 0.4 mg Sublingual Tablet, Sublingual Place 1 Tablet (0.4 mg total) under the tongue Every 5 minutes for 3 doses for 3 doses over 15 minutes 06/23/21 06/24/21  Elder, Gildardo Cranker, MD   oxyCODONE-acetaminophen (PERCOCET) 5-325 mg Oral Tablet Take 1 Tablet by mouth Every 4 hours as needed 12/13/20   Murugu-Knopf, Roselyne, MD   pantoprazole (PROTONIX) 40 mg Oral Tablet, Delayed Release (E.C.) Take 1 Tablet (40 mg total) by mouth Once a day    Provider, Historical   polyethylene glycol (MIRALAX) 17 gram Oral Powder in Packet Take 1 Packet (17 g total) by mouth Once a day 12/14/20   Murugu-Knopf, Roselyne, MD   sennosides-docusate sodium (SENOKOT-S) 8.6-50 mg Oral Tablet Take 1 Tablet by mouth Twice daily 12/13/20   Murugu-Knopf, Roselyne, MD   spironolactone (ALDACTONE) 25 mg Oral Tablet Take 1 Tablet (25 mg total) by mouth Once a day    Provider, Historical          ROS:   General: No fever or chills. No weight changes, fatigue, weakness.   HEENT: No headaches, dizziness, changes in vision, changes in hearing, or difficulty swallowing.  Skin:  No rashes, erythema  or bruises.   Cardiac: No chest pain, palpitations, or arrhythmia.    Respiratory: Diminished breath sounds.  No wheezing.  GI: No nausea or vomiting. No abdominal pain.   Urinary: No dysuria, hematuria, or change in frequency.    Vascular: bilateral lower extremity edema  Musculoskeletal: No muscle weakness, pain, or decreased range of motion.   Neurologic: No loss of sensation, numbness or tingling.   Endocrine: No heat or cold intolerance or polydipsia.   Psychiatric: No insomnia, depression or anxiety.      Results for orders placed or performed during the hospital encounter of 08/05/21 (from the past 24 hour(s))   NT-PROBNP   Result Value Ref Range    NT-PROBNP 31,785 (H) <=125 pg/mL   BASIC METABOLIC PANEL   Result Value Ref Range    SODIUM 128 (L) 136 - 145 mmol/L    POTASSIUM 5.9 (H) 3.5 - 5.1 mmol/L    CHLORIDE 92 (L) 98 - 107 mmol/L    CO2 TOTAL 24 21 - 32 mmol/L    ANION GAP 12 10 - 20 mmol/L    CALCIUM 10.1 8.5 - 10.1 mg/dL    GLUCOSE 102 74 - 106 mg/dL    BUN 63 (H) 7 - 18 mg/dL    CREATININE 11.99 (H) 0.55 - 1.02 mg/dL    BUN/CREA RATIO 5     ESTIMATED GFR 4 (L) >59 mL/min/1.19m^2    OSMOLALITY, CALCULATED 275 270 - 290 mOsm/kg   LACTIC ACID LEVEL W/ REFLEX FOR LEVEL >2.0   Result Value Ref Range    LACTIC ACID 0.7 0.4 - 2.0 mmol/L   PT/INR   Result Value Ref Range    PROTHROMBIN TIME 12.6 9.8 - 12.7 seconds    INR 1.19 (H) 0.88 - 1.10   TROPONIN-I (Q3H X 3)   Result Value Ref Range    TROPONIN I 52 (H) <15 ng/L   COVID-19, FLU A/B, RSV RAPID BY PCR   Result Value Ref Range    SARS-CoV-2 Not Detected Not Detected    INFLUENZA VIRUS TYPE A Not Detected Not Detected    INFLUENZA VIRUS TYPE B Not Detected Not Detected    RESPIRATORY SYNCTIAL VIRUS (RSV) Not Detected Not Detected   CBC WITH DIFF   Result Value Ref Range    WBCS UNCORRECTED      WBC 8.4 4.0 - 10.5 x10^3/uL    RBC 3.01 (L) 4.20 - 5.40 x10^6/uL    HGB 9.2 (L) 12.5 - 16.0 g/dL    HCT 28.4 (L) 37.0 - 47.0 %    MCV 94.5 78.0 - 99.0 fL    MCH  30.6 27.0 - 32.0 pg    MCHC 32.5 32.0 - 36.0 g/dL    RDW 19.2 (H) 11.6 - 14.8 %    PLATELETS 265 140 - 440 x10^3/uL    MPV 7.6 7.4 - 10.4 fL   MANUAL DIFFERENTIAL   Result Value Ref Range    WBC 8.4 x10^3/uL    NEUTROPHIL % 78 (H) 40 - 76 %    LYMPHOCYTE % 12 (L) 25 - 45 %    MONOCYTE % 10 0 - 12 %    EOSINOPHIL %      BASOPHIL %      METAMYELOCYTE %      MYELOCYTE %      PROMYELOCYTE %      BAND %      BLAST %  OTHER %      NEUTROPHIL ABSOLUTE 6.55 1.80 - 8.40 x10^3/uL    LYMPHOCYTE ABSOLUTE 1.01 (L) 1.10 - 5.00 x10^3/uL    MONOCYTE ABSOLUTE 0.84 0.00 - 1.30 x10^3/uL    EOSINOPHIL ABSOLUTE      BASOPHIL ABSOLUTE      METAMYELOCYTE ABSOLUTE      MYELOCYTE ABSOLUTE      PROMYELOCYTE ABSOLUTE      BLAST ABSOLUTE      OTHER CELL ABSOLUTE      ANISOCYTOSIS Occasional     POLYCHROMASIA      POIKILOCYTOSIS      BASOPHILIC STIPPLING      MICROCYTOSIS      MACROCYTOSIS      ROULEAUX      SCHISTOCYTES      SPHEROCYTES      TARGET CELLS      TEARDROP CELLS      OVALOCYTE (ELLIPTOCYTE)      CRENATED RED CELLS      STOMATOCYTES      ACANTHOCYTES (SPUR CELL)      ECHINOCYTE (BURR CELL)      BLISTER CELLS      RBC AGGLUTINATES      HOWELL JOLLY BODIES      ATYPICAL LYMPHOCYTES      TOXIC GRANULATION      DOHLE BODIES      TOXIC VACUOLIZATION      AUER RODS      BASKET CELLS      HYPERSEGMENTATION      LARGE PLATELETS      PLATELET CLUMPS      WBC MORPHOLOGY COMMENT      RBC MORPHOLOGY COMMENT      PLATELET MORPHOLOGY COMMENT Normal     BANDS NEUTROPHILS MANUAL      BAND ABSOLUTE      NEUTROPHILS MANUAL 78     LYMPHOCYTES MANUAL 12     MONOCYTES MANUAL 10     EOSINOPHILS MANUAL      BASOPHILS MANUAL      PROMYELOCYTES MANUAL      MYELOCYTES MANUAL      METAMYELOCYTES MANUAL      BLASTS MANUAL      TOTAL CELLS COUNTED [#] IN BLOOD 100     OTHER CELLS MANUAL      NUCLEATED RBC MANUAL      PLASMA CELL %      PLASMA CELL ABSOLUE      PLASMA CELLS MANUAL      HYPOCHROMASIA     TROPONIN-I (Q3H X 3)   Result Value Ref Range     TROPONIN I 55 (H) <15 ng/L   TROPONIN-I (Q3H X 3)   Result Value Ref Range    TROPONIN I 59 (H) <15 ng/L   C-REACTIVE PROTEIN (CRP)   Result Value Ref Range    C-REACTIVE PROTEIN (CRP) 1.3 (H) 0.1 - 0.5 mg/dL   FERRITIN   Result Value Ref Range    FERRITIN 940 (H) 11 - 336 ng/mL   IRON TRANSFERRIN AND TIBC   Result Value Ref Range    TOTAL IRON BINDING CAPACITY 263 250 - 450 ug/dL    IRON (TRANSFERRIN) SATURATION 13 (L) 15 - 50 %    IRON 33 (L) 50 - 212 ug/dL    TRANSFERRIN 188 (L) 203 - 362 mg/dL    UIBC 230 130 - 375 ug/dL   BASIC METABOLIC PANEL   Result Value Ref Range    SODIUM 132 (  L) 136 - 145 mmol/L    POTASSIUM 3.9 3.5 - 5.1 mmol/L    CHLORIDE 94 (L) 98 - 107 mmol/L    CO2 TOTAL 28 21 - 31 mmol/L    ANION GAP 10 10 - 20 mmol/L    CALCIUM 10.2 8.6 - 10.3 mg/dL    GLUCOSE 124 (H) 74 - 109 mg/dL    BUN 29 (H) 7 - 25 mg/dL    CREATININE 7.30 (H) 0.60 - 1.30 mg/dL    BUN/CREA RATIO 4 (L) 6 - 22    ESTIMATED GFR 6 (L) >59 mL/min/1.1m^2    OSMOLALITY, CALCULATED 272 270 - 290 mOsm/kg          Physical:  Filed Vitals:    08/05/21 0800 08/05/21 0858 08/05/21 1700 08/05/21 1859   BP: (!) 196/110 (!) 182/90 (!) 173/108 (!) 156/102   Pulse: 74 73 (!) 106 80   Resp: 16 18 16 20    Temp:  36.5 C (97.7 F)  37.1 C (98.7 F)   SpO2: 98% 93% 97% 91%      General: Patient is alert and oriented to person, place, and time. No acute distress. Communicates appropriately.   Head: Normocephalic and atraumatic.    Neck: Supple. No cervical lymphadenopathy or supraclavicular nodes detected. Trachea midline   Heart: Regular rate and rhythm. S1 & S2 present. No S3 or S4. No rubs, gallops, or murmurs appreciated.  Radial and dorsalis pedis pulses +2/4 bilaterally.  Brisk capillary refill.    Lungs: Diminished. No wheezing or ronchi   Abdomen: Soft, nontender, nondistended belly. Bowel sounds are present in all four quadrants. No rigidity.  No guarding.  No ascites.  Obese  Extremities: bilateral lower ext. Edema.  No cyanosis, or  clubbing. Grossly moves all extremities.    Skin: Warm and dry without lesions. No ecchymosis noted.    Neurologic: Cranial nerves II through XII are grossly intact. Sensation to light touch is intact. Strength 5/5 in upper extremities and lower extremities bilaterally.    Genitourinary:  No urinary incontinence or Foley catheter   Psychiatric: Mood and affect are appropriate for the situation.       Diagnostic studies:  No results found.       Assessments:  Active Hospital Problems   (*Primary Problem)    Diagnosis   . *Dyspnea   . Anemia in chronic kidney disease   . Hypervolemia associated with renal insufficiency   . LVH (left ventricular hypertrophy)   . Coronary artery disease involving native coronary artery   . ESRD (end stage renal disease) on dialysis (CMS HCC)   . Uncontrolled hypertension       Patient will be placed in observation. She will be dialyzed per nephrology's recommendation.  We will restart her home medications for her blood pressure, CAD, and asthma.  Repeat labs in A.M.     Will likely be discharged in A.M.       Code status: Full Code  DVT prophylaxis: heparin    Diet: DIET CARDIAC (2G NA, LOWFAT, LOW CHOL) Do you want to initiate MNT Protocol? Yes    Disposition:  The patient is currently acutely ill requiring treatment on the medical floor for hypervolemia secondary ESRD on HD. Patient will be closely evaluated monitored.   Estimated length of stay less  than 48 hr to obtain full medical treatment.      Vale Haven, DO    [facility] MEDICINE HOSPITALIST

## 2021-08-05 NOTE — ED Attending Note (Addendum)
[facility] emergency department         HISTORY OF PRESENT ILLNESS     Date:  08/05/2021  Patient's Name:  [name]  Date of Birth:  1975-09-03    Patient is a 46 year old female past medical history of end-stage renal disease on hemodialysis Monday Wednesday Friday chronic CHF chronic atypical chest wall pain.  Patient states she is had a cough nonproductive for the past 3 days with body aches and chills.  Patient was dialyzed on Friday due for dialysis today she denies any vomiting diarrhea.          Review of Systems     Review of Systems   Constitutional: Positive for chills and fever.   HENT: Negative.    Eyes: Negative.    Respiratory: Positive for cough and shortness of breath.    Musculoskeletal: Negative.    Neurological: Negative.    All other systems reviewed and are negative.      Previous History     Past Medical History:  Past Medical History:   Diagnosis Date   . Asthma    . Chronic diastolic CHF (congestive heart failure) (CMS HCC)    . Esophageal reflux    . ESRD (end stage renal disease) (CMS [place])    . History of anemia due to CKD    . Hypertension    . MI (myocardial infarction) (CMS [place])    . Mitral valve regurgitation    . Pulmonary edema    . Sleep apnea    . Tricuspid valve regurgitation        Past Surgical History:  Past Surgical History:   Procedure Laterality Date   . Esophagogastroduodenoscopy     . Hx back surgery     . Hx cholecystectomy     . Hx hysterectomy     . Hx tonsillectomy         Social History:  Social History     Tobacco Use   . Smoking status: Every Day     Packs/day: 0.50     Types: Cigarettes   . Smokeless tobacco: Never   Vaping Use   . Vaping Use: Never used   Substance Use Topics   . Alcohol use: Not Currently   . Drug use: Yes     Types: Marijuana     Social History     Substance and Sexual Activity   Drug Use Yes   . Types: Marijuana       Family History:  Family History   Problem Relation Age of Onset   . Diabetes type  II Mother    . Coronary Artery Disease Mother    . Breast Cancer Mother    . Hypertension (High Blood Pressure) Father        Medication History:  Current Outpatient Medications   Medication Sig   . albuterol sulfate (PROVENTIL OR VENTOLIN OR PROAIR) 90 mcg/actuation Inhalation oral inhaler Take 1-2 Puffs by inhalation Every 6 hours as needed   . aspirin (ECOTRIN) 81 mg Oral Tablet, Delayed Release (E.C.) Take 1 Tablet (81 mg total) by mouth Once a day   . cloNIDine HCL (CATAPRES) 0.1 mg Oral Tablet Take 1 Tablet (0.1 mg total) by mouth in the morning and 1 Tablet (0.1 mg total) at noon and 1 Tablet (0.1 mg total) before bedtime.   . dilTIAZem HCL (CARDIZEM) 120 mg Oral Tablet Take 1 Tablet (120 mg total) by mouth in the  morning and 1 Tablet (120 mg total) at noon and 1 Tablet (120 mg total) before bedtime.   Marland Kitchen doxazosin (CARDURA) 4 mg Oral Tablet Take 1 Tablet (4 mg total) by mouth Every evening   . furosemide (LASIX) 80 mg Oral Tablet Take 1 Tablet (80 mg total) by mouth in the morning and 1 Tablet (80 mg total) in the evening.   . hydrOXYzine pamoate (VISTARIL) 25 mg Oral Capsule Take 1 Capsule (25 mg total) by mouth Every night as needed for Itching   . isosorbide mononitrate (IMDUR) 60 mg Oral Tablet Sustained Release 24 hr Take 1 Tablet (60 mg total) by mouth Every morning   . labetaloL (NORMODYNE) 200 mg Oral Tablet Take 1 Tablet (200 mg total) by mouth in the morning and 1 Tablet (200 mg total) at noon and 1 Tablet (200 mg total) before bedtime. 1.5 tabs.   Marland Kitchen loratadine (CLARITIN) 10 mg Oral Tablet Take 1 Tablet (10 mg total) by mouth Once a day   . losartan (COZAAR) 50 mg Oral Tablet Take 1 Tablet (50 mg total) by mouth Once a day   . montelukast (SINGULAIR) 10 mg Oral Tablet Take 1 Tablet (10 mg total) by mouth Every evening   . nitroGLYCERIN (NITROSTAT) 0.4 mg Sublingual Tablet, Sublingual Place 1 Tablet (0.4 mg total) under the tongue Every 5 minutes for 3 doses for 3 doses over 15 minutes   .  oxyCODONE-acetaminophen (PERCOCET) 5-325 mg Oral Tablet Take 1 Tablet by mouth Every 4 hours as needed   . pantoprazole (PROTONIX) 40 mg Oral Tablet, Delayed Release (E.C.) Take 1 Tablet (40 mg total) by mouth Once a day   . polyethylene glycol (MIRALAX) 17 gram Oral Powder in Packet Take 1 Packet (17 g total) by mouth Once a day   . sennosides-docusate sodium (SENOKOT-S) 8.6-50 mg Oral Tablet Take 1 Tablet by mouth Twice daily   . spironolactone (ALDACTONE) 25 mg Oral Tablet Take 1 Tablet (25 mg total) by mouth Once a day       Allergies:  Allergies   Allergen Reactions   . Hydrocodone      Other reaction(s): Unknown   . Oxycodone      Other reaction(s): Unknown   . Lisinopril Swelling       Physical Exam     Vitals:    BP (!) 170/112   Pulse 90   Temp 37.4 C (99.4 F)   Resp 18   Ht 1.702 m (5\' 7" )   Wt 83.5 kg (184 lb)   SpO2 95%   BMI 28.82 kg/m           Physical Exam  Vitals and nursing note reviewed.   Constitutional:       General: She is not in acute distress.     Appearance: Normal appearance. She is well-developed and normal weight.   HENT:      Head: Normocephalic and atraumatic.      Mouth/Throat:      Mouth: Mucous membranes are dry.   Eyes:      Conjunctiva/sclera: Conjunctivae normal.   Cardiovascular:      Rate and Rhythm: Normal rate and regular rhythm.      Heart sounds: No murmur heard.  Pulmonary:      Effort: Pulmonary effort is normal. No respiratory distress.      Breath sounds: Rhonchi present.      Comments: Right subclavian dialysis catheter in place  Abdominal:      General: Bowel  sounds are normal.      Palpations: Abdomen is soft.      Tenderness: There is no abdominal tenderness.   Musculoskeletal:         General: No swelling. Normal range of motion.      Cervical back: Normal range of motion and neck supple.      Comments: Left upper arm shunt in place   Skin:     General: Skin is warm and dry.      Capillary Refill: Capillary refill takes less than 2 seconds.    Neurological:      Mental Status: She is alert and oriented to person, place, and time.   Psychiatric:         Mood and Affect: Mood normal.         Diagnostic Studies/Treatment     Medications:  Medications Administered in the ED   dextromethorphan-guaiFENesin (ROBITUSSIN DM) 10-100mg  per 70mL oral liquid (10 mL Oral Given 08/05/21 0258)   ipratropium-albuterol 0.5 mg-3 mg(2.5 mg base)/3 mL Solution for Nebulization (3 mL Nebulization Given 08/05/21 0255)   cloNIDine (CATAPRES) tablet (0.2 mg Oral Given 08/05/21 0258)   acetaminophen (TYLENOL) tablet (650 mg Oral Given 08/05/21 0258)   furosemide (LASIX) 10 mg/mL injection (80 mg Intravenous Given 08/05/21 0340)   sodium zirconium cyclosilicate (LOKELMA) powder (has no administration in time range)       New Prescriptions    No medications on file       Labs:    Results for orders placed or performed during the hospital encounter of 08/05/21 (from the past 12 hour(s))   NT-PROBNP   Result Value Ref Range    NT-PROBNP 31,785 (H) <=125 pg/mL   BASIC METABOLIC PANEL   Result Value Ref Range    SODIUM 128 (L) 136 - 145 mmol/L    POTASSIUM 5.9 (H) 3.5 - 5.1 mmol/L    CHLORIDE 92 (L) 98 - 107 mmol/L    CO2 TOTAL 24 21 - 32 mmol/L    ANION GAP 12 10 - 20 mmol/L    CALCIUM 10.1 8.5 - 10.1 mg/dL    GLUCOSE 102 74 - 106 mg/dL    BUN 63 (H) 7 - 18 mg/dL    CREATININE 11.99 (H) 0.55 - 1.02 mg/dL    BUN/CREA RATIO 5     ESTIMATED GFR 4 (L) >59 mL/min/1.17m^2    OSMOLALITY, CALCULATED 275 270 - 290 mOsm/kg   LACTIC ACID LEVEL W/ REFLEX FOR LEVEL >2.0   Result Value Ref Range    LACTIC ACID 0.7 0.4 - 2.0 mmol/L   PT/INR   Result Value Ref Range    PROTHROMBIN TIME 12.6 9.8 - 12.7 seconds    INR 1.19 (H) 0.88 - 1.10   TROPONIN-I (Q3H X 3)   Result Value Ref Range    TROPONIN I 52 (H) <15 ng/L   COVID-19, FLU A/B, RSV RAPID BY PCR   Result Value Ref Range    SARS-CoV-2 Not Detected Not Detected    INFLUENZA VIRUS TYPE A Not Detected Not Detected    INFLUENZA VIRUS TYPE B Not Detected  Not Detected    RESPIRATORY SYNCTIAL VIRUS (RSV) Not Detected Not Detected   CBC WITH DIFF   Result Value Ref Range    WBCS UNCORRECTED      WBC 8.4 4.0 - 10.5 x10^3/uL    RBC 3.01 (L) 4.20 - 5.40 x10^6/uL    HGB 9.2 (L) 12.5 - 16.0 g/dL  HCT 28.4 (L) 37.0 - 47.0 %    MCV 94.5 78.0 - 99.0 fL    MCH 30.6 27.0 - 32.0 pg    MCHC 32.5 32.0 - 36.0 g/dL    RDW 19.2 (H) 11.6 - 14.8 %    PLATELETS 265 140 - 440 x10^3/uL    MPV 7.6 7.4 - 10.4 fL   MANUAL DIFFERENTIAL   Result Value Ref Range    WBC 8.4 x10^3/uL    NEUTROPHIL % 78 (H) 40 - 76 %    LYMPHOCYTE % 12 (L) 25 - 45 %    MONOCYTE % 10 0 - 12 %    EOSINOPHIL %      BASOPHIL %      METAMYELOCYTE %      MYELOCYTE %      PROMYELOCYTE %      BAND %      BLAST %      OTHER %      NEUTROPHIL ABSOLUTE 6.55 1.80 - 8.40 x10^3/uL    LYMPHOCYTE ABSOLUTE 1.01 (L) 1.10 - 5.00 x10^3/uL    MONOCYTE ABSOLUTE 0.84 0.00 - 1.30 x10^3/uL    EOSINOPHIL ABSOLUTE      BASOPHIL ABSOLUTE      METAMYELOCYTE ABSOLUTE      MYELOCYTE ABSOLUTE      PROMYELOCYTE ABSOLUTE      BLAST ABSOLUTE      OTHER CELL ABSOLUTE      ANISOCYTOSIS Occasional     POLYCHROMASIA      POIKILOCYTOSIS      BASOPHILIC STIPPLING      MICROCYTOSIS      MACROCYTOSIS      ROULEAUX      SCHISTOCYTES      SPHEROCYTES      TARGET CELLS      TEARDROP CELLS      OVALOCYTE (ELLIPTOCYTE)      CRENATED RED CELLS      STOMATOCYTES      ACANTHOCYTES (SPUR CELL)      ECHINOCYTE (BURR CELL)      BLISTER CELLS      RBC AGGLUTINATES      HOWELL JOLLY BODIES      ATYPICAL LYMPHOCYTES      TOXIC GRANULATION      DOHLE BODIES      TOXIC VACUOLIZATION      AUER RODS      BASKET CELLS      HYPERSEGMENTATION      LARGE PLATELETS      PLATELET CLUMPS      WBC MORPHOLOGY COMMENT      RBC MORPHOLOGY COMMENT      PLATELET MORPHOLOGY COMMENT Normal     BANDS NEUTROPHILS MANUAL      BAND ABSOLUTE      NEUTROPHILS MANUAL 78     LYMPHOCYTES MANUAL 12     MONOCYTES MANUAL 10     EOSINOPHILS MANUAL      BASOPHILS MANUAL      PROMYELOCYTES MANUAL       MYELOCYTES MANUAL      METAMYELOCYTES MANUAL      BLASTS MANUAL      TOTAL CELLS COUNTED [#] IN BLOOD 100     OTHER CELLS MANUAL      NUCLEATED RBC MANUAL      PLASMA CELL %      PLASMA CELL ABSOLUE      PLASMA CELLS MANUAL      HYPOCHROMASIA  Radiology:  XR CHEST AP    XR CHEST AP   Final Result   FINDINGS ARE SIMILAR TO 07/08/2021 AND COULD BE SOME MILD PULMONARY VASCULAR CONGESTION         Radiologist location ID: [id number]             ECG:   Normal sinus rhythm 98 beats per minute PR interval 194 nonspecific ST-T changes            Differential diagnosis    Pneumonia, congestive heart failure, end-stage renal disease, viral syndrome  Course/Disposition/Plan     Course:     ED Course as of 08/05/21 0620   Mon Aug 05, 2021   0324 XR CHEST AP   No pneumonia no infiltrates    Patient accepted for transfer to [facility]  Chest x-ray CHF changes  Disposition:    Admitted    Condition at Disposition:    Stable    Follow up:   No follow-up provider specified.    Clinical Impression:     Clinical Impression   Shortness of breath (Primary)   Congestive heart failure, unspecified HF chronicity, unspecified heart failure type (CMS HCC)   Chronic renal failure, unspecified CKD stage         Winfred Burn, MD

## 2021-08-05 NOTE — ED Triage Notes (Signed)
Pt c/o shortness of breath, persistent cough x 3 days.

## 2021-08-05 NOTE — ED Nurses Note (Signed)
[facility] contacted to transport patient to main campus at this time

## 2021-08-05 NOTE — ED Nurses Note (Signed)
Report called to [facility] RN.  Patient resting comfortably in room with eyes closed.  Denies complaints at this time.  VSS.  Awaiting EMS transport.

## 2021-08-05 NOTE — Consults (Signed)
[facility]   Nephrology Consult      [place], [place], 46 y.o. female  Encounter Start Date:  08/05/2021  Inpatient Admission Date:    Date of Birth:  [birth year]    Admitting Provider:  Hospitalist service    Reason for Consult:  End-stage renal disease on hemodialysis    HPI:  This is a 46 year old black female patient with end-stage renal disease hemodialysis Monday Wednesday Friday frequent admissions with multiple aches and pains and date she came this time with shortness of breadth cough nonproductive chest x-ray showed vascular congestion today is her dialysis day will dialyze her today with ultrafiltration 3-4 L continue dialysis as scheduled Monday Wednesday Friday evaluate her for need for dialysis on a daily basis anemia of chronic kidney disease hemoglobin 9.2 will check PTH anemia profile keep blood pressure controlled    Past Medical History:   Diagnosis Date   . Asthma    . Chronic diastolic CHF (congestive heart failure) (CMS HCC)    . Esophageal reflux    . ESRD (end stage renal disease) (CMS [place])    . History of anemia due to CKD    . Hypertension    . MI (myocardial infarction) (CMS [place])    . Mitral valve regurgitation    . Pulmonary edema    . Sleep apnea    . Tricuspid valve regurgitation          Medications Prior to Admission     Prescriptions    albuterol sulfate (PROVENTIL OR VENTOLIN OR PROAIR) 90 mcg/actuation Inhalation oral inhaler    Take 1-2 Puffs by inhalation Every 6 hours as needed    amoxicillin-pot clavulanate (AUGMENTIN) 500-125 mg Oral Tablet    Take 1 Tablet by mouth Twice daily Take for 10 days; prescription was filled 08-02-21    aspirin (ECOTRIN) 81 mg Oral Tablet, Delayed Release (E.C.)    Take 1 Tablet (81 mg total) by mouth Once a day    budesonide-formoteroL (SYMBICORT) 160-4.5 mcg/actuation Inhalation oral inhaler    Take 2 Puffs by inhalation Twice daily    carvediloL (COREG) 25 mg Oral Tablet    Take 1 Tablet (25 mg total) by mouth Twice daily     cinacalcet (SENSIPAR) 30 mg Oral Tablet    Take 1 Tablet (30 mg total) by mouth Every evening    cloNIDine (CATAPRES-TTS) 0.3 mg/24 hr Transdermal Patch Weekly    Place 1 Patch (0.3 mg total) on the skin Every 7 days    clopidogreL (PLAVIX) 75 mg Oral Tablet    Take 1 Tablet (75 mg total) by mouth Once a day    cyclobenzaprine (FLEXERIL) 5 mg Oral Tablet    Take 1 Tablet (5 mg total) by mouth Twice per day as needed for Muscle spasms    dilTIAZem HCL (CARDIZEM) 120 mg Oral Tablet    Take 1 Tablet (120 mg total) by mouth Once a day    doxazosin (CARDURA) 4 mg Oral Tablet    Take 1 Tablet (4 mg total) by mouth Every evening    ferric citrate (AURYXIA ORAL)    Take 1 g by mouth Three times daily before meals "Take 2 tablets three times daily before meals and 1 tablet before snacks"    furosemide (LASIX) 80 mg Oral Tablet    Take 1 Tablet (80 mg total) by mouth Twice daily    guaiFENesin (MUCINEX) 600 mg Oral Tablet Extended Release 12hr    Take 1 Tablet (600  mg total) by mouth Every 12 hours    hydrOXYzine pamoate (VISTARIL) 25 mg Oral Capsule    Take 1 Capsule (25 mg total) by mouth Every night as needed for Itching    Patient not taking:  Reported on 08/05/2021    isosorbide mononitrate (IMDUR) 60 mg Oral Tablet Sustained Release 24 hr    Take 1 Tablet (60 mg total) by mouth Every morning    labetaloL (NORMODYNE) 200 mg Oral Tablet    Take 1 Tablet (200 mg total) by mouth Twice daily    loratadine (CLARITIN) 10 mg Oral Tablet    Take 1 Tablet (10 mg total) by mouth Once a day    losartan (COZAAR) 50 mg Oral Tablet    Take 1 Tablet (50 mg total) by mouth Once a day    Patient not taking:  Reported on 08/05/2021    montelukast (SINGULAIR) 10 mg Oral Tablet    Take 1 Tablet (10 mg total) by mouth Every evening    nitroGLYCERIN (NITROSTAT) 0.4 mg Sublingual Tablet, Sublingual    Place 1 Tablet (0.4 mg total) under the tongue Every 5 minutes for 3 doses for 3 doses over 15 minutes    ondansetron (ZOFRAN) 4 mg Oral Tablet     Take 1 Tablet (4 mg total) by mouth Every 12 hours as needed for Nausea/Vomiting    oxyCODONE-acetaminophen (PERCOCET) 5-325 mg Oral Tablet    Take 1 Tablet by mouth Every 4 hours as needed    Patient not taking:  Reported on 08/05/2021    pantoprazole (PROTONIX) 40 mg Oral Tablet, Delayed Release (E.C.)    Take 1 Tablet (40 mg total) by mouth Once a day    spironolactone (ALDACTONE) 25 mg Oral Tablet    Take 1 Tablet (25 mg total) by mouth Twice daily        albuterol 90 mcg per inhalation oral inhaler - "Respiratory to administer", 2 Puff, Inhalation, Q6H PRN  aspirin chewable tablet 81 mg, 81 mg, Oral, Daily  budesonide-formoterol (SYMBICORT) 160 mcg-4.5 mcg per inhalation oral inhaler - "Respiratory to administer", 2 Puff, Inhalation, 2x/day  carvedilol (COREG) tablet, 25 mg, Oral, 2x/day  cinacalcet (SENSIPAR) tablet, 30 mg, Oral, QPM  [START ON 08/09/2021] cloNIDine (CATAPRES-TTS) transdermal patch (mg/24 hr), 0.3 mg, Transdermal, Q7 Days  clopidogrel (PLAVIX) 75 mg tablet, 75 mg, Oral, Daily  cyclobenzaprine (FLEXERIL) tablet, 5 mg, Oral, 2x/day PRN  dextromethorphan-guaiFENesin (ROBITUSSIN DM) 10-100mg  per 25mL oral liquid, 10 mL, Oral, Q4H PRN  dilTIAZem (CARDIZEM) tablet, 120 mg, Oral, Daily  doxazosin (CARDURA) tablet, 4 mg, Oral, QPM  furosemide (LASIX) tablet, 80 mg, Oral, 2x/day  guaiFENesin (MUCINEX) extended release tablet - for cough (expectorant), 600 mg, Oral, Q12H  isosorbide mononitrate (IMDUR) 24 hr extended release tablet, 60 mg, Oral, QAM  labetalol (NORMODYNE) tablet, 200 mg, Oral, 2x/day  loratadine (CLARITIN) tablet, 10 mg, Oral, Daily  [START ON 08/06/2021] losartan (COZAAR) tablet, 50 mg, Oral, Daily  montelukast (SINGULAIR) 10 mg tablet, 10 mg, Oral, QPM  nitroGLYCERIN (NITROSTAT) sublingual tablet, 0.4 mg, Sublingual, Q5 Min PRN  ondansetron (ZOFRAN ODT) rapid dissolve tablet, 4 mg, Oral, Q12H PRN  pantoprazole (PROTONIX) delayed release tablet, 40 mg, Oral, Daily  spironolactone  (ALDACTONE) tablet, 25 mg, Oral, 2x/day      Allergies   Allergen Reactions   . Lisinopril Swelling     Tongue and throat swelling       ROS:   Constitutional: negative for fevers, chills, sweats, and fatigue  Eyes:  negative for visual disturbance, irritation, redness, and icterus  Ears, nose, mouth, throat, and face: negative for hearing loss, tinnitus, ear drainage, nasal congestion, epistaxis, and sore throat  Respiratory: negative for cough, sputum, hemoptysis, wheezing, or dyspnea on exertion  Cardiovascular: negative for chest pain, palpitations, , orthopnea, paroxysmal nocturnal dyspnea, and lower extremity edema  Gastrointestinal: negative for  nausea, vomiting, melena, diarrhea, constipation, and abdominal pain  Genitourinary:negative for frequency, dysuria, nocturia, urinary incontinence, hesitancy, and hematuria  Integument/breast: negative for rash, skin lesion(s), and pruritus  Hematologic/lymphatic: negative for easy bruising, bleeding, and petechiae  Musculoskeletal:negative for myalgias, arthralgias, neck pain, back pain, and muscle weakness  Neurological: negative for headaches, dizziness, seizures, speech problems, tremor, and weakness  Behavioral/Psych: negative for anxiety, behavior problems, mood swings, and sleep disturbance  Endocrine: negative for temperature intolerance  Allergic/Immunologic: negative for urticaria and angioedema      EXAM:  Filed Vitals:    08/05/21 0700 08/05/21 0800 08/05/21 0858 08/05/21 1700   BP: (!) 194/102 (!) 196/110 (!) 182/90 (!) 173/108   Pulse: 72 74 73 (!) 106   Resp: 16 16 18 16    Temp:   36.5 C (97.7 F)    SpO2: 98% 98% 93% 97%       Constitutional: Alert and Oriented time person and place . Not in acute distress  HEENT : Vision and hearing grossly normal   Eyes: Conjunctiva clear., Pupils equal and round, reactive to light and accomodation. , Sclera non-icteric.   ENT: Nose without erythema. , Mouth mucous membranes moist.   Neck: JVD negative no  thyromegaly or lymphadenopathy and supple, symmetrical, trachea midline  Respiratory: Clear to auscultation bilaterally. No wheezes, No rales, Good air exchange bilaterally  Cardiovascular: regular rate and rhythm no murmurs no rub  Gastrointestinal: Soft, non-tender, Bowel sounds normal, No hepatosplenomegaly  Genitourinary: no suprapubic tenderness  Lower Extremities : No edema . Pulses + 4 bilateral   Neurologic: Grossly normal. Speech clear.   Psychiatric: Normal mood and affect,   Labs:         CBC Results Differential Results   Recent Labs     08/05/21  0253   WBC 8.4  8.4   HGB 9.2*   HCT 28.4*    Recent Labs     08/05/21  0253   PMNS 78*   LYMPHOCYTES 12*   MONOCYTES 10      BMP Results Other Chemistries Results   Recent Labs     08/05/21  0253   SODIUM 128*   POTASSIUM 5.9*   CHLORIDE 92*   CO2 24   BUN 63*   CREATININE 11.99*   GFR 4*   ANIONGAP 12    Recent Labs     08/05/21  0253   CALCIUM 10.1      Liver/Pancreas Enzyme Results Blood Gas     No results for input(s): TOTALPROTEIN, ALBUMIN, PREALBUMIN, AST, ALT, ALKPHOS, LDH, AMYLASE, LIPASE in the last 72 hours.    Invalid input(s): GGT No results found for this encounter   Cardiac Results    Coags Results     TROPONIN I  Lab Results   Component Value Date    UHCEASTTROPI 0.06 ([place]) 12/13/2020    TROPONINI 59 (H) 08/05/2021    TROPONINI 55 (H) 08/05/2021    TROPONINI 52 (H) 08/05/2021         Recent Labs     08/05/21  0253   INR 1.19*  Imaging Studies:    XR CHEST AP   Final Result   FINDINGS ARE SIMILAR TO 07/08/2021 AND COULD BE SOME MILD PULMONARY VASCULAR CONGESTION         Radiologist location ID: [id number]              Assessment/Plan:  Active Hospital Problems    Diagnosis   . Primary Problem: Dyspnea   . Anemia in chronic kidney disease   . LVH (left ventricular hypertrophy)   . Coronary artery disease involving native coronary artery   . ESRD (end stage renal disease) (CMS [place])   . Benign hypertension      End-stage renal disease  hemodialysis Monday Wednesday Friday came in with shortness of breadth cough chest x-ray showed congestion will dialyze her with ultrafiltration 3-4 L today continue dialysis as scheduled  History of LVH and congestive heart failure will dialyze her today  Anemia and chronic kidney disease check anemia profile and PTH  Benign hypertension keep blood pressure controlled      Richardean Sale, MD

## 2021-08-05 NOTE — Care Plan (Signed)
[facility] Plan Note      Problem: Adult Inpatient Plan of Care  Goal: Patient-Specific Goal (Individualized)  Outcome: Ongoing (see interventions/notes)  Flowsheets (Taken 08/05/2021 1009)  Individualized Care Needs: [place] vital signs and labs  Anxieties, Fears or Concerns: Pain control  Patient-Specific Goals (Include Timeframe): Wants to be discharged soon       Situation: Admitted with dyspnea.    Intervention: O2 in use at 2 lpm via nasal cannula, respiratory statues and O2 saturation monitored.    Response: Reports feeling less short of breath; awaiting providers to see for plan of care.      Fuller Plan, RN

## 2021-08-06 DIAGNOSIS — I10 Essential (primary) hypertension: Secondary | ICD-10-CM

## 2021-08-06 DIAGNOSIS — N289 Disorder of kidney and ureter, unspecified: Secondary | ICD-10-CM

## 2021-08-06 DIAGNOSIS — I517 Cardiomegaly: Secondary | ICD-10-CM

## 2021-08-06 LAB — CBC WITH DIFF
BASOPHIL #: 0.1 10*3/uL (ref 0.00–0.30)
BASOPHIL %: 2 % (ref 0–3)
EOSINOPHIL #: 0.3 10*3/uL (ref 0.00–0.80)
EOSINOPHIL %: 4 % (ref 0–7)
HCT: 26.1 % — ABNORMAL LOW (ref 37.0–47.0)
HGB: 9 g/dL — ABNORMAL LOW (ref 12.5–16.0)
LYMPHOCYTE #: 0.6 10*3/uL — ABNORMAL LOW (ref 1.10–5.00)
LYMPHOCYTE %: 10 % — ABNORMAL LOW (ref 25–45)
MCH: 31.4 pg (ref 27.0–32.0)
MCHC: 34.4 g/dL (ref 32.0–36.0)
MCV: 91.3 fL (ref 78.0–99.0)
MONOCYTE #: 1.1 10*3/uL (ref 0.00–1.30)
MONOCYTE %: 17 % — ABNORMAL HIGH (ref 0–12)
MPV: 6.9 fL — ABNORMAL LOW (ref 7.4–10.4)
NEUTROPHIL #: 4.2 10*3/uL (ref 1.80–8.40)
NEUTROPHIL %: 67 % (ref 40–76)
PLATELETS: 228 10*3/uL (ref 140–440)
RBC: 2.86 10*6/uL — ABNORMAL LOW (ref 4.20–5.40)
RDW: 19.1 % — ABNORMAL HIGH (ref 11.6–14.8)
WBC: 6.3 10*3/uL (ref 4.0–10.5)
WBCS UNCORRECTED: 6.3 10*3/uL

## 2021-08-06 LAB — FOLATE: FOLATE: 5.7 ng/mL — ABNORMAL LOW (ref 5.9–24.4)

## 2021-08-06 LAB — COMPREHENSIVE METABOLIC PANEL, NON-FASTING
ALBUMIN/GLOBULIN RATIO: 1.1 (ref 0.8–1.4)
ALBUMIN: 4.1 g/dL (ref 3.5–5.7)
ALKALINE PHOSPHATASE: 76 U/L (ref 34–104)
ALT (SGPT): <7 U/L — ABNORMAL LOW (ref 7–52)
ANION GAP: 11 mmol/L (ref 10–20)
AST (SGOT): 15 U/L (ref 13–39)
BILIRUBIN TOTAL: 0.5 mg/dL (ref 0.3–1.2)
BUN/CREA RATIO: 4 — ABNORMAL LOW (ref 6–22)
BUN: 38 mg/dL — ABNORMAL HIGH (ref 7–25)
CALCIUM, CORRECTED: 9.7 mg/dL (ref 8.9–10.8)
CALCIUM: 9.8 mg/dL (ref 8.6–10.3)
CHLORIDE: 93 mmol/L — ABNORMAL LOW (ref 98–107)
CO2 TOTAL: 26 mmol/L (ref 21–31)
CREATININE: 8.69 mg/dL — ABNORMAL HIGH (ref 0.60–1.30)
ESTIMATED GFR: 5 mL/min/{1.73_m2} — ABNORMAL LOW (ref >59)
GLOBULIN: 3.6 (ref 2.9–5.4)
GLUCOSE: 69 mg/dL — ABNORMAL LOW (ref 74–109)
OSMOLALITY, CALCULATED: 268 mOsm/kg — ABNORMAL LOW (ref 270–290)
POTASSIUM: 5.3 mmol/L — ABNORMAL HIGH (ref 3.5–5.1)
PROTEIN TOTAL: 7.7 g/dL (ref 6.4–8.9)
SODIUM: 130 mmol/L — ABNORMAL LOW (ref 136–145)

## 2021-08-06 LAB — MAGNESIUM: MAGNESIUM: 2 mg/dL (ref 1.9–2.7)

## 2021-08-06 LAB — PARATHYROID HORMONE (PTH): PTH: 249.5 pg/mL — ABNORMAL HIGH (ref 12.0–88.0)

## 2021-08-06 LAB — VITAMIN B12: VITAMIN B 12: 260 pg/mL (ref 180–914)

## 2021-08-06 LAB — PHOSPHORUS: PHOSPHORUS: 7.4 mg/dL — ABNORMAL HIGH (ref 3.7–7.2)

## 2021-08-06 NOTE — ED Nurses Note (Signed)
At 1418, I called floor of +blood culture in 1 of 2 bottles. It was showing gram + in clusters. Nurse on [place] will contact Dr Olena Heckle about blood culture. Discuss with Brantley Persons NP, she suggested I contact her Dialysis unit about the blood culture results

## 2021-08-06 NOTE — ED Nurses Note (Signed)
Fax report to [facility] in [place].

## 2021-08-06 NOTE — Discharge Summary (Signed)
[place] SUMMARY    PATIENT NAME:  [name], [name]  MRN:  [id number]  DOB:  1976-01-21    ENCOUNTER DATE:  08/05/2021  INPATIENT ADMISSION DATE:   DISCHARGE DATE:  08/06/2021    ATTENDING PHYSICIAN: Vale Haven, DO  SERVICE: [facility] HOSPITALIST 4  PRIMARY CARE PHYSICIAN: Cam Hai, FNP       No lay caregiver identified.      PRIMARY DISCHARGE DIAGNOSIS: Dyspnea  Active Hospital Problems    Diagnosis Date Noted   . Principal Problem: Dyspnea [R06.00] 08/05/2021   . Anemia in chronic kidney disease [N18.9, D63.1] 08/05/2021   . Hypervolemia associated with renal insufficiency [E87.70, N28.9] 08/05/2021   . LVH (left ventricular hypertrophy) [I51.7] 07/01/2021   . Coronary artery disease involving native coronary artery [I25.10] 07/01/2021   . ESRD (end stage renal disease) on dialysis (CMS HCC) [N18.6, Z99.2] 12/04/2020   . Uncontrolled hypertension [I10] 12/04/2020      Resolved Hospital Problems   No resolved problems to display.     Active Non-Hospital Problems    Diagnosis Date Noted   . Hypertensive heart disease 07/01/2021   . Mild aortic stenosis 07/01/2021   . Noncompliance 07/01/2021   . Elevated troponin I level 06/30/2021   . Nausea 06/30/2021   . Congestive heart failure (CMS HCC) 06/30/2021   . Chronic chest pain 06/27/2021   . 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 Tobacco dependence 12/04/2020   . GERD (gastroesophageal reflux disease) 12/04/2020   . Insomnia 12/04/2020           Current Discharge Medication List      CONTINUE these medications - NO CHANGES were made during your visit.      Details   albuterol sulfate 90 mcg/actuation oral inhaler  Commonly known as: PROVENTIL or VENTOLIN or PROAIR   1-2 Puffs, Inhalation, EVERY 6 HOURS PRN  Refills: 0     amoxicillin-pot clavulanate 500-125 mg Tablet  Commonly known as: AUGMENTIN   1 Tablet, Oral, 2 TIMES DAILY, Take for 10 days; prescription was filled 08-02-21  Refills: 0     aspirin 81 mg Tablet, Delayed Release  (E.C.)  Commonly known as: ECOTRIN   81 mg, Oral, DAILY  Refills: 0     AURYXIA ORAL   1 g, Oral, 3 TIMES DAILY BEFORE MEALS, "Take 2 tablets three times daily before meals and 1 tablet before snacks"  Refills: 0     budesonide-formoteroL 160-4.5 mcg/actuation oral inhaler  Commonly known as: SYMBICORT   2 Puffs, Inhalation, 2 TIMES DAILY  Refills: 0     cinacalcet 30 mg Tablet  Commonly known as: SENSIPAR   30 mg, Oral, EVERY EVENING  Refills: 0     cloNIDine 0.3 mg/24 hr Patch Weekly  Commonly known as: CATAPRES-TTS   0.3 mg, Transdermal, EVERY 7 DAYS  Refills: 0     clopidogreL 75 mg Tablet  Commonly known as: PLAVIX   75 mg, Oral, DAILY  Refills: 0     cyclobenzaprine 5 mg Tablet  Commonly known as: FLEXERIL   5 mg, Oral, 2 TIMES DAILY PRN  Refills: 0     dilTIAZem 120 mg Capsule, Sust. Release 24 hr  Commonly known as: CARDIZEM CD   120 mg, Oral, DAILY  Refills: 0     doxazosin 4 mg Tablet  Commonly known as: CARDURA   4 mg, Oral, EVERY EVENING  Refills: 0     furosemide 80 mg Tablet  Commonly  known as: LASIX   80 mg, Oral, 2 TIMES DAILY  Refills: 0     guaiFENesin 600 mg Tablet Extended Release 12hr  Commonly known as: MUCINEX   600 mg, Oral, EVERY 12 HOURS  Refills: 0     isosorbide mononitrate 60 mg Tablet Sustained Release 24 hr  Commonly known as: IMDUR   60 mg, Oral, EVERY MORNING  Refills: 0     labetaloL 200 mg Tablet  Commonly known as: NORMODYNE   200 mg, Oral, 2 TIMES DAILY  Refills: 0     loratadine 10 mg Tablet  Commonly known as: CLARITIN   10 mg, Oral, DAILY  Refills: 0     montelukast 10 mg Tablet  Commonly known as: SINGULAIR   10 mg, Oral, EVERY EVENING  Refills: 0     nitroGLYCERIN 0.4 mg Tablet, Sublingual  Commonly known as: NITROSTAT   0.4 mg, Sublingual, EVERY 5 MIN, for 3 doses over 15 minutes  Qty: 3 Tablet  Refills: 0     ondansetron 4 mg Tablet  Commonly known as: ZOFRAN   4 mg, Oral, EVERY 12 HOURS PRN  Refills: 0     pantoprazole 40 mg Tablet, Delayed Release (E.C.)  Commonly known  as: PROTONIX   40 mg, Oral, DAILY  Refills: 0        STOP taking these medications.    carvediloL 25 mg Tablet  Commonly known as: COREG     hydrOXYzine pamoate 25 mg Capsule  Commonly known as: VISTARIL     losartan 50 mg Tablet  Commonly known as: COZAAR     oxyCODONE-acetaminophen 5-325 mg Tablet  Commonly known as: PERCOCET     spironolactone 25 mg Tablet  Commonly known as: ALDACTONE          Discharge med list refreshed?  YES     Allergies   Allergen Reactions   . Lisinopril Swelling     Tongue and throat swelling     HOSPITAL PROCEDURE(S):   No orders of the defined types were placed in this encounter.      [place] COURSE   Patient is a 46 year old female past medical history of end-stage renal disease on hemodialysis Monday Wednesday Friday, chronic CHF, chronic atypical chest wall pain. Patient states she had a nonproductive cough for the past 3 days with body aches and chills. Patient was dialyzed on Friday and was due for dialysis the day of admission.  The patient was found to have and elevated blood pressure on admission.  After dialysis the patient's blood pressure was much better and she was having no signs of respiratory distress.  Her VSS remained stable throughout her hospital stay. The patient was discharged in stable and improved condition on 08/06/21.  She will need to follow up with her PCP in 7-10 days and follow up with out patient dialyssi as prev. Scheduled.  Dr. Darius Bump from nephrology followed the patient throughout her stay and felt she was stable for discharge.     CONDITION ON DISCHARGE:  A. Ambulation: Full ambulation  B. Self-care Ability: Complete  C. Cognitive Status Alert and Oriented x 3  D. Code status at discharge:       LINES/DRAINS/WOUNDS AT DISCHARGE:   Patient Lines/Drains/Airways Status     Active Line / Dialysis Catheter / Dialysis Graft / Drain / Airway / Wound     Name Placement date Placement time Site Days    Peripheral IV Left  Basilic  (medial side of arm) 08/05/21  0338  -- 1    AV Fistula Left;Upper Arm 12/05/20  1502  -- 243    Dialysis Catheter 12/05/20  1503  -- 243                DISCHARGE DISPOSITION:  Home discharge  DISCHARGE INSTRUCTIONS:  Post-Discharge Follow Up Appointments     Thursday Aug 08, 2021    Return Patient Visit with Beather Arbour, MD at  9:00 AM    Nephrology, [place], [place] [address]  [phone]        No discharge procedures on file.       Vale Haven, DO    Copies sent to Care Team       Relationship Specialty Notifications Start End    Deel, Leona Carry, FNP PCP - General NURSE PRACTITIONER  12/04/20     Phone: [phone] Fax: [phone]         [address] [place] [address]          Referring providers can utilize [email or web address] to access their referred [facility] patient's information.

## 2021-08-06 NOTE — Nurses Notes (Signed)
Patient discharged home with family.  AVS reviewed with patient/care giver.  A written copy of the AVS and discharge instructions was given to the patient/care giver. Scripts handed to patient/care giver. Questions sufficiently answered as needed.  Patient/care giver encouraged to follow up with PCP as indicated.  In the event of an emergency, patient/care giver instructed to call 911 or go to the nearest emergency room.

## 2021-08-07 LAB — ECG 12 LEAD
Atrial Rate: 97 {beats}/min
Calculated P Axis: 93 degrees
Calculated R Axis: -19 degrees
Calculated T Axis: 99 degrees
PR Interval: 194 ms
QRS Duration: 98 ms
QT Interval: 370 ms
QTC Calculation: 469 ms
Ventricular rate: 97 {beats}/min

## 2021-08-08 ENCOUNTER — Encounter (INDEPENDENT_AMBULATORY_CARE_PROVIDER_SITE_OTHER): Payer: Self-pay | Admitting: Nephrology

## 2021-08-10 LAB — ADULT ROUTINE BLOOD CULTURE, SET OF 2 BOTTLES (BACTERIA AND YEAST)
BLOOD CULTURE, ROUTINE: ABNORMAL — CR
BLOOD CULTURE, ROUTINE: NO GROWTH

## 2021-08-11 ENCOUNTER — Emergency Department (HOSPITAL_BASED_OUTPATIENT_CLINIC_OR_DEPARTMENT_OTHER): Payer: Commercial Managed Care - PPO

## 2021-08-11 ENCOUNTER — Other Ambulatory Visit: Payer: Self-pay

## 2021-08-11 ENCOUNTER — Inpatient Hospital Stay
Admission: EM | Admit: 2021-08-11 | Discharge: 2021-08-12 | DRG: 299 | Disposition: A | Discharge status: Other Acute Inpatient Hospital | Payer: Commercial Managed Care - PPO | Attending: Critical Care Medicine | Admitting: Critical Care Medicine

## 2021-08-11 ENCOUNTER — Inpatient Hospital Stay (HOSPITAL_COMMUNITY): Payer: Commercial Managed Care - PPO

## 2021-08-11 ENCOUNTER — Encounter (HOSPITAL_BASED_OUTPATIENT_CLINIC_OR_DEPARTMENT_OTHER): Payer: Self-pay

## 2021-08-11 DIAGNOSIS — J45909 Unspecified asthma, uncomplicated: Secondary | ICD-10-CM | POA: Diagnosis present

## 2021-08-11 DIAGNOSIS — I10 Essential (primary) hypertension: Secondary | ICD-10-CM | POA: Diagnosis present

## 2021-08-11 DIAGNOSIS — E875 Hyperkalemia: Secondary | ICD-10-CM | POA: Diagnosis present

## 2021-08-11 DIAGNOSIS — Z86718 Personal history of other venous thrombosis and embolism: Secondary | ICD-10-CM

## 2021-08-11 DIAGNOSIS — I120 Hypertensive chronic kidney disease with stage 5 chronic kidney disease or end stage renal disease: Secondary | ICD-10-CM | POA: Diagnosis present

## 2021-08-11 DIAGNOSIS — I871 Compression of vein: Secondary | ICD-10-CM | POA: Diagnosis present

## 2021-08-11 DIAGNOSIS — I071 Rheumatic tricuspid insufficiency: Secondary | ICD-10-CM | POA: Diagnosis present

## 2021-08-11 DIAGNOSIS — Z91199 Patient's noncompliance with other medical treatment and regimen due to unspecified reason: Secondary | ICD-10-CM

## 2021-08-11 DIAGNOSIS — I509 Heart failure, unspecified: Secondary | ICD-10-CM

## 2021-08-11 DIAGNOSIS — J449 Chronic obstructive pulmonary disease, unspecified: Secondary | ICD-10-CM | POA: Diagnosis present

## 2021-08-11 DIAGNOSIS — M898X9 Other specified disorders of bone, unspecified site: Secondary | ICD-10-CM | POA: Diagnosis present

## 2021-08-11 DIAGNOSIS — R220 Localized swelling, mass and lump, head: Secondary | ICD-10-CM | POA: Diagnosis present

## 2021-08-11 DIAGNOSIS — Z7901 Long term (current) use of anticoagulants: Secondary | ICD-10-CM

## 2021-08-11 DIAGNOSIS — D649 Anemia, unspecified: Secondary | ICD-10-CM

## 2021-08-11 DIAGNOSIS — I132 Hypertensive heart and chronic kidney disease with heart failure and with stage 5 chronic kidney disease, or end stage renal disease: Secondary | ICD-10-CM

## 2021-08-11 DIAGNOSIS — G4730 Sleep apnea, unspecified: Secondary | ICD-10-CM | POA: Diagnosis present

## 2021-08-11 DIAGNOSIS — E8770 Fluid overload, unspecified: Secondary | ICD-10-CM | POA: Diagnosis present

## 2021-08-11 DIAGNOSIS — R778 Other specified abnormalities of plasma proteins: Secondary | ICD-10-CM | POA: Diagnosis present

## 2021-08-11 DIAGNOSIS — Z20822 Contact with and (suspected) exposure to covid-19: Secondary | ICD-10-CM | POA: Diagnosis present

## 2021-08-11 DIAGNOSIS — R0902 Hypoxemia: Secondary | ICD-10-CM | POA: Diagnosis present

## 2021-08-11 DIAGNOSIS — I82210 Acute embolism and thrombosis of superior vena cava: Principal | ICD-10-CM | POA: Diagnosis present

## 2021-08-11 DIAGNOSIS — D631 Anemia in chronic kidney disease: Secondary | ICD-10-CM | POA: Diagnosis present

## 2021-08-11 DIAGNOSIS — K219 Gastro-esophageal reflux disease without esophagitis: Secondary | ICD-10-CM | POA: Diagnosis present

## 2021-08-11 DIAGNOSIS — I252 Old myocardial infarction: Secondary | ICD-10-CM

## 2021-08-11 DIAGNOSIS — I5032 Chronic diastolic (congestive) heart failure: Secondary | ICD-10-CM | POA: Diagnosis present

## 2021-08-11 DIAGNOSIS — Z7951 Long term (current) use of inhaled steroids: Secondary | ICD-10-CM

## 2021-08-11 DIAGNOSIS — R0682 Tachypnea, not elsewhere classified: Secondary | ICD-10-CM

## 2021-08-11 DIAGNOSIS — N186 End stage renal disease: Secondary | ICD-10-CM | POA: Diagnosis present

## 2021-08-11 DIAGNOSIS — Z79899 Other long term (current) drug therapy: Secondary | ICD-10-CM

## 2021-08-11 DIAGNOSIS — J811 Chronic pulmonary edema: Secondary | ICD-10-CM | POA: Diagnosis present

## 2021-08-11 DIAGNOSIS — Z7982 Long term (current) use of aspirin: Secondary | ICD-10-CM

## 2021-08-11 DIAGNOSIS — Z992 Dependence on renal dialysis: Secondary | ICD-10-CM

## 2021-08-11 DIAGNOSIS — I160 Hypertensive urgency: Secondary | ICD-10-CM | POA: Diagnosis present

## 2021-08-11 DIAGNOSIS — I340 Nonrheumatic mitral (valve) insufficiency: Secondary | ICD-10-CM | POA: Diagnosis present

## 2021-08-11 LAB — BASIC METABOLIC PANEL
ANION GAP: 17 mmol/L (ref 10–20)
BUN/CREA RATIO: 5 — ABNORMAL LOW (ref 6–22)
BUN: 73 mg/dL — ABNORMAL HIGH (ref 7–25)
CALCIUM: 10.6 mg/dL — ABNORMAL HIGH (ref 8.6–10.3)
CHLORIDE: 93 mmol/L — ABNORMAL LOW (ref 98–107)
CO2 TOTAL: 21 mmol/L (ref 21–31)
CREATININE: 14.1 mg/dL — ABNORMAL HIGH (ref 0.60–1.30)
ESTIMATED GFR: 3 mL/min/{1.73_m2} — ABNORMAL LOW (ref >59)
GLUCOSE: 113 mg/dL — ABNORMAL HIGH (ref 74–109)
OSMOLALITY, CALCULATED: 285 mOsm/kg (ref 270–290)
POTASSIUM: 6.8 mmol/L (ref 3.5–5.1)
SODIUM: 131 mmol/L — ABNORMAL LOW (ref 136–145)

## 2021-08-11 LAB — COMPREHENSIVE METABOLIC PANEL, NON-FASTING
ALBUMIN/GLOBULIN RATIO: 0.7 — ABNORMAL LOW (ref 0.8–1.4)
ALBUMIN: 3.4 g/dL (ref 3.4–5.0)
ALKALINE PHOSPHATASE: 87 U/L (ref 46–116)
ALT (SGPT): 11 U/L (ref <=78)
ANION GAP: 15 mmol/L (ref 10–20)
AST (SGOT): 15 U/L (ref 15–37)
BILIRUBIN TOTAL: 0.9 mg/dL (ref 0.2–1.0)
BUN/CREA RATIO: 5
BUN: 70 mg/dL — ABNORMAL HIGH (ref 7–18)
CALCIUM, CORRECTED: 10.2 mg/dL
CALCIUM: 9.6 mg/dL (ref 8.5–10.1)
CHLORIDE: 93 mmol/L — ABNORMAL LOW (ref 98–107)
CO2 TOTAL: 24 mmol/L (ref 21–32)
CREATININE: 14.06 mg/dL — ABNORMAL HIGH (ref 0.55–1.02)
ESTIMATED GFR: 3 mL/min/{1.73_m2} — ABNORMAL LOW (ref >59)
GLOBULIN: 4.8
GLUCOSE: 88 mg/dL (ref 74–106)
OSMOLALITY, CALCULATED: 285 mOsm/kg (ref 270–290)
POTASSIUM: 6.5 mmol/L (ref 3.5–5.1)
PROTEIN TOTAL: 8.2 g/dL (ref 6.4–8.2)
SODIUM: 132 mmol/L — ABNORMAL LOW (ref 136–145)

## 2021-08-11 LAB — ARTERIAL BLOOD GAS/LACTATE
BASE EXCESS (ARTERIAL): -1 mmol/L (ref <=2.0)
BICARBONATE (ARTERIAL): 24.5 mmol/L (ref 20.0–26.0)
CARBOXYHEMOGLOBIN: 2.6 % — ABNORMAL HIGH (ref <=1.5)
MET-HEMOGLOBIN: 0.5 % (ref <=2.0)
O2CT: 11.4 %
OXYHEMOGLOBIN: 90.7 % (ref 88.0–100.0)
PCO2 (ARTERIAL): 44 mm/Hg (ref 35–45)
PH (ARTERIAL): 7.35 (ref 7.35–7.45)
PO2 (ARTERIAL): 75 mm/Hg — ABNORMAL LOW (ref 80–100)

## 2021-08-11 LAB — CBC WITH DIFF
BASOPHIL #: 0.06 10*3/uL (ref 0.00–0.30)
BASOPHIL %: 0 % (ref 0–3)
EOSINOPHIL #: 0.25 10*3/uL (ref 0.00–0.80)
EOSINOPHIL %: 2 % (ref 0–7)
HCT: 18.6 % — CL (ref 37.0–47.0)
HGB: 6 g/dL — CL (ref 12.5–16.0)
LYMPHOCYTE #: 1.06 10*3/uL — ABNORMAL LOW (ref 1.10–5.00)
LYMPHOCYTE %: 8 % — ABNORMAL LOW (ref 25–45)
MCH: 30.3 pg (ref 27.0–32.0)
MCHC: 32.4 g/dL (ref 32.0–36.0)
MCV: 93.3 fL (ref 78.0–99.0)
MONOCYTE #: 0.92 10*3/uL (ref 0.00–1.30)
MONOCYTE %: 7 % (ref 0–12)
MPV: 7.1 fL — ABNORMAL LOW (ref 7.4–10.4)
NEUTROPHIL #: 11.11 10*3/uL — ABNORMAL HIGH (ref 1.80–8.40)
NEUTROPHIL %: 83 % — ABNORMAL HIGH (ref 40–76)
PLATELETS: 369 10*3/uL (ref 140–440)
RBC: 1.99 10*6/uL — ABNORMAL LOW (ref 4.20–5.40)
RDW: 18.5 % — ABNORMAL HIGH (ref 11.6–14.8)
WBC: 13.4 10*3/uL — ABNORMAL HIGH (ref 4.0–10.5)

## 2021-08-11 LAB — CBC
HCT: 27.9 % — ABNORMAL LOW (ref 37.0–47.0)
HGB: 9.6 g/dL — ABNORMAL LOW (ref 12.5–16.0)
MCH: 30.8 pg (ref 27.0–32.0)
MCHC: 34.2 g/dL (ref 32.0–36.0)
MCV: 90 fL (ref 78.0–99.0)
MPV: 6.6 fL — ABNORMAL LOW (ref 7.4–10.4)
PLATELETS: 297 10*3/uL (ref 140–440)
RBC: 3.1 10*6/uL — ABNORMAL LOW (ref 4.20–5.40)
RDW: 19.1 % — ABNORMAL HIGH (ref 11.6–14.8)
WBC: 9.4 10*3/uL (ref 4.0–10.5)
WBCS UNCORRECTED: 9.4 10*3/uL

## 2021-08-11 LAB — COVID-19, FLU A/B, RSV RAPID BY PCR
INFLUENZA VIRUS TYPE A: NOT DETECTED
INFLUENZA VIRUS TYPE B: NOT DETECTED
RESPIRATORY SYNCTIAL VIRUS (RSV): NOT DETECTED
SARS-CoV-2: NOT DETECTED

## 2021-08-11 LAB — NT-PROBNP: NT-PROBNP: >35000 pg/mL — ABNORMAL HIGH (ref <=125)

## 2021-08-11 LAB — LACTIC ACID LEVEL W/ REFLEX FOR LEVEL >2.0: LACTIC ACID: 0.7 mmol/L (ref 0.4–2.0)

## 2021-08-11 LAB — TROPONIN-I
TROPONIN I: 56 ng/L — ABNORMAL HIGH (ref <15)
TROPONIN I: 56 ng/L — ABNORMAL HIGH (ref <15)
TROPONIN I: 58 ng/L — ABNORMAL HIGH (ref <15)

## 2021-08-11 LAB — MAGNESIUM: MAGNESIUM: 2.1 mg/dL (ref 1.8–2.4)

## 2021-08-11 LAB — PTT (PARTIAL THROMBOPLASTIN TIME): APTT: 79.3 seconds — ABNORMAL HIGH (ref 26.0–36.0)

## 2021-08-11 LAB — PHOSPHORUS: PHOSPHORUS: 10.4 mg/dL — ABNORMAL HIGH (ref 3.7–7.2)

## 2021-08-11 MED ORDER — INSULIN REGULAR HUMAN 100 UNIT/ML INJECTION - CHARGE BY DOSE
10.0000 [IU] | INTRAMUSCULAR | Status: AC
Start: 2021-08-11 — End: 2021-08-11
  Administered 2021-08-11: 10 [IU] via SUBCUTANEOUS

## 2021-08-11 MED ORDER — HEPARIN (PORCINE) 25,000 UNIT/250 ML IN 0.45 % SODIUM CHLORIDE IV SOLN
18.0000 [IU]/kg/h | INTRAVENOUS | Status: DC
Start: 2021-08-12 — End: 2021-08-12
  Administered 2021-08-12: 0 [IU]/kg/h via INTRAVENOUS
  Administered 2021-08-12: 18 [IU]/kg/h via INTRAVENOUS
  Filled 2021-08-11: qty 250

## 2021-08-11 MED ORDER — MORPHINE 2 MG/ML INJECTION WRAPPER
2.0000 mg | INJECTION | INTRAMUSCULAR | Status: AC
Start: 2021-08-11 — End: 2021-08-11
  Administered 2021-08-11: 2 mg via INTRAVENOUS

## 2021-08-11 MED ORDER — FUROSEMIDE 10 MG/ML INJECTION SOLUTION
INTRAMUSCULAR | Status: AC
Start: 2021-08-11 — End: 2021-08-11
  Filled 2021-08-11: qty 8

## 2021-08-11 MED ORDER — FUROSEMIDE 10 MG/ML INJECTION SOLUTION
60.0000 mg | INTRAMUSCULAR | Status: AC
Start: 2021-08-11 — End: 2021-08-11
  Administered 2021-08-11: 60 mg via INTRAVENOUS

## 2021-08-11 MED ORDER — DEXTROSE 50 % IN WATER (D50W) INTRAVENOUS SYRINGE
INJECTION | INTRAVENOUS | Status: AC
Start: 2021-08-11 — End: 2021-08-11
  Filled 2021-08-11: qty 50

## 2021-08-11 MED ORDER — HEPARIN (PORCINE) 5,000 UNITS/ML BOLUS
80.0000 [IU]/kg | Freq: Once | INTRAMUSCULAR | Status: AC
Start: 2021-08-12 — End: 2021-08-12
  Administered 2021-08-12: 5500 [IU] via INTRAVENOUS
  Filled 2021-08-11: qty 2

## 2021-08-11 MED ORDER — CALCIUM CHLORIDE 100 MG/ML (10 %) INTRAVENOUS SYRINGE
INJECTION | INTRAVENOUS | Status: AC
Start: 2021-08-11 — End: 2021-08-11
  Filled 2021-08-11: qty 10

## 2021-08-11 MED ORDER — SODIUM CHLORIDE 0.9 % INTRAVENOUS SOLUTION
500.0000 mg | INTRAVENOUS | Status: AC
Start: 2021-08-11 — End: 2021-08-11
  Administered 2021-08-11: 0 mg via INTRAVENOUS
  Administered 2021-08-11: 500 mg via INTRAVENOUS

## 2021-08-11 MED ORDER — MORPHINE 2 MG/ML INJECTION WRAPPER
INJECTION | INTRAMUSCULAR | Status: AC
Start: 2021-08-11 — End: 2021-08-11
  Filled 2021-08-11: qty 1

## 2021-08-11 MED ORDER — PROCHLORPERAZINE EDISYLATE 10 MG/2 ML (5 MG/ML) INJECTION SOLUTION
INTRAMUSCULAR | Status: AC
Start: 2021-08-11 — End: 2021-08-11
  Filled 2021-08-11: qty 2

## 2021-08-11 MED ORDER — DEXAMETHASONE SODIUM PHOSPHATE (PF) 10 MG/ML INJECTION SOLUTION
10.0000 mg | INTRAMUSCULAR | Status: AC
Start: 2021-08-11 — End: 2021-08-11
  Administered 2021-08-11: 10 mg via INTRAVENOUS

## 2021-08-11 MED ORDER — ALBUTEROL SULFATE 2.5 MG/3 ML (0.083 %) SOLUTION FOR NEBULIZATION
2.5000 mg | INHALATION_SOLUTION | RESPIRATORY_TRACT | Status: AC
Start: 2021-08-11 — End: 2021-08-11
  Administered 2021-08-11: 2.5 mg via RESPIRATORY_TRACT

## 2021-08-11 MED ORDER — CALCIUM CHLORIDE 100 MG/ML (10 %) INTRAVENOUS SYRINGE
1000.0000 mg | INJECTION | INTRAVENOUS | Status: AC
Start: 2021-08-11 — End: 2021-08-11
  Administered 2021-08-11: 1000 mg via INTRAVENOUS

## 2021-08-11 MED ORDER — HYDRALAZINE 20 MG/ML INJECTION SOLUTION
20.0000 mg | INTRAMUSCULAR | Status: DC
Start: 2021-08-11 — End: 2021-08-11

## 2021-08-11 MED ORDER — LABETALOL 20 MG/4 ML (5 MG/ML) INTRAVENOUS SYRINGE
INJECTION | INTRAVENOUS | Status: AC
Start: 2021-08-11 — End: 2021-08-11
  Filled 2021-08-11: qty 4

## 2021-08-11 MED ORDER — CEFTRIAXONE 1 GRAM SOLUTION FOR INJECTION
INTRAMUSCULAR | Status: AC
Start: 2021-08-11 — End: 2021-08-11
  Filled 2021-08-11: qty 10

## 2021-08-11 MED ORDER — MIDODRINE 2.5 MG TABLET
5.0000 mg | ORAL_TABLET | Freq: Every day | ORAL | Status: DC | PRN
Start: 2021-08-11 — End: 2021-08-12

## 2021-08-11 MED ORDER — SODIUM POLYSTYRENE SULFONATE 15 GRAM-SORBITOL 20 GRAM/60 ML ORAL SUSP
15.0000 g | ORAL | Status: AC
Start: 2021-08-11 — End: 2021-08-11
  Administered 2021-08-11: 15 g via ORAL

## 2021-08-11 MED ORDER — AZITHROMYCIN 500 MG INTRAVENOUS SOLUTION
INTRAVENOUS | Status: AC
Start: 2021-08-11 — End: 2021-08-11
  Filled 2021-08-11: qty 5

## 2021-08-11 MED ORDER — SODIUM CHLORIDE 0.9 % INTRAVENOUS PIGGYBACK
INJECTION | INTRAVENOUS | Status: AC
Start: 2021-08-11 — End: 2021-08-11
  Filled 2021-08-11: qty 50

## 2021-08-11 MED ORDER — ONDANSETRON HCL (PF) 4 MG/2 ML INJECTION SOLUTION
INTRAMUSCULAR | Status: AC
Start: 2021-08-11 — End: 2021-08-11
  Filled 2021-08-11: qty 2

## 2021-08-11 MED ORDER — PROCHLORPERAZINE EDISYLATE 10 MG/2 ML (5 MG/ML) INJECTION SOLUTION
5.0000 mg | INTRAMUSCULAR | Status: AC
Start: 2021-08-11 — End: 2021-08-11
  Administered 2021-08-11: 5 mg via INTRAVENOUS

## 2021-08-11 MED ORDER — HYDRALAZINE 20 MG/ML INJECTION SOLUTION
20.0000 mg | INTRAMUSCULAR | Status: AC
Start: 2021-08-11 — End: 2021-08-11
  Administered 2021-08-11: 20 mg via INTRAVENOUS

## 2021-08-11 MED ORDER — SODIUM CHLORIDE 0.9 % INTRAVENOUS SOLUTION
5.0000 mg/h | INTRAVENOUS | Status: DC
Start: 2021-08-11 — End: 2021-08-12
  Administered 2021-08-12: 5 mg/h via INTRAVENOUS
  Administered 2021-08-12: 0 mg/h via INTRAVENOUS
  Administered 2021-08-12: 5 mg/h via INTRAVENOUS
  Administered 2021-08-12: 0 mg/h via INTRAVENOUS
  Filled 2021-08-11 (×11): qty 10

## 2021-08-11 MED ORDER — SODIUM CHLORIDE 0.9 % INTRAVENOUS PIGGYBACK
1.0000 g | INTRAVENOUS | Status: AC
Start: 2021-08-11 — End: 2021-08-11
  Administered 2021-08-11: 0 g via INTRAVENOUS
  Administered 2021-08-11: 1 g via INTRAVENOUS

## 2021-08-11 MED ORDER — IOHEXOL 350 MG IODINE/ML INTRAVENOUS SOLUTION
50.0000 mL | INTRAVENOUS | Status: AC
Start: 2021-08-11 — End: 2021-08-11
  Administered 2021-08-11: 50 mL via INTRAVENOUS

## 2021-08-11 MED ORDER — DEXTROSE 50 % IN WATER (D50W) INTRAVENOUS SYRINGE
25.0000 g | INJECTION | INTRAVENOUS | Status: AC
Start: 2021-08-11 — End: 2021-08-11
  Administered 2021-08-11: 50 mL via INTRAVENOUS
  Filled 2021-08-11: qty 50

## 2021-08-11 MED ORDER — SODIUM POLYSTYRENE SULFONATE 15 GRAM-SORBITOL 20 GRAM/60 ML ORAL SUSP
ORAL | Status: AC
Start: 2021-08-11 — End: 2021-08-11
  Filled 2021-08-11: qty 60

## 2021-08-11 MED ORDER — DEXAMETHASONE SODIUM PHOSPHATE (PF) 10 MG/ML INJECTION SOLUTION
INTRAMUSCULAR | Status: AC
Start: 2021-08-11 — End: 2021-08-11
  Filled 2021-08-11: qty 1

## 2021-08-11 MED ORDER — SODIUM CHLORIDE 0.9 % (FLUSH) INJECTION SYRINGE
3.0000 mL | INJECTION | INTRAMUSCULAR | Status: DC | PRN
Start: 2021-08-11 — End: 2021-08-12

## 2021-08-11 MED ORDER — ALBUTEROL SULFATE 2.5 MG/3 ML (0.083 %) SOLUTION FOR NEBULIZATION
INHALATION_SOLUTION | RESPIRATORY_TRACT | Status: AC
Start: 2021-08-11 — End: 2021-08-11
  Filled 2021-08-11: qty 9

## 2021-08-11 MED ORDER — LABETALOL 20 MG/4 ML (5 MG/ML) INTRAVENOUS SYRINGE
20.0000 mg | INJECTION | INTRAVENOUS | Status: AC
Start: 2021-08-11 — End: 2021-08-11
  Administered 2021-08-11: 20 mg via INTRAVENOUS

## 2021-08-11 MED ORDER — IPRATROPIUM 0.5 MG-ALBUTEROL 3 MG (2.5 MG BASE)/3 ML NEBULIZATION SOLN
3.0000 mL | INHALATION_SOLUTION | RESPIRATORY_TRACT | Status: AC
Start: 2021-08-11 — End: 2021-08-11
  Administered 2021-08-11: 3 mL via RESPIRATORY_TRACT

## 2021-08-11 MED ORDER — SODIUM CHLORIDE 0.9 % (FLUSH) INJECTION SYRINGE
3.0000 mL | INJECTION | Freq: Three times a day (TID) | INTRAMUSCULAR | Status: DC
Start: 2021-08-11 — End: 2021-08-12
  Administered 2021-08-11: 3 mL
  Administered 2021-08-11: 0 mL
  Administered 2021-08-12: 3 mL

## 2021-08-11 MED ORDER — INSULIN U-100 REGULAR HUMAN 100 UNIT/ML INJECTION SOLUTION
INTRAMUSCULAR | Status: AC
Start: 2021-08-11 — End: 2021-08-11
  Filled 2021-08-11: qty 30

## 2021-08-11 MED ORDER — NITROGLYCERIN 2 % TRANSDERMAL OINTMENT - PACKET
1.5000 [in_us] | TOPICAL_OINTMENT | TRANSDERMAL | Status: AC
Start: 2021-08-11 — End: 2021-08-11
  Administered 2021-08-11: 1.5 [in_us] via TOPICAL

## 2021-08-11 MED ORDER — IPRATROPIUM 0.5 MG-ALBUTEROL 3 MG (2.5 MG BASE)/3 ML NEBULIZATION SOLN
INHALATION_SOLUTION | RESPIRATORY_TRACT | Status: AC
Start: 2021-08-11 — End: 2021-08-11
  Filled 2021-08-11: qty 3

## 2021-08-11 MED ORDER — HYDRALAZINE 20 MG/ML INJECTION SOLUTION
INTRAMUSCULAR | Status: AC
Start: 2021-08-11 — End: 2021-08-11
  Filled 2021-08-11: qty 1

## 2021-08-11 MED ORDER — ONDANSETRON HCL (PF) 4 MG/2 ML INJECTION SOLUTION
4.0000 mg | INTRAMUSCULAR | Status: AC
Start: 2021-08-11 — End: 2021-08-11
  Administered 2021-08-11: 4 mg via INTRAVENOUS

## 2021-08-11 MED ORDER — NITROGLYCERIN 2 % TRANSDERMAL OINTMENT - PACKET
TOPICAL_OINTMENT | TRANSDERMAL | Status: AC
Start: 2021-08-11 — End: 2021-08-11
  Filled 2021-08-11: qty 2

## 2021-08-11 NOTE — ED Triage Notes (Signed)
Ems reports pt has had chest pain and shortness of breath since waking this am. Pain left chest radiates into left arm and into left back. 20 ga iv left forearm, saline lock. Aspirin 324 mg po, nitro x 2 sublingual x 2 in route with some decrease in chest pain . Pt c/o headache since nitro given. Initial O2 sat 88% room air

## 2021-08-11 NOTE — ED Nurses Note (Signed)
Patient reports chest pain mid sternal since this morning, increasing shortness of breath. Did not have dialysis Friday when scheduled d/t not having a ride. Patient has a cough, reports tightness in chest. Reports yellow/green mucus but occasionally a dry hacking cough. Edema noted to bilateral cheeks, bilateral eyes. Wheezing bilaterally, expiratory wheezing, and rales scattered posteriorly.

## 2021-08-11 NOTE — ED Nurses Note (Signed)
Patient reports chest pain has lessened, and so has upper back pain. Denies any current needs at this time, nausea has resolved. Will continue to monitor patient.

## 2021-08-11 NOTE — ED Nurses Note (Signed)
Provider made aware of CCU acceptance of patient with current blood pressure.

## 2021-08-11 NOTE — ED Nurses Note (Signed)
EMS leaving with patient en route with patient to [facility].

## 2021-08-11 NOTE — ED Nurses Note (Signed)
Report called to Ronda Fairly at [facility]. All questions/concerns answered, asked to call when patient leaves our facility.

## 2021-08-11 NOTE — ED Nurses Note (Signed)
Patient c/o nausea and dry heaving, provider informed and medications ordered.

## 2021-08-11 NOTE — ED Nurses Note (Signed)
Patient medicated as per orders, reports some chest pain and recurring nausea. Patient reports pain extends to left upper back and pain is worse than usual.

## 2021-08-11 NOTE — H&P (Cosign Needed)
[place]    HOSPITALIST H&P    [name] 46 y.o. female [place]   Date of Service: 08/11/2021    Date of Admission:  08/11/2021   PCP: Cam Hai, FNP Code Status:Full Code       Chief Complaint:  Shortness of breath, wheezing, chest pain, missed dialysis treatment    HPI:   [name] is a 46 year old female who presented to the ED at [facility] with shortness of breath and wheezing for past few days. She reported onset of midsternal chest pain earlier today. She has history of ESRD on HD, supposed to be on MWF HD schedule, last HD treatment Wednesday, 08/07/21. On presentation to ED, she was markedly hypertensive at 208/139 mmHg and mildly tachypneic. Initial CBC revealed WBC 13.4k, Hgb 6g, Plt 369k; repeat CBC revealed WBC 9.4k, Hgb 9.6g, Plt 297k. Potassium elevated at 6.5. BUN 70 with creatinine 14.06. Phos 10.4. LFTs WNL. HS trop 56/58. Pro-BNP >35K. Initial ABG unremarkable. COVID/Flu/RSV negative. Blood cultures pending. CXR read as mild pulmonary edema. She was transferred to our facility for further evaluation and treatment. Patient seen and evaluated on arrival to our facility. On my evaluation, she was falling asleep while talking. She was noted to have facial swelling, bilateral breast swelling, and upper airway restrictive breath sounds. 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        ED medications:   Medications Administered in the ED   NS flush syringe (has no administration in time range)   NS flush syringe (has no administration in time range)   furosemide (LASIX) 10 mg/mL injection (60 mg Intravenous Given 08/11/21 1256)   dexamethasone (PF) 10 mg/mL injection (10 mg Intravenous Given 08/11/21 1256)   ipratropium-albuterol 0.5 mg-3 mg(2.5 mg base)/3 mL Solution for Nebulization (3 mL Nebulization Given 08/11/21 1216)    cefTRIAXone (ROCEPHIN) 1 g in NS 50 mL IVPB minibag (0 g Intravenous Stopped 08/11/21 1336)   azithromycin (ZITHROMAX) 500 mg in NS 250 mL IVPB (0 mg Intravenous Stopped 08/11/21 1356)   albuterol (PROVENTIL) 2.5 mg / 3 mL (0.083%) neb solution (has no administration in time range)   albuterol (PROVENTIL) 2.5 mg / 3 mL (0.083%) neb solution (has no administration in time range)   albuterol (PROVENTIL) 2.5 mg / 3 mL (0.083%) neb solution (has no administration in time range)   sodium polystyrene sulfonate (KAYEXALATE) 15 g per 60 mL oral liquid - with sorbitol (has no administration in time range)   dextrose 50% (0.5 g/mL) injection - syringe (has no administration in time range)   insulin R human 100 units/mL injection (has no administration in time range)   calcium chloride 100 mg/mL injection (has no administration in time range)         PMHx:    Past Medical History:   Diagnosis Date   . Asthma    . Chronic diastolic CHF (congestive heart failure) (CMS HCC)    . Esophageal reflux    . ESRD (end stage renal disease) (CMS [place])    . History of anemia due to CKD    . Hypertension    . MI (myocardial infarction) (CMS [place])    . Mitral valve regurgitation    . Pulmonary edema    . Sleep apnea    . Tricuspid valve regurgitation  PSHx:   Past Surgical History:   Procedure Laterality Date   . ESOPHAGOGASTRODUODENOSCOPY     . HX BACK SURGERY     . HX CHOLECYSTECTOMY     . HX FOOT SURGERY Right    . HX HYSTERECTOMY     . HX TONSILLECTOMY            Allergies:    Allergies   Allergen Reactions   . Lisinopril Swelling     Tongue and throat swelling    Social History  Social History     Tobacco Use   . Smoking status: Every Day     Packs/day: 0.25     Types: Cigarettes   . Smokeless tobacco: Never   Vaping Use   . Vaping Use: Never used   Substance Use Topics   . Alcohol use: Not Currently   . Drug use: Yes     Types: Marijuana       Family History  Family Medical History:     Problem Relation (Age of Onset)    Breast  Cancer Mother    Coronary Artery Disease Mother    Diabetes type II Mother    Hypertension (High Blood Pressure) Father             Home Meds:      Prior to Admission medications    Medication Sig Start Date End Date Taking? Authorizing Provider   albuterol sulfate (PROVENTIL OR VENTOLIN OR PROAIR) 90 mcg/actuation Inhalation oral inhaler Take 1-2 Puffs by inhalation Every 6 hours as needed    Provider, Historical   amoxicillin-pot clavulanate (AUGMENTIN) 500-125 mg Oral Tablet Take 1 Tablet by mouth Twice daily Take for 10 days; prescription was filled 08-02-21    Provider, Historical   aspirin (ECOTRIN) 81 mg Oral Tablet, Delayed Release (E.C.) Take 1 Tablet (81 mg total) by mouth Once a day    Provider, Historical   budesonide-formoteroL (SYMBICORT) 160-4.5 mcg/actuation Inhalation oral inhaler Take 2 Puffs by inhalation Twice daily    Provider, Historical   cinacalcet (SENSIPAR) 30 mg Oral Tablet Take 1 Tablet (30 mg total) by mouth Every evening    Provider, Historical   cloNIDine (CATAPRES-TTS) 0.3 mg/24 hr Transdermal Patch Weekly Place 1 Patch (0.3 mg total) on the skin Every 7 days 08/02/21   Provider, Historical   clopidogreL (PLAVIX) 75 mg Oral Tablet Take 1 Tablet (75 mg total) by mouth Once a day    Provider, Historical   cyclobenzaprine (FLEXERIL) 5 mg Oral Tablet Take 1 Tablet (5 mg total) by mouth Twice per day as needed for Muscle spasms    Provider, Historical   dilTIAZem (CARDIZEM CD) 120 mg Oral Capsule, Sust. Release 24 hr Take 1 Capsule (120 mg total) by mouth Once a day    Provider, Historical   doxazosin (CARDURA) 4 mg Oral Tablet Take 1 Tablet (4 mg total) by mouth Every evening    Provider, Historical   ferric citrate (AURYXIA ORAL) Take 1 g by mouth Three times daily before meals "Take 2 tablets three times daily before meals and 1 tablet before snacks"    Provider, Historical   furosemide (LASIX) 80 mg Oral Tablet Take 1 Tablet (80 mg total) by mouth Twice daily    Provider, Historical    guaiFENesin (MUCINEX) 600 mg Oral Tablet Extended Release 12hr Take 1 Tablet (600 mg total) by mouth Every 12 hours    Provider, Historical   isosorbide mononitrate (IMDUR) 60 mg Oral Tablet Sustained  Release 24 hr Take 1 Tablet (60 mg total) by mouth Every morning    Provider, Historical   labetaloL (NORMODYNE) 200 mg Oral Tablet Take 1 Tablet (200 mg total) by mouth Twice daily    Provider, Historical   loratadine (CLARITIN) 10 mg Oral Tablet Take 1 Tablet (10 mg total) by mouth Once a day    Provider, Historical   montelukast (SINGULAIR) 10 mg Oral Tablet Take 1 Tablet (10 mg total) by mouth Every evening    Provider, Historical   nitroGLYCERIN (NITROSTAT) 0.4 mg Sublingual Tablet, Sublingual Place 1 Tablet (0.4 mg total) under the tongue Every 5 minutes for 3 doses for 3 doses over 15 minutes 06/23/21 08/05/21  Elder, Gildardo Cranker, MD   ondansetron (ZOFRAN) 4 mg Oral Tablet Take 1 Tablet (4 mg total) by mouth Every 12 hours as needed for Nausea/Vomiting    Provider, Historical   pantoprazole (PROTONIX) 40 mg Oral Tablet, Delayed Release (E.C.) Take 1 Tablet (40 mg total) by mouth Once a day    Provider, Historical   carvediloL (COREG) 25 mg Oral Tablet Take 1 Tablet (25 mg total) by mouth Twice daily  08/06/21  Provider, Historical   cloNIDine HCL (CATAPRES) 0.3 mg Oral Tablet Take 1 Tablet (0.3 mg total) by mouth Three times a day  08/05/21  Provider, Historical   hydrOXYzine pamoate (VISTARIL) 25 mg Oral Capsule Take 1 Capsule (25 mg total) by mouth Every night as needed for Itching  Patient not taking: Reported on 08/05/2021  08/06/21  Provider, Historical   losartan (COZAAR) 50 mg Oral Tablet Take 1 Tablet (50 mg total) by mouth Once a day  Patient not taking: Reported on 08/05/2021  08/06/21  Provider, Historical   oxyCODONE-acetaminophen (PERCOCET) 5-325 mg Oral Tablet Take 1 Tablet by mouth Every 4 hours as needed  Patient not taking: Reported on 08/05/2021 12/13/20 08/06/21  Buena Irish, MD   polyethylene  glycol (MIRALAX) 17 gram Oral Powder in Packet Take 1 Packet (17 g total) by mouth Once a day 12/14/20 08/05/21  Buena Irish, MD   sennosides-docusate sodium (SENOKOT-S) 8.6-50 mg Oral Tablet Take 1 Tablet by mouth Twice daily 12/13/20 08/05/21  Buena Irish, MD   spironolactone (ALDACTONE) 25 mg Oral Tablet Take 1 Tablet (25 mg total) by mouth Twice daily  08/06/21  Provider, Historical          ROS:   Unable to obtain secondary to mentation      Results for orders placed or performed during the hospital encounter of 08/11/21 (from the past 24 hour(s))   ARTERIAL BLOOD GAS/LACTATE   Result Value Ref Range    PH (ARTERIAL) 7.35 7.35 - 7.45    PCO2 (ARTERIAL) 44 35 - 45 mm/Hg    BICARBONATE (ARTERIAL) 24.5 20.0 - 26.0 mmol/L    BASE EXCESS (ARTERIAL) -1.0 -2.0 - 2.0 mmol/L    MET-HEMOGLOBIN 0.5 <=2.0 %    LACTATE      CARBOXYHEMOGLOBIN 2.6 (H) <=1.5 %    O2CT 11.4 %    %FIO2 (ARTERIAL)      PO2 (ARTERIAL) 75 (L) 80 - 100 mm/Hg    OXYHEMOGLOBIN 90.7 88.0 - 100.0 %    ALLEN TEST      DRAW SITE     COVID-19, FLU A/B, RSV RAPID BY PCR   Result Value Ref Range    SARS-CoV-2 Not Detected Not Detected    INFLUENZA VIRUS TYPE A Not Detected Not Detected    INFLUENZA VIRUS TYPE B Not  Detected Not Detected    RESPIRATORY SYNCTIAL VIRUS (RSV) Not Detected Not Detected   TROPONIN NOW   Result Value Ref Range    TROPONIN I 56 (H) <15 ng/L   LACTIC ACID LEVEL W/ REFLEX FOR LEVEL >2.0   Result Value Ref Range    LACTIC ACID 0.7 0.4 - 2.0 mmol/L   COMPREHENSIVE METABOLIC PANEL, NON-FASTING   Result Value Ref Range    SODIUM 132 (L) 136 - 145 mmol/L    POTASSIUM 6.5 (HH) 3.5 - 5.1 mmol/L    CHLORIDE 93 (L) 98 - 107 mmol/L    CO2 TOTAL 24 21 - 32 mmol/L    ANION GAP 15 10 - 20 mmol/L    BUN 70 (H) 7 - 18 mg/dL    CREATININE 14.06 (H) 0.55 - 1.02 mg/dL    BUN/CREA RATIO 5     ESTIMATED GFR 3 (L) >59 mL/min/1.1m2    ALBUMIN 3.4 3.4 - 5.0 g/dL    CALCIUM 9.6 8.5 - 10.1 mg/dL    GLUCOSE 88 74 - 106 mg/dL    ALKALINE  PHOSPHATASE 87 46 - 116 U/L    ALT (SGPT) 11 <=78 U/L    AST (SGOT) 15 15 - 37 U/L    BILIRUBIN TOTAL 0.9 0.2 - 1.0 mg/dL    PROTEIN TOTAL 8.2 6.4 - 8.2 g/dL    ALBUMIN/GLOBULIN RATIO 0.7 (L) 0.8 - 1.4    OSMOLALITY, CALCULATED 285 270 - 290 mOsm/kg    CALCIUM, CORRECTED 10.2 mg/dL    GLOBULIN 4.8    NT-PROBNP- BLF ONLY   Result Value Ref Range    NT-PROBNP >35,000 (H) <=125 pg/mL   PHOSPHORUS   Result Value Ref Range    PHOSPHORUS 10.4 (H) 3.7 - 7.2 mg/dL   MAGNESIUM   Result Value Ref Range    MAGNESIUM 2.1 1.8 - 2.4 mg/dL   CBC WITH DIFF   Result Value Ref Range    WBCS UNCORRECTED      WBC 13.4 (H) 4.0 - 10.5 x10^3/uL    RBC 1.99 (L) 4.20 - 5.40 x10^6/uL    HGB 6.0 (LL) 12.5 - 16.0 g/dL    HCT 18.6 (LL) 37.0 - 47.0 %    MCV 93.3 78.0 - 99.0 fL    MCH 30.3 27.0 - 32.0 pg    MCHC 32.4 32.0 - 36.0 g/dL    RDW 18.5 (H) 11.6 - 14.8 %    PLATELETS 369 140 - 440 x10^3/uL    MPV 7.1 (L) 7.4 - 10.4 fL    NEUTROPHIL % 83 (H) 40 - 76 %    LYMPHOCYTE % 8 (L) 25 - 45 %    MONOCYTE % 7 0 - 12 %    EOSINOPHIL % 2 0 - 7 %    BASOPHIL % 0 0 - 3 %    NEUTROPHIL # 11.11 (H) 1.80 - 8.40 x10^3/uL    LYMPHOCYTE # 1.06 (L) 1.10 - 5.00 x10^3/uL    MONOCYTE # 0.92 0.00 - 1.30 x10^3/uL    EOSINOPHIL # 0.25 0.00 - 0.80 x10^3/uL    BASOPHIL # 0.06 0.00 - 0.30 x10^3/uL   TROPONIN IN ONE HOUR   Result Value Ref Range    TROPONIN I 56 (H) <15 ng/L   TYPE AND SCREEN   Result Value Ref Range    UNITS ORDERED 2     ABO/RH(D) O POSITIVE     ANTIBODY SCREEN NEGATIVE     SPECIMEN EXPIRATION DATE 08/14/2021,2359  CROSSMATCH RED CELLS - UNITS , 2 Units   Result Value Ref Range    Coding System [id number]     UNIT NUMBER [id number]     BLOOD COMPONENT TYPE LR RBC Adsol3, 04741     UNIT DIVISION 00     UNIT DISPENSE STATUS ALLOCATED     TRANSFUSION STATUS OK TO TRANSFUSE     IS CROSSMATCH COMPATIBLE     Product Code [id number]     Coding System [id number]     UNIT NUMBER [id number]     BLOOD COMPONENT TYPE LR RBC Adsol3, 04741     UNIT DIVISION 00      UNIT DISPENSE STATUS ALLOCATED     TRANSFUSION STATUS OK TO TRANSFUSE     IS CROSSMATCH COMPATIBLE     Product Code [id number]    TROPONIN IN THREE HOURS   Result Value Ref Range    TROPONIN I 58 (H) <15 ng/L   BASIC METABOLIC PANEL   Result Value Ref Range    SODIUM 131 (L) 136 - 145 mmol/L    POTASSIUM 6.8 (HH) 3.5 - 5.1 mmol/L    CHLORIDE 93 (L) 98 - 107 mmol/L    CO2 TOTAL 21 21 - 31 mmol/L    ANION GAP 17 10 - 20 mmol/L    CALCIUM 10.6 (H) 8.6 - 10.3 mg/dL    GLUCOSE 113 (H) 74 - 109 mg/dL    BUN 73 (H) 7 - 25 mg/dL    CREATININE 14.10 (H) 0.60 - 1.30 mg/dL    BUN/CREA RATIO 5 (L) 6 - 22    ESTIMATED GFR 3 (L) >59 mL/min/1.64m2    OSMOLALITY, CALCULATED 285 270 - 290 mOsm/kg   CBC   Result Value Ref Range    WBCS UNCORRECTED 9.4 x10^3/uL    WBC 9.4 4.0 - 10.5 x10^3/uL    RBC 3.10 (L) 4.20 - 5.40 x10^6/uL    HGB 9.6 (L) 12.5 - 16.0 g/dL    HCT 27.9 (L) 37.0 - 47.0 %    MCV 90.0 78.0 - 99.0 fL    MCH 30.8 27.0 - 32.0 pg    MCHC 34.2 32.0 - 36.0 g/dL    RDW 19.1 (H) 11.6 - 14.8 %    PLATELETS 297 140 - 440 x10^3/uL    MPV 6.6 (L) 7.4 - 10.4 fL          Physical:  Filed Vitals:    08/11/21 2030 08/11/21 2045 08/11/21 2056 08/11/21 2100   BP: (!) 224/145 (!) 216/145 (!) 192/139 (!) 219/150   Pulse: 82 79 79 76   Resp: 12 13 13     Temp:       SpO2: 95% 95% 96% 95%      General: Patient is very somnolent. Will awaken, falls asleep easily.  Head: Noted facial/head swelling.    Eyes: Pupils equally round and react to light and accommodate.   Nose: Nasal passages clear.   Throat: Moist oral mucosa. No erythema or exudate of the pharynx.   Neck: Neck swelling noted. Trachea midline.  Heart: Regular rate and rhythm. S1 & S2 present. No S3 or S4. No rubs, gallops, or murmurs appreciated.  Radial and dorsalis pedis pulses +2/4 bilaterally.  Brisk capillary refill.    Lungs: Clear to auscultation bilaterally with no wheezes or rales. There are upper airway almost stridorous breath sounds noted.   Abdomen: Soft, nontender,  nondistended belly. Bowel sounds are present in all four quadrants. No rigidity.  No  guarding.  No ascites.   Extremities: Mild bilateral arm edema. No leg edema. No cyanosis or clubbing. Grossly moves all extremities.    Skin: Warm and dry without lesions. No ecchymosis noted.    Neurologic: Somnolent, falling asleep while speaking. Grossly moving all extremities.  Genitourinary:  deferred  Psychiatric: limited secondary to somnolence    Diagnostic studies:  No results found.       EKG interpretation: Sinus rhythm, rate 84, 1st degree AV block, slow R wave progression, PR 224 ms, QRS 100 ms, QTc 472 ms      @PEVF @    Assessments:  Active Hospital Problems   (*Primary Problem)    Diagnosis   . *Fluid overload   . Hypertensive urgency   . Facial swelling   . ESRD (end stage renal disease) on dialysis (CMS HCC)   . SVC syndrome   . Uncontrolled hypertension     1. Hypertensive urgency  2. Missed hemodialysis treatment  3. ESRD on HD  4. 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 Urgent hemodialysis. Nephrology consultation. We will start on heparin drip per protocol for SVC thrombus. If mentation remains impaired and restrictive upper airway breathing pattern, may require intubation/mechanical ventilation. If BP remains elevated after HD tx will start on nicardipine drip. Repeat labs in AM. Further interventions will depend on clinical course and progress.    Patient is a 46 year old female who will be admitted for the above problems.        Code status: Full Code  DVT prophylaxis: heparin  Diet: DIET RENAL Do you want to initiate MNT Protocol? 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  Patient will be closely evaluated monitor and remove be adjusted accordingly.  Estimated length of stay greater than 48 hr  to obtain full medical treatment.      Tivis Ringer, FNP-BC    [place] HOSPITALIST        Addendum;  Increased labetalol and clonidine patch.  Labetalol prn.

## 2021-08-11 NOTE — ED Provider Notes (Signed)
[facility], [facility] Emergency Department  ED Primary Provider Note  History of Present Illness   Chief Complaint   Patient presents with   . Chest Pain      Arrival: The patient arrived by Ambulance    Trine Alen is a 46 y.o. female who had concerns including Chest Pain .  Pt states she cant breath, coughing up large amt of white sputum  . Face swelling. Had dialysis 2 times this week last day Thursday. Didn't have a ride for 3rd time. States recently admitted and states less fluids was taken off than usual when admitted. States feels like she has a lot of fluid on it. On lasix makes urine. Got neb by ems mild relief w/ wheezing sat was 81% rm air. Not on home O2. BP very high.     Review of Systems   Constitutional: No fever, chills or weakness   Skin: No rash or diaphoresis  HENT: No headaches, or congestion  Eyes: No vision changes or photophobia   Cardio: + chest pain,no  palpitations  facial swelling   Respiratory: + cough, wheezing SOB  GI:  No nausea, vomiting or stool changes  GU:  No dysuria, hematuria, or increased frequency  MSK: No muscle aches, joint or back pain  Neuro: No seizures, LOC, numbness, tingling, or focal weakness  Psychiatric: No depression, SI or substance abuse  All other systems reviewed and are negative.    Historical Data   History Reviewed This Encounter: all noted and reviewed.     Physical Exam   ED Triage Vitals [08/11/21 1151]   BP (Non-Invasive) (!) 208/139   Heart Rate 87   Respiratory Rate (!) 22   Temperature 36.4 C (97.6 F)   SpO2 97 %   Weight 77.1 kg (170 lb)   Height 1.702 m (_0 )     Constitutional:  46 y.o. female who appears in no distress. Normal color, no cyanosis.   HENT:   Head: Normocephalic and atraumatic.   Mouth/Throat: Oropharynx is clear and moist.   Eyes: EOMI, PERRL   Neck: Trachea midline. Neck supple.  Cardiovascular: RRR, No murmurs, rubs or gallops. Intact distal pulses.  Pulmonary/Chest:  Rales diffuse  posterior, wheezing diffuse expiratory . freq cough. Mild distress.   Abdominal: Bowel sounds present and normal. Abdomen soft, no tenderness, no rebound and no guarding.  Back: No midline spinal tenderness, no paraspinal tenderness, no CVA tenderness.           Musculoskeletal: No edema, tenderness or deformity.  Skin: warm and dry. No rash, erythema, pallor or cyanosis  Psychiatric: normal mood and affect. Behavior is normal.   Neurological: Patient keenly alert and responsive, easily able to raise eyebrows, facial muscles/expressions symmetric, speaking in fluent sentences, moving all extremities equally and fully, normal gait  Patient Data     Labs Ordered/Reviewed   COMPREHENSIVE METABOLIC PANEL, NON-FASTING - Abnormal; Notable for the following components:       Result Value    SODIUM 132 (*)     POTASSIUM 6.5 (*)     CHLORIDE 93 (*)     BUN 70 (*)     CREATININE 14.06 (*)     ESTIMATED GFR 3 (*)     ALBUMIN/GLOBULIN RATIO 0.7 (*)     All other components within normal limits    Narrative:     Estimated Glomerular Filtration Rate (eGFR) is calculated using the CKD-EPI (2021) equation, intended for patients 18 years  of age and older. If gender is not documented or "unknown", there will be no eGFR calculation.   TROPONIN-I - Abnormal; Notable for the following components:    TROPONIN I 56 (*)     All other components within normal limits    Narrative:     Values received on females ranging between 12-15 ng/L MUST include the next serial troponin to review changes in the delta differences as the reference range for the Access II chemistry analyzer is lower than the established reference range.     NT-PROBNP - Abnormal; Notable for the following components:    NT-PROBNP >35,000 (*)     All other components within normal limits   ARTERIAL BLOOD GAS/LACTATE - Abnormal; Notable for the following components:    CARBOXYHEMOGLOBIN 2.6 (*)     PO2 (ARTERIAL) 75 (*)     All other components within normal limits   CBC WITH  DIFF - Abnormal; Notable for the following components:    WBC 13.4 (*)     RBC 1.99 (*)     HGB 6.0 (*)     HCT 18.6 (*)     RDW 18.5 (*)     MPV 7.1 (*)     NEUTROPHIL % 83 (*)     LYMPHOCYTE % 8 (*)     NEUTROPHIL # 11.11 (*)     LYMPHOCYTE # 1.06 (*)     All other components within normal limits    Narrative:     REPEATED   MAGNESIUM - Normal   LACTIC ACID LEVEL W/ REFLEX FOR LEVEL >2.0 - Normal   COVID-19, FLU A/B, RSV RAPID BY PCR - Normal    Narrative:     Results are for the simultaneous qualitative identification of SARS-CoV-2 (formerly 2019-nCoV), Influenza A, Influenza B, and RSV RNA. These etiologic agents are generally detectable in nasopharyngeal and nasal swabs during the ACUTE PHASE of infection. Hence, this test is intended to be performed on respiratory specimens collected from individuals with signs and symptoms of upper respiratory tract infection who meet Centers for Disease Control and Prevention (CDC) clinical and/or epidemiological criteria for Coronavirus Disease 2019 (COVID-19) testing. CDC COVID-19 criteria for testing on human specimens is available at [facility] webpage information for Healthcare Professionals: Coronavirus Disease 2019 (COVID-19) ([employer]).     False-negative results may occur if the virus has genomic mutations, insertions, deletions, or rearrangements or if performed very early in the course of illness. Otherwise, negative results indicate virus specific RNA targets are not detected, however negative results do not preclude SARS-CoV-2 infection/COVID-19, Influenza, or Respiratory syncytial virus infection. Results should not be used as the sole basis for patient management decisions. Negative results must be combined with clinical observations, patient history, and epidemiological information. If upper respiratory tract infection is still suspected based on exposure history together with other clinical findings, re-testing  should be considered.    Disclaimer:   This assay has been authorized by FDA under an Emergency Use Authorization for use in laboratories certified under the Clinical Laboratory Improvement Amendments of 1988 (CLIA), 42 U.S.C. ([phone], to perform high complexity tests. The impacts of vaccines, antiviral therapeutics, antibiotics, chemotherapeutic or immunosuppressant drugs have not been evaluated.     Test methodology:   Cepheid Xpert Xpress SARS-CoV-2/Flu/RSV Assay real-time polymerase chain reaction (RT-PCR) test on the GeneXpert Dx and Xpert Xpress systems.   ADULT ROUTINE BLOOD CULTURE, SET OF 2 BOTTLES (BACTERIA AND YEAST)   ADULT ROUTINE BLOOD CULTURE, SET OF 2 BOTTLES (  BACTERIA AND YEAST)   OCCULT BLOOD (PRN ED USE ONLY)   CBC/DIFF    Narrative:     The following orders were created for panel order CBC/DIFF.  Procedure                               Abnormality         Status                     ---------                               -----------         ------                     CBC WITH HLKT[[id number]]                Abnormal            Final result                 Please view results for these tests on the individual orders.   PHOSPHORUS   TROPONIN-I   TROPONIN-I     XR CHEST AP   Final Result by Edi, Radresults In (06/18 1223)   Mild pulmonary edema         Radiologist location ID: [place] Decision Making  diff dx of hypertensive urgency, CHF, pulm edema. Clots. Cad mi. Pneumonia COPD.          Medications Administered in the ED   NS flush syringe (has no administration in time range)   NS flush syringe (has no administration in time range)   hydrALAZINE (APRESOLINE) injection 20 mg (has no administration in time range)   albuterol (PROVENTIL) 2.5 mg / 3 mL (0.083%) neb solution (has no administration in time range)   albuterol (PROVENTIL) 2.5 mg / 3 mL (0.083%) neb solution (has no administration in time range)   albuterol (PROVENTIL) 2.5 mg / 3 mL (0.083%) neb solution (has no  administration in time range)   sodium polystyrene sulfonate (KAYEXALATE) 15 g per 60 mL oral liquid - with sorbitol (has no administration in time range)   dextrose 50% (0.5 g/mL) injection - syringe (has no administration in time range)   insulin R human 100 units/mL injection (has no administration in time range)   calcium chloride 100 mg/mL injection (has no administration in time range)   furosemide (LASIX) 10 mg/mL injection (60 mg Intravenous Given 08/11/21 1256)   dexamethasone (PF) 10 mg/mL injection (10 mg Intravenous Given 08/11/21 1256)   ipratropium-albuterol 0.5 mg-3 mg(2.5 mg base)/3 mL Solution for Nebulization (3 mL Nebulization Given 08/11/21 1216)   cefTRIAXone (ROCEPHIN) 1 g in NS 50 mL IVPB minibag (0 g Intravenous Stopped 08/11/21 1336)   azithromycin (ZITHROMAX) 500 mg in NS 250 mL IVPB (500 mg Intravenous New Bag/New Syringe 08/11/21 1256)     Clinical Impression   Pulmonary edema (Primary)   Chronic obstructive pulmonary disease, unspecified COPD type (CMS HCC)   Hypoxia   Anemia, unspecified type   Hypertensive urgency   Hyperkalemia   End stage renal disease (CMS [place])   1450 pt accepted pch dr. Pearline Cables..     Disposition: Admitted

## 2021-08-11 NOTE — ED Nurses Note (Addendum)
Report given to Terri RN in CCU, knew of previous hypertensive state.Nurse states as if the "provider feels its safe to send her that they can go ahead and transfer her". Terri RN aware of elevated blood pressure  of 216/145.

## 2021-08-11 NOTE — ED Nurses Note (Signed)
EMS here to transport patient

## 2021-08-11 NOTE — ED Nurses Note (Signed)
[facility] contacted to transport patient to main campus at this time

## 2021-08-11 NOTE — ED Nurses Note (Signed)
Patient lying in bed at this time, Blood pressure remains high. Provider informed, denies any further needs at this time. VSS, Will continue to monitor.

## 2021-08-11 NOTE — ED Nurses Note (Addendum)
Patient lying on right side,patient states that she is feels slightly lightheaded.  patient awaiting transport to CCU

## 2021-08-11 NOTE — ED Nurses Note (Signed)
Report called to Nunzio Cory at this time to [facility]. All questions/concerns answered, informed to get patient BP down before transferring patient to their ER. Provider informed.

## 2021-08-11 NOTE — ED Nurses Note (Signed)
Patient denies any further needs at this time, patient continues to experience expiratory wheezing and scattered rhonchi. Patient has been up to bedside commode and urinated approximately 248ml clear yellow urine.

## 2021-08-12 ENCOUNTER — Inpatient Hospital Stay (HOSPITAL_COMMUNITY): Payer: Commercial Managed Care - PPO

## 2021-08-12 ENCOUNTER — Encounter (HOSPITAL_COMMUNITY): Payer: Self-pay

## 2021-08-12 ENCOUNTER — Inpatient Hospital Stay
Admission: AD | Admit: 2021-08-12 | Discharge: 2021-08-19 | DRG: 270 | Disposition: A | Discharge status: Home / Self Care | Payer: Commercial Managed Care - PPO | Source: Other Acute Inpatient Hospital | Attending: Internal Medicine | Admitting: Internal Medicine

## 2021-08-12 DIAGNOSIS — Z9981 Dependence on supplemental oxygen: Secondary | ICD-10-CM

## 2021-08-12 DIAGNOSIS — J449 Chronic obstructive pulmonary disease, unspecified: Secondary | ICD-10-CM | POA: Diagnosis present

## 2021-08-12 DIAGNOSIS — B957 Other staphylococcus as the cause of diseases classified elsewhere: Secondary | ICD-10-CM | POA: Diagnosis present

## 2021-08-12 DIAGNOSIS — I871 Compression of vein: Secondary | ICD-10-CM

## 2021-08-12 DIAGNOSIS — I82210 Acute embolism and thrombosis of superior vena cava: Secondary | ICD-10-CM | POA: Diagnosis present

## 2021-08-12 DIAGNOSIS — Z79899 Other long term (current) drug therapy: Secondary | ICD-10-CM

## 2021-08-12 DIAGNOSIS — R9431 Abnormal electrocardiogram [ECG] [EKG]: Secondary | ICD-10-CM

## 2021-08-12 DIAGNOSIS — B3731 Acute candidiasis of vulva and vagina: Secondary | ICD-10-CM | POA: Diagnosis not present

## 2021-08-12 DIAGNOSIS — T82868A Thrombosis of vascular prosthetic devices, implants and grafts, initial encounter: Principal | ICD-10-CM | POA: Diagnosis present

## 2021-08-12 DIAGNOSIS — I161 Hypertensive emergency: Secondary | ICD-10-CM | POA: Diagnosis present

## 2021-08-12 DIAGNOSIS — E875 Hyperkalemia: Secondary | ICD-10-CM

## 2021-08-12 DIAGNOSIS — I252 Old myocardial infarction: Secondary | ICD-10-CM

## 2021-08-12 DIAGNOSIS — I5032 Chronic diastolic (congestive) heart failure: Secondary | ICD-10-CM | POA: Diagnosis present

## 2021-08-12 DIAGNOSIS — R0789 Other chest pain: Secondary | ICD-10-CM | POA: Diagnosis present

## 2021-08-12 DIAGNOSIS — N186 End stage renal disease: Secondary | ICD-10-CM | POA: Diagnosis present

## 2021-08-12 DIAGNOSIS — F17210 Nicotine dependence, cigarettes, uncomplicated: Secondary | ICD-10-CM | POA: Diagnosis present

## 2021-08-12 DIAGNOSIS — F419 Anxiety disorder, unspecified: Secondary | ICD-10-CM | POA: Diagnosis present

## 2021-08-12 DIAGNOSIS — Z7902 Long term (current) use of antithrombotics/antiplatelets: Secondary | ICD-10-CM

## 2021-08-12 DIAGNOSIS — G8929 Other chronic pain: Secondary | ICD-10-CM | POA: Diagnosis present

## 2021-08-12 DIAGNOSIS — R0600 Dyspnea, unspecified: Secondary | ICD-10-CM

## 2021-08-12 DIAGNOSIS — R34 Anuria and oliguria: Secondary | ICD-10-CM

## 2021-08-12 DIAGNOSIS — M549 Dorsalgia, unspecified: Secondary | ICD-10-CM | POA: Diagnosis present

## 2021-08-12 DIAGNOSIS — R45851 Suicidal ideations: Secondary | ICD-10-CM | POA: Diagnosis present

## 2021-08-12 DIAGNOSIS — E871 Hypo-osmolality and hyponatremia: Secondary | ICD-10-CM | POA: Diagnosis not present

## 2021-08-12 DIAGNOSIS — D631 Anemia in chronic kidney disease: Secondary | ICD-10-CM | POA: Diagnosis present

## 2021-08-12 DIAGNOSIS — R7881 Bacteremia: Secondary | ICD-10-CM | POA: Diagnosis present

## 2021-08-12 DIAGNOSIS — I440 Atrioventricular block, first degree: Secondary | ICD-10-CM | POA: Diagnosis present

## 2021-08-12 DIAGNOSIS — F4310 Post-traumatic stress disorder, unspecified: Secondary | ICD-10-CM | POA: Diagnosis present

## 2021-08-12 DIAGNOSIS — D649 Anemia, unspecified: Secondary | ICD-10-CM

## 2021-08-12 DIAGNOSIS — R061 Stridor: Secondary | ICD-10-CM

## 2021-08-12 DIAGNOSIS — I509 Heart failure, unspecified: Secondary | ICD-10-CM

## 2021-08-12 DIAGNOSIS — K219 Gastro-esophageal reflux disease without esophagitis: Secondary | ICD-10-CM | POA: Diagnosis present

## 2021-08-12 DIAGNOSIS — F329 Major depressive disorder, single episode, unspecified: Secondary | ICD-10-CM | POA: Diagnosis present

## 2021-08-12 DIAGNOSIS — Z992 Dependence on renal dialysis: Secondary | ICD-10-CM

## 2021-08-12 DIAGNOSIS — M899 Disorder of bone, unspecified: Secondary | ICD-10-CM

## 2021-08-12 DIAGNOSIS — I2510 Atherosclerotic heart disease of native coronary artery without angina pectoris: Secondary | ICD-10-CM | POA: Diagnosis present

## 2021-08-12 DIAGNOSIS — Z7982 Long term (current) use of aspirin: Secondary | ICD-10-CM

## 2021-08-12 DIAGNOSIS — G4733 Obstructive sleep apnea (adult) (pediatric): Secondary | ICD-10-CM | POA: Diagnosis present

## 2021-08-12 DIAGNOSIS — D72829 Elevated white blood cell count, unspecified: Secondary | ICD-10-CM | POA: Diagnosis not present

## 2021-08-12 DIAGNOSIS — Z91199 Patient's noncompliance with other medical treatment and regimen due to unspecified reason: Secondary | ICD-10-CM

## 2021-08-12 DIAGNOSIS — Z91158 Patient's noncompliance with renal dialysis for other reason: Secondary | ICD-10-CM

## 2021-08-12 DIAGNOSIS — J810 Acute pulmonary edema: Secondary | ICD-10-CM

## 2021-08-12 DIAGNOSIS — I132 Hypertensive heart and chronic kidney disease with heart failure and with stage 5 chronic kidney disease, or end stage renal disease: Secondary | ICD-10-CM | POA: Diagnosis present

## 2021-08-12 DIAGNOSIS — I160 Hypertensive urgency: Secondary | ICD-10-CM | POA: Diagnosis present

## 2021-08-12 DIAGNOSIS — Z7951 Long term (current) use of inhaled steroids: Secondary | ICD-10-CM

## 2021-08-12 DIAGNOSIS — I081 Rheumatic disorders of both mitral and tricuspid valves: Secondary | ICD-10-CM | POA: Diagnosis present

## 2021-08-12 LAB — CBC WITH DIFF
BASOPHIL #: <0.1 10*3/uL (ref <=0.20)
BASOPHIL %: 0 %
EOSINOPHIL #: <0.1 10*3/uL (ref <=0.50)
EOSINOPHIL %: 0 %
HCT: 25.1 % — ABNORMAL LOW (ref 34.8–46.0)
HGB: 8.1 g/dL — ABNORMAL LOW (ref 11.5–16.0)
IMMATURE GRANULOCYTE #: <0.1 10*3/uL (ref <0.10)
IMMATURE GRANULOCYTE %: 1 % (ref 0–1)
LYMPHOCYTE #: 1.18 10*3/uL (ref 1.00–4.80)
LYMPHOCYTE %: 13 %
MCH: 29.6 pg (ref 26.0–32.0)
MCHC: 32.3 g/dL (ref 31.0–35.5)
MCV: 91.6 fL (ref 78.0–100.0)
MONOCYTE #: 0.82 10*3/uL (ref 0.20–1.10)
MONOCYTE %: 9 %
MPV: 9.4 fL (ref 8.7–12.5)
NEUTROPHIL #: 6.75 10*3/uL (ref 1.50–7.70)
NEUTROPHIL %: 77 %
PLATELETS: 256 10*3/uL (ref 150–400)
RBC: 2.74 10*6/uL — ABNORMAL LOW (ref 3.85–5.22)
RDW-CV: 17.7 % — ABNORMAL HIGH (ref 11.5–15.5)
WBC: 8.8 10*3/uL (ref 3.7–11.0)

## 2021-08-12 LAB — BLOOD GAS W/ CO-OX, LYTES, LACTATE REFLEX
%FIO2 (VENOUS): 32 %
BASE EXCESS: 4.3 mmol/L — ABNORMAL HIGH (ref <=3.0)
BICARBONATE (VENOUS): 27.2 mmol/L — ABNORMAL HIGH (ref 22.0–26.0)
CARBOXYHEMOGLOBIN: 1.1 % (ref 0.0–2.5)
CHLORIDE: 95 mmol/L — ABNORMAL LOW (ref 101–111)
GLUCOSE: 90 mg/dL (ref 60–105)
HEMOGLOBIN: 14.6 g/dL (ref 12.0–18.0)
IONIZED CALCIUM: 1.15 mmol/L (ref 1.10–1.35)
LACTATE: 1.1 mmol/L (ref 0.0–1.3)
MET-HEMOGLOBIN: 1.2 % (ref 0.0–2.0)
O2 SATURATION (VENOUS): 70.6 %
O2CT: 12.1 % (ref 6.7–17.5)
OXYHEMOGLOBIN: 59.1 % (ref 40.0–80.0)
PCO2 (VENOUS): 61 mm/Hg (ref 41–51)
PH (VENOUS): 7.33 (ref 7.31–7.41)
PO2 (VENOUS): 40 mm/Hg (ref 35–50)
SODIUM: 128 mmol/L — ABNORMAL LOW (ref 137–145)
WHOLE BLOOD POTASSIUM: 5 mmol/L — ABNORMAL HIGH (ref 3.5–4.6)

## 2021-08-12 LAB — BASIC METABOLIC PANEL
ANION GAP: 16 mmol/L (ref 10–20)
ANION GAP: 15 mmol/L — ABNORMAL HIGH (ref 4–13)
BUN/CREA RATIO: 4 — ABNORMAL LOW (ref 6–22)
BUN/CREA RATIO: 4 — ABNORMAL LOW (ref 6–22)
BUN: 39 mg/dL — ABNORMAL HIGH (ref 7–25)
BUN: 43 mg/dL — ABNORMAL HIGH (ref 8–25)
CALCIUM: 10.4 mg/dL — ABNORMAL HIGH (ref 8.6–10.3)
CALCIUM: 9.6 mg/dL (ref 8.5–10.0)
CHLORIDE: 92 mmol/L — ABNORMAL LOW (ref 98–107)
CHLORIDE: 93 mmol/L — ABNORMAL LOW (ref 96–111)
CO2 TOTAL: 25 mmol/L (ref 21–31)
CO2 TOTAL: 28 mmol/L (ref 22–30)
CREATININE: 9.17 mg/dL — ABNORMAL HIGH (ref 0.60–1.30)
CREATININE: 10.92 mg/dL — ABNORMAL HIGH (ref 0.60–1.05)
ESTIMATED GFR: 5 mL/min/{1.73_m2} — ABNORMAL LOW (ref >59)
ESTIMATED GFR: 4 mL/min/BSA — ABNORMAL LOW (ref >=60)
GLUCOSE: 197 mg/dL — ABNORMAL HIGH (ref 74–109)
GLUCOSE: 84 mg/dL (ref 65–125)
OSMOLALITY, CALCULATED: 281 mOsm/kg (ref 270–290)
POTASSIUM: 4 mmol/L (ref 3.5–5.1)
POTASSIUM: 4.9 mmol/L (ref 3.5–5.1)
SODIUM: 133 mmol/L — ABNORMAL LOW (ref 136–145)
SODIUM: 136 mmol/L (ref 136–145)

## 2021-08-12 LAB — CBC
HCT: 27.2 % — ABNORMAL LOW (ref 37.0–47.0)
HGB: 9.1 g/dL — ABNORMAL LOW (ref 12.5–16.0)
MCH: 29.8 pg (ref 27.0–32.0)
MCHC: 33.4 g/dL (ref 32.0–36.0)
MCV: 89.3 fL (ref 78.0–99.0)
MPV: 6 fL — ABNORMAL LOW (ref 7.4–10.4)
PLATELETS: 273 10*3/uL (ref 140–440)
RBC: 3.05 10*6/uL — ABNORMAL LOW (ref 4.20–5.40)
RDW: 18.9 % — ABNORMAL HIGH (ref 11.6–14.8)
WBC: 8.4 10*3/uL (ref 4.0–10.5)
WBCS UNCORRECTED: 8.4 10*3/uL

## 2021-08-12 LAB — C-REACTIVE PROTEIN(CRP),INFLAMMATION: CRP INFLAMMATION: 19.1 mg/L — ABNORMAL HIGH (ref <8.0)

## 2021-08-12 LAB — BLOOD BANK HOLD TUBE

## 2021-08-12 LAB — ARTERIAL BLOOD GAS/LACTATE
%FIO2 (ARTERIAL): 32 %
BASE EXCESS (ARTERIAL): 3.7 mmol/L — ABNORMAL HIGH (ref <=2.0)
BICARBONATE (ARTERIAL): 27.2 mmol/L — ABNORMAL HIGH (ref 20.0–26.0)
CARBOXYHEMOGLOBIN: 2.1 % — ABNORMAL HIGH (ref <=1.5)
LACTATE: 0.6 mmol/L (ref <=4.0)
MET-HEMOGLOBIN: 0.1 % (ref <=2.0)
O2CT: 12.6 %
OXYHEMOGLOBIN: 94.3 % (ref 88.0–100.0)
PCO2 (ARTERIAL): 47 mm/Hg — ABNORMAL HIGH (ref 35–45)
PH (ARTERIAL): 7.4 (ref 7.35–7.45)
PO2 (ARTERIAL): 82 mm/Hg (ref 80–100)

## 2021-08-12 LAB — HEPATIC FUNCTION PANEL
ALBUMIN: 3.6 g/dL (ref 3.5–5.0)
ALKALINE PHOSPHATASE: 84 U/L (ref 40–110)
ALT (SGPT): 5 U/L — ABNORMAL LOW (ref 8–22)
AST (SGOT): 17 U/L (ref 8–45)
BILIRUBIN DIRECT: 0.2 mg/dL (ref 0.1–0.4)
BILIRUBIN TOTAL: 0.8 mg/dL (ref 0.3–1.3)
PROTEIN TOTAL: 8 g/dL (ref 6.4–8.3)

## 2021-08-12 LAB — ECG 12-LEAD
Atrial Rate: 69 {beats}/min
Calculated P Axis: 70 degrees
Calculated R Axis: 3 degrees
Calculated T Axis: 107 degrees
PR Interval: 220 ms
QRS Duration: 102 ms
QT Interval: 484 ms
QTC Calculation: 518 ms
Ventricular rate: 69 {beats}/min

## 2021-08-12 LAB — MAGNESIUM: MAGNESIUM: 2.1 mg/dL (ref 1.8–2.6)

## 2021-08-12 LAB — ECG 12 LEAD
Atrial Rate: 84 {beats}/min
Calculated P Axis: 78 degrees
Calculated R Axis: -23 degrees
Calculated T Axis: 93 degrees
PR Interval: 224 ms
QRS Duration: 100 ms
QT Interval: 400 ms
QTC Calculation: 472 ms
Ventricular rate: 84 {beats}/min

## 2021-08-12 LAB — TROPONIN-I
TROPONIN I: 100 ng/L — ABNORMAL HIGH (ref 0–30)
TROPONIN I: 91 ng/L — ABNORMAL HIGH (ref 0–30)

## 2021-08-12 LAB — GOLD TOP TUBE

## 2021-08-12 LAB — THYROID STIMULATING HORMONE WITH FREE T4 REFLEX: TSH: 1.427 u[IU]/mL (ref 0.430–3.550)

## 2021-08-12 LAB — MICRO HOLD

## 2021-08-12 LAB — B-TYPE NATRIURETIC PEPTIDE: BNP: 1719 pg/mL — ABNORMAL HIGH (ref <=100)

## 2021-08-12 LAB — PTT (PARTIAL THROMBOPLASTIN TIME): APTT: 225.3 seconds (ref 26.0–36.0)

## 2021-08-12 LAB — PHOSPHORUS: PHOSPHORUS: 8.6 mg/dL — ABNORMAL HIGH (ref 2.4–4.7)

## 2021-08-12 LAB — PROCALCITONIN ALGORITHM: PROCALCITONIN: 0.41 ng/mL (ref <0.50)

## 2021-08-12 LAB — GRAY TOP TUBE

## 2021-08-12 LAB — BLUE TOP TUBE

## 2021-08-12 MED ORDER — SODIUM CHLORIDE 0.9 % (FLUSH) INJECTION SYRINGE
2.0000 mL | INJECTION | Freq: Three times a day (TID) | INTRAMUSCULAR | Status: DC
Start: 2021-08-12 — End: 2021-08-19
  Administered 2021-08-12: 5 mL
  Administered 2021-08-12: 6 mL
  Administered 2021-08-13: 0 mL
  Administered 2021-08-13 (×2): 6 mL
  Administered 2021-08-14: 0 mL
  Administered 2021-08-14: 5 mL
  Administered 2021-08-14: 2 mL
  Administered 2021-08-15: 0 mL
  Administered 2021-08-15: 5 mL
  Administered 2021-08-15: 3 mL
  Administered 2021-08-16: 5 mL
  Administered 2021-08-16: 3 mL
  Administered 2021-08-16: 6 mL
  Administered 2021-08-17: 2 mL
  Administered 2021-08-17: 4 mL
  Administered 2021-08-17: 6 mL
  Administered 2021-08-18: 3 mL
  Administered 2021-08-18: 0 mL
  Administered 2021-08-18 – 2021-08-19 (×2): 2 mL
  Administered 2021-08-19: 6 mL

## 2021-08-12 MED ORDER — ALBUTEROL SULFATE 2.5 MG/3 ML (0.083 %) SOLUTION FOR NEBULIZATION
2.5000 mg | INHALATION_SOLUTION | RESPIRATORY_TRACT | Status: DC | PRN
Start: 2021-08-12 — End: 2021-08-13
  Administered 2021-08-12 – 2021-08-13 (×3): 2.5 mg via RESPIRATORY_TRACT
  Filled 2021-08-12 (×2): qty 3

## 2021-08-12 MED ORDER — LABETALOL 20 MG/4 ML (5 MG/ML) INTRAVENOUS SYRINGE
20.0000 mg | INJECTION | INTRAVENOUS | Status: DC | PRN
Start: 2021-08-12 — End: 2021-08-12
  Administered 2021-08-12: 20 mg via INTRAVENOUS
  Filled 2021-08-12: qty 4

## 2021-08-12 MED ORDER — IPRATROPIUM 0.5 MG-ALBUTEROL 3 MG (2.5 MG BASE)/3 ML NEBULIZATION SOLN
3.0000 mL | INHALATION_SOLUTION | Freq: Four times a day (QID) | RESPIRATORY_TRACT | Status: DC
Start: 2021-08-12 — End: 2021-08-12
  Filled 2021-08-12: qty 3

## 2021-08-12 MED ORDER — PANTOPRAZOLE 40 MG TABLET,DELAYED RELEASE
40.0000 mg | DELAYED_RELEASE_TABLET | Freq: Every day | ORAL | Status: DC
Start: 2021-08-12 — End: 2021-08-12

## 2021-08-12 MED ORDER — LABETALOL 200 MG TABLET
300.0000 mg | ORAL_TABLET | Freq: Two times a day (BID) | ORAL | Status: DC
Start: 2021-08-12 — End: 2021-08-12
  Administered 2021-08-12: 200 mg via ORAL
  Filled 2021-08-12: qty 2

## 2021-08-12 MED ORDER — DILTIAZEM CD 120 MG CAPSULE,EXTENDED RELEASE 24 HR
120.0000 mg | ORAL_CAPSULE | Freq: Every day | ORAL | Status: DC
Start: 2021-08-12 — End: 2021-08-13
  Administered 2021-08-12 – 2021-08-13 (×2): 120 mg via ORAL
  Filled 2021-08-12 (×2): qty 1

## 2021-08-12 MED ORDER — CLONIDINE 0.1 MG/24 HR WEEKLY TRANSDERMAL PATCH
0.3000 mg | MEDICATED_PATCH | TRANSDERMAL | Status: DC
Start: 2021-08-12 — End: 2021-08-12
  Administered 2021-08-12: 0.3 mg via TRANSDERMAL
  Filled 2021-08-12: qty 3

## 2021-08-12 MED ORDER — LORATADINE 10 MG TABLET
10.0000 mg | ORAL_TABLET | Freq: Every day | ORAL | Status: DC
Start: 2021-08-12 — End: 2021-08-12
  Administered 2021-08-12: 10 mg via ORAL
  Filled 2021-08-12: qty 1

## 2021-08-12 MED ORDER — DILTIAZEM CD 120 MG CAPSULE,EXTENDED RELEASE 24 HR
120.0000 mg | ORAL_CAPSULE | Freq: Every day | ORAL | Status: DC
Start: 2021-08-12 — End: 2021-08-12

## 2021-08-12 MED ORDER — SENNOSIDES 8.6 MG-DOCUSATE SODIUM 50 MG TABLET
1.0000 | ORAL_TABLET | Freq: Two times a day (BID) | ORAL | Status: DC
Start: 2021-08-12 — End: 2021-08-19
  Administered 2021-08-12 – 2021-08-14 (×4): 1 via ORAL
  Administered 2021-08-14: 0 via ORAL
  Administered 2021-08-15 – 2021-08-16 (×4): 1 via ORAL
  Administered 2021-08-17: 0 via ORAL
  Administered 2021-08-17 – 2021-08-18 (×2): 1 via ORAL
  Administered 2021-08-18 – 2021-08-19 (×2): 0 via ORAL
  Filled 2021-08-12 (×13): qty 1

## 2021-08-12 MED ORDER — ALBUTEROL SULFATE 2.5 MG/3 ML (0.083 %) SOLUTION FOR NEBULIZATION
2.5000 mg | INHALATION_SOLUTION | Freq: Four times a day (QID) | RESPIRATORY_TRACT | Status: DC
Start: 2021-08-12 — End: 2021-08-12
  Administered 2021-08-12: 2.5 mg via RESPIRATORY_TRACT
  Filled 2021-08-12: qty 3

## 2021-08-12 MED ORDER — LABETALOL 200 MG TABLET
200.0000 mg | ORAL_TABLET | Freq: Two times a day (BID) | ORAL | Status: DC
Start: 2021-08-12 — End: 2021-08-12
  Administered 2021-08-12: 0 mg via ORAL
  Filled 2021-08-12: qty 1

## 2021-08-12 MED ORDER — SODIUM CHLORIDE 0.9 % (FLUSH) INJECTION SYRINGE
2.0000 mL | INJECTION | INTRAMUSCULAR | Status: DC | PRN
Start: 2021-08-12 — End: 2021-08-19

## 2021-08-12 MED ORDER — NICOTINE 14 MG/24 HR DAILY TRANSDERMAL PATCH
14.0000 mg | MEDICATED_PATCH | Freq: Every day | TRANSDERMAL | Status: DC
Start: 2021-08-12 — End: 2021-08-19
  Administered 2021-08-12 – 2021-08-18 (×7): 14 mg via TRANSDERMAL
  Administered 2021-08-19: 0 mg via TRANSDERMAL
  Filled 2021-08-12 (×8): qty 1

## 2021-08-12 MED ORDER — BUDESONIDE-FORMOTEROL HFA 160 MCG-4.5 MCG/ACTUATION AEROSOL INHALER
2.0000 | INHALATION_SPRAY | Freq: Two times a day (BID) | RESPIRATORY_TRACT | Status: DC
Start: 2021-08-12 — End: 2021-08-12

## 2021-08-12 MED ORDER — ACETAMINOPHEN 325 MG TABLET
650.0000 mg | ORAL_TABLET | ORAL | Status: DC | PRN
Start: 2021-08-12 — End: 2021-08-19
  Administered 2021-08-12 – 2021-08-13 (×2): 650 mg via ORAL
  Filled 2021-08-12 (×2): qty 2

## 2021-08-12 MED ORDER — SODIUM CHLORIDE 0.9 % INJECTION SOLUTION
2.0000 mL | INTRAVENOUS | Status: AC
Start: 2021-08-12 — End: 2021-08-14
  Administered 2021-08-14: 2 mL via INTRAVENOUS

## 2021-08-12 MED ORDER — SODIUM CHLORIDE 0.9% FLUSH BAG - 250 ML
INTRAVENOUS | Status: DC | PRN
Start: 2021-08-12 — End: 2021-08-19

## 2021-08-12 MED ORDER — DOXAZOSIN 4 MG TABLET
4.0000 mg | ORAL_TABLET | Freq: Every evening | ORAL | Status: DC
Start: 2021-08-12 — End: 2021-08-12
  Administered 2021-08-12: 4 mg via ORAL
  Filled 2021-08-12: qty 1

## 2021-08-12 MED ORDER — LABETALOL 200 MG TABLET
300.0000 mg | ORAL_TABLET | Freq: Two times a day (BID) | ORAL | Status: DC
Start: 2021-08-12 — End: 2021-08-12

## 2021-08-12 MED ORDER — ASPIRIN 81 MG CHEWABLE TABLET
81.0000 mg | CHEWABLE_TABLET | Freq: Every day | ORAL | Status: DC
Start: 2021-08-12 — End: 2021-08-12
  Administered 2021-08-12: 81 mg via ORAL
  Filled 2021-08-12: qty 1

## 2021-08-12 MED ORDER — ASPIRIN 81 MG TABLET,DELAYED RELEASE
81.0000 mg | DELAYED_RELEASE_TABLET | Freq: Every day | ORAL | Status: DC
Start: 2021-08-13 — End: 2021-08-14
  Administered 2021-08-13 – 2021-08-14 (×2): 81 mg via ORAL
  Filled 2021-08-12 (×2): qty 1

## 2021-08-12 MED ORDER — PANTOPRAZOLE 40 MG TABLET,DELAYED RELEASE
40.0000 mg | DELAYED_RELEASE_TABLET | Freq: Every day | ORAL | Status: DC
Start: 2021-08-13 — End: 2021-08-19
  Administered 2021-08-13 – 2021-08-19 (×7): 40 mg via ORAL
  Filled 2021-08-12 (×7): qty 1

## 2021-08-12 MED ORDER — HEPARIN (PORCINE) 5,000 UNITS/ML BOLUS
80.0000 [IU]/kg | Freq: Once | INTRAMUSCULAR | Status: AC
Start: 2021-08-12 — End: 2021-08-12
  Administered 2021-08-12: 5500 [IU] via INTRAVENOUS
  Filled 2021-08-12: qty 2

## 2021-08-12 MED ORDER — ALBUTEROL SULFATE HFA 90 MCG/ACTUATION AEROSOL INHALER
2.0000 | INHALATION_SPRAY | Freq: Four times a day (QID) | RESPIRATORY_TRACT | Status: DC | PRN
Start: 2021-08-12 — End: 2021-08-12

## 2021-08-12 MED ORDER — HEPARIN (PORCINE) 25,000 UNIT/250 ML IN 0.45 % SODIUM CHLORIDE IV SOLN
18.0000 [IU]/kg/h | INTRAVENOUS | Status: DC
Start: 2021-08-12 — End: 2021-08-15
  Administered 2021-08-12 (×2): 18 [IU]/kg/h via INTRAVENOUS
  Administered 2021-08-13: 14 [IU]/kg/h via INTRAVENOUS
  Administered 2021-08-13: 16 [IU]/kg/h via INTRAVENOUS
  Administered 2021-08-13: 12 [IU]/kg/h via INTRAVENOUS
  Administered 2021-08-13: 0 [IU]/kg/h via INTRAVENOUS
  Administered 2021-08-13: 12 [IU]/kg/h via INTRAVENOUS
  Administered 2021-08-13 (×2): 13 [IU]/kg/h via INTRAVENOUS
  Administered 2021-08-14: 10 [IU]/kg/h via INTRAVENOUS
  Administered 2021-08-15: 0 [IU]/kg/h via INTRAVENOUS
  Administered 2021-08-15 (×2): 10 [IU]/kg/h via INTRAVENOUS
  Filled 2021-08-12 (×3): qty 250

## 2021-08-12 MED ORDER — CLONIDINE 0.2 MG/24 HR WEEKLY TRANSDERMAL PATCH
0.3000 mg | MEDICATED_PATCH | TRANSDERMAL | Status: DC
Start: 2021-08-12 — End: 2021-08-12
  Filled 2021-08-12: qty 1

## 2021-08-12 MED ORDER — DEXTROSE 5% IN WATER (D5W) FLUSH BAG - 250 ML
INTRAVENOUS | Status: DC | PRN
Start: 2021-08-12 — End: 2021-08-19

## 2021-08-12 MED ORDER — CLOPIDOGREL 75 MG TABLET
75.0000 mg | ORAL_TABLET | Freq: Every day | ORAL | Status: DC
Start: 2021-08-12 — End: 2021-08-12
  Administered 2021-08-12: 75 mg via ORAL
  Filled 2021-08-12: qty 1

## 2021-08-12 MED ORDER — BUDESONIDE 0.5 MG/2 ML SUSPENSION FOR NEBULIZATION
0.5000 mg | INHALATION_SUSPENSION | Freq: Two times a day (BID) | RESPIRATORY_TRACT | Status: DC
Start: 2021-08-12 — End: 2021-08-12
  Administered 2021-08-12: 0 mg via RESPIRATORY_TRACT

## 2021-08-12 MED ORDER — MONTELUKAST 10 MG TABLET
10.0000 mg | ORAL_TABLET | Freq: Every evening | ORAL | Status: DC
Start: 2021-08-12 — End: 2021-08-12
  Administered 2021-08-12: 10 mg via ORAL
  Filled 2021-08-12: qty 1

## 2021-08-12 MED ORDER — ETHYL ALCOHOL 62 % (NOZIN NASAL SANITIZER) NASAL SOLUTION - BULK BOTTLE
1.0000 | Freq: Two times a day (BID) | NASAL | Status: DC
Start: 2021-08-12 — End: 2021-08-12
  Administered 2021-08-12: 1 via NASAL

## 2021-08-12 MED ORDER — DILTIAZEM CD 120 MG CAPSULE,EXTENDED RELEASE 24 HR
120.0000 mg | ORAL_CAPSULE | Freq: Every day | ORAL | Status: DC
Start: 2021-08-12 — End: 2021-08-12
  Administered 2021-08-12: 120 mg via ORAL
  Filled 2021-08-12: qty 1

## 2021-08-12 MED ORDER — BUDESONIDE 0.5 MG/2 ML SUSPENSION FOR NEBULIZATION
INHALATION_SUSPENSION | RESPIRATORY_TRACT | Status: AC
Start: 2021-08-12 — End: 2021-08-12
  Administered 2021-08-12: 0.5 mg via RESPIRATORY_TRACT
  Filled 2021-08-12: qty 2

## 2021-08-12 MED ORDER — CLONIDINE 0.2 MG/24 HR WEEKLY TRANSDERMAL PATCH
0.4000 mg | MEDICATED_PATCH | TRANSDERMAL | Status: DC
Start: 2021-08-12 — End: 2021-08-12
  Administered 2021-08-12: 0.4 mg via TRANSDERMAL
  Filled 2021-08-12: qty 2

## 2021-08-12 MED ORDER — ISOSORBIDE MONONITRATE ER 60 MG TABLET,EXTENDED RELEASE 24 HR
60.0000 mg | ORAL_TABLET | Freq: Every morning | ORAL | Status: DC
Start: 2021-08-12 — End: 2021-08-12
  Administered 2021-08-12: 60 mg via ORAL
  Filled 2021-08-12: qty 1

## 2021-08-12 MED ORDER — POLYETHYLENE GLYCOL 3350 17 GRAM ORAL POWDER PACKET
17.0000 g | Freq: Every day | ORAL | Status: DC
Start: 2021-08-12 — End: 2021-08-19
  Administered 2021-08-12: 0 g via ORAL
  Administered 2021-08-13: 17 g via ORAL
  Administered 2021-08-14: 0 g via ORAL
  Administered 2021-08-15 – 2021-08-16 (×2): 17 g via ORAL
  Administered 2021-08-17: 0 g via ORAL
  Administered 2021-08-18: 17 g via ORAL
  Administered 2021-08-19: 0 g via ORAL
  Filled 2021-08-12 (×6): qty 1

## 2021-08-12 MED ORDER — CINACALCET 30 MG TABLET
30.0000 mg | ORAL_TABLET | Freq: Every evening | ORAL | Status: DC
Start: 2021-08-12 — End: 2021-08-12
  Administered 2021-08-12: 30 mg via ORAL
  Filled 2021-08-12: qty 1

## 2021-08-12 MED ORDER — PANTOPRAZOLE 40 MG TABLET,DELAYED RELEASE
40.0000 mg | DELAYED_RELEASE_TABLET | Freq: Every day | ORAL | Status: DC
Start: 2021-08-12 — End: 2021-08-12
  Administered 2021-08-12: 40 mg via ORAL
  Filled 2021-08-12: qty 1

## 2021-08-12 MED ORDER — DILTIAZEM CD 120 MG CAPSULE,EXTENDED RELEASE 24 HR
120.0000 mg | ORAL_CAPSULE | Freq: Every day | ORAL | Status: DC
Start: 2021-08-13 — End: 2021-08-12

## 2021-08-12 MED ORDER — BUDESONIDE-FORMOTEROL HFA 160 MCG-4.5 MCG/ACTUATION AEROSOL INHALER
2.0000 | INHALATION_SPRAY | Freq: Two times a day (BID) | RESPIRATORY_TRACT | Status: DC
Start: 2021-08-12 — End: 2021-08-19
  Administered 2021-08-12 – 2021-08-14 (×4): 2 via RESPIRATORY_TRACT
  Administered 2021-08-14: 0 via RESPIRATORY_TRACT
  Administered 2021-08-15: 2 via RESPIRATORY_TRACT
  Administered 2021-08-15: 0 via RESPIRATORY_TRACT
  Administered 2021-08-16 – 2021-08-19 (×7): 2 via RESPIRATORY_TRACT
  Filled 2021-08-12 (×2): qty 6

## 2021-08-12 MED ORDER — FUROSEMIDE 40 MG TABLET
80.0000 mg | ORAL_TABLET | Freq: Two times a day (BID) | ORAL | Status: DC
Start: 2021-08-12 — End: 2021-08-12
  Administered 2021-08-12: 80 mg via ORAL
  Filled 2021-08-12: qty 2

## 2021-08-12 NOTE — Nurses Notes (Signed)
Patient taken by [facility] to be transported to [facility].

## 2021-08-12 NOTE — Care Plan (Signed)
Patient is admitted to [facility] at this time for fluid overload. Patient received dialysis last night and is following with nephrology. Patient had a CTA which showed a low attenuation thrombus in the distal SVC. Patient is alert and oriented at this time but lethargic. Lab work is being monitored as ordered. Patient has swelling to face and has upper airway wheezing. Plan is to transfer patient to [facility] MICU at this time. Education and transition readiness is ongoing.  Problem: Adult Inpatient Plan of Care  Goal: Patient-Specific Goal (Individualized)  Outcome: Ongoing (see interventions/notes)  Flowsheets (Taken 08/12/2021 0046)  Patient would like to participate in bedside shift report: Yes  Individualized Care Needs: monitor vitals and lab values  Anxieties, Fears or Concerns: anxious of current condition  Patient-Specific Goals (Include Timeframe): wanting to go home  Plan of Care Reviewed With: patient

## 2021-08-12 NOTE — H&P (Signed)
Medical Intensive Care Unit  HISTORY & PHYSICAL     Name: [name], [name], 46 y.o. female Date of Service:  08/12/2021   MRN  Date of Birth:  1975-08-25  [id number] Attending: Vevelyn Royals*   PCP: Cam Hai, FNP Chief Complaint: had no chief complaint listed for this encounter.   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    She reported on arrival to outside ED on 06/18 that her last HD treatment was Wednesday, 08/07/2021 since she was unable to obtain transport to her HD treatments and on further evaluation she was found to have pulmonary edema on chest x-ray, a BNP of greater than 35,000, potassium of 6.5, and a creatinine of 14.06.  She was additionally found to have severely uncontrolled hypertension at that time and was transferred to the ICU.  Initially in the ICU, she was noted to be obtunded, with mild stridor, and significant swelling of the face, head, neck, and arms.  There was concern for SVC syndrome at that time so the patient was sent for stat chest CTA.  CTA revealed near totally occlusive distal SVC thrombus associated with the tip of the patient's dialysis catheter.  She underwent urgent hemodialysis and was started on a nicardipine drip for her blood pressure.  Her mentation and respiratory status improved after her dialysis treatments.  She was subsequently started on a heparin drip for her SVC thrombus.  Since [facility] does not have vascular surgery at their facility, she was transferred to [facility] for  further evaluation our MICU    Of note she has had similar presentations in the past for which she had a similar problem in the past for which she had SVC syndrome and underwent balloon angioplasty by vascular surgery at [facility].  (September 2022, Dr. Kallie Edward)    On questioning in the [facility] MICU, the patient is stable and alert and oriented x3.  She is coughing and pleasant to talk with.  Denies fevers, chills, chest pain, worsening shortness of breath, wheezing, abdominal pain, dysuria, hematuria, confusion.  She had a peripheral IV placed in her radial artery at outside facility.  This was removed as she is in no need of an arterial line at this time.       MEDICAL HISTORY:     PAST MEDICAL & SURGICAL HISTORIES:   Past Medical History:   Diagnosis Date   . Asthma    . Chronic diastolic CHF (congestive heart failure) (CMS HCC)    . Esophageal reflux    . ESRD (end stage renal disease) (CMS [place])    . History of anemia due to CKD    . Hypertension    . MI (myocardial infarction) (CMS [place])    . Mitral valve regurgitation    . Pulmonary edema    . Sleep apnea    . Tricuspid valve regurgitation     Past Surgical History:   Procedure Laterality Date   . ESOPHAGOGASTRODUODENOSCOPY     . HX BACK SURGERY     .  HX CHOLECYSTECTOMY     . HX FOOT SURGERY Right    . HX HYSTERECTOMY     . HX TONSILLECTOMY        HOME MEDICATIONS:  No outpatient medications have been marked as taking for the 08/12/21 encounter [facility] Encounter).      ALLERGIES:  She is allergic to lisinopril.      FAMILY HISTORY:  Her family history includes Breast Cancer in her mother; Coronary Artery Disease in her mother; Diabetes type II in her mother; Hypertension (High Blood Pressure) in her father.   SOCIAL HISTORY:  She  reports that she does not currently use alcohol. She  reports that she has been smoking cigarettes. She has been smoking an average of .25 packs per day. She has never used smokeless tobacco. She  reports current drug use.  Drug: Marijuana.     ROS:      Positive are bolded  Constitutional: fevers, chills, diaphoresis, fatigue, malaise, and weight changes  ENT: hearing loss, sinus congestion, dysphagia, or odynophagia   Respiratory: cough, sputum, hemoptysis, or dyspnea  Cardiovascular:  chest pain, palpitations, lower extremity edema, or orthopnea   Gastrointestinal:  nausea, vomiting, diarrhea, melena, hematochezia, hematemesis, dyspepsia, or change in bowel habits  Genitourinary:  urinary retention, dysuria, or hematuria  Integumentary:  rash, pruritis, or lesions  Hematologic/lymphatic:  bruising and lymphadenopathy   Musculoskeletal: back pain, myalgias, arthralgias, or joint effusions  Neurological: headaches, dizziness, or weakness  Behavioral/Psych: anxiety, depression, or sleep disturbance  Endocrine: polyuria, polydipsia, or temperature intolerances   Allergic/Immunologic: urticaria or anaphylaxis        OBJECTIVE:    VITAL SIGNS:  Temperature: 36.7 C (98.1 F)                    INTAKE & OUTPUT:  Net Since Admission:   No intake or output data in the 24 hours ending 08/12/21 1638            VENTILATOR SETTINGS:  Ventilator Settings:  Not on ventilator             LINES & DRAINS:   Patient Lines/Drains/Airways Status     Active Line / Dialysis Catheter / Dialysis Graft / Drain / Airway / Wound     Name Placement date Placement time Site Days    Peripheral IV Left;Proximal Basilic  (medial side of arm) 08/12/21  0316  -- less than 1    AV Fistula Left;Upper Arm 12/05/20  1502  -- 250    Dialysis Catheter 12/05/20  1503  -- 250                 Physical Exam  General: appears chronically ill and in no acute distress, resting in bed.  Diffuse upper extremity and face edema  Eyes: Conjunctiva clear, Pupils equal and round, Sclera non-icteric  HENT: Head atraumatic and normocephalic, Mouth mucous membranes dry.   Neck: Trachea Midline, no stridor appreciated   Lungs:  Diffuse rhonchi noticed and appreciated, bilateral wheezes  appreciated  Cardiovascular: Regular rate and rhythm, no murmur, click, rub or gallop  Abdomen: Soft, non-tender, Bowel sounds normal, non-distended  Extremities: No cyanosis or edema  Skin: Skin warm and dry  Neurologic: Alert and oriented x3    LABS  IMAGING  MICROBIOLOGY  PATHOLOGY:     I have reviewed all of the recent labs, imaging, microbiology and pathology.    CBC Differential   Recent Labs     08/11/21  1330 08/11/21  2220 08/12/21  0508   WBC 13.4* 9.4 8.4   HGB 6.0* 9.6* 9.1*   HCT 18.6* 27.9* 27.2*   PLTCNT 369 297 273    Recent Labs     08/11/21  1330   PMNS 83*   LYMPHOCYTES 8*   MONOCYTES 7   EOSINOPHIL 2   BASOPHILS 0  0.06   PMNABS 11.11*   LYMPHSABS 1.06*   MONOSABS 0.92   EOSABS 0.25      BMP LFTs   Recent Labs     08/11/21  2216 08/12/21  0508   SODIUM 131* 133*   POTASSIUM 6.8* 4.0   CHLORIDE 93* 92*   CO2 21 25   BUN 73* 39*   CREATININE 14.10* 9.17*   GLUCOSENF 113* 197*   ANIONGAP 17 16   BUNCRRATIO 5* 4*   GFR 3* 5*   CALCIUM 10.6* 10.4*    No results found for this encounter   CoAgs Blood Gas:   Recent Labs     08/12/21  0916   APTT 225.3*    Recent Labs     08/12/21  0153   FI02 32   PH 7.40   PCO2 47*   PO2 82   BICARBONATE 27.2*   BASEEXCESS 3.7*       Cardiac Markers Lipid Panel   Recent Labs     08/11/21  1322 08/11/21  1512 08/11/21  1702   TROPONINI 56* 56* 58*    No results found for this encounter   Urine Analysis Other Labs   No results found for this encounter No results found for this encounter    Invalid input(s): PRL       ASSESSMENT:     Larry Knipp is a 46 year old female w/ a past medical history of 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      Active Hospital Problems    Diagnosis   . Primary Problem: SVC syndrome   . Hypertensive urgency       PLAN:     Respiratory:  Hx Asthma and COPD  Tobacco Use Disorder  . On 2 L nasal cannula saturating 97%  o Wean oxygen as tolerated  . Chest x-ray significant for mild pulmonary edema; repeat pending  . Incentive Spirometry   . 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 EKG with first-degree AV block  . Troponin peak to 58  . Restart home Cardizem 120 mg; patient could not recall additional meds as multiple were duplicate classes  . Interventional Radiology consulted, appreciate recommendations  o Plan for thrombectomy w/ stent placement and replacement of tunneled dialysis catheter on 6/20  o Heparin Thrombotic protocol  o NPO    Neurologic:  No acute abnormalities  . Alert and Oriented x3     Renal:  ESRD secondary to Hypertensive Nephropathy   M-W-F Dialysis   Nephrology Consulted, appreciate recommendations and help   Patient Anuric   . Creatinine 9.17 today  . Renally dose all medications and avoid Nephrotoxins      Hematology  Chronic Anemia  . At baseline, Hg 9.1  Infectious Diseases:  No acute concerns at this time  . WBC 8.4, CRP and Procal pending  . Blood cultures not sent  . No antibiotic coverage at this time     GI/FEN:  No acute abnormalities   . Protonix 40 mg     Endocrinologic:  No acute abnormalities   . TSH and A1c sent    ___     Diet: NPO for procedure   Fluids: None   Analgesia: None   Sedation: None   DVT/PE Prophylaxis: Heparin thrombotic protocol   Ulcer Prophylaxis: protonix   Glycemic Control: none   Bowel Regimen: Miralax/Senokot    Consults: IR and nephrology   Hardware (Lines, Drains, Foley, Tubes): PIV, R dialysis catheter    Activity: Up w/ assistance and Up in chair TID w/ all meals   Therapy: PT/OT       Disposition Planning - TBD    Mikki Santee, DO  PGY-1 Internal Medicine  [facility]

## 2021-08-12 NOTE — Progress Notes (Signed)
Paged by MARS line regarding transfer request to [facility] for interventional radiology services.    46 year old female with ESRD on HD with indwelling right IJ HD catheter with history of innominate vein occlusion. She underwent angioplasty of the innominate vein on 12/05/20. She presented to outside hospital on 6/18 with stridor and swelling of the face/head/arms/breasts. CTA demonstrated near-total occlusive distal SVC thrombus associated with the tip of HD catheter. Clinical picture consistent with SVC syndrome.    -Please page interventional radiology for formal consult when patient arrives to [facility]    Limmie Patricia, MD  Attending Physician, Interventional Radiology    08/12/2021, 06:11

## 2021-08-12 NOTE — Discharge Summary (Signed)
[facility]  DISCHARGE SUMMARY    PATIENT NAME:  [name], [name]  MRN:  [id number]  DOB:  [birth year]    ENCOUNTER DATE:  08/11/2021  INPATIENT ADMISSION DATE: 08/11/2021  DISCHARGE DATE:  08/12/2021    ATTENDING PHYSICIAN: Alvina Filbert, MD  SERVICE: PRN HOSPITALIST 1  PRIMARY CARE PHYSICIAN: Cam Hai, FNP       No lay caregiver identified.      PRIMARY DISCHARGE DIAGNOSIS: Fluid overload  Active Hospital Problems    Diagnosis Date Noted   . Principal Problem: Fluid overload [E87.70] 08/11/2021   . Hypertensive urgency [I16.0] 08/11/2021   . Facial swelling [R22.0] 08/11/2021   . ESRD (end stage renal disease) on dialysis (CMS HCC) [N18.6, Z99.2] 12/04/2020   . 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 Uncontrolled hypertension [I10] 12/04/2020      Resolved Hospital Problems   No resolved problems to display.     Active Non-Hospital Problems    Diagnosis Date Noted   . Dyspnea 08/05/2021   . Anemia in chronic kidney disease 08/05/2021   . Hypervolemia associated with renal insufficiency 08/05/2021   . Coronary artery disease involving native coronary artery 07/01/2021   . Hypertensive heart disease 07/01/2021   . LVH (left ventricular hypertrophy) 07/01/2021   . Mild aortic stenosis 07/01/2021   . Noncompliance 07/01/2021   . Elevated troponin I level 06/30/2021   . Nausea 06/30/2021   . Congestive heart failure (CMS HCC) 06/30/2021   . Chronic chest pain 06/27/2021   . Tobacco dependence 12/04/2020   . GERD (gastroesophageal reflux disease) 12/04/2020   . Insomnia 12/04/2020           Current Discharge Medication List      CONTINUE these medications - NO CHANGES were made during your visit.      Details   albuterol sulfate 90 mcg/actuation oral inhaler  Commonly known as: PROVENTIL or VENTOLIN or PROAIR   1-2 Puffs, Inhalation, EVERY 6 HOURS PRN  Refills: 0     amoxicillin-pot clavulanate 500-125 mg Tablet  Commonly known as: AUGMENTIN   1 Tablet, Oral, 2 TIMES DAILY, Take for 10 days; prescription was  filled 08-02-21  Refills: 0     aspirin 81 mg Tablet, Delayed Release (E.C.)  Commonly known as: ECOTRIN   81 mg, Oral, DAILY  Refills: 0     AURYXIA ORAL   1 g, Oral, 3 TIMES DAILY BEFORE MEALS, "Take 2 tablets three times daily before meals and 1 tablet before snacks"  Refills: 0     budesonide-formoteroL 160-4.5 mcg/actuation oral inhaler  Commonly known as: SYMBICORT   2 Puffs, Inhalation, 2 TIMES DAILY  Refills: 0     cinacalcet 30 mg Tablet  Commonly known as: SENSIPAR   30 mg, Oral, EVERY EVENING  Refills: 0     cloNIDine 0.3 mg/24 hr Patch Weekly  Commonly known as: CATAPRES-TTS   0.3 mg, Transdermal, EVERY 7 DAYS  Refills: 0     clopidogreL 75 mg Tablet  Commonly known as: PLAVIX   75 mg, Oral, DAILY  Refills: 0     cyclobenzaprine 5 mg Tablet  Commonly known as: FLEXERIL   5 mg, Oral, 2 TIMES DAILY PRN  Refills: 0     dilTIAZem 120 mg Capsule, Sust. Release 24 hr  Commonly known as: CARDIZEM CD   120 mg, Oral, DAILY  Refills: 0     doxazosin 4 mg Tablet  Commonly known as: CARDURA   4  mg, Oral, EVERY EVENING  Refills: 0     furosemide 80 mg Tablet  Commonly known as: LASIX   80 mg, Oral, 2 TIMES DAILY  Refills: 0     guaiFENesin 600 mg Tablet Extended Release 12hr  Commonly known as: MUCINEX   600 mg, Oral, EVERY 12 HOURS  Refills: 0     isosorbide mononitrate 60 mg Tablet Sustained Release 24 hr  Commonly known as: IMDUR   60 mg, Oral, EVERY MORNING  Refills: 0     labetaloL 200 mg Tablet  Commonly known as: NORMODYNE   200 mg, Oral, 2 TIMES DAILY  Refills: 0     loratadine 10 mg Tablet  Commonly known as: CLARITIN   10 mg, Oral, DAILY  Refills: 0     montelukast 10 mg Tablet  Commonly known as: SINGULAIR   10 mg, Oral, EVERY EVENING  Refills: 0     nitroGLYCERIN 0.4 mg Tablet, Sublingual  Commonly known as: NITROSTAT   0.4 mg, Sublingual, EVERY 5 MIN, for 3 doses over 15 minutes  Qty: 3 Tablet  Refills: 0     ondansetron 4 mg Tablet  Commonly known as: ZOFRAN   4 mg, Oral, EVERY 12 HOURS PRN  Refills: 0      pantoprazole 40 mg Tablet, Delayed Release (E.C.)  Commonly known as: PROTONIX   40 mg, Oral, DAILY  Refills: 0          Discharge med list refreshed?  YES     Allergies   Allergen Reactions   . Lisinopril Swelling     Tongue and throat swelling     HOSPITAL PROCEDURE(S):   No orders of the defined types were placed in this encounter.      REASON FOR HOSPITALIZATION AND HOSPITAL COURSE   BRIEF HPI:  This is a 46 y.o., female admitted for fluid overload and shortness of breath on 08/11/21 from [facility] ED. She has history of ESRD on HD, last HD 08/07/21.    BRIEF HOSPITAL NARRATIVE:    [name] is a 46 year old female who was admitted 08/11/21 from [facility] ED with fluid overload and missed hemodialysis treatment. 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 She is usually on HD on MWF schedule; she reports last HD treatment was Wednesday, 08/07/21. She was found to have possible pulmonary edema on CXR. Pro-BNP >35000. She was also found to have severely uncontrolled hypertension. She was transferred to our facility to ICU.    On my initial evaluation, she was noted to be obtunded, with mild stridor, and significant swelling of face, head, neck, arms, and breasts. I had immediate concern for SVC syndrome, and sent patient for stat chest CTA. CTA revealed near-totally occlusive distal SVC thrombus associated with tip of dialysis catheter, swelling of chest/arms, all consistent with SVC syndrome. She was hyperkalemic at 6.8 on initial arrival; repeat potassium WNL at 4. She underwent urgent hemodialysis. She was also started on nicardipine drip for BP reduction. Mentation and respiratory status improved after hemodialysis. She was started on heparin drip for SVC thrombus. I discussed findings with patient. She reports that she had similar problem few months ago. Review of records reveals that she had innominate vein stenosis causing SVC syndrome, underwent balloon angioplasty by  vascular surgeon at [facility] in September 2022 (Dr. Kallie Edward). Discussed case with multiple providers at [facility] via transfer line, as we do not have vascular surgery service at our facility. She has  ultimately been accepted to service of Dr. Harriet Pho, MICU intensivist, by Dr. Marita Kansas, MICU fellow. We will transfer via critical care transport ground crew when bed available, continuing both heparin and nicardipine drips.    TRANSITION/POST DISCHARGE CARE/PENDING TESTS/REFERRALS:     CONDITION ON DISCHARGE:  A. Ambulation: Full ambulation  B. Self-care Ability: Complete  C. Cognitive Status Alert  D. Code status at discharge:       LINES/DRAINS/WOUNDS AT DISCHARGE:   Patient Lines/Drains/Airways Status     Active Line / Dialysis Catheter / Dialysis Graft / Drain / Airway / Wound     Name Placement date Placement time Site Days    Peripheral IV Left Cephalic  (lateral side of arm) 08/11/21  1202  -- less than 1    Peripheral IV Left;Proximal Basilic  (medial side of arm) 08/12/21  0316  -- less than 1    AV Fistula Left;Upper Arm 12/05/20  1502  -- 249    Dialysis Catheter 12/05/20  1503  -- 249                DISCHARGE DISPOSITION:  Transfer to acute care facility.  The patient is being transferred to [facility] due to SVC thrombus with SVC syndrome, evaluation by vascular surgery.  The benefits of the transfer outweigh the risks, and the patient has consented to the transfer.  The accepting facility has available space, time, and capacities to perform vascular surgery evaluation.    [facility] has verified the capability at the accepting facility.  The nearest facility for the procedure was discussed with the patient.  Consideration for alternative procedures that may be performed at [facility] was considered.  DISCHARGE INSTRUCTIONS:  Post-Discharge Follow Up Appointments     Wednesday Aug 21, 2021    Return Patient Visit with Richardean Sale, MD at  8:00 AM    Nephrology, [place], [place]  [address]  [phone]        No discharge procedures on file.       Tivis Ringer, FNP-BC    Copies sent to Care Team       Relationship Specialty Notifications Start End    Deel, Leona Carry, FNP PCP - General NURSE PRACTITIONER  12/04/20     Phone: [phone] Fax: [phone]         [address] [place] [address]          Referring providers can utilize [email or web address] to access their referred [place] patient's information.

## 2021-08-12 NOTE — Consults (Signed)
[facility] CONSULT      Patient Name: [name]  Patient MRN: [id number]  Patient DOB: January 12, 1976  Date of Service:08/12/2021  Admission Dx: SVC syndrome    Consult Requested By: Carney Harder    HPI  The patient is a 46 y.o. 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  Patient has been experiencing increased swelling of her face, neck, bilateral upper extremities, and breast for approximately 1 month.  She distally reports increased work of breathing.  Pertinent Imaging reveals: CTA Chest 08/11/21 - near complete occlusion of the superior vena cava with thrombus adjacent to the central venous catheter tip.    VITALS          Temp 36.7 C (98.1 F)         LABS    CBC Differential   Recent Labs     08/11/21  1330 08/11/21  2220 08/12/21  0508   WBC 13.4* 9.4 8.4   HGB 6.0* 9.6* 9.1*   HCT 18.6* 27.9* 27.2*   PLTCNT 369 297 273    Recent Labs     08/11/21  1330   PMNS 83*   LYMPHOCYTES 8*   MONOCYTES 7   EOSINOPHIL 2   BASOPHILS 0  0.06   PMNABS 11.11*   LYMPHSABS 1.06*   MONOSABS 0.92   EOSABS 0.25      BMP LFTs   Recent Labs     08/11/21  2216 08/12/21  0508   SODIUM 131* 133*   POTASSIUM 6.8* 4.0   CHLORIDE 93* 92*   CO2 21 25   BUN 73* 39*   CREATININE 14.10* 9.17*   GLUCOSENF 113* 197*   ANIONGAP 17 16   BUNCRRATIO 5* 4*   GFR 3* 5*   CALCIUM 10.6* 10.4*    No results found for this encounter   CoAgs Cardiac Markers   Recent Labs     08/12/21  0916   APTT 225.3*    Recent Labs     08/11/21  1322 08/11/21  1512 08/11/21  1702   TROPONINI 56* 56* 58*      Urine Analysis Other Labs   No results found for this encounter No results found for this encounter    Invalid input(s): PRL     PMH  Past Medical History:   Diagnosis Date   . Asthma    . Chronic diastolic CHF (congestive heart failure) (CMS HCC)    . Esophageal reflux     . ESRD (end stage renal disease) (CMS [place])    . History of anemia due to CKD    . Hypertension    . MI (myocardial infarction) (CMS [place])    . Mitral valve regurgitation    . Pulmonary edema    . Sleep apnea    . Tricuspid valve regurgitation          PSH     Past Surgical History:   Procedure Laterality Date   . ESOPHAGOGASTRODUODENOSCOPY     . HX BACK SURGERY     . HX CHOLECYSTECTOMY     . HX FOOT SURGERY Right    . HX HYSTERECTOMY     . HX TONSILLECTOMY               FAMILY HISTORY:    Her family history  includes Breast Cancer in her mother; Coronary Artery Disease in her mother; Diabetes type II in her mother; Hypertension (High Blood Pressure) in her father.     SOCIAL HISTORY:    She  reports that she does not currently use alcohol. She  reports that she has been smoking cigarettes. She has been smoking an average of .25 packs per day. She has never used smokeless tobacco. She  reports current drug use. Drug: Marijuana.     OUTPATIENT MEDICATIONS  Medications Prior to Admission     Prescriptions    albuterol sulfate (PROVENTIL OR VENTOLIN OR PROAIR) 90 mcg/actuation Inhalation oral inhaler    Take 1-2 Puffs by inhalation Every 6 hours as needed    amoxicillin-pot clavulanate (AUGMENTIN) 500-125 mg Oral Tablet    Take 1 Tablet by mouth Twice daily Take for 10 days; prescription was filled 08-02-21    aspirin (ECOTRIN) 81 mg Oral Tablet, Delayed Release (E.C.)    Take 1 Tablet (81 mg total) by mouth Once a day    budesonide-formoteroL (SYMBICORT) 160-4.5 mcg/actuation Inhalation oral inhaler    Take 2 Puffs by inhalation Twice daily    cinacalcet (SENSIPAR) 30 mg Oral Tablet    Take 1 Tablet (30 mg total) by mouth Every evening    cloNIDine (CATAPRES-TTS) 0.3 mg/24 hr Transdermal Patch Weekly    Place 1 Patch (0.3 mg total) on the skin Every 7 days    clopidogreL (PLAVIX) 75 mg Oral Tablet    Take 1 Tablet (75 mg total) by mouth Once a day    cyclobenzaprine (FLEXERIL) 5 mg Oral Tablet    Take 1 Tablet (5 mg  total) by mouth Twice per day as needed for Muscle spasms    dilTIAZem (CARDIZEM CD) 120 mg Oral Capsule, Sust. Release 24 hr    Take 1 Capsule (120 mg total) by mouth Once a day    doxazosin (CARDURA) 4 mg Oral Tablet    Take 1 Tablet (4 mg total) by mouth Every evening    ferric citrate (AURYXIA ORAL)    Take 1 g by mouth Three times daily before meals "Take 2 tablets three times daily before meals and 1 tablet before snacks"    furosemide (LASIX) 80 mg Oral Tablet    Take 1 Tablet (80 mg total) by mouth Twice daily    guaiFENesin (MUCINEX) 600 mg Oral Tablet Extended Release 12hr    Take 1 Tablet (600 mg total) by mouth Every 12 hours    isosorbide mononitrate (IMDUR) 60 mg Oral Tablet Sustained Release 24 hr    Take 1 Tablet (60 mg total) by mouth Every morning    labetaloL (NORMODYNE) 200 mg Oral Tablet    Take 1 Tablet (200 mg total) by mouth Twice daily    loratadine (CLARITIN) 10 mg Oral Tablet    Take 1 Tablet (10 mg total) by mouth Once a day    montelukast (SINGULAIR) 10 mg Oral Tablet    Take 1 Tablet (10 mg total) by mouth Every evening    nitroGLYCERIN (NITROSTAT) 0.4 mg Sublingual Tablet, Sublingual    Place 1 Tablet (0.4 mg total) under the tongue Every 5 minutes for 3 doses for 3 doses over 15 minutes    ondansetron (ZOFRAN) 4 mg Oral Tablet    Take 1 Tablet (4 mg total) by mouth Every 12 hours as needed for Nausea/Vomiting    pantoprazole (PROTONIX) 40 mg Oral Tablet, Delayed Release (E.C.)    Take 1 Tablet (  40 mg total) by mouth Once a day        INPATIENT MEDICATIONS  albuterol (PROVENTIL) 2.5 mg / 3 mL (0.083%) neb solution, 2.5 mg, Nebulization, Q4H PRN  D5W 250 mL flush bag, , Intravenous, Q15 Min PRN  dilTIAZem (CARDIZEM CD) 24 hr extended release capsule, 120 mg, Oral, Daily  heparin 25,000 units in 0.45% NS 250 mL infusion, 18 Units/kg/hr (Adjusted), Intravenous, Continuous  heparin 5,000 units/mL initial IV BOLUS, 80 Units/kg (Adjusted), Intravenous, Once  ipratropium-albuterol 0.5 mg-3  mg(2.5 mg base)/3 mL Solution for Nebulization, 3 mL, Nebulization, 4x/day  NS 250 mL flush bag, , Intravenous, Q15 Min PRN  NS flush syringe, 2-6 mL, Intracatheter, Q8HRS  NS flush syringe, 2-6 mL, Intracatheter, Q1 MIN PRN  perflutren lipid microspheres (DEFINITY) 1.3 mL in NS 10 mL (tot vol) injection, 2 mL, Intravenous, Give in Cardiology        MEDICATION ALLERGIES  Allergies   Allergen Reactions   . Lisinopril Swelling     Tongue and throat swelling       ROS      Constitutional: Positive for weight gain, fatigue. Negative for fevers, chills.  Eyes: Negative for change in vision, diplopia, or redness  ENT: Negative for hearing loss, sinus congestion, dysphagia, or odynophagia   Respiratory: Positive for cough, dyspnea. Negative for sputum or hemoptysis  Cardiovascular: Positive for upper extremity edema. Negative for chest pain, palpitations, lower extremity edema, or orthopnea   Gastrointestinal: Negative for nausea, vomiting, diarrhea, melena, hematochezia, hematemesis, dyspepsia, or change in bowel habits  Integumentary: Negative for rash, pruritis, or lesions  Hematologic/lymphatic: Positive for bruising. Negative for lymphadenopathy.   Musculoskeletal: Negative for back pain, myalgias, arthralgias, or joint effusions  Neurological: Positive for headaches. Negative for dizziness or weakness  Endocrine: Negative for polyuria, polydipsia, or temperature intolerances       PHYSICAL EXAM    General: Alert and oriented, and in no acute distress  Eyes: Conjunctiva clear, Pupils equal and round, Sclera non-icteric  HEENT: Head atraumatic.  Soft tissue swelling of the head and neck is appreciated.    Lungs: SpO2 98% on 2L NC  Cardiovascular: Regular rate and rhythm  Extremities: 1+ edema of the bilateral upper extremities  Skin: Skin warm and dry        ASSESSMENT & PLAN  46 y.o. female with SVC syndrome secondary to near occlusive thrombus bordering dialysis catheter tip within the inferior SVC    1 - SVC  Syndrome   - Plan for thrombectomy with stent placement, replacement of tunneled dialysis catheter tomorrow, 08/13/21, time to be determined  - Maintain NPO  - Resume heparin thrombotic protocol.  May hold heparin gtt when patient is in transport to IR suite.    Discussed with IR attending and approved    Contact IR resident with patient status updates: Pager [phone]  Contact IR charge nurse for questions regarding scheduling: Phone [phone]    Jacky Kindle, MD  08/12/2021, 16:25

## 2021-08-12 NOTE — Nurses Notes (Signed)
Patient admitted to MICU SE 21 from [place]. Primary RN at bedside at arrival and primary service notified of arrival into room. Vital signs obtained and assessment completed, see doc flowsheet. Will follow current orders and await additional orders.

## 2021-08-12 NOTE — Consults (Signed)
[place]    NEPHROLOGY CONSULTATION NOTE       [name] 46 y.o. female [id number]   Date of Service: 08/12/2021    Date of Admission:  08/11/2021   PCP: Cam Hai, FNP Code Status:Full Code       Reason for Consultation:  ESRD, hyperkalemia, uncontrolled hypertension  HPI:   46 year old female with past medical history of end-stage renal disease on hemodialysis, atherosclerotic vascular disease, uncontrolled hypertension noncompliance with medical treatment right upper chest PermCath in place presented to the hospital with generalized weakness and fatigue.  Patient missed dialysis on Friday.  It was due to transportation issues.  Patient was complaining of swelling in her face she was also complaining of weakness.  It was noted that her potassium was 6.5 we except with the patient in the medical ICU to arrange for dialysis CT angiogram was performed indicated thrombus in the SVC please refer to the CT report for further information.  Patient had dialysis overnight her potassium improved.  Discussed with the patient the findings her CT and the importance of further evaluation and she needs heparin for now.      ROS:   Systematic review of 12 organ systems was negative except what mentioned in in the HPI.    ED medications:   Medications Administered in the ED   furosemide (LASIX) 10 mg/mL injection (60 mg Intravenous Given 08/11/21 1256)   dexamethasone (PF) 10 mg/mL injection (10 mg Intravenous Given 08/11/21 1256)   ipratropium-albuterol 0.5 mg-3 mg(2.5 mg base)/3 mL Solution for Nebulization (3 mL Nebulization Given 08/11/21 1216)   cefTRIAXone (ROCEPHIN) 1 g in NS 50 mL IVPB minibag (0 g Intravenous Stopped 08/11/21 1336)   azithromycin (ZITHROMAX) 500 mg in NS 250 mL IVPB (0 mg Intravenous Stopped 08/11/21 1356)   albuterol (PROVENTIL) 2.5 mg / 3 mL (0.083%) neb solution (has no administration in time range)   albuterol (PROVENTIL) 2.5 mg / 3 mL (0.083%) neb solution (has no  administration in time range)   albuterol (PROVENTIL) 2.5 mg / 3 mL (0.083%) neb solution (has no administration in time range)   sodium polystyrene sulfonate (KAYEXALATE) 15 g per 60 mL oral liquid - with sorbitol (has no administration in time range)   dextrose 50% (0.5 g/mL) injection - syringe (has no administration in time range)   insulin R human 100 units/mL injection (has no administration in time range)   calcium chloride 100 mg/mL injection (has no administration in time range)       PMHx:    Past Medical History:   Diagnosis Date   . Asthma    . Chronic diastolic CHF (congestive heart failure) (CMS HCC)    . Esophageal reflux    . ESRD (end stage renal disease) (CMS [place])    . History of anemia due to CKD    . Hypertension    . MI (myocardial infarction) (CMS [place])    . Mitral valve regurgitation    . Pulmonary edema    . Sleep apnea    . Tricuspid valve regurgitation         PSHx:   Past Surgical History:   Procedure Laterality Date   . ESOPHAGOGASTRODUODENOSCOPY     . HX BACK SURGERY     . HX CHOLECYSTECTOMY     . HX FOOT SURGERY Right    . HX HYSTERECTOMY     . HX TONSILLECTOMY  Allergies:    Allergies   Allergen Reactions   . Lisinopril Swelling     Tongue and throat swelling    Social History  Social History     Tobacco Use   . Smoking status: Every Day     Packs/day: 0.25     Types: Cigarettes   . Smokeless tobacco: Never   Vaping Use   . Vaping Use: Never used   Substance Use Topics   . Alcohol use: Not Currently   . Drug use: Yes     Types: Marijuana       Family History  Family Medical History:     Problem Relation (Age of Onset)    Breast Cancer Mother    Coronary Artery Disease Mother    Diabetes type II Mother    Hypertension (High Blood Pressure) Father             Home Meds:      Prior to Admission medications    Medication Sig Start Date End Date Taking? Authorizing Provider   albuterol sulfate (PROVENTIL OR VENTOLIN OR PROAIR) 90 mcg/actuation Inhalation oral inhaler Take 1-2 Puffs  by inhalation Every 6 hours as needed    Provider, Historical   amoxicillin-pot clavulanate (AUGMENTIN) 500-125 mg Oral Tablet Take 1 Tablet by mouth Twice daily Take for 10 days; prescription was filled 08-02-21    Provider, Historical   aspirin (ECOTRIN) 81 mg Oral Tablet, Delayed Release (E.C.) Take 1 Tablet (81 mg total) by mouth Once a day    Provider, Historical   budesonide-formoteroL (SYMBICORT) 160-4.5 mcg/actuation Inhalation oral inhaler Take 2 Puffs by inhalation Twice daily    Provider, Historical   cinacalcet (SENSIPAR) 30 mg Oral Tablet Take 1 Tablet (30 mg total) by mouth Every evening    Provider, Historical   cloNIDine (CATAPRES-TTS) 0.3 mg/24 hr Transdermal Patch Weekly Place 1 Patch (0.3 mg total) on the skin Every 7 days 08/02/21   Provider, Historical   clopidogreL (PLAVIX) 75 mg Oral Tablet Take 1 Tablet (75 mg total) by mouth Once a day    Provider, Historical   cyclobenzaprine (FLEXERIL) 5 mg Oral Tablet Take 1 Tablet (5 mg total) by mouth Twice per day as needed for Muscle spasms    Provider, Historical   dilTIAZem (CARDIZEM CD) 120 mg Oral Capsule, Sust. Release 24 hr Take 1 Capsule (120 mg total) by mouth Once a day    Provider, Historical   doxazosin (CARDURA) 4 mg Oral Tablet Take 1 Tablet (4 mg total) by mouth Every evening    Provider, Historical   ferric citrate (AURYXIA ORAL) Take 1 g by mouth Three times daily before meals "Take 2 tablets three times daily before meals and 1 tablet before snacks"    Provider, Historical   furosemide (LASIX) 80 mg Oral Tablet Take 1 Tablet (80 mg total) by mouth Twice daily    Provider, Historical   guaiFENesin (MUCINEX) 600 mg Oral Tablet Extended Release 12hr Take 1 Tablet (600 mg total) by mouth Every 12 hours    Provider, Historical   isosorbide mononitrate (IMDUR) 60 mg Oral Tablet Sustained Release 24 hr Take 1 Tablet (60 mg total) by mouth Every morning    Provider, Historical   labetaloL (NORMODYNE) 200 mg Oral Tablet Take 1 Tablet (200 mg  total) by mouth Twice daily    Provider, Historical   loratadine (CLARITIN) 10 mg Oral Tablet Take 1 Tablet (10 mg total) by mouth Once a day  Provider, Historical   montelukast (SINGULAIR) 10 mg Oral Tablet Take 1 Tablet (10 mg total) by mouth Every evening    Provider, Historical   nitroGLYCERIN (NITROSTAT) 0.4 mg Sublingual Tablet, Sublingual Place 1 Tablet (0.4 mg total) under the tongue Every 5 minutes for 3 doses for 3 doses over 15 minutes 06/23/21 08/05/21  Elder, Gildardo Cranker, MD   ondansetron (ZOFRAN) 4 mg Oral Tablet Take 1 Tablet (4 mg total) by mouth Every 12 hours as needed for Nausea/Vomiting    Provider, Historical   pantoprazole (PROTONIX) 40 mg Oral Tablet, Delayed Release (E.C.) Take 1 Tablet (40 mg total) by mouth Once a day    Provider, Historical   carvediloL (COREG) 25 mg Oral Tablet Take 1 Tablet (25 mg total) by mouth Twice daily  08/06/21  Provider, Historical   cloNIDine HCL (CATAPRES) 0.3 mg Oral Tablet Take 1 Tablet (0.3 mg total) by mouth Three times a day  08/05/21  Provider, Historical   hydrOXYzine pamoate (VISTARIL) 25 mg Oral Capsule Take 1 Capsule (25 mg total) by mouth Every night as needed for Itching  Patient not taking: Reported on 08/05/2021  08/06/21  Provider, Historical   losartan (COZAAR) 50 mg Oral Tablet Take 1 Tablet (50 mg total) by mouth Once a day  Patient not taking: Reported on 08/05/2021  08/06/21  Provider, Historical   oxyCODONE-acetaminophen (PERCOCET) 5-325 mg Oral Tablet Take 1 Tablet by mouth Every 4 hours as needed  Patient not taking: Reported on 08/05/2021 12/13/20 08/06/21  Buena Irish, MD   polyethylene glycol (MIRALAX) 17 gram Oral Powder in Packet Take 1 Packet (17 g total) by mouth Once a day 12/14/20 08/05/21  Buena Irish, MD   sennosides-docusate sodium (SENOKOT-S) 8.6-50 mg Oral Tablet Take 1 Tablet by mouth Twice daily 12/13/20 08/05/21  Buena Irish, MD   spironolactone (ALDACTONE) 25 mg Oral Tablet Take 1 Tablet (25 mg total)  by mouth Twice daily  08/06/21  Provider, Historical          Current medications   No current facility-administered medications for this encounter.          Physical:  Filed Vitals:    08/12/21 0919 08/12/21 0930 08/12/21 1000 08/12/21 1030   BP: (!) 199/121 (!) 163/118 (!) 154/105 (!) 166/115   Pulse: (!) 108 98 92 86   Resp:  (!) 24 (!) 11 20   Temp:       SpO2:  99% 100% 97%        Intake/Output Summary (Last 24 hours) at 08/12/2021 1749  Last data filed at 08/12/2021 0600  Gross per 24 hour   Intake 748.02 ml   Output 0 ml   Net 748.02 ml        Patient is alert awake and oriented not in acute distress.  Normal mood and affect.  HEENT edema and upper chest head and neck.  Eye exam normal inspection.    Mucous membranes moist, no jaundice.  Neck exam no JVD normal inspection.  Cardiovascular system: Regular rate and rhythm no murmurs rubs or gallops. No chest wall tenderness  Lungs: Clear to auscultation bilaterally no wheezing no rhonchi.  Abdomen soft nontender nondistended.  Extremities no clubbing cyanosis or edema   Neuro exam: EOMI, normal speech    Labs:  CBC:     8.4 (06/19 0508) \   9.1* (06/19 6269) /   273 (06/19 4854)      / 27.2* (06/19 6270) \  BMP:   133* (06/19 0508) 92* (06/19 1700) 39* (06/19 1749)    /     197* (06/19 4496)   4.0 (06/19 7591) 25 (06/19 6384) 9.17* (06/19 6659) \                 Diagnostic studies:  Imaging:   ECG 12 LEAD  Sinus rhythm with 1st degree AV block  Possible Left atrial enlargement  Minimal voltage criteria for LVH, may be normal variant ( Cornell product )  Abnormal QRS-T angle, consider primary T wave abnormality  Abnormal ECG  When compared with ECG of 05-Aug-2021 02:45,  PR interval has increased  Confirmed by Rana, Javed ([id number]) on 08/12/2021 11:16:46 AM      Assessments:  Active Hospital Problems   (*Primary Problem)    Diagnosis   . *Fluid overload   . Hypertensive urgency   . Facial swelling   . ESRD (end stage renal disease) on dialysis (CMS HCC)   . SVC  syndrome   . Uncontrolled hypertension       Plan:         End-stage renal disease on hemodialysis  -Continue dialysis 3 times a week on MWF.  Had dialysis overnight.  Next dialysis on Wednesday  -please refer to dialysis orders      SVC thrombus  -possibly due to PermCath  -she benefits from AV graft.  -continue heparin  -evaluation by vascular surgery.    Anemia  -Monitor  -Epogen if HB less than 11    Uncontrolled hypertension  -dialysis and resume BP meds    CKD MBD  -Phosphorus, Ca, PTH monitor  -binders for Phos > 5.5  -aim for Phos level 3.5-5.5  -Renal diet    Acid-base  -Stable  -Continue dialysis    Electrolytes  -Monitor  -See dialysis orders  -Replace as needed    Volume status  -Fluid restriction  -Dialysis    Diet  -Renal diet              Beather Arbour, MD, FASN, 08/12/2021, 17:49

## 2021-08-12 NOTE — Consults (Signed)
[facility]   Nephrology Consult Note      ASSESSMENT: 46 year old female with PMH of ESRD (history of non-compliance), chronic CHF, chronic atypical chest pain, GERD, tobacco use,asthma, COPD and uncontrolled HTN. Presents as a transfer from [place] where she presented w/ shortness of breath, wheezing  and diffuse volume overload after missing hemodialysis due to transportation issues. 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 Plan for thrombectomy w/ stent placement and replacement of TDC on 6/20. Patient had dialysis at outside facility prior to transfer. Nephrology consulted for dialysis management.     ESRD on HD   MWF  Access: TDC    Anemia:   -Hgb 8.1  CKD-MBD:  -Ca 9.6  -Mg 2.1  -Phos 8.6  Electrolytes:   -Na136 , K  4.9  Acid/Base:   -Bicarb 28    Recommendations:  -No acute indication for RRT today. Patient had iHD prior to transfer. Improving pulmonary edema, lytes stable. Will evaluate tomorrow for RRT needs.   -BMP, mag, phos on dialysis days    -Renal diet.  - Maintain MAP > 57mmHg to optimize hemodynamic status  - Avoid nephrotoxins, adjust meds to appropriate GFR  - Optimize nutrition   - Strict I/O    PHYSICAL EXAM  Temperature: 36.7 C (98.1 F)  Heart Rate: 72  BP (Non-Invasive): 135/88  Respiratory Rate: 13  SpO2: 98 %  Consitutional: Pt NAD. Pleasant.      I/O:    IV Fluids:   Current Facility-Administered Medications   Medication Dose Frequency Last Rate   . heparin 25,000 units in 0.45% NS 250 mL infusion  18 Units/kg/hr (Adjusted) Continuous 18 Units/kg/hr (08/12/21 1752)       Labs:  I have reviewed all the following labs.  Recent Labs     08/11/21  1323 08/11/21  2216 08/12/21  0508 08/12/21  1723   SODIUM 132* 131* 133* 136  128*   POTASSIUM 6.5* 6.8* 4.0 4.9   CHLORIDE 93* 93* 92* 93*  95*   CO2 24 21 25 28    BUN 70* 73* 39* 43*   CREATININE 14.06* 14.10* 9.17* 10.92*   GFR 3* 3* 5* 4*   ANIONGAP 15 17 16  15*     Recent Labs      08/11/21  1323 08/11/21  2216 08/12/21  0508 08/12/21  1723   CALCIUM 9.6 10.6* 10.4* 9.6   ALBUMIN 3.4  --   --  3.6   MAGNESIUM 2.1  --   --  2.1   PHOSPHORUS 10.4*  --   --  8.6*     Recent Labs     08/11/21  2220 08/12/21  0508 08/12/21  1723   WBC 9.4 8.4 8.8   HGB 9.6* 9.1* 8.1*   HCT 27.9* 27.2* 25.1*   PLTCNT 297 273 256     Recent Labs     08/12/21  0153 08/12/21  1723   FI02 32 32.0   PH 7.40 7.33   PCO2 47* 61*   PO2 82 40   BICARBONATE 27.2* 27.2*       Radiology Results:   I have reviewed the following imaging studies and their final reads.  Results for orders placed or performed during the hospital encounter of 08/12/21 (from the past 72 hour(s))   XR AP MOBILE CHEST     Status: None    Narrative    Carolann Hunsucker    Female, 46  years old.    XR AP MOBILE CHEST performed on 08/12/2021 5:43 PM.    REASON FOR EXAM:  pulm edema    TECHNIQUE: 1 views/1 images submitted for interpretation.    COMPARISON:  Chest radiograph 08/11/2021.    FINDINGS:  The lungs are adequately inflated. The cardiomediastinal silhouette is at the upper limits of normal for size but stable from prior. There is mild interstitial prominence noted throughout the perihilar and infrahilar regions bilaterally overall improved when compared to one day prior. Findings likely represent a improving pulmonary edema pattern. No evidence of focal consolidation, effusion or pneumothorax. Ossifications are seen within the aortic arch. A vascular stent projects over the left axillary region. Unchanged positioning of a right IJ approach central venous catheter with tip projecting over the mid SVC.      Impression    1.Persistent but mildly improved pulmonary edema pattern without underlying effusion or new cardiopulmonary abnormality.  2.Right IJ approach central venous catheter tip projects over the mid SVC.       Thank you for the consult.  Will continue to follow.   Please call with any questions.      I independently of the faculty provider spent  a total of 15  minutes in direct/indirect care of this patient including initial evaluation, review of laboratory, radiology, diagnostic studies, review of medical record, order entry and coordination of care    [facility], APRN, FNP-C 08/12/2021, 19:05  Section of Nephrology

## 2021-08-12 NOTE — Nurses Notes (Addendum)
Report called to [facility] MICU. Transport called and awaiting on transport.    Patient is assigned to bed MICU 21.  Phone number for report: [phone]

## 2021-08-13 ENCOUNTER — Encounter (HOSPITAL_COMMUNITY)
Admission: AD | Disposition: A | Discharge status: Home / Self Care | Payer: Self-pay | Source: Other Acute Inpatient Hospital | Attending: Internal Medicine

## 2021-08-13 ENCOUNTER — Inpatient Hospital Stay (HOSPITAL_COMMUNITY): Payer: Commercial Managed Care - PPO | Admitting: Certified Registered"

## 2021-08-13 ENCOUNTER — Inpatient Hospital Stay (HOSPITAL_COMMUNITY): Payer: Commercial Managed Care - PPO | Admitting: Radiology

## 2021-08-13 ENCOUNTER — Encounter (HOSPITAL_COMMUNITY): Payer: Self-pay

## 2021-08-13 ENCOUNTER — Inpatient Hospital Stay (HOSPITAL_COMMUNITY): Payer: Commercial Managed Care - PPO

## 2021-08-13 DIAGNOSIS — I509 Heart failure, unspecified: Secondary | ICD-10-CM

## 2021-08-13 DIAGNOSIS — R918 Other nonspecific abnormal finding of lung field: Secondary | ICD-10-CM

## 2021-08-13 DIAGNOSIS — I132 Hypertensive heart and chronic kidney disease with heart failure and with stage 5 chronic kidney disease, or end stage renal disease: Secondary | ICD-10-CM

## 2021-08-13 DIAGNOSIS — Z79899 Other long term (current) drug therapy: Secondary | ICD-10-CM

## 2021-08-13 DIAGNOSIS — Z95828 Presence of other vascular implants and grafts: Secondary | ICD-10-CM

## 2021-08-13 DIAGNOSIS — I499 Cardiac arrhythmia, unspecified: Secondary | ICD-10-CM

## 2021-08-13 DIAGNOSIS — I871 Compression of vein: Secondary | ICD-10-CM

## 2021-08-13 DIAGNOSIS — K219 Gastro-esophageal reflux disease without esophagitis: Secondary | ICD-10-CM

## 2021-08-13 DIAGNOSIS — J449 Chronic obstructive pulmonary disease, unspecified: Secondary | ICD-10-CM

## 2021-08-13 LAB — CROSSMATCH RED CELLS - UNITS
UNIT DIVISION: 0
UNIT DIVISION: 0

## 2021-08-13 LAB — ECG 12-LEAD
Atrial Rate: 63 {beats}/min
Calculated P Axis: 75 degrees
Calculated R Axis: 22 degrees
Calculated T Axis: 103 degrees
PR Interval: 204 ms
QRS Duration: 102 ms
QT Interval: 470 ms
QTC Calculation: 480 ms
Ventricular rate: 63 {beats}/min

## 2021-08-13 LAB — CBC WITH DIFF
BASOPHIL #: <0.1 10*3/uL (ref <=0.20)
BASOPHIL %: 0 %
EOSINOPHIL #: <0.1 10*3/uL (ref <=0.50)
EOSINOPHIL %: 1 %
HCT: 23.9 % — ABNORMAL LOW (ref 34.8–46.0)
HGB: 7.8 g/dL — ABNORMAL LOW (ref 11.5–16.0)
IMMATURE GRANULOCYTE #: <0.1 10*3/uL (ref <0.10)
IMMATURE GRANULOCYTE %: 1 % (ref 0–1)
LYMPHOCYTE #: 1.5 10*3/uL (ref 1.00–4.80)
LYMPHOCYTE %: 16 %
MCH: 29.7 pg (ref 26.0–32.0)
MCHC: 32.6 g/dL (ref 31.0–35.5)
MCV: 90.9 fL (ref 78.0–100.0)
MONOCYTE #: 0.9 10*3/uL (ref 0.20–1.10)
MONOCYTE %: 10 %
MPV: 9.2 fL (ref 8.7–12.5)
NEUTROPHIL #: 6.66 10*3/uL (ref 1.50–7.70)
NEUTROPHIL %: 72 %
PLATELETS: 258 10*3/uL (ref 150–400)
RBC: 2.63 10*6/uL — ABNORMAL LOW (ref 3.85–5.22)
RDW-CV: 17.8 % — ABNORMAL HIGH (ref 11.5–15.5)
WBC: 9.2 10*3/uL (ref 3.7–11.0)

## 2021-08-13 LAB — BLOOD GAS W/ LACTATE REFLEX
%FIO2 (VENOUS): 28 %
BASE EXCESS: 3.2 mmol/L — ABNORMAL HIGH (ref <=3.0)
BICARBONATE (VENOUS): 27.1 mmol/L — ABNORMAL HIGH (ref 22.0–26.0)
LACTATE: 0.6 mmol/L (ref 0.0–1.3)
O2 SATURATION (VENOUS): 85.6 %
PCO2 (VENOUS): 53 mm/Hg — ABNORMAL HIGH (ref 41–51)
PH (VENOUS): 7.36 (ref 7.31–7.41)
PO2 (VENOUS): 53 mm/Hg — ABNORMAL HIGH (ref 35–50)

## 2021-08-13 LAB — TYPE AND SCREEN
ABO/RH(D): O POS
ANTIBODY SCREEN: NEGATIVE
UNITS ORDERED: 2

## 2021-08-13 LAB — HEPATITIS C ANTIBODY SCREEN WITH REFLEX TO HCV PCR: HCV ANTIBODY QUALITATIVE: NEGATIVE

## 2021-08-13 LAB — BASIC METABOLIC PANEL
ANION GAP: 15 mmol/L — ABNORMAL HIGH (ref 4–13)
BUN/CREA RATIO: 4 — ABNORMAL LOW (ref 6–22)
BUN: 45 mg/dL — ABNORMAL HIGH (ref 8–25)
CALCIUM: 9.4 mg/dL (ref 8.5–10.0)
CHLORIDE: 93 mmol/L — ABNORMAL LOW (ref 96–111)
CO2 TOTAL: 27 mmol/L (ref 22–30)
CREATININE: 11.47 mg/dL — ABNORMAL HIGH (ref 0.60–1.05)
ESTIMATED GFR: 4 mL/min/BSA — ABNORMAL LOW (ref >=60)
GLUCOSE: 87 mg/dL (ref 65–125)
POTASSIUM: 4.8 mmol/L (ref 3.5–5.1)
SODIUM: 135 mmol/L — ABNORMAL LOW (ref 136–145)

## 2021-08-13 LAB — PTT (PARTIAL THROMBOPLASTIN TIME)
APTT: 122.5 seconds (ref 24.2–37.5)
APTT: 118.3 seconds — ABNORMAL HIGH (ref 24.2–37.5)
APTT: 100.8 seconds — ABNORMAL HIGH (ref 24.2–37.5)
APTT: 94.8 seconds — ABNORMAL HIGH (ref 24.2–37.5)

## 2021-08-13 LAB — PT/INR
INR: 1.04 (ref 0.80–1.20)
PROTHROMBIN TIME: 12.1 seconds (ref 9.1–13.9)

## 2021-08-13 LAB — PROCALCITONIN REFLEX 24HR: PROCALCITONIN: 0.27 ng/mL (ref <0.50)

## 2021-08-13 LAB — MAGNESIUM: MAGNESIUM: 2.1 mg/dL (ref 1.8–2.6)

## 2021-08-13 LAB — HEPATITIS B SURFACE ANTIGEN: HBV SURFACE ANTIGEN QUALITATIVE: NEGATIVE

## 2021-08-13 LAB — PHOSPHORUS: PHOSPHORUS: 9.1 mg/dL — ABNORMAL HIGH (ref 2.4–4.7)

## 2021-08-13 LAB — MRSA SCREEN, PCR, RAPID: MRSA COLONIZATION SCREEN: NEGATIVE

## 2021-08-13 LAB — HGA1C (HEMOGLOBIN A1C WITH EST AVG GLUCOSE)
ESTIMATED AVERAGE GLUCOSE: 91 mg/dL
HEMOGLOBIN A1C: 4.8 % (ref 4.0–5.6)

## 2021-08-13 SURGERY — IR THROMBECTOMY PROCEDURE
Anesthesia: General | Laterality: Right

## 2021-08-13 MED ORDER — LABETALOL 200 MG TABLET
200.0000 mg | ORAL_TABLET | Freq: Three times a day (TID) | ORAL | Status: DC
Start: 2021-08-13 — End: 2021-08-14
  Administered 2021-08-13 – 2021-08-14 (×2): 200 mg via ORAL
  Filled 2021-08-13 (×6): qty 1

## 2021-08-13 MED ORDER — FENTANYL (PF) 50 MCG/ML INJECTION SOLUTION
INTRAMUSCULAR | Status: AC
Start: 2021-08-13 — End: 2021-08-13
  Filled 2021-08-13: qty 2

## 2021-08-13 MED ORDER — LIDOCAINE-PRILOCAINE 2.5 %-2.5 % TOPICAL CREAM
TOPICAL_CREAM | Freq: Once | CUTANEOUS | Status: DC
Start: 2021-08-13 — End: 2021-08-15
  Administered 2021-08-13: 0 mL via TOPICAL
  Filled 2021-08-13: qty 5

## 2021-08-13 MED ORDER — ROCURONIUM 10 MG/ML INTRAVENOUS SYRINGE WRAPPER
INJECTION | Freq: Once | INTRAVENOUS | Status: DC | PRN
Start: 2021-08-13 — End: 2021-08-13
  Administered 2021-08-13: 30 mg via INTRAVENOUS

## 2021-08-13 MED ORDER — SODIUM CHLORIDE 0.9 % INTRAVENOUS SOLUTION
INTRAVENOUS | Status: DC | PRN
Start: 2021-08-13 — End: 2021-08-13
  Administered 2021-08-13: 0 via INTRAVENOUS

## 2021-08-13 MED ORDER — SODIUM CHLORIDE 0.9 % INTRAVENOUS SOLUTION
20.0000 mg/kg | Freq: Once | INTRAVENOUS | Status: DC
Start: 2021-08-13 — End: 2021-08-13
  Filled 2021-08-13: qty 15

## 2021-08-13 MED ORDER — VANCOMYCIN IV - PHARMACIST TO DOSE PER PROTOCOL - NO EXCLUSION CRITERIA
Freq: Every day | Status: DC | PRN
Start: 2021-08-13 — End: 2021-08-19

## 2021-08-13 MED ORDER — DEXAMETHASONE SODIUM PHOSPHATE 4 MG/ML INJECTION SOLUTION
Freq: Once | INTRAMUSCULAR | Status: DC | PRN
Start: 2021-08-13 — End: 2021-08-13
  Administered 2021-08-13: 4 mg via INTRAVENOUS

## 2021-08-13 MED ORDER — OXYCODONE 5 MG TABLET
2.5000 mg | ORAL_TABLET | ORAL | Status: DC | PRN
Start: 2021-08-13 — End: 2021-08-19

## 2021-08-13 MED ORDER — SODIUM CITRATE 4 % (3 ML) INTRA-CATHETER INJECTION SYRINGE
INJECTION | Freq: Once | Status: DC | PRN
Start: 2021-08-13 — End: 2021-08-13
  Administered 2021-08-13 (×2): 2 mL

## 2021-08-13 MED ORDER — NEOSTIGMINE METHYLSULFATE 5 MG/5 ML (1 MG/ML) INTRAVENOUS SYRINGE
INJECTION | Freq: Once | INTRAVENOUS | Status: DC | PRN
Start: 2021-08-13 — End: 2021-08-13
  Administered 2021-08-13: 4 mg via INTRAVENOUS

## 2021-08-13 MED ORDER — CLONIDINE 0.2 MG/24 HR WEEKLY TRANSDERMAL PATCH
0.3000 mg | MEDICATED_PATCH | TRANSDERMAL | Status: DC
Start: 2021-08-13 — End: 2021-08-19
  Administered 2021-08-13: 0.3 mg via TRANSDERMAL
  Filled 2021-08-13: qty 1

## 2021-08-13 MED ORDER — SODIUM CHLORIDE 0.9 % INTRAVENOUS SOLUTION
5.0000 mg/h | INTRAVENOUS | Status: DC
Start: 2021-08-13 — End: 2021-08-14
  Administered 2021-08-13: 5 mg/h via INTRAVENOUS
  Administered 2021-08-13 (×2): 7.5 mg/h via INTRAVENOUS
  Administered 2021-08-13 (×3): 5 mg/h via INTRAVENOUS
  Administered 2021-08-13: 7.5 mg/h via INTRAVENOUS
  Administered 2021-08-13: 2.5 mg/h via INTRAVENOUS
  Administered 2021-08-14: 0 mg/h via INTRAVENOUS
  Filled 2021-08-13 (×2): qty 20

## 2021-08-13 MED ORDER — ISOSORBIDE MONONITRATE ER 60 MG TABLET,EXTENDED RELEASE 24 HR
60.0000 mg | ORAL_TABLET | Freq: Every morning | ORAL | Status: DC
Start: 2021-08-13 — End: 2021-08-17
  Administered 2021-08-13 – 2021-08-17 (×5): 60 mg via ORAL
  Filled 2021-08-13 (×7): qty 1

## 2021-08-13 MED ORDER — SODIUM CITRATE 4 % (3 ML) INTRA-CATHETER INJECTION SYRINGE
2.0000 | INJECTION | Status: AC
Start: 2021-08-13 — End: 2021-08-13
  Administered 2021-08-13: 2
  Filled 2021-08-13 (×2): qty 6

## 2021-08-13 MED ORDER — HYDRALAZINE 20 MG/ML INJECTION SOLUTION
10.0000 mg | INTRAMUSCULAR | Status: AC
Start: 2021-08-13 — End: 2021-08-13
  Administered 2021-08-13: 10 mg via INTRAVENOUS
  Filled 2021-08-13: qty 1

## 2021-08-13 MED ORDER — CYCLOBENZAPRINE 5 MG TABLET
5.0000 mg | ORAL_TABLET | Freq: Two times a day (BID) | ORAL | Status: DC | PRN
Start: 2021-08-13 — End: 2021-08-19
  Administered 2021-08-13 – 2021-08-15 (×4): 5 mg via ORAL
  Filled 2021-08-13 (×6): qty 1

## 2021-08-13 MED ORDER — HYDRALAZINE 20 MG/ML INJECTION SOLUTION
10.0000 mg | INTRAMUSCULAR | Status: DC | PRN
Start: 2021-08-13 — End: 2021-08-17
  Administered 2021-08-13 – 2021-08-17 (×7): 10 mg via INTRAVENOUS
  Filled 2021-08-13 (×7): qty 1

## 2021-08-13 MED ORDER — FENTANYL (PF) 50 MCG/ML INJECTION SOLUTION
Freq: Once | INTRAMUSCULAR | Status: DC | PRN
Start: 2021-08-13 — End: 2021-08-13
  Administered 2021-08-13: 50 ug via INTRAVENOUS

## 2021-08-13 MED ORDER — DILTIAZEM CD 120 MG CAPSULE,EXTENDED RELEASE 24 HR
120.0000 mg | ORAL_CAPSULE | Freq: Once | ORAL | Status: AC
Start: 2021-08-13 — End: 2021-08-13
  Administered 2021-08-13: 120 mg via ORAL
  Filled 2021-08-13: qty 1

## 2021-08-13 MED ORDER — VANCOMYCIN IV - INTERMITTENT DOSING
Freq: Every day | Status: DC | PRN
Start: 2021-08-13 — End: 2021-08-19

## 2021-08-13 MED ORDER — LIDOCAINE HCL 10 MG/ML (1 %) INJECTION SOLUTION
INTRAMUSCULAR | Status: AC
Start: 2021-08-13 — End: 2021-08-13
  Filled 2021-08-13: qty 20

## 2021-08-13 MED ORDER — GLYCOPYRROLATE 0.2 MG/ML INJECTION SOLUTION
Freq: Once | INTRAMUSCULAR | Status: DC | PRN
Start: 2021-08-13 — End: 2021-08-13
  Administered 2021-08-13: .6 mg via INTRAVENOUS

## 2021-08-13 MED ORDER — LIDOCAINE (PF) 100 MG/5 ML (2 %) INTRAVENOUS SYRINGE
INJECTION | Freq: Once | INTRAVENOUS | Status: DC | PRN
Start: 2021-08-13 — End: 2021-08-13
  Administered 2021-08-13: 80 mg via INTRAVENOUS

## 2021-08-13 MED ORDER — LIDOCAINE 3.6 %-MENTHOL 1.25 % TOPICAL PATCH
1.0000 | MEDICATED_PATCH | Freq: Every day | CUTANEOUS | Status: DC
Start: 2021-08-13 — End: 2021-08-19
  Administered 2021-08-13 – 2021-08-16 (×4): 1 via TRANSDERMAL
  Administered 2021-08-17 – 2021-08-19 (×3): 0 via TRANSDERMAL
  Filled 2021-08-13 (×7): qty 1

## 2021-08-13 MED ORDER — CINACALCET 30 MG TABLET
30.0000 mg | ORAL_TABLET | Freq: Every evening | ORAL | Status: DC
Start: 2021-08-13 — End: 2021-08-19
  Administered 2021-08-13 – 2021-08-18 (×6): 30 mg via ORAL
  Filled 2021-08-13 (×8): qty 1

## 2021-08-13 MED ORDER — PREMIXED HEMODIALYSATE
INJECTION | INTRAVENOUS | Status: DC
Start: 2021-08-13 — End: 2021-08-13

## 2021-08-13 MED ORDER — IPRATROPIUM 0.5 MG-ALBUTEROL 3 MG (2.5 MG BASE)/3 ML NEBULIZATION SOLN
3.0000 mL | INHALATION_SOLUTION | Freq: Four times a day (QID) | RESPIRATORY_TRACT | Status: DC
Start: 2021-08-13 — End: 2021-08-13

## 2021-08-13 MED ORDER — PROPOFOL 10 MG/ML IV BOLUS
INJECTION | Freq: Once | INTRAVENOUS | Status: DC | PRN
Start: 2021-08-13 — End: 2021-08-13
  Administered 2021-08-13: 170 mg via INTRAVENOUS

## 2021-08-13 MED ORDER — LABETALOL 5 MG/ML INTRAVENOUS SOLUTION
Freq: Once | INTRAVENOUS | Status: DC | PRN
Start: 2021-08-13 — End: 2021-08-13
  Administered 2021-08-13 (×3): 5 mg via INTRAVENOUS

## 2021-08-13 MED ORDER — SODIUM CITRATE 4 % (3 ML) INTRA-CATHETER INJECTION SYRINGE
2.0000 | INJECTION | Status: DC
Start: 2021-08-13 — End: 2021-08-13
  Filled 2021-08-13: qty 6

## 2021-08-13 MED ORDER — HEPARIN (PORCINE) IN NS (PF) 2 UNIT/ML IV
INTRAVENOUS | Status: AC
Start: 2021-08-13 — End: 2021-08-13
  Filled 2021-08-13: qty 3000

## 2021-08-13 MED ORDER — DILTIAZEM CD 240 MG CAPSULE,EXTENDED RELEASE 24 HR
240.0000 mg | ORAL_CAPSULE | Freq: Every day | ORAL | Status: DC
Start: 2021-08-14 — End: 2021-08-14
  Filled 2021-08-13: qty 1

## 2021-08-13 MED ORDER — MONTELUKAST 10 MG TABLET
10.0000 mg | ORAL_TABLET | Freq: Every evening | ORAL | Status: DC
Start: 2021-08-13 — End: 2021-08-19
  Administered 2021-08-13 – 2021-08-18 (×6): 10 mg via ORAL
  Filled 2021-08-13 (×7): qty 1

## 2021-08-13 MED ORDER — LABETALOL 5 MG/ML INTRAVENOUS SOLUTION
10.0000 mg | INTRAVENOUS | Status: DC | PRN
Start: 2021-08-13 — End: 2021-08-14
  Administered 2021-08-13: 10 mg via INTRAVENOUS
  Filled 2021-08-13: qty 20

## 2021-08-13 MED ORDER — DOXAZOSIN 4 MG TABLET
4.0000 mg | ORAL_TABLET | Freq: Every evening | ORAL | Status: DC
Start: 2021-08-13 — End: 2021-08-19
  Administered 2021-08-13 – 2021-08-18 (×6): 4 mg via ORAL
  Filled 2021-08-13 (×8): qty 1

## 2021-08-13 MED ORDER — LABETALOL 200 MG TABLET
200.0000 mg | ORAL_TABLET | Freq: Two times a day (BID) | ORAL | Status: DC
Start: 2021-08-13 — End: 2021-08-13
  Administered 2021-08-13: 0 mg via ORAL
  Filled 2021-08-13: qty 1

## 2021-08-13 MED ORDER — PREMIXED HEMODIALYSATE
INJECTION | INTRAVENOUS | Status: AC
Start: 2021-08-13 — End: 2021-08-13

## 2021-08-13 MED ORDER — SODIUM CITRATE 4 % (3 ML) INTRA-CATHETER INJECTION SYRINGE
INJECTION | Status: AC
Start: 2021-08-13 — End: 2021-08-13
  Filled 2021-08-13: qty 6

## 2021-08-13 MED ORDER — IOPAMIDOL 300 MG IODINE/ML (61 %) INTRAVENOUS SOLUTION
Freq: Once | INTRAVENOUS | Status: DC | PRN
Start: 2021-08-13 — End: 2021-08-13
  Administered 2021-08-13: 200 mL via INTRAVENOUS

## 2021-08-13 MED ORDER — OXYCODONE 5 MG TABLET
5.0000 mg | ORAL_TABLET | ORAL | Status: DC | PRN
Start: 2021-08-13 — End: 2021-08-19
  Administered 2021-08-13 – 2021-08-18 (×11): 5 mg via ORAL
  Filled 2021-08-13 (×11): qty 1

## 2021-08-13 MED ORDER — CLOPIDOGREL 75 MG TABLET
75.0000 mg | ORAL_TABLET | Freq: Every day | ORAL | Status: DC
Start: 2021-08-14 — End: 2021-08-19
  Administered 2021-08-14 – 2021-08-19 (×6): 75 mg via ORAL
  Filled 2021-08-13 (×6): qty 1

## 2021-08-13 MED ORDER — LABETALOL 200 MG TABLET
200.0000 mg | ORAL_TABLET | Freq: Two times a day (BID) | ORAL | Status: DC
Start: 2021-08-13 — End: 2021-08-13
  Administered 2021-08-13: 200 mg via ORAL
  Filled 2021-08-13: qty 1

## 2021-08-13 MED ORDER — PROMETHAZINE 25 MG TABLET
12.5000 mg | ORAL_TABLET | Freq: Once | ORAL | Status: AC
Start: 2021-08-14 — End: 2021-08-13
  Administered 2021-08-13: 12.5 mg via ORAL
  Filled 2021-08-13: qty 1

## 2021-08-13 MED ORDER — VANCOMYCIN 10 GRAM INTRAVENOUS SOLUTION
20.0000 mg/kg | Freq: Once | INTRAVENOUS | Status: AC
Start: 2021-08-13 — End: 2021-08-13
  Administered 2021-08-13: 1500 mg via INTRAVENOUS
  Administered 2021-08-13: 0 mg via INTRAVENOUS
  Filled 2021-08-13: qty 15

## 2021-08-13 MED ORDER — GABAPENTIN 100 MG CAPSULE
100.0000 mg | ORAL_CAPSULE | Freq: Once | ORAL | Status: AC
Start: 2021-08-13 — End: 2021-08-13
  Administered 2021-08-13: 100 mg via ORAL
  Filled 2021-08-13: qty 1

## 2021-08-13 MED ORDER — ONDANSETRON HCL (PF) 4 MG/2 ML INJECTION SOLUTION
4.0000 mg | Freq: Three times a day (TID) | INTRAMUSCULAR | Status: DC | PRN
Start: 2021-08-13 — End: 2021-08-19
  Administered 2021-08-13 – 2021-08-19 (×8): 4 mg via INTRAVENOUS
  Filled 2021-08-13 (×8): qty 2

## 2021-08-13 SURGICAL SUPPLY — 37 items
BAG WASTE 48IN 72IN SPIKE FEMALE LL RFLX VALVE LRG BORE MRT DSPSL MSR DISP 1400ML (IV TUBING & ACCESSORIES) ×2 IMPLANT
BAG WASTE SALINE/CONTRAST_K1000576 10/BX (IV TUBING & ACCESSORIES) ×3
CATH ANGIO 4FR ANG 120CM GLIDECATH RADIFOCUS 2 BRD PERI SS HDRPH STRL LF  ACPT .038IN GW (CATHETERS) ×2 IMPLANT
CATH ANGIO 4FR ANG 120CM GLIDE_CATH RADIFOCUS 2 BRD PERI SS (CATHETERS) ×3
CATH ANGIO 4FR ANG 65CM GLIDECATH RADIFOCUS 2 BRD PERI SS HDRPH STRL LF  ACPT .038IN GW (CATHETERS) ×2 IMPLANT
CATH ANGIO 4FR ANG CURVE 65CM_GLIDECATH GLD 2 BRD PERI SS (CATHETERS) ×3
CATH ARMADA 14MM 40MM 80CM OTW LOW PROF TIP BAL DIL ACPT .035IN GW PTA (BALLOON) ×2 IMPLANT
CATH ARMADA 14MM 40MM 80CM OTW_LO PRFL TIP BAL DIL ACPT (BALLOON) ×3
CATH ATLS GLD 18MM 6CM 80CM LRG DIA SHORT SHLDR BAL DIL ACPT (BALLOON) ×3
CATH ATLS GLD 18MM 6CM 80CM LRG DIA SHORT SHLDR BAL DIL ACPT .035IN GW 9FR SHEATH PTA (BALLOON) ×2 IMPLANT
CATH MUSTANG 12MM 40MM 75CM HI PRESS BAL DIL NYBAX ACPT .035IN GW 7FR SHEATH (BALLOON) ×2 IMPLANT
CATH MUSTANG 12MM 40MM 75CM HI_PRESS BAL DIL NYBAX ACPT .035 (BALLOON) ×3
CATH US OPTICROSS 8FR PERI STRL LF  ACPT .035IN GW (CATHETERS) ×2 IMPLANT
CATH US OPTICROSS 8FR PERI STR_L LF ACPT .035IN GW (CATHETERS) ×3
CONV USE 338045 - PACK SURG CSTM RAD (MED SURG SUPPLIES) ×2 IMPLANT
DEVICE INFLAT 12IN STD INFL Y ADPR BRD TUBE SYRG PLYCRB 20CU CM 20ML LF (CUSTOM TRAYS & PACK) ×2 IMPLANT
DEVICE TORQUE GLIDECATH ACPT .01-.038 IN GW (MISCELLANEOUS PT CARE ITEMS) ×2 IMPLANT
DEVICE TRQ GLIDECATH ACPT .01-_.038 IN GW (MISCELLANEOUS PT CARE ITEMS) ×3
DRUG DEL EKOSONIC MACH 4 MICROSONIC 5.4FR 135CM 12CM TREATMENT ZN INTLGNT CATH EVAS DEV SYSTEM ACPT (MED SURG SUPPLIES) ×2 IMPLANT
DRUG DEL EKOSONIC MACH 4 MICRO_SONIC 5.4FR 135CM 12CM TX ZN (MED SURG SUPPLIES) ×3
GW AMPLATZSS .035IN 7CM 260CM_LXB FLPY TIP FLTWR COIL SS (WIRE) ×3
GW GLDWR .035IN 3CM 260CM FLXB ANG TIP RADOPQ STF NITINOL TUNG POLYUR HDRPH VAS (WIRE) ×2 IMPLANT
GW GLDWR .035IN 3CM 260CM FLXB_ANG TIP RADOPQ STF NITINOL (WIRE) ×3
GW URO .035IN 260CM 7CM AMPLATZSS STR (WIRE) ×2 IMPLANT
KIT ANGIO INFLATION DEVICE (CUSTOM TRAYS & PACK) ×3
KIT CATH PALINDROME 14.5FR 40CM 23CM PRECURVE CARBOTHANE 2 LUM SMTR TISS DIL SEAL CAP 1.9ML STRL LF (DIALYSIS SUPPLIES) ×2 IMPLANT
KIT CATH PALINDROME 14.5FR 40C_M 23CM PRECURVE CARBOTHANE 2 (DIALYSIS SUPPLIES) ×3
KIT INTROD 10CM 5FR 21GA MERIT S-MAK .018IN STF DIL SS GW (VASCULAR) ×3
KIT INTROD 10CM 5FR 21GA MERIT S-MAK .018IN STF DIL SS GW TIP ECHO ENH NEEDLE MINI ACCESS 7CM (VASCULAR) ×2 IMPLANT
PACK RADIOLOGY ANGIOGRAPHY (MED SURG SUPPLIES) ×3
SET ANGIO 2IN FLUID MGMT 3W STOPCOCK VNT LRG BORE FX FEMALE (IV TUBING & ACCESSORIES) ×2 IMPLANT
SET ANGIO 2IN FLUID MGMT 3W ST_OPCOCK VNT LRG BORE FX FEMALE (IV TUBING & ACCESSORIES) ×3
SHEATH 12X45 CHECK FLO_G56279 (INTRODUCER) ×3
SHEATH GD 45CM 12FR 4.1MM FLEXOR CKFL ANL0 CURVE HDRPH RADOPQ HI FLX DIL ACPT .035IN GW STRL DISP (INTRODUCER) ×2 IMPLANT
STENT WALSTNT 18MM 60MM ENDOPR CATH VENOUS 75CM ACPT 11FR SHEATH (STENTS VASCULAR) ×2 IMPLANT
THROMBECT ANGJT ZELANTEDVT POWER PULSE 8FR 6MM 105CM OTW CATH CNRST INJ PORT HI MBL CNSL SYSTEM PERI (THROMBECTOMY) ×2 IMPLANT
THROMBECT ANGJT ZELANTEDVT PWR_PLS 8FR 6MM 105CM OTW CATH (THROMBECTOMY) ×3

## 2021-08-13 NOTE — Brief Op Note (Signed)
[facility]   Brief Post-Interventional Radiology Procedure Note    Patient Name: [name] Number: [id number]  Date of Birth: 1975/07/26  Date of Service: 08/13/2021    PROCEDURE: SVC thrombectomy, venoplasty, venous stent placement, tunneled hemodialysis catheter replacement  Attending: Limmie Patricia, MD  Assistant: None  Preop Diagnosis: SVC syndrome  Postop Diagnosis: Same  Anesthesia: General anesthesia  Estimated Blood Loss: 150 mL  Specimens Removed: none    FINDINGS/INTERVENTIONS/COMPLICATIONS:   1. Initial venography and intravenous ultrasound (IVUS) revealed moderate stenosis of the SVC related to large pericatheter thrombus.  2. Mechanical/rheolytic thrombectomy and venoplasty of the SVC. Post-intervention venography and IVUS demonstrated persistent SVC thrombus.  3. Stenting of the SVC with self-expandable uncovered stent (18 x 60 mm Wallstent)  4. Final venography and IVUS demonstrates good flow through the SVC and normal luminal diameter with patent stent.  5. Exchange of tunneled hemodialysis catheter with placement of a new 14.5 F x 23 cm Palindrome tunneled HD catheter.    No immediate complications  Full dictation to follow.    RECOMMENDATIONS & FOLLOW-UP:   1. Continue therapeutic anticoagulation with heparin drip, which can be transitioned to outpatient regimen when appropriate. Patient should be on anticoagulation + Plavix for 3-6 months.  2. Hemoglobinuria is expected post-rheolytic thrombectomy. This is not hematuria. Urine should clear within a few days post-procedure.  3. Hemodialysis catheter is ok for immediate use.    Limmie Patricia, MD  Attending Physician, Interventional Radiology    08/13/2021, 13:21

## 2021-08-13 NOTE — Anesthesia Procedure Notes (Signed)
[facility]    Arterial Line Procedure    Pt location: In OR  Consent:     Risks discussed:  Bleeding, infection and pain  Universal protocol:     Procedure explained and questions answered to patient or proxy's satisfaction: yes      Immediately prior to procedure a time out was called: yes      Patient identity confirmed:  Verbally with patient, hospital-assigned identification number and arm band  Pre-procedure details:     Preparation: Preprocedure hand washing was performed; sterile field was maintained     Ultrasound used for needle placement, imaging, supervision, and interpretation      Anesthesia (see MAR for exact dosages):     A 20 G Catheter type: Arrow 1 and 3/4 inch in length,  Placed on the right  radial artery  using  With  number of attempts:3.Secured with: tape   MEDICATIONS:     Post-procedure details:    Patient tolerance of procedure:  Tolerated well, no immediate complications blood withdrawn easily, flushes easily and good waveform  Complications:  Performed By:  Performing provider: Eveline Keto, CRNA Authorizing provider: Barbaraann Boys, MD    Comments:      Placed by interventional radiologist

## 2021-08-13 NOTE — Pharmacy (Signed)
Pharmacy Medication Reconciliation    DISCLAIMER: This medication list was collected without a patient interview as noted below. Please be aware that updates to the medication list may be necessary per the discretion of the primary team if the patient is able to be interviewed at a later time.     Patient Name: [name], [name]  Date of Service: 08/13/2021  Date of Admission: 08/12/2021  Date of Birth: 05-11-1975  Length of Stay:   1 day   Service: MICU1      Transitions of Care:  Discharge Pharmacy services were discussed with the patient and the Meds to [facility] flowsheet and preferred pharmacy information were updated in EMR if applicable and able to assess with patient.    Information was collected from:  [place] [phone]     Does the Patient have Prescription Coverage? Yes    Clarified Prior to Admission Medications:  Prior to Admission medications    Medication Sig Taking Resumed Y/N (RPh) Comments   albuterol sulfate (PROVENTIL OR VENTOLIN OR PROAIR) 90 mcg/actuation Inhalation oral inhaler Take 1-2 Puffs by inhalation Every 6 hours as needed     Filled 5/16   amoxicillin-pot clavulanate (AUGMENTIN) 500-125 mg Oral Tablet Take 1 Tablet by mouth Twice daily Take for 10 days; prescription was filled 08-02-21     Filled 6/9 10 days    aspirin (ECOTRIN) 81 mg Oral Tablet, Delayed Release (E.C.) Take 1 Tablet (81 mg total) by mouth Once a day   Y  OTC   budesonide-formoteroL (SYMBICORT) 160-4.5 mcg/actuation Inhalation oral inhaler Take 2 Puffs by inhalation Twice daily   Y  Filled 5/16 30 days    calcitrioL (ROCALTROL) 0.5 mcg Oral Capsule Take 2 Capsules (1 mcg total) by mouth Every Monday, Wednesday and Friday     Filled 4/16 90 days    cinacalcet (SENSIPAR) 30 mg Oral Tablet Take 1 Tablet (30 mg total) by mouth Every evening   Y  Filled 5/26 30 days    cloNIDine (CATAPRES-TTS) 0.3 mg/24 hr Transdermal Patch Weekly Place 1 Patch (0.3 mg total) on the skin Every 7 days     Filled 6/1 30 days     clopidogreL (PLAVIX) 75 mg Oral Tablet Take 1 Tablet (75 mg total) by mouth Once a day   Y  Filled 4/4 90 days    cyclobenzaprine (FLEXERIL) 5 mg Oral Tablet Take 1 Tablet (5 mg total) by mouth Twice per day as needed for Muscle spasms     Filled 5/25 30 days    dilTIAZem (CARDIZEM CD) 120 mg Oral Capsule, Sust. Release 24 hr Take 1 Capsule (120 mg total) by mouth Once a day   Y  Filled 4/4 90 days    doxazosin (CARDURA) 4 mg Oral Tablet Take 1 Tablet (4 mg total) by mouth Every evening     Filled 4/4 90 days    ferric citrate (AURYXIA ORAL) Take 1 g by mouth Three times daily before meals "Take 2 tablets three times daily before meals and 1 tablet before snacks"     Filled 4/26 30 days    furosemide (LASIX) 80 mg Oral Tablet Take 1 Tablet (80 mg total) by mouth Twice daily     Filled 5/5 90 days    guaiFENesin (MUCINEX) 600 mg Oral Tablet Extended Release 12hr Take 1 Tablet (600 mg total) by mouth Every 12 hours     OTC   isosorbide mononitrate (IMDUR) 60 mg Oral Tablet  Sustained Release 24 hr Take 1 Tablet (60 mg total) by mouth Every morning   Y  Filled 5/25 90 days    labetaloL (NORMODYNE) 200 mg Oral Tablet Take 1 Tablet (200 mg total) by mouth Twice daily   Y  Filled 6/1 90 days    loratadine (CLARITIN) 10 mg Oral Tablet Take 1 Tablet (10 mg total) by mouth Once a day     OTC   montelukast (SINGULAIR) 10 mg Oral Tablet Take 1 Tablet (10 mg total) by mouth Every evening   Y  Filled 5/5 90 days    nitroGLYCERIN (NITROSTAT) 0.4 mg Sublingual Tablet, Sublingual Place 1 Tablet (0.4 mg total) under the tongue Every 5 minutes for 3 doses for 3 doses over 15 minutes     Filled 5/13   ondansetron (ZOFRAN) 4 mg Oral Tablet Take 1 Tablet (4 mg total) by mouth Every 12 hours as needed for Nausea/Vomiting     Filled 5/25 15 days    pantoprazole (PROTONIX) 40 mg Oral Tablet, Delayed Release (E.C.) Take 1 Tablet (40 mg total) by mouth Once a day   Y  Filled 5/13 90 days      Did patient's home medication list require  updates? Yes    Medications UPDATED on Prior to Admission Med List:    Medications ADDED to Prior to Admission Med List:  -calcitriol    Patient has hx of taking carvedilol 25mg  BID (filled 5/13 30 days) and spironolactone 25mg  2BID (filled 5/25 30 days). Past discharge summaries indicate these may have been discontinued    Patient has been off floor for procedure.      Allergies:    Allergies   Allergen Reactions   . Lisinopril Swelling     Tongue and throat swelling       Karie Georges    Did pharmacist make suggestions for medication reconciliation? No    Medications REMOVED from home medication list:  None   Pharmacist Recommendations:  Discuss home medications with patient once she is able to participate in medication history interview; resume additional required home meds.  Avoid triple therapy with aspirin, clopidogrel and heparin infusion.     Alfonso Ramus, PHARMD, BCCCP

## 2021-08-13 NOTE — Consults (Signed)
Dialysis Services follow-up:  Date of service: 08/13/2021  Hospital Day:  LOS: 1 day     Assessment:  46 year old female with PMH of ESRD (history of non-compliance), chronic CHF, chronic atypical chest pain, GERD, tobacco use,asthma, COPD and uncontrolled HTN. Presents as a transfer from [place] where she presented w/ shortness of breath, wheezing  and diffuse volume overload after missing hemodialysis due to transportation issues. 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 Last HD was 6/19 at outside facility prior to transfer. Nephrology consulted for dialysis management.     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 as well    Lytes: All within acceptable parameters  A/B: Bicarb 27, stable  CKD MB:Ca, mag at goal; phos 9.1  Sensipar 30 mg daily  Anemia: Hgb 7.8  Mircera 150 mcg q2wks (unknown last dose)  S/p 2 units PRBC on 6/20  ID  BCx: 6/18 GPC  UCx: none  Antibiotics: azithromycin (6/18), ceftriaxone (6/18)  Cardiology: no TTE/TEE  Volume status  Hypervolemic on exam with pulmonary edema and HTN  UO: not quantified  06/19 0700 - 06/20 0659  In: 128.41 [I.V.:128.41]  Out: -       Labs  Recent Labs     08/11/21  2216 08/12/21  0508 08/12/21  1723 08/13/21  0024   SODIUM 131* 133* 136  128* 135*   POTASSIUM 6.8* 4.0 4.9 4.8   CHLORIDE 93* 92* 93*  95* 93*   CO2 21 25 28 27    BUN 73* 39* 43* 45*   CREATININE 14.10* 9.17* 10.92* 11.47*   GFR 3* 5* 4* 4*   ANIONGAP 17 16 15* 15*     Recent Labs     08/11/21  2216 08/12/21  0508 08/12/21  1723 08/13/21  0024   CO2 21 25 28 27      Recent Labs     08/11/21  1323 08/11/21  2216 08/12/21  0508 08/12/21  1723 08/13/21  0024   CALCIUM 9.6 10.6* 10.4* 9.6 9.4   ALBUMIN 3.4  --   --  3.6  --    MAGNESIUM 2.1  --   --  2.1 2.1   PHOSPHORUS  10.4*  --   --  8.6* 9.1*     No results found for: Tharon Aquas  PTH   Date Value Ref Range Status   08/06/2021 249.5 (H) 12.0 - 88.0 pg/mL Final     Recent Labs     08/11/21  1330 08/11/21  2220 08/13/21  0024   WBC 13.4*   < > 9.2   HGB 6.0*   < > 7.8*   HCT 18.6*   < > 23.9*   PLTCNT 369   < > 258   PMNS 83*   < > 72   LYMPHOCYTES 8*  --   --    EOSINOPHIL 2  --   --     < > = values in this interval not displayed.     Lab Results   Component Value Date/Time    IRONBINDCAP 263 08/05/2021 06:03 PM    IRONSAT 13 (L) 08/05/2021 06:03 PM    FERRITIN 940 (H) 08/05/2021 06:03 PM  No results for input(s): FOLATE, VITB12 in the last 72 hours.  No results found for any visits on 08/12/21 (from the past 24 hour(s)).    Recs:   HD today with goal UF 3-4kg as tolerated. Suspect that SVC syndrome is likely r/t HD non-compliance with TDC not getting flushed regularly    Subjective:  Pt went for thrombectomy and TDC replacement today. Attempted to see pt this a.m. but she was off floor for procedure. Seen while on dialysis after her procedure. Pt sitting up in bed, crying, c/o "pain in my back." RN notified.    Physical Exam:  BP (!) 182/127   Pulse 73   Temp 36.5 C (97.7 F)   Resp 13   Wt 88 kg (194 lb 0.1 oz)   SpO2 96%   BMI 30.39 kg/m       General: Tearful, anxious  HEENT:NC/AT   LUNGS: increased work of breathing, orthopnea  Cardiac: no rub, no peripheral edema  Dialysis access: R IJ TDC  Abdomen:Soft nontender, BS active  Musculoskeletal: No gross deformities  Neuro: Alert and Oriented, moves all 4 extremities  Skin: Skin is warm and dry      acetaminophen (TYLENOL) tablet, 650 mg, Oral, Q4H PRN  aspirin (ECOTRIN) enteric coated tablet 81 mg, 81 mg, Oral, Daily  budesonide-formoterol (SYMBICORT) 160 mcg-4.5 mcg per inhalation oral inhaler - "Respiratory to administer", 2 Puff, Inhalation, 2x/day  cinacalcet (SENSIPAR) tablet, 30 mg, Oral, QPM  [START ON 08/14/2021] clopidogrel (PLAVIX)  75 mg tablet, 75 mg, Oral, Daily  cyclobenzaprine (FLEXERIL) tablet, 5 mg, Oral, 2x/day PRN  D5W 250 mL flush bag, , Intravenous, Q15 Min PRN  dilTIAZem (CARDIZEM CD) 24 hr extended release capsule, 120 mg, Oral, Daily  heparin 25,000 units in 0.45% NS 250 mL infusion, 18 Units/kg/hr (Adjusted), Intravenous, Continuous  hydrALAZINE (APRESOLINE) injection 10 mg, 10 mg, Intravenous, Q1H PRN  isosorbide mononitrate (IMDUR) 24 hr extended release tablet, 60 mg, Oral, QAM  labetalol (NORMODYNE) tablet, 200 mg, Oral, 2x/day  montelukast (SINGULAIR) 10 mg tablet, 10 mg, Oral, QPM  nicotine (NICODERM CQ) transdermal patch (mg/24 hr), 14 mg, Transdermal, Daily  NS 250 mL flush bag, , Intravenous, Q15 Min PRN  NS flush syringe, 2-6 mL, Intracatheter, Q8HRS  NS flush syringe, 2-6 mL, Intracatheter, Q1 MIN PRN  pantoprazole (PROTONIX) delayed release tablet, 40 mg, Oral, Daily  perflutren lipid microspheres (DEFINITY) 1.3 mL in NS 10 mL (tot vol) injection, 2 mL, Intravenous, Give in Cardiology  polyethylene glycol (MIRALAX) oral packet, 17 g, Oral, Daily  premixed hemodialysate (NATURALYTE) 3.43 L with potassium chloride 2 mEq/L, calcium chloride 2.5 mEq/L, , Hemodialysis, Give in Dialysis  sennosides-docusate sodium (SENOKOT-S) 8.6-50mg  per tablet, 1 Tablet, Oral, 2x/day  sodium citrate 4% injection syringe, 2 Syringe, Intracatheter, Give in Dialysis  vancomycin (VANCOCIN) 1,500 mg in NS 500 mL IVPB, 20 mg/kg (Adjusted), Intravenous, Once  Vancomycin IV - Pharmacist to Dose per Protocol, , Does not apply, Daily PRN  Vancomycin IV Intermittent Dosing, , Does not apply, Daily PRN        I, independently of the faculty provider, spent a total of 25 minutes in direct/indirect care of this patient including initial evaluation, review of laboratory, radiology, diagnostic studies, review of medical record, order entry and coordination of care    Meredeth Ide, APRN  08/13/2021, 12:24  Nephrology consults team 1    Attending  attestation note:    I reviewed NP Neuman's note.  I agree with the findings and  plan of care as documented in the NP's note. Any additions/changes were made accordingly. Patient was not in his room. She will have dialysis today.      08/13/2021, 17:50    Jacqlyn Larsen, M.D., M.S., FASN  Assistant Professor of Medicine  Section of Nephrology

## 2021-08-13 NOTE — Nurses Notes (Addendum)
1000: Patient taken to IR with hep gtt running, ok to leave running per IR attending.     1330: Patient returned from IR with right radial artline on facemask. Patient c/o of lower right back pain. NAST completed and patient passed, placed back on diet.     1415: Patient hypertensive 170s/117, PRN hydralazine given.     1530 Artline removed. Tylenol did not work for patient, lidocaine patch applied and PRN oxy ordered. Patient hypertensive 180s-200's/140 --> PRN labetalol given and Nicardipine gtt ordered.     1630: Patient states her back pain is sharp and a little better but a previous heparin injection site to abdomen while at outside facility its really hurting, MICU notified there is a knot at the site, plan to [place]. She is c/o nausea as well, MICU ordered PRN Zofran.

## 2021-08-13 NOTE — Anesthesia Preprocedure Evaluation (Addendum)
ANESTHESIA PRE-OP EVALUATION  Diani Zeiner  Planned Procedure: IR THROMBECTOMY PROCEDURE  IR TDC CHANGE (Right)  Review of Systems    PONV       patient summary reviewed  nursing notes reviewed        Pulmonary   asthma and sleep apnea,   Cardiovascular    Hypertension, past MI, CAD, CHF and ECG reviewed ,No peripheral edema,        GI/Hepatic/Renal    GERD        Endo/Other   neg endo/other ROS,       Neuro/Psych/MS   negative neuro/psych ROS,      Cancer    negative hematology/oncology ROS,                   Physical Assessment      Airway         TM distance: >3 FB    Neck ROM: full  Mouth Opening: good.            Dental                    Pulmonary    Breath sounds clear to auscultation  (-) no rhonchi, no decreased breath sounds, no wheezes, no rales and no stridor     Cardiovascular    Rhythm: regular  Rate: Normal  (-) no friction rub, carotid bruit is not present, no peripheral edema and no murmur     Other findings            Plan  ASA 3     Planned anesthesia type: general     general anesthesia with endotracheal tube intubation            Additional Plans: Arterial line      Intravenous induction     Anesthesia issues/risks discussed are: Dental Injuries, PONV, Intraoperative Awareness/ Recall and Sore Throat.  Anesthetic plan and risks discussed with patient  Signed consent obtained            Patient's NPO status is appropriate for Anesthesia.           Plan discussed with CRNA.

## 2021-08-13 NOTE — Nurses Notes (Signed)
RN went to check on patient, she was very tearful from her pain and started talking with me. Patient stated "I just want to be dead then to keep going through all this. I keep telling everyone back home I am tired and they just brush me off and say I do not mean that. I just want someone to listen to me, I am in the hospital more than I am home, dialysis is not working. I have to keep going for surgeries and getting stuck for blood and I am always in pain. This is not a quality of life, I am just so so tired."     When she states back home, she is referring to treatment teams.     I asked if there was anyone in her family to talk too, she stated "Have you seen anyone visit or even call in? She stated she is all alone and when it comes to her dialysis transportation, it is not that she cancels, they usually do.     RN sat and listened with patient, I explained that we will listen to how she is feeling. RN also explained that she had her procedure earlier today and has had some pain medication and it will be important to talk with her nurse tomorrow and explain how she is feeling.     MICU fellow notified, safety attendant ordered for bedside and plan for psych consult tomorrow.

## 2021-08-13 NOTE — Care Plan (Signed)
PT attempted to provide care to [facility] today at 11:47. Care could not be delivered at that time due to off the floor for a procedure/surgery. PT to follow as appropriate.        08/13/21 1147   Therapist Pager   PT Assigned/ Pager # [phone]   Rehab Session   Document Type rehab contact note   Total PT Minutes: 0   Basic Mobility Am-PAC/6Clicks Score (APPROVED Staff)   Patient Mobility Barrier Patient off floor for procedure/test   Physical Therapy Clinical Impression   Criteria for Skilled Therapeutic yes

## 2021-08-13 NOTE — Care Management Notes (Signed)
0943 ~ attempted to see patient to complete intake assessment but she is not in her room, currently in IR.     1435 ~ attempted to see patient to complete intake assessment but she is currently tearful, experiencing a lot of back pain.

## 2021-08-13 NOTE — Care Plan (Signed)
08/13/21 8948   Therapist Pager   OT Assigned/ Pager # Brieann Osinski [phone]   Rehab Session   Document Type rehab contact note   Total OT Minutes: 0   Daily Activity AM-PAC/6-clicks Score   Patient Mobility Barrier Patient off floor for procedure/test  (leaving unit for IR procedure)           Kathryne Hitch, MOT, OTR/L  Occupational Therapist  Pager [phone]

## 2021-08-13 NOTE — Progress Notes (Signed)
Medical Intensive Care Unit  PROGRESS NOTE     Name: Dawn, Malone, 46 y.o. female Date of service: 08/13/2021   Room: 21/A  LOS: 1    MRN: [id number] Attending: Vevelyn Royals*   Code Status: Full Code Admitted to MICU for:  SVC syndrome      SUBJECTIVE  INTERVAL EVENTS:     Pt reports of feeling a bit better compared to yesterday. Pt reports of having transportation issue to be compliant with dialysis schedules. Otherwise, she denies any headache, visual disturbance, nausea,vomiting or abdominal pain.     Pre-round: SBP went up to 200's, 10 mL IV hydralazine was given x once. Started on Vancomycin. Blood culture x 1set is positive for gram positive cocci in clusters, the other set is no growth on prelim result,    OBJECTIVE:    VITAL SIGNS:  Temperature: 36.4 C (97.6 F) BP (Non-Invasive): (!) 166/90 Heart Rate: 69   Respiratory Rate: 13 SpO2: 99 % Weight: 88 kg (194 lb 0.1 oz)        INTAKE & OUTPUT:  Net Since Admission: +139 mL     Intake/Output Summary (Last 24 hours) at 08/13/2021 0827  Last data filed at 08/13/2021 0700  Gross per 24 hour   Intake 139.91 ml   Output --   Net 139.91 ml     Date of Last Bowel Movement:  (6/17; per pt statement)      VENTILATOR SETTINGS:  Ventilator Settings:none           LINES & DRAINS:   Patient Lines/Drains/Airways Status       Active Line / Dialysis Catheter / Dialysis Graft / Drain / Airway / Wound       Name Placement date Placement time Site Days    Peripheral IV Ultrasound guided Right Forearm 08/12/21  1725  -- less than 1    Peripheral IV Proximal;Right Basilic  (medial side of arm) 08/12/21  2205  -- less than 1    AV Fistula Left;Upper Arm 12/05/20  1502  -- 250    Dialysis Catheter 12/05/20  1503  -- 250                      DAILY EXAMINATION:  General: Appears stated age and comfortable at rest   Eyes: EOMI  HENT: MM pink and moist  Lungs: CTA bilaterally   Cardiovascular: RRR with no murmurs appreciated   Abdomen: Soft, non-distended, and non-tender  to palpation; bowel sounds normal.  Extremities: No cyanosis or edema  Integumentary: Skin warm and dry, No rashes  Neurologic: Grossly normal, alert and oriented x3       LABS  IMAGING  MICROBIOLOGY  PATHOLOGY:     I have reviewed all of the recent labs, imaging, microbiology and pathology.    CBC Differential   Recent Labs     08/12/21  0508 08/12/21  1723 08/13/21  0024   WBC 8.4 8.8 9.2   HGB 9.1* 8.1* 7.8*   HCT 27.2* 25.1* 23.9*   PLTCNT 273 256 258    Recent Labs     08/11/21  1330 08/12/21  1723 08/13/21  0024   PMNS 83* 77 72   LYMPHOCYTES 8*  --   --    MONOCYTES 7 9 10    EOSINOPHIL 2  --   --    BASOPHILS 0  0.06 0  <0.10 0  <0.10   PMNABS 11.11* 6.75 6.66   LYMPHSABS 1.06*  1.18 1.50   MONOSABS 0.92 0.82 0.90   EOSABS 0.25 <0.10 <0.10      BMP LFTs   Recent Labs     08/12/21  1723 08/13/21  0024   SODIUM 136  128* 135*   POTASSIUM 4.9 4.8   CHLORIDE 93*  95* 93*   CO2 28 27   BUN 43* 45*   CREATININE 10.92* 11.47*   GLUCOSENF 84 87   ANIONGAP 15* 15*   BUNCRRATIO 4* 4*   GFR 4* 4*   CALCIUM 9.6 9.4   MAGNESIUM 2.1 2.1   PHOSPHORUS 8.6* 9.1*   ALBUMIN 3.6  --     Recent Labs     08/12/21  1723   TOTALPROTEIN 8.0   ALBUMIN 3.6   TOTBILIRUBIN 0.8   BILIRUBINCON 0.2   AST 17   ALT 5*   ALKPHOS 84      CoAgs Blood Gas:   Recent Labs     08/13/21  0024 08/13/21  0741   PROTHROMTME 12.1  --    INR 1.04  --    APTT 122.5* 118.3*    Recent Labs     08/12/21  1723 08/13/21  0741   FI02 32.0 28.0   PH 7.33 7.36   PCO2 61* 53*   PO2 40 53*   BICARBONATE 27.2* 27.1*   BASEEXCESS 4.3* 3.2*       Cardiac Markers Lipid Panel   Recent Labs     08/11/21  1702 08/12/21  1723 08/12/21  1955   TROPONINI 58* 100* 91*   BNP  --  1,719*  --     No results found for this encounter   Urine Analysis Other Labs   Recent Labs     08/12/21  1723   GLUCOSE 90    Recent Labs     08/12/21  1723   CREAPROINFLA 19.1*   TSH 1.427        Blood Cx     CXR 08/13/2021 -   1.Persistent but mildly improved pulmonary edema pattern without  underlying effusion or new cardiopulmonary abnormality.  2.Right IJ approach central venous catheter tip projects over the mid SVC.    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      Active Hospital Problems    Diagnosis    Primary Problem: SVC syndrome    Hypertensive urgency       PLAN:     Respiratory:  Hx Asthma and COPD  Tobacco Use Disorder  On 2 L nasal cannula saturating 97%  Wean oxygen as tolerated  Chest x-ray significant for mild pulmonary edema  Incentive Spirometry   Nicotine patch  Continue DuoNeb      Cardiovascular:  SVC Syndrome secondary to near complete occlusive thrombus  Hx HTN  HFpEF (EF of 55-60% based on Echo done in 2020) on home Imdur   Found to have hypertensive emergency   EKG with first-degree AV block but no acute ischemic changes   Troponin peaks to 58  Restart home Cardizem 120 mg; patient could not recall  additional meds as multiple were duplicate classes  Add 10 mg IV hydralazine PRN is SBP > 160   Most recent HTN with SBP in 200's, will reduce 10-20% in the next hour, goal for SBP <= 160 mmHg in the next 6 hours, then resume all home meds as appropriate there after   Interventional Radiology consulted, appreciate recommendations  Plan for thrombectomy w/ stent placement and replacement of tunneled dialysis catheter on 6/20  Heparin Thrombotic protocol  NPO  Plan to transfer to medicine floor pending clinical status post-op     Neurologic:  No acute abnormalities  Alert and Oriented x3     Renal:  ESRD secondary to Hypertensive Nephropathy   M-W-F  Dialysis  Nephrology Consulted, appreciate recommendations and help  Patient Anuric   Cr 11.47>>>10.92   Renally dose all medications and avoid Nephrotoxins   Continue HD while in in-patients   External catheter order placed to better monitor I&O      Hematology  Chronic Anemia  Baseline Hb of 9   Hb 7.8<<<8.1   No active sign of bleeding   Continue to monitor   Will transfuse if Hb <7 or active bleeding        Infectious Diseases:  No acute concerns at this time  WBC 8.4, CRP 1.3 and Procal 0.41  Blood culture (06/18) x 1 set shows gram + cocci in clusters, x the other set prelim result shows no growth   Started on Vancomycin   Repeat BC   MRSA swab ordered        GI/FEN:  No acute abnormalities   Protonix 40 mg     Endocrinologic:  No acute abnormalities   TSH 1.4 and HA1C 4.8     ___     Diet: NPO for procedure  Fluids: None  Analgesia: None  Sedation: None  DVT/PE Prophylaxis: Heparin thrombotic protocol  Ulcer Prophylaxis: protonix  Glycemic Control: none  Bowel Regimen: Miralax/Senokot   Consults: IR and nephrology  Hardware (Lines, Drains, Foley, Tubes): PIV,  R dialysis catheter, L AV fistula   Activity: Up w/ assistance and Up in chair TID w/ all meals  Therapy: PT/OT       Disposition Planning - TBD    Alfredia Client, MD  Internal Medicine PGY-1      ---------------    I saw and examined the patient independently. Case was discussed with the resident about the history, progress, physical examination, labs, imaging, assessment and plan. I have personally reviewed the imaging studies. I do agree with the above documentation. Any exceptions/additions are edited/noted.  Please see my progress note dated (08/13/21) for additional information.      Luretha Murphy, MD, [facility]  Assistant Professor  Section of Pulmonary, Critical Care & Sleep Medicine  Department of [place]  [phone]

## 2021-08-13 NOTE — Progress Notes (Signed)
Critical Care Note    Date of service: 08/13/2021  Minutes spent in the care of this critically ill patient: 30 minutes with >50% time spent on clinical management of patient  The total time was: continuous    Patients condition has high probability for clinically significant or life-threatening deterioration and requires the highest level of physician preparedness to intervene urgently.    Narrative Summary:   Active ICU Issues Daily Goals Discussed on Rounds    HyperK - resolved  - hx of ESRD; x1 urgent HD   SVC syndrome  IR today   Heparin gtt       Interventions I performed :  [x]   Life support management/intervention   [x]  Lab review   [x]  Radiograph review   [x]  Other data review   [x]  Discussion with I.C.U. team and/or consults     Family discussion regarding history, treatments and treatment decisions  [x]   Patient unable to participate in making treatment decision or providing history (reasons above). Family discussion time included in critical care time above.    MICU Supportive Care Checklist  [x]  Invasive Lines: art line, PIV, av fistula  [x]  Foley Catheter Needs: Foley needed for accurate I/O in critical illness for perfusion monitoring  [x]  Nutrition: NPO  [x]  Bowel Regimen: No BM recorded  [x]  Glycemic Control: Blood Sugars Reviewed  [x]  DVT Prophylaxis: Heparin Drip    Luretha Murphy, MD, [facility]  Assistant Professor  Section of Pulmonary, Critical Care & Sleep Medicine  Department of [place]  [phone]

## 2021-08-13 NOTE — Anesthesia Postprocedure Evaluation (Signed)
Anesthesia Post Op Evaluation    Patient: [name]  Procedure(s):  IR THROMBECTOMY PROCEDURE  IR TDC CHANGE    Last Vitals:Temperature: 36.5 C (97.7 F) (08/13/21 0800)  Heart Rate: 60 (08/13/21 1615)  BP (Non-Invasive): (!) 187/97 (08/13/21 1615)  Respiratory Rate: 14 (08/13/21 1615)  SpO2: 100 % (08/13/21 1615)    No notable events documented.    Patient is sufficiently recovered from the effects of anesthesia to participate in the evaluation and has returned to their pre-procedure level.  Patient location during evaluation: PACU       Patient participation: complete - patient participated  Level of consciousness: awake and alert and responsive to verbal stimuli    Pain management: adequate  Airway patency: patent    Anesthetic complications: no  Cardiovascular status: acceptable  Respiratory status: acceptable  Hydration status: acceptable  Patient post-procedure temperature: Pt Normothermic   PONV Status: Absent

## 2021-08-13 NOTE — Nurses Notes (Signed)
Pt tolerated dialysis TX well , net UF 3500ml . Report given to bedside RN .

## 2021-08-13 NOTE — Pharmacy (Signed)
[facility] / Department of Pharm[facility] Services  Therapeutic Drug Monitoring: Vancomycin  08/13/2021      Patient name: [name], [name]  Date of Birth:  1975-05-03    Actual Weight:  Weight: 88 kg (194 lb 0.1 oz) (08/13/21 0731)     BMI:         Date RPh Current regimen (including mg/kg) Indication &  Organism AUC or trough based dosing Target Levels^ SCr (mg/dL) CrCl* (mL/min) Infectious Laboratory Markers (as applicable)   Measured level(s)   (mcg/mL) Calculated AUC (if AUC based monitoring) Plan & predicted AUC/trough if initial dosing (including when levels are due) Comments   6/20 NLKS Vancomycin to start Empiric, GPC on blood culture 6/18 trough  11.5 ESRD WBC:  Procal:  CRP:  N/A - Load vancomycin 20 mg/kg post iHD today   - No further vancomycin doses until after repeat iHD session  - f/u blood culture from 6/18 and repeat from 6/20                                                                                                   ^Target levels depends on dosing and monitoring method, AUC vs. trough based. For AUC based dosing units are mg*h/L. For trough based dosing units are mcg/mL.     *Creatinine clearance is estimated by using the Cockcroft-Gault equation for adult patients and the Carol Ada for pediatric patients.    The decision to discontinue vancomycin therapy will be determined by the primary service.  Please contact the pharmacist with any questions regarding this patient's medication regimen.

## 2021-08-14 ENCOUNTER — Other Ambulatory Visit: Payer: Self-pay

## 2021-08-14 ENCOUNTER — Inpatient Hospital Stay (HOSPITAL_COMMUNITY): Payer: Commercial Managed Care - PPO

## 2021-08-14 DIAGNOSIS — R0789 Other chest pain: Secondary | ICD-10-CM

## 2021-08-14 DIAGNOSIS — R0600 Dyspnea, unspecified: Secondary | ICD-10-CM

## 2021-08-14 DIAGNOSIS — N186 End stage renal disease: Secondary | ICD-10-CM

## 2021-08-14 DIAGNOSIS — M545 Low back pain, unspecified: Secondary | ICD-10-CM

## 2021-08-14 DIAGNOSIS — I517 Cardiomegaly: Secondary | ICD-10-CM

## 2021-08-14 DIAGNOSIS — E875 Hyperkalemia: Secondary | ICD-10-CM

## 2021-08-14 DIAGNOSIS — R079 Chest pain, unspecified: Secondary | ICD-10-CM

## 2021-08-14 DIAGNOSIS — I120 Hypertensive chronic kidney disease with stage 5 chronic kidney disease or end stage renal disease: Secondary | ICD-10-CM

## 2021-08-14 DIAGNOSIS — R45851 Suicidal ideations: Secondary | ICD-10-CM

## 2021-08-14 DIAGNOSIS — G4733 Obstructive sleep apnea (adult) (pediatric): Secondary | ICD-10-CM

## 2021-08-14 LAB — TRANSTHORACIC ECHOCARDIOGRAM - ADULT
AV LVOT peak gradient: 2 mmHg
AV mean gradient: 2 mmHg
Ao VTI: 23 cm
Ao root annulus: 3.1 cm
Ao root annulus: 3.1 cm
Aortic Valve Area by Continuity of VTI: 2.33 cm2
E wave decelartion time: 333 ms
E/A ratio: 1.3
E/E' ratio: 32.8
LVOT diameter: 2 cm
LVOT stroke volume: 54 cm3
Left Ventricular Outflow Tract Peak Velocity: 69.9 cm/s
MV Comp VTI: 36.4 cm
MV Peak A Vel: 83.7 cm/s
MV Peak E Vel: 109 cm/s
MV mean gradient: 5 mmHg
MV stenosis pressure 1/2 time: 97 ms
Mitral Valve Max Velocity: 139 cm/s
PV peak gradient: 5 mmHg
Pulmonic Valve Acceleration Time: 134 ms
Pulmonic Valve Max Velocity: 112 cm/s
TDI: 3.32 cm/s
TDI: 3.32 cm/s

## 2021-08-14 LAB — MAGNESIUM: MAGNESIUM: 1.9 mg/dL (ref 1.8–2.6)

## 2021-08-14 LAB — BASIC METABOLIC PANEL
ANION GAP: 10 mmol/L (ref 4–13)
BUN/CREA RATIO: 4 — ABNORMAL LOW (ref 6–22)
BUN: 27 mg/dL — ABNORMAL HIGH (ref 8–25)
CALCIUM: 9.4 mg/dL (ref 8.5–10.0)
CHLORIDE: 99 mmol/L (ref 96–111)
CO2 TOTAL: 26 mmol/L (ref 22–30)
CREATININE: 6.65 mg/dL — ABNORMAL HIGH (ref 0.60–1.05)
ESTIMATED GFR: 7 mL/min/BSA — ABNORMAL LOW (ref >=60)
GLUCOSE: 83 mg/dL (ref 65–125)
POTASSIUM: 4.5 mmol/L (ref 3.5–5.1)
SODIUM: 135 mmol/L — ABNORMAL LOW (ref 136–145)

## 2021-08-14 LAB — CBC WITH DIFF
BASOPHIL #: <0.1 10*3/uL (ref <=0.20)
BASOPHIL %: 0 %
EOSINOPHIL #: <0.1 10*3/uL (ref <=0.50)
EOSINOPHIL %: 0 %
HCT: 22.5 % — ABNORMAL LOW (ref 34.8–46.0)
HGB: 7.2 g/dL — ABNORMAL LOW (ref 11.5–16.0)
IMMATURE GRANULOCYTE #: <0.1 10*3/uL (ref <0.10)
IMMATURE GRANULOCYTE %: 1 % (ref 0–1)
LYMPHOCYTE #: 0.95 10*3/uL — ABNORMAL LOW (ref 1.00–4.80)
LYMPHOCYTE %: 9 %
MCH: 29.6 pg (ref 26.0–32.0)
MCHC: 32 g/dL (ref 31.0–35.5)
MCV: 92.6 fL (ref 78.0–100.0)
MONOCYTE #: 1.19 10*3/uL — ABNORMAL HIGH (ref 0.20–1.10)
MONOCYTE %: 11 %
MPV: 9.3 fL (ref 8.7–12.5)
NEUTROPHIL #: 8.68 10*3/uL — ABNORMAL HIGH (ref 1.50–7.70)
NEUTROPHIL %: 79 %
PLATELETS: 220 10*3/uL (ref 150–400)
RBC: 2.43 10*6/uL — ABNORMAL LOW (ref 3.85–5.22)
RDW-CV: 17.6 % — ABNORMAL HIGH (ref 11.5–15.5)
WBC: 10.9 10*3/uL (ref 3.7–11.0)

## 2021-08-14 LAB — PTT (PARTIAL THROMBOPLASTIN TIME)
APTT: 80 seconds — ABNORMAL HIGH (ref 24.2–37.5)
APTT: 93 seconds — ABNORMAL HIGH (ref 24.2–37.5)
APTT: 160.6 seconds (ref 24.2–37.5)
APTT: 118.9 seconds — ABNORMAL HIGH (ref 24.2–37.5)

## 2021-08-14 LAB — PHOSPHORUS: PHOSPHORUS: 6.5 mg/dL — ABNORMAL HIGH (ref 2.4–4.7)

## 2021-08-14 MED ORDER — ALBUTEROL SULFATE 2.5 MG/3 ML (0.083 %) SOLUTION FOR NEBULIZATION
2.5000 mg | INHALATION_SOLUTION | RESPIRATORY_TRACT | Status: DC | PRN
Start: 2021-08-14 — End: 2021-08-19
  Administered 2021-08-14: 2.5 mg via RESPIRATORY_TRACT

## 2021-08-14 MED ORDER — DILTIAZEM CD 120 MG CAPSULE,EXTENDED RELEASE 24 HR
120.0000 mg | ORAL_CAPSULE | Freq: Every day | ORAL | Status: DC
Start: 2021-08-14 — End: 2021-08-17
  Administered 2021-08-14 – 2021-08-17 (×4): 120 mg via ORAL
  Filled 2021-08-14 (×4): qty 1

## 2021-08-14 MED ORDER — ALBUTEROL SULFATE 2.5 MG/3 ML (0.083 %) SOLUTION FOR NEBULIZATION
INHALATION_SOLUTION | RESPIRATORY_TRACT | Status: AC
Start: 2021-08-14 — End: 2021-08-15
  Filled 2021-08-14: qty 3

## 2021-08-14 MED ORDER — POTASSIUM CHLORIDE 2 MEQ/ML INTRAVENOUS SOLUTION
INTRAVENOUS | Status: AC
Start: 2021-08-15 — End: 2021-08-15

## 2021-08-14 MED ORDER — SODIUM CITRATE 4 % (3 ML) INTRA-CATHETER INJECTION SYRINGE
2.0000 | INJECTION | Status: AC
Start: 2021-08-15 — End: 2021-08-15
  Administered 2021-08-15: 2
  Filled 2021-08-14 (×3): qty 6

## 2021-08-14 MED ORDER — LABETALOL 200 MG TABLET
200.0000 mg | ORAL_TABLET | Freq: Two times a day (BID) | ORAL | Status: DC
Start: 2021-08-14 — End: 2021-08-18
  Administered 2021-08-14 – 2021-08-18 (×8): 200 mg via ORAL
  Filled 2021-08-14 (×11): qty 1

## 2021-08-14 MED ORDER — SERTRALINE 50 MG TABLET
25.0000 mg | ORAL_TABLET | Freq: Every day | ORAL | Status: AC
Start: 2021-08-14 — End: 2021-08-16
  Administered 2021-08-14 – 2021-08-16 (×3): 25 mg via ORAL
  Filled 2021-08-14 (×3): qty 1

## 2021-08-14 MED ORDER — DRONABINOL 2.5 MG CAPSULE
2.5000 mg | ORAL_CAPSULE | Freq: Three times a day (TID) | ORAL | Status: DC
Start: 2021-08-14 — End: 2021-08-19
  Administered 2021-08-14 – 2021-08-19 (×16): 2.5 mg via ORAL
  Filled 2021-08-14 (×16): qty 1

## 2021-08-14 MED ORDER — SERTRALINE 50 MG TABLET
50.0000 mg | ORAL_TABLET | Freq: Every day | ORAL | Status: DC
Start: 2021-08-17 — End: 2021-08-19
  Administered 2021-08-17 – 2021-08-19 (×3): 50 mg via ORAL
  Filled 2021-08-14 (×3): qty 1

## 2021-08-14 MED ORDER — MAGNESIUM HYDROXIDE 400 MG/5 ML ORAL SUSPENSION
10.0000 mL | ORAL | Status: DC
Start: 2021-08-14 — End: 2021-08-15
  Administered 2021-08-14: 0 mg via ORAL
  Filled 2021-08-14: qty 30

## 2021-08-14 NOTE — Consults (Signed)
SLEEP MEDICINE CONSULT     Chrissy, Ealey, 46 y.o. female PCP: Cam Hai, FNP   Date of Birth: [birth year] Date of Service: 08/14/2021    Medical Record Number: [id number]      REASON FOR CONSULT: untreated OSA     ASSESSMENT:   Severe OSA reported by pt intolerant of FFM   ESRD on HD,  Body mass index is 30.45 kg/m., chronic back p[ain, COPD/asthma, tobacco use do   RECOMMENDATIONS:   Try CPAP overnight, will provide different mask option for patient  If tolerates and is agreeable, needs to FU with outside pulmonologist who can update sleep study to obtain CPAP again as OP  No need to repeat study as patient reports at least 4 OP sleep studies and reports she has severe OSA     History of Present Illness:    Acelyn Basham is a 46 y.o. 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 Regarding sleep, she reports she has an OP pulmonologist near home in [place] area who has recently evaluated pt for OSA (as well as ongoing COPD/asthma) and recommended CPAP for severe osa. Patient was not using machine due to FFM intolerance and machine was taken back by DME for non compliance. Pt also reports she is not on home continuous oxygen now but in recent years past [place] for [place] use. She endorses multiple OSA sx including snoring, daytime somnolence, witnessed apneas by boyfriend, am headaches, non restorative sleep, frequent nighttime awakenings, nocturnal gasping/choking, initiation insomnia for which she takes hydroxyzine. She states she would try to wear CPAP and mask and would rip it off every time she falls asleep. She says she is a mouth breather so was told no need to try nasal mask.       PREVIOUS SLEEP STUDIES   Sleep screening studies    Will request from [facility] pulmonologist         ROS QUESTIONNAIRES    As per HPI and all other systems negative   STOP-BANG:    Snoring: y  Tired/excessive daytime somnolence: y  Observed apneas: y  Pressure/hypertension: y  Body mass index is 30.45 kg/m. > 35 n  Age/> 69 yo:  n  Neck Circumference: y  Gender female: female       Epworth Sleepiness Scale: 9/24       MEDICAL HISTORY     Allergies   Allergen Reactions    Lisinopril Swelling     Tongue and throat swelling    acetaminophen (TYLENOL) tablet, 650 mg, Oral, Q4H PRN  budesonide-formoterol (SYMBICORT) 160 mcg-4.5 mcg per inhalation oral inhaler - "Respiratory to administer", 2 Puff, Inhalation, 2x/day  cinacalcet (SENSIPAR) tablet, 30 mg, Oral, QPM  cloNIDine (CATAPRES-TTS) transdermal patch 0.3 mg, 0.3 mg, Transdermal, Q7 Days  clopidogrel (PLAVIX) 75 mg tablet, 75 mg, Oral, Daily  cyclobenzaprine (FLEXERIL) tablet, 5 mg, Oral, 2x/day PRN  D5W 250 mL flush bag, , Intravenous, Q15 Min PRN  dilTIAZem (CARDIZEM CD) 24 hr extended release capsule, 120 mg, Oral, Daily  doxazosin (CARDURA) tablet, 4 mg, Oral, QPM  heparin 25,000 units in 0.45% NS 250 mL infusion, 18 Units/kg/hr (Adjusted), Intravenous, Continuous  hydrALAZINE (APRESOLINE) injection 10 mg, 10 mg, Intravenous, Q1H PRN  isosorbide mononitrate (IMDUR) 24 hr extended release tablet, 60  mg, Oral, QAM  labetalol (NORMODYNE) tablet, 200 mg, Oral, Q12H  lidocaine-menthol (LIDOPATCH) 3.6%-1.25% patch, 1 Patch, Transdermal, Daily  lidocaine-prilocaine (EMLA) 2.5%-2.5% topical cream, , Apply Topically, Once  magnesium hydroxide (MILK OF MAGNESIA) 400mg  per 47mL oral liquid, 10 mL, Oral, Now  montelukast (SINGULAIR) 10 mg tablet, 10 mg, Oral, QPM  nicotine (NICODERM CQ) transdermal patch (mg/24 hr), 14 mg, Transdermal, Daily  NS 250 mL flush bag, , Intravenous, Q15 Min PRN  NS flush syringe, 2-6 mL, Intracatheter, Q8HRS  NS flush syringe, 2-6 mL, Intracatheter, Q1 MIN PRN  ondansetron (ZOFRAN) 2 mg/mL injection, 4 mg, Intravenous, Q8H PRN  oxyCODONE (ROXICODONE) immediate  release tablet, 2.5 mg, Oral, Q4H PRN   Or  oxyCODONE (ROXICODONE) immediate release tablet, 5 mg, Oral, Q4H PRN  pantoprazole (PROTONIX) delayed release tablet, 40 mg, Oral, Daily  polyethylene glycol (MIRALAX) oral packet, 17 g, Oral, Daily  sennosides-docusate sodium (SENOKOT-S) 8.6-50mg  per tablet, 1 Tablet, Oral, 2x/day  Vancomycin IV - Pharmacist to Dose per Protocol, , Does not apply, Daily PRN  Vancomycin IV Intermittent Dosing, , Does not apply, Daily PRN      Medications Prior to Admission       Prescriptions    albuterol sulfate (PROVENTIL OR VENTOLIN OR PROAIR) 90 mcg/actuation Inhalation oral inhaler    Take 1-2 Puffs by inhalation Every 6 hours as needed    amoxicillin-pot clavulanate (AUGMENTIN) 500-125 mg Oral Tablet    Take 1 Tablet by mouth Twice daily Take for 10 days; prescription was filled 08-02-21    aspirin (ECOTRIN) 81 mg Oral Tablet, Delayed Release (E.C.)    Take 1 Tablet (81 mg total) by mouth Once a day    budesonide-formoteroL (SYMBICORT) 160-4.5 mcg/actuation Inhalation oral inhaler    Take 2 Puffs by inhalation Twice daily    calcitrioL (ROCALTROL) 0.5 mcg Oral Capsule    Take 2 Capsules (1 mcg total) by mouth Every Monday, Wednesday and Friday    cinacalcet (SENSIPAR) 30 mg Oral Tablet    Take 1 Tablet (30 mg total) by mouth Every evening    cloNIDine (CATAPRES-TTS) 0.3 mg/24 hr Transdermal Patch Weekly    Place 1 Patch (0.3 mg total) on the skin Every 7 days    clopidogreL (PLAVIX) 75 mg Oral Tablet    Take 1 Tablet (75 mg total) by mouth Once a day    cyclobenzaprine (FLEXERIL) 5 mg Oral Tablet    Take 1 Tablet (5 mg total) by mouth Twice per day as needed for Muscle spasms    dilTIAZem (CARDIZEM CD) 120 mg Oral Capsule, Sust. Release 24 hr    Take 1 Capsule (120 mg total) by mouth Once a day    doxazosin (CARDURA) 4 mg Oral Tablet    Take 1 Tablet (4 mg total) by mouth Every evening    ferric citrate (AURYXIA ORAL)    Take 1 g by mouth Three times daily before meals "Take 2 tablets  three times daily before meals and 1 tablet before snacks"    furosemide (LASIX) 80 mg Oral Tablet    Take 1 Tablet (80 mg total) by mouth Twice daily    guaiFENesin (MUCINEX) 600 mg Oral Tablet Extended Release 12hr    Take 1 Tablet (600 mg total) by mouth Every 12 hours    isosorbide mononitrate (IMDUR) 60 mg Oral Tablet Sustained Release 24 hr    Take 1 Tablet (60 mg total) by mouth Every morning    labetaloL (NORMODYNE) 200 mg Oral Tablet  Take 1 Tablet (200 mg total) by mouth Twice daily    loratadine (CLARITIN) 10 mg Oral Tablet    Take 1 Tablet (10 mg total) by mouth Once a day    montelukast (SINGULAIR) 10 mg Oral Tablet    Take 1 Tablet (10 mg total) by mouth Every evening    nitroGLYCERIN (NITROSTAT) 0.4 mg Sublingual Tablet, Sublingual    Place 1 Tablet (0.4 mg total) under the tongue Every 5 minutes for 3 doses for 3 doses over 15 minutes    ondansetron (ZOFRAN) 4 mg Oral Tablet    Take 1 Tablet (4 mg total) by mouth Every 12 hours as needed for Nausea/Vomiting    pantoprazole (PROTONIX) 40 mg Oral Tablet, Delayed Release (E.C.)    Take 1 Tablet (40 mg total) by mouth Once a day           Past Medical History:   Diagnosis Date    Asthma     Chronic diastolic CHF (congestive heart failure) (CMS HCC)     Esophageal reflux     ESRD (end stage renal disease) (CMS HCC)     History of anemia due to CKD     Hypertension     MI (myocardial infarction) (CMS HCC)     Mitral valve regurgitation     Pulmonary edema     Sleep apnea     Tricuspid valve regurgitation     Past Surgical History:   Procedure Laterality Date    ESOPHAGOGASTRODUODENOSCOPY      HX BACK SURGERY      HX CHOLECYSTECTOMY      HX FOOT SURGERY Right     HX HYSTERECTOMY      HX TONSILLECTOMY        Social History     Tobacco Use    Smoking status: Every Day     Packs/day: 0.25     Types: Cigarettes    Smokeless tobacco: Never   Vaping Use    Vaping Use: Never used   Substance Use Topics    Alcohol use: Not Currently    Drug use: Yes     Types:  Marijuana    Family Medical History:       Problem Relation (Age of Onset)    Breast Cancer Mother    Coronary Artery Disease Mother    Diabetes type II Mother    Hypertension (High Blood Pressure) Father                 EXAM   Filed Vitals:    08/14/21 0800 08/14/21 0900 08/14/21 1000 08/14/21 1100   BP: (!) 146/101 (!) 144/96 (!) 122/101    Pulse: 78 74 78    Resp: 13 (!) 21 19    Temp:    36.8 C (98.3 F)   SpO2: 99% 97% 100%         Physical Exam  Vitals signs and nursing note reviewed.   Constitutional:       General: female  is awake. female is not in acute distress.     Appearance: The patient is obese with Body mass index is 30.45 kg/m.      Interventions: Nasal cannula in place.      Comments: The patient appears age  HENT:      Head: Normocephalic and atraumatic.      Nose: Nose normal.      Mouth/Throat: Mallampati III     Mouth: Mucous membranes are moist.  Neck size wide  Eyes:      General: Lids are normal.      Extraocular Movements: Extraocular movements intact.      Conjunctiva/sclera: Conjunctivae normal.   Neck:      Musculoskeletal: Normal range of motion.   Cardiovascular:      Rate and Rhythm: Normal rate and regular rhythm.      Heart sounds: Normal heart sounds. No murmur.   Pulmonary:   Exp wheeze   Few rhonchi  Abdominal:      General: Bowel sounds are normal. There is no distension.      Palpations: Abdomen is soft.      Tenderness: There is no abdominal tenderness.   Musculoskeletal:      Right lower leg:  no pitting edema     Left lower leg:  no pitting edema  Skin:     General: Skin is warm and dry.   Neurological:      General: No focal deficit present.      Mental Status: she is alert and oriented to person, place, and time.   Psychiatric:         Attention and Perception: Attention normal.         Mood and Affect: Mood normal.         Speech: Speech normal.         Behavior: Behavior normal. Behavior is cooperative.        Pertinent labs including ab/vbg if available   BLOOD  GAS  Lab Results   Component Value Date    PH 7.36 08/13/2021    PCO2 53 (H) 08/13/2021    PO2 53 (H) 08/13/2021    BICARBONATE 27.1 (H) 08/13/2021                Other pertinent imaging/studies   TTE    Results for orders placed during the hospital encounter of 08/12/21    TRANSTHORACIC ECHOCARDIOGRAM - ADULT 08/14/2021 11:27 AM    Narrative  **See full report in linked PDF document**  Transthoracic Echocardiographic Report    ______________________________________________________________________________  Name: Herrle, Megin                        MRN: [id number]               Weight: 194 lb  Study Date: 08/14/2021 08:53 AM              DOB: 06/11/1975             Height: 67 in  Gender: Female                               Age: 78 yrs                 BSA: 2.0 m2  Accession #: [id number]                    BP: 119/76 mmHg  Patient Location: [facility]  Ordering Provider: Earna Coder  TechLuvenia Redden    ______________________________________________________________________________  Procedure:  Transthoracic complete echo with contrast, 2D, spectral and tissue Doppler, color flow Doppler, M-mode.    Quality:  Technically difficult study due to limited acoustic windows.    Indications: Dyspnea    Conclusions:  Left Ventricle: Normal left ventricular size. Concentric hypertrophy. Severe left ventricular hypertrophy. The left ventricular ejection fraction by visual assessment  is estimated to be 65%. Left  ventricular systolic function is normal. No segmental/regional wall motion abnormalities identified. Left ventricular diastolic function is indeterminate.  Right Ventricle: The right ventricle is not well visualized. Right ventricular systolic function cannot be assessed. RV systolic pressure could not be determined due to incomplete tricuspid  regurgitation envelope.  Likely hypertensive heart disease. Less likely infiltrative. Correlate clinically.    Findings  Left Ventricle:   Normal left ventricular  size. Concentric hypertrophy. Severe left ventricular hypertrophy. The left ventricular ejection fraction by visual assessment is estimated to be 65%. Left  ventricular systolic function is normal. No segmental/regional wall motion abnormalities identified. Left ventricular diastolic function is indeterminate.  Right Ventricle:   The right ventricle is not well visualized. Right ventricular systolic function cannot be assessed. RV systolic pressure could not be determined due to incomplete tricuspid  regurgitation envelope.  Left Atrium:   The left atrium is not well visualized.  Right Atrium:   The right atrium is not well visualized.  Mitral Valve:   There is mild mitral stenosis. Trace mitral regurgitation present.  Tricuspid Valve:   The tricuspid valve is normal. Trace tricuspid regurgitation present.  Aortic Valve:   The aortic valve is normal. Trileaflet aortic valve. No Aortic valve stenosis. Trace aortic regurgitation present.  Pulmonic Valve:   The pulmonic valve is normal. Trace pulmonic valve regurgitation present.  IVC/Hepatic Veins:   The inferior vena cava was not visualized.  Aorta:   The aortic root is of normal size. The ascending aorta is normal in size.  Pericardium/Pleural space:   There is a trivial pericardial effusion.    Electronically signed by: MD Dorothyann Peng on 08/14/2021 11:27 AM                08/14/2021 12:07   Earle Gell, PA-C  08/14/2021, 12:07    Thank you for involving hospital sleep medicine in this patient's care.  Please call ext [phone] for any questions.

## 2021-08-14 NOTE — Progress Notes (Signed)
Medical Intensive Care Unit  PROGRESS NOTE     Name: Dawn, Malone, 46 y.o. female Date of service: 08/14/2021   Room: 21/A  LOS: 2    MRN: [id number] Attending: Vevelyn Royals*   Code Status: Full Code Admitted to MICU for:  SVC syndrome    Past 24 hours:   PM of 08/13/2021. Pt has SBPs remains in 190s, 200's, refractory to IV hydralazine and Labetalol, started on nicardipine drips which was then discontinued in the AM on 08/14/2021.   Pt has HD session on 08/13/2021 with 3.5 L out   C/o with night team that she has chronic back pain and the medication that works for her is gabapentin which was taken off by nephrologists due to concern for nephrotoxicity. Night team gave 1 dose of gabapentin, pt reports immediately improvement.   Night team put pt on suicidal precautions and sitter at bedside.   SUBJECTIVE  INTERVAL EVENTS:   Pt reports of feeling stable although still has some back pain. Pt is upset being watched all night with sitter. Pt states that she has history of depression but does not want to be on any anti-depression medications. Pt emphasizes that currently she does not have any suicidal thought, ideation or plan and would like the suicidal ideations and sitter to be taken off.   OBJECTIVE:    VITAL SIGNS:  Temperature: 36.9 C (98.4 F) BP (Non-Invasive): 115/73 Heart Rate: 70   Respiratory Rate: 13 SpO2: 99 % Weight: 88.2 kg (194 lb 7.1 oz)        INTAKE & OUTPUT:  Net Since Admission: +139 mL     Intake/Output Summary (Last 24 hours) at 08/14/2021 6578  Last data filed at 08/14/2021 0600  Gross per 24 hour   Intake 1728.53 ml   Output 3960 ml   Net -2231.47 ml     Date of Last Bowel Movement:  (PTA)      VENTILATOR SETTINGS:  Ventilator Settings:none           LINES & DRAINS:   Patient Lines/Drains/Airways Status       Active Line / Dialysis Catheter / Dialysis Graft / Drain / Airway / Wound       Name Placement date Placement time Site Days    Peripheral IV Ultrasound guided Right Forearm  08/12/21  1725  -- 1    Peripheral IV Proximal;Right Basilic  (medial side of arm) 08/12/21  2205  -- 1    AV Fistula Left;Upper Arm 12/05/20  1502  -- 251    Dialysis Catheter Cuffed/Tunneled 08/13/21  1316  -- less than 1                      DAILY EXAMINATION:  General: Appears stated age and comfortable at rest   Eyes: EOMI  HENT: MM pink and moist  Lungs: CTA bilaterally   Cardiovascular: RRR with no murmurs appreciated   Abdomen: Soft, non-distended, and non-tender to palpation; bowel sounds normal.  Extremities: No cyanosis or edema  Integumentary: Skin warm and dry, No rashes  Neurologic: Grossly normal, alert and oriented x3       LABS  IMAGING  MICROBIOLOGY  PATHOLOGY:     I have reviewed all of the recent labs, imaging, microbiology and pathology.    CBC Differential   Recent Labs     08/12/21  1723 08/13/21  0024 08/14/21  0423   WBC 8.8 9.2 10.9   HGB 8.1* 7.8* 7.2*  HCT 25.1* 23.9* 22.5*   PLTCNT 256 258 220    Recent Labs     08/11/21  1330 08/12/21  1723 08/12/21  1723 08/13/21  0024 08/14/21  0423   PMNS 83* 77  --  72 79   LYMPHOCYTES 8*  --   --   --   --    MONOCYTES 7 9  --  10 11   EOSINOPHIL 2  --   --   --   --    BASOPHILS 0  0.06 0  <0.10   < > 0  <0.10 0  <0.10   PMNABS 11.11* 6.75  --  6.66 8.68*   LYMPHSABS 1.06* 1.18  --  1.50 0.95*   MONOSABS 0.92 0.82  --  0.90 1.19*   EOSABS 0.25 <0.10  --  <0.10 <0.10    < > = values in this interval not displayed.      BMP LFTs   Recent Labs     08/14/21  0423   SODIUM 135*   POTASSIUM 4.5   CHLORIDE 99   CO2 26   BUN 27*   CREATININE 6.65*   GLUCOSENF 83   ANIONGAP 10   BUNCRRATIO 4*   GFR 7*   CALCIUM 9.4   MAGNESIUM 1.9   PHOSPHORUS 6.5*    No results found for this encounter   CoAgs Blood Gas:   Recent Labs     08/14/21  0423   APTT 80.0*    Recent Labs     08/13/21  0741   FI02 28.0   PH 7.36   PCO2 53*   PO2 53*   BICARBONATE 27.1*   BASEEXCESS 3.2*       Cardiac Markers Lipid Panel   Recent Labs     08/11/21  1702 08/12/21  1723  08/12/21  1955   TROPONINI 58* 100* 91*   BNP  --  1,719*  --     No results found for this encounter   Urine Analysis Other Labs   No results found for this encounter No results found for this encounter    Invalid input(s): PRL     Blood Cx     CXR 08/14/2021 -   1.Persistent but mildly improved pulmonary edema pattern without underlying effusion or new cardiopulmonary abnormality.  2.Right IJ approach central venous catheter tip projects over the mid SVC.    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 Day 1 post SVC stent and R sided tunnel dialysis catheter replacement on 08/13/2021. Pt is physically and vitally stable. HTN emergency is resolved. Ready to be transferred to medicine floors.      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 s/p SVC stent and tunnel dialysis catheter replacement on 08/13/2021  Hx HTN  HFpEF (EF of 55-60% based on Echo done in 2020) on home Imdur   Found to have hypertensive emergency, currently under control   MI ruled  out   continue 10 mg IV hydralazine PRN is SBP > 160   Interventional Radiology consulted, appreciate recommendations and help   Resume all home anti-HTN med with labetalol 200 mg PO bid, cardizem 120 mg PO daily and clonidine patch 0.3 transdermally weekly   Plan to transfer to medicine floor today   Per IR, continue Plavix and heparin SubQ, discontinue home dose aspirin     Neurologic:  No acute abnormalities  Alert and Oriented x3  Consider to consult psych given pt is very anxious and emotional   Consulted psych re. Suicidal ideation risks and tx option for depression      Renal:  ESRD secondary to Hypertensive Nephropathy   M-W-F Dialysis  Nephrology Consulted, appreciate recommendations and help  Cr 6.65<<<11.47   Renally dose all medications and avoid Nephrotoxins   Continue HD while in in-patients   External catheter order placed to better monitor I&O   Consider to re-engage with case manager prior to discharge to ensure transportation to HD sessions      Hematology  Chronic Anemia  Baseline Hb of 9   Hb 7.2<<<7.8   No active sign of bleeding   Continue to monitor   Will transfuse if Hb <7 or active bleeding        Infectious Diseases:  No acute concerns at this time  WBC 10.9   Blood culture (06/18) x 1 set shows gram + cocci in clusters, x the other set prelim result shows no growth   Continue Vancomycin given pt is at high risk   Repeat BC result pending   MRSA neg         GI/FEN:  No acute abnormalities   Protonix 40 mg     Endocrinologic:  No acute abnormalities   TSH 1.4 and HA1C 4.8     MSK:   -chronic hx of back pain   -per pt, only gabapentin works however it is nephrotoxin   -s/p 1 dose of gabapentin given once by night team on 08/13/2021   -continue tylenol, lidocaine patch   -consulted pain team, await recs   ___     Diet: cardiac diet (pr refuses on renal diet)   Fluids: None  Analgesia: None  Sedation: None  DVT/PE Prophylaxis: Heparin thrombotic protocol  Ulcer Prophylaxis: protonix  Glycemic  Control: none  Bowel Regimen: Miralax/Senokot   Consults: IR, sleep medicine, psych, pain and nephrology  Hardware (Lines, Drains, Foley, Tubes): PIV,  R dialysis catheter, L AV fistula   Activity: Up w/ assistance and Up in chair TID w/ all meals  Therapy: PT/OT       Disposition Planning - TBD    Alfredia Client, MD  Internal Medicine PGY-1      I saw and examined the patient independently. Case was discussed with the residentabout the history, progress, physical examination, labs, imaging, assessment and plan. I have personally reviewed the  imaging studies. I do agree with the above documentation. Any exceptions/additions are edited/noted.  Please see my progress note dated (08/14/21) for additional information.      Luretha Murphy, MD, [facility]  Assistant Professor  Section of Pulmonary, Critical Care & Sleep Medicine  Department of [place]  [phone]

## 2021-08-14 NOTE — Progress Notes (Signed)
Medicine Interval Progress Note    Patient accepted to [facility] as transfer out of MICU.     Brief Hospital Course: [name] is a 46 y/o female PMH of HD dependant ESRD, HTN, CHF, and OSA who presented to outside facility with chest pain. Patient has also missed one HD session due to transportation issues. Patient had CT angio completed that revealed near complete occlusive thrombus of distal SVC adjacent to dialysis catheter. Patient was started on heparin drip and was then accepted to [facility] MICU. Patient also with hypertensive emergency and started on nicardipine drip and given IV hydralazine. Patient transitioned to oral antihypertensives after blood pressure improved. IR consulted and patient was taken for thrombectomy and venoplasty of SVC with stent placement and TCC exchange. Patient had 1/4 positive blood culture on 6/18 for coag negative staph and was started on vancomycin. Repeat blood cultures obtained 6/20 are pending. Pain management consulted on 6/21 for chronic back pain. Psych also consulted on 6/21 for suicidal ideation.     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 s/p SVC stent and tunnel dialysis catheter replacement on 08/13/2021   - s/p IR thrombectomy and stenting and TCC replacement on 6/20   - continue heparin drip and Plavix (discontinue home aspirin)   - consult to discharge pharmacy placed to obtain cost inquiry for eliquis    Hypertensive Emergency, resolved  hx of HTN  Hx of HFpEF not in acute exacerbation   - nicardipine drip discontinued   - home anti hypertensive resumed: labetalol 200 mg PO BID, cardizem 120 mg PO daily, clonidine 0.3 transdermal patch weekly, doxazosin 4 mg PO, Imdur, 60 mg PO   - per MICU note - EF 55-60% in 2020   - repeat TTE done this admission with EF 65%, indeterminate diastolic function, severe left ventricular hypertrophy     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 Patient does not want this restarted at this time    Coagulase negative staph bacteremia vs possible blood culture contaminant   - blood cultures drawn at [facility] with 1/4 bottles positive for gram negative staph   - WBC and procal WNL, CRP mildly elevated however could be secondary to thrombus   - Vanc started on arrival; will continue for now until repeat blood cultures result   - suspect more likely contaminant at this time    OSA   - sleep medicine consulted   - wears 2L NC q HS at home   - CPAP qHS ordered inpatient    Possible Suicidal Ideations   - maintain SI precautions until evaluated by psychiatry   - psych to see today    Chronic back pain   - lidocaine patch, PRN tylenol, flexeril, oxycodone   - pain management consulted   - add 2.5 mg marinol TID    Acute on Chronic Normocytic Anemia   - no current s/s bleeding   - baseline Hgb around 9   - Hgb today 7.2   - transfuse Hgb < 7    Asthma  COPD  Tobacco Use Disorder    - wean oxygen as tolerated    - continue home Symbicort    - continue nicotine patch    - PRN albuterol  nebulizers ordered      Dorthula Nettles, APRN,NP-C    Derenda Mis, MD

## 2021-08-14 NOTE — Progress Notes (Signed)
INTERVENTIONAL RADIOLOGY   PROGRESS NOTE     Pertinent 24 hour events:    6/20: IR performed mechanical thrombectomy, venoplasty and stenting of the SVC, as well as tunneled hemodialysis catheter exchange.    Subjective:  Feels slightly better today. She states that her breast swelling has mildly improved. Repeated that she would like more answers from the treatment teams about plans for any upper extremity graft/fistula in the future, as she is tired of relying on hemodialysis catheters.       Review of Systems:  As per HPI.  Patient denies weight loss, fever, night sweats.  All other systems negative.      Examination  BP (!) 127/113   Pulse 77   Temp 36.8 C (98.3 F)   Resp (!) 25   Wt 88.2 kg (194 lb 7.1 oz)   SpO2 99%   BMI 30.45 kg/m       General: No acute distress.   HEENT: Normocephalic and atraumatic.   Neck: Neck supple. No visible JVD.  Heart: Regular rate and rhythm.   Lungs: Clear to auscultation.  Abdomen: Non-tender, non-tender  Skin: Normal tugor.  Neurological: Alert and oriented x 3.   Psychiatric: Normal mood, affect, and behavior.     Labs  CBC   Recent Labs     08/11/21  1330 08/11/21  2220 08/12/21  0508 08/12/21  1723 08/13/21  0024 08/14/21  0423   WBC 13.4* 9.4 8.4 8.8 9.2 10.9   HGB 6.0* 9.6* 9.1* 8.1* 7.8* 7.2*   HCT 18.6* 27.9* 27.2* 25.1* 23.9* 22.5*   PLTCNT 369 297 273 256 258 220        BMP   Recent Labs     08/11/21  2216 08/12/21  0508 08/12/21  1723 08/13/21  0024 08/14/21  0423   SODIUM 131* 133* 136  128* 135* 135*   POTASSIUM 6.8* 4.0 4.9 4.8 4.5   CHLORIDE 93* 92* 93*  95* 93* 99   CO2 21 25 28 27 26    BUN 73* 39* 43* 45* 27*   CREATININE 14.10* 9.17* 10.92* 11.47* 6.65*   GLUCOSENF 113* 197* 84 87 83   ANIONGAP 17 16 15* 15* 10   BUNCRRATIO 5* 4* 4* 4* 4*   GFR 3* 5* 4* 4* 7*        Recent Labs     08/11/21  2216 08/12/21  0508 08/12/21  1723 08/13/21  0024 08/14/21  0423   CALCIUM 10.6* 10.4* 9.6 9.4 9.4   IONCALCIUM  --   --  1.15  --   --    MAGNESIUM  --    --  2.1 2.1 1.9   PHOSPHORUS  --   --  8.6* 9.1* 6.5*         LFTs   Recent Labs     08/12/21  1723   AST 17   ALT 5*   ALKPHOS 84   TOTBILIRUBIN 0.8   BILIRUBINCON 0.2   TOTALPROTEIN 8.0   ALBUMIN 3.6   No results for input(s): PREALBUMIN, LIPASE, UROBILINOGEN, GAMMAGT, LDH, AMYLASE, AMMONIA in the last 72 hours.    Invalid input(s): GGT       Coagulation Studies   Recent Labs     08/11/21  2314 08/12/21  0916 08/13/21  0024 08/13/21  0741 08/13/21  1426 08/13/21  2203 08/14/21  0423 08/14/21  1039   PROTHROMTME  --   --  12.1  --   --   --   --   --  INR  --   --  1.04  --   --   --   --   --    APTT 79.3* 225.3* 122.5* 118.3* 100.8* 94.8* 80.0* 93.0*        Cardiac Labs   Recent Labs     08/11/21  1702 08/12/21  1723 08/12/21  1955   TROPONINI 58* 100* 91*   BNP  --  1,719*  --             ASSESSMENT & PLAN  66F with history of end stage renal failure on dialysis with SVC syndrome in the setting of large peri-catheter thrombus, now post-procedure day #1 status post mechanical thrombectomy, venoplasty, stenting, and HD catheter exchange.     -Patient doing well post-procedure with mild clinical improvement in symptoms.  -Recommend continuation of heparin gtt, which can be transitioned to outpatient anticoagulation regimen once the patient is otherwise stable for step-down from ICU.  -Patient should receive at least 3-6 months of combination anticoagulation + Plavix  -CTA chest in 6 months to ensure patency of SVC stent      Limmie Patricia, MD  08/14/2021, 13:24  Attending Physician, Interventional Radiology  [facility]  Operated by [facility]  [place] [address]  Dept: [phone]

## 2021-08-14 NOTE — Care Management Notes (Signed)
[place] Management Initial Evaluation    Patient Name: [name]  Date of Birth: 1976/01/23  Sex: female  Date/Time of Admission: 08/12/2021  3:00 PM  Room/Bed: 21/A  Payor: HUMANA MEDICARE / Plan: HUMANA CHOICE PPO / Product Type: PPO /   Primary Care Providers:  Deel, Leona Carry, FNP, FNP (General)    Pharmacy Info:   Preferred [place], [address]    [place] [address]    Phone: [phone] Fax: [phone]    Hours: Not open 24 hours          Emergency Contact Info:   Extended Emergency Contact Information  Primary Emergency Contact: [name]  Mobile Phone: [phone]  Relation: Sister    History:   [name] is a 46 y.o., female, admitted with hypertensive urgency    Height/Weight:   / 88.2 kg (194 lb 7.1 oz)     LOS: 2 days   Admitting Diagnosis: Hypertensive urgency [I16.0]    Assessment:      08/14/21 0845   Assessment Details   Assessment Type Admission   Date of Care Management Update 08/14/21   Date of Next DCP Update 08/16/21   Insurance Information/Type   Insurance type Medicare   Employment/Financial   Patient has Prescription Coverage?  Yes        Name of Insurance Coverage for Medications [place] apartment   Home Safety   Home Assessment: No Problems Identified   Home Accessibility other (see comments);bed and bath on same level   Home Safety Comments PATIENT UTILIZES AN ELEVATOR TO [place] Management Plan   Discharge Planning Status initial meeting   Projected Discharge Date 08/20/21   Discharge plan discussed with: Patient   CM will evaluate for rehabilitation potential yes   Facility or Agency Preferences [facility] [phone],  [place] ([place]), CURRENT WITH AGED AND DISBLED  PROGRAM CASE WORKER IS [name] [phone]   Discharge Needs Assessment   Equipment Currently Used at Home shower chair;cane, straight   Equipment Needed After Discharge other (see comments)  (TBD)   Discharge Facility/Level of Care Needs Undetermined at this time   Transportation Available other (see comments)  (TBD)   Referral Information   Admission Type inpatient   Address Verified verified-no changes   Arrived From acute hospital, other   [place] Other - See Comments  [facility])   ADVANCE DIRECTIVES   Does the Patient have an Advance Directive? No, Information Offered and Given   Patient Requests Assistance in Having Advance Directive Notarized. Yes   LAY CAREGIVER    Appointed Lay Caregiver? I Decline   46 year old female admitted from [facility] with hypertensive urgency. PMH per H&P. This admission, patient ordered labs, imaging, PT/OT, IR consult, nephrology consult,  and psychiatry consult.     Discharge Plan:  Undetermined at this time  Patient lives alone in a single story 4th floor apartment with no entry steps or railing. Patient utilizes an Media planner to access her 4th floor apartment. Patient uses shower chair, and straight cane DME at home and no [employer] agency. Patient receives Palliative Care from [facility] [phone]. Patient also receives assistance from [facility]  [facility] Disabled Program (Case Worker [name] [phone]) and receives assistance with cooking and cleaning. Patient PCP is Dawn Belfast NP and patient last seen in office 07/30/2021. Patient uses [place] and has prescription drug coverage. Patient manages medications. Patient does not have an MPOA/Living Will but would like information to possibly complete one this hospitalization. PT/OT recs pending.     The patient will continue to be evaluated for developing discharge needs.     Case Manager: [place]a Jansky, [place]  Phone: [phone]

## 2021-08-14 NOTE — Care Plan (Signed)
PT attempted to provide care to [facility] today at 14:32. Care could not be delivered at that time due to  PT attempted to see in morning , pt unavailable. PT attempted to see pt in afternoon pt just amb in hallway x 585ft x S with RN and resting now. PT to follow as needed .       08/14/21 1432   Therapist Pager   PT Assigned/ Pager # [phone]   Rehab Session   Document Type rehab contact note   Total PT Minutes: 0   Basic Mobility Am-PAC/6Clicks Score (APPROVED Staff)   Patient Mobility Barrier Patient participating in other care at bedside   Physical Therapy Clinical Impression   Criteria for Skilled Therapeutic yes

## 2021-08-14 NOTE — Ancillary Notes (Signed)
Patient discussed with MICU service for evaluation for transfer and has been assigned to Med Hospitalist 4 service at this time.      Jake Bathe, MD  08/14/2021, 09:30

## 2021-08-14 NOTE — Consults (Signed)
[facility]  Pain Management Consult Service   Initial Consult    Date of Service:  08/14/21  Date of Admission:  08/12/2021    Patient: [name]  MRN: [id number]  Date of Birth:  [birth year]    Primary Service: MH4  Consult Requested By: [facility]  Reason for Consult: pain mgmt     ASSESSMENT:  - Dx:  Chronic back pain, ESRD  - Home opioid regimen: none chronically   - Online review of Controlled Substance Monitoring program:reviewed PDMP   - Opioids administered in last 24 hrs:  Oxycodone 5 mg x 1       RECOMMENDATIONS:   1. Recommend adding Marinol 2.5 mg po TID for pain, pt reports relief with gabapentin but unfortunately unable to use 2/2 kidney function. Marinol will not have any effect on kidney function.   2. Continue oxycodone.       Pain Disposition:   Will continue to follow to assess ongoing care needs.     Please place consult order for IP Consult Pain Management if not already done.  Thank you kindly for your consultation. Please page/call service if needed.     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 Pain consulted for assistance with pain management.   Pt resting in bed this morning, reports generalized upper, mid, lower back pain stating pain is a constant sharp, shooting pain. Pt reports gabapentin has worked well for her pain in the past, denies much relief with opioids. Discussed with pt most of her pain sounds neuropathic in nature, discussed trying Marinol since unable to use gabapentin 2/2 kidney function.     ROS:   All other systems negative except for any noted in the HPI.    Current Hospital  Problem List:   Active Hospital Problems    Diagnosis    Hypertensive urgency    1SVC syndrome       PAST MEDICAL/ FAMILY/ SOCIAL HISTORY:   Reviewed/ Summarized records and/or obtained History from patient, health care provider and history reviewed via medical record  Past Medical History:   Diagnosis Date    Asthma     Chronic diastolic CHF (congestive heart failure) (CMS HCC)     Esophageal reflux     ESRD (end stage renal disease) (CMS HCC)     History of anemia due to CKD     Hypertension     MI (myocardial infarction) (CMS HCC)     Mitral valve regurgitation     Pulmonary edema     Sleep apnea     Tricuspid valve regurgitation          Medications Prior to Admission       Prescriptions    albuterol sulfate (PROVENTIL OR VENTOLIN OR PROAIR) 90 mcg/actuation Inhalation oral inhaler    Take 1-2 Puffs by inhalation Every 6 hours as needed    amoxicillin-pot clavulanate (AUGMENTIN) 500-125 mg Oral Tablet    Take 1 Tablet by mouth Twice daily Take for 10 days; prescription was filled 08-02-21    aspirin (ECOTRIN) 81 mg Oral Tablet, Delayed Release (E.C.)  Take 1 Tablet (81 mg total) by mouth Once a day    budesonide-formoteroL (SYMBICORT) 160-4.5 mcg/actuation Inhalation oral inhaler    Take 2 Puffs by inhalation Twice daily    calcitrioL (ROCALTROL) 0.5 mcg Oral Capsule    Take 2 Capsules (1 mcg total) by mouth Every Monday, Wednesday and Friday    cinacalcet (SENSIPAR) 30 mg Oral Tablet    Take 1 Tablet (30 mg total) by mouth Every evening    cloNIDine (CATAPRES-TTS) 0.3 mg/24 hr Transdermal Patch Weekly    Place 1 Patch (0.3 mg total) on the skin Every 7 days    clopidogreL (PLAVIX) 75 mg Oral Tablet    Take 1 Tablet (75 mg total) by mouth Once a day    cyclobenzaprine (FLEXERIL) 5 mg Oral Tablet    Take 1 Tablet (5 mg total) by mouth Twice per day as needed for Muscle spasms    dilTIAZem (CARDIZEM CD) 120 mg Oral Capsule, Sust. Release 24 hr    Take 1 Capsule (120 mg total) by mouth Once a day    doxazosin  (CARDURA) 4 mg Oral Tablet    Take 1 Tablet (4 mg total) by mouth Every evening    ferric citrate (AURYXIA ORAL)    Take 1 g by mouth Three times daily before meals "Take 2 tablets three times daily before meals and 1 tablet before snacks"    furosemide (LASIX) 80 mg Oral Tablet    Take 1 Tablet (80 mg total) by mouth Twice daily    guaiFENesin (MUCINEX) 600 mg Oral Tablet Extended Release 12hr    Take 1 Tablet (600 mg total) by mouth Every 12 hours    isosorbide mononitrate (IMDUR) 60 mg Oral Tablet Sustained Release 24 hr    Take 1 Tablet (60 mg total) by mouth Every morning    labetaloL (NORMODYNE) 200 mg Oral Tablet    Take 1 Tablet (200 mg total) by mouth Twice daily    loratadine (CLARITIN) 10 mg Oral Tablet    Take 1 Tablet (10 mg total) by mouth Once a day    montelukast (SINGULAIR) 10 mg Oral Tablet    Take 1 Tablet (10 mg total) by mouth Every evening    nitroGLYCERIN (NITROSTAT) 0.4 mg Sublingual Tablet, Sublingual    Place 1 Tablet (0.4 mg total) under the tongue Every 5 minutes for 3 doses for 3 doses over 15 minutes    ondansetron (ZOFRAN) 4 mg Oral Tablet    Take 1 Tablet (4 mg total) by mouth Every 12 hours as needed for Nausea/Vomiting    pantoprazole (PROTONIX) 40 mg Oral Tablet, Delayed Release (E.C.)    Take 1 Tablet (40 mg total) by mouth Once a day           acetaminophen (TYLENOL) tablet, 650 mg, Oral, Q4H PRN  budesonide-formoterol (SYMBICORT) 160 mcg-4.5 mcg per inhalation oral inhaler - "Respiratory to administer", 2 Puff, Inhalation, 2x/day  cinacalcet (SENSIPAR) tablet, 30 mg, Oral, QPM  cloNIDine (CATAPRES-TTS) transdermal patch 0.3 mg, 0.3 mg, Transdermal, Q7 Days  clopidogrel (PLAVIX) 75 mg tablet, 75 mg, Oral, Daily  cyclobenzaprine (FLEXERIL) tablet, 5 mg, Oral, 2x/day PRN  D5W 250 mL flush bag, , Intravenous, Q15 Min PRN  dilTIAZem (CARDIZEM CD) 24 hr extended release capsule, 120 mg, Oral, Daily  doxazosin (CARDURA) tablet, 4 mg, Oral, QPM  heparin 25,000 units in 0.45% NS 250 mL  infusion, 18 Units/kg/hr (Adjusted), Intravenous, Continuous  hydrALAZINE (APRESOLINE) injection 10 mg, 10 mg, Intravenous,  Q1H PRN  isosorbide mononitrate (IMDUR) 24 hr extended release tablet, 60 mg, Oral, QAM  labetalol (NORMODYNE) tablet, 200 mg, Oral, Q12H  lidocaine-menthol (LIDOPATCH) 3.6%-1.25% patch, 1 Patch, Transdermal, Daily  lidocaine-prilocaine (EMLA) 2.5%-2.5% topical cream, , Apply Topically, Once  magnesium hydroxide (MILK OF MAGNESIA) 400mg  per 47mL oral liquid, 10 mL, Oral, Now  montelukast (SINGULAIR) 10 mg tablet, 10 mg, Oral, QPM  nicotine (NICODERM CQ) transdermal patch (mg/24 hr), 14 mg, Transdermal, Daily  NS 250 mL flush bag, , Intravenous, Q15 Min PRN  NS flush syringe, 2-6 mL, Intracatheter, Q8HRS  NS flush syringe, 2-6 mL, Intracatheter, Q1 MIN PRN  ondansetron (ZOFRAN) 2 mg/mL injection, 4 mg, Intravenous, Q8H PRN  oxyCODONE (ROXICODONE) immediate release tablet, 2.5 mg, Oral, Q4H PRN   Or  oxyCODONE (ROXICODONE) immediate release tablet, 5 mg, Oral, Q4H PRN  pantoprazole (PROTONIX) delayed release tablet, 40 mg, Oral, Daily  polyethylene glycol (MIRALAX) oral packet, 17 g, Oral, Daily  sennosides-docusate sodium (SENOKOT-S) 8.6-50mg  per tablet, 1 Tablet, Oral, 2x/day  Vancomycin IV - Pharmacist to Dose per Protocol, , Does not apply, Daily PRN  Vancomycin IV Intermittent Dosing, , Does not apply, Daily PRN      Allergies   Allergen Reactions    Lisinopril Swelling     Tongue and throat swelling     Past Surgical History:   Procedure Laterality Date    ESOPHAGOGASTRODUODENOSCOPY      HX BACK SURGERY      HX CHOLECYSTECTOMY      HX FOOT SURGERY Right     HX HYSTERECTOMY      HX TONSILLECTOMY           Social History     Tobacco Use    Smoking status: Every Day     Packs/day: 0.25     Types: Cigarettes    Smokeless tobacco: Never   Vaping Use    Vaping Use: Never used   Substance Use Topics    Alcohol use: Not Currently    Drug use: Yes     Types: Marijuana     Family Medical History:        Problem Relation (Age of Onset)    Breast Cancer Mother    Coronary Artery Disease Mother    Diabetes type II Mother    Hypertension (High Blood Pressure) Father              PHYSICAL EXAMINATION:  BP (!) 122/101   Pulse 78   Temp 36.8 C (98.2 F)   Resp 19   Wt 88.2 kg (194 lb 7.1 oz)   SpO2 100%   BMI 30.45 kg/m      Body mass index is 30.45 kg/m.  GENERAL:The patient appears stated age, pleasant, appropriately groomed, in no acute distress.   PSYCHIATRIC: Alert & Oriented x 3. Judgment /insight normal, mood/affect normal.   EENT: non icteric, pupils-equal and round, MMM  RESPIRATORY: No accessory muscles, normal effort  CARDIOVASCULAR: RRR  SKIN: No rashes noted. Warm and dry.    ABDOMEN Soft, non-tender, non-distended.  MUSCULOSKELETAL/NEUROLOGICAL:  Gait-not observed    Labs Ordered/ Reviewed :   I have reviewed pertinent clinical lab tests including CMP, CBC, AST/ALT. Pertinent abnormal findings noted.     No results for input(s): ETHANOLSERUM in the last 72 hours.  No results for input(s): AMPHETUR, BARBUR, BENZOUR, COCMETUR, METHADONEUR, OPIATEURINE, PHENCYCUR, PROPOXYPHEUR, CANNABINUR in the last 72 hours.    Diagnostic/Radiology Tests Reviewed:  Reviewed  Kylie A Bias, APRN 08/14/2021, 11:53            The was visit conducted independently by the provider above.  The note was authored independently by the provider above.  Unless directly noted by me in the chart, I did not evaluate the patient or participate in formulating the plan of care.  Signature required by employer.    Chipper Oman, MD

## 2021-08-14 NOTE — Progress Notes (Signed)
Critical Care Note    Date of service: 08/14/2021  Minutes spent in the care of this critically ill patient: 30 minutes with >50% time spent on clinical management of patient  The total time was: continuous    Patients condition has high probability for clinically significant or life-threatening deterioration and requires the highest level of physician preparedness to intervene urgently.    Narrative Summary:   Active ICU Issues Daily Goals Discussed on Rounds    HyperK - resolved  - hx of ESRD; x1 urgent HD   SVC syndrome  - s/p thrombectomy + stenting 6/20   Untreated OSA   HTN   Bacteremia; likely contaminant    Heparin gtt   Sleep med c/s   Qhs NIV   Wean O2, aim SpO2 90-94%   Optimize HTN   PT/OT   Rpt Bcx   deline   Transfer out of ICU today     Interventions I performed :  [x]   Life support management/intervention   [x]  Lab review   [x]  Radiograph review   [x]  Other data review   [x]  Discussion with I.C.U. team and/or consults     Family discussion regarding history, treatments and treatment decisions  [x]   Patient unable to participate in making treatment decision or providing history (reasons above). Family discussion time included in critical care time above.    MICU Supportive Care Checklist  [x]  Invasive Lines: PIV, av fistula, dialysis line  [x]  Foley Catheter Needs: none  [x]  Nutrition: renal diet  [x]  Bowel Regimen: No BM recorded  [x]  Glycemic Control: Blood Sugars Reviewed  [x]  DVT Prophylaxis: Heparin Drip    Luretha Murphy, MD, [facility]  Assistant Professor  Section of Pulmonary, Critical Care & Sleep Medicine  Department of [place]  [phone]
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
## 2021-08-14 NOTE — Care Plan (Signed)
08/14/21 1035   Therapist Pager   OT Assigned/ Pager # Branden Shallenberger [phone]   Rehab Session   Document Type rehab contact note   Total OT Minutes: 0   Daily Activity AM-PAC/6-clicks Score   Patient Mobility Barrier Patient participating in other care at bedside  (x 2 attempts)           Kathryne Hitch, MOT, OTR/L  Warden/ranger  Pager [phone]

## 2021-08-14 NOTE — Consults (Signed)
[facility]  Discharge Prescription Cost Inquiry    Medication & dose Quantity Day supply Insurance plan(s) used for cost inquiry Cost to patient Explanation Comments    Eliquis Starter Pack 1 pack 30 [employer] $0 Covered None         Please note: The information on this page is an estimate as provided by the [facility] only. The actual out-of-pocket costs may be different than the estimated amount shown. The information provided is to support the medical team's understanding of potential out-of-pocket costs for specified prescription medications. This is not a quote, nor is it a guarantee of payment or benefits for healthcare services. If the patient has insurance, [employer] (including deductibles, co-pay, co-insurance, out-of-pocket maximums, covered services, and medical necessity) will further determine the amount owed. Benefits and eligibility are subject to change at any time. Prices seen here do not reflect any copay cards, coupons, or patient assistance programs unless specifically noted.        Prescription cost  inquiry run on 08/14/2021 at 11:54       Please contact the [facility] at extension [phone] or [phone] for any questions.

## 2021-08-15 ENCOUNTER — Inpatient Hospital Stay (HOSPITAL_COMMUNITY): Payer: Commercial Managed Care - PPO

## 2021-08-15 DIAGNOSIS — T827XXA Infection and inflammatory reaction due to other cardiac and vascular devices, implants and grafts, initial encounter: Secondary | ICD-10-CM

## 2021-08-15 DIAGNOSIS — F1290 Cannabis use, unspecified, uncomplicated: Secondary | ICD-10-CM

## 2021-08-15 DIAGNOSIS — D649 Anemia, unspecified: Secondary | ICD-10-CM

## 2021-08-15 DIAGNOSIS — F4310 Post-traumatic stress disorder, unspecified: Secondary | ICD-10-CM

## 2021-08-15 DIAGNOSIS — Z1389 Encounter for screening for other disorder: Secondary | ICD-10-CM

## 2021-08-15 DIAGNOSIS — F329 Major depressive disorder, single episode, unspecified: Secondary | ICD-10-CM

## 2021-08-15 DIAGNOSIS — F419 Anxiety disorder, unspecified: Secondary | ICD-10-CM

## 2021-08-15 DIAGNOSIS — D631 Anemia in chronic kidney disease: Secondary | ICD-10-CM

## 2021-08-15 DIAGNOSIS — J811 Chronic pulmonary edema: Secondary | ICD-10-CM

## 2021-08-15 DIAGNOSIS — Z9151 Personal history of suicidal behavior: Secondary | ICD-10-CM

## 2021-08-15 LAB — MRSA/MSSA MOLECULAR SCREEN, BLOOD
METHICILLIN RESISTANCE: NEGATIVE
STAPHYLOCOCCUS AUREUS: NEGATIVE

## 2021-08-15 LAB — PTT (PARTIAL THROMBOPLASTIN TIME)
APTT: 61.3 seconds — ABNORMAL HIGH (ref 24.2–37.5)
APTT: 86.2 seconds — ABNORMAL HIGH (ref 24.2–37.5)

## 2021-08-15 LAB — BASIC METABOLIC PANEL
ANION GAP: 14 mmol/L — ABNORMAL HIGH (ref 4–13)
BUN/CREA RATIO: 4 — ABNORMAL LOW (ref 6–22)
BUN: 42 mg/dL — ABNORMAL HIGH (ref 8–25)
CALCIUM: 9.4 mg/dL (ref 8.5–10.0)
CHLORIDE: 95 mmol/L — ABNORMAL LOW (ref 96–111)
CO2 TOTAL: 25 mmol/L (ref 22–30)
CREATININE: 9.5 mg/dL — ABNORMAL HIGH (ref 0.60–1.05)
ESTIMATED GFR: 5 mL/min/BSA — ABNORMAL LOW (ref >=60)
GLUCOSE: 79 mg/dL (ref 65–125)
POTASSIUM: 5.1 mmol/L (ref 3.5–5.1)
SODIUM: 134 mmol/L — ABNORMAL LOW (ref 136–145)

## 2021-08-15 LAB — CBC WITH DIFF
BASOPHIL #: <0.1 10*3/uL (ref <=0.20)
BASOPHIL %: 1 %
EOSINOPHIL #: 0.26 10*3/uL (ref <=0.50)
EOSINOPHIL %: 2 %
HCT: 21.9 % — ABNORMAL LOW (ref 34.8–46.0)
HGB: 7.2 g/dL — ABNORMAL LOW (ref 11.5–16.0)
IMMATURE GRANULOCYTE #: 0.1 10*3/uL — ABNORMAL HIGH (ref <0.10)
IMMATURE GRANULOCYTE %: 1 % (ref 0–1)
LYMPHOCYTE #: 2.24 10*3/uL (ref 1.00–4.80)
LYMPHOCYTE %: 21 %
MCH: 30.3 pg (ref 26.0–32.0)
MCHC: 32.9 g/dL (ref 31.0–35.5)
MCV: 92 fL (ref 78.0–100.0)
MONOCYTE #: 1.02 10*3/uL (ref 0.20–1.10)
MONOCYTE %: 10 %
MPV: 9.3 fL (ref 8.7–12.5)
NEUTROPHIL #: 7.02 10*3/uL (ref 1.50–7.70)
NEUTROPHIL %: 65 %
PLATELETS: 243 10*3/uL (ref 150–400)
RBC: 2.38 10*6/uL — ABNORMAL LOW (ref 3.85–5.22)
RDW-CV: 17.9 % — ABNORMAL HIGH (ref 11.5–15.5)
WBC: 10.7 10*3/uL (ref 3.7–11.0)

## 2021-08-15 LAB — MAGNESIUM: MAGNESIUM: 2 mg/dL (ref 1.8–2.6)

## 2021-08-15 LAB — PHOSPHORUS: PHOSPHORUS: 7.1 mg/dL — ABNORMAL HIGH (ref 2.4–4.7)

## 2021-08-15 MED ORDER — APIXABAN 5 MG TABLET
10.0000 mg | ORAL_TABLET | Freq: Two times a day (BID) | ORAL | Status: DC
Start: 2021-08-15 — End: 2021-08-19
  Administered 2021-08-15 – 2021-08-19 (×9): 10 mg via ORAL
  Filled 2021-08-15 (×9): qty 2

## 2021-08-15 MED ORDER — VANCOMYCIN 1 GRAM/200 ML IN DEXTROSE 5 % INTRAVENOUS PIGGYBACK
15.0000 mg/kg | INJECTION | Freq: Once | INTRAVENOUS | Status: DC
Start: 2021-08-15 — End: 2021-08-15

## 2021-08-15 MED ORDER — SODIUM CHLORIDE 0.9 % INTRAVENOUS SOLUTION
125.0000 mg | INTRAVENOUS | Status: AC
Start: 2021-08-15 — End: 2021-08-15
  Administered 2021-08-15: 125 mg via INTRAVENOUS
  Administered 2021-08-15: 0 mg via INTRAVENOUS
  Filled 2021-08-15: qty 10

## 2021-08-15 MED ORDER — SODIUM CITRATE 4 % (3 ML) INTRA-CATHETER INJECTION SYRINGE
2.0000 | INJECTION | Freq: Once | Status: AC
Start: 2021-08-15 — End: 2021-08-15
  Administered 2021-08-15: 2
  Filled 2021-08-15: qty 6

## 2021-08-15 MED ORDER — VANCOMYCIN IV - PHARMACIST TO DOSE PER PROTOCOL - NO EXCLUSION CRITERIA
Freq: Every day | Status: DC | PRN
Start: 2021-08-15 — End: 2021-08-15

## 2021-08-15 MED ORDER — VANCOMYCIN 1 GRAM/200 ML IN DEXTROSE 5 % INTRAVENOUS PIGGYBACK
15.0000 mg/kg | INJECTION | INTRAVENOUS | Status: AC
Start: 2021-08-15 — End: 2021-08-15
  Administered 2021-08-15: 1000 mg via INTRAVENOUS
  Administered 2021-08-15: 0 mg via INTRAVENOUS
  Filled 2021-08-15: qty 200

## 2021-08-15 MED ORDER — APIXABAN 5 MG TABLET
5.0000 mg | ORAL_TABLET | Freq: Two times a day (BID) | ORAL | Status: DC
Start: 2021-08-22 — End: 2021-08-19

## 2021-08-15 NOTE — Consults (Signed)
SLEEP MEDICINE CONSULT     Alba, Perillo, 46 y.o. female PCP: Cam Hai, FNP   Date of Birth: 08-28-1975 Date of Service: 08/15/2021    Medical Record Number: [id number]      REASON FOR CONSULT: untreated OSA     ASSESSMENT:   Severe OSA reported by pt intolerant of FFM   ESRD on HD,  Body mass index is 30.45 kg/m., chronic back p[ain, COPD/asthma, tobacco use do   RECOMMENDATIONS:   Pt tolerated CPAP well with nasal mask provided.  If tolerates and is agreeable, needs to FU with outside pulmonologist who can update sleep study to obtain CPAP again as OP  No need to repeat study as patient reports at least 4 OP sleep studies and reports she has severe OSA     History of Present Illness from initial visit:    Ellar Hakala is a 46 y.o. 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 Regarding sleep, she reports she has an OP pulmonologist near home in [facility] area who has recently evaluated pt for OSA (as well as ongoing COPD/asthma) and recommended CPAP for severe osa. Patient was not using machine due to FFM intolerance and machine was taken back by DME for non compliance. Pt also reports she is not on home continuous oxygen now but in recent years past she had [place] oxygen for home use. She endorses multiple OSA sx including snoring, daytime somnolence, witnessed apneas by boyfriend, am headaches, non restorative sleep, frequent nighttime awakenings, nocturnal gasping/choking, initiation insomnia for which she takes hydroxyzine. She states she would try to wear CPAP and mask and would rip it off every time she falls asleep. She says she is a mouth breather so was told no need to try nasal mask.    08/15/21 interval update    Sleep report  CPAP 8 cmh20  Residual ahi 9.9  Used CPAP 6.1 hours    Pt reports she liked nasal  mask better than her prior attempts with FFM at home. She does also report she played games on her cell phone all night and did not sleep much overnight. She is agreeable to continue using CPAP qhs. She has future appt with pulmonologist at home in the future but unsure of date.     Called patient's pulmonologist  She does not have appt scheduled yet for fu  They will fax me PSG report  They requested we call before to schedule fu with them closer to discharge       PREVIOUS SLEEP STUDIES   Sleep screening studies    Will request from [facility] pulmonologist  Dr Tsosie Billing [phone]  Called 08/15/21         ROS QUESTIONNAIRES   As per HPI and all other systems negative   STOP-BANG:    Snoring: y  Tired/excessive daytime somnolence: y  Observed apneas: y  Pressure/hypertension: y  Body mass index is 30.45 kg/m. > 35 n  Age/> 89 yo:  n  Neck Circumference: y  Gender female: female       Epworth Sleepiness Scale: 9/24       MEDICAL HISTORY     Allergies   Allergen Reactions    Lisinopril Swelling     Tongue and throat swelling    acetaminophen (TYLENOL)  tablet, 650 mg, Oral, Q4H PRN  albuterol (PROVENTIL) 2.5 mg / 3 mL (0.083%) neb solution, 2.5 mg, Nebulization, Q4H PRN  apixaban (ELIQUIS) tablet, 10 mg, Oral, 2x/day   Followed by  Derrill Memo ON 08/22/2021] apixaban (ELIQUIS) tablet, 5 mg, Oral, 2x/day  budesonide-formoterol (SYMBICORT) 160 mcg-4.5 mcg per inhalation oral inhaler - "Respiratory to administer", 2 Puff, Inhalation, 2x/day  cinacalcet (SENSIPAR) tablet, 30 mg, Oral, QPM  cloNIDine (CATAPRES-TTS) transdermal patch 0.3 mg, 0.3 mg, Transdermal, Q7 Days  clopidogrel (PLAVIX) 75 mg tablet, 75 mg, Oral, Daily  cyclobenzaprine (FLEXERIL) tablet, 5 mg, Oral, 2x/day PRN  D5W 250 mL flush bag, , Intravenous, Q15 Min PRN  dilTIAZem (CARDIZEM CD) 24 hr extended release capsule, 120 mg, Oral, Daily  doxazosin (CARDURA) tablet, 4 mg, Oral, QPM  dronabinol (MARINOL) capsule, 2.5 mg, Oral, 3x/day  hydrALAZINE  (APRESOLINE) injection 10 mg, 10 mg, Intravenous, Q1H PRN  isosorbide mononitrate (IMDUR) 24 hr extended release tablet, 60 mg, Oral, QAM  labetalol (NORMODYNE) tablet, 200 mg, Oral, Q12H  lidocaine-menthol (LIDOPATCH) 3.6%-1.25% patch, 1 Patch, Transdermal, Daily  montelukast (SINGULAIR) 10 mg tablet, 10 mg, Oral, QPM  nicotine (NICODERM CQ) transdermal patch (mg/24 hr), 14 mg, Transdermal, Daily  NS 250 mL flush bag, , Intravenous, Q15 Min PRN  NS flush syringe, 2-6 mL, Intracatheter, Q8HRS  NS flush syringe, 2-6 mL, Intracatheter, Q1 MIN PRN  ondansetron (ZOFRAN) 2 mg/mL injection, 4 mg, Intravenous, Q8H PRN  oxyCODONE (ROXICODONE) immediate release tablet, 2.5 mg, Oral, Q4H PRN   Or  oxyCODONE (ROXICODONE) immediate release tablet, 5 mg, Oral, Q4H PRN  pantoprazole (PROTONIX) delayed release tablet, 40 mg, Oral, Daily  polyethylene glycol (MIRALAX) oral packet, 17 g, Oral, Daily  sennosides-docusate sodium (SENOKOT-S) 8.6-50mg  per tablet, 1 Tablet, Oral, 2x/day  sertraline (ZOLOFT) tablet, 25 mg, Oral, Daily   Followed by  Derrill Memo ON 08/17/2021] sertraline (ZOLOFT) tablet, 50 mg, Oral, Daily  Vancomycin IV - Pharmacist to Dose per Protocol, , Does not apply, Daily PRN  Vancomycin IV Intermittent Dosing, , Does not apply, Daily PRN      Medications Prior to Admission       Prescriptions    albuterol sulfate (PROVENTIL OR VENTOLIN OR PROAIR) 90 mcg/actuation Inhalation oral inhaler    Take 1-2 Puffs by inhalation Every 6 hours as needed    amoxicillin-pot clavulanate (AUGMENTIN) 500-125 mg Oral Tablet    Take 1 Tablet by mouth Twice daily Take for 10 days; prescription was filled 08-02-21    aspirin (ECOTRIN) 81 mg Oral Tablet, Delayed Release (E.C.)    Take 1 Tablet (81 mg total) by mouth Once a day    budesonide-formoteroL (SYMBICORT) 160-4.5 mcg/actuation Inhalation oral inhaler    Take 2 Puffs by inhalation Twice daily    calcitrioL (ROCALTROL) 0.5 mcg Oral Capsule    Take 2 Capsules (1 mcg total) by mouth Every  Monday, Wednesday and Friday    cinacalcet (SENSIPAR) 30 mg Oral Tablet    Take 1 Tablet (30 mg total) by mouth Every evening    cloNIDine (CATAPRES-TTS) 0.3 mg/24 hr Transdermal Patch Weekly    Place 1 Patch (0.3 mg total) on the skin Every 7 days    clopidogreL (PLAVIX) 75 mg Oral Tablet    Take 1 Tablet (75 mg total) by mouth Once a day    cyclobenzaprine (FLEXERIL) 5 mg Oral Tablet    Take 1 Tablet (5 mg total) by mouth Twice per day as needed for Muscle spasms    dilTIAZem (CARDIZEM CD)  120 mg Oral Capsule, Sust. Release 24 hr    Take 1 Capsule (120 mg total) by mouth Once a day    doxazosin (CARDURA) 4 mg Oral Tablet    Take 1 Tablet (4 mg total) by mouth Every evening    ferric citrate (AURYXIA ORAL)    Take 1 g by mouth Three times daily before meals "Take 2 tablets three times daily before meals and 1 tablet before snacks"    furosemide (LASIX) 80 mg Oral Tablet    Take 1 Tablet (80 mg total) by mouth Twice daily    guaiFENesin (MUCINEX) 600 mg Oral Tablet Extended Release 12hr    Take 1 Tablet (600 mg total) by mouth Every 12 hours    isosorbide mononitrate (IMDUR) 60 mg Oral Tablet Sustained Release 24 hr    Take 1 Tablet (60 mg total) by mouth Every morning    labetaloL (NORMODYNE) 200 mg Oral Tablet    Take 1 Tablet (200 mg total) by mouth Twice daily    loratadine (CLARITIN) 10 mg Oral Tablet    Take 1 Tablet (10 mg total) by mouth Once a day    montelukast (SINGULAIR) 10 mg Oral Tablet    Take 1 Tablet (10 mg total) by mouth Every evening    nitroGLYCERIN (NITROSTAT) 0.4 mg Sublingual Tablet, Sublingual    Place 1 Tablet (0.4 mg total) under the tongue Every 5 minutes for 3 doses for 3 doses over 15 minutes    ondansetron (ZOFRAN) 4 mg Oral Tablet    Take 1 Tablet (4 mg total) by mouth Every 12 hours as needed for Nausea/Vomiting    pantoprazole (PROTONIX) 40 mg Oral Tablet, Delayed Release (E.C.)    Take 1 Tablet (40 mg total) by mouth Once a day           Past Medical History:   Diagnosis Date     Asthma     Chronic diastolic CHF (congestive heart failure) (CMS HCC)     Esophageal reflux     ESRD (end stage renal disease) (CMS HCC)     History of anemia due to CKD     Hypertension     MI (myocardial infarction) (CMS HCC)     Mitral valve regurgitation     Pulmonary edema     Sleep apnea     Tricuspid valve regurgitation     Past Surgical History:   Procedure Laterality Date    ESOPHAGOGASTRODUODENOSCOPY      HX BACK SURGERY      HX CHOLECYSTECTOMY      HX FOOT SURGERY Right     HX HYSTERECTOMY      HX TONSILLECTOMY        Social History     Tobacco Use    Smoking status: Every Day     Packs/day: 0.25     Types: Cigarettes    Smokeless tobacco: Never   Vaping Use    Vaping Use: Never used   Substance Use Topics    Alcohol use: Not Currently    Drug use: Yes     Types: Marijuana    Family Medical History:       Problem Relation (Age of Onset)    Breast Cancer Mother    Coronary Artery Disease Mother    Diabetes type II Mother    Hypertension (High Blood Pressure) Father                 EXAM   Danley Danker  Vitals:    08/15/21 0700 08/15/21 0745 08/15/21 1200 08/15/21 1300   BP: (!) 154/126 (!) 149/96  (!) 162/109   Pulse: 77 67  78   Resp: 14   13   Temp:   37 C (98.6 F)    SpO2: (!) 89%   97%        Physical Exam  Vitals signs and nursing note reviewed.   Constitutional:       General: female  is awake. female is not in acute distress.     Appearance: The patient has Body mass index is 30.45 kg/m.      Interventions: RA     Comments: The patient appears age  HENT:      Head: Normocephalic and atraumatic.      Nose: Nose normal.      Mouth/Throat: Mallampati III     Mouth: Mucous membranes are moist.   Neck size wide  Eyes:      General: Lids are normal.      Extraocular Movements: Extraocular movements intact.      Conjunctiva/sclera: Conjunctivae normal.   Neck:      Musculoskeletal: Normal range of motion.   Musculoskeletal:      Right lower leg:  no pitting edema     Left lower leg:  no pitting edema  Skin:      General: Skin is warm and dry.   Neurological:      General: No focal deficit present.      Mental Status: she is alert and oriented to person, place, and time.   Psychiatric:      Affect depressed (more so today than yesterday)       Pertinent labs including ab/vbg if available   BLOOD GAS  Lab Results   Component Value Date    PH 7.36 08/13/2021    PCO2 53 (H) 08/13/2021    PO2 53 (H) 08/13/2021    BICARBONATE 27.1 (H) 08/13/2021                Other pertinent imaging/studies   TTE    Results for orders placed during the hospital encounter of 08/12/21    TRANSTHORACIC ECHOCARDIOGRAM - ADULT 08/14/2021 11:27 AM    Narrative  **See full report in linked PDF document**  Transthoracic Echocardiographic Report    ______________________________________________________________________________  Name: Dalton, Akeylah                        MRN: [id number]               Weight: 194 lb  Study Date: 08/14/2021 08:53 AM              DOB: [birth year]             Height: 67 in  Gender: Female                               Age: 73 yrs                 BSA: 2.0 m2  Accession #: [id number]                    BP: 119/76 mmHg  Patient Location: [facility]  Ordering Provider: Earna Coder  TechLuvenia Redden    ______________________________________________________________________________  Procedure:  Transthoracic complete echo with contrast, 2D, spectral and tissue Doppler, color flow Doppler, M-mode.  Quality:  Technically difficult study due to limited acoustic windows.    Indications: Dyspnea    Conclusions:  Left Ventricle: Normal left ventricular size. Concentric hypertrophy. Severe left ventricular hypertrophy. The left ventricular ejection fraction by visual assessment is estimated to be 65%. Left  ventricular systolic function is normal. No segmental/regional wall motion abnormalities identified. Left ventricular diastolic function is indeterminate.  Right Ventricle: The right ventricle is not well visualized. Right  ventricular systolic function cannot be assessed. RV systolic pressure could not be determined due to incomplete tricuspid  regurgitation envelope.  Likely hypertensive heart disease. Less likely infiltrative. Correlate clinically.    Findings  Left Ventricle:   Normal left ventricular size. Concentric hypertrophy. Severe left ventricular hypertrophy. The left ventricular ejection fraction by visual assessment is estimated to be 65%. Left  ventricular systolic function is normal. No segmental/regional wall motion abnormalities identified. Left ventricular diastolic function is indeterminate.  Right Ventricle:   The right ventricle is not well visualized. Right ventricular systolic function cannot be assessed. RV systolic pressure could not be determined due to incomplete tricuspid  regurgitation envelope.  Left Atrium:   The left atrium is not well visualized.  Right Atrium:   The right atrium is not well visualized.  Mitral Valve:   There is mild mitral stenosis. Trace mitral regurgitation present.  Tricuspid Valve:   The tricuspid valve is normal. Trace tricuspid regurgitation present.  Aortic Valve:   The aortic valve is normal. Trileaflet aortic valve. No Aortic valve stenosis. Trace aortic regurgitation present.  Pulmonic Valve:   The pulmonic valve is normal. Trace pulmonic valve regurgitation present.  IVC/Hepatic Veins:   The inferior vena cava was not visualized.  Aorta:   The aortic root is of normal size. The ascending aorta is normal in size.  Pericardium/Pleural space:   There is a trivial pericardial effusion.    Electronically signed by: MD Dorothyann Peng on 08/14/2021 11:27 AM                08/15/2021 15:02   Earle Gell, PA-C       Thank you for involving hospital sleep medicine in this patient's care.  Please call ext [phone] for any questions.

## 2021-08-15 NOTE — Consults (Signed)
[facility]  Pain Management Consult Service   Follow Up Note    Date of Service:  08/15/21  Date of Admission:  08/12/2021    Patient: [name]  MRN: [id number]  Date of Birth:  August 08, 1975      ASSESSMENT/RECOMMENDATIONS:   - Dx:  Chronic back pain, ESRD      - recommend to continue Marinol  - please contact pain service if further assistance is needed        SUBJECTIVE:   Pt resting in bed this afternoon, reports she has noticed improved in back pain with addition of marinol. Pt denies any pain complaints this afternoon.       Current Medications:  acetaminophen (TYLENOL) tablet, 650 mg, Oral, Q4H PRN  albuterol (PROVENTIL) 2.5 mg / 3 mL (0.083%) neb solution, 2.5 mg, Nebulization, Q4H PRN  apixaban (ELIQUIS) tablet, 10 mg, Oral, 2x/day   Followed by  Derrill Memo ON 08/22/2021] apixaban (ELIQUIS) tablet, 5 mg, Oral, 2x/day  budesonide-formoterol (SYMBICORT) 160 mcg-4.5 mcg per inhalation oral inhaler - "Respiratory to administer", 2 Puff, Inhalation, 2x/day  cinacalcet (SENSIPAR) tablet, 30 mg, Oral, QPM  cloNIDine (CATAPRES-TTS) transdermal patch 0.3 mg, 0.3 mg, Transdermal, Q7 Days  clopidogrel (PLAVIX) 75 mg tablet, 75 mg, Oral, Daily  cyclobenzaprine (FLEXERIL) tablet, 5 mg, Oral, 2x/day PRN  D5W 250 mL flush bag, , Intravenous, Q15 Min PRN  dilTIAZem (CARDIZEM CD) 24 hr extended release capsule, 120 mg, Oral, Daily  doxazosin (CARDURA) tablet, 4 mg, Oral, QPM  dronabinol (MARINOL) capsule, 2.5 mg, Oral, 3x/day  hydrALAZINE (APRESOLINE) injection 10 mg, 10 mg, Intravenous, Q1H PRN  isosorbide mononitrate (IMDUR) 24 hr extended release tablet, 60 mg, Oral, QAM  labetalol (NORMODYNE) tablet, 200 mg, Oral, Q12H  lidocaine-menthol (LIDOPATCH) 3.6%-1.25% patch, 1 Patch, Transdermal, Daily  lidocaine-prilocaine (EMLA) 2.5%-2.5% topical cream, , Apply Topically, Once  magnesium hydroxide (MILK OF MAGNESIA) 400mg  per 74mL oral liquid, 10 mL, Oral, Now  montelukast (SINGULAIR) 10 mg tablet, 10 mg,  Oral, QPM  nicotine (NICODERM CQ) transdermal patch (mg/24 hr), 14 mg, Transdermal, Daily  NS 250 mL flush bag, , Intravenous, Q15 Min PRN  NS flush syringe, 2-6 mL, Intracatheter, Q8HRS  NS flush syringe, 2-6 mL, Intracatheter, Q1 MIN PRN  ondansetron (ZOFRAN) 2 mg/mL injection, 4 mg, Intravenous, Q8H PRN  oxyCODONE (ROXICODONE) immediate release tablet, 2.5 mg, Oral, Q4H PRN   Or  oxyCODONE (ROXICODONE) immediate release tablet, 5 mg, Oral, Q4H PRN  pantoprazole (PROTONIX) delayed release tablet, 40 mg, Oral, Daily  polyethylene glycol (MIRALAX) oral packet, 17 g, Oral, Daily  sennosides-docusate sodium (SENOKOT-S) 8.6-50mg  per tablet, 1 Tablet, Oral, 2x/day  sertraline (ZOLOFT) tablet, 25 mg, Oral, Daily   Followed by  Derrill Memo ON 08/17/2021] sertraline (ZOLOFT) tablet, 50 mg, Oral, Daily  sodium citrate 4% injection syringe, 2 Syringe, Intracatheter, Once  Vancomycin IV - Pharmacist to Dose per Protocol, , Does not apply, Daily PRN  Vancomycin IV Intermittent Dosing, , Does not apply, Daily PRN        Problem List:   Active Hospital Problems    Diagnosis    Primary Problem: SVC syndrome    Hypertensive urgency         PHYSICAL EXAMINATION:  BP (!) 162/109   Pulse 78   Temp 37 C (98.6 F)   Resp 13   Wt 88.2 kg (194 lb 7.1 oz)   SpO2 97%   BMI 30.45 kg/m      Body mass index is 30.45 kg/m.  GENERAL:The patient appears stated age, pleasant, appropriately groomed, in no acute distress.   PSYCHIATRIC: Alert & Oriented x 3. Judgment /insight normal, mood/affect normal.   EENT: non icteric, pupils-equal and round, MMM  RESPIRATORY: No accessory muscles, normal effort  CARDIOVASCULAR: RRR  SKIN: No rashes noted. Warm and dry.    ABDOMEN Soft, non-tender, non-distended.  MUSCULOSKELETAL/NEUROLOGICAL:  Gait-not observed      Labs Ordered/ Reviewed :   I have reviewed pertinent clinical lab tests including CMP, CBC, AST/ALT. Pertinent abnormal findings noted.     No results for input(s): ETHANOLSERUM in the last 72  hours.  No results for input(s): AMPHETUR, BARBUR, BENZOUR, COCMETUR, METHADONEUR, OPIATEURINE, PHENCYCUR, PROPOXYPHEUR, CANNABINUR in the last 72 hours.    Diagnostic/Radiology Tests Reviewed:  N/a    Kylie A Bias, APRN 08/15/2021, 14:24                The was visit conducted independently by the provider above.  The note was authored independently by the provider above.  Unless directly noted by me in the chart, I did not evaluate the patient or participate in formulating the plan of care.  Signature required by employer.    Chipper Oman, MD

## 2021-08-15 NOTE — Consults (Signed)
Dialysis Services follow-up:  Date of service: 08/15/2021  Hospital Day:  LOS: 3 days     Assessment:  46 year old female with PMH of ESRD (history of non-compliance), chronic CHF, chronic atypical chest pain, GERD, tobacco use,asthma, COPD and uncontrolled HTN. Presents as a transfer from [facility] where she presented w/ shortness of breath, wheezing  and diffuse volume overload after missing hemodialysis due to transportation issues. 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 Last HD was 6/19 at outside facility prior to transfer. Nephrology consulted for dialysis management.     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, etiology- hypertensive nephrosclerosis     Lytes: All within acceptable parameters  A/B: Bicarb 25, stable  CKD MB:Ca, mag at goal; phos 7.1  Sensipar 30 mg daily  Anemia: Hgb 7.2  Mircera 150 mcg q2wks (unknown last dose)  S/p 2 units PRBC on 6/20  ID  BCx: 6/18 GPC  UCx: none  Antibiotics: azithromycin (6/18), ceftriaxone (6/18)  Cardiology: no TTE/TEE  Volume status  Hypervolemic on exam with pulmonary edema and HTN  UO: not quantified  06/21 0700 - 06/22 0659  In: 606 [P.O.:415; I.V.:191]  Out: -       Labs  Recent Labs     08/12/21  1723 08/13/21  0024 08/14/21  0423 08/15/21  0605   SODIUM 136  128* 135* 135* 134*   POTASSIUM 4.9 4.8 4.5 5.1   CHLORIDE 93*  95* 93* 99 95*   CO2 28 27 26 25    BUN 43* 45* 27* 42*   CREATININE 10.92* 11.47* 6.65* 9.50*   GFR 4* 4* 7* 5*   ANIONGAP 15* 15* 10 14*     Recent Labs     08/12/21  1723 08/13/21  0024 08/14/21  0423 08/15/21  0605   CO2 28 27 26 25      Recent Labs     08/12/21  1723 08/13/21  0024 08/14/21  0423 08/15/21  0605   CALCIUM 9.6 9.4 9.4 9.4   ALBUMIN 3.6  --   --   --    MAGNESIUM 2.1  2.1 1.9 2.0   PHOSPHORUS 8.6* 9.1* 6.5* 7.1*     No results found for: Tharon Aquas  PTH   Date Value Ref Range Status   08/06/2021 249.5 (H) 12.0 - 88.0 pg/mL Final     Recent Labs     08/11/21  1330 08/11/21  2220 08/15/21  0605   WBC 13.4*   < > 10.7   HGB 6.0*   < > 7.2*   HCT 18.6*   < > 21.9*   PLTCNT 369   < > 243   PMNS 83*   < > 65   LYMPHOCYTES 8*  --   --    EOSINOPHIL 2  --   --     < > = values in this interval not displayed.     Lab Results   Component Value Date/Time    IRONBINDCAP 263 08/05/2021 06:03 PM    IRONSAT 13 (L) 08/05/2021 06:03 PM    FERRITIN 940 (H) 08/05/2021 06:03 PM     No results for input(s): FOLATE, VITB12  in the last 72 hours.  No results found for any visits on 08/12/21 (from the past 24 hour(s)).    Recs:   HD today with goal UF 3-4kg as tolerated. Suspect that SVC syndrome is likely r/t HD non-compliance with TDC not getting flushed regularly  Start Ferreclit 125 mg with dialysis given iron deficiency and anemia. May also need epo as inpatient.   Safe dose on dialysis for gabapentin is 100 mg post dialysis sessions    Subjective:  Patient expressing interest in AVF creation and owuld like to pursue this as she has had a lot of trouble with [facility]    Physical Exam:  BP (!) 149/96   Pulse 67   Temp 36 C (96.8 F)   Resp 14   Wt 88.2 kg (194 lb 7.1 oz)   SpO2 (!) 89%   BMI 30.45 kg/m       General: NAD , sleeping in bed during dialysis treatment   HEENT:NC/AT   LUNGS: no increased WOB, on RA  Cardiac: RRR  Dialysis access: R IJ TDC  Abdomen:Soft nontender, BS active  Musculoskeletal: No gross deformities  Neuro: Alert and Oriented, moves all 4 extremities  Skin: Skin is warm and dry      acetaminophen (TYLENOL) tablet, 650 mg, Oral, Q4H PRN  albuterol (PROVENTIL) 2.5 mg / 3 mL (0.083%) neb solution, 2.5 mg, Nebulization, Q4H PRN  albuterol sulfate (PROVENTIL) 2.5 mg /3 mL (0.083 %) nebulizer solution ---Cabinet Override, , ,   budesonide-formoterol  (SYMBICORT) 160 mcg-4.5 mcg per inhalation oral inhaler - "Respiratory to administer", 2 Puff, Inhalation, 2x/day  cinacalcet (SENSIPAR) tablet, 30 mg, Oral, QPM  cloNIDine (CATAPRES-TTS) transdermal patch 0.3 mg, 0.3 mg, Transdermal, Q7 Days  clopidogrel (PLAVIX) 75 mg tablet, 75 mg, Oral, Daily  cyclobenzaprine (FLEXERIL) tablet, 5 mg, Oral, 2x/day PRN  D5W 250 mL flush bag, , Intravenous, Q15 Min PRN  dilTIAZem (CARDIZEM CD) 24 hr extended release capsule, 120 mg, Oral, Daily  doxazosin (CARDURA) tablet, 4 mg, Oral, QPM  dronabinol (MARINOL) capsule, 2.5 mg, Oral, 3x/day  ferric gluconate (FERRLECIT) 125 mg in NS 100 mL IVPB, 125 mg, Intravenous, Give in Dialysis  heparin 25,000 units in 0.45% NS 250 mL infusion, 18 Units/kg/hr (Adjusted), Intravenous, Continuous  hydrALAZINE (APRESOLINE) injection 10 mg, 10 mg, Intravenous, Q1H PRN  isosorbide mononitrate (IMDUR) 24 hr extended release tablet, 60 mg, Oral, QAM  labetalol (NORMODYNE) tablet, 200 mg, Oral, Q12H  lidocaine-menthol (LIDOPATCH) 3.6%-1.25% patch, 1 Patch, Transdermal, Daily  lidocaine-prilocaine (EMLA) 2.5%-2.5% topical cream, , Apply Topically, Once  magnesium hydroxide (MILK OF MAGNESIA) 400mg  per 50mL oral liquid, 10 mL, Oral, Now  montelukast (SINGULAIR) 10 mg tablet, 10 mg, Oral, QPM  nicotine (NICODERM CQ) transdermal patch (mg/24 hr), 14 mg, Transdermal, Daily  NS 250 mL flush bag, , Intravenous, Q15 Min PRN  NS flush syringe, 2-6 mL, Intracatheter, Q8HRS  NS flush syringe, 2-6 mL, Intracatheter, Q1 MIN PRN  ondansetron (ZOFRAN) 2 mg/mL injection, 4 mg, Intravenous, Q8H PRN  oxyCODONE (ROXICODONE) immediate release tablet, 2.5 mg, Oral, Q4H PRN   Or  oxyCODONE (ROXICODONE) immediate release tablet, 5 mg, Oral, Q4H PRN  pantoprazole (PROTONIX) delayed release tablet, 40 mg, Oral, Daily  polyethylene glycol (MIRALAX) oral packet, 17 g, Oral, Daily  sennosides-docusate sodium (SENOKOT-S) 8.6-50mg  per tablet, 1 Tablet, Oral, 2x/day  sertraline  (ZOLOFT) tablet, 25 mg, Oral, Daily   Followed by  Derrill Memo ON 08/17/2021] sertraline (ZOLOFT) tablet, 50 mg, Oral, Daily  sodium citrate 4% injection  syringe, 2 Syringe, Intracatheter, Give in Dialysis  vancomycin (VANCOCIN) 1 g in D5W 200 mL premix IVPB, 15 mg/kg (Adjusted), Intravenous, Give in Dialysis  Vancomycin IV - Pharmacist to Dose per Protocol, , Does not apply, Daily PRN  Vancomycin IV Intermittent Dosing, , Does not apply, Daily PRN      Clifton Custard, MD - 08/15/2021  [facility]  Nephrology Fellow PGY-4      Attending attestation note:    I saw and examined the patient.  I reviewed Dr. Jacklynn Barnacle note.  I agree with the findings and plan of care as documented in the fellow's note. Any additions/changes were made accordingly. I saw her on dialysis today.      08/15/2021, 17:45    Jacqlyn Larsen, M.D., M.S., FASN  Assistant Professor of Medicine  Section of Nephrology

## 2021-08-15 NOTE — Consults (Signed)
[facility]  Psychiatry Consults- Subsequent Encounter    [name]  07-Mar-1975  [id number]    Date of service: 08/15/21    CHIEF COMPLAINT/REASON FOR CONSULT: Follow up  SI    ASSESSMENT:  [name] is a 46 y.o. female with a reported psychiatric history of depression and anxiety who transferred to [facility] from [facility] for SVC syndrome and hypertensive emergency after missing a HD session. Psychiatry was originally consulted for SI/depression. Patient has an extensive trauma history and endorses PTSD symptoms, including nightmares. Patient also has chronic passive SI at baseline in the setting of her illness and not having a good support system. Denies active SI, HI, or AVH. Reported nephrologist was giving her hydroxyzine (takes either 25 or 50 mg) at night for anxiety. Patient initially ambivalent but later was open to medications. At this time there is not indication for psychiatric hospitalization.   Initiated Zoloft for mood symptoms; reports worsening pain but otherwise tolerating Zoloft. Will continue plan for Zoloft due to best safety in HD/renal impairment and can consider SNRI (dose adjusted for renal impairment) in the future if needed.         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 Patient does not have guns in the home.  Protective Factors Include: no prior suicide attempts, no prior self-injury, demonstration of help-seeking behavior, religious/spiritual beliefs and no active thoughts of suicide.  Summary: At this time, the patient does not pose an acute risk above baseline. Protective factors include: see above        PLAN:     - Please place psychiatry consult  order if not already done.  - Precautions: LOW RISK precautions (contract for safety, no need for 1:1)  - Medications               - recommended continuing Zoloft 25 mg daily x3 days (started 6/21) for now then 50 mg daily if tolerating 25mg  (with ultimate target of 100 mg daily)                - patient already on Cardura 4mg  QPM (home med for HTN) which can also help with nightmares  - recommend repeat EKG to trend QTC (or continuous tele); QTC on 6/20 was 480  - will discuss cannabis effect on mood when able  - on NRT  - patient does not desire therapy or chaplain at this time  - Disposition: No indication for psychiatric hospitalization at this time. Will arrange OP follow up preferably close to home if possible. Will continue to follow. Informed primary team.         SUBJECTIVE:   Patient in dialysis this AM. Was very sleepy and reported she is "not feeling well." Endorsed HA, CP from dialysis site, and general fatigue/malaise but unable to specify further. Otherwise no SE from Zoloft, does not report new unsafe thoughts. Okay with continuing Zoloft for now, but is also open to SNRI like Cymbalta/Effexor in future if safe from renal standpoint to help with both pain/mood.       ROS: 12 point ROS completed.  Constitutional: Negative except for as documented in HPI  Eyes: Negative  Ears/nose/mouth/throat: Negative  Resp: Negative  Cardio: Negative except for as documented  in HPI  GI : Negative  GU : Negative  Skin/breast: Negative  Heme/lymph: Negative  Musculoskeletal: Negative except for as documented in HPI  Neuro: Negative except for as documented in HPI  Psychiatric: See HPI.  Endocrine: Negative  Allergy/immuno: Negative      Current Medications: Reviewed in Epic.  Allergies:   Allergies   Allergen Reactions    Lisinopril Swelling     Tongue and throat swelling         PHYSICAL EXAM:  Constitutional: BP (!) 149/96   Pulse 67   Temp 36 C (96.8 F)   Resp 14   Wt 88.2 kg (194 lb 7.1 oz)   SpO2 (!) 89%    BMI 30.45 kg/m       Head: Atraumatic, normocephalic   Eyes: Pupils equal, round. EOM grossly intact. No nystagmus. Conjunctiva clear.  Respiratory: No increased work of breathing. No use of accessory muscles.  Cardiovascular: No swelling/edema of exposed extremities. HD port visible and intact.   Musculoskeletal: No gross deformity   Neuro: No abnormal movements noted. No tremor.  Psych: As above in MSE  Skin: Dry. No diaphoresis or flushing.     Mental Status Exam:  Appearance: hospital gown and sleepy; in dialysis this AM  Behavior: calm and cooperative  Gait/Station:  reclined in bed  Musculoskeletal: No psychomotor agitation or retardation noted  Speech: quiet  Mood: "not feeling well"  Affect: congruent with mood  Thought Process: linear  Associations:  no loosening of associations  Thought Content:  no active thoughts of SI  Perceptual Disturbances:  does not appear to be responding to internal stimuli  Attention/Concentration: grossly intact  Orientation: grossly oriented  Memory: recent and remote memory intact per interview  Language: no word-finding issues  Insight: fair  Judgment: fair        PERTINENT STUDIES/TESTING:    UDS : not performed  EtOH: not performed  EKG:  QTC on 6/20 was 480  Pertinent imaging: CXR 6/20   IMPRESSION:  1.Interval replacement of the right IJ dialysis catheter and stenting of the SVC.  2.Worsening lung aeration with increased right mid and lower lung zone patchy consolidations concerning for infectious process.    Lind Covert Gwendolyn Grant MD6/22/2023 08:41  PGY-1 Psychiatry      I was present and participated in the development of the treatment plan. I saw and examined the patient on DOS.  I reviewed the resident's note.  I agree with the findings and plan of care as documented in the resident's note.  Any exceptions/additions are edited/noted.    Sheila Oats, MD 08/15/2021

## 2021-08-15 NOTE — Nurses Notes (Signed)
Hemodialysis completed without incident. Medications administered per MAR, VSS, catheter performed as expected. Report given to floor RN. 3.8 L UF.

## 2021-08-15 NOTE — Nurses Notes (Signed)
Received pt to room 1 from MICU; oriented to room and surroundings; call bell within reach

## 2021-08-15 NOTE — Consults (Signed)
PATIENT NAME: [name], [name] NUMBER:  [id number]  DATE OF SERVICE: 08/15/2021  DATE OF BIRTH:  04-15-1975    CONSULTATION    INFECTIOUS DISEASE INITIAL CONSULTATION NOTE.    LENGTH OF HOSPITAL STAY:  3 days.    SERVICE:  Med hospitalist 4.    REQUESTING PHYSICIAN:  Derenda Mis, MD.    IMPRESSION:  [name] is a 46 year old female with a history of hypertension, GERD, end-stage renal disease on iHD MWF, coronary artery disease, and obstructive sleep apnea.  She is admitted to [facility] as of August 12, 2021, by transfer from 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  Suspect an infected TCC with suppurative thrombophlebitis. TCC was exchanged on August 13, 2021, with aspiration thrombectomy and venous stent placement. Repeat blood cultures August 15, 2021 are pending.     1. Continue to follow pending blood cultures.  We have asked the microbiology department to perform susceptibility testing.  2. Agree with initiation of vancomycin dose per pharmacy per protocol.  Adjusted with dialysis on dialysis days.  3. Recommend a 6-week course of vancomycin from date of clearance of bacteremia.   4. Consult OPAT.   5. Continue to follow CBC, differential, BMP, LFT panel, CRP.    Thank you for this consultation.  We will sign off.  Please call with any further questions.    Information was obtained from patient and per EMR.    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  The patient has been dialysis dependent since 2010.   She was initially receiving dialysis through left upper extremity AV fistula, but this malfunction requiring right chest TCC around 2021.  The patient has had multiple hospitalizations secondary to SVC syndrome.  She was initially hospitalized at [facility] from December 04, 2020, through December 13, 2020, due to SVC syndrome, status post balloon angioplasty of right innominate vein on December 05, 2020.  No blood cultures were obtained at that time, no suspicion for infection.  The patient was most recently admitted to [facility] from August 05, 2021, until August 06, 2021, secondary to cough, myalgias, and chills.  At the outside facility, she was found to have coagulase negative staphylococci bacteremia on August 05, 2021, which was presumed contaminant.  Chest x-ray was positive for vascular congestion with concern for pneumonia.  The patient was discharged to complete a course of Augmentin.     However, she re-presented to [facility] on August 11, 2021, secondary to shortness of breath and swelling of the face, head, neck and arms.  CTA was obtained at the outside facility revealing near-total occlusion of the distal SVC associated with a thrombus at the distal tip of the catheter.  Blood cultures were obtained at the outside facility and found positive in 1 out of 2 sets for coagulase-negative staphylococci.  The patient was initially started on azithromycin and ceftriaxone with concern for pneumonia, which were subsequently stopped with increasing suspicion for SVC syndrome.  She was ultimately transferred to [facility] on August 12, 2021, for higher level of care.  She was admitted to our  medical ICU secondary to hypertensive urgency, and SVC syndrome.  Interventional Radiology performed aspiration thrombectomy, TCC exchange, and [place] stent placement on August 13, 2021.  Blood cultures obtained on August 13, 2021, are persistently positive for GPCs in clusters.  Staph  aureus PCR is negative.  With increasing suspicion for infected thrombus, the patient was started on vancomycin on August 15, 2021.  Transthoracic echocardiogram is without valvular vegetation and preserved EF of 65%.  The patient is currently resting in bed.  She denies any new focal joint pain.  Denies prosthetic joints, pacemaker or prosthetic heart valve.  She reports all PermCaths were placed to the right chest.  She does report malfunction in the outpatient dialysis center, but denies associated fevers or chills especially during dialysis sessions.  She is overall frustrated with hospitalization.  Reports that she has been on antibiotics for "months" and that they "do nothing for me."  Whenever I asked what the patient is taking the antibiotics for she reports, "I don't know. Blood infection"  She does recall taking oral medications as opposed to IV infusions with dialysis.  Upon calling her pharmacy, [facility], it appears she had a prescription of azithromycin filled on April 18, May 5, and then a subsequent prescription of Augmentin filled on August 02, 2021.      PAST MEDICAL HISTORY:  1. Hypertension.  2. GERD.  3. Hypertensive nephropathy.  4. Intermittent hemodialysis Monday, Wednesday, Friday.  5. Coronary artery disease.  6. Obstructive sleep apnea.    PAST SURGICAL HISTORY:  1. Tonsillectomy.  2. Hysterectomy.  3. Right foot surgery.  4. Cholecystectomy.  5. Spine surgery.  6. Left upper extremity AV fistula.    FAMILY HISTORY:  Family Medical History:       Problem Relation (Age of Onset)    Breast Cancer Mother    Coronary Artery Disease Mother    Diabetes type II Mother    Hypertension (High Blood Pressure) Father          ALLERGIES:  No known antimicrobial allergies.    LISINOPRIL causes tongue swelling.    MEDICATIONS:  Current Facility-Administered Medications   Medication Dose Route Frequency    acetaminophen (TYLENOL) tablet  650 mg Oral Q4H PRN    albuterol (PROVENTIL) 2.5 mg / 3  mL (0.083%) neb solution  2.5 mg Nebulization Q4H PRN    apixaban (ELIQUIS) tablet  10 mg Oral 2x/day    Followed by    Derrill Memo ON 08/22/2021] apixaban (ELIQUIS) tablet  5 mg Oral 2x/day    budesonide-formoterol (SYMBICORT) 160 mcg-4.5 mcg per inhalation oral inhaler - "Respiratory to administer"  2 Puff Inhalation 2x/day    cinacalcet (SENSIPAR) tablet  30 mg Oral QPM    cloNIDine (CATAPRES-TTS) transdermal patch 0.3 mg  0.3 mg Transdermal Q7 Days    clopidogrel (PLAVIX) 75 mg tablet  75 mg Oral Daily    cyclobenzaprine (FLEXERIL) tablet  5 mg Oral 2x/day PRN    D5W 250 mL flush bag   Intravenous Q15 Min PRN    dilTIAZem (CARDIZEM CD) 24 hr extended release capsule  120 mg Oral Daily    doxazosin (CARDURA) tablet  4 mg Oral QPM    dronabinol (MARINOL) capsule  2.5 mg Oral 3x/day    hydrALAZINE (APRESOLINE) injection 10 mg  10 mg Intravenous Q1H PRN    isosorbide mononitrate (IMDUR) 24 hr extended release tablet  60 mg Oral QAM    labetalol (NORMODYNE) tablet  200 mg Oral Q12H  lidocaine-menthol (LIDOPATCH) 3.6%-1.25% patch  1 Patch Transdermal Daily    montelukast (SINGULAIR) 10 mg tablet  10 mg Oral QPM    nicotine (NICODERM CQ) transdermal patch (mg/24 hr)  14 mg Transdermal Daily    NS 250 mL flush bag   Intravenous Q15 Min PRN    NS flush syringe  2-6 mL Intracatheter Q8HRS    NS flush syringe  2-6 mL Intracatheter Q1 MIN PRN    ondansetron (ZOFRAN) 2 mg/mL injection  4 mg Intravenous Q8H PRN    oxyCODONE (ROXICODONE) immediate release tablet  2.5 mg Oral Q4H PRN    Or    oxyCODONE (ROXICODONE) immediate release tablet  5 mg Oral Q4H PRN    pantoprazole (PROTONIX) delayed release tablet  40 mg Oral Daily    polyethylene glycol (MIRALAX) oral packet  17 g Oral Daily    sennosides-docusate sodium (SENOKOT-S) 8.6-50mg  per tablet  1 Tablet Oral 2x/day    sertraline (ZOLOFT) tablet  25 mg Oral Daily    Followed by    Derrill Memo ON 08/17/2021] sertraline (ZOLOFT) tablet  50 mg Oral Daily    Vancomycin IV - Pharmacist to  Dose per Protocol   Does not apply Daily PRN    Vancomycin IV Intermittent Dosing   Does not apply Daily PRN     ANTIBIOTICS:  1. Clindamycin filled February 14, 2021.  2. Penicillin filled March 13, 2021.  3. Azithromycin filled June 11, 2021.  4. Azithromycin filled Jun 28, 2021.  5. Augmentin filled August 02, 2021.  6. Augmentin filled August 06, 2021.  7. Azithromycin and ceftriaxone August 11, 2021.  8. Vancomycin x1 August 13, 2021.  9. Vancomycin August 15, 2021, through present.    SOCIAL HISTORY:  The patient lives in [place], [place], alone.  She moved back to [place] from [place] approximately 4 years ago.  She smokes approximately a quarter of a pack of cigarettes a day.  Additionally, smokes marijuana.  Denies intravenous drug use history.  No alcohol use.    REVIEW OF SYSTEMS:  Constitutional:  The patient denies fevers, chills.  Eyes:  Denies visual changes.  ENT:  Denies sore throat or sinus congestion.  Respiratory:  Does report worsening shortness of breath prior to hospitalization which has overall been improving.  Cardiovascular:  Denies chest pain.  Does report swelling of the face, chest, and upper extremities which has overall improved.  Gastrointestinal:  Denies abdominal pain, nausea, vomiting.  Does report constipation.  Musculoskeletal:  Denies myalgias or arthralgias.  Denies prosthetic joints.  Integumentary:  Denies cutaneous rashes.  Does report some malfunction of her TCC on an outpatient basis.  Neurologic:  Denies headache.  Endocrine:  Does have a history of diabetes mellitus.    PHYSICAL EXAMINATION:  Filed Vitals:    08/15/21 1200 08/15/21 1300 08/15/21 1400 08/15/21 1500   BP:  (!) 162/109 134/84 118/78   Pulse:  78 68 71   Resp:  13 13 17    Temp: 37 C (98.6 F)      SpO2:  97% 100% 95%     Constitutional:  This is a chronically ill-appearing female in no acute distress.  Neurologic:  The patient is alert and oriented to person, place, and time.  Eyes:  Sclerae  nonicteric.  Conjunctivae not injected.  Psychiatric:  Affect is flat.  HEENT:  Buccal mucosa is moist.  Healthy dentition present.  Respiratory:  Currently on room air without respiratory distress.  Full breath sounds  bilaterally.  No crackles or wheeze.  Cardiovascular:  Heart has a regular rate and rhythm without appreciable murmur.  Radial and posterior tibialis pulses are 2+ bilaterally.  Gastrointestinal:  Abdomen is soft, nondistended, nontender to palpation.  Normoactive bowel sounds present.  Musculoskeletal:  Spine nontender to palpation.  Joints without erythema, edema, or warmth.  Moves upper and lower extremities spontaneously. No tenderness to palpation of the bilateral sternoclavicular, acromioclavicular, glenohumeral joints.  Integumentary:  Skin is warm and nondiaphoretic.  TCC to the right chest.  Does have surrounding induration without warmth.    LABORATORY DATA:  Results for orders placed or performed during the hospital encounter of 08/12/21 (from the past 24 hour(s))   PTT (PARTIAL THROMBOPLASTIN TIME) - ONCE   Result Value Ref Range    APTT 118.9 (H) 24.2 - 37.5 seconds    Narrative    Therapeutic range for unfractionated heparin is 60-100 seconds.   PTT (PARTIAL THROMBOPLASTIN TIME) - ONCE   Result Value Ref Range    APTT 61.3 (H) 24.2 - 37.5 seconds    Narrative    Therapeutic range for unfractionated heparin is 60-100 seconds.   BASIC METABOLIC PANEL   Result Value Ref Range    SODIUM 134 (L) 136 - 145 mmol/L    POTASSIUM 5.1 3.5 - 5.1 mmol/L    CHLORIDE 95 (L) 96 - 111 mmol/L    CO2 TOTAL 25 22 - 30 mmol/L    ANION GAP 14 (H) 4 - 13 mmol/L    CALCIUM 9.4 8.5 - 10.0 mg/dL    GLUCOSE 79 65 - 125 mg/dL    BUN 42 (H) 8 - 25 mg/dL    CREATININE 9.50 (H) 0.60 - 1.05 mg/dL    BUN/CREA RATIO 4 (L) 6 - 22    ESTIMATED GFR 5 (L) >=60 mL/min/BSA   CBC/DIFF    Narrative    The following orders were created for panel order CBC/DIFF.  Procedure                               Abnormality         Status                      ---------                               -----------         ------                     CBC WITH CHEN[[id number]]                Abnormal            Final result                 Please view results for these tests on the individual orders.   MAGNESIUM   Result Value Ref Range    MAGNESIUM 2.0 1.8 - 2.6 mg/dL   PHOSPHORUS   Result Value Ref Range    PHOSPHORUS 7.1 (H) 2.4 - 4.7 mg/dL   CBC WITH DIFF   Result Value Ref Range    WBC 10.7 3.7 - 11.0 x10^3/uL    RBC 2.38 (L) 3.85 - 5.22 x10^6/uL    HGB 7.2 (L) 11.5 - 16.0 g/dL    HCT 21.9 (L) 34.8 -  46.0 %    MCV 92.0 78.0 - 100.0 fL    MCH 30.3 26.0 - 32.0 pg    MCHC 32.9 31.0 - 35.5 g/dL    RDW-CV 17.9 (H) 11.5 - 15.5 %    PLATELETS 243 150 - 400 x10^3/uL    MPV 9.3 8.7 - 12.5 fL    NEUTROPHIL % 65 %    LYMPHOCYTE % 21 %    MONOCYTE % 10 %    EOSINOPHIL % 2 %    BASOPHIL % 1 %    NEUTROPHIL # 7.02 1.50 - 7.70 x10^3/uL    LYMPHOCYTE # 2.24 1.00 - 4.80 x10^3/uL    MONOCYTE # 1.02 0.20 - 1.10 x10^3/uL    EOSINOPHIL # 0.26 <=0.50 x10^3/uL    BASOPHIL # <0.10 <=0.20 x10^3/uL    IMMATURE GRANULOCYTE % 1 0 - 1 %    IMMATURE GRANULOCYTE # 0.10 (H) <0.10 x10^3/uL   PTT (PARTIAL THROMBOPLASTIN TIME) - ONCE   Result Value Ref Range    APTT 86.2 (H) 24.2 - 37.5 seconds    Narrative    Therapeutic range for unfractionated heparin is 60-100 seconds.     MICROBIOLOGY DATA:  1. Blood cultures x2 sets obtained August 05, 2021, are positive in 1 out of 2 sets for coagulase-negative staphylococci.  2. Blood cultures x2 sets obtained August 11, 2021, are positive 1 out of 2 sets for coagulase-negative staphylococci.  3. Blood cultures x2 sets obtained August 13, 2021, are positive in 1 set for GPCs in clusters with Staphylococcus aureus PCR negative.  4. Blood cultures x2 sets, 1 obtained from TCC and 1 from venipuncture are pending.    IMAGING:  Results for orders placed or performed during the hospital encounter of 08/12/21 (from the past 72 hour(s))   XR AP MOBILE CHEST      Status: None    Narrative    Monteen Rickel    Female, 46 years old.    XR AP MOBILE CHEST performed on 08/12/2021 5:43 PM.    REASON FOR EXAM:  pulm edema    TECHNIQUE: 1 views/1 images submitted for interpretation.    COMPARISON:  Chest radiograph 08/11/2021.    FINDINGS:  The lungs are adequately inflated. The cardiomediastinal silhouette is at the upper limits of normal for size but stable from prior. There is mild interstitial prominence noted throughout the perihilar and infrahilar regions bilaterally overall improved when compared to one day prior. Findings likely represent a improving pulmonary edema pattern. No evidence of focal consolidation, effusion or pneumothorax. Ossifications are seen within the aortic arch. A vascular stent projects over the left axillary region. Unchanged positioning of a right IJ approach central venous catheter with tip projecting over the mid SVC.      Impression    1.Persistent but mildly improved pulmonary edema pattern without underlying effusion or new cardiopulmonary abnormality.  2.Right IJ approach central venous catheter tip projects over the mid SVC.   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      Impression    1.Initial venography and intravenous ultrasound (IVUS) revealed moderate stenosis  of the SVC related to large pericatheter thrombus.  2.Mechanical/rheolytic thrombectomy and venoplasty of the SVC. Post-intervention venography and IVUS demonstrated persistent SVC thrombus.  3.Stenting of the SVC with self-expandable uncovered stent (18 x 60 mm  Wallstent)  4.Final venography and IVUS demonstrates good flow through the SVC and normal luminal diameter with patent stent.  5.Exchange of tunneled hemodialysis catheter with placement of a new 14.5 F x 23 cm Palindrome tunneled HD catheter.    Plan:    1.Continue therapeutic anticoagulation with heparin drip, which can be transitioned to outpatient regimen when appropriate. Patient should be on anticoagulation + Plavix for 3-6 months.  2.Hemoglobinuria is expected post-rheolytic thrombectomy. This is not hematuria. Urine should clear within a few days post-procedure.  3.Hemodialysis catheter is ok for immediate use.  _______________________________________________________________    PROCEDURE SUMMARY:  - Radial artery access  - Selective venography as described below  - Tunneled central venous catheter exchange with fluoroscopic guidance  - Mechanical or aspiration thrombectomy  - Venoplasty  - Venous stenting  - Intravascular ultrasound    Complexity: This procedure involved substantially greater physician work, time and complexity than normal.  Additional time required due to complexity: 60 minutes  Reason for additional time required: Complex thrombectomy requiring multiple techniques of clot removal  Procedure start time: 09:58  Procedure end time: 13:17  Total operative time: 199 minutes      PROCEDURE DETAILS:    Pre-procedure  Consent: Informed consent for the procedure including risks, benefits and alternatives was obtained and time-out was performed prior to the procedure.  Preparation: The site was prepared and draped using maximal sterile barrier technique including cutaneous antisepsis.      Anesthesia/sedation  Level of anesthesia/sedation: General anesthesia  Anesthesia/sedation administered by: Anesthesiology      Access  Initial venography was performed through the indwelling hemodialysis catheter, demonstrating catheter tip at the cavoatrial junction with partially imaged clot adherent to the  catheter tip.     A 0.035 inch wire was placed through the indwelling tunneled right internal jugular central venous hemodialysis catheter and directed into the IVC. The catheter was removed over wire with blunt dissection of the cuff from the tract and traction. A 12 French x 45 sheath was placed through the right internal jugular tunneled tract.       Venography  The veins were catheterized using 5 [place] multipurpose Glide catheter, 0.035 inch Amplatz wire, 0.035 inch Glide advantage wire, 0.035 inch stiff Glide wire.  Indication for venography: Diagnostic angiography - A prior study is available, but the patient's condition with respect to the clinical indication has changed since the prior study.    Vein catheterized: Superior vena cava  Findings: Moderate stenosis of the SVC with partially occlusive intraluminal clot      Intravascular ultrasound  Vessel imaged: Superior vena cava  Indication for IVUS: Assess extent of intraluminal clot, accurate measurement of vessel diameter  Findings: Partially occlusive thrombus within the SVC      Rheolytic thrombectomy  Venous segment treated: Superior vena cava  Thrombectomy device: Angiojet, 8 French (total of 100 seconds of thrombectomy was performed)  Post-thrombectomy venography: Slightly improved intraluminal clot burden. Persistent partially occlusive intraluminal clot      Intravascular ultrasound  Vessel imaged: Superior vena cava  Indication for IVUS: Assess extent of intraluminal clot, accurate measurement of vessel diameter  Findings: Persistent partially occlusive thrombus within the SVC      Venoplasty  Venoplasty location: Superior vena cava  Venoplasty balloon:  Mustang 51mm and 52mm high-pressure balloons  Post-intervention venography: Persistent partially occlusive SVC thrombus      Mechanical thrombectomy  Venous segment treated: Superior vena cava  Thrombectomy device: Inari FlowTriever embolectomy discs  Post-thrombectomy venography: Persistent  partially occlusive SVC thrombus      Venous stent placement  Venous stent location: Superior vena cava  Venous stent: Wallstent (18 mm x 60 mm)  Post-stenting venoplasty: Atlas 18 mm balloon  Post-stenting venography: Improved luminal diameter of the SVC without any visible intraluminal clot      Intravascular ultrasound  Vessel imaged: Superior vena cava  Indication for IVUS: Assess patency of newly-placed stent  Findings: Patent stent with normal luminal diameter. No evidence of intraluminal thrombus      Catheter exchange  The 12 French sheath was removed over the wire and a new hemodialysis catheter was advanced over wire under fluoroscopic guidance. Catheter tip location was fluoroscopically verified and a permanent image was stored.   Catheter placed: Palindrome, 23 cm  Catheter size Leeroy Bock): 14.5  Catheter flush: Normal saline      Closure  The catheter was secured. A sterile dressing was applied.  Catheter securement technique: Non-absorbable suture    Contrast  Contrast agent: Isovue 300  Contrast volume (mL): 200    Radiation Dose  Fluoroscopy time (minutes): 30.5    Reference air kerma (mGy): 479.600     Additional Details  Additional description of procedure: None  Equipment details: None  Specimens removed: None  Estimated blood loss (mL): Less than 10  Standardized report: [email or web address]    Attestation  Signer name: Limmie Patricia, MD  I attest that I was present for the entire procedure. I reviewed the stored images and agree with the report as written.   XR AP MOBILE CHEST     Status: None    Narrative    XR AP MOBILE CHEST performed on 08/13/2021 2:28 PM    INDICATION: 46 years old Female; TCC placement    TECHNIQUE: 1 views of the chest; 1 images    COMPARISON: Chest radiograph performed on 08/12/2021    FINDINGS:    Support devices:  Interval replacement of the right IJ dialysis catheter, with the tip terminating at the cavoatrial junction. Additionally there has  been interval stenting of the SVC.    There is silhouetting of the right heart border secondary to increased patchy opacities in the right mid to lower lung zones. Suspected small left pleural effusion. No visualized pneumothorax.      Impression    1.Interval replacement of the right IJ dialysis catheter and stenting of the SVC.  2.Worsening lung aeration with increased right mid and lower lung zone patchy consolidations concerning for infectious process.         I independently of the faculty provider spent a total of 45 minutes in direct/indirect care of this patient including initial evaluation, review of laboratory, radiology, diagnostic studies, review of medical record, order entry and coordination of care.      Monica Martinez, PA-C        I saw and examined the patient.  I reviewed the ID PA-C's note.  I agree with the findings and plan of care as documented in the ID PA-C's note.  Any exceptions/additions are edited/noted.      I personally saw and evaluated the patient as part of a shared service with an APP.    My substantive findings are:  Patient w/ infectious complication related to dialysis access (TCC).  Given the presence of clot as well, suggest treat for prolonged time period as endovascular infection.  Vanco can be given with Dialysis.  Educated patient on need to ensure no relapse after off Abx given the same site replacement (suggested blood culture 2 weeks after off IV Vanco)    I independently of the APP spent a total of (25) minutes in direct/indirect care of this patient including initial evaluation, review of laboratory, radiology, diagnostic studies, review of medical record, order entry and coordination of care.     Laurian Brim, MD         DD:  08/15/2021 15:33:00  DT:  08/15/2021 16:21:48 FP  D#:  [id number]

## 2021-08-15 NOTE — Pharmacy (Signed)
[facility] / [facility] of Pharmaceutical Services  Therapeutic Drug Monitoring: Vancomycin  08/15/2021      Patient name: Dawn, Malone  Date of Birth:  18-Nov-1975    Actual Weight:  Weight: 88.2 kg (194 lb 7.1 oz) (08/14/21 0500)     BMI:         Date RPh Current regimen (including mg/kg) Indication &  Organism AUC or trough based dosing Target Levels^ SCr (mg/dL) CrCl* (mL/min) Infectious Laboratory Markers (as applicable)   Measured level(s)   (mcg/mL) Calculated AUC (if AUC based monitoring) Plan & predicted AUC/trough if initial dosing (including when levels are due) Comments   6/20 NLKS Vancomycin to start Empiric, GPC on blood culture 6/18 trough  11.5 ESRD WBC:  Procal:  CRP:  N/A - Load vancomycin 20 mg/kg post iHD today   - No further vancomycin doses until after repeat iHD session  - f/u blood culture from 6/18 and repeat from 6/20   6/22 PT Intermittent    S/p Vanc 1500mg  6/20 @ 1720 Empiric- Bacteremia Intermittent  Trough 20-25   (pre-HD) 9.5  (HD) 9.4  (HD)    - Pt getting HD today, give Vanc 1000 mg (15 mg/kg) x 1 dose in HD. Obtain pre-HD lvl prior to 3rd HD session (next session).  - update: 6/20 blood cultures: GPCs (confirmed)                                                                                    ^Target levels depends on dosing and monitoring method, AUC vs. trough based. For AUC based dosing units are mg*h/L. For trough based dosing units are mcg/mL.     *Creatinine clearance is estimated by using the Cockcroft-Gault equation for adult patients and the Carol Ada for pediatric patients.    The decision to discontinue vancomycin therapy will be determined by the primary service.  Please contact the pharmacist with any questions regarding this patient's medication regimen.

## 2021-08-15 NOTE — Care Plan (Signed)
08/15/21 2257   Therapist Pager   OT Assigned/ Pager # Ozie Lupe [phone]   Rehab Session   Document Type rehab contact note   Total OT Minutes: 0   Daily Activity AM-PAC/6-clicks Score   Patient Mobility Barrier Patient off floor for procedure/test           Kathryne Hitch, MOT, OTR/L  Occupational Therapist  Pager [phone]

## 2021-08-15 NOTE — Progress Notes (Signed)
[facility]  Medicine Progress Note  Full Code   [name]  Date of service: 08/15/2021  Date of Admission:  08/12/2021    Hospital Day:  LOS: 3 days      Subjective: Patient seen during dialysis. She appears lethargic. When asked how she feels she said she "feels like i got hit by a train". She otherwise has no specific complaints.     Vital Signs:  Temp  Avg: 36.5 C (97.7 F)  Min: 36 C (96.8 F)  Max: 36.8 C (98.2 F)    Pulse  Avg: 77.2  Min: 60  Max: 89 BP  Min: 104/79  Max: 182/106   Resp  Avg: 17.5  Min: 9  Max: 31 SpO2  Avg: 96.2 %  Min: 71 %  Max: 100 %          Input/Output    Intake/Output Summary (Last 24 hours) at 08/15/2021 1215  Last data filed at 08/15/2021 0800  Gross per 24 hour   Intake 328.8 ml   Output --   Net 328.8 ml    I/O last shift:  06/22 0700 - 06/22 1859  In: 14.4 [I.V.:14.4]  Out: -    acetaminophen (TYLENOL) tablet, 650 mg, Oral, Q4H PRN  albuterol (PROVENTIL) 2.5 mg / 3 mL (0.083%) neb solution, 2.5 mg, Nebulization, Q4H PRN  albuterol sulfate (PROVENTIL) 2.5 mg /3 mL (0.083 %) nebulizer solution ---Cabinet Override, , ,   budesonide-formoterol (SYMBICORT) 160 mcg-4.5 mcg per inhalation oral inhaler - "Respiratory to administer", 2 Puff, Inhalation, 2x/day  cinacalcet (SENSIPAR) tablet, 30 mg, Oral, QPM  cloNIDine (CATAPRES-TTS) transdermal patch 0.3 mg, 0.3 mg, Transdermal, Q7 Days  clopidogrel (PLAVIX) 75 mg tablet, 75 mg, Oral, Daily  cyclobenzaprine (FLEXERIL) tablet, 5 mg, Oral, 2x/day PRN  D5W 250 mL flush bag, , Intravenous, Q15 Min PRN  dilTIAZem (CARDIZEM CD) 24 hr extended release capsule, 120 mg, Oral, Daily  doxazosin (CARDURA) tablet, 4 mg, Oral, QPM  dronabinol (MARINOL) capsule, 2.5 mg, Oral, 3x/day  heparin 25,000 units in 0.45% NS 250 mL infusion, 18 Units/kg/hr (Adjusted), Intravenous, Continuous  hydrALAZINE (APRESOLINE) injection 10 mg, 10 mg, Intravenous, Q1H PRN  isosorbide mononitrate (IMDUR) 24 hr extended release tablet, 60 mg, Oral,  QAM  labetalol (NORMODYNE) tablet, 200 mg, Oral, Q12H  lidocaine-menthol (LIDOPATCH) 3.6%-1.25% patch, 1 Patch, Transdermal, Daily  lidocaine-prilocaine (EMLA) 2.5%-2.5% topical cream, , Apply Topically, Once  magnesium hydroxide (MILK OF MAGNESIA) 400mg  per 57mL oral liquid, 10 mL, Oral, Now  montelukast (SINGULAIR) 10 mg tablet, 10 mg, Oral, QPM  nicotine (NICODERM CQ) transdermal patch (mg/24 hr), 14 mg, Transdermal, Daily  NS 250 mL flush bag, , Intravenous, Q15 Min PRN  NS flush syringe, 2-6 mL, Intracatheter, Q8HRS  NS flush syringe, 2-6 mL, Intracatheter, Q1 MIN PRN  ondansetron (ZOFRAN) 2 mg/mL injection, 4 mg, Intravenous, Q8H PRN  oxyCODONE (ROXICODONE) immediate release tablet, 2.5 mg, Oral, Q4H PRN   Or  oxyCODONE (ROXICODONE) immediate release tablet, 5 mg, Oral, Q4H PRN  pantoprazole (PROTONIX) delayed release tablet, 40 mg, Oral, Daily  polyethylene glycol (MIRALAX) oral packet, 17 g, Oral, Daily  sennosides-docusate sodium (SENOKOT-S) 8.6-50mg  per tablet, 1 Tablet, Oral, 2x/day  sertraline (ZOLOFT) tablet, 25 mg, Oral, Daily   Followed by  Derrill Memo ON 08/17/2021] sertraline (ZOLOFT) tablet, 50 mg, Oral, Daily  sodium citrate 4% injection syringe, 2 Syringe, Intracatheter, Give in Dialysis  Vancomycin IV - Pharmacist to Dose per Protocol, , Does not apply, Daily PRN  Vancomycin IV  Intermittent Dosing, , Does not apply, Daily PRN        Physical Exam:  Constitutional:  appears chronically ill, appears stated age and no distress  Eyes:  Conjunctiva clear., Pupils equal and round, reactive to light and accomodation. , Sclera non-icteric.   ENT:  Mouth mucous membranes moist. , Pharynx without injection or exudate.   Neck:  supple, symmetrical, trachea midline  Respiratory:  clear but diminished  Cardiovascular:  regular rate and rhythm, S1, S2 normal, no murmur, click, rub or gallop  Gastrointestinal:  Soft, non-tender, Bowel sounds normal, Non-tender, non-distended  Musculoskeletal:  Head atraumatic and  normocephalic  Integumentary:  Skin warm and dry  Neurologic:  Alert and oriented x3, No tremor, No asterixis  Psychiatric:  Affect Normal, Speech normal      Labs: I have reviewed all labs  CBC Results Differential Results   Recent Results (from the past 30 hour(s))   CBC WITH DIFF    Collection Time: 08/15/21  6:05 AM   Result Value    WBC 10.7    HGB 7.2 (L)    HCT 21.9 (L)    PLATELETS 243    Recent Results (from the past 30 hour(s))   CBC WITH DIFF    Collection Time: 08/15/21  6:05 AM   Result Value    WBC 10.7    NEUTROPHIL % 65    MONOCYTE % 10    BASOPHIL % 1    BASOPHIL # <0.10      BMP Results Other Chemistries Results   Results for orders placed or performed during the hospital encounter of 08/12/21 (from the past 30 hour(s))   BASIC METABOLIC PANEL    Collection Time: 08/15/21  6:05 AM   Result Value    SODIUM 134 (L)    POTASSIUM 5.1    CHLORIDE 95 (L)    CO2 TOTAL 25    GLUCOSE 79    BUN 42 (H)    CREATININE 9.50 (H)    Recent Results (from the past 30 hour(s))   PHOSPHORUS    Collection Time: 08/15/21  6:05 AM   Result Value    PHOSPHORUS 7.1 (H)   MAGNESIUM    Collection Time: 08/15/21  6:05 AM   Result Value    MAGNESIUM 2.0      Liver/Pancreas Enzyme Results Liver Function Results   No results found for this or any previous visit (from the past 30 hour(s)). No results found for this or any previous visit (from the past 30 hour(s)).   Cardiac Results Coags Results   No results found for this or any previous visit (from the past 30 hour(s)). Recent Results (from the past 30 hour(s))   PTT (PARTIAL THROMBOPLASTIN TIME) - ONCE    Collection Time: 08/15/21  6:05 AM   Result Value    APTT 86.2 (H)        Radiology: I have reviewed all imaging  Results for orders placed or performed during the hospital encounter of 08/12/21 (from the past 72 hour(s))   XR AP MOBILE CHEST     Status: None    Narrative    [name]    Female, 46 years old.    XR AP MOBILE CHEST performed on 08/12/2021 5:43  PM.    REASON FOR EXAM:  pulm edema    TECHNIQUE: 1 views/1 images submitted for interpretation.    COMPARISON:  Chest radiograph 08/11/2021.    FINDINGS:  The lungs are adequately  inflated. The cardiomediastinal silhouette is at the upper limits of normal for size but stable from prior. There is mild interstitial prominence noted throughout the perihilar and infrahilar regions bilaterally overall improved when compared to one day prior. Findings likely represent a improving pulmonary edema pattern. No evidence of focal consolidation, effusion or pneumothorax. Ossifications are seen within the aortic arch. A vascular stent projects over the left axillary region. Unchanged positioning of a right IJ approach central venous catheter with tip projecting over the mid SVC.      Impression    1.Persistent but mildly improved pulmonary edema pattern without underlying effusion or new cardiopulmonary abnormality.  2.Right IJ approach central venous catheter tip projects over the mid SVC.   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      Impression    1.Initial venography and intravenous ultrasound (IVUS) revealed moderate stenosis of the SVC related to large pericatheter thrombus.  2.Mechanical/rheolytic thrombectomy and venoplasty of the SVC. Post-intervention venography and IVUS demonstrated persistent SVC thrombus.  3.Stenting of the SVC with self-expandable uncovered stent (18 x 60 mm Wallstent)  4.Final venography and IVUS demonstrates good flow through the SVC and normal luminal diameter with patent  stent.  5.Exchange of tunneled hemodialysis catheter with placement of a new 14.5 F x 23 cm Palindrome tunneled HD catheter.    Plan:    1.Continue therapeutic anticoagulation with heparin drip, which can be transitioned to outpatient regimen when appropriate. Patient should be on anticoagulation + Plavix for 3-6 months.  2.Hemoglobinuria is expected post-rheolytic thrombectomy. This is not hematuria. Urine should clear within a few days post-procedure.  3.Hemodialysis catheter is ok for immediate use.  _______________________________________________________________    PROCEDURE SUMMARY:  - Radial artery access  - Selective venography as described below  - Tunneled central venous catheter exchange with fluoroscopic guidance  - Mechanical or aspiration thrombectomy  - Venoplasty  - Venous stenting  - Intravascular ultrasound    Complexity: This procedure involved substantially greater physician work, time and complexity than normal.  Additional time required due to complexity: 60 minutes  Reason for additional time required: Complex thrombectomy requiring multiple techniques of clot removal  Procedure start time: 09:58  Procedure end time: 13:17  Total operative time: 199 minutes      PROCEDURE DETAILS:    Pre-procedure  Consent: Informed consent for the procedure including risks, benefits and alternatives was obtained and time-out was performed prior to the procedure.  Preparation: The site was prepared and draped using maximal sterile barrier technique including cutaneous antisepsis.      Anesthesia/sedation  Level of anesthesia/sedation: General anesthesia  Anesthesia/sedation administered by: Anesthesiology      Access  Initial venography was performed through the indwelling hemodialysis catheter, demonstrating catheter tip at the cavoatrial junction with partially imaged clot adherent to the catheter tip.     A 0.035 inch wire was placed through the indwelling tunneled right internal jugular central venous  hemodialysis catheter and directed into the IVC. The catheter was removed over wire with blunt dissection of the cuff from the tract and traction. A 12 French x 45 sheath  was placed through the right internal jugular tunneled tract.       Venography  The veins were catheterized using 5 [place] multipurpose Glide catheter, 0.035 inch Amplatz wire, 0.035 inch Glide advantage wire, 0.035 inch stiff Glide wire.  Indication for venography: Diagnostic angiography - A prior study is available, but the patient's condition with respect to the clinical indication has changed since the prior study.    Vein catheterized: Superior vena cava  Findings: Moderate stenosis of the SVC with partially occlusive intraluminal clot      Intravascular ultrasound  Vessel imaged: Superior vena cava  Indication for IVUS: Assess extent of intraluminal clot, accurate measurement of vessel diameter  Findings: Partially occlusive thrombus within the SVC      Rheolytic thrombectomy  Venous segment treated: Superior vena cava  Thrombectomy device: Angiojet, 8 French (total of 100 seconds of thrombectomy was performed)  Post-thrombectomy venography: Slightly improved intraluminal clot burden. Persistent partially occlusive intraluminal clot      Intravascular ultrasound  Vessel imaged: Superior vena cava  Indication for IVUS: Assess extent of intraluminal clot, accurate measurement of vessel diameter  Findings: Persistent partially occlusive thrombus within the SVC      Venoplasty  Venoplasty location: Superior vena cava  Venoplasty balloon: Mustang 92mm and 67mm high-pressure balloons  Post-intervention venography: Persistent partially occlusive SVC thrombus      Mechanical thrombectomy  Venous segment treated: Superior vena cava  Thrombectomy device: Inari FlowTriever embolectomy discs  Post-thrombectomy venography: Persistent partially occlusive SVC thrombus      Venous stent placement  Venous stent location: Superior vena cava  Venous stent:  Wallstent (18 mm x 60 mm)  Post-stenting venoplasty: Atlas 18 mm balloon  Post-stenting venography: Improved luminal diameter of the SVC without any visible intraluminal clot      Intravascular ultrasound  Vessel imaged: Superior vena cava  Indication for IVUS: Assess patency of newly-placed stent  Findings: Patent stent with normal luminal diameter. No evidence of intraluminal thrombus      Catheter exchange  The 12 French sheath was removed over the wire and a new hemodialysis catheter was advanced over wire under fluoroscopic guidance. Catheter tip location was fluoroscopically verified and a permanent image was stored.   Catheter placed: Palindrome, 23 cm  Catheter size Leeroy Bock): 14.5  Catheter flush: Normal saline      Closure  The catheter was secured. A sterile dressing was applied.  Catheter securement technique: Non-absorbable suture    Contrast  Contrast agent: Isovue 300  Contrast volume (mL): 200    Radiation Dose  Fluoroscopy time (minutes): 30.5    Reference air kerma (mGy): 479.600     Additional Details  Additional description of procedure: None  Equipment details: None  Specimens removed: None  Estimated blood loss (mL): Less than 10  Standardized report: [email or web address]    Attestation  Signer name: Limmie Patricia, MD  I attest that I was present for the entire procedure. I reviewed the stored images and agree with the report as written.   XR AP MOBILE CHEST     Status: None    Narrative    XR AP MOBILE CHEST performed on 08/13/2021 2:28 PM    INDICATION: 46 years old Female; TCC placement    TECHNIQUE: 1 views of the chest; 1 images    COMPARISON: Chest radiograph performed on 08/12/2021    FINDINGS:    Support devices:  Interval replacement of the right IJ dialysis catheter, with the tip terminating at the cavoatrial  junction. Additionally there has been interval stenting of the SVC.    There is silhouetting of the right heart border secondary to increased patchy  opacities in the right mid to lower lung zones. Suspected small left pleural effusion. No visualized pneumothorax.      Impression    1.Interval replacement of the right IJ dialysis catheter and stenting of the SVC.  2.Worsening lung aeration with increased right mid and lower lung zone patchy consolidations concerning for infectious process.     PT/OT: Yes    Consults:   Consult Orders Placed This Encounter   Procedures   . IP CONSULT TO IR - VASCULAR/BODY - ORDERING PROVIDER MUST CALL/PAGE SERVICE   . IP CONSULT TO NEPHROLOGY - ORDERING PROVIDER MUST CALL/PAGE SERVICE   . IP CONSULT TO PAIN MANAGEMENT - ORDERING PROVIDER MUST CALL/PAGE SERVICE   . IP CONSULT TO PSYCHIATRY - ADULT - ORDERING PROVIDER MUST CALL/PAGE SERVICE   . IP CONSULT TO PULMONARY MEDICINE - SLEEP MEDICINE - ORDERING PROVIDER MUST CALL/PAGE SERVICE   . IP CONSULT TO DISCHARGE PHARMACY FOR PRESCRIPTION COST INQUIRY       Hardware (lines, foley's, tubes):   Patient Lines/Drains/Airways Status     Active Line / Dialysis Catheter / Dialysis Graft / Drain / Airway / Wound     Name Placement date Placement time Site Days    Peripheral IV Ultrasound guided Right Forearm 08/12/21  1725  -- 2    Peripheral IV Proximal;Right Basilic  (medial side of arm) 08/12/21  2205  -- 2    AV Fistula Left;Upper Arm 12/05/20  1502  -- 252    Dialysis Catheter Cuffed/Tunneled 08/13/21  1316  -- 1                Assessment/ Plan:   Active Hospital Problems    Diagnosis   . Primary Problem: SVC syndrome   . 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 and tunnel dialysis catheter replacement on 08/13/2021   - s/p IR thrombectomy and stenting and TCC replacement on 6/20  - Plan to switch to Eliquis and Plavix today 6/22    - consult to discharge pharmacy placed to obtain cost inquiry for eliquis    Hypertensive Emergency, resolved  hx of HTN  Hx of HFpEF not in acute exacerbation   - nicardipine drip discontinued     - home anti hypertensive resumed: labetalol 200 mg PO BID, cardizem 120 mg PO daily, clonidine 0.3 transdermal patch weekly, doxazosin 4 mg PO, Imdur, 60 mg PO   - per MICU note - EF 55-60% in 2020   - repeat TTE done this admission with EF 65%, indeterminate diastolic function, severe left ventricular hypertrophy     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 Patient does not want this restarted at this time  - Discussed with nephro - will need additional dialysis session prior to DC to get her back on schedule     Coagulase negative staph bacteremia vs possible blood culture contaminant   - blood cultures drawn at [facility] with 1/4 bottles positive for gram negative staph   - WBC and procal WNL, CRP mildly elevated however could be secondary to thrombus   -  Vanc started on arrival; will continue for now until repeat blood cultures result   - suspect more likely contaminant at this time    OSA   - sleep medicine consulted   - wears 2L NC q HS at home   - CPAP qHS ordered inpatient    Possible Suicidal Ideations   - maintain SI precautions until evaluated by psychiatry   - psych to see today    Chronic back pain   - lidocaine patch, PRN tylenol, flexeril, oxycodone   - pain management consulted                - add 2.5 mg marinol TID    Acute on Chronic Normocytic Anemia   - no current s/s bleeding   - baseline Hgb around 9   - Hgb today 7.2   - transfuse Hgb < 7    Asthma  COPD  Tobacco Use Disorder    - wean oxygen as tolerated    - continue home Symbicort    - continue nicotine patch    - PRN albuterol nebulizers ordered    DVT/PE Prophylaxis: Apixaban    Disposition Planning: pending PT/OT recs     Melanie Crazier, APRN  Department of Emergency Medicine  Department of Hospital Medicine   Pager [phone]    I  personally saw and evaluated the patient. See APP's note for additional details. I agree with the findings and plan of care as documented in the APP's note. Any exceptions/additions are edited/noted. My findings/participation are:    S: No acute events overnight. [name] is tired today but in no distress.     Review of Systems - All other systems reviewed and negative except as noted in subjective/HPI.    O:   Filed Vitals:    08/15/21 0700 08/15/21 0745 08/15/21 1200 08/15/21 1300   BP: (!) 154/126 (!) 149/96  (!) 162/109   Pulse: 77 67  78   Resp: 14   13   Temp:   37 C (98.6 F)    SpO2: (!) 89%   97%         A/P:   SVC syndrome 2/2 occlusive thrombus s/p svc stent  -start Eliquis today; continue Plavix  -will have to monitor H/H which has been down-trending this admission    ESRD  -Continue dialysis     HTN urgency  -have restarted home PO anti-HTN regimen including Cardura, clonidine Imdur, cardizem and labetalol    Staph epi bacteremia  -repeat cultures are positive  -of note, patient had tunneled HD catheter exchanged this admission  -will discuss with ID  -patient had TTE without vegetations noted.       Derenda Mis, MD

## 2021-08-16 ENCOUNTER — Inpatient Hospital Stay (HOSPITAL_COMMUNITY): Payer: Commercial Managed Care - PPO

## 2021-08-16 DIAGNOSIS — T82868A Thrombosis of vascular prosthetic devices, implants and grafts, initial encounter: Principal | ICD-10-CM

## 2021-08-16 DIAGNOSIS — R7881 Bacteremia: Secondary | ICD-10-CM

## 2021-08-16 DIAGNOSIS — N186 End stage renal disease: Secondary | ICD-10-CM

## 2021-08-16 DIAGNOSIS — D631 Anemia in chronic kidney disease: Secondary | ICD-10-CM

## 2021-08-16 DIAGNOSIS — Z992 Dependence on renal dialysis: Secondary | ICD-10-CM

## 2021-08-16 DIAGNOSIS — B957 Other staphylococcus as the cause of diseases classified elsewhere: Secondary | ICD-10-CM

## 2021-08-16 LAB — CBC WITH DIFF
BASOPHIL #: <0.1 10*3/uL (ref <=0.20)
BASOPHIL %: 1 %
EOSINOPHIL #: 0.36 10*3/uL (ref <=0.50)
EOSINOPHIL %: 4 %
HCT: 22.7 % — ABNORMAL LOW (ref 34.8–46.0)
HGB: 7.4 g/dL — ABNORMAL LOW (ref 11.5–16.0)
IMMATURE GRANULOCYTE #: <0.1 10*3/uL (ref <0.10)
IMMATURE GRANULOCYTE %: 1 % (ref 0–1)
LYMPHOCYTE #: 1.75 10*3/uL (ref 1.00–4.80)
LYMPHOCYTE %: 19 %
MCH: 30.2 pg (ref 26.0–32.0)
MCHC: 32.6 g/dL (ref 31.0–35.5)
MCV: 92.7 fL (ref 78.0–100.0)
MONOCYTE #: 1.05 10*3/uL (ref 0.20–1.10)
MONOCYTE %: 11 %
MPV: 9.4 fL (ref 8.7–12.5)
NEUTROPHIL #: 6.14 10*3/uL (ref 1.50–7.70)
NEUTROPHIL %: 64 %
PLATELETS: 247 10*3/uL (ref 150–400)
RBC: 2.45 10*6/uL — ABNORMAL LOW (ref 3.85–5.22)
RDW-CV: 17.6 % — ABNORMAL HIGH (ref 11.5–15.5)
WBC: 9.4 10*3/uL (ref 3.7–11.0)

## 2021-08-16 LAB — BASIC METABOLIC PANEL
ANION GAP: 9 mmol/L (ref 4–13)
BUN/CREA RATIO: 3 — ABNORMAL LOW (ref 6–22)
BUN: 23 mg/dL (ref 8–25)
CALCIUM: 9.3 mg/dL (ref 8.5–10.0)
CHLORIDE: 99 mmol/L (ref 96–111)
CO2 TOTAL: 26 mmol/L (ref 22–30)
CREATININE: 6.82 mg/dL — ABNORMAL HIGH (ref 0.60–1.05)
ESTIMATED GFR: 7 mL/min/BSA — ABNORMAL LOW (ref >=60)
GLUCOSE: 75 mg/dL (ref 65–125)
POTASSIUM: 4.5 mmol/L (ref 3.5–5.1)
SODIUM: 134 mmol/L — ABNORMAL LOW (ref 136–145)

## 2021-08-16 LAB — MAGNESIUM: MAGNESIUM: 1.9 mg/dL (ref 1.8–2.6)

## 2021-08-16 LAB — VANCOMYCIN, RANDOM: VANCOMYCIN RANDOM: 15.3 ug/mL — ABNORMAL LOW (ref 30.0–40.0)

## 2021-08-16 LAB — ADULT ROUTINE BLOOD CULTURE, SET OF 2 BOTTLES (BACTERIA AND YEAST): BLOOD CULTURE, ROUTINE: NO GROWTH

## 2021-08-16 LAB — PHOSPHORUS: PHOSPHORUS: 5.2 mg/dL — ABNORMAL HIGH (ref 2.4–4.7)

## 2021-08-16 MED ORDER — PHENOL 1.4 % MUCOSAL AEROSOL SPRAY
1.0000 | INHALATION_SPRAY | Status: DC | PRN
Start: 2021-08-16 — End: 2021-08-19
  Administered 2021-08-16 – 2021-08-17 (×3): 1 via OROMUCOSAL
  Filled 2021-08-16: qty 177

## 2021-08-16 MED ORDER — HEPARIN (PORCINE) 5,000 UNITS/ML BOLUS FOR DOSE ADJUSTMENT
2000.0000 [IU] | INTRAMUSCULAR | Status: AC
Start: 2021-08-16 — End: 2021-08-16
  Administered 2021-08-16: 2000 [IU] via INTRAVENOUS
  Filled 2021-08-16 (×2): qty 1

## 2021-08-16 MED ORDER — PREMIXED HEMODIALYSATE
INJECTION | INTRAVENOUS | Status: AC
Start: 2021-08-16 — End: 2021-08-16

## 2021-08-16 MED ORDER — SODIUM CITRATE 4 % (3 ML) INTRA-CATHETER INJECTION SYRINGE
2.0000 | INJECTION | Status: AC
Start: 2021-08-16 — End: 2021-08-16
  Administered 2021-08-16: 2
  Filled 2021-08-16 (×2): qty 6

## 2021-08-16 MED ORDER — VANCOMYCIN 10 GRAM INTRAVENOUS SOLUTION
20.0000 mg/kg | INTRAVENOUS | Status: AC
Start: 2021-08-16 — End: 2021-08-16
  Administered 2021-08-16: 1500 mg via INTRAVENOUS
  Administered 2021-08-16: 0 mg via INTRAVENOUS
  Filled 2021-08-16: qty 15

## 2021-08-16 NOTE — Nurses Notes (Addendum)
HD treatment completed. 2L UF. Vitals stable.

## 2021-08-16 NOTE — Pharmacy (Signed)
[facility] / [facility] of Pharmaceutical Services  Therapeutic Drug Monitoring: Vancomycin  08/16/2021      Patient name: [name], [name]  Date of Birth:  01/02/1976    Actual Weight:  Weight: 88.2 kg (194 lb 7.1 oz) (08/14/21 0500)     BMI:         Date RPh Current regimen (including mg/kg) Indication &  Organism AUC or trough based dosing Target Levels^ SCr (mg/dL) CrCl* (mL/min) Measured level(s)   (mcg/mL) Calculated AUC (if AUC based monitoring) Plan & predicted AUC/trough if initial dosing (including when levels are due) Comments   6/20 NLKS Vancomycin to start Empiric, GPC on blood culture 6/18 trough  11.5 ESRD  N/A - Load vancomycin 20 mg/kg post iHD today   - No further vancomycin doses until after repeat iHD session  - f/u blood culture from 6/18 and repeat from 6/20   6/22 PT Intermittent    S/p Vanc 1500mg  6/20 @ 1720 Empiric- Bacteremia Intermittent  Trough 20-25   (pre-HD) 9.5  (HD) 9.4  (HD)   - Pt getting HD today, give Vanc 1000 mg (15 mg/kg) x 1 dose in HD. Obtain pre-HD lvl prior to 3rd HD session (next session).  - update: 6/20 blood cultures: GPCs (confirmed)   6/23 NLD Intermittent - last dose 1 g on 6/22 @ 1102     CoNS bacteremia Trough 20-25   (pre-HD) ESRD ESRD 15.3   (pre-HD level ~23 hrs after last dose) N/A  - Give 1500 mg with HD today    - Follow RRT plans for redosing and level needs                                                                 ^Target levels depends on dosing and monitoring method, AUC vs. trough based. For AUC based dosing units are mg*h/L. For trough based dosing units are mcg/mL.     *Creatinine clearance is estimated by using the Cockcroft-Gault equation for adult patients and the Carol Ada for pediatric patients.    The decision to discontinue vancomycin therapy will be determined by the primary service.  Please contact the pharmacist with any questions regarding this patient's medication regimen.

## 2021-08-16 NOTE — Consults (Signed)
[facility]  Infectious Disease Consult  Follow Up Note    [name], [name], 46 y.o. female  Date of Service: 08/16/2021  Date of Birth:  01-11-1976    Hospital Day:  LOS: 4 days     Reason for Consult: Endovascular infection     Subjective:  Patient is resting in bed. She is very frustrated with this hospitalization and feels like she is not being heard. Reports that she has taken extensive antibiotics in the past and feels they "dont work". Additionally reports getting vaginal yeast infections. Frustrated that she has to take medications because of medication side effects. Discussed concern for infected clot and line and our recommendations would be a 6 week course of antibiotics given with dialysis.     Objective:  Filed Vitals:    08/16/21 0630 08/16/21 0741 08/16/21 0745 08/16/21 1049   BP: (!) 153/93  (!) 158/116    Pulse: 70  67    Resp: (!) 0 17 18 18    Temp:  36.7 C (98.1 F)  36.5 C (97.7 F)   SpO2: 100%  98%      Constitutional: Chronically ill female. Non-toxic in no acute distress.   Neurologic: Alert and oriented to person, place, and time.   Eyes: Sclera non-icteric, conjunctiva non-injected.   HENT: Buccal mucosa moist.   Respiratory: Respirations are non labored on room air.   Cardiovascular: Heart has a regular rate and rhythm on telemetry.    Musculoskeletal: No swelling of the upper or lower extremities.   Integumentary: TCC to the right chest.     Antibiotics:   7. Azithromycin and ceftriaxone August 11, 2021.  8. Vancomycin x1 August 13, 2021.  9. Vancomycin August 15, 2021 -     Lines:   Patient Lines/Drains/Airways Status     Active Line / Dialysis Catheter / Dialysis Graft / Drain / Airway / Wound     Name Placement date Placement time Site Days    Peripheral IV Ultrasound guided Right Forearm 08/12/21  1725  -- 3    AV Fistula Left;Upper Arm 12/05/20  1502  -- 253    Dialysis Catheter Cuffed/Tunneled 08/13/21  1316  -- 2              Labs:  Labs reviewed.   Results for orders placed or  performed during the hospital encounter of 08/12/21 (from the past 24 hour(s))   BASIC METABOLIC PANEL   Result Value Ref Range    SODIUM 134 (L) 136 - 145 mmol/L    POTASSIUM 4.5 3.5 - 5.1 mmol/L    CHLORIDE 99 96 - 111 mmol/L    CO2 TOTAL 26 22 - 30 mmol/L    ANION GAP 9 4 - 13 mmol/L    CALCIUM 9.3 8.5 - 10.0 mg/dL    GLUCOSE 75 65 - 125 mg/dL    BUN 23 8 - 25 mg/dL    CREATININE 6.82 (H) 0.60 - 1.05 mg/dL    BUN/CREA RATIO 3 (L) 6 - 22    ESTIMATED GFR 7 (L) >=60 mL/min/BSA   CBC/DIFF    Narrative    The following orders were created for panel order CBC/DIFF.  Procedure                               Abnormality         Status                     ---------                               -----------         ------  CBC WITH DIFF[[id number]]                Abnormal            Final result                 Please view results for these tests on the individual orders.   MAGNESIUM   Result Value Ref Range    MAGNESIUM 1.9 1.8 - 2.6 mg/dL   PHOSPHORUS   Result Value Ref Range    PHOSPHORUS 5.2 (H) 2.4 - 4.7 mg/dL   CBC WITH DIFF   Result Value Ref Range    WBC 9.4 3.7 - 11.0 x10^3/uL    RBC 2.45 (L) 3.85 - 5.22 x10^6/uL    HGB 7.4 (L) 11.5 - 16.0 g/dL    HCT 22.7 (L) 34.8 - 46.0 %    MCV 92.7 78.0 - 100.0 fL    MCH 30.2 26.0 - 32.0 pg    MCHC 32.6 31.0 - 35.5 g/dL    RDW-CV 17.6 (H) 11.5 - 15.5 %    PLATELETS 247 150 - 400 x10^3/uL    MPV 9.4 8.7 - 12.5 fL    NEUTROPHIL % 64 %    LYMPHOCYTE % 19 %    MONOCYTE % 11 %    EOSINOPHIL % 4 %    BASOPHIL % 1 %    NEUTROPHIL # 6.14 1.50 - 7.70 x10^3/uL    LYMPHOCYTE # 1.75 1.00 - 4.80 x10^3/uL    MONOCYTE # 1.05 0.20 - 1.10 x10^3/uL    EOSINOPHIL # 0.36 <=0.50 x10^3/uL    BASOPHIL # <0.10 <=0.20 x10^3/uL    IMMATURE GRANULOCYTE % 1 0 - 1 %    IMMATURE GRANULOCYTE # <0.10 <0.10 x10^3/uL     Microbiology:   1. Blood cultures x2 sets obtained August 05, 2021, are positive in 1 out of 2 sets for coagulase-negative staphylococci.  2. Blood cultures x2 sets obtained  August 11, 2021, are positive 1 out of 2 sets for coagulase-negative staphylococci.  3. Blood cultures x2 sets obtained August 13, 2021, are positive for Staphylococcus epidermidis.   4. Blood cultures x2 sets, August 15, 2021 are pending.     Imaging Studies:  Imaging reviewed.   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      Impression    1.Initial venography and intravenous ultrasound (IVUS) revealed moderate stenosis of the SVC related to large pericatheter thrombus.  2.Mechanical/rheolytic thrombectomy and venoplasty of the SVC. Post-intervention venography and IVUS demonstrated persistent SVC thrombus.  3.Stenting of the SVC with self-expandable uncovered stent (18 x 60 mm Wallstent)  4.Final venography and IVUS demonstrates good flow through the SVC and normal luminal diameter with patent stent.  5.Exchange of tunneled hemodialysis catheter with placement of a new 14.5 F x 23 cm Palindrome tunneled HD catheter.    Plan:    1.Continue therapeutic anticoagulation with heparin drip, which can be transitioned to outpatient regimen when appropriate. Patient should be on anticoagulation +  Plavix for 3-6 months.  2.Hemoglobinuria is expected post-rheolytic thrombectomy. This is not hematuria. Urine should clear within a few days post-procedure.  3.Hemodialysis catheter is ok for immediate use.  _______________________________________________________________    PROCEDURE SUMMARY:  - Radial artery  access  - Selective venography as described below  - Tunneled central venous catheter exchange with fluoroscopic guidance  - Mechanical or aspiration thrombectomy  - Venoplasty  - Venous stenting  - Intravascular ultrasound    Complexity: This procedure involved substantially greater physician work, time and complexity than normal.  Additional time required due to complexity: 60 minutes  Reason for additional time required: Complex thrombectomy requiring multiple techniques of clot removal  Procedure start time: 09:58  Procedure end time: 13:17  Total operative time: 199 minutes      PROCEDURE DETAILS:    Pre-procedure  Consent: Informed consent for the procedure including risks, benefits and alternatives was obtained and time-out was performed prior to the procedure.  Preparation: The site was prepared and draped using maximal sterile barrier technique including cutaneous antisepsis.      Anesthesia/sedation  Level of anesthesia/sedation: General anesthesia  Anesthesia/sedation administered by: Anesthesiology      Access  Initial venography was performed through the indwelling hemodialysis catheter, demonstrating catheter tip at the cavoatrial junction with partially imaged clot adherent to the catheter tip.     A 0.035 inch wire was placed through the indwelling tunneled right internal jugular central venous hemodialysis catheter and directed into the IVC. The catheter was removed over wire with blunt dissection of the cuff from the tract and traction. A 12 French x 45 sheath was placed through the right internal jugular tunneled tract.       Venography  The veins were catheterized using 5 [place] multipurpose Glide catheter, 0.035 inch Amplatz wire, 0.035 inch Glide advantage wire, 0.035 inch stiff Glide wire.  Indication for venography: Diagnostic angiography - A prior study is available, but the patient's condition with respect to the clinical indication has changed since the prior study.    Vein catheterized:  Superior vena cava  Findings: Moderate stenosis of the SVC with partially occlusive intraluminal clot      Intravascular ultrasound  Vessel imaged: Superior vena cava  Indication for IVUS: Assess extent of intraluminal clot, accurate measurement of vessel diameter  Findings: Partially occlusive thrombus within the SVC      Rheolytic thrombectomy  Venous segment treated: Superior vena cava  Thrombectomy device: Angiojet, 8 French (total of 100 seconds of thrombectomy was performed)  Post-thrombectomy venography: Slightly improved intraluminal clot burden. Persistent partially occlusive intraluminal clot      Intravascular ultrasound  Vessel imaged: Superior vena cava  Indication for IVUS: Assess extent of intraluminal clot, accurate measurement of vessel diameter  Findings: Persistent partially occlusive thrombus within the SVC      Venoplasty  Venoplasty location: Superior vena cava  Venoplasty balloon: Mustang 24mm and 16mm high-pressure balloons  Post-intervention venography: Persistent partially occlusive SVC thrombus      Mechanical thrombectomy  Venous segment treated: Superior vena cava  Thrombectomy device: Inari FlowTriever embolectomy discs  Post-thrombectomy venography: Persistent partially occlusive SVC thrombus      Venous stent placement  Venous stent location: Superior vena cava  Venous stent: Wallstent (18 mm x 60 mm)  Post-stenting venoplasty: Atlas 18 mm balloon  Post-stenting venography: Improved luminal diameter of the SVC without any visible intraluminal clot      Intravascular ultrasound  Vessel imaged: Superior vena cava  Indication for  IVUS: Assess patency of newly-placed stent  Findings: Patent stent with normal luminal diameter. No evidence of intraluminal thrombus      Catheter exchange  The 12 French sheath was removed over the wire and a new hemodialysis catheter was advanced over wire under fluoroscopic guidance. Catheter tip location was fluoroscopically verified and a permanent image  was stored.   Catheter placed: Palindrome, 23 cm  Catheter size Leeroy Bock): 14.5  Catheter flush: Normal saline      Closure  The catheter was secured. A sterile dressing was applied.  Catheter securement technique: Non-absorbable suture    Contrast  Contrast agent: Isovue 300  Contrast volume (mL): 200    Radiation Dose  Fluoroscopy time (minutes): 30.5    Reference air kerma (mGy): 479.600     Additional Details  Additional description of procedure: None  Equipment details: None  Specimens removed: None  Estimated blood loss (mL): Less than 10  Standardized report: [email or web address]    Attestation  Signer name: Limmie Patricia, MD  I attest that I was present for the entire procedure. I reviewed the stored images and agree with the report as written.   XR AP MOBILE CHEST     Status: None    Narrative    XR AP MOBILE CHEST performed on 08/13/2021 2:28 PM    INDICATION: 46 years old Female; TCC placement    TECHNIQUE: 1 views of the chest; 1 images    COMPARISON: Chest radiograph performed on 08/12/2021    FINDINGS:    Support devices:  Interval replacement of the right IJ dialysis catheter, with the tip terminating at the cavoatrial junction. Additionally there has been interval stenting of the SVC.    There is silhouetting of the right heart border secondary to increased patchy opacities in the right mid to lower lung zones. Suspected small left pleural effusion. No visualized pneumothorax.      Impression    1.Interval replacement of the right IJ dialysis catheter and stenting of the SVC.  2.Worsening lung aeration with increased right mid and lower lung zone patchy consolidations concerning for infectious process.     Assessment/Recommendations:  Ms. Maranto is a 46 year old female with a history of hypertension, GERD, end-stage renal disease on iHD MWF, coronary artery disease, and obstructive sleep apnea.  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  Additionally, she has  been identified to have persistent Staphylococcus epidermidis.  Suspect infected TCC with suppurative thrombophlebitis. TCC was exchanged on August 13, 2021, with aspiration thrombectomy and venous stent placement. Repeat blood cultures August 15, 2021 are pending.     1. Continue to follow pending blood cultures.   2. Continue vancomycin dose per pharmacy per protocol. Dosed with dialysis.  3. Recommend a 6-week course of vancomycin from date of clearance of bacteremia, tentatively 08/15/2021.   4. Recommend obtaining surveillance blood cultures from TCC 2 weeks after discontinuation of parenteral antibiotics.   5. Consult OPAT.   6. Refer to ID department as needed.   7. Continue to follow CBC, differential, BMP, LFT panel, CRP.    We will sign off today. Please call with any further questions.     I independently of the faculty provider spent a total of 30 minutes in direct/indirect care of this patient including initial evaluation, review of laboratory, radiology, diagnostic studies, review of medical record, order entry and coordination of care.  Monica Martinez, PA-C

## 2021-08-16 NOTE — Care Plan (Signed)
Problem: Adult Inpatient Plan of Care  Goal: Plan of Care Review  08/16/2021 0452 by Estanislado Spire, RN  Outcome: Ongoing (see interventions/notes)  08/16/2021 0449 by Estanislado Spire, RN  Outcome: Ongoing (see interventions/notes)  08/16/2021 0448 by Estanislado Spire, RN  Outcome: Ongoing (see interventions/notes)  Goal: Patient-Specific Goal (Individualized)  08/16/2021 0452 by Estanislado Spire, RN  Outcome: Ongoing (see interventions/notes)  08/16/2021 0449 by Estanislado Spire, RN  Outcome: Ongoing (see interventions/notes)  08/16/2021 0448 by Estanislado Spire, RN  Outcome: Ongoing (see interventions/notes)  Flowsheets  Taken 08/15/2021 2000  Individualized Care Needs: promote rest  Anxieties, Fears or Concerns: agitated by recent suicide precautions  Patient-Specific Goals (Include Timeframe): rest/ pain control  Plan of Care Reviewed With: patient  Taken 08/15/2021 1957  Individualized Care Needs: promote rest  Anxieties, Fears or Concerns: agitated by recent suicide precautions  Patient-Specific Goals (Include Timeframe): rest/ pain control  Plan of Care Reviewed With: patient  Goal: Absence of Hospital-Acquired Illness or Injury  08/16/2021 0452 by Estanislado Spire, RN  Outcome: Ongoing (see interventions/notes)  08/16/2021 0449 by Estanislado Spire, RN  Outcome: Ongoing (see interventions/notes)  08/16/2021 0448 by Estanislado Spire, RN  Outcome: Ongoing (see interventions/notes)  Intervention: Identify and Manage Fall Risk  Recent Flowsheet Documentation  Taken 08/16/2021 0400 by Estanislado Spire, RN  Safety Promotion/Fall Prevention:   activity supervised   safety round/check completed  Taken 08/16/2021 0200 by Estanislado Spire, RN  Safety Promotion/Fall Prevention: safety round/check completed  Taken 08/16/2021 0015 by Estanislado Spire, RN  Safety Promotion/Fall Prevention: safety round/check completed  Taken 08/15/2021 2200 by Estanislado Spire, RN  Safety Promotion/Fall Prevention:    activity supervised   safety round/check completed  Taken 08/15/2021 2000 by Estanislado Spire, RN  Safety Promotion/Fall Prevention:   activity supervised   fall prevention program maintained   muscle strengthening facilitated   nonskid shoes/slippers when out of bed   safety round/check completed  Intervention: Prevent Skin Injury  Recent Flowsheet Documentation  Taken 08/15/2021 2000 by Estanislado Spire, RN  Skin Protection: adhesive use limited  Taken 08/15/2021 1957 by Estanislado Spire, RN  Body Position: positioned/repositioned independently  Intervention: Prevent and Manage VTE (Venous Thromboembolism) Risk  Recent Flowsheet Documentation  Taken 08/15/2021 2000 by Estanislado Spire, RN  VTE Prevention/Management:   anticoagulant therapy maintained   ambulation promoted  Intervention: Prevent Infection  Recent Flowsheet Documentation  Taken 08/15/2021 2000 by Estanislado Spire, RN  Infection Prevention:   barrier precautions utilized   single patient room provided  Goal: Optimal Comfort and Wellbeing  08/16/2021 0452 by Estanislado Spire, RN  Outcome: Ongoing (see interventions/notes)  08/16/2021 0449 by Estanislado Spire, RN  Outcome: Ongoing (see interventions/notes)  08/16/2021 0448 by Estanislado Spire, RN  Outcome: Ongoing (see interventions/notes)  Intervention: Provide Person-Centered Care  Recent Flowsheet Documentation  Taken 08/15/2021 2000 by Estanislado Spire, [place] Relationship/Rapport:   care explained   choices provided   emotional support provided   empathic listening provided   questions answered   questions encouraged   reassurance provided   thoughts/feelings acknowledged  Taken 08/15/2021 1957 by Estanislado Spire, RN  Trust Relationship/Rapport:   care explained   choices provided   emotional support provided   empathic listening provided   questions answered   questions encouraged   reassurance provided   thoughts/feelings acknowledged  Goal: Rounds/Family Conference  08/16/2021 0452  by Estanislado Spire, RN  Outcome: Ongoing (see interventions/notes)  08/16/2021 0449 by Estanislado Spire, RN  Outcome: Ongoing (see interventions/notes)  08/16/2021  70 by Estanislado Spire, RN  Outcome: Ongoing (see interventions/notes)     Problem: Fall Injury Risk  Goal: Absence of Fall and Fall-Related Injury  08/16/2021 0452 by Estanislado Spire, RN  Outcome: Ongoing (see interventions/notes)  08/16/2021 0449 by Estanislado Spire, RN  Outcome: Ongoing (see interventions/notes)  08/16/2021 0448 by Estanislado Spire, RN  Outcome: Ongoing (see interventions/notes)  Intervention: Promote Injury-Free Environment  Recent Flowsheet Documentation  Taken 08/16/2021 0400 by Estanislado Spire, RN  Safety Promotion/Fall Prevention:   activity supervised   safety round/check completed  Taken 08/16/2021 0200 by Estanislado Spire, RN  Safety Promotion/Fall Prevention: safety round/check completed  Taken 08/16/2021 0015 by Estanislado Spire, RN  Safety Promotion/Fall Prevention: safety round/check completed  Taken 08/15/2021 2200 by Estanislado Spire, RN  Safety Promotion/Fall Prevention:   activity supervised   safety round/check completed  Taken 08/15/2021 2000 by Estanislado Spire, RN  Safety Promotion/Fall Prevention:   activity supervised   fall prevention program maintained   muscle strengthening facilitated   nonskid shoes/slippers when out of bed   safety round/check completed     Problem: Hemodialysis  Goal: Safe, Effective Therapy Delivery  08/16/2021 0452 by Estanislado Spire, RN  Outcome: Ongoing (see interventions/notes)  08/16/2021 0449 by Estanislado Spire, RN  Outcome: Ongoing (see interventions/notes)  Goal: Effective Tissue Perfusion  08/16/2021 0452 by Estanislado Spire, RN  Outcome: Ongoing (see interventions/notes)  08/16/2021 0449 by Estanislado Spire, RN  Outcome: Ongoing (see interventions/notes)  Goal: Absence of Infection Signs and Symptoms  08/16/2021 0452 by Estanislado Spire, RN  Outcome:  Ongoing (see interventions/notes)  08/16/2021 0449 by Estanislado Spire, RN  Outcome: Ongoing (see interventions/notes)  Intervention: Prevent or Manage Infection  Recent Flowsheet Documentation  Taken 08/15/2021 2000 by Estanislado Spire, RN  Fever Reduction/Comfort Measures:   lightweight bedding   lightweight clothing  Infection Prevention:   barrier precautions utilized   single patient room provided     Problem: Chronic Kidney Disease  Goal: Optimal Coping with Chronic Illness  08/16/2021 0452 by Estanislado Spire, RN  Outcome: Ongoing (see interventions/notes)  08/16/2021 0449 by Estanislado Spire, RN  Outcome: Ongoing (see interventions/notes)  Intervention: Support Psychosocial Response  Recent Flowsheet Documentation  Taken 08/15/2021 2000 by Estanislado Spire, RN  Family/Support System Care: self-care encouraged  Taken 08/15/2021 1957 by Estanislado Spire, RN  Family/Support System Care: self-care encouraged  Goal: Electrolyte Balance  08/16/2021 0452 by Estanislado Spire, RN  Outcome: Ongoing (see interventions/notes)  08/16/2021 0449 by Estanislado Spire, RN  Outcome: Ongoing (see interventions/notes)  Goal: Fluid Balance  08/16/2021 0452 by Estanislado Spire, RN  Outcome: Ongoing (see interventions/notes)  08/16/2021 0449 by Estanislado Spire, RN  Outcome: Ongoing (see interventions/notes)  Intervention: Monitor and Manage Hypervolemia  Recent Flowsheet Documentation  Taken 08/15/2021 2000 by Estanislado Spire, RN  Skin Protection: adhesive use limited  Goal: Optimal Functional Ability  08/16/2021 0452 by Estanislado Spire, RN  Outcome: Ongoing (see interventions/notes)  08/16/2021 0449 by Estanislado Spire, RN  Outcome: Ongoing (see interventions/notes)  Goal: Absence of Anemia Signs and Symptoms  08/16/2021 0452 by Estanislado Spire, RN  Outcome: Ongoing (see interventions/notes)  08/16/2021 0449 by Estanislado Spire, RN  Outcome: Ongoing (see interventions/notes)  Goal: Optimal Oral Intake  08/16/2021  0452 by Estanislado Spire, RN  Outcome: Ongoing (see interventions/notes)  08/16/2021 0449 by Estanislado Spire, RN  Outcome: Ongoing (see interventions/notes)  Goal: Acceptable Pain Control  08/16/2021 0452 by Estanislado Spire, RN  Outcome: Ongoing (see interventions/notes)  08/16/2021 0449 by Estanislado Spire, RN  Outcome: Ongoing (see  interventions/notes)  Goal: Minimize Renal Failure Effects  08/16/2021 0452 by Estanislado Spire, RN  Outcome: Ongoing (see interventions/notes)  08/16/2021 0449 by Estanislado Spire, RN  Outcome: Ongoing (see interventions/notes)  Intervention: Protect and Monitor Dialysis Access Site  Recent Flowsheet Documentation  Taken 08/16/2021 0400 by Estanislado Spire, RN  Safety Precautions: emergency equipment at bedside  Taken 08/16/2021 0200 by Estanislado Spire, RN  Safety Precautions: emergency equipment at bedside  Taken 08/16/2021 0015 by Estanislado Spire, RN  Safety Precautions: emergency equipment at bedside  Taken 08/15/2021 2200 by Estanislado Spire, RN  Safety Precautions: emergency equipment at bedside  Taken 08/15/2021 2000 by Estanislado Spire, RN  Safety Precautions: emergency equipment at bedside

## 2021-08-16 NOTE — Consults (Signed)
Dialysis Services follow-up:  Date of service: 08/16/2021  Hospital Day:  LOS: 4 days     Assessment: 46 year old female with PMH of ESRD (history of non-compliance), chronic CHF, chronic atypical chest pain, GERD, tobacco use,asthma, COPD and uncontrolled HTN. Presents as a transfer from [place] where she presented w/ shortness of breath, wheezing  and diffuse volume overload after missing hemodialysis due to transportation issues. 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 Last HD was 6/19 at outside facility prior to transfer. Nephrology consulted for dialysis management.      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, etiology- hypertensive nephrosclerosis      Lytes: All within acceptable parameters  A/B: Bicarb 26, stable  CKD MB:Ca, mag, phos at goal  Sensipar 30 mg daily  Anemia: Hgb 7.4  Mircera 150 mcg q2wks (unknown last dose)  S/p 2 units PRBC this admission  Given Ferrlecit 6/22  ID  BCx: 6/22 pending  UCx: none  Antibiotics: azithromycin (6/18), ceftriaxone (6/18)  Cardiology: EF 65%, LVSF normal, RVSF cannot be assessed, RVSP indeterminate  Volume status  Appears euvolemic  UO: not quantified but still makes small amt of urine  06/22 0700 - 06/23 0659  In: 358.6 [I.V.:48.6]  Out: 3800 [Dialysis:3800]  Labs  Recent Labs     08/14/21  0423 08/15/21  0605 08/16/21  0643   SODIUM 135* 134* 134*   POTASSIUM 4.5 5.1 4.5   CHLORIDE 99 95* 99   CO2 26 25 26    BUN 27* 42* 23   CREATININE 6.65* 9.50* 6.82*   GFR 7* 5* 7*   ANIONGAP 10 14* 9     Recent Labs     08/14/21  0423 08/15/21  0605 08/16/21  0643   CO2 26 25 26      Recent Labs     08/14/21  0423 08/15/21  0605 08/16/21  0643   CALCIUM 9.4 9.4 9.3   MAGNESIUM 1.9 2.0 1.9   PHOSPHORUS 6.5* 7.1* 5.2*      No results found for: Tharon Aquas  PTH   Date Value Ref Range Status   08/06/2021 249.5 (H) 12.0 - 88.0 pg/mL Final     Recent Labs     08/11/21  1330 08/11/21  2220 08/16/21  0643   WBC 13.4*   < > 9.4   HGB 6.0*   < > 7.4*   HCT 18.6*   < > 22.7*   PLTCNT 369   < > 247   PMNS 83*   < > 64   LYMPHOCYTES 8*  --   --    EOSINOPHIL 2  --   --     < > = values in this interval not displayed.     Lab Results   Component Value Date/Time    IRONBINDCAP 263 08/05/2021 06:03 PM    IRONSAT 13 (L) 08/05/2021 06:03 PM    FERRITIN 940 (H) 08/05/2021 06:03 PM     No results for input(s): FOLATE, VITB12 in the last 72 hours.  No results found for any visits on 08/12/21 (from the past 24 hour(s)).      Evaluated during dialysis, tolerating well but  voiced dissatisfaction with her stay. Updated pt on her labs and provided dietary education, plan for 3 liters off, access working well [facility] [place]. Current BFR: 350 mL/min, venous pressure: 150 mmHg, arterial pressure: -180 mmHg.    Recommendations:  ~HD today to get back on schedule, will aim for 2-2.5kg UF as tolerated  ~Pt to f/u with her outpt dialysis unit for AVF creation  ~BMP/mag/phos on dialysis days      acetaminophen (TYLENOL) tablet, 650 mg, Oral, Q4H PRN  albuterol (PROVENTIL) 2.5 mg / 3 mL (0.083%) neb solution, 2.5 mg, Nebulization, Q4H PRN  apixaban (ELIQUIS) tablet, 10 mg, Oral, 2x/day   Followed by  Derrill Memo ON 08/22/2021] apixaban (ELIQUIS) tablet, 5 mg, Oral, 2x/day  budesonide-formoterol (SYMBICORT) 160 mcg-4.5 mcg per inhalation oral inhaler - "Respiratory to administer", 2 Puff, Inhalation, 2x/day  cinacalcet (SENSIPAR) tablet, 30 mg, Oral, QPM  cloNIDine (CATAPRES-TTS) transdermal patch 0.3 mg, 0.3 mg, Transdermal, Q7 Days  clopidogrel (PLAVIX) 75 mg tablet, 75 mg, Oral, Daily  cyclobenzaprine (FLEXERIL) tablet, 5 mg, Oral, 2x/day PRN  D5W 250 mL flush bag, , Intravenous, Q15 Min PRN  dilTIAZem (CARDIZEM CD) 24 hr extended release capsule,  120 mg, Oral, Daily  doxazosin (CARDURA) tablet, 4 mg, Oral, QPM  dronabinol (MARINOL) capsule, 2.5 mg, Oral, 3x/day  hydrALAZINE (APRESOLINE) injection 10 mg, 10 mg, Intravenous, Q1H PRN  isosorbide mononitrate (IMDUR) 24 hr extended release tablet, 60 mg, Oral, QAM  labetalol (NORMODYNE) tablet, 200 mg, Oral, Q12H  lidocaine-menthol (LIDOPATCH) 3.6%-1.25% patch, 1 Patch, Transdermal, Daily  montelukast (SINGULAIR) 10 mg tablet, 10 mg, Oral, QPM  nicotine (NICODERM CQ) transdermal patch (mg/24 hr), 14 mg, Transdermal, Daily  NS 250 mL flush bag, , Intravenous, Q15 Min PRN  NS flush syringe, 2-6 mL, Intracatheter, Q8HRS  NS flush syringe, 2-6 mL, Intracatheter, Q1 MIN PRN  ondansetron (ZOFRAN) 2 mg/mL injection, 4 mg, Intravenous, Q8H PRN  oxyCODONE (ROXICODONE) immediate release tablet, 2.5 mg, Oral, Q4H PRN   Or  oxyCODONE (ROXICODONE) immediate release tablet, 5 mg, Oral, Q4H PRN  pantoprazole (PROTONIX) delayed release tablet, 40 mg, Oral, Daily  polyethylene glycol (MIRALAX) oral packet, 17 g, Oral, Daily  premixed hemodialysate (NATURALYTE) 3.43 L with potassium chloride 2 mEq/L, calcium chloride 2.5 mEq/L, , Hemodialysis, Give in Dialysis  sennosides-docusate sodium (SENOKOT-S) 8.6-50mg  per tablet, 1 Tablet, Oral, 2x/day  [START ON 08/17/2021] sertraline (ZOLOFT) tablet, 50 mg, Oral, Daily  sodium citrate 4% injection syringe, 2 Syringe, Intracatheter, Give in Dialysis  vancomycin (VANCOCIN) 1,500 mg in NS 500 mL IVPB, 20 mg/kg (Adjusted), Intravenous, Give in Dialysis  Vancomycin IV - Pharmacist to Dose per Protocol, , Does not apply, Daily PRN  Vancomycin IV Intermittent Dosing, , Does not apply, Daily PRN        I, independently of the faculty provider, spent a total of 25 minutes in direct/indirect care of this patient including initial evaluation, review of laboratory, radiology, diagnostic studies, review of medical record, order entry and coordination of care.      Meredeth Ide, APRN 08/16/2021,  11:52  Nephrology Consult team 1      Attending attestation note:    I reviewed NP Neuman's note.  I agree with the findings and plan of care as documented in the NP's note. Any additions/changes were made accordingly.      08/16/2021, 18:11    Jacqlyn Larsen, M.D., M.S., FASN  Assistant Professor of Medicine  Section of Nephrology

## 2021-08-16 NOTE — Consults (Signed)
Patient Name: Keanu Frickey  Date of Birth: January 21, 1976  MRN: [id number]    Date of Service: 08/16/2021  PSYCHIATRY FOLLOW UP  [place] on Wednesday 7/5 at 9:30am.   [place], [place] [address]  Phone: [phone]  Added to d/c instructions for patient review.  Jerlyn Ly, CASE MANAGER  08/16/2021  12:06  Psychiatry Consults

## 2021-08-16 NOTE — Care Management Notes (Signed)
[place] [facility] Management Note    Patient Name: [name]  Date of Birth: 1975-04-23  Sex: female  Date/Time of Admission: 08/12/2021  3:00 PM  Room/Bed: 01/A  Payor: [employer] MEDICARE / Plan: [employer] CHOICE PPO / Product Type: PPO /    LOS: 4 days   Primary Care Providers:  Cam Hai, FNP, FNP (General)    Admitting Diagnosis:  Hypertensive urgency [I16.0]    Assessment:      08/16/21 1549   Assessment Details   Assessment Type Continued Assessment   Date of Care Management Update 08/16/21   Date of Next DCP Update 08/19/21   Care Management Plan   Discharge Planning Status plan in progress   Projected Discharge Date 08/19/21   Discharge Needs Assessment   Discharge Facility/Level of Care Needs Home (Patient/Family Member/other)(code 1)   Transportation Available Medicaid transportation     Per service, patient will need 6 weeks of IV abx at D/C. Pt should be able to receive IV abx through dialysis agency. Pt typically receives dialysis MWF at [facility] with a chair time of 11:50. MSW spoke with [facility], [phone], who stated they did have Vancomycin and would be able to provide IV abx to patient at D/C. [facility] will need an order faxed to them at [phone] after [facility] places their note. Pt will also require Medicaid transportation home at D/C as she does not have another ride. Will continue to follow.     Discharge Plan:  Home (Patient/Family Member/other) (code 1)      The patient will continue to be evaluated for developing discharge needs.     Case Manager: Joan Flores, [place]  Phone: [phone]

## 2021-08-16 NOTE — Progress Notes (Signed)
[facility]  Medicine Progress Note  Full Code   [name]  Date of service: 08/16/2021  Date of Admission:  08/12/2021    Hospital Day:  LOS: 4 days      Subjective: NAEO. Informed pt of plan to continue abx for 6 weeks and that this can be done with dialysis. She verbalized understanding of this. She otherwise has no complaints at this time. Of note, per the patient she does live several hours away and will need a ride. CM aware and helping with transportation arrangement.     Vital Signs:  Temp  Avg: 36.7 C (98 F)  Min: 36.4 C (97.6 F)  Max: 37 C (98.6 F)    Pulse  Avg: 71.3  Min: 60  Max: 88 BP  Min: 118/78  Max: 172/116   Resp  Avg: 12.1  Min: 0  Max: 23 SpO2  Avg: 98.7 %  Min: 95 %  Max: 100 %          Input/Output    Intake/Output Summary (Last 24 hours) at 08/16/2021 1130  Last data filed at 08/15/2021 1300  Gross per 24 hour   Intake 234.2 ml   Output 3800 ml   Net -3565.8 ml    I/O last shift:  No intake/output data recorded.   acetaminophen (TYLENOL) tablet, 650 mg, Oral, Q4H PRN  albuterol (PROVENTIL) 2.5 mg / 3 mL (0.083%) neb solution, 2.5 mg, Nebulization, Q4H PRN  apixaban (ELIQUIS) tablet, 10 mg, Oral, 2x/day   Followed by  Derrill Memo ON 08/22/2021] apixaban (ELIQUIS) tablet, 5 mg, Oral, 2x/day  budesonide-formoterol (SYMBICORT) 160 mcg-4.5 mcg per inhalation oral inhaler - "Respiratory to administer", 2 Puff, Inhalation, 2x/day  cinacalcet (SENSIPAR) tablet, 30 mg, Oral, QPM  cloNIDine (CATAPRES-TTS) transdermal patch 0.3 mg, 0.3 mg, Transdermal, Q7 Days  clopidogrel (PLAVIX) 75 mg tablet, 75 mg, Oral, Daily  cyclobenzaprine (FLEXERIL) tablet, 5 mg, Oral, 2x/day PRN  D5W 250 mL flush bag, , Intravenous, Q15 Min PRN  dilTIAZem (CARDIZEM CD) 24 hr extended release capsule, 120 mg, Oral, Daily  doxazosin (CARDURA) tablet, 4 mg, Oral, QPM  dronabinol (MARINOL) capsule, 2.5 mg, Oral, 3x/day  hydrALAZINE (APRESOLINE) injection 10 mg, 10 mg, Intravenous, Q1H PRN  isosorbide mononitrate  (IMDUR) 24 hr extended release tablet, 60 mg, Oral, QAM  labetalol (NORMODYNE) tablet, 200 mg, Oral, Q12H  lidocaine-menthol (LIDOPATCH) 3.6%-1.25% patch, 1 Patch, Transdermal, Daily  montelukast (SINGULAIR) 10 mg tablet, 10 mg, Oral, QPM  nicotine (NICODERM CQ) transdermal patch (mg/24 hr), 14 mg, Transdermal, Daily  NS 250 mL flush bag, , Intravenous, Q15 Min PRN  NS flush syringe, 2-6 mL, Intracatheter, Q8HRS  NS flush syringe, 2-6 mL, Intracatheter, Q1 MIN PRN  ondansetron (ZOFRAN) 2 mg/mL injection, 4 mg, Intravenous, Q8H PRN  oxyCODONE (ROXICODONE) immediate release tablet, 2.5 mg, Oral, Q4H PRN   Or  oxyCODONE (ROXICODONE) immediate release tablet, 5 mg, Oral, Q4H PRN  pantoprazole (PROTONIX) delayed release tablet, 40 mg, Oral, Daily  polyethylene glycol (MIRALAX) oral packet, 17 g, Oral, Daily  sennosides-docusate sodium (SENOKOT-S) 8.6-50mg  per tablet, 1 Tablet, Oral, 2x/day  [START ON 08/17/2021] sertraline (ZOLOFT) tablet, 50 mg, Oral, Daily  Vancomycin IV - Pharmacist to Dose per Protocol, , Does not apply, Daily PRN  Vancomycin IV Intermittent Dosing, , Does not apply, Daily PRN        Physical Exam:  Constitutional:  appears chronically ill, appears stated age and no distress. Vitals reviewed   Eyes:  Conjunctiva clear., Pupils equal and  round, reactive to light and accomodation. , Sclera non-icteric.   ENT:  Mouth mucous membranes moist. , Pharynx without injection or exudate.   Neck:  supple, symmetrical, trachea midline  Respiratory:  clear but diminished  Cardiovascular:  regular rate and rhythm, S1, S2 normal, no murmur, click, rub or gallop  Gastrointestinal:  Soft, non-tender, Bowel sounds normal, Non-tender, non-distended  Musculoskeletal:  Head atraumatic and normocephalic  Integumentary:  Skin warm and dry  Neurologic:  Alert and oriented x3, No tremor, No asterixis  Psychiatric:  Affect Normal, Speech normal      Labs: I have reviewed all labs  CBC Results Differential Results   Recent  Results (from the past 30 hour(s))   CBC WITH DIFF    Collection Time: 08/16/21  6:43 AM   Result Value    WBC 9.4    HGB 7.4 (L)    HCT 22.7 (L)    PLATELETS 247    Recent Results (from the past 30 hour(s))   CBC WITH DIFF    Collection Time: 08/16/21  6:43 AM   Result Value    WBC 9.4    NEUTROPHIL % 64    MONOCYTE % 11    BASOPHIL % 1    BASOPHIL # <0.10      BMP Results Other Chemistries Results   Results for orders placed or performed during the hospital encounter of 08/12/21 (from the past 30 hour(s))   BASIC METABOLIC PANEL    Collection Time: 08/16/21  6:43 AM   Result Value    SODIUM 134 (L)    POTASSIUM 4.5    CHLORIDE 99    CO2 TOTAL 26    GLUCOSE 75    BUN 23    CREATININE 6.82 (H)    Recent Results (from the past 30 hour(s))   PHOSPHORUS    Collection Time: 08/16/21  6:43 AM   Result Value    PHOSPHORUS 5.2 (H)   MAGNESIUM    Collection Time: 08/16/21  6:43 AM   Result Value    MAGNESIUM 1.9      Liver/Pancreas Enzyme Results Liver Function Results   No results found for this or any previous visit (from the past 30 hour(s)). No results found for this or any previous visit (from the past 30 hour(s)).   Cardiac Results Coags Results   No results found for this or any previous visit (from the past 30 hour(s)). Recent Results (from the past 30 hour(s))   PTT (PARTIAL THROMBOPLASTIN TIME) - ONCE    Collection Time: 08/15/21  6:05 AM   Result Value    APTT 86.2 (H)        Radiology: I have reviewed all imaging  Results for orders placed or performed during the hospital encounter of 08/12/21 (from the past 72 hour(s))   IR ORDERABLE     Status: None    Narrative    PROCEDURE: Diagnostic venography and interventions; Tunneled central venous catheter exchange    Procedural Personnel  Attending physician(s): Limmie Patricia, MD  Resident physician(s): Jacky Kindle, MD    Pre-procedure diagnosis: SVC syndrome  Post-procedure diagnosis: Same  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      Impression    1.Initial venography and intravenous ultrasound (IVUS) revealed moderate stenosis of the SVC related to large pericatheter thrombus.  2.Mechanical/rheolytic thrombectomy and venoplasty of the SVC. Post-intervention venography and IVUS demonstrated persistent  SVC thrombus.  3.Stenting of the SVC with self-expandable uncovered stent (18 x 60 mm Wallstent)  4.Final venography and IVUS demonstrates good flow through the SVC and normal luminal diameter with patent stent.  5.Exchange of tunneled hemodialysis catheter with placement of a new 14.5 F x 23 cm Palindrome tunneled HD catheter.    Plan:    1.Continue therapeutic anticoagulation with heparin drip, which can be transitioned to outpatient regimen when appropriate. Patient should be on anticoagulation + Plavix for 3-6 months.  2.Hemoglobinuria is expected post-rheolytic thrombectomy. This is not hematuria. Urine should clear within a few days post-procedure.  3.Hemodialysis catheter is ok for immediate use.  _______________________________________________________________    PROCEDURE SUMMARY:  - Radial artery access  - Selective venography as described below  - Tunneled central venous catheter exchange with fluoroscopic guidance  - Mechanical or aspiration thrombectomy  - Venoplasty  - Venous stenting  - Intravascular ultrasound    Complexity: This procedure involved substantially greater physician work, time and complexity than normal.  Additional time required due to complexity: 60 minutes  Reason for additional time required: Complex thrombectomy requiring multiple techniques of clot removal  Procedure start time: 09:58  Procedure end time: 13:17  Total operative time: 199 minutes      PROCEDURE DETAILS:    Pre-procedure  Consent: Informed consent for the procedure including risks, benefits and alternatives was obtained and time-out was performed prior to the  procedure.  Preparation: The site was prepared and draped using maximal sterile barrier technique including cutaneous antisepsis.      Anesthesia/sedation  Level of anesthesia/sedation: General anesthesia  Anesthesia/sedation administered by: Anesthesiology      Access  Initial venography was performed through the indwelling hemodialysis catheter, demonstrating catheter tip at the cavoatrial junction with partially imaged clot adherent to the catheter tip.     A 0.035 inch wire was placed through the indwelling tunneled right internal jugular central venous hemodialysis catheter and directed into the IVC. The catheter was removed over wire with blunt dissection of the cuff from the tract and traction. A 12 French x 45 sheath was placed through the right internal jugular tunneled tract.       Venography  The veins were catheterized using 5 [place] multipurpose Glide catheter, 0.035 inch Amplatz wire, 0.035 inch Glide advantage wire, 0.035 inch stiff Glide wire.  Indication for venography: Diagnostic angiography - A prior study is available, but the patient's condition with respect to the clinical indication has changed since the prior study.    Vein catheterized: Superior vena cava  Findings: Moderate stenosis of the SVC with partially occlusive intraluminal clot      Intravascular ultrasound  Vessel imaged: Superior vena cava  Indication for IVUS: Assess extent of intraluminal clot, accurate measurement of vessel diameter  Findings: Partially occlusive thrombus within the SVC      Rheolytic thrombectomy  Venous segment treated: Superior vena cava  Thrombectomy device: Angiojet, 8 French (total of 100 seconds of thrombectomy was performed)  Post-thrombectomy venography: Slightly improved intraluminal clot burden. Persistent partially occlusive intraluminal clot      Intravascular ultrasound  Vessel imaged: Superior vena cava  Indication for IVUS: Assess extent of intraluminal clot, accurate measurement of vessel  diameter  Findings: Persistent partially occlusive thrombus within the SVC      Venoplasty  Venoplasty location: Superior vena cava  Venoplasty balloon: Mustang 63mm and 74mm high-pressure balloons  Post-intervention venography: Persistent partially occlusive SVC thrombus      Mechanical thrombectomy  Venous segment treated: Superior vena cava  Thrombectomy device: Inari FlowTriever embolectomy discs  Post-thrombectomy venography: Persistent partially occlusive SVC thrombus      Venous stent placement  Venous stent location: Superior vena cava  Venous stent: Wallstent (18 mm x 60 mm)  Post-stenting venoplasty: Atlas 18 mm balloon  Post-stenting venography: Improved luminal diameter of the SVC without any visible intraluminal clot      Intravascular ultrasound  Vessel imaged: Superior vena cava  Indication for IVUS: Assess patency of newly-placed stent  Findings: Patent stent with normal luminal diameter. No evidence of intraluminal thrombus      Catheter exchange  The 12 French sheath was removed over the wire and a new hemodialysis catheter was advanced over wire under fluoroscopic guidance. Catheter tip location was fluoroscopically verified and a permanent image was stored.   Catheter placed: Palindrome, 23 cm  Catheter size Leeroy Bock): 14.5  Catheter flush: Normal saline      Closure  The catheter was secured. A sterile dressing was applied.  Catheter securement technique: Non-absorbable suture    Contrast  Contrast agent: Isovue 300  Contrast volume (mL): 200    Radiation Dose  Fluoroscopy time (minutes): 30.5    Reference air kerma (mGy): 479.600     Additional Details  Additional description of procedure: None  Equipment details: None  Specimens removed: None  Estimated blood loss (mL): Less than 10  Standardized report: [email or web address]    Attestation  Signer name: Limmie Patricia, MD  I attest that I was present for the entire procedure. I reviewed the stored images and agree with  the report as written.   XR AP MOBILE CHEST     Status: None    Narrative    XR AP MOBILE CHEST performed on 08/13/2021 2:28 PM    INDICATION: 46 years old Female; TCC placement    TECHNIQUE: 1 views of the chest; 1 images    COMPARISON: Chest radiograph performed on 08/12/2021    FINDINGS:    Support devices:  Interval replacement of the right IJ dialysis catheter, with the tip terminating at the cavoatrial junction. Additionally there has been interval stenting of the SVC.    There is silhouetting of the right heart border secondary to increased patchy opacities in the right mid to lower lung zones. Suspected small left pleural effusion. No visualized pneumothorax.      Impression    1.Interval replacement of the right IJ dialysis catheter and stenting of the SVC.  2.Worsening lung aeration with increased right mid and lower lung zone patchy consolidations concerning for infectious process.     PT/OT: Yes    Consults:   Consult Orders Placed This Encounter   Procedures   . IP CONSULT TO IR - VASCULAR/BODY - ORDERING PROVIDER MUST CALL/PAGE SERVICE   . IP CONSULT TO NEPHROLOGY - ORDERING PROVIDER MUST CALL/PAGE SERVICE   . IP CONSULT TO PAIN MANAGEMENT - ORDERING PROVIDER MUST CALL/PAGE SERVICE   . IP CONSULT TO PSYCHIATRY - ADULT - ORDERING PROVIDER MUST CALL/PAGE SERVICE   . IP CONSULT TO PULMONARY MEDICINE - SLEEP MEDICINE - ORDERING PROVIDER MUST CALL/PAGE SERVICE   . IP CONSULT TO DISCHARGE PHARMACY FOR PRESCRIPTION COST INQUIRY   . IP CONSULT TO INFECTIOUS DISEASES - ORDERING PROVIDER MUST CALL/PAGE SERVICE   . IP CONSULT TO OPAT (OUTPATIENT PARENTERAL ANTIMICROBIAL THERAPY PROGRAM)   . IP CONSULT TO OPAT (OUTPATIENT PARENTERAL ANTIMICROBIAL THERAPY PROGRAM)       Hardware (lines, foley's, tubes):  Patient Lines/Drains/Airways Status     Active Line / Dialysis Catheter / Dialysis Graft / Drain / Airway / Wound     Name Placement date Placement time Site Days    Peripheral IV Ultrasound guided Right Forearm  08/12/21  1725  -- 3    AV Fistula Left;Upper Arm 12/05/20  1502  -- 253    Dialysis Catheter Cuffed/Tunneled 08/13/21  1316  -- 2                Assessment/ Plan:   Active Hospital Problems    Diagnosis   . Primary Problem: SVC syndrome   . 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 and tunnel dialysis catheter replacement on 08/13/2021   - s/p IR thrombectomy and stenting and TCC replacement on 6/20  - Plan to switch to Eliquis and Plavix today 6/22    - consult to discharge pharmacy placed to obtain cost inquiry for eliquis    Hypertensive Emergency, resolved  hx of HTN  Hx of HFpEF not in acute exacerbation   - nicardipine drip discontinued    - home anti hypertensive resumed: labetalol 200 mg PO BID, cardizem 120 mg PO daily, clonidine 0.3 transdermal patch weekly, doxazosin 4 mg PO, Imdur, 60 mg PO   - per MICU note - EF 55-60% in 2020   - repeat TTE done this admission with EF 65%, indeterminate diastolic function, severe left ventricular hypertrophy     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 Patient does not want this restarted at this time  - Discussed with nephro - will need additional dialysis session prior to DC to get her back on schedule     Coagulase negative staph bacteremia vs possible blood culture contaminant   - blood cultures drawn at [facility] with 1/4 bottles positive for gram negative staph   - WBC and procal WNL, CRP mildly elevated however could be secondary to thrombus   - Vanc started on arrival; will continue for now until repeat blood cultures result  - Given persistently positive cultures, ID consulted   - Plan for 6 weeks of Vanc   - OPAT consulted     OSA   - sleep medicine consulted   - wears 2L NC q HS at home   - CPAP qHS  ordered inpatient    Possible Suicidal Ideations   - psych following, low concern at this time  - DC SI precautions     Chronic back pain   - lidocaine patch, PRN tylenol, flexeril, oxycodone   - pain management consulted                - add 2.5 mg marinol TID    Acute on Chronic Normocytic Anemia   - no current s/s bleeding   - baseline Hgb around 9   - Hgb today 7.2   - transfuse Hgb < 7    Asthma  COPD  Tobacco Use Disorder    - wean oxygen as tolerated    - continue home Symbicort    - continue nicotine patch    - PRN albuterol nebulizers ordered    DVT/PE Prophylaxis: Apixaban    Disposition Planning: Home discharge  Melanie Crazier, APRN  Department of Emergency Medicine  Department of [facility] Medicine   Pager [phone]    I personally saw and evaluated the patient. See APP's note for additional details. I agree with the findings and plan of care as documented in the APP's note. Any exceptions/additions are edited/noted. My findings/participation are:    S: No acute events overnight. Mrs. Watterson is in no distress and has no complaints.     Review of Systems - All other systems reviewed and negative except as noted in subjective/HPI.    O:   Filed Vitals:    08/16/21 1420 08/16/21 1450 08/16/21 1520 08/16/21 1700   BP:    (!) 161/95   Pulse:    69   Resp:       Temp:       SpO2: 99% 99% 99%          A/P:   Coag negative Staph bacteremia  -6 weeks of Vancomycin.     Derenda Mis, MD

## 2021-08-16 NOTE — Consults (Signed)
[facility]  Psychiatry Consults- Subsequent Encounter    [name]  [birth year]  [id number]    Date of service: 08/16/21    CHIEF COMPLAINT/REASON FOR CONSULT: Follow up SI    ASSESSMENT:  [name] is a 46 y.o. female with a reported psychiatric history of depression and anxiety who transferred to [facility] from [facility] for SVC syndrome and hypertensive emergency after missing a HD session. Psychiatry was originally consulted for SI/depression. Patient has an extensive trauma history and endorses PTSD symptoms, including nightmares. Patient also has chronic passive SI at baseline in the setting of her illness and not having a good support system. Denies active SI, HI, or AVH. Reported nephrologist was giving her hydroxyzine (takes either 25 or 50 mg) at night for anxiety. Patient initially ambivalent but later was open to medications. At this time there is not indication for psychiatric hospitalization.   Initiated Zoloft for mood symptoms; appears to be tolerating well.     Psychiatric Diagnoses: PTSD, MDD, unspecified anxiety, TUD, CUD  Pertinent Medical Diagnoses: ESRDsecondary to hypertensive nephropathy(MWF dialysis), chronic CHF, chronic atypical chest pain, GERD, tobacco use disorder, andHTN    Safety Screening:  Risk Factors Include: irritability, prior trauma, substance misuse and physical symptom burden. Patient does not have guns in the home.  Protective Factors Include: no prior suicide attempts, no prior self-injury, demonstration of help-seeking behavior, religious/spiritual beliefs and no active thoughts of suicide.  Summary: At this time, the patient does not pose an acute risk above baseline. Protective factors include: see above      PLAN:    - Please place psychiatry consult order if not already done.  - Precautions: LOW RISK precautions (contract for safety, no need for 1:1)  - Medications               - recommended continuing Zoloft 25 mg daily x3 days  (started 6/21) for now then 50 mg daily if tolerating 25mg  (with ultimate target of 100 mg daily). Recommend discharging with Zoloft 50 mg daily if tolerating.                - patient already on Cardura 4mg  QPM (home med for HTN) which can also help with nightmares  - recommend repeat EKG to trend QTC (or continuous tele); QTC on 6/20 was 480  - on NRT  - patient does not desire therapy or chaplain at this time  - Disposition: No indication for psychiatric hospitalization at this time. OP psych follow up arranged for 7/5 at [facility] (see note 6/23). Will continue to follow.         SUBJECTIVE:   Reported she wants to leave. Denies SI/HI/AVH. Tolerating Zoloft, denies SE. Informed her that we will arrange OP follow up close to home.       ROS: 12 point ROS completed.  Constitutional: Negative except for as documented in HPI  Eyes: Negative  Ears/nose/mouth/throat: Negative  Resp: Negative  Cardio: Negative except for as documented in HPI  GI : Negative  GU : Negative  Skin/breast: Negative  Heme/lymph: Negative  Musculoskeletal: Negative except for as documented in HPI  Neuro: Negative except for as documented in HPI  Psychiatric: See HPI.  Endocrine: Negative  Allergy/immuno: Negative      Current Medications: Reviewed in Epic.  Allergies:   Allergies   Allergen Reactions   . Lisinopril Swelling     Tongue and throat swelling  PHYSICAL EXAM:  Constitutional: BP (!) 152/101   Pulse 82   Temp 36.5 C (97.7 F)   Resp (!) 9   Wt 88.2 kg (194 lb 7.1 oz)   SpO2 97%   BMI 30.45 kg/m       Head: Atraumatic, normocephalic   Eyes: Pupils equal, round. EOM grossly intact. No nystagmus. Conjunctiva clear.  Respiratory: No increased work of breathing. No use of accessory muscles.  Cardiovascular: No swelling/edema of exposed extremities. HD port visible and intact.   Musculoskeletal: No gross deformity   Neuro: No abnormal movements noted. No tremor.  Psych: As above in MSE  Skin:  Dry. No diaphoresis or flushing.     Mental Status Exam:  Appearance: hospital gown and no apparent distress  Behavior: calm and cooperative  Gait/Station: reclined in bed  Musculoskeletal: No psychomotor agitation or retardation noted  Speech: quiet  Mood: "I want to go home"  Affect: irritable  Thought Process: linear  Associations:  no loosening of associations  Thought Content: no active thoughts of SI  Perceptual Disturbances: does not appear to be responding to internal stimuli  Attention/Concentration: grossly intact  Orientation: grossly oriented  Memory: recent and remote memory intact per interview  Language: no word-finding issues  Insight: fair  Judgment: fair        PERTINENT STUDIES/TESTING:    UDS : not performed  EtOH: not performed  EKG:  QTC on 6/20 was 480  Pertinent imaging: CXR 6/20   IMPRESSION:  1.Interval replacement of the right IJ dialysis catheter and stenting of the SVC.  2.Worsening lung aeration with increased right mid and lower lung zone patchy consolidations concerning for infectious process.    Ruben Reason MD6/23/2023 12:32  PGY-1 Psychiatry

## 2021-08-16 NOTE — Care Plan (Signed)
Pt rested through the night on c-pap in between care. BP against parameter throughout shift. Medicated with hydralazine per PRN order.

## 2021-08-16 NOTE — Care Plan (Signed)
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 Pt independent with bed mobility and transfers. Supervision while walking as pt states her mobility changes "depending on how I feel". Pt is at her functional baseline. No acute PT needs.    Discharge Needs:    Equipment Recommendation: none anticipated           Discharge Disposition: home    JUSTIFICATION OF DISCHARGE RECOMMENDATION   Based on current diagnosis, functional performance prior to admission, and current functional performance, this patient requires continued PT services in home in order to achieve significant functional improvements in these deficit areas:  .        Plan:   Current Intervention:  (d/c PT)  To provide physical therapy services    for duration of evaluation only.    The risks/benefits of therapy have been discussed with the patient/caregiver and he/she is in agreement with the established plan of care.       Subjective & Objective         08/16/21 0800   Therapist Pager   PT Assigned/ Pager # Josy Peaden [phone]   Rehab Session   Document Type evaluation   Total PT Minutes: 8   Patient Effort good   Symptoms Noted During/After Treatment none   General Information   Patient Profile Reviewed yes   Onset of Illness/Injury or Date of Surgery 08/12/21   Pertinent History of Current Functional Problem 46 yo female admitted with SVC Syndrome secondary to near complete occlusive thrombus s/p SVC stent and tunnel dialysis catheter replacement on 08/13/2021   Medical Lines PIV Line  (dialysis catheter)   Respiratory Status room air   Existing Precautions/Restrictions no known precautions/limitations   Mutuality/Individual Preferences   Individualized Care Needs  up ad lib   Living Environment   Lives With alone   Living Arrangements apartment   Home Assessment: No Problems Identified   Home Accessibility no concerns   Functional Level Prior   Ambulation 0 - independent   Transferring 0 - independent   Toileting 0 - independent   Bathing 0 - independent   Dressing 0 - independent   Eating 0 - independent   Communication 0 - understands/communicates without difficulty   Self-Care   Equipment Currently Used at Home yes   Activity/Exercise/Self-Care Comment sometimes uses a cane or walker "depending on how I feel"   Pre Treatment Status   Pre Treatment Patient Status Patient supine in bed   Support Present Pre Treatment  None   Communication Pre Treatment  Nurse   Cognitive Assessment/Interventions   Behavior/Mood Observations behavior appropriate to situation, WNL/WFL   Attention WNL/WFL   Follows Commands WFL   Vital Signs   O2 Delivery Pre Treatment room air   O2 Delivery Post Treatment room air   Pain Assessment   Pre/Posttreatment Pain Comment no c/o pain   RUE Assessment   RUE Assessment WFL- Within Functional Limits   LUE Assessment   LUE Assessment WFL- Within Functional Limits   RLE Assessment   RLE Assessment WFL- Within Functional Limits   LLE Assessment   LLE Assessment WFL- Within Functional Limits  Trunk Assessment   Trunk Assessment WFL-Within Functional Limits   Bed Mobility Assessment/Treatment   Supine-Sit Independence independent   Sit to Supine, Independence independent   Transfer Assessment/Treatment   Sit-Stand Independence independent   Stand-Sit Independence independent   Gait Assessment/Treatment   Independence  supervision required   Distance in Feet 300   Balance Skill Training   Sitting Balance: Static good balance   Sitting, Dynamic (Balance) good balance   Sit-to-Stand Balance good balance   Standing Balance: Static good balance   Standing Balance: Dynamic fair + balance   Post Treatment Status   Post Treatment Patient Status Patient supine in  bed;Call light within reach   Support Present Post Treatment  None   Plan of Care Review   Plan Of Care Reviewed With patient   Basic Mobility Am-PAC/6Clicks Score (APPROVED Staff)   Turning in bed without bedrails 4   Lying on back to sitting on edge of flat bed 4   Moving to and from a bed to a chair 4   Standing up from chair 4   Walk in room 4   Climbing 3-5 steps with railing 4   6 Clicks Raw Score total 24   Standardized (t-scale) score 57.68   Exercise/Activity Level Performed 8- Walked 250 feet or more   Physical Therapy Clinical Impression   Assessment 46 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 Pt independent with bed mobility and transfers. Supervision while walking as pt states her mobility changes "depending on how I feel". Pt is at her functional baseline. No acute PT needs.   Criteria for Skilled Therapeutic no   Predicted Duration of Therapy Intervention (days/wks) evaluation only   Anticipated Equipment Needs at Discharge (PT) none anticipated   Anticipated Discharge Disposition home   Evaluation Complexity Justification   Patient History: Co-morbidity/factors that impact Plan of Care 1-2 that impact Plan of Care   Examination Components 4 or more Exam elements addressed   Presentation Stable: Uncomplicated, straight-forward, problem focused   Clinical Decision Making Low complexity   Evaluation Complexity Low complexity   Planned Therapy Interventions, PT Eval   Planned Therapy Interventions (PT)   (d/c PT)       Therapist:   Moshe Salisbury, PT   Pager #:  [phone]

## 2021-08-16 NOTE — Consults (Signed)
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    Important drug interactions:      Allergies:   Allergies   Allergen Reactions   . Lisinopril Swelling     Tongue and throat swelling            Labs monitoring plan/orders  Labs and Frequency Additional Comments   CBC/diff, Hepatic function panel, Inflammatory CRP, and pre-dialysis vancomycin level Every Monday   BUN and Creatinine at discretion of outpatient nephrologist        [facility] Pharmacist:  Dr. Madolyn Frieze  Phone Number: [phone] Ext: [phone]  Fax Labs To: [phone]     Physician Monitoring OPAT Course:   Dr. Matilde Sprang  Phone Number: [phone]  Fax Labs To: [phone]       ID follow-up:           Attending Physician   Dr. Humphrey Rolls       If there are any changes to antibiotic plan or new culture results after the OPAT note has been placed, please contact us so we can update the OPAT note.      Nils Pyle, PharmD  Infectious Diseases Clinical Pharmacist  [facility]/COpAT/Antimicrobial Stewardship  [phone]

## 2021-08-16 NOTE — Care Plan (Signed)
[place]  Occupational Therapy Initial Evaluation and Discharge    Patient Name: [name]  Date of Birth: [birth year]  Height:    Weight: Weight: 88.2 kg (194 lb 7.1 oz)  Room/Bed: 01/A  Payor: HU[employer] MEDICARE / Plan: HU[employer] CHOICE PPO / Product Type: PPO /     Assessment:   Ms Carbon tolerated this OT evaluation well. Pt demo'd independence with functional transfers/mobility and ADL completion. No concerns noted from functional standpoint, pt verbalized no concerns re: return home. Based on current performance, pt safe to return home with assist once medically stable from OT standpoint. No additional skilled OT services warranted while in the acute setting, will sign off. Please re-consult if medical status changes.      Discharge Needs:   Equipment Recommendation: none anticipated      Discharge Disposition: home with assist    JUSTIFICATION OF DISCHARGE RECOMMENDATION   Based on current diagnosis, functional performance prior to admission, and current functional performance, this patient requires continued OT services in home with assist  in order to achieve significant functional improvements.    Plan:   Current Intervention:      To provide Occupational therapy services Evaluation Only, evaluation only.       The risks/benefits of therapy have been discussed with the patient/caregiver and he/she is in agreement with the established plan of care.       Subjective & Objective        08/16/21 0759   Therapist Pager   OT Assigned/ Pager # Aireal Slater/Marley [phone]   Rehab Session   Document Type evaluation   Total OT Minutes: 8   Patient Effort good   Symptoms Noted During/After Treatment none   General Information   Patient Profile Reviewed yes   Onset of Illness/Injury or Date of Surgery 08/12/21   Pertinent History of Current Functional Problem 46 y/o female PMH of HD dependant ESRD, HTN, CHF, and OSA who presented to outside facility with chest pain. Patient has also missed  one HD session due to transportation issues. Patient had CT angio completed that revealed near complete occlusive thrombus of distal SVC adjacent to dialysis catheter. Patient was started on heparin drip and was then accepted to [facility]. Patient also with hypertensive emergency and started on nicardipine drip and given IV hydralazine. Patient transitioned to oral antihypertensives after blood pressure improved. IR consulted and patient was taken for thrombectomy and venoplasty of SVC with stent placement and TCC exchange. Patient had 1/4 positive blood culture on 6/18 for coag negative staph and was started on vancomycin. Repeat blood cultures obtained 6/20 are pending. Pain management consulted on 6/21 for chronic back pain.   Medical Lines PIV Line;Telemetry   Respiratory Status room air   Existing Precautions/Restrictions full code   Pre Treatment Status   Pre Treatment Patient Status Patient sitting on edge of bed;Nurse approved session;Call light within reach;Telephone within reach   Support Present Pre Treatment  None   Communication Pre Treatment  Nurse   Mutuality/Individual Preferences   Individualized Care Needs up ad lib   Patient-Specific Goals (Include Timeframe) to go home   Living Environment   Lives With alone   Living Arrangements apartment   Home Assessment: No Problems Identified   Home Accessibility bed and bath on same level   Transportation Available Medicaid transportation;family or friend will provide   Functional Level Prior   Ambulation 0 - independent   Transferring 0 - independent  Toileting 0 - independent   Bathing 0 - independent   Dressing 0 - independent   Eating 0 - independent   Self-Care   Usual Activity Tolerance good   Current Activity Tolerance good   Equipment Currently Used at Home yes   Equipment Currently Used at Home cane, straight;walker, rolling;shower chair  (only uses when feeling weak)   Vital Signs   Vitals Comment no s/s of distress   Pain Assessment   Additional  Documentation Pain Scale: Numbers Pre/Post-Treatment (Group)   Pain Assessment   Pre/Posttreatment Pain Comment no c/o pain   Coping/Psychosocial   Observed Emotional State cooperative;calm   Verbalized Emotional State acceptance   Cognitive Assessment/Interventions   Behavior/Mood Observations behavior appropriate to situation, WNL/WFL   Orientation Status oriented x 4   Attention WNL/WFL   Follows Commands WNL   RUE Assessment   RUE Assessment WFL- Within Functional Limits   LUE Assessment   LUE Assessment WFL- Within Functional Limits   Mobility Assessment/Training   Mobility Comment Engaged pt in greater than household distance of functional mobility unsupported with SUP. No LOB or concerns noted.   Bed Mobility Assessment/Treatment   Supine-Sit Independence independent   Sit to Supine, Independence independent   Transfer Assessment/Treatment   Sit-Stand Independence independent   Stand-Sit Independence independent   Lower Body Dressing Assessment/Training   Position sitting   DRESSING ASSESSED Don Socks   Independence Level  independent   Balance Skill Training   Sitting Balance: Static good balance   Sitting, Dynamic (Balance) good balance   Sit-to-Stand Balance good balance   Standing Balance: Static good balance   Standing Balance: Dynamic fair + balance   Post Treatment Status   Post Treatment Patient Status Patient supine in bed;Call light within reach;Telephone within reach  (sitting in bed)   Support Present Post Treatment  None   Functional Impairment   Overall Functional Impairments/Problem List balance impaired;endurance  (mild)   Clinical Impression   Functional Level at Time of Session Ms Kubicek tolerated this OT evaluation well. Pt demo'd independence with functional transfers/mobility and ADL completion. No concerns noted from functional standpoint, pt verbalized no concerns re: return home. Based on current performance, pt safe to return home with assist once medically stable from OT standpoint.  No additional skilled OT services warranted while in the acute setting, will sign off. Please re-consult if medical status changes.   Criteria for Skilled Therapeutic Interventions Met (OT) no;no problems identified which require skilled intervention   Therapy Frequency Evaluation Only   Predicted Duration of Therapy evaluation only   Anticipated Equipment Needs at Discharge none anticipated   Anticipated Discharge Disposition home with assist   Highest level of Mobility score   Exercise/Activity Level Performed 8- Walked 250 feet or more   Evaluation Complexity Justification   Occupational Profile Review Expanded review   Performance Deficits 1-3 deficits;Balance;Endurance   Clinical Decision Making Low analytic complexity   Evaluation Complexity Low       Therapist:   Gaetano Hawthorne, OT   Pager #: [phone]

## 2021-08-17 DIAGNOSIS — M549 Dorsalgia, unspecified: Secondary | ICD-10-CM

## 2021-08-17 DIAGNOSIS — G8929 Other chronic pain: Secondary | ICD-10-CM

## 2021-08-17 DIAGNOSIS — I5032 Chronic diastolic (congestive) heart failure: Secondary | ICD-10-CM

## 2021-08-17 DIAGNOSIS — Z720 Tobacco use: Secondary | ICD-10-CM

## 2021-08-17 DIAGNOSIS — J449 Chronic obstructive pulmonary disease, unspecified: Secondary | ICD-10-CM

## 2021-08-17 DIAGNOSIS — I132 Hypertensive heart and chronic kidney disease with heart failure and with stage 5 chronic kidney disease, or end stage renal disease: Secondary | ICD-10-CM

## 2021-08-17 LAB — CBC WITH DIFF
BASOPHIL #: <0.1 10*3/uL (ref <=0.20)
BASOPHIL %: 1 %
EOSINOPHIL #: 0.4 10*3/uL (ref <=0.50)
EOSINOPHIL %: 5 %
HCT: 24.3 % — ABNORMAL LOW (ref 34.8–46.0)
HGB: 7.6 g/dL — ABNORMAL LOW (ref 11.5–16.0)
IMMATURE GRANULOCYTE #: <0.1 10*3/uL (ref <0.10)
IMMATURE GRANULOCYTE %: 1 % (ref 0–1)
LYMPHOCYTE #: 1.4 10*3/uL (ref 1.00–4.80)
LYMPHOCYTE %: 17 %
MCH: 29.8 pg (ref 26.0–32.0)
MCHC: 31.3 g/dL (ref 31.0–35.5)
MCV: 95.3 fL (ref 78.0–100.0)
MONOCYTE #: 1.07 10*3/uL (ref 0.20–1.10)
MONOCYTE %: 13 %
MPV: 9.1 fL (ref 8.7–12.5)
NEUTROPHIL #: 5.53 10*3/uL (ref 1.50–7.70)
NEUTROPHIL %: 63 %
PLATELETS: 235 10*3/uL (ref 150–400)
RBC: 2.55 10*6/uL — ABNORMAL LOW (ref 3.85–5.22)
RDW-CV: 17.9 % — ABNORMAL HIGH (ref 11.5–15.5)
WBC: 8.5 10*3/uL (ref 3.7–11.0)

## 2021-08-17 LAB — ADULT ROUTINE BLOOD CULTURE, SET OF 2 BOTTLES (BACTERIA AND YEAST)
BLOOD CULTURE, ROUTINE: ABNORMAL — CR
BLOOD CULTURE, ROUTINE: ABNORMAL — CR

## 2021-08-17 LAB — BASIC METABOLIC PANEL
ANION GAP: 9 mmol/L (ref 4–13)
BUN/CREA RATIO: 3 — ABNORMAL LOW (ref 6–22)
BUN: 17 mg/dL (ref 8–25)
CALCIUM: 9.4 mg/dL (ref 8.5–10.0)
CHLORIDE: 99 mmol/L (ref 96–111)
CO2 TOTAL: 23 mmol/L (ref 22–30)
CREATININE: 5.4 mg/dL — ABNORMAL HIGH (ref 0.60–1.05)
ESTIMATED GFR: 9 mL/min/BSA — ABNORMAL LOW (ref >=60)
GLUCOSE: 80 mg/dL (ref 65–125)
POTASSIUM: 4.2 mmol/L (ref 3.5–5.1)
SODIUM: 131 mmol/L — ABNORMAL LOW (ref 136–145)

## 2021-08-17 LAB — VANCOMYCIN, RANDOM: VANCOMYCIN RANDOM: 23.5 ug/mL — ABNORMAL LOW (ref 30.0–40.0)

## 2021-08-17 LAB — MAGNESIUM: MAGNESIUM: 1.9 mg/dL (ref 1.8–2.6)

## 2021-08-17 LAB — TROPONIN-I: TROPONIN I: 92 ng/L — ABNORMAL HIGH (ref 0–30)

## 2021-08-17 LAB — PHOSPHORUS: PHOSPHORUS: 4.5 mg/dL (ref 2.4–4.7)

## 2021-08-17 MED ORDER — HYDRALAZINE 20 MG/ML INJECTION SOLUTION
5.0000 mg | Freq: Four times a day (QID) | INTRAMUSCULAR | Status: DC | PRN
Start: 2021-08-17 — End: 2021-08-19
  Administered 2021-08-18: 5 mg via INTRAVENOUS
  Administered 2021-08-18: 0 mg via INTRAVENOUS
  Administered 2021-08-18 – 2021-08-19 (×2): 5 mg via INTRAVENOUS
  Administered 2021-08-19: 0 mg via INTRAVENOUS
  Filled 2021-08-17 (×5): qty 1

## 2021-08-17 MED ORDER — HYDROMORPHONE 1 MG/ML INJECTION WRAPPER
0.3000 mg | INJECTION | INTRAMUSCULAR | Status: AC
Start: 2021-08-18 — End: 2021-08-17
  Administered 2021-08-17: 0.3 mg via INTRAVENOUS
  Filled 2021-08-17: qty 1

## 2021-08-17 MED ORDER — ISOSORBIDE MONONITRATE ER 60 MG TABLET,EXTENDED RELEASE 24 HR
120.0000 mg | ORAL_TABLET | Freq: Every morning | ORAL | Status: DC
Start: 2021-08-17 — End: 2021-08-19
  Administered 2021-08-17 – 2021-08-19 (×2): 120 mg via ORAL
  Filled 2021-08-17 (×3): qty 2

## 2021-08-17 MED ORDER — ISOSORBIDE MONONITRATE ER 60 MG TABLET,EXTENDED RELEASE 24 HR
120.0000 mg | ORAL_TABLET | Freq: Every morning | ORAL | Status: DC
Start: 2021-08-18 — End: 2021-08-17
  Filled 2021-08-17: qty 2

## 2021-08-17 MED ORDER — LORAZEPAM 2 MG/ML INJECTION WRAPPER
0.5000 mg | INTRAMUSCULAR | Status: AC
Start: 2021-08-18 — End: 2021-08-18
  Administered 2021-08-18: 0.5 mg via INTRAVENOUS
  Filled 2021-08-17: qty 1

## 2021-08-17 MED ORDER — HYDROMORPHONE 1 MG/ML INJECTION WRAPPER
INJECTION | INTRAMUSCULAR | Status: AC
Start: 2021-08-17 — End: 2021-08-17
  Administered 2021-08-17: 0.3 mg via INTRAVENOUS
  Filled 2021-08-17: qty 1

## 2021-08-17 MED ORDER — HYDROMORPHONE 1 MG/ML INJECTION WRAPPER
0.3000 mg | INJECTION | INTRAMUSCULAR | Status: AC
Start: 2021-08-17 — End: 2021-08-17

## 2021-08-17 MED ORDER — DILTIAZEM CD 180 MG CAPSULE,EXTENDED RELEASE 24 HR
180.0000 mg | ORAL_CAPSULE | Freq: Every day | ORAL | Status: DC
Start: 2021-08-18 — End: 2021-08-19
  Administered 2021-08-18 – 2021-08-19 (×2): 180 mg via ORAL
  Filled 2021-08-17 (×2): qty 1

## 2021-08-17 NOTE — Nurses Notes (Addendum)
Paged on call MD  5EOBS bed 1 [name]  Pt BP continues 170's/90's-100's after labetolol and prn hydralazine. Continue hydralazine?  [name] [phone]    MD states he will order Imdur now that was scheduled to start in the morning.

## 2021-08-17 NOTE — Nurses Notes (Addendum)
Unable to maintain SBP < 160. 2 doses of prn hydralazine given. Dr Rolley Sims Notified via text page. Pt asymptomatic.     Per Dr Rolley Sims, SBP goal < 180. Continue to give prn hydralazine Q6 hrs for SBP > 160.

## 2021-08-17 NOTE — Progress Notes (Signed)
[facility]  Medicine Progress Note  Full Code   [name]  Date of service: 08/17/2021  Date of Admission:  08/12/2021    Hospital Day:  LOS: 5 days      Subjective: NAEO. Patient has no complaints other than being upset that she has to wait for OPAT to see her and for [place] to arrange a ride for her. Informed her of plan for 6 weeks of IV abx with dialysis. She verbalized understanding of this.     Vital Signs:  Temp  Avg: 36.7 C (98.1 F)  Min: 36.5 C (97.7 F)  Max: 36.9 C (98.4 F)    Pulse  Avg: 78.3  Min: 67  Max: 95 BP  Min: 138/92  Max: 194/117   Resp  Avg: 16.6  Min: 0  Max: 37 SpO2  Avg: 98.3 %  Min: 95 %  Max: 100 %          Input/Output    Intake/Output Summary (Last 24 hours) at 08/17/2021 0950  Last data filed at 08/17/2021 0122  Gross per 24 hour   Intake 180 ml   Output 2500 ml   Net -2320 ml    I/O last shift:  No intake/output data recorded.   acetaminophen (TYLENOL) tablet, 650 mg, Oral, Q4H PRN  albuterol (PROVENTIL) 2.5 mg / 3 mL (0.083%) neb solution, 2.5 mg, Nebulization, Q4H PRN  apixaban (ELIQUIS) tablet, 10 mg, Oral, 2x/day   Followed by  Derrill Memo ON 08/22/2021] apixaban (ELIQUIS) tablet, 5 mg, Oral, 2x/day  budesonide-formoterol (SYMBICORT) 160 mcg-4.5 mcg per inhalation oral inhaler - "Respiratory to administer", 2 Puff, Inhalation, 2x/day  cinacalcet (SENSIPAR) tablet, 30 mg, Oral, QPM  cloNIDine (CATAPRES-TTS) transdermal patch 0.3 mg, 0.3 mg, Transdermal, Q7 Days  clopidogrel (PLAVIX) 75 mg tablet, 75 mg, Oral, Daily  cyclobenzaprine (FLEXERIL) tablet, 5 mg, Oral, 2x/day PRN  D5W 250 mL flush bag, , Intravenous, Q15 Min PRN  dilTIAZem (CARDIZEM CD) 24 hr extended release capsule, 120 mg, Oral, Daily  doxazosin (CARDURA) tablet, 4 mg, Oral, QPM  dronabinol (MARINOL) capsule, 2.5 mg, Oral, 3x/day  hydrALAZINE (APRESOLINE) injection 10 mg, 10 mg, Intravenous, Q1H PRN  [START ON 08/18/2021] isosorbide mononitrate (IMDUR) 24 hr extended release tablet, 120 mg, Oral, QAM  labetalol  (NORMODYNE) tablet, 200 mg, Oral, Q12H  lidocaine-menthol (LIDOPATCH) 3.6%-1.25% patch, 1 Patch, Transdermal, Daily  montelukast (SINGULAIR) 10 mg tablet, 10 mg, Oral, QPM  nicotine (NICODERM CQ) transdermal patch (mg/24 hr), 14 mg, Transdermal, Daily  NS 250 mL flush bag, , Intravenous, Q15 Min PRN  NS flush syringe, 2-6 mL, Intracatheter, Q8HRS  NS flush syringe, 2-6 mL, Intracatheter, Q1 MIN PRN  ondansetron (ZOFRAN) 2 mg/mL injection, 4 mg, Intravenous, Q8H PRN  oxyCODONE (ROXICODONE) immediate release tablet, 2.5 mg, Oral, Q4H PRN   Or  oxyCODONE (ROXICODONE) immediate release tablet, 5 mg, Oral, Q4H PRN  pantoprazole (PROTONIX) delayed release tablet, 40 mg, Oral, Daily  phenol (CHLORASEPTIC) 1.4% oromucosal spray, 1 Spray, Mouth/Throat, Q4H PRN  polyethylene glycol (MIRALAX) oral packet, 17 g, Oral, Daily  sennosides-docusate sodium (SENOKOT-S) 8.6-50mg  per tablet, 1 Tablet, Oral, 2x/day  sertraline (ZOLOFT) tablet, 50 mg, Oral, Daily  Vancomycin IV - Pharmacist to Dose per Protocol, , Does not apply, Daily PRN  Vancomycin IV Intermittent Dosing, , Does not apply, Daily PRN        Physical Exam:  Constitutional:  appears chronically ill, appears stated age and no distress. Vitals reviewed   Eyes:  Conjunctiva clear, sclera non-icteric,  pupils PERRLA, EOMI   ENT:  Mouth mucous membranes moist. , Pharynx without injection or exudate.   Neck:  supple, symmetrical, trachea midline  Respiratory:  clear but diminished  Cardiovascular:  regular rate and rhythm, S1, S2 normal, no murmur, click, rub or gallop  Gastrointestinal:  Soft, non-tender, Bowel sounds normal, Non-tender, non-distended  Musculoskeletal:  Head atraumatic and normocephalic  Integumentary:  Skin warm and dry, TCC in place   Neurologic:  Alert and oriented x3, No tremor, No asterixis  Psychiatric:  Affect Normal, Speech normal      Labs: I have reviewed all labs  CBC Results Differential Results   Recent Results (from the past 30 hour(s))   CBC  WITH DIFF    Collection Time: 08/17/21  5:34 AM   Result Value    WBC 8.5    HGB 7.6 (L)    HCT 24.3 (L)    PLATELETS 235    Recent Results (from the past 30 hour(s))   CBC WITH DIFF    Collection Time: 08/17/21  5:34 AM   Result Value    WBC 8.5    NEUTROPHIL % 63    MONOCYTE % 13    BASOPHIL % 1    BASOPHIL # <0.10      BMP Results Other Chemistries Results   Results for orders placed or performed during the hospital encounter of 08/12/21 (from the past 30 hour(s))   BASIC METABOLIC PANEL    Collection Time: 08/17/21  5:34 AM   Result Value    SODIUM 131 (L)    POTASSIUM 4.2    CHLORIDE 99    CO2 TOTAL 23    GLUCOSE 80    BUN 17    CREATININE 5.40 (H)    Recent Results (from the past 30 hour(s))   PHOSPHORUS    Collection Time: 08/17/21  5:34 AM   Result Value    PHOSPHORUS 4.5   MAGNESIUM    Collection Time: 08/17/21  5:34 AM   Result Value    MAGNESIUM 1.9      Liver/Pancreas Enzyme Results Liver Function Results   No results found for this or any previous visit (from the past 30 hour(s)). No results found for this or any previous visit (from the past 30 hour(s)).   Cardiac Results Coags Results   No results found for this or any previous visit (from the past 30 hour(s)). No results found for this or any previous visit (from the past 30 hour(s)).     Radiology: I have reviewed all imaging     PT/OT: Yes    Consults:   Consult Orders Placed This Encounter   Procedures   . IP CONSULT TO IR - VASCULAR/BODY - ORDERING PROVIDER MUST CALL/PAGE SERVICE   . IP CONSULT TO NEPHROLOGY - ORDERING PROVIDER MUST CALL/PAGE SERVICE   . IP CONSULT TO PAIN MANAGEMENT - ORDERING PROVIDER MUST CALL/PAGE SERVICE   . IP CONSULT TO PSYCHIATRY - ADULT - ORDERING PROVIDER MUST CALL/PAGE SERVICE   . IP CONSULT TO PULMONARY MEDICINE - SLEEP MEDICINE - ORDERING PROVIDER MUST CALL/PAGE SERVICE   . IP CONSULT TO DISCHARGE PHARMACY FOR PRESCRIPTION COST INQUIRY   . IP CONSULT TO INFECTIOUS DISEASES - ORDERING PROVIDER MUST CALL/PAGE SERVICE    . IP CONSULT TO OPAT (OUTPATIENT PARENTERAL ANTIMICROBIAL THERAPY PROGRAM)   . IP CONSULT TO OPAT (OUTPATIENT PARENTERAL ANTIMICROBIAL THERAPY PROGRAM)       Hardware (lines, foley's, tubes):   Patient Lines/Drains/Airways Status  Active Line / Dialysis Catheter / Dialysis Graft / Drain / Airway / Wound     Name Placement date Placement time Site Days    Peripheral IV Ultrasound guided Right Forearm 08/12/21  1725  -- 4    AV Fistula Left;Upper Arm 12/05/20  1502  -- 254    Dialysis Catheter Cuffed/Tunneled 08/13/21  1316  -- 3                Assessment/ Plan:   Active Hospital Problems    Diagnosis   . Primary Problem: SVC syndrome   . 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 and tunnel dialysis catheter replacement on 08/13/2021   - s/p IR thrombectomy and stenting and TCC replacement on 6/20  - Plan to switch to Eliquis and Plavix today 6/22    - consult to discharge pharmacy placed to obtain cost inquiry for eliquis    Hypertensive Emergency, resolved  hx of HTN  Hx of HFpEF not in acute exacerbation   - nicardipine drip discontinued    - home anti hypertensive resumed: labetalol 200 mg PO BID, cardizem 120 mg PO daily, clonidine 0.3 transdermal patch weekly, doxazosin 4 mg PO, Imdur, 60 mg PO   - per MICU note - EF 55-60% in 2020   - repeat TTE done this admission with EF 65%, indeterminate diastolic function, severe left ventricular hypertrophy     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 Patient does not want this restarted at this time  - Pt now back on regular dialysis schedule after session 6/23     Coagulase negative staph bacteremia vs possible blood culture contaminant   - blood cultures drawn at [facility] with 1/4 bottles positive for gram  negative staph   - WBC and procal WNL, CRP mildly elevated however could be secondary to thrombus   - Vanc started on arrival; will continue for now until repeat blood cultures result  - Given persistently positive cultures, ID consulted   - Plan for 6 weeks of Vanc   - OPAT consulted, awaiting recs      OSA   - sleep medicine consulted   - wears 2L NC q HS at home   - CPAP qHS ordered inpatient    Possible Suicidal Ideations   - psych following, low concern at this time  - DC SI precautions     Chronic back pain   - lidocaine patch, PRN tylenol, flexeril, oxycodone   - pain management consulted                - add 2.5 mg marinol TID    Acute on Chronic Normocytic Anemia   - no current s/s bleeding   - baseline Hgb around 9   - Hgb today 7.2   - transfuse Hgb < 7    Asthma  COPD  Tobacco Use Disorder    - wean oxygen as tolerated    - continue home Symbicort    - continue nicotine patch    - PRN albuterol nebulizers ordered    DVT/PE Prophylaxis: Apixaban    Disposition Planning: Home discharge      Dawn Crazier, APRN  Department of Emergency Medicine  Department  of [place]   Pager [phone]    I personally saw and evaluated the patient. See APP's note for additional details. I agree with the findings and plan of care as documented in the APP's note. Any exceptions/additions are edited/noted. My findings/participation are:    S: No acute events overnight. Mrs. Brun is in no distress and has no complaints.     Review of Systems - All other systems reviewed and negative except as noted in subjective/HPI.    O:   Filed Vitals:    08/17/21 1045 08/17/21 1058 08/17/21 1445 08/17/21 1447   BP: (!) 152/92  (!) 155/104    Pulse: 71  74    Resp: 13 16  16    Temp:  36.6 C (97.8 F)  36.7 C (98.1 F)   SpO2: 100%  97%          A/P:   Awaiting OPAT consult and arranging transportation for the patient to go home.     Derenda Mis, MD

## 2021-08-17 NOTE — Nurses Notes (Signed)
Pt c/o chest pain 6/10. Dr Rolley Sims notified. Orders placed.

## 2021-08-17 NOTE — Pharmacy (Signed)
[facility] [facility] / [facility] of Pharm[facility] Services  Therapeutic Drug Monitoring: Vancomycin  08/17/2021      Patient name: [name], [name]  Date of Birth:  [birth year]    Actual Weight:  Weight: 86.8 kg (191 lb 5.8 oz) (08/17/21 0541)     BMI:         Date RPh Current regimen (including mg/kg) Indication &  Organism AUC or trough based dosing Target Levels^ SCr (mg/dL) CrCl* (mL/min) Measured level(s)   (mcg/mL) Calculated AUC (if AUC based monitoring) Plan & predicted AUC/trough if initial dosing (including when levels are due) Comments   6/20 NLKS Vancomycin to start Empiric, GPC on blood culture 6/18 trough  11.5 ESRD  N/A - Load vancomycin 20 mg/kg post iHD today   - No further vancomycin doses until after repeat iHD session  - f/u blood culture from 6/18 and repeat from 6/20   6/22 PT Intermittent    S/p Vanc 1500mg  6/20 @ 1720 Empiric- Bacteremia Intermittent  Trough 20-25   (pre-HD) 9.5  (HD) 9.4  (HD)   - Pt getting HD today, give Vanc 1000 mg (15 mg/kg) x 1 dose in HD. Obtain pre-HD lvl prior to 3rd HD session (next session).  - update: 6/20 blood cultures: GPCs (confirmed)   6/23 NLD Intermittent - last dose 1 g on 6/22 @ 1102     CoNS bacteremia Trough 20-25   (pre-HD) ESRD ESRD 15.3   (pre-HD level ~23 hrs after last dose) N/A  - Give 1500 mg with HD today    - Follow RRT plans for redosing and level needs     6/24 PT Intermittent  last dose 1500mg   on 6/23 @ 1356 CoNS bacteremia Trough 20-25  (Pre-HD) 5.4  (HD) 14.7  (HD) 23.5  (~28 hrs post-dose) N/A - Vanc random lvl WNL- continue Vanc 1500 mg with HD (not yet ordered)  - Follow RRT plans for re-dosing, obtain repeat Pre-HD lvl in 1 week                                                  ^Target levels depends on dosing and monitoring method, AUC vs. trough based. For AUC based dosing units are mg*h/L. For trough based dosing units are mcg/mL.     *Creatinine clearance is estimated by using the Cockcroft-Gault equation for adult patients and the Carol Ada for pediatric patients.    The decision to discontinue vancomycin therapy will be determined by the primary service.  Please contact the pharmacist with any questions regarding this patient's medication regimen.

## 2021-08-17 NOTE — Nurses Notes (Signed)
Pt continues to have chest pain 10/10. Crying in bed. Nauseated. Zofran given. Toponin 92. ECG completed. BP 174/107. HR 88.  Dr Rolley Sims notified via text page.     0.3mg  dilaudid and 0.5mg  ativan ordered. Dr Rolley Sims to assess pt at bedside.

## 2021-08-17 NOTE — Care Plan (Signed)
Continue Abx with dialysis. Pt very frustrated that she has not been discharged and abx have not been approved. Per case manager, pt can tentatively be discharged tomorrow at 1400 if abx are approved by insurance.    Problem: Adult Inpatient Plan of Care  Goal: Plan of Care Review  Outcome: Ongoing (see interventions/notes)  Goal: Patient-Specific Goal (Individualized)  Outcome: Ongoing (see interventions/notes)  Goal: Absence of Hospital-Acquired Illness or Injury  Outcome: Ongoing (see interventions/notes)  Intervention: Prevent Skin Injury  Recent Flowsheet Documentation  Taken 08/17/2021 0825 by Cory Roughen, RN  Body Position: sitting  Skin Protection: adhesive use limited  Goal: Optimal Comfort and Wellbeing  Outcome: Ongoing (see interventions/notes)  Intervention: Provide Person-Centered Care  Recent Flowsheet Documentation  Taken 08/17/2021 0825 by Cory Roughen, RN  Trust Relationship/Rapport:   care explained   questions answered  Goal: Rounds/Family Conference  Outcome: Ongoing (see interventions/notes)     Problem: Fall Injury Risk  Goal: Absence of Fall and Fall-Related Injury  Outcome: Ongoing (see interventions/notes)     Problem: Hemodialysis  Goal: Safe, Effective Therapy Delivery  Outcome: Ongoing (see interventions/notes)  Goal: Effective Tissue Perfusion  Outcome: Ongoing (see interventions/notes)  Goal: Absence of Infection Signs and Symptoms  Outcome: Ongoing (see interventions/notes)     Problem: Chronic Kidney Disease  Goal: Optimal Coping with Chronic Illness  Outcome: Ongoing (see interventions/notes)  Goal: Electrolyte Balance  Outcome: Ongoing (see interventions/notes)  Goal: Fluid Balance  Outcome: Ongoing (see interventions/notes)  Intervention: Monitor and Manage Hypervolemia  Recent Flowsheet Documentation  Taken 08/17/2021 0825 by Cory Roughen, RN  Skin Protection: adhesive use limited  Goal: Optimal Functional Ability  Outcome: Ongoing (see interventions/notes)  Intervention: Optimize  Functional Ability  Recent Flowsheet Documentation  Taken 08/17/2021 1015 by Cory Roughen, RN  Activity Management: up ad lib  Taken 08/17/2021 0825 by Cory Roughen, RN  Activity Management: activity adjusted per tolerance  Goal: Absence of Anemia Signs and Symptoms  Outcome: Ongoing (see interventions/notes)  Goal: Optimal Oral Intake  Outcome: Ongoing (see interventions/notes)  Goal: Acceptable Pain Control  Outcome: Ongoing (see interventions/notes)  Goal: Minimize Renal Failure Effects  Outcome: Ongoing (see interventions/notes)

## 2021-08-18 ENCOUNTER — Inpatient Hospital Stay (HOSPITAL_COMMUNITY): Payer: Commercial Managed Care - PPO

## 2021-08-18 DIAGNOSIS — Z95828 Presence of other vascular implants and grafts: Secondary | ICD-10-CM

## 2021-08-18 DIAGNOSIS — R918 Other nonspecific abnormal finding of lung field: Secondary | ICD-10-CM

## 2021-08-18 DIAGNOSIS — R000 Tachycardia, unspecified: Secondary | ICD-10-CM

## 2021-08-18 DIAGNOSIS — R079 Chest pain, unspecified: Secondary | ICD-10-CM

## 2021-08-18 DIAGNOSIS — E871 Hypo-osmolality and hyponatremia: Secondary | ICD-10-CM

## 2021-08-18 DIAGNOSIS — D72829 Elevated white blood cell count, unspecified: Secondary | ICD-10-CM

## 2021-08-18 DIAGNOSIS — E8770 Fluid overload, unspecified: Secondary | ICD-10-CM

## 2021-08-18 DIAGNOSIS — I160 Hypertensive urgency: Secondary | ICD-10-CM

## 2021-08-18 DIAGNOSIS — I440 Atrioventricular block, first degree: Secondary | ICD-10-CM

## 2021-08-18 LAB — CBC WITH DIFF
BASOPHIL #: <0.1 10*3/uL (ref <=0.20)
BASOPHIL %: 0 %
EOSINOPHIL #: 0.15 10*3/uL (ref <=0.50)
EOSINOPHIL %: 1 %
HCT: 23.2 % — ABNORMAL LOW (ref 34.8–46.0)
HGB: 7.4 g/dL — ABNORMAL LOW (ref 11.5–16.0)
IMMATURE GRANULOCYTE #: <0.1 10*3/uL (ref <0.10)
IMMATURE GRANULOCYTE %: 1 % (ref 0–1)
LYMPHOCYTE #: 0.81 10*3/uL — ABNORMAL LOW (ref 1.00–4.80)
LYMPHOCYTE %: 7 %
MCH: 29.5 pg (ref 26.0–32.0)
MCHC: 31.9 g/dL (ref 31.0–35.5)
MCV: 92.4 fL (ref 78.0–100.0)
MONOCYTE #: 0.96 10*3/uL (ref 0.20–1.10)
MONOCYTE %: 8 %
MPV: 9.3 fL (ref 8.7–12.5)
NEUTROPHIL #: 10.45 10*3/uL — ABNORMAL HIGH (ref 1.50–7.70)
NEUTROPHIL %: 83 %
PLATELETS: 255 10*3/uL (ref 150–400)
RBC: 2.51 10*6/uL — ABNORMAL LOW (ref 3.85–5.22)
RDW-CV: 17.9 % — ABNORMAL HIGH (ref 11.5–15.5)
WBC: 12.5 10*3/uL — ABNORMAL HIGH (ref 3.7–11.0)

## 2021-08-18 LAB — BASIC METABOLIC PANEL
ANION GAP: 11 mmol/L (ref 4–13)
BUN/CREA RATIO: 4 — ABNORMAL LOW (ref 6–22)
BUN: 34 mg/dL — ABNORMAL HIGH (ref 8–25)
CALCIUM: 9.5 mg/dL (ref 8.5–10.0)
CHLORIDE: 98 mmol/L (ref 96–111)
CO2 TOTAL: 24 mmol/L (ref 22–30)
CREATININE: 7.83 mg/dL — ABNORMAL HIGH (ref 0.60–1.05)
ESTIMATED GFR: 6 mL/min/BSA — ABNORMAL LOW (ref >=60)
GLUCOSE: 108 mg/dL (ref 65–125)
POTASSIUM: 5.3 mmol/L — ABNORMAL HIGH (ref 3.5–5.1)
SODIUM: 133 mmol/L — ABNORMAL LOW (ref 136–145)

## 2021-08-18 LAB — ECG 12-LEAD
Atrial Rate: 111 {beats}/min
Atrial Rate: 84 {beats}/min
Calculated P Axis: 72 degrees
Calculated P Axis: 74 degrees
Calculated R Axis: 41 degrees
Calculated R Axis: 30 degrees
Calculated T Axis: 125 degrees
Calculated T Axis: 87 degrees
PR Interval: 188 ms
PR Interval: 222 ms
QRS Duration: 108 ms
QRS Duration: 104 ms
QT Interval: 342 ms
QT Interval: 430 ms
QTC Calculation: 465 ms
QTC Calculation: 508 ms
Ventricular rate: 111 {beats}/min
Ventricular rate: 84 {beats}/min

## 2021-08-18 LAB — ADULT ROUTINE BLOOD CULTURE, SET OF 2 BOTTLES (BACTERIA AND YEAST): BLOOD CULTURE, ROUTINE: NO GROWTH

## 2021-08-18 LAB — TROPONIN-I: TROPONIN I: 101 ng/L — ABNORMAL HIGH (ref 0–30)

## 2021-08-18 LAB — PHOSPHORUS: PHOSPHORUS: 5.9 mg/dL — ABNORMAL HIGH (ref 2.4–4.7)

## 2021-08-18 LAB — MAGNESIUM: MAGNESIUM: 2 mg/dL (ref 1.8–2.6)

## 2021-08-18 MED ORDER — VANCOMYCIN 10 GRAM INTRAVENOUS SOLUTION
1500.0000 mg | INTRAVENOUS | Status: DC
Start: 2021-08-19 — End: 2021-08-18

## 2021-08-18 MED ORDER — SODIUM CITRATE 4 % (3 ML) INTRA-CATHETER INJECTION SYRINGE
2.0000 | INJECTION | Status: DC
Start: 2021-08-18 — End: 2021-08-19
  Filled 2021-08-18 (×2): qty 6

## 2021-08-18 MED ORDER — VANCOMYCIN 10 GRAM INTRAVENOUS SOLUTION
1500.0000 mg | Freq: Once | INTRAVENOUS | Status: DC
Start: 2021-08-18 — End: 2021-08-18
  Filled 2021-08-18: qty 15

## 2021-08-18 MED ORDER — PROMETHAZINE 25 MG TABLET
25.0000 mg | ORAL_TABLET | Freq: Once | ORAL | Status: AC
Start: 2021-08-18 — End: 2021-08-18
  Administered 2021-08-18: 25 mg via ORAL
  Filled 2021-08-18: qty 1

## 2021-08-18 MED ORDER — LABETALOL 200 MG TABLET
300.0000 mg | ORAL_TABLET | Freq: Two times a day (BID) | ORAL | Status: DC
Start: 2021-08-18 — End: 2021-08-19
  Administered 2021-08-18 – 2021-08-19 (×2): 300 mg via ORAL
  Filled 2021-08-18 (×4): qty 1

## 2021-08-18 MED ORDER — PREMIXED HEMODIALYSATE
Status: DC
Start: 2021-08-18 — End: 2021-08-19

## 2021-08-18 MED ORDER — HYDROMORPHONE 1 MG/ML INJECTION WRAPPER
0.2000 mg | INJECTION | Freq: Once | INTRAMUSCULAR | Status: DC | PRN
Start: 2021-08-18 — End: 2021-08-19

## 2021-08-18 MED ORDER — PREMIXED HEMODIALYSATE
INJECTION | INTRAVENOUS | Status: AC
Start: 2021-08-19 — End: 2021-08-19

## 2021-08-18 MED ORDER — SODIUM CHLORIDE 0.9 % INTRAVENOUS SOLUTION
5.0000 mg/h | INTRAVENOUS | Status: DC
Start: 2021-08-18 — End: 2021-08-19
  Administered 2021-08-18: 0 mg/h via INTRAVENOUS
  Administered 2021-08-18: 5 mg/h via INTRAVENOUS
  Administered 2021-08-18: 2.5 mg/h via INTRAVENOUS
  Filled 2021-08-18: qty 20

## 2021-08-18 MED ORDER — SODIUM CITRATE 4 % (3 ML) INTRA-CATHETER INJECTION SYRINGE
2.0000 | INJECTION | Status: AC
Start: 2021-08-19 — End: 2021-08-19
  Administered 2021-08-19: 2
  Filled 2021-08-18: qty 6

## 2021-08-18 NOTE — Consults (Signed)
Dialysis Services follow-up:  Date of service: 08/18/2021  Hospital Day:  LOS: 6 days     Assessment: 46 year old female with PMH of ESRD (history of non-compliance), chronic CHF, chronic atypical chest pain, GERD, tobacco use,asthma, COPD and uncontrolled HTN. Presents as a transfer from [place] where she presented w/ shortness of breath, wheezing  and diffuse volume overload after missing hemodialysis due to transportation issues. 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 Last HD was 6/19 at outside facility prior to transfer. Nephrology consulted for dialysis management.      ESRD on HD:  Dialyzes MWF at [facility] via [facility]  HD orders: 4hrs, F180 dialyzer, BFR 417mL/min, DFR 876mL/min, 2K/2.5Ca bath, EDW 82kg  ~Gets heparin bolus 5000 units at start of tx and 1900 units mid treatment  ~Hx SVC syndrome in the past as well  ~Started in 2010, etiology- hypertensive nephrosclerosis      Lytes: mild hyperkalemia (5.3), mild hyponatremia (133)  A/B: Bicarb 24, stable  CKD MB:Ca, mag, phos at goal  Sensipar 30 mg daily  Anemia: Hgb 7.4  Mircera 150 mcg q2wks (unknown last dose)  S/p 2 units PRBC this admission  Given Ferrlecit 6/22  ID  BCx: 6/22 pending  UCx: none  Antibiotics: azithromycin (6/18), ceftriaxone (6/18)  Cardiology: EF 65%, LVSF normal, RVSF cannot be assessed, RVSP indeterminate  Volume status  Appears euvolemic  UO: not quantified but still makes small amt of urine  06/24 0700 - 06/25 0659  In: 650 [P.O.:650]  Out: -   Labs  Recent Labs     08/16/21  0643 08/17/21  0534 08/18/21  0702   SODIUM 134* 131* 133*   POTASSIUM 4.5 4.2 5.3*   CHLORIDE 99 99 98   CO2 26 23 24    BUN 23 17 34*   CREATININE 6.82* 5.40* 7.83*   GFR 7* 9* 6*   ANIONGAP 9 9 11      Recent Labs     08/16/21  0643 08/17/21  0534 08/18/21  0702   CO2 26 23 24      Recent Labs     08/16/21  0643 08/17/21  0534 08/18/21  0702   CALCIUM 9.3 9.4 9.5   MAGNESIUM 1.9 1.9 2.0   PHOSPHORUS 5.2* 4.5 5.9*     No  results found for: Tharon Aquas  PTH   Date Value Ref Range Status   08/06/2021 249.5 (H) 12.0 - 88.0 pg/mL Final     Recent Labs     08/11/21  1330 08/11/21  2220 08/18/21  0702   WBC 13.4*   < > 12.5*   HGB 6.0*   < > 7.4*   HCT 18.6*   < > 23.2*   PLTCNT 369   < > 255   PMNS 83*   < > 83   LYMPHOCYTES 8*  --   --    EOSINOPHIL 2  --   --     < > = values in this interval not displayed.     Lab Results   Component Value Date/Time    IRONBINDCAP 263 08/05/2021 06:03 PM    IRONSAT 13 (L) 08/05/2021 06:03 PM    FERRITIN 940 (H) 08/05/2021 06:03 PM     No results for input(s): FOLATE, VITB12 in the last 72 hours.  No results found for any visits on 08/12/21 (from the past 24 hour(s)).      Evaluated prior to dialysis,  pt states, "I am fine, my blood pressure is always high, I don't have any fluid on my lungs." She is refusing dialysis today, states she doesn't want to run 2 days in a row. HD orders cancelled. Pt is in no distress, denies cp/sob, has no edema, lungs CTA.     Recommendations:  ~Encouraged pt do dialyze today for UF due to hypertension requiring Nicardipine gtt, pt adamantly refusing. Will dialyze tomorrow per her outpatient schedule  ~BMP/mag/phos on dialysis days      acetaminophen (TYLENOL) tablet, 650 mg, Oral, Q4H PRN  albuterol (PROVENTIL) 2.5 mg / 3 mL (0.083%) neb solution, 2.5 mg, Nebulization, Q4H PRN  apixaban (ELIQUIS) tablet, 10 mg, Oral, 2x/day   Followed by  Derrill Memo ON 08/22/2021] apixaban (ELIQUIS) tablet, 5 mg, Oral, 2x/day  budesonide-formoterol (SYMBICORT) 160 mcg-4.5 mcg per inhalation oral inhaler - "Respiratory to administer", 2 Puff, Inhalation, 2x/day  cinacalcet (SENSIPAR) tablet, 30 mg, Oral, QPM  cloNIDine (CATAPRES-TTS) transdermal patch 0.3 mg, 0.3 mg, Transdermal, Q7 Days  clopidogrel (PLAVIX) 75 mg tablet, 75 mg, Oral, Daily  cyclobenzaprine (FLEXERIL) tablet, 5 mg, Oral, 2x/day PRN  D5W 250 mL flush bag, , Intravenous, Q15 Min PRN  dilTIAZem (CARDIZEM  CD) 24 hr extended release capsule, 180 mg, Oral, Daily  doxazosin (CARDURA) tablet, 4 mg, Oral, QPM  dronabinol (MARINOL) capsule, 2.5 mg, Oral, 3x/day  hydrALAZINE (APRESOLINE) injection 5 mg, 5 mg, Intravenous, Q6H PRN  HYDROmorphone (DILAUDID) 1 mg/mL injection, 0.2 mg, Intravenous, Once PRN  isosorbide mononitrate (IMDUR) 24 hr extended release tablet, 120 mg, Oral, QAM  labetaloL (NORMODYNE) tablet 300 mg, 300 mg, Oral, Q12H  lidocaine-menthol (LIDOPATCH) 3.6%-1.25% patch, 1 Patch, Transdermal, Daily  montelukast (SINGULAIR) 10 mg tablet, 10 mg, Oral, QPM  niCARdipine (CARDENE) 50 mg in NS 250 mL (tot vol) infusion, 5 mg/hr, Intravenous, Continuous  nicotine (NICODERM CQ) transdermal patch (mg/24 hr), 14 mg, Transdermal, Daily  NS 250 mL flush bag, , Intravenous, Q15 Min PRN  NS flush syringe, 2-6 mL, Intracatheter, Q8HRS  NS flush syringe, 2-6 mL, Intracatheter, Q1 MIN PRN  ondansetron (ZOFRAN) 2 mg/mL injection, 4 mg, Intravenous, Q8H PRN  oxyCODONE (ROXICODONE) immediate release tablet, 2.5 mg, Oral, Q4H PRN   Or  oxyCODONE (ROXICODONE) immediate release tablet, 5 mg, Oral, Q4H PRN  pantoprazole (PROTONIX) delayed release tablet, 40 mg, Oral, Daily  phenol (CHLORASEPTIC) 1.4% oromucosal spray, 1 Spray, Mouth/Throat, Q4H PRN  polyethylene glycol (MIRALAX) oral packet, 17 g, Oral, Daily  premixed hemodialysate (NATURALYTE) 3.43 L with potassium chloride 2 mEq/L, calcium chloride 2.5 mEq/L, , Hemodialysis, Give in Dialysis  sennosides-docusate sodium (SENOKOT-S) 8.6-50mg  per tablet, 1 Tablet, Oral, 2x/day  sertraline (ZOLOFT) tablet, 50 mg, Oral, Daily  sodium citrate 4% injection syringe, 2 Syringe, Intracatheter, Give in Dialysis  vancomycin (VANCOCIN) 1,500 mg in NS 500 mL IVPB, 1,500 mg, Intravenous, Once  Vancomycin IV - Pharmacist to Dose per Protocol, , Does not apply, Daily PRN  Vancomycin IV Intermittent Dosing, , Does not apply, Daily PRN        I, independently of the faculty provider, spent a total  of 25 minutes in direct/indirect care of this patient including initial evaluation, review of laboratory, radiology, diagnostic studies, review of medical record, order entry and coordination of care.      Meredeth Ide, APRN 08/18/2021, 10:07  Nephrology Consult team 1      Attending attestation note:    Attending attestation note:    I saw and examined the patient.  I reviewed NP  Neuman's note.  I agree with the findings and plan of care as documented in the NP's note. Any additions/changes were made accordingly. We advised the patient to do HD today, but she prefers to take the day off.      08/18/2021, 19:07    Jacqlyn Larsen, M.D., M.S., FASN  Assistant Professor of Medicine  Section of Nephrology

## 2021-08-18 NOTE — Nurses Notes (Signed)
Pt complains of nausea and stomach up set. Given PRN IV Zofran 4 mg around 1600. Page to hosp 4 service.

## 2021-08-18 NOTE — Progress Notes (Addendum)
Hospitalist [employer] note    Paged by nursing earlier in the evening    Patient reporting chest pain:  - ordered EKG, troponin.     EKG with no change troponin of 91 (prior was 100).   -Dilaudid ordered    Later the patient still having chest pain.  - Ordered additional dilaudid. BP elevated. Ordered hydralazine.     I came to bedside.     Exam: General. The patient is holding her chest, CV: tachy and regular, no murmur Lungs: clear to ascultation    (chart review demonstrates that patient already had CT PE protocol this admission without PE:   -ordered addition dilaudid. Another EKG and troponin.   -already on Plavix, is receiving oral anticoagulant      Text page later, after hydralazine. SBP is over 200, ordered nicardipine drip. EKG came back showing diffuse st depressions. the patient has a slight st elevation that is seen on prior EKG's which is unchanged.     VS: Blood Pressure (Abnormal) 207/127   Pulse (Abnormal) 109      Discussed EKG with cards, no significant ST elevation but evidence of increase demand- agreed with treating blood pressure.       Critical Care Time: Total critical care time spent in direct care of this patient at high risk based on presenting history/exam/and complaint, including evaluation and stabilization, review of data, re-examination, discussion with any consulting services to arrange definitive care, and exclusive of any procedures performed, was 20 minutes

## 2021-08-18 NOTE — Nurses Notes (Signed)
BP 210/131 after prn hydralazine. Pt continues to c/o chest pain. Dr Rolley Sims notified via text page. Troponin and ECG done.

## 2021-08-18 NOTE — Care Management Notes (Signed)
CCC was tasked to follow for possible discharge over the weekend. Patient is not DC ready at this time. He is on Nicardipine drip.     CCC/MSW to follow    Reggy Eye RN  Care Management Department  [email or web address]  [phone]  Ext [phone]

## 2021-08-18 NOTE — Nurses Notes (Signed)
5E Rm 1 [place]  Pt recent BP 150/110, PRN order for sys >160. Patient resting.     [id number]

## 2021-08-18 NOTE — Nurses Notes (Signed)
Morning assessment complete via flowsheet at 0755. Morning medications given per MAR. Patient alert and oriented x4. Fall (moderate) precautions in place and maintained. Patient very agitated and unwilling to cooperate this AM. Patient just asking to rest at this time. Up ad lib. Cardiac diet. Call bell in reach.     Fabiola Backer, RN

## 2021-08-18 NOTE — Nurses Notes (Signed)
Pt resting quietly. Denies any chest pain or concern at this time. Pt is going to HD tomorrow.

## 2021-08-18 NOTE — Nurses Notes (Signed)
Afternoon assessment complete via flowsheet at 1203. No changes to previous assessment. Call bell in reach.     Fabiola Backer, RN

## 2021-08-18 NOTE — Care Plan (Signed)
Problem: Adult Inpatient Plan of Care  Goal: Plan of Care Review  Outcome: Ongoing (see interventions/notes)  Goal: Patient-Specific Goal (Individualized)  Outcome: Ongoing (see interventions/notes)  Flowsheets (Taken 08/18/2021 0755)  Anxieties, Fears or Concerns: patient agitated and wanting to sleep  Plan of Care Reviewed With: patient  Goal: Absence of Hospital-Acquired Illness or Injury  Outcome: Ongoing (see interventions/notes)  Intervention: Identify and Manage Fall Risk  Recent Flowsheet Documentation  Taken 08/18/2021 0755 by Fabiola Backer, RN  Safety Promotion/Fall Prevention:   activity supervised   fall prevention program maintained   motion sensor pad activated   nonskid shoes/slippers when out of bed   safety round/check completed  Intervention: Prevent Skin Injury  Recent Flowsheet Documentation  Taken 08/18/2021 0755 by Fabiola Backer, RN  Body Position: semi-fowlers (30-45 degrees)  Skin Protection:   adhesive use limited   tubing/devices free from skin contact   transparent dressing maintained  Intervention: Prevent and Manage VTE (Venous Thromboembolism) Risk  Recent Flowsheet Documentation  Taken 08/18/2021 0755 by Fabiola Backer, RN  VTE Prevention/Management:   ambulation promoted   dorsiflexion/plantar flexion performed   anticoagulant therapy maintained  Goal: Optimal Comfort and Wellbeing  Outcome: Ongoing (see interventions/notes)  Intervention: Provide Person-Centered Care  Recent Flowsheet Documentation  Taken 08/18/2021 0755 by Fabiola Backer, [place] Relationship/Rapport:   care explained   questions answered   thoughts/feelings acknowledged   choices provided  Goal: Rounds/Family Conference  Outcome: Ongoing (see interventions/notes)     Problem: Fall Injury Risk  Goal: Absence of Fall and Fall-Related Injury  Outcome: Ongoing (see interventions/notes)  Intervention: Identify and Manage Contributors  Recent Flowsheet Documentation  Taken 08/18/2021 0755 by Fabiola Backer, RN  Self-Care Promotion: independence encouraged  Intervention: Promote Injury-Free Environment  Recent Flowsheet Documentation  Taken 08/18/2021 0755 by Fabiola Backer, RN  Safety Promotion/Fall Prevention:   activity supervised   fall prevention program maintained   motion sensor pad activated   nonskid shoes/slippers when out of bed   safety round/check completed     Problem: Hemodialysis  Goal: Safe, Effective Therapy Delivery  Outcome: Ongoing (see interventions/notes)  Goal: Effective Tissue Perfusion  Outcome: Ongoing (see interventions/notes)  Goal: Absence of Infection Signs and Symptoms  Outcome: Ongoing (see interventions/notes)  Intervention: Prevent or Manage Infection  Recent Flowsheet Documentation  Taken 08/18/2021 0755 by Fabiola Backer, RN  Fever Reduction/Comfort Measures:   lightweight bedding   lightweight clothing     Problem: Chronic Kidney Disease  Goal: Optimal Coping with Chronic Illness  Outcome: Ongoing (see interventions/notes)  Intervention: Support Psychosocial Response  Recent Flowsheet Documentation  Taken 08/18/2021 0755 by Fabiola Backer, RN  Family/Support System Care:   self-care encouraged   support provided  Supportive Measures: active listening utilized  Goal: Electrolyte Balance  Outcome: Ongoing (see interventions/notes)  Goal: Fluid Balance  Outcome: Ongoing (see interventions/notes)  Intervention: Monitor and Manage Hypervolemia  Recent Flowsheet Documentation  Taken 08/18/2021 0755 by Fabiola Backer, RN  Skin Protection:   adhesive use limited   tubing/devices free from skin contact   transparent dressing maintained  Goal: Optimal Functional Ability  Outcome: Ongoing (see interventions/notes)  Intervention: Optimize Functional Ability  Recent Flowsheet Documentation  Taken 08/18/2021 0755 by Fabiola Backer, RN  Self-Care Promotion: independence encouraged  Activity Management: up ad lib  Goal: Absence of Anemia Signs and Symptoms  Outcome: Ongoing (see  interventions/notes)  Goal: Optimal Oral Intake  Outcome: Ongoing (see interventions/notes)  Goal: Acceptable Pain Control  Outcome: Ongoing (see interventions/notes)  Goal: Minimize Renal Failure Effects  Outcome: Ongoing (see interventions/notes)    Patient alert and oriented. Patient very agitated, not willing to participate in care at this time. Meds given per MAR. Fall precautions in place and maintained. No pain at this time. Up ad lib. Cardiac diet.     Fabiola Backer, RN

## 2021-08-18 NOTE — Progress Notes (Signed)
[facility]  Medicine Progress Note  Full Code   [name]  Date of service: 08/18/2021  Date of Admission:  08/12/2021    Hospital Day:  LOS: 6 days      Subjective: Pt noted to have CP and HTN overnight, briefly requiring nicardipine gtt but now off. She currently denies any CP. She has no complaints at this time.     Vital Signs:  Temp  Avg: 36.6 C (97.9 F)  Min: 36.4 C (97.5 F)  Max: 36.7 C (98.1 F)    Pulse  Avg: 93.5  Min: 66  Max: 121 BP  Min: 134/90  Max: 221/117   Resp  Avg: 13.9  Min: 0  Max: 30 SpO2  Avg: 97.2 %  Min: 89 %  Max: 100 %          Input/Output    Intake/Output Summary (Last 24 hours) at 08/18/2021 1014  Last data filed at 08/17/2021 1800  Gross per 24 hour   Intake 650 ml   Output --   Net 650 ml    I/O last shift:  No intake/output data recorded.   acetaminophen (TYLENOL) tablet, 650 mg, Oral, Q4H PRN  albuterol (PROVENTIL) 2.5 mg / 3 mL (0.083%) neb solution, 2.5 mg, Nebulization, Q4H PRN  apixaban (ELIQUIS) tablet, 10 mg, Oral, 2x/day   Followed by  Derrill Memo ON 08/22/2021] apixaban (ELIQUIS) tablet, 5 mg, Oral, 2x/day  budesonide-formoterol (SYMBICORT) 160 mcg-4.5 mcg per inhalation oral inhaler - "Respiratory to administer", 2 Puff, Inhalation, 2x/day  cinacalcet (SENSIPAR) tablet, 30 mg, Oral, QPM  cloNIDine (CATAPRES-TTS) transdermal patch 0.3 mg, 0.3 mg, Transdermal, Q7 Days  clopidogrel (PLAVIX) 75 mg tablet, 75 mg, Oral, Daily  cyclobenzaprine (FLEXERIL) tablet, 5 mg, Oral, 2x/day PRN  D5W 250 mL flush bag, , Intravenous, Q15 Min PRN  dilTIAZem (CARDIZEM CD) 24 hr extended release capsule, 180 mg, Oral, Daily  doxazosin (CARDURA) tablet, 4 mg, Oral, QPM  dronabinol (MARINOL) capsule, 2.5 mg, Oral, 3x/day  hydrALAZINE (APRESOLINE) injection 5 mg, 5 mg, Intravenous, Q6H PRN  HYDROmorphone (DILAUDID) 1 mg/mL injection, 0.2 mg, Intravenous, Once PRN  isosorbide mononitrate (IMDUR) 24 hr extended release tablet, 120 mg, Oral, QAM  labetaloL (NORMODYNE) tablet 300 mg, 300  mg, Oral, Q12H  lidocaine-menthol (LIDOPATCH) 3.6%-1.25% patch, 1 Patch, Transdermal, Daily  montelukast (SINGULAIR) 10 mg tablet, 10 mg, Oral, QPM  niCARdipine (CARDENE) 50 mg in NS 250 mL (tot vol) infusion, 5 mg/hr, Intravenous, Continuous  nicotine (NICODERM CQ) transdermal patch (mg/24 hr), 14 mg, Transdermal, Daily  NS 250 mL flush bag, , Intravenous, Q15 Min PRN  NS flush syringe, 2-6 mL, Intracatheter, Q8HRS  NS flush syringe, 2-6 mL, Intracatheter, Q1 MIN PRN  ondansetron (ZOFRAN) 2 mg/mL injection, 4 mg, Intravenous, Q8H PRN  oxyCODONE (ROXICODONE) immediate release tablet, 2.5 mg, Oral, Q4H PRN   Or  oxyCODONE (ROXICODONE) immediate release tablet, 5 mg, Oral, Q4H PRN  pantoprazole (PROTONIX) delayed release tablet, 40 mg, Oral, Daily  phenol (CHLORASEPTIC) 1.4% oromucosal spray, 1 Spray, Mouth/Throat, Q4H PRN  polyethylene glycol (MIRALAX) oral packet, 17 g, Oral, Daily  premixed hemodialysate (NATURALYTE) 3.43 L with potassium chloride 2 mEq/L, calcium chloride 2.5 mEq/L, , Hemodialysis, Give in Dialysis  sennosides-docusate sodium (SENOKOT-S) 8.6-50mg  per tablet, 1 Tablet, Oral, 2x/day  sertraline (ZOLOFT) tablet, 50 mg, Oral, Daily  sodium citrate 4% injection syringe, 2 Syringe, Intracatheter, Give in Dialysis  vancomycin (VANCOCIN) 1,500 mg in NS 500 mL IVPB, 1,500 mg, Intravenous, Once  Vancomycin IV -  Pharmacist to Dose per Protocol, , Does not apply, Daily PRN  Vancomycin IV Intermittent Dosing, , Does not apply, Daily PRN        Physical Exam:  Constitutional:  appears chronically ill, appears stated age and no distress. Vitals reviewed   Eyes:  Conjunctiva clear, sclera non-icteric, pupils PERRLA, EOMI   ENT:  Mouth mucous membranes moist. , Pharynx without injection or exudate. No oral lesions   Neck:  supple, symmetrical, trachea midline  Respiratory:  clear but diminished on RA   Cardiovascular:  regular rate and rhythm, S1, S2 normal, no murmur, click, rub or gallop  Gastrointestinal:   Soft, non-tender, Bowel sounds normal, Non-tender, non-distended  Musculoskeletal:  Head atraumatic and normocephalic  Integumentary:  Skin warm and dry, TCC in place   Neurologic:  Alert and oriented x3, No tremor, No asterixis  Psychiatric:  Affect Normal, Speech normal      Labs: I have reviewed all labs  CBC Results Differential Results   Recent Results (from the past 30 hour(s))   CBC WITH DIFF    Collection Time: 08/18/21  7:02 AM   Result Value    WBC 12.5 (H)    HGB 7.4 (L)    HCT 23.2 (L)    PLATELETS 255    Recent Results (from the past 30 hour(s))   CBC WITH DIFF    Collection Time: 08/18/21  7:02 AM   Result Value    WBC 12.5 (H)    NEUTROPHIL % 83    MONOCYTE % 8    BASOPHIL % 0    BASOPHIL # <0.10      BMP Results Other Chemistries Results   Results for orders placed or performed during the hospital encounter of 08/12/21 (from the past 30 hour(s))   BASIC METABOLIC PANEL    Collection Time: 08/18/21  7:02 AM   Result Value    SODIUM 133 (L)    POTASSIUM 5.3 (H)    CHLORIDE 98    CO2 TOTAL 24    GLUCOSE 108    BUN 34 (H)    CREATININE 7.83 (H)    Recent Results (from the past 30 hour(s))   PHOSPHORUS    Collection Time: 08/18/21  7:02 AM   Result Value    PHOSPHORUS 5.9 (H)   MAGNESIUM    Collection Time: 08/18/21  7:02 AM   Result Value    MAGNESIUM 2.0      Liver/Pancreas Enzyme Results Liver Function Results   No results found for this or any previous visit (from the past 30 hour(s)). No results found for this or any previous visit (from the past 30 hour(s)).   Cardiac Results Coags Results   Results for orders placed or performed during the hospital encounter of 08/12/21 (from the past 30 hour(s))   TROPONIN-I    Collection Time: 08/18/21  1:09 AM   Result Value    TROPONIN I 101 (H)    No results found for this or any previous visit (from the past 30 hour(s)).     Radiology: I have reviewed all imaging  Results for orders placed or performed during the hospital encounter of 08/12/21 (from the past  72 hour(s))   XR AP MOBILE CHEST     Status: None    Narrative    Kailen Milham  Female, 46 years old.    XR AP MOBILE CHEST performed on 08/18/2021 3:11 AM.    REASON FOR EXAM:  chest pain, tachycardia  TECHNIQUE: 1 views/1 images submitted for interpretation.    COMPARISON:  08/13/2021    FINDINGS:  Right IJ central venous catheter is stable in positioning. SVC stent is again noted.    Normal cardiomediastinal silhouette. Persistent, though improved patchy opacities at the right base. No large pleural effusion or pneumothorax.      Impression    Improved lung aeration.     PT/OT: Yes    Consults:   Consult Orders Placed This Encounter   Procedures   . IP CONSULT TO IR - VASCULAR/BODY - ORDERING PROVIDER MUST CALL/PAGE SERVICE   . IP CONSULT TO NEPHROLOGY - ORDERING PROVIDER MUST CALL/PAGE SERVICE   . IP CONSULT TO PAIN MANAGEMENT - ORDERING PROVIDER MUST CALL/PAGE SERVICE   . IP CONSULT TO PSYCHIATRY - ADULT - ORDERING PROVIDER MUST CALL/PAGE SERVICE   . IP CONSULT TO PULMONARY MEDICINE - SLEEP MEDICINE - ORDERING PROVIDER MUST CALL/PAGE SERVICE   . IP CONSULT TO DISCHARGE PHARMACY FOR PRESCRIPTION COST INQUIRY   . IP CONSULT TO INFECTIOUS DISEASES - ORDERING PROVIDER MUST CALL/PAGE SERVICE   . IP CONSULT TO OPAT (OUTPATIENT PARENTERAL ANTIMICROBIAL THERAPY PROGRAM)   . IP CONSULT TO OPAT (OUTPATIENT PARENTERAL ANTIMICROBIAL THERAPY PROGRAM)       Hardware (lines, foley's, tubes):   Patient Lines/Drains/Airways Status     Active Line / Dialysis Catheter / Dialysis Graft / Drain / Airway / Wound     Name Placement date Placement time Site Days    Peripheral IV Ultrasound guided Right Forearm 08/12/21  1725  -- 5    AV Fistula Left;Upper Arm 12/05/20  1502  -- 255    Dialysis Catheter Cuffed/Tunneled 08/13/21  1316  -- 4                Assessment/ Plan:   Active Hospital Problems    Diagnosis   . Primary Problem: SVC syndrome   . 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 catheter replacement on 08/13/2021   - s/p IR thrombectomy and stenting and TCC replacement on 6/20  - Plan to switch to Eliquis and Plavix today 6/22    - consult to discharge pharmacy placed to obtain cost inquiry for eliquis    Hypertensive Emergency, resolved  hx of HTN  Hx of HFpEF not in acute exacerbation   - nicardipine drip discontinued    - home anti hypertensive resumed: labetalol 200 mg PO BID, cardizem 120 mg PO daily, clonidine 0.3 transdermal patch weekly, doxazosin 4 mg PO, Imdur, 60 mg PO   - per MICU note - EF 55-60% in 2020   - repeat TTE done this admission with EF 65%, indeterminate diastolic function, severe left ventricular hypertrophy     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 Patient does not want this restarted at this time  - Pt now back on regular dialysis schedule after session 6/23   - Pt with noted BP's in 210's and CP overnight  - Started on nicardipine gtt now off  - Increase labetalol to 300 mg and add Imdur     Coagulase negative staph bacteremia vs possible blood culture contaminant   - blood cultures drawn at  [place] with 1/4 bottles positive for gram negative staph   - WBC and procal WNL, CRP mildly elevated however could be secondary to thrombus   - Vanc started on arrival; will continue for now until repeat blood cultures result  - Given persistently positive cultures, ID consulted   - Plan for 6 weeks of Vanc   - OPAT consulted, awaiting recs      OSA   - sleep medicine consulted   - wears 2L NC q HS at home   - CPAP qHS ordered inpatient    Possible Suicidal Ideations   - psych following, low concern at this time  - DC SI precautions     Chronic back pain   - lidocaine patch, PRN tylenol, flexeril,  oxycodone   - pain management consulted                - add 2.5 mg marinol TID    Acute on Chronic Normocytic Anemia   - no current s/s bleeding   - baseline Hgb around 9   - Hgb today 7.2   - transfuse Hgb < 7    Asthma  COPD  Tobacco Use Disorder    - wean oxygen as tolerated    - continue home Symbicort    - continue nicotine patch    - PRN albuterol nebulizers ordered    DVT/PE Prophylaxis: Apixaban    Disposition Planning: Home discharge      Melanie Crazier, APRN  Department of Emergency Medicine  Department of Hospital Medicine   Pager [phone]    I personally saw and evaluated the patient. See APP's note for additional details. I agree with the findings and plan of care as documented in the APP's note. Any exceptions/additions are edited/noted. My findings/participation are:     S: Overnight, the patient had hypertensive urgency overnight and was started on a nicardipine gtt although this was weaned off eventually. The patient is in no distress this morning and has no complaints. She is not interested in dialysis having a dialysis session today.     Review of Systems - All other systems reviewed and negative except as noted in subjective/HPI.    O:   Filed Vitals:    08/18/21 1014 08/18/21 1107 08/18/21 1114 08/18/21 1129   BP: (!) 160/114   (!) 140/100   Pulse: 81   85   Resp:   13    Temp:  36.6 C (97.9 F)     SpO2: 97%   98%         A/P:   Hypertensive urgency  -add Imdur yesterday  -increase labetalol today  -patient will need to have another dialysis session soon to alleviate hypertension and intravascular volume overload.     Leukocytosis  -noted today.  -will monitor.     Derenda Mis, MD

## 2021-08-18 NOTE — Pharmacy (Signed)
[place] / [facility] Services  Therapeutic Drug Monitoring: Vancomycin  08/18/2021      Patient name: [name], [name]  Date of Birth:  Nov 05, 1975    Actual Weight:  Weight: 86.8 kg (191 lb 5.8 oz) (08/18/21 0530)     BMI:         Date RPh Current regimen (including mg/kg) Indication &  Organism AUC or trough based dosing Target Levels^ SCr (mg/dL) CrCl* (mL/min) Measured level(s)   (mcg/mL) Calculated AUC (if AUC based monitoring) Plan & predicted AUC/trough if initial dosing (including when levels are due) Comments   6/20 NLKS Vancomycin to start Empiric, GPC on blood culture 6/18 trough  11.5 ESRD  N/A - Load vancomycin 20 mg/kg post iHD today   - No further vancomycin doses until after repeat iHD session  - f/u blood culture from 6/18 and repeat from 6/20   6/22 PT Intermittent    S/p Vanc 1500mg  6/20 @ 1720 Empiric- Bacteremia Intermittent  Trough 20-25   (pre-HD) 9.5  (HD) 9.4  (HD)   - Pt getting HD today, give Vanc 1000 mg (15 mg/kg) x 1 dose in HD. Obtain pre-HD lvl prior to 3rd HD session (next session).  - update: 6/20 blood cultures: GPCs (confirmed)   6/23 NLD Intermittent - last dose 1 g on 6/22 @ 1102     CoNS bacteremia Trough 20-25   (pre-HD) ESRD ESRD 15.3   (pre-HD level ~23 hrs after last dose) N/A  - Give 1500 mg with HD today    - Follow RRT plans for redosing and level needs     6/24 PT Intermittent  last dose 1500mg   on 6/23 @ 1356 CoNS bacteremia Trough 20-25  (Pre-HD) 5.4  (HD) 14.7  (HD) 23.5  (~28 hrs post-dose) N/A - Vanc random lvl WNL- continue Vanc 1500 mg with HD (not yet ordered)  - Follow RRT plans for re-dosing  - iHD ordered for 6/26, will order Vanc 1.5 x 1 for after ihd                                                    ^Target levels depends on dosing and monitoring method, AUC vs. trough based. For AUC based dosing units are mg*h/L. For trough based dosing units are mcg/mL.     *Creatinine clearance is estimated by using the Cockcroft-Gault equation for  adult patients and the Carol Ada for pediatric patients.    The decision to discontinue vancomycin therapy will be determined by the primary service.  Please contact the pharmacist with any questions regarding this patient's medication regimen.

## 2021-08-19 ENCOUNTER — Inpatient Hospital Stay (HOSPITAL_COMMUNITY): Payer: Commercial Managed Care - PPO

## 2021-08-19 DIAGNOSIS — Z79899 Other long term (current) drug therapy: Secondary | ICD-10-CM

## 2021-08-19 DIAGNOSIS — E871 Hypo-osmolality and hyponatremia: Secondary | ICD-10-CM

## 2021-08-19 DIAGNOSIS — Z7901 Long term (current) use of anticoagulants: Secondary | ICD-10-CM

## 2021-08-19 LAB — BASIC METABOLIC PANEL
ANION GAP: 12 mmol/L (ref 4–13)
BUN/CREA RATIO: 4 — ABNORMAL LOW (ref 6–22)
BUN: 46 mg/dL — ABNORMAL HIGH (ref 8–25)
CALCIUM: 8.9 mg/dL (ref 8.5–10.0)
CHLORIDE: 99 mmol/L (ref 96–111)
CO2 TOTAL: 21 mmol/L — ABNORMAL LOW (ref 22–30)
CREATININE: 10.24 mg/dL — ABNORMAL HIGH (ref 0.60–1.05)
ESTIMATED GFR: 4 mL/min/BSA — ABNORMAL LOW (ref >=60)
GLUCOSE: 81 mg/dL (ref 65–125)
POTASSIUM: 4.6 mmol/L (ref 3.5–5.1)
SODIUM: 132 mmol/L — ABNORMAL LOW (ref 136–145)

## 2021-08-19 LAB — CBC WITH DIFF
HCT: 20.7 % — ABNORMAL LOW (ref 34.8–46.0)
HGB: 6.8 g/dL — CL (ref 11.5–16.0)
MCH: 30 pg (ref 26.0–32.0)
MCHC: 32.9 g/dL (ref 31.0–35.5)
MCV: 91.2 fL (ref 78.0–100.0)
MPV: 8.9 fL (ref 8.7–12.5)
PLATELETS: 232 10*3/uL (ref 150–400)
RBC: 2.27 10*6/uL — ABNORMAL LOW (ref 3.85–5.22)
RDW-CV: 17.9 % — ABNORMAL HIGH (ref 11.5–15.5)
WBC: 11.1 10*3/uL — ABNORMAL HIGH (ref 3.7–11.0)

## 2021-08-19 LAB — MANUAL DIFF AND MORPHOLOGY-SYSMEX
BASOPHIL #: 0.11 10*3/uL (ref <=0.20)
BASOPHIL %: 1 %
EOSINOPHIL #: 0.22 10*3/uL (ref <=0.50)
EOSINOPHIL %: 2 %
LYMPHOCYTE #: 0.89 10*3/uL — ABNORMAL LOW (ref 1.00–4.80)
LYMPHOCYTE %: 8 %
MONOCYTE #: 0.56 10*3/uL (ref 0.20–1.10)
MONOCYTE %: 5 %
NEUTROPHIL #: 9.32 10*3/uL — ABNORMAL HIGH (ref 1.50–7.70)
NEUTROPHIL %: 84 %

## 2021-08-19 LAB — PHOSPHORUS: PHOSPHORUS: 6.7 mg/dL — ABNORMAL HIGH (ref 2.4–4.7)

## 2021-08-19 LAB — MAGNESIUM: MAGNESIUM: 2 mg/dL (ref 1.8–2.6)

## 2021-08-19 MED ORDER — ISOSORBIDE MONONITRATE ER 120 MG TABLET,EXTENDED RELEASE 24 HR
120.0000 mg | ORAL_TABLET | Freq: Every morning | ORAL | 0 refills | Status: DC
Start: 2021-08-20 — End: 2021-10-21

## 2021-08-19 MED ORDER — SODIUM CHLORIDE 0.9 % INTRAVENOUS SOLUTION
250.0000 mg | INTRAVENOUS | Status: AC
Start: 2021-08-19 — End: 2021-08-19
  Administered 2021-08-19: 0 mg via INTRAVENOUS
  Administered 2021-08-19: 250 mg via INTRAVENOUS
  Filled 2021-08-19: qty 20

## 2021-08-19 MED ORDER — APIXABAN 5 MG TABLET
10.0000 mg | ORAL_TABLET | Freq: Two times a day (BID) | ORAL | 0 refills | Status: DC
Start: 2021-08-19 — End: 2021-08-27

## 2021-08-19 MED ORDER — ACETAMINOPHEN 325 MG TABLET
650.0000 mg | ORAL_TABLET | ORAL | 0 refills | Status: DC | PRN
Start: 2021-08-19 — End: 2022-01-30

## 2021-08-19 MED ORDER — HEPARIN (PORCINE) 5,000 UNITS/ML BOLUS FOR DOSE ADJUSTMENT
5000.0000 [IU] | INTRAMUSCULAR | Status: AC
Start: 2021-08-19 — End: 2021-08-19
  Administered 2021-08-19: 5000 [IU] via INTRAVENOUS
  Filled 2021-08-19 (×3): qty 1

## 2021-08-19 MED ORDER — VANCOMYCIN 10 GRAM INTRAVENOUS SOLUTION
20.0000 mg/kg | INTRAVENOUS | Status: AC
Start: 2021-08-19 — End: 2021-08-19
  Administered 2021-08-19: 1500 mg via INTRAVENOUS
  Administered 2021-08-19: 0 mg via INTRAVENOUS
  Filled 2021-08-19: qty 15

## 2021-08-19 MED ORDER — SODIUM CHLORIDE 0.9 % IV BOLUS
40.0000 mL | INJECTION | Freq: Once | Status: DC | PRN
Start: 2021-08-19 — End: 2021-08-19

## 2021-08-19 MED ORDER — APIXABAN 5 MG TABLET
5.0000 mg | ORAL_TABLET | Freq: Two times a day (BID) | ORAL | 0 refills | Status: DC
Start: 2021-08-22 — End: 2021-08-31

## 2021-08-19 MED ORDER — DILTIAZEM CD 180 MG CAPSULE,EXTENDED RELEASE 24 HR
180.0000 mg | ORAL_CAPSULE | Freq: Every day | ORAL | 0 refills | Status: DC
Start: 2021-08-19 — End: 2021-10-21

## 2021-08-19 MED ORDER — LABETALOL 300 MG TABLET
300.0000 mg | ORAL_TABLET | Freq: Two times a day (BID) | ORAL | 0 refills | Status: DC
Start: 2021-08-19 — End: 2021-10-03

## 2021-08-19 MED ORDER — HEPARIN (PORCINE) 5,000 UNITS/ML BOLUS FOR DOSE ADJUSTMENT
1900.0000 [IU] | INTRAMUSCULAR | Status: AC
Start: 2021-08-19 — End: 2021-08-19
  Administered 2021-08-19: 2000 [IU] via INTRAVENOUS
  Filled 2021-08-19: qty 1

## 2021-08-19 MED ORDER — VANCOMYCIN 10 GRAM INTRAVENOUS SOLUTION
20.0000 mg/kg | INTRAVENOUS | 0 refills | Status: DC
Start: 2021-08-19 — End: 2021-10-01

## 2021-08-19 NOTE — Discharge Instructions (Signed)
If there are any other questions you have, or if a medical problem should develop, please call [phone] ([phone]). Our transitions nurses are available to assist you Monday thru Friday from 8 am-4:30 pm. These nurses can help you with questions regarding your discharge instructions. If this is after 4:30 pm, a weekend, or holiday please call and ask to speak to the doctor on call for the Hospitalist team.  In case of an emergency call 911.

## 2021-08-19 NOTE — Pharmacy (Signed)
[place] / [facility]   Antithrombotic Stewardship Program      Met with patient to complete apixaban (Eliquis) education with  Patient. Discussed details of Apixaban Education Guide:   - Patient's specific indication for apixaban  - Take apixaban twice daily, 12 hours apart (addressed transition from loading dose to maintenance dose, if applicable)   - What to do if a dose was missed or taken incorrectly  - Notify your doctor if you are prescribed any new medications to make sure they do not interact with apixaban  - For pain, avoid NSAIDs if possible, use acetaminophen (speak to PCP about pain options in liver disease)  - Avoid and/or minimize alcohol intake  - Minor v. major side bleeding and when to seek immediate medical attention         Patient did verbalize good understanding  Patient verbalized understanding and had no questions, however patient was not engaged in conversation and would benefit from follow-up education with nurses.      If concerns for understanding, what needs reinforced:   N/A      Notes or required follow up:   [place], [place]

## 2021-08-19 NOTE — Nurses Notes (Signed)
Received call from [facility]; they would like patient at the front of the hospital at 1700 for pick-up. I asked [facility] to call if the driver will be here any sooner or later than 1700.

## 2021-08-19 NOTE — Nurses Notes (Signed)
HGB 6.8. Hospitalist 4 and Dialysis RN, Marcene Brawn, notified.

## 2021-08-19 NOTE — Nurses Notes (Signed)
Patient taken to dialysis. 

## 2021-08-19 NOTE — Nurses Notes (Signed)
[facility] called stating they are in front of the hospital. Patient verbalized understanding of discharge instructions, follow-up appointments and home medications. Patient denies any questions or concerns at this time. PIV removed. Dialysis catheter left in place. Patient taken to lobby via wheelchair.

## 2021-08-19 NOTE — Consults (Signed)
Dialysis Services follow-up:  Date of service: 08/19/2021  Hospital Day:  LOS: 7 days     Assessment: 46 year old female with PMH of ESRD (history of non-compliance), chronic CHF, chronic atypical chest pain, GERD, tobacco use,asthma, COPD and uncontrolled HTN. Presents as a transfer from [place] where she presented w/ shortness of breath, wheezing  and diffuse volume overload after missing hemodialysis due to transportation issues. 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 Last HD was 6/19 at outside facility prior to transfer. Developed HTN requiring Nicardipine gtt on 6/25. Nephrology consulted for dialysis management.      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, etiology- hypertensive nephrosclerosis      Lytes: K 4.6, mild hyponatremia (132)  A/B: Bicarb 21  CKD MB:Ca, mag, at goal, phos 6.9  Sensipar 30 mg daily  Anemia: Hgb 6.8  Mircera 150 mcg q2wks (unknown last dose)  S/p 2 units PRBC this admission  Given Ferrlecit 6/22  ID  BCx: 6/20 NGTD  UCx: none  Antibiotics: azithromycin (6/18), ceftriaxone (6/18)  Cardiology: EF 65%, LVSF normal, RVSF cannot be assessed, RVSP indeterminate  Volume status  Appears euvolemic but has HTN  UO: not quantified but still makes small amt of urine  06/25 0700 - 06/26 0659  In: 120 [P.O.:120]  Out: -   Labs  Recent Labs     08/17/21  0534 08/18/21  0702   SODIUM 131* 133*   POTASSIUM 4.2 5.3*   CHLORIDE 99 98   CO2 23 24   BUN 17 34*   CREATININE 5.40* 7.83*   GFR 9* 6*   ANIONGAP 9 11     Recent Labs     08/17/21  0534 08/18/21  0702   CO2 23 24     Recent Labs     08/17/21  0534 08/18/21  0702   CALCIUM 9.4 9.5   MAGNESIUM 1.9 2.0   PHOSPHORUS 4.5 5.9*     No results found for: Tharon Aquas  PTH   Date Value Ref Range Status   08/06/2021 249.5 (H) 12.0 - 88.0 pg/mL Final     Recent Labs     08/11/21  1330 08/11/21  2220 08/18/21  0702   WBC 13.4*   < > 12.5*   HGB 6.0*   < > 7.4*   HCT 18.6*   < > 23.2*   PLTCNT 369   < > 255   PMNS 83*   < > 83   LYMPHOCYTES 8*  --   --    EOSINOPHIL 2  --   --     < > = values in this interval not displayed.     Lab Results   Component Value Date/Time    IRONBINDCAP 263 08/05/2021 06:03 PM    IRONSAT 13 (L) 08/05/2021 06:03 PM    FERRITIN 940 (H) 08/05/2021 06:03 PM     No results for input(s): FOLATE, VITB12 in the last 72 hours.  No results found for any visits on 08/12/21 (from the past 24 hour(s)).      Evaluated during dialysis, tolerating well and sleeping through treatment, plan for 4 liters off, access working  well R IJ TDC. Current BFR: 440 mL/min, venous pressure: 170 mmHg, arterial pressure: -210 mmHg.    Recommendations:  ~HD today, will aim for 4kg UF as tolerated  ~Will order heparin bolus during tx  ~Ferrlecit ordered to be given with HD  -Please transfuse 1 unit PRBC  ~BMP/mag/phos on dialysis days      acetaminophen (TYLENOL) tablet, 650 mg, Oral, Q4H PRN  albuterol (PROVENTIL) 2.5 mg / 3 mL (0.083%) neb solution, 2.5 mg, Nebulization, Q4H PRN  apixaban (ELIQUIS) tablet, 10 mg, Oral, 2x/day   Followed by  Derrill Memo ON 08/22/2021] apixaban (ELIQUIS) tablet, 5 mg, Oral, 2x/day  budesonide-formoterol (SYMBICORT) 160 mcg-4.5 mcg per inhalation oral inhaler - "Respiratory to administer", 2 Puff, Inhalation, 2x/day  cinacalcet (SENSIPAR) tablet, 30 mg, Oral, QPM  cloNIDine (CATAPRES-TTS) transdermal patch 0.3 mg, 0.3 mg, Transdermal, Q7 Days  clopidogrel (PLAVIX) 75 mg tablet, 75 mg, Oral, Daily  cyclobenzaprine (FLEXERIL) tablet, 5 mg, Oral, 2x/day PRN  D5W 250 mL flush bag, , Intravenous, Q15 Min PRN  dilTIAZem (CARDIZEM CD) 24 hr extended release capsule, 180 mg, Oral, Daily  doxazosin (CARDURA) tablet, 4 mg, Oral, QPM  dronabinol (MARINOL)  capsule, 2.5 mg, Oral, 3x/day  hydrALAZINE (APRESOLINE) injection 5 mg, 5 mg, Intravenous, Q6H PRN  HYDROmorphone (DILAUDID) 1 mg/mL injection, 0.2 mg, Intravenous, Once PRN  isosorbide mononitrate (IMDUR) 24 hr extended release tablet, 120 mg, Oral, QAM  labetaloL (NORMODYNE) tablet 300 mg, 300 mg, Oral, Q12H  lidocaine-menthol (LIDOPATCH) 3.6%-1.25% patch, 1 Patch, Transdermal, Daily  montelukast (SINGULAIR) 10 mg tablet, 10 mg, Oral, QPM  niCARdipine (CARDENE) 50 mg in NS 250 mL (tot vol) infusion, 5 mg/hr, Intravenous, Continuous  nicotine (NICODERM CQ) transdermal patch (mg/24 hr), 14 mg, Transdermal, Daily  NS 250 mL flush bag, , Intravenous, Q15 Min PRN  NS flush syringe, 2-6 mL, Intracatheter, Q8HRS  NS flush syringe, 2-6 mL, Intracatheter, Q1 MIN PRN  ondansetron (ZOFRAN) 2 mg/mL injection, 4 mg, Intravenous, Q8H PRN  oxyCODONE (ROXICODONE) immediate release tablet, 2.5 mg, Oral, Q4H PRN   Or  oxyCODONE (ROXICODONE) immediate release tablet, 5 mg, Oral, Q4H PRN  pantoprazole (PROTONIX) delayed release tablet, 40 mg, Oral, Daily  phenol (CHLORASEPTIC) 1.4% oromucosal spray, 1 Spray, Mouth/Throat, Q4H PRN  polyethylene glycol (MIRALAX) oral packet, 17 g, Oral, Daily  premixed hemodialysate (NATURALYTE) 3.43 L with potassium chloride 2 mEq/L, calcium chloride 2.5 mEq/L, , Hemodialysis, Give in Dialysis  sennosides-docusate sodium (SENOKOT-S) 8.6-50mg  per tablet, 1 Tablet, Oral, 2x/day  sertraline (ZOLOFT) tablet, 50 mg, Oral, Daily  sodium citrate 4% injection syringe, 2 Syringe, Intracatheter, Give in Dialysis  Vancomycin IV - Pharmacist to Dose per Protocol, , Does not apply, Daily PRN  Vancomycin IV Intermittent Dosing, , Does not apply, Daily PRN        I, independently of the faculty provider, spent a total of 25 minutes in direct/indirect care of this patient including initial evaluation, review of laboratory, radiology, diagnostic studies, review of medical record, order entry and coordination of  care.      Meredeth Ide, APRN 08/19/2021, 08:01  Nephrology Consult team 2    I saw and examined the patient.  I reviewed the fellow's note.  I agree with the findings and plan of care as documented in the fellow's note.  Any exceptions/additions are edited/noted.    Clydia Llano, MD

## 2021-08-19 NOTE — Progress Notes (Signed)
[facility]  Medicine Progress Note  Full Code   [name]  Date of service: 08/19/2021  Date of Admission:  08/12/2021    Hospital Day:  LOS: 7 days      Subjective: NAEO. Patient seen during dialysis. Informed her the plan will be to [place] once we have transportation arranged. She was happy to hear this.      Vital Signs:  Temp  Avg: 36.7 C (98.1 F)  Min: 36.5 C (97.7 F)  Max: 36.9 C (98.5 F)    Pulse  Avg: 76.8  Min: 65  Max: 94 BP  Min: 132/99  Max: 175/96   Resp  Avg: 15.7  Min: 8  Max: 19 SpO2  Avg: 97.2 %  Min: 92 %  Max: 100 %          Input/Output    Intake/Output Summary (Last 24 hours) at 08/19/2021 0944  Last data filed at 08/18/2021 2000  Gross per 24 hour   Intake 120 ml   Output --   Net 120 ml    I/O last shift:  No intake/output data recorded.   acetaminophen (TYLENOL) tablet, 650 mg, Oral, Q4H PRN  albuterol (PROVENTIL) 2.5 mg / 3 mL (0.083%) neb solution, 2.5 mg, Nebulization, Q4H PRN  apixaban (ELIQUIS) tablet, 10 mg, Oral, 2x/day   Followed by  Derrill Memo ON 08/22/2021] apixaban (ELIQUIS) tablet, 5 mg, Oral, 2x/day  budesonide-formoterol (SYMBICORT) 160 mcg-4.5 mcg per inhalation oral inhaler - "Respiratory to administer", 2 Puff, Inhalation, 2x/day  cinacalcet (SENSIPAR) tablet, 30 mg, Oral, QPM  cloNIDine (CATAPRES-TTS) transdermal patch 0.3 mg, 0.3 mg, Transdermal, Q7 Days  clopidogrel (PLAVIX) 75 mg tablet, 75 mg, Oral, Daily  cyclobenzaprine (FLEXERIL) tablet, 5 mg, Oral, 2x/day PRN  D5W 250 mL flush bag, , Intravenous, Q15 Min PRN  dilTIAZem (CARDIZEM CD) 24 hr extended release capsule, 180 mg, Oral, Daily  doxazosin (CARDURA) tablet, 4 mg, Oral, QPM  dronabinol (MARINOL) capsule, 2.5 mg, Oral, 3x/day  ferric gluconate (FERRLECIT) 250 mg in NS 100 mL IVPB, 250 mg, Intravenous, Give in Dialysis  hydrALAZINE (APRESOLINE) injection 5 mg, 5 mg, Intravenous, Q6H PRN  HYDROmorphone (DILAUDID) 1 mg/mL injection, 0.2 mg, Intravenous, Once PRN  isosorbide mononitrate  (IMDUR) 24 hr extended release tablet, 120 mg, Oral, QAM  labetaloL (NORMODYNE) tablet 300 mg, 300 mg, Oral, Q12H  lidocaine-menthol (LIDOPATCH) 3.6%-1.25% patch, 1 Patch, Transdermal, Daily  montelukast (SINGULAIR) 10 mg tablet, 10 mg, Oral, QPM  niCARdipine (CARDENE) 50 mg in NS 250 mL (tot vol) infusion, 5 mg/hr, Intravenous, Continuous  nicotine (NICODERM CQ) transdermal patch (mg/24 hr), 14 mg, Transdermal, Daily  NS 250 mL flush bag, , Intravenous, Q15 Min PRN  NS bolus infusion 40 mL, 40 mL, Intravenous, Once PRN  NS flush syringe, 2-6 mL, Intracatheter, Q8HRS  NS flush syringe, 2-6 mL, Intracatheter, Q1 MIN PRN  ondansetron (ZOFRAN) 2 mg/mL injection, 4 mg, Intravenous, Q8H PRN  oxyCODONE (ROXICODONE) immediate release tablet, 2.5 mg, Oral, Q4H PRN   Or  oxyCODONE (ROXICODONE) immediate release tablet, 5 mg, Oral, Q4H PRN  pantoprazole (PROTONIX) delayed release tablet, 40 mg, Oral, Daily  phenol (CHLORASEPTIC) 1.4% oromucosal spray, 1 Spray, Mouth/Throat, Q4H PRN  polyethylene glycol (MIRALAX) oral packet, 17 g, Oral, Daily  sennosides-docusate sodium (SENOKOT-S) 8.6-50mg  per tablet, 1 Tablet, Oral, 2x/day  sertraline (ZOLOFT) tablet, 50 mg, Oral, Daily  sodium citrate 4% injection syringe, 2 Syringe, Intracatheter, Give in Dialysis  vancomycin (VANCOCIN) 1,500 mg in NS 500 mL IVPB,  20 mg/kg (Adjusted), Intravenous, Give in Dialysis  Vancomycin IV - Pharmacist to Dose per Protocol, , Does not apply, Daily PRN  Vancomycin IV Intermittent Dosing, , Does not apply, Daily PRN        Physical Exam:  Constitutional:  appears chronically ill, appears stated age and no distress. Vitals reviewed   Eyes:  Conjunctiva clear, sclera non-icteric, pupils PERRLA, EOMI   ENT:  Mouth mucous membranes moist. , Pharynx without injection or exudate. No oral lesions   Neck:  supple, symmetrical, trachea midline  Respiratory:  clear but diminished on RA   Cardiovascular:  regular rate and rhythm, S1, S2 normal, no murmur, click,  rub or gallop  Gastrointestinal:  Soft, non-tender, Bowel sounds normal, Non-tender, non-distended  Musculoskeletal:  Head atraumatic and normocephalic  Integumentary:  Skin warm and dry, TCC in place   Neurologic:  Alert and oriented x3, No tremor, No asterixis  Psychiatric:  Affect Normal, Speech normal      Labs: I have reviewed all labs  CBC Results Differential Results   Recent Results (from the past 30 hour(s))   CBC WITH DIFF    Collection Time: 08/19/21  8:05 AM   Result Value    WBC 11.1 (H)    HGB 6.8 (LL)    HCT 20.7 (L)    PLATELETS 232    Recent Results (from the past 30 hour(s))   MANUAL DIFF AND MORPHOLOGY-SYSMEX    Collection Time: 08/19/21  8:05 AM   Result Value    NEUTROPHIL % 84    LYMPHOCYTE %  8    MONOCYTE % 5    EOSINOPHIL % 2    BASOPHIL % 1    BASOPHIL # 0.11   CBC WITH DIFF    Collection Time: 08/19/21  8:05 AM   Result Value    WBC 11.1 (H)      BMP Results Other Chemistries Results   Results for orders placed or performed during the hospital encounter of 08/12/21 (from the past 30 hour(s))   BASIC METABOLIC PANEL    Collection Time: 08/19/21  8:05 AM   Result Value    SODIUM 132 (L)    POTASSIUM 4.6    CHLORIDE 99    CO2 TOTAL 21 (L)    GLUCOSE 81    BUN 46 (H)    CREATININE 10.24 (H)    Recent Results (from the past 30 hour(s))   PHOSPHORUS    Collection Time: 08/19/21  8:05 AM   Result Value    PHOSPHORUS 6.7 (H)   MAGNESIUM    Collection Time: 08/19/21  8:05 AM   Result Value    MAGNESIUM 2.0      Liver/Pancreas Enzyme Results Liver Function Results   No results found for this or any previous visit (from the past 30 hour(s)). No results found for this or any previous visit (from the past 30 hour(s)).   Cardiac Results Coags Results   No results found for this or any previous visit (from the past 30 hour(s)). No results found for this or any previous visit (from the past 30 hour(s)).     Radiology: I have reviewed all imaging  Results for orders placed or performed during the hospital  encounter of 08/12/21 (from the past 72 hour(s))   XR AP MOBILE CHEST     Status: None    Narrative    Desteni Alf  Female, 46 years old.    XR AP MOBILE CHEST performed  on 08/18/2021 3:11 AM.    REASON FOR EXAM:  chest pain, tachycardia    TECHNIQUE: 1 views/1 images submitted for interpretation.    COMPARISON:  08/13/2021    FINDINGS:  Right IJ central venous catheter is stable in positioning. SVC stent is again noted.    Normal cardiomediastinal silhouette. Persistent, though improved patchy opacities at the right base. No large pleural effusion or pneumothorax.      Impression    Improved lung aeration.     PT/OT: Yes    Consults:   Consult Orders Placed This Encounter   Procedures   . IP CONSULT TO IR - VASCULAR/BODY - ORDERING PROVIDER MUST CALL/PAGE SERVICE   . IP CONSULT TO NEPHROLOGY - ORDERING PROVIDER MUST CALL/PAGE SERVICE   . IP CONSULT TO PAIN MANAGEMENT - ORDERING PROVIDER MUST CALL/PAGE SERVICE   . IP CONSULT TO PSYCHIATRY - ADULT - ORDERING PROVIDER MUST CALL/PAGE SERVICE   . IP CONSULT TO PULMONARY MEDICINE - SLEEP MEDICINE - ORDERING PROVIDER MUST CALL/PAGE SERVICE   . IP CONSULT TO DISCHARGE PHARMACY FOR PRESCRIPTION COST INQUIRY   . IP CONSULT TO INFECTIOUS DISEASES - ORDERING PROVIDER MUST CALL/PAGE SERVICE   . IP CONSULT TO OPAT (OUTPATIENT PARENTERAL ANTIMICROBIAL THERAPY PROGRAM)   . IP CONSULT TO OPAT (OUTPATIENT PARENTERAL ANTIMICROBIAL THERAPY PROGRAM)       Hardware (lines, foley's, tubes):   Patient Lines/Drains/Airways Status     Active Line / Dialysis Catheter / Dialysis Graft / Drain / Airway / Wound     Name Placement date Placement time Site Days    Peripheral IV Ultrasound guided Right Forearm 08/12/21  1725  -- 6    AV Fistula Left;Upper Arm 12/05/20  1502  -- 256    Dialysis Catheter Cuffed/Tunneled 08/13/21  1316  -- 5                Assessment/ Plan:   Active Hospital Problems    Diagnosis   . Primary Problem: SVC syndrome   . Hypertensive urgency     SVC Syndrome secondary  to near complete occlusive thrombuss/p SVC stent and tunnel dialysis catheter replacement on 08/13/2021  - s/p IR thrombectomy and stenting and TCC replacement on 6/20  - Discharge on Eliquis and Plavix today 6/26       Hypertensive Emergency, resolved  hx of HTN  Hx of HFpEF not in acute exacerbation  - home anti hypertensive resumed: labetalol 200 mg PO BID, cardizem 120 mg PO daily, clonidine 0.3 transdermal patch weekly, doxazosin 4 mg PO, Imdur, 60 mg PO  - during admission, the pateint's cardizem and labetalol were increased. The patient was restarted on Imdur.     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 Patient does not want this restarted at this time  - Increase labetalol to 300 mg and add Imdur     Coagulase negative staph bacteremia   - Given persistently positive cultures, ID consulted  - Plan for 6 weeks of Vanc  - OPAT consulted, appreciate recs     OSA  - sleep medicine consulted  - wears 2L NC q HS at home  - CPAP qHS ordered inpatient    PossibleSuicidal Ideations  - psych following, low concern at this time  - DC SI  precautions     Chronic back pain  - lidocaine patch, PRN tylenol, flexeril, oxycodone  - pain management consulted  - add 2.5 mg marinol TID    Acute on Chronic NormocyticAnemia  - no current s/s bleeding  - baseline Hgb around 9  - Hgb today 7.2  - transfuse Hgb <7   - patient received ferrlecit on 6/22       Asthma  COPD  Tobacco Use Disorder  - wean oxygen as tolerated  - continue home Symbicort  - continue nicotine patch  - PRN albuterol nebulizers orderedDVT/PE Prophylaxis: Apixaban    Disposition Planning: Home discharge      I personally spent greater than 30 minutes total on  discharge planning, coordination of care, and arranging follow-up.     Melanie Crazier, APRN  Department of Emergency Medicine  Department of [facility] Medicine   Pager [phone]    Derenda Mis, MD

## 2021-08-19 NOTE — Nurses Notes (Signed)
Hemodialysis completed. Total UF 3.5L. 1 unit prbc given during treatment.

## 2021-08-19 NOTE — Transitional Care (Signed)
[facility] Medicine   Transitional Care Coordination   Initial Assessment       Name: [name]   Date of Birth: 04-Nov-1975 46 y.o.  Date of service: 08/19/2021         Attempted to reach patient to complete assessment.  No answer noted at bedside phone currently.  Mobile number attempted. Patient off floor for dialysis.   Odis Hollingshead, LPN

## 2021-08-19 NOTE — Discharge Summary (Addendum)
[facility]  DISCHARGE SUMMARY    PATIENT NAME:  [name], [name]  MRN:  [id number]  DOB:  Jul 09, 1975    ENCOUNTER DATE:  08/12/2021  INPATIENT ADMISSION DATE: 08/12/2021  DISCHARGE DATE:  08/19/2021    ATTENDING PHYSICIAN: Derenda Mis, MD  SERVICE: HOSPITALIST 4  PRIMARY CARE PHYSICIAN: Cam Hai, FNP   Has PCP been verified with patient and updated? Yes    No lay caregiver identified.      PRIMARY DISCHARGE DIAGNOSIS: SVC syndrome  Active Hospital Problems    Diagnosis Date Noted   . 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 Hypertensive urgency [I16.0] 08/11/2021      Resolved Hospital Problems   No resolved problems to display.     Active Non-Hospital Problems    Diagnosis Date Noted   . Fluid overload 08/11/2021   . Facial swelling 08/11/2021   . Dyspnea 08/05/2021   . Anemia in chronic kidney disease 08/05/2021   . Hypervolemia associated with renal insufficiency 08/05/2021   . Coronary artery disease involving native coronary artery 07/01/2021   . Hypertensive heart disease 07/01/2021   . LVH (left ventricular hypertrophy) 07/01/2021   . Mild aortic stenosis 07/01/2021   . Noncompliance 07/01/2021   . Elevated troponin I level 06/30/2021   . Nausea 06/30/2021   . Congestive heart failure (CMS HCC) 06/30/2021   . Chronic chest pain 06/27/2021   . ESRD (end stage renal disease) on dialysis (CMS [place]) 12/04/2020   . Uncontrolled hypertension 12/04/2020   . Tobacco dependence 12/04/2020   . GERD (gastroesophageal reflux disease) 12/04/2020   . Insomnia 12/04/2020           Current Discharge Medication List      START taking these medications.      Details   acetaminophen 325 mg Tablet  Commonly known as: TYLENOL   650 mg, Oral, EVERY 4 HOURS PRN  Refills: 0     * apixaban 5 mg Tablet  Commonly known as: ELIQUIS   10 mg, Oral, 2 TIMES DAILY  Qty: 10 Tablet  Refills: 0     * apixaban 5 mg Tablet  Commonly known as: ELIQUIS  Start taking on: August 22, 2021   5 mg, Oral, 2 TIMES DAILY  Qty: 60  Tablet  Refills: 0     vancomycin 1,500 mg in NS 15 mL infusion   20 mg/kg (1,500 mg), Intravenous, GIVE IN DIALYSIS, Mix and infuse per policy of Home Infusion Pharmacy.  Qty: 1 Each  Refills: 0         * This list has 2 medication(s) that are the same as other medications prescribed for you. Read the directions carefully, and ask your doctor or other care provider to review them with you.            CONTINUE these medications which have CHANGED during your visit.      Details   dilTIAZem 180 mg Capsule, Sust. Release 24 hr  Commonly known as: CARDIZEM CD  What changed:    medication strength   how much to take   180 mg, Oral, DAILY  Qty: 30 Capsule  Refills: 0     Isosorbide Mononitrate 120 mg Tablet Sustained Release 24 hr  Commonly known as: IMDUR  Start taking on: August 20, 2021  What changed:    medication strength   how much to take   120 mg, Oral, EVERY MORNING  Qty: 30 Tablet  Refills: 0  labetaloL 300 mg Tablet  Commonly known as: NORMODYNE  What changed:    medication strength   how much to take   when to take this   300 mg, Oral, EVERY 12 HOURS  Qty: 60 Tablet  Refills: 0        CONTINUE these medications - NO CHANGES were made during your visit.      Details   albuterol sulfate 90 mcg/actuation oral inhaler  Commonly known as: PROVENTIL or VENTOLIN or PROAIR   1-2 Puffs, Inhalation, EVERY 6 HOURS PRN  Refills: 0     AURYXIA ORAL   1 g, Oral, 3 TIMES DAILY BEFORE MEALS, "Take 2 tablets three times daily before meals and 1 tablet before snacks"  Refills: 0     budesonide-formoteroL 160-4.5 mcg/actuation oral inhaler  Commonly known as: SYMBICORT   2 Puffs, Inhalation, 2 TIMES DAILY  Refills: 0     calcitrioL 0.5 mcg Capsule  Commonly known as: ROCALTROL   1 mcg, Oral, EVERY MO, WE AND FR  Refills: 0     cinacalcet 30 mg Tablet  Commonly known as: SENSIPAR   30 mg, Oral, EVERY EVENING  Refills: 0     cloNIDine 0.3 mg/24 hr Patch Weekly  Commonly known as: CATAPRES-TTS   0.3 mg, Transdermal, EVERY  7 DAYS  Refills: 0     clopidogreL 75 mg Tablet  Commonly known as: PLAVIX   75 mg, Oral, DAILY  Refills: 0     cyclobenzaprine 5 mg Tablet  Commonly known as: FLEXERIL   5 mg, Oral, 2 TIMES DAILY PRN  Refills: 0     doxazosin 4 mg Tablet  Commonly known as: CARDURA   4 mg, Oral, EVERY EVENING  Refills: 0     furosemide 80 mg Tablet  Commonly known as: LASIX   80 mg, Oral, 2 TIMES DAILY  Refills: 0     guaiFENesin 600 mg Tablet Extended Release 12hr  Commonly known as: MUCINEX   600 mg, Oral, EVERY 12 HOURS  Refills: 0     loratadine 10 mg Tablet  Commonly known as: CLARITIN   10 mg, Oral, DAILY  Refills: 0     montelukast 10 mg Tablet  Commonly known as: SINGULAIR   10 mg, Oral, EVERY EVENING  Refills: 0     nitroGLYCERIN 0.4 mg Tablet, Sublingual  Commonly known as: NITROSTAT   0.4 mg, Sublingual, EVERY 5 MIN, for 3 doses over 15 minutes  Qty: 3 Tablet  Refills: 0     ondansetron 4 mg Tablet  Commonly known as: ZOFRAN   4 mg, Oral, EVERY 12 HOURS PRN  Refills: 0     pantoprazole 40 mg Tablet, Delayed Release (E.C.)  Commonly known as: PROTONIX   40 mg, Oral, DAILY  Refills: 0        STOP taking these medications.    amoxicillin-pot clavulanate 500-125 mg Tablet  Commonly known as: AUGMENTIN     aspirin 81 mg Tablet, Delayed Release (E.C.)  Commonly known as: ECOTRIN          Discharge med list refreshed?  YES     Allergies   Allergen Reactions   . Lisinopril Swelling     Tongue and throat swelling     HOSPITAL PROCEDURE(S):   No orders of the defined types were placed in this encounter.    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 She was seen by IR and underwent mechanical thrombectomy as well as exchange of her dialysis line. Of note, she was found to be persistently bacteremic with staph epi. ID was consulted and recommended a 6 week course of IV Vanc with dialysis. She was started on Eliquis for her DVT. At this time, she is stable for discharge home.         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 The patient was restarted on Imdur.     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 Patient does not want this restarted at this time  - Increase labetalol to 300 mg and add Imdur     Coagulase negative staph bacteremia   - Given persistently positive cultures, ID consulted                - Plan for 6 weeks of Vanc                - OPAT consulted, appreciate recs     OSA  - sleep medicine consulted  - wears 2L NC q HS at home  - CPAP qHS ordered  inpatient    PossibleSuicidal Ideations  - psych following, low concern at this time  - DC SI precautions     Chronic back pain  - lidocaine patch, PRN tylenol, flexeril, oxycodone  - pain management consulted  - add 2.5 mg marinol TID    Acute on Chronic NormocyticAnemia  - no current s/s bleeding  - baseline Hgb around 9  - Hgb today 7.2  - transfuse Hgb <7   - patient received ferrlecit on 6/22       Asthma  COPD  Tobacco Use Disorder  - wean oxygen as tolerated  - continue home Symbicort  - continue nicotine patch  - PRN albuterol nebulizers ordered    TRANSITION/POST DISCHARGE CARE/PENDING TESTS/REFERRALS: PCP follow-up     CONDITION ON DISCHARGE:  A. Ambulation: Full ambulation  B. Self-care Ability: Complete  C. Cognitive Status Alert and Oriented x 3  D.  Code status at discharge:       LINES/DRAINS/WOUNDS AT DISCHARGE:   Patient Lines/Drains/Airways Status     Active Line / Dialysis Catheter / Dialysis Graft / Drain / Airway / Wound     Name Placement date Placement time Site Days    Peripheral IV Ultrasound guided Right Forearm 08/12/21  1725  -- 6    AV Fistula Left;Upper Arm 12/05/20  1502  -- 257    Dialysis Catheter Cuffed/Tunneled 08/13/21  1316  -- 6                DISCHARGE DISPOSITION:  Home discharge     DISCHARGE INSTRUCTIONS:  Post-Discharge Follow Up Appointments     Follow up with Infectious Diseases, Phys[facility]    Phone: [phone]    Where: 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    Important drug interactions:    Allergies:  Allergies  Allergen Reactions   Lisinopril Swelling    Tongue and throat swelling          Labs monitoring plan/orders  Labs and Frequency Additional Comments  CBC/diff, Hepatic function panel, Inflammatory CRP, and pre-dialysis vancomycin level Every Monday  BUN and Creatinine at discretion of outpatient nephrologist       OPAT Pharmacist:  Dr. Madolyn Frieze  Phone Number: [phone] Ext: [phone]  Fax Labs To: [phone]    Physician Monitoring OPAT Course:   Dr. Matilde Sprang  Phone Number: [phone]  Fax Labs To: [phone]      ID follow-up:          Attending Physician  Dr. Humphrey Rolls      If there are any changes to antibiotic plan or new culture results after the OPAT note has been placed, please contact us so we can update the OPAT note.      Nils Pyle, PharmD  Infectious Diseases Clinical Pharmacist  OPAT/COpAT/Antimicrobial Stewardship  [phone]          Contact information for lab follow up ([place] Only) - Dr. Murvin Natal / Dr. Matilde Sprang  Fax [phone]    Freedom of Choice: I have informed patient of their freedom of choice with respect to DME providers    Type of Line OTHER (Specify in Comments) dialysis catheter/AV fistula   Line Flushes and Dressing Change Per [place] Protocol YES    Name of Medication/Infusion,  Dose, Route, Frequency, Course Duration Vancomycin 1500 mg IV Every Monday, Wednesday, and Friday after dialysis until 09/25/2021   Please  draw the following labs Vancomycin- CBC/diff, BUN/Cr, Vancomycin level (weekly)    Please draw the following labs Other -  AST, ALT, ALK PHOS, TOTAL BILI (weekly)    Please draw the following labs Other- CRP (weekly)             Copies sent to Care Team       Relationship Specialty Notifications Start End    Deel, Leona Carry, FNP PCP - General NURSE PRACTITIONER  12/04/20     Phone: [phone] Fax: [phone]         [address] [place] [address]          Referring providers can utilize [email or web address] to access their referred [place] patient's information.              I personally spent greater than 30 minutes total on discharge planning, coordination of care, and arranging follow-up.     Melanie Crazier, APRN  Department of Emergency Medicine  Department of [facility] Medicine   Pager [phone]    Derenda Mis, MD

## 2021-08-19 NOTE — Care Management Notes (Signed)
[facility] Management Note    Patient Name: [name]  Date of Birth: 1976/01/25  Sex: female  Date/Time of Admission: 08/12/2021  3:00 PM  Room/Bed: 01/A  Payor: HU[employer] MEDICARE / Plan: HU[employer] CHOICE PPO / Product Type: PPO /    LOS: 7 days   Primary Care Providers:  Cam Hai, FNP, FNP (General)    Admitting Diagnosis:  Hypertensive urgency [I16.0]    Assessment:      08/19/21 1308   Assessment Details   Assessment Type Continued Assessment   Date of Care Management Update 08/19/21   Insurance Information/Type   Insurance type Medicaid   Medicare Intent to Discharge Documentation   Discharge IMM give to: Patient   Discharge IMM Letter Given Date 08/19/21   Discharge IMM Letter Given Time 1307   IMM explained/reviewed with:  Patient;verbalized understanding   Care Management Plan   Discharge Planning Status discharge plan complete   Projected Discharge Date 08/19/21   Discharge plan discussed with: Patient   CM will evaluate for rehabilitation potential yes   Patient choice offered to patient/family no   Form for patient choice reviewed/signed and on chart no   Discharge Needs Assessment   Discharge Facility/Level of Care Needs Home (Patient/Family Member/other)(code 1)       Per service, pt is medically ready for d/c to home, with f/up at outpatient HD clinic, where she will receive IV Vanco after her HD treatments.    MSW notified Virgilio Belling with [employer] of pt's d/c today and need to resume HD treatments at clinic on Wednesday. Virgilio Belling confirmed that they can administer IV Vanco to pt after her HD treatments. MSW faxed [place] order and d/c summary.     MSW met with pt at bedside. Pt is aware and agreeable to d/c to home today. Pt verbalized understanding that she will receive IV Vanco after HD treatments at HD clinic. Pt states that she needs transport home via [facility] care. MSW reviewed d/c IMM with pt and provided copy.     MSW tasked CMA to request that [place]  transport be arranged for 1500.     MSW spoke with [place] with [employer], who confirmed that she received order for IV Vanco and d/c summary.    Discharge Plan:  Home (Patient/Family Member/other) (code 1)    The patient will continue to be evaluated for developing discharge needs.     Case Manager: Catarina Hartshorn, [place]  Phone: [phone]

## 2021-08-19 NOTE — Care Plan (Signed)
Problem: Adult Inpatient Plan of Care  Goal: Plan of Care Review  Outcome: Ongoing (see interventions/notes)  Goal: Patient-Specific Goal (Individualized)  Outcome: Ongoing (see interventions/notes)  Flowsheets (Taken 08/18/2021 2010)  Individualized Care Needs: up ad lib  Anxieties, Fears or Concerns: none at this time  Patient-Specific Goals (Include Timeframe): to go home  Plan of Care Reviewed With: patient  Goal: Absence of Hospital-Acquired Illness or Injury  Outcome: Ongoing (see interventions/notes)  Intervention: Identify and Manage Fall Risk  Recent Flowsheet Documentation  Taken 08/18/2021 2010 by Donato Schultz, RN  Safety Promotion/Fall Prevention:   safety round/check completed   nonskid shoes/slippers when out of bed  Intervention: Prevent Skin Injury  Recent Flowsheet Documentation  Taken 08/19/2021 0605 by Donato Schultz, RN  Body Position:   positioned/repositioned independently   fowlers ( 45-60 degrees)  Taken 08/18/2021 2010 by Donato Schultz, RN  Body Position:   high-fowlers (greater than 60 degrees)   positioned/repositioned independently  Skin Protection: adhesive use limited  Intervention: Prevent and Manage VTE (Venous Thromboembolism) Risk  Recent Flowsheet Documentation  Taken 08/18/2021 2010 by Donato Schultz, RN  VTE Prevention/Management:   ambulation promoted   anticoagulant therapy maintained  Intervention: Prevent Infection  Recent Flowsheet Documentation  Taken 08/18/2021 2010 by Donato Schultz, RN  Infection Prevention:   rest/sleep promoted   promote handwashing  Goal: Optimal Comfort and Wellbeing  Outcome: Ongoing (see interventions/notes)  Intervention: Provide Person-Centered Care  Recent Flowsheet Documentation  Taken 08/18/2021 2010 by Donato Schultz, [place] Relationship/Rapport:   care explained   choices provided   emotional support provided   empathic listening provided   questions answered   questions encouraged   reassurance provided   thoughts/feelings  acknowledged  Goal: Rounds/Family Conference  Outcome: Ongoing (see interventions/notes)     Problem: Fall Injury Risk  Goal: Absence of Fall and Fall-Related Injury  Outcome: Ongoing (see interventions/notes)  Intervention: Promote [place] Documentation  Taken 08/18/2021 2010 by Donato Schultz, RN  Safety Promotion/Fall Prevention:   safety round/check completed   nonskid shoes/slippers when out of bed     Problem: Hemodialysis  Goal: Safe, Effective Therapy Delivery  Outcome: Ongoing (see interventions/notes)  Goal: Effective Tissue Perfusion  Outcome: Ongoing (see interventions/notes)  Goal: Absence of Infection Signs and Symptoms  Outcome: Ongoing (see interventions/notes)  Intervention: Prevent or Manage Infection  Recent Flowsheet Documentation  Taken 08/18/2021 2010 by Donato Schultz, RN  Fever Reduction/Comfort Measures:   lightweight clothing   lightweight bedding  Infection Prevention:   rest/sleep promoted   promote handwashing     Problem: Chronic Kidney Disease  Goal: Optimal Coping with Chronic Illness  Outcome: Ongoing (see interventions/notes)  Goal: Electrolyte Balance  Outcome: Ongoing (see interventions/notes)  Goal: Fluid Balance  Outcome: Ongoing (see interventions/notes)  Intervention: Monitor and Manage Hypervolemia  Recent Flowsheet Documentation  Taken 08/18/2021 2010 by Donato Schultz, RN  Skin Protection: adhesive use limited  Goal: Optimal Functional Ability  Outcome: Ongoing (see interventions/notes)  Intervention: Optimize Functional Ability  Recent Flowsheet Documentation  Taken 08/19/2021 0605 by Donato Schultz, RN  Activity Management: up ad lib  Taken 08/19/2021 0015 by Donato Schultz, RN  Activity Management:   up ad lib   ambulated to bathroom  Taken 08/18/2021 2010 by Donato Schultz, RN  Activity Management: up ad lib  Goal: Absence of Anemia Signs and Symptoms  Outcome: Ongoing (see interventions/notes)  Goal: Optimal Oral Intake  Outcome: Ongoing  (see interventions/notes)  Goal: Acceptable Pain Control  Outcome: Ongoing (see interventions/notes)  Intervention: Prevent or Manage Pain  Recent Flowsheet Documentation  Taken 08/18/2021 2010 by Donato Schultz, RN  Sleep/Rest Enhancement:   consistent schedule promoted   awakenings minimized  Goal: Minimize Renal Failure Effects  Outcome: Ongoing (see interventions/notes)

## 2021-08-19 NOTE — Transitional Care (Signed)
[facility] Medicine      Transition of Care Coordination      PCP Verification      ?          Name: [name]      Date of Service: 08/19/2021           1.Contacted Cam Hai, FNP office to verify establishment and determine barriers to follow-up. Cam Hai, FNP verified as patient's PCP. Last visit: 07/30/21.      2. Does patient have an upcoming appointment scheduled in the next 30 days?: no     3. Appointment availability within 7-14 days of discharge: yes     4. Hospital discharge follow up scheduled :  09/05/21 at 230 pm  Appointment added to AVS or will merge with Discharge if [facility] provider noted    5. PCP agreeable to follow warfarin/INR if applicable: Yes     6. PCP agreeable to follow home health if applicable: Yes     7. PCP office offers designated personnel for care coordination: No     8. Current PCP office information:      Cam Hai, FNP     Phone: [phone]     Fax: [phone]        Is office information correct? Yes          Dawn Hollingshead, LPN

## 2021-08-19 NOTE — Nurses Notes (Signed)
Patient returned to 5E Bed 1 from dialysis.

## 2021-08-20 ENCOUNTER — Telehealth (HOSPITAL_COMMUNITY): Payer: Self-pay

## 2021-08-20 LAB — BPAM PACKED CELL ORDER: UNIT DIVISION: 0

## 2021-08-20 LAB — BLOOD CULTURE (DETERMINE LINE SEPSIS)
BLOOD CULTURE, LINE SEPSIS: NO GROWTH
BLOOD CULTURE, LINE SEPSIS: NO GROWTH

## 2021-08-20 LAB — TYPE AND CROSS RED CELLS - UNITS
ABO/RH(D): O POS
ANTIBODY SCREEN: NEGATIVE
UNITS ORDERED: 1

## 2021-08-20 NOTE — Telephone Encounter (Signed)
Attempted to call patient to review apixaban education.     Voicemail left with [facility] nurse callback information, patient asked to return phone call to review education.         Mat Carne, RN  08/20/2021, at 10:01  Antithrombotic Stewardship Program  Anticoagulation Education Nurse Specialist  Phone: [phone]

## 2021-08-20 NOTE — Telephone Encounter (Signed)
Attempted to call patient to review apixaban education.     Voicemail left on EC's phone with [facility] nurse callback information, patient asked to return phone call to review education.     Patient's phone is not accepting calls at this time.    Mat Carne, RN  08/20/2021, at 15:02  Antithrombotic Stewardship Program  Anticoagulation Education Nurse Specialist  Phone: [phone]

## 2021-08-20 NOTE — Pharmacy (Signed)
[place] / [facility] of Pharm[facility] Services   Antithrombotic Stewardship Program     New Start Oral Anticoagulant Transitions of Care - Pharmacy     Name: Faith Patricelli MRN:  [id number]   Date: 08/12/2021 Age: 46 y.o.       Date of Admission: 08/12/2021    Primary Contact: Patient    Primary Contact Phone Number: [phone]     Alternate Contact:  Garner Nash - Sister  (Name and Relationship)    Alternate Contact Phone Number: [phone]     Oral Anticoagulant Utilized: apixaban    Prescribed Oral Anticoagulant Regimen: Take 2 Tablets (10 mg total) by mouth Twice daily for 5 doses   THEN   Take 1 Tablet (5 mg total) by mouth Twice daily for 30 days    New initiation of oral anticoagulant? New Start    Insurance Provider: [employer]     Prior Authorization Required? No    Medication Cost: $ 0    Patient Assistance Required? No    Preferred Pharmacy: [facility] / [place], [place]    Pamelia Hoit, Pharmacy Technician  08/20/2021, 09:52

## 2021-08-21 ENCOUNTER — Encounter (INDEPENDENT_AMBULATORY_CARE_PROVIDER_SITE_OTHER): Payer: Commercial Managed Care - PPO | Admitting: Nephrology

## 2021-08-21 ENCOUNTER — Telehealth (HOSPITAL_COMMUNITY): Payer: Self-pay

## 2021-08-21 ENCOUNTER — Other Ambulatory Visit: Payer: Self-pay

## 2021-08-21 ENCOUNTER — Encounter (INDEPENDENT_AMBULATORY_CARE_PROVIDER_SITE_OTHER): Payer: Self-pay | Admitting: Nephrology

## 2021-08-23 ENCOUNTER — Encounter (INDEPENDENT_AMBULATORY_CARE_PROVIDER_SITE_OTHER): Payer: Self-pay | Admitting: Student in an Organized Health Care Education/Training Program

## 2021-08-23 NOTE — Progress Notes (Signed)
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

## 2021-08-26 ENCOUNTER — Inpatient Hospital Stay (HOSPITAL_COMMUNITY): Payer: Commercial Managed Care - PPO

## 2021-08-26 ENCOUNTER — Encounter (HOSPITAL_BASED_OUTPATIENT_CLINIC_OR_DEPARTMENT_OTHER): Payer: Self-pay

## 2021-08-26 ENCOUNTER — Emergency Department (HOSPITAL_BASED_OUTPATIENT_CLINIC_OR_DEPARTMENT_OTHER): Payer: Commercial Managed Care - PPO

## 2021-08-26 ENCOUNTER — Inpatient Hospital Stay (HOSPITAL_COMMUNITY): Payer: Commercial Managed Care - PPO | Admitting: Internal Medicine

## 2021-08-26 ENCOUNTER — Inpatient Hospital Stay
Admission: EM | Admit: 2021-08-26 | Discharge: 2021-08-27 | DRG: 291 | Disposition: A | Discharge status: Home / Self Care | Payer: Commercial Managed Care - PPO | Attending: Critical Care Medicine | Admitting: Critical Care Medicine

## 2021-08-26 ENCOUNTER — Other Ambulatory Visit: Payer: Self-pay

## 2021-08-26 DIAGNOSIS — R110 Nausea: Secondary | ICD-10-CM

## 2021-08-26 DIAGNOSIS — R1013 Epigastric pain: Secondary | ICD-10-CM | POA: Diagnosis present

## 2021-08-26 DIAGNOSIS — M4647 Discitis, unspecified, lumbosacral region: Secondary | ICD-10-CM | POA: Diagnosis present

## 2021-08-26 DIAGNOSIS — Z992 Dependence on renal dialysis: Secondary | ICD-10-CM

## 2021-08-26 DIAGNOSIS — R109 Unspecified abdominal pain: Secondary | ICD-10-CM

## 2021-08-26 DIAGNOSIS — M542 Cervicalgia: Secondary | ICD-10-CM | POA: Diagnosis present

## 2021-08-26 DIAGNOSIS — I161 Hypertensive emergency: Secondary | ICD-10-CM | POA: Diagnosis present

## 2021-08-26 DIAGNOSIS — R7881 Bacteremia: Secondary | ICD-10-CM | POA: Diagnosis present

## 2021-08-26 DIAGNOSIS — R079 Chest pain, unspecified: Secondary | ICD-10-CM

## 2021-08-26 DIAGNOSIS — E119 Type 2 diabetes mellitus without complications: Secondary | ICD-10-CM

## 2021-08-26 DIAGNOSIS — Z86711 Personal history of pulmonary embolism: Secondary | ICD-10-CM

## 2021-08-26 DIAGNOSIS — R519 Headache, unspecified: Secondary | ICD-10-CM | POA: Diagnosis present

## 2021-08-26 DIAGNOSIS — F419 Anxiety disorder, unspecified: Secondary | ICD-10-CM | POA: Diagnosis not present

## 2021-08-26 DIAGNOSIS — I132 Hypertensive heart and chronic kidney disease with heart failure and with stage 5 chronic kidney disease, or end stage renal disease: Principal | ICD-10-CM | POA: Diagnosis present

## 2021-08-26 DIAGNOSIS — J45909 Unspecified asthma, uncomplicated: Secondary | ICD-10-CM | POA: Diagnosis present

## 2021-08-26 DIAGNOSIS — Z7982 Long term (current) use of aspirin: Secondary | ICD-10-CM

## 2021-08-26 DIAGNOSIS — G4700 Insomnia, unspecified: Secondary | ICD-10-CM | POA: Diagnosis present

## 2021-08-26 DIAGNOSIS — I252 Old myocardial infarction: Secondary | ICD-10-CM

## 2021-08-26 DIAGNOSIS — F17210 Nicotine dependence, cigarettes, uncomplicated: Secondary | ICD-10-CM | POA: Diagnosis present

## 2021-08-26 DIAGNOSIS — Z86718 Personal history of other venous thrombosis and embolism: Secondary | ICD-10-CM

## 2021-08-26 DIAGNOSIS — Z7901 Long term (current) use of anticoagulants: Secondary | ICD-10-CM

## 2021-08-26 DIAGNOSIS — I119 Hypertensive heart disease without heart failure: Secondary | ICD-10-CM | POA: Diagnosis present

## 2021-08-26 DIAGNOSIS — Z7951 Long term (current) use of inhaled steroids: Secondary | ICD-10-CM

## 2021-08-26 DIAGNOSIS — B957 Other staphylococcus as the cause of diseases classified elsewhere: Secondary | ICD-10-CM | POA: Diagnosis present

## 2021-08-26 DIAGNOSIS — I160 Hypertensive urgency: Secondary | ICD-10-CM | POA: Diagnosis present

## 2021-08-26 DIAGNOSIS — I10 Essential (primary) hypertension: Secondary | ICD-10-CM | POA: Diagnosis present

## 2021-08-26 DIAGNOSIS — I081 Rheumatic disorders of both mitral and tricuspid valves: Secondary | ICD-10-CM | POA: Diagnosis present

## 2021-08-26 DIAGNOSIS — Z7902 Long term (current) use of antithrombotics/antiplatelets: Secondary | ICD-10-CM

## 2021-08-26 DIAGNOSIS — Z79899 Other long term (current) drug therapy: Secondary | ICD-10-CM

## 2021-08-26 DIAGNOSIS — D631 Anemia in chronic kidney disease: Secondary | ICD-10-CM | POA: Diagnosis present

## 2021-08-26 DIAGNOSIS — I150 Renovascular hypertension: Secondary | ICD-10-CM

## 2021-08-26 DIAGNOSIS — G4730 Sleep apnea, unspecified: Secondary | ICD-10-CM | POA: Diagnosis present

## 2021-08-26 DIAGNOSIS — Z8249 Family history of ischemic heart disease and other diseases of the circulatory system: Secondary | ICD-10-CM

## 2021-08-26 DIAGNOSIS — N186 End stage renal disease: Secondary | ICD-10-CM | POA: Diagnosis present

## 2021-08-26 DIAGNOSIS — I5032 Chronic diastolic (congestive) heart failure: Secondary | ICD-10-CM | POA: Diagnosis present

## 2021-08-26 DIAGNOSIS — I2510 Atherosclerotic heart disease of native coronary artery without angina pectoris: Secondary | ICD-10-CM | POA: Diagnosis present

## 2021-08-26 DIAGNOSIS — K219 Gastro-esophageal reflux disease without esophagitis: Secondary | ICD-10-CM | POA: Diagnosis present

## 2021-08-26 DIAGNOSIS — Z87891 Personal history of nicotine dependence: Secondary | ICD-10-CM

## 2021-08-26 LAB — PT/INR
INR: 1.19 — ABNORMAL HIGH (ref 0.88–1.10)
PROTHROMBIN TIME: 12.6 seconds (ref 9.8–12.7)

## 2021-08-26 LAB — CBC WITH DIFF
BASOPHIL #: 0.06 10*3/uL (ref 0.00–0.30)
BASOPHIL %: 0 % (ref 0–3)
EOSINOPHIL #: 0.3 10*3/uL (ref 0.00–0.80)
EOSINOPHIL %: 2 % (ref 0–7)
HCT: 26.6 % — ABNORMAL LOW (ref 37.0–47.0)
HGB: 8.7 g/dL — ABNORMAL LOW (ref 12.5–16.0)
LYMPHOCYTE #: 1.12 10*3/uL (ref 1.10–5.00)
LYMPHOCYTE %: 8 % — ABNORMAL LOW (ref 25–45)
MCH: 30.6 pg (ref 27.0–32.0)
MCHC: 32.8 g/dL (ref 32.0–36.0)
MCV: 93.2 fL (ref 78.0–99.0)
MONOCYTE #: 0.94 10*3/uL (ref 0.00–1.30)
MONOCYTE %: 7 % (ref 0–12)
MPV: 7.9 fL (ref 7.4–10.4)
NEUTROPHIL #: 11.34 10*3/uL — ABNORMAL HIGH (ref 1.80–8.40)
NEUTROPHIL %: 82 % — ABNORMAL HIGH (ref 40–76)
PLATELETS: 261 10*3/uL (ref 140–440)
RBC: 2.85 10*6/uL — ABNORMAL LOW (ref 4.20–5.40)
RDW: 18.9 % — ABNORMAL HIGH (ref 11.6–14.8)
WBC: 13.8 10*3/uL — ABNORMAL HIGH (ref 4.0–10.5)

## 2021-08-26 LAB — COMPREHENSIVE METABOLIC PANEL, NON-FASTING
ALBUMIN/GLOBULIN RATIO: 0.7 — ABNORMAL LOW (ref 0.8–1.4)
ALBUMIN: 3.6 g/dL (ref 3.4–5.0)
ALKALINE PHOSPHATASE: 98 U/L (ref 46–116)
ALT (SGPT): 12 U/L (ref <=78)
ANION GAP: 12 mmol/L (ref 10–20)
AST (SGOT): 17 U/L (ref 15–37)
BILIRUBIN TOTAL: 0.9 mg/dL (ref 0.2–1.0)
BUN/CREA RATIO: 3
BUN: 34 mg/dL — ABNORMAL HIGH (ref 7–18)
CALCIUM, CORRECTED: 10.3 mg/dL
CALCIUM: 9.9 mg/dL (ref 8.5–10.1)
CHLORIDE: 96 mmol/L — ABNORMAL LOW (ref 98–107)
CO2 TOTAL: 29 mmol/L (ref 21–32)
CREATININE: 10.06 mg/dL — ABNORMAL HIGH (ref 0.55–1.02)
ESTIMATED GFR: 4 mL/min/{1.73_m2} — ABNORMAL LOW (ref >59)
GLOBULIN: 5
GLUCOSE: 94 mg/dL (ref 74–106)
OSMOLALITY, CALCULATED: 281 mOsm/kg (ref 270–290)
POTASSIUM: 3.8 mmol/L (ref 3.5–5.1)
PROTEIN TOTAL: 8.6 g/dL — ABNORMAL HIGH (ref 6.4–8.2)
SODIUM: 137 mmol/L (ref 136–145)

## 2021-08-26 LAB — TROPONIN-I
TROPONIN I: 139 ng/L — ABNORMAL HIGH (ref <15)
TROPONIN I: 128 ng/L — ABNORMAL HIGH (ref <15)
TROPONIN I: 143 ng/L — ABNORMAL HIGH (ref <15)

## 2021-08-26 LAB — ECG 12 LEAD
Atrial Rate: 100 {beats}/min
Calculated P Axis: 90 degrees
Calculated R Axis: 78 degrees
Calculated T Axis: 92 degrees
PR Interval: 198 ms
QRS Duration: 104 ms
QT Interval: 374 ms
QTC Calculation: 482 ms
Ventricular rate: 100 {beats}/min

## 2021-08-26 MED ORDER — HYOSCYAMINE 0.125 MG/5 ML ORAL ELIXIR
10.0000 mL | ORAL_SOLUTION | Freq: Once | ORAL | Status: AC
Start: 2021-08-26 — End: 2021-08-26
  Administered 2021-08-26: 10 mL via ORAL

## 2021-08-26 MED ORDER — CYCLOBENZAPRINE 10 MG TABLET
5.0000 mg | ORAL_TABLET | Freq: Two times a day (BID) | ORAL | Status: DC | PRN
Start: 2021-08-26 — End: 2021-08-27

## 2021-08-26 MED ORDER — FENTANYL (PF) 50 MCG/ML INJECTION SOLUTION
75.0000 ug | INTRAMUSCULAR | Status: AC
Start: 2021-08-26 — End: 2021-08-26
  Administered 2021-08-26: 75 ug via INTRAVENOUS

## 2021-08-26 MED ORDER — SODIUM CHLORIDE 0.9 % (FLUSH) INJECTION SYRINGE
3.0000 mL | INJECTION | Freq: Three times a day (TID) | INTRAMUSCULAR | Status: DC
Start: 2021-08-26 — End: 2021-08-27
  Administered 2021-08-26: 3 mL
  Administered 2021-08-26 – 2021-08-27 (×2): 0 mL

## 2021-08-26 MED ORDER — HYDROMORPHONE 2 MG/ML INJECTION WRAPPER
0.5000 mg | INJECTION | Freq: Once | INTRAMUSCULAR | Status: AC
Start: 2021-08-26 — End: 2021-08-26
  Administered 2021-08-26: 0.5 mg via INTRAVENOUS
  Filled 2021-08-26: qty 1

## 2021-08-26 MED ORDER — PANTOPRAZOLE 40 MG TABLET,DELAYED RELEASE
40.0000 mg | DELAYED_RELEASE_TABLET | Freq: Every day | ORAL | Status: DC
Start: 2021-08-26 — End: 2021-08-27
  Administered 2021-08-26 – 2021-08-27 (×2): 40 mg via ORAL
  Filled 2021-08-26 (×2): qty 1

## 2021-08-26 MED ORDER — ALUMINUM-MAG HYDROXIDE-SIMETHICONE 200 MG-200 MG-20 MG/5 ML ORAL SUSP
ORAL | Status: AC
Start: 2021-08-26 — End: 2021-08-26
  Filled 2021-08-26: qty 30

## 2021-08-26 MED ORDER — ALUMINUM-MAG HYDROXIDE-SIMETHICONE 200 MG-200 MG-20 MG/5 ML ORAL SUSP
30.0000 mL | Freq: Once | ORAL | Status: AC
Start: 2021-08-26 — End: 2021-08-26
  Administered 2021-08-26: 30 mL via ORAL

## 2021-08-26 MED ORDER — CINACALCET 30 MG TABLET
30.0000 mg | ORAL_TABLET | Freq: Every evening | ORAL | Status: DC
Start: 2021-08-26 — End: 2021-08-27
  Administered 2021-08-26: 30 mg via ORAL
  Filled 2021-08-26: qty 1

## 2021-08-26 MED ORDER — FUROSEMIDE 40 MG TABLET
160.0000 mg | ORAL_TABLET | Freq: Every day | ORAL | Status: DC
Start: 2021-08-26 — End: 2021-08-27
  Administered 2021-08-26 – 2021-08-27 (×2): 160 mg via ORAL
  Filled 2021-08-26 (×2): qty 4

## 2021-08-26 MED ORDER — ONDANSETRON HCL (PF) 4 MG/2 ML INJECTION SOLUTION
INTRAMUSCULAR | Status: AC
Start: 2021-08-26 — End: 2021-08-26
  Filled 2021-08-26: qty 2

## 2021-08-26 MED ORDER — ACETAMINOPHEN 325 MG TABLET
ORAL_TABLET | ORAL | Status: AC
Start: 2021-08-26 — End: 2021-08-26
  Filled 2021-08-26: qty 3

## 2021-08-26 MED ORDER — HYDROMORPHONE 2 MG/ML INJECTION WRAPPER
0.5000 mg | INJECTION | INTRAMUSCULAR | Status: DC
Start: 2021-08-26 — End: 2021-08-27
  Administered 2021-08-26: 0 mg via INTRAVENOUS
  Filled 2021-08-26: qty 1

## 2021-08-26 MED ORDER — CALCITRIOL 0.25 MCG CAPSULE
1.0000 ug | ORAL_CAPSULE | ORAL | Status: DC
Start: 2021-08-28 — End: 2021-08-27

## 2021-08-26 MED ORDER — VANCOMYCIN 10 GRAM INTRAVENOUS SOLUTION
20.0000 mg/kg | INTRAVENOUS | Status: DC
Start: 2021-08-26 — End: 2021-08-27
  Filled 2021-08-26: qty 15

## 2021-08-26 MED ORDER — NITROGLYCERIN 0.4 MG SUBLINGUAL TABLET
0.4000 mg | SUBLINGUAL_TABLET | SUBLINGUAL | Status: AC
Start: 2021-08-26 — End: 2021-08-26
  Administered 2021-08-26: 0.4 mg via SUBLINGUAL

## 2021-08-26 MED ORDER — LABETALOL 200 MG TABLET
300.0000 mg | ORAL_TABLET | Freq: Two times a day (BID) | ORAL | Status: DC
Start: 2021-08-26 — End: 2021-08-27
  Administered 2021-08-26 – 2021-08-27 (×2): 300 mg via ORAL
  Filled 2021-08-26 (×2): qty 2

## 2021-08-26 MED ORDER — HYOSCYAMINE 0.125 MG/5 ML ORAL ELIXIR
ORAL_SOLUTION | ORAL | Status: AC
Start: 2021-08-26 — End: 2021-08-26
  Filled 2021-08-26: qty 5

## 2021-08-26 MED ORDER — MONTELUKAST 10 MG TABLET
10.0000 mg | ORAL_TABLET | Freq: Every evening | ORAL | Status: DC
Start: 2021-08-26 — End: 2021-08-27
  Administered 2021-08-26: 10 mg via ORAL
  Filled 2021-08-26: qty 1

## 2021-08-26 MED ORDER — SODIUM CHLORIDE 0.9 % INTRAVENOUS SOLUTION
7.5000 mg/h | INTRAVENOUS | Status: DC
Start: 2021-08-26 — End: 2021-08-27
  Administered 2021-08-26: 10 mg/h via INTRAVENOUS
  Administered 2021-08-26: 7.5 mg/h via INTRAVENOUS
  Administered 2021-08-26: 10 mg/h via INTRAVENOUS
  Administered 2021-08-26: 5 mg/h via INTRAVENOUS
  Administered 2021-08-26: 2.5 mg/h via INTRAVENOUS
  Administered 2021-08-26 (×3): 10 mg/h via INTRAVENOUS
  Administered 2021-08-27: 7.5 mg/h via INTRAVENOUS
  Administered 2021-08-27: 2.5 mg/h via INTRAVENOUS
  Administered 2021-08-27: 5 mg/h via INTRAVENOUS
  Administered 2021-08-27: 0 mg/h via INTRAVENOUS
  Administered 2021-08-27 (×2): 10 mg/h via INTRAVENOUS
  Filled 2021-08-26 (×24): qty 10

## 2021-08-26 MED ORDER — IOHEXOL 350 MG IODINE/ML INTRAVENOUS SOLUTION
100.0000 mL | INTRAVENOUS | Status: AC
Start: 2021-08-26 — End: 2021-08-26
  Administered 2021-08-26: 100 mL via INTRAVENOUS

## 2021-08-26 MED ORDER — ONDANSETRON HCL 4 MG TABLET
4.0000 mg | ORAL_TABLET | Freq: Two times a day (BID) | ORAL | Status: DC | PRN
Start: 2021-08-26 — End: 2021-08-27
  Filled 2021-08-26: qty 1

## 2021-08-26 MED ORDER — GUAIFENESIN ER 600 MG TABLET, EXTENDED RELEASE 12 HR
600.0000 mg | EXTENDED_RELEASE_TABLET | Freq: Two times a day (BID) | ORAL | Status: DC
Start: 2021-08-26 — End: 2021-08-27
  Administered 2021-08-26 – 2021-08-27 (×2): 600 mg via ORAL
  Filled 2021-08-26 (×2): qty 1

## 2021-08-26 MED ORDER — HYDRALAZINE 20 MG/ML INJECTION SOLUTION
INTRAMUSCULAR | Status: AC
Start: 2021-08-26 — End: 2021-08-26
  Filled 2021-08-26: qty 1

## 2021-08-26 MED ORDER — BUDESONIDE-FORMOTEROL HFA 160 MCG-4.5 MCG/ACTUATION AEROSOL INHALER
2.0000 | INHALATION_SPRAY | Freq: Two times a day (BID) | RESPIRATORY_TRACT | Status: DC
Start: 2021-08-26 — End: 2021-08-27
  Administered 2021-08-26 – 2021-08-27 (×2): 2 via RESPIRATORY_TRACT
  Filled 2021-08-26: qty 12

## 2021-08-26 MED ORDER — CLOPIDOGREL 75 MG TABLET
75.0000 mg | ORAL_TABLET | Freq: Every day | ORAL | Status: DC
Start: 2021-08-26 — End: 2021-08-27
  Administered 2021-08-26 – 2021-08-27 (×2): 75 mg via ORAL
  Filled 2021-08-26 (×2): qty 1

## 2021-08-26 MED ORDER — ACETAMINOPHEN 325 MG TABLET
650.0000 mg | ORAL_TABLET | ORAL | Status: DC | PRN
Start: 2021-08-26 — End: 2021-08-27

## 2021-08-26 MED ORDER — ISOSORBIDE MONONITRATE ER 60 MG TABLET,EXTENDED RELEASE 24 HR
120.0000 mg | ORAL_TABLET | Freq: Every morning | ORAL | Status: DC
Start: 2021-08-27 — End: 2021-08-27
  Administered 2021-08-27: 120 mg via ORAL
  Filled 2021-08-26: qty 2

## 2021-08-26 MED ORDER — SODIUM CHLORIDE 0.9 % (FLUSH) INJECTION SYRINGE
3.0000 mL | INJECTION | INTRAMUSCULAR | Status: DC | PRN
Start: 2021-08-26 — End: 2021-08-27

## 2021-08-26 MED ORDER — LIDOCAINE HCL 2 % MUCOSAL SOLUTION
15.0000 mL | Freq: Once | Status: DC
Start: 2021-08-26 — End: 2021-08-27
  Administered 2021-08-26: 0 mL via ORAL

## 2021-08-26 MED ORDER — LORAZEPAM 1 MG TABLET
1.0000 mg | ORAL_TABLET | Freq: Every evening | ORAL | Status: DC | PRN
Start: 2021-08-26 — End: 2021-08-27
  Administered 2021-08-26: 1 mg via ORAL
  Filled 2021-08-26: qty 1

## 2021-08-26 MED ORDER — ALBUTEROL SULFATE HFA 90 MCG/ACTUATION AEROSOL INHALER
2.0000 | INHALATION_SPRAY | Freq: Four times a day (QID) | RESPIRATORY_TRACT | Status: DC | PRN
Start: 2021-08-26 — End: 2021-08-27

## 2021-08-26 MED ORDER — CLONIDINE 0.2 MG/24 HR WEEKLY TRANSDERMAL PATCH
0.3000 mg | MEDICATED_PATCH | TRANSDERMAL | Status: DC
Start: 2021-08-30 — End: 2021-08-27

## 2021-08-26 MED ORDER — NITROGLYCERIN 50 MG/250 ML (200 MCG/ML) IN 5 % DEXTROSE INTRAVENOUS
10.0000 ug/min | INTRAVENOUS | Status: DC
Start: 2021-08-26 — End: 2021-08-27
  Administered 2021-08-26: 40 ug/min via INTRAVENOUS
  Administered 2021-08-26: 15 ug/min via INTRAVENOUS
  Administered 2021-08-26 (×2): 30 ug/min via INTRAVENOUS
  Administered 2021-08-26: 15 ug/min via INTRAVENOUS
  Administered 2021-08-26 (×2): 0 ug/min via INTRAVENOUS
  Administered 2021-08-26: 10 ug/min via INTRAVENOUS
  Administered 2021-08-26: 20 ug/min via INTRAVENOUS
  Administered 2021-08-26: 35 ug/min via INTRAVENOUS
  Administered 2021-08-26: 10 ug/min via INTRAVENOUS
  Administered 2021-08-26: 25 ug/min via INTRAVENOUS
  Administered 2021-08-26: 35 ug/min via INTRAVENOUS

## 2021-08-26 MED ORDER — ACETAMINOPHEN 325 MG TABLET
975.0000 mg | ORAL_TABLET | ORAL | Status: AC
Start: 2021-08-26 — End: 2021-08-26
  Administered 2021-08-26: 975 mg via ORAL

## 2021-08-26 MED ORDER — DOXAZOSIN 4 MG TABLET
4.0000 mg | ORAL_TABLET | Freq: Every evening | ORAL | Status: DC
Start: 2021-08-26 — End: 2021-08-27
  Administered 2021-08-26: 4 mg via ORAL
  Filled 2021-08-26: qty 1

## 2021-08-26 MED ORDER — APIXABAN 5 MG TABLET
5.0000 mg | ORAL_TABLET | Freq: Two times a day (BID) | ORAL | Status: DC
Start: 2021-08-26 — End: 2021-08-27
  Administered 2021-08-26 – 2021-08-27 (×2): 5 mg via ORAL
  Filled 2021-08-26 (×2): qty 1

## 2021-08-26 MED ORDER — MORPHINE 2 MG/ML INJECTION WRAPPER
2.0000 mg | INJECTION | INTRAMUSCULAR | Status: AC
Start: 2021-08-26 — End: 2021-08-26
  Administered 2021-08-26: 2 mg via INTRAVENOUS
  Filled 2021-08-26: qty 1

## 2021-08-26 MED ORDER — LORATADINE 10 MG TABLET
10.0000 mg | ORAL_TABLET | Freq: Every day | ORAL | Status: DC
Start: 2021-08-26 — End: 2021-08-27
  Administered 2021-08-26 – 2021-08-27 (×2): 10 mg via ORAL
  Filled 2021-08-26 (×2): qty 1

## 2021-08-26 MED ORDER — ASPIRIN 81 MG CHEWABLE TABLET
324.0000 mg | CHEWABLE_TABLET | ORAL | Status: AC
Start: 2021-08-26 — End: 2021-08-26
  Administered 2021-08-26: 324 mg via ORAL

## 2021-08-26 MED ORDER — HYDRALAZINE 20 MG/ML INJECTION SOLUTION
20.0000 mg | INTRAMUSCULAR | Status: AC
Start: 2021-08-26 — End: 2021-08-26
  Administered 2021-08-26: 20 mg via INTRAVENOUS

## 2021-08-26 MED ORDER — ONDANSETRON HCL (PF) 4 MG/2 ML INJECTION SOLUTION
4.0000 mg | Freq: Three times a day (TID) | INTRAMUSCULAR | Status: DC | PRN
Start: 2021-08-26 — End: 2021-08-27
  Administered 2021-08-26: 4 mg via INTRAVENOUS
  Filled 2021-08-26: qty 2

## 2021-08-26 MED ORDER — VANCOMYCIN IV - PHARMACIST TO DOSE PER PROTOCOL
Freq: Every day | Status: DC | PRN
Start: 2021-08-26 — End: 2021-08-27

## 2021-08-26 MED ORDER — ONDANSETRON HCL (PF) 4 MG/2 ML INJECTION SOLUTION
4.0000 mg | INTRAMUSCULAR | Status: AC
Start: 2021-08-26 — End: 2021-08-26
  Administered 2021-08-26: 4 mg via INTRAVENOUS

## 2021-08-26 MED ORDER — FENTANYL (PF) 50 MCG/ML INJECTION SOLUTION
INTRAMUSCULAR | Status: AC
Start: 2021-08-26 — End: 2021-08-26
  Filled 2021-08-26: qty 2

## 2021-08-26 MED ORDER — NITROGLYCERIN 0.4 MG SUBLINGUAL TABLET
SUBLINGUAL_TABLET | SUBLINGUAL | Status: AC
Start: 2021-08-26 — End: 2021-08-26
  Filled 2021-08-26: qty 1

## 2021-08-26 MED ORDER — NITROGLYCERIN 0.4 MG SUBLINGUAL TABLET
SUBLINGUAL_TABLET | SUBLINGUAL | Status: AC
Start: 2021-08-26 — End: 2021-08-26
  Filled 2021-08-26: qty 2

## 2021-08-26 MED ORDER — ASPIRIN 81 MG CHEWABLE TABLET
CHEWABLE_TABLET | ORAL | Status: AC
Start: 2021-08-26 — End: 2021-08-26
  Filled 2021-08-26: qty 4

## 2021-08-26 MED ORDER — NITROGLYCERIN 50 MG/250 ML (200 MCG/ML) IN 5 % DEXTROSE INTRAVENOUS
INTRAVENOUS | Status: AC
Start: 2021-08-26 — End: 2021-08-26
  Filled 2021-08-26: qty 250

## 2021-08-26 NOTE — ED Nurses Note (Signed)
PT sleeping when entering room. States pain is 4-5/10, has seems just become a dull ache, but still staying at 4 or 5 at this time for a while. Still titrating meds (nitro up slowly).

## 2021-08-26 NOTE — Consults (Signed)
[facility]   Nephrology Consult      [name], [name], 46 y.o. female  Encounter Start Date:  08/26/2021  Inpatient Admission Date:  08/26/2021  Date of Birth:  27-Sep-1975    Admitting Provider:  Critical care team    Reason for Consult:  End-stage renal disease on hemodialysis    HPI:  [name] is a 46 year old female who presented to the ED at [facility] with headache and chest pain. She reports she has had a headache for the past 3-4 days; she reports associated blurry vision. She reports she was sitting in a chair this morning when she developed mid/left chest pain radiating to neck and back. She reports she has been having increasing chest pain with minimal exertion, and that she cannot walk more than 4 steps without developing chest pain and dyspnea. 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 She was transferred to [facility] where she underwent thrombectomy and exchange of catheter. Blood cultures were found to be positive for Staph epidermidis, and she was started on 6 wk course of vancomycin with dialysis as well as apixaban.  Patient known to have end-stage renal disease hemodialysis Monday Wednesday Friday so so patient was dialyzed today ICU with ultrafiltration 2 L tolerated dialysis very well continue dialysis as scheduled Monday Wednesday Friday anemia of chronic kidney disease check PTH anemia profile treatment per protocol hypertension uncontrolled on IV drip per ICU team medications may need  to be adjusted to control her blood pressure continue to monitor    Past Medical History:   Diagnosis Date   . Asthma    . Chronic diastolic CHF (congestive heart failure) (CMS HCC)    . Esophageal reflux    . ESRD (end stage renal disease) (CMS [place])    . History of anemia due to CKD    . Hypertension    . MI (myocardial infarction) (CMS [place])    . Mitral valve regurgitation    . Pulmonary  edema    . Sleep apnea    . Tricuspid valve regurgitation          Medications Prior to Admission     Prescriptions    acetaminophen (TYLENOL) 325 mg Oral Tablet    Take 2 Tablets (650 mg total) by mouth Every 4 hours as needed    albuterol sulfate (PROVENTIL OR VENTOLIN OR PROAIR) 90 mcg/actuation Inhalation oral inhaler    Take 1-2 Puffs by inhalation Every 6 hours as needed    apixaban (ELIQUIS) 5 mg Oral Tablet    Take 2 Tablets (10 mg total) by mouth Twice daily for 5 doses    apixaban (ELIQUIS) 5 mg Oral Tablet    Take 1 Tablet (5 mg total) by mouth Twice daily for 30 days    budesonide-formoteroL (SYMBICORT) 160-4.5 mcg/actuation Inhalation oral inhaler    Take 2 Puffs by inhalation Twice daily    calcitrioL (ROCALTROL) 0.5 mcg Oral Capsule    Take 2 Capsules (1 mcg total) by mouth Every Monday, Wednesday and Friday    cinacalcet (SENSIPAR) 30 mg Oral Tablet    Take 1 Tablet (30 mg total) by mouth Every evening    cloNIDine (CATAPRES-TTS) 0.3 mg/24 hr Transdermal Patch Weekly    Place 1 Patch (0.3 mg total) on the skin Every 7 days    clopidogreL (PLAVIX) 75 mg Oral Tablet    Take 1  Tablet (75 mg total) by mouth Once a day    cyclobenzaprine (FLEXERIL) 5 mg Oral Tablet    Take 1 Tablet (5 mg total) by mouth Twice per day as needed for Muscle spasms    dilTIAZem (CARDIZEM CD) 180 mg Oral Capsule, Sust. Release 24 hr    Take 1 Capsule (180 mg total) by mouth Once a day for 30 days    doxazosin (CARDURA) 4 mg Oral Tablet    Take 1 Tablet (4 mg total) by mouth Every evening    ferric citrate (AURYXIA ORAL)    Take 1 g by mouth Three times daily before meals "Take 2 tablets three times daily before meals and 1 tablet before snacks"    furosemide (LASIX) 80 mg Oral Tablet    Take 2 Tablets (160 mg total) by mouth Once a day Take after dialysis on dialysis days; otherwise take in morning daily.    guaiFENesin (MUCINEX) 600 mg Oral Tablet Extended Release 12hr    Take 1 Tablet (600 mg total) by mouth Every 12 hours     isosorbide mononitrate (IMDUR) 120 mg Oral Tablet Sustained Release 24 hr    Take 1 Tablet (120 mg total) by mouth Every morning for 30 days    labetaloL (NORMODYNE) 300 mg Oral Tablet    Take 1 Tablet (300 mg total) by mouth Every 12 hours for 30 days    loratadine (CLARITIN) 10 mg Oral Tablet    Take 1 Tablet (10 mg total) by mouth Once a day    montelukast (SINGULAIR) 10 mg Oral Tablet    Take 1 Tablet (10 mg total) by mouth Every evening    nitroGLYCERIN (NITROSTAT) 0.4 mg Sublingual Tablet, Sublingual    Place 1 Tablet (0.4 mg total) under the tongue Every 5 minutes for 3 doses for 3 doses over 15 minutes    ondansetron (ZOFRAN) 4 mg Oral Tablet    Take 1 Tablet (4 mg total) by mouth Every 12 hours as needed for Nausea/Vomiting    pantoprazole (PROTONIX) 40 mg Oral Tablet, Delayed Release (E.C.)    Take 1 Tablet (40 mg total) by mouth Once a day    vancomycin 1,500 mg in NS 15 mL infusion    1,500 mg Give in Dialysis Mix and infuse per policy of [facility].        acetaminophen (TYLENOL) tablet, 650 mg, Oral, Q4H PRN  albuterol 90 mcg per inhalation oral inhaler - "Respiratory to administer", 2 Puff, Inhalation, Q6H PRN  apixaban (ELIQUIS) tablet, 5 mg, Oral, 2x/day  budesonide-formoterol (SYMBICORT) 160 mcg-4.5 mcg per inhalation oral inhaler - "Respiratory to administer", 2 Puff, Inhalation, 2x/day  [START ON 08/28/2021] calcitriol (ROCALTROL) capsule, 1 mcg, Oral, M, W, and F  cinacalcet (SENSIPAR) tablet, 30 mg, Oral, QPM  [START ON 08/30/2021] cloNIDine (CATAPRES-TTS) transdermal patch 0.3 mg, 0.3 mg, Transdermal, Q7 Days  clopidogrel (PLAVIX) 75 mg tablet, 75 mg, Oral, Daily  cyclobenzaprine (FLEXERIL) tablet, 5 mg, Oral, 2x/day PRN  doxazosin (CARDURA) tablet, 4 mg, Oral, QPM  furosemide (LASIX) tablet, 160 mg, Oral, Daily  guaiFENesin (MUCINEX) extended release tablet - for cough (expectorant), 600 mg, Oral, 2x/day  HYDROmorphone (DILAUDID) 2 mg/mL injection, 0.5 mg, Intravenous, Now  [START  ON 08/27/2021] isosorbide mononitrate (IMDUR) 24 hr extended release tablet, 120 mg, Oral, QAM  labetalol (NORMODYNE) tablet, 300 mg, Oral, 2x/day  lidocaine (XYLOCAINE) 2% oral topical viscous solution, 15 mL, Oral, Once  loratadine (CLARITIN) tablet, 10 mg, Oral, Daily  LORazepam (ATIVAN) tablet, 1 mg, Oral, HS PRN  montelukast (SINGULAIR) 10 mg tablet, 10 mg, Oral, QPM  niCARdipine (CARDENE) 25 mg in NS 250 mL (tot vol) infusion, 7.5 mg/hr, Intravenous, Continuous  nitroGLYCERIN 50 mg in 250 ml D5W premix infusion, 10 mcg/min, Intravenous, Continuous  NS flush syringe, 3 mL, Intracatheter, Q8HRS  NS flush syringe, 3 mL, Intracatheter, Q1H PRN  ondansetron (ZOFRAN) 2 mg/mL injection, 4 mg, Intravenous, Q8H PRN  ondansetron (ZOFRAN) tablet, 4 mg, Oral, Q12H PRN  pantoprazole (PROTONIX) delayed release tablet, 40 mg, Oral, Daily  vancomycin (VANCOCIN) 1,500 mg in NS 500 mL IVPB, 20 mg/kg (Adjusted), Intravenous, Give in Dialysis  Vancomycin IV - Pharmacist to Dose per Protocol, , Does not apply, Daily PRN      Allergies   Allergen Reactions   . Lisinopril Swelling     Tongue and throat swelling       ROS:   Not done  EXAM:  Not done  Filed Vitals:    08/26/21 1815 08/26/21 1830 08/26/21 1900 08/26/21 2100   BP: (!) 156/96 (!) 167/99 (!) 168/95 (!) 156/98   Pulse: 92 95 95 (!) 105   Resp: 19 (!) 22 (!) 11    Temp:       SpO2: 95% 95% 96%        Constitutional: Alert and Oriented time person and place . Not in acute distress  HEENT : Vision and hearing grossly normal   Eyes: Conjunctiva clear., Pupils equal and round, reactive to light and accomodation. , Sclera non-icteric.   ENT: Nose without erythema. , Mouth mucous membranes moist.   Neck: JVD negative no thyromegaly or lymphadenopathy and supple, symmetrical, trachea midline  Respiratory: Clear to auscultation bilaterally. No wheezes, No rales, Good air exchange bilaterally  Cardiovascular: regular rate and rhythm no murmurs no rub  Gastrointestinal: Soft,  non-tender, Bowel sounds normal, No hepatosplenomegaly  Genitourinary: no suprapubic tenderness  Lower Extremities : No edema . Pulses + 4 bilateral   Neurologic: Grossly normal. Speech clear.   Psychiatric: Normal mood and affect,   Labs:         CBC Results Differential Results   Recent Labs     08/26/21  0425   WBC 13.8*   HGB 8.7*   HCT 26.6*    Recent Labs     08/26/21  0425   PMNS 82*   LYMPHOCYTES 8*   MONOCYTES 7   EOSINOPHIL 2   BASOPHILS 0  0.06   PMNABS 11.34*   LYMPHSABS 1.12   MONOSABS 0.94   EOSABS 0.30      BMP Results Other Chemistries Results   Recent Labs     08/26/21  0425   SODIUM 137   POTASSIUM 3.8   CHLORIDE 96*   CO2 29   BUN 34*   CREATININE 10.06*   GFR 4*   ANIONGAP 12    Recent Labs     08/26/21  0425   CALCIUM 9.9   ALBUMIN 3.6      Liver/Pancreas Enzyme Results Blood Gas     Recent Labs     08/26/21  0425   TOTALPROTEIN 8.6*   ALBUMIN 3.6   AST 17   ALT 12   ALKPHOS 98    No results found for this encounter   Cardiac Results    Coags Results     TROPONIN I  Lab Results   Component Value Date    UHCEASTTROPI 0.06 ([place]) 12/13/2020  TROPONINI 143 (H) 08/26/2021    TROPONINI 128 (H) 08/26/2021    TROPONINI 139 (H) 08/26/2021         Recent Labs     08/26/21  0425   INR 1.19*          Imaging Studies:    CT ANGIO CHEST ABDOMEN W CONTRAST   Final Result   1. NO EVIDENCE OF THORACIC AORTIC ANEURYSM OR THORACIC AORTIC DISSECTION.   2. NO EVIDENCE OF PULMONARY EMBOLISM.   3. PULMONARY CONGESTIVE CHANGES AND LIKELY MILD DEGREE OF EDEMA.   4. NO ABDOMINAL AORTIC ANEURYSM OR EVIDENCE OF ABDOMINAL AORTIC DISSECTION.   5. SEVERE DIFFUSE ATHEROSCLEROTIC DISEASE.   6. 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 PLEASE CORRELATE WITH CLINICAL FINDINGS AND PATIENT HISTORY. MRI OF THE LUMBAR SPINE COULD BE CONSIDERED FOR FURTHER EVALUATION IF NEEDED.   7. SMALL AMOUNT OF LOCALIZED SUBCUTANEOUS GRAYING AND FAT STRANDING AT THE RIGHT INTERNAL JUGULAR CENTRAL VENOUS CATHETER  SKIN ENTRY SITE. PLEASE CORRELATE WITH CLINICAL FINDINGS TO EXCLUDE CELLULITIS/INFECTION.         One or more dose reduction techniques were used (e.g., Automated exposure control, adjustment of the mA and/or kV according to patient size, use of iterative reconstruction technique).         Radiologist location ID: [id number]         CT BRAIN WO IV CONTRAST   Final Result   1.NO ACUTE FINDINGS   2.THERE ARE MULTIPLE LOW-ATTENUATION AREAS IN THE CEREBRAL WHITE MATTER WHICH WERE PRESENT ON 09/02/2020 BUT ARE NONSPECIFIC AS TO THE ETIOLOGY. THESE ARE ABNORMAL FINDINGS IN A 79-YEAR-OLD PATIENT. THIS COULD BE BETTER EVALUATED WITH MRI THE BRAIN IF CLINICALLY INDICATED   3.THE LEFT MASTOID AIR CELLS ARE OPACIFIED WHICH REPRESENTS INTERVAL WORSENING COMPARED TO 09/02/2020.         One or more dose reduction techniques were used (e.g., Automated exposure control, adjustment of the mA and/or kV according to patient size, use of iterative reconstruction technique).         Radiologist location ID: [id number]         XR AP MOBILE CHEST   Final Result      FINDINGS COMPATIBLE WITH VOLUME OVERLOAD/EARLY CHF.         Radiologist location ID: [id number]              Assessment/Plan:  Active Hospital Problems    Diagnosis   . Primary Problem: Chest pain in adult   . Hypertensive urgency   . Hypertensive heart disease   . ESRD (end stage renal disease) on dialysis (CMS HCC)   . Uncontrolled hypertension      End-stage renal disease hemodialysis Monday Wednesday Friday dialyzed today tolerated dialysis very well ultrafiltration 2 L continue dialysis as scheduled  Anemia of chronic kidney disease check PTH anemia profile treatment per protocol  Hypertensive urgency on IV drip pressure medicines per ICU team      Richardean Sale, MD

## 2021-08-26 NOTE — ED Provider Notes (Signed)
[facility], [facility] Emergency Department  ED Primary Provider Note  HPI:  [name] is a 46 y.o. female     Patient complains of chest pain abdominal pain.  Symptoms started about 3 hours ago.  Pain is localized primarily to the left side of her chest spent 8/10 nonradiating.  She states she is just tired of feeling unwell.  She is not particularly short of breath.  Endorses nausea no vomiting.  Patient has a complex medical history.  Review of chart namely discharge summary 6/26 shows patient was recently admitted to [facility] and treated for SVC syndrome and hypertensive urgency.  She was seen by iron at underwent mechanical thrombectomy and exchange of her dialysis line.  Patient was found to be persistently positive for staph epidermidis and is now on IV vancomycin.  She is also on Eliquis for her DVT.  Patient does have a known history of CAD.  She is ESRD dialysis dependent Monday Wednesday Friday she is not missed dialysis recently.  He states he has been compliant with all medications including her anticoagulation.  Patient states she stopped smoking 2 weeks ago.  She is COVID vaccinated.  Full code.    ROS review and negative aside from stated in HPI.    Physical Exam:  ED Triage Vitals [08/26/21 0405]   BP (Non-Invasive) (!) 230/144   Heart Rate 98   Respiratory Rate (!) 24   Temperature 36.2 C (97.1 F)   SpO2 100 %   Weight 86.2 kg (190 lb)   Height 1.727 m (5' 8" )     No acute distress.  Appears chronically ill Patient awake alert oriented x3.  Mood is appropriate.  Pupils 3 mm equal round reactive.  Extraocular movements are intact.  Oropharynx is clear.  Mucous membranes moist.  Trachea midline.  Neck is supple.  Heart has regular rate and rhythm without significant murmurs rubs or gallops.  Lungs are clear to auscultation.  Abdomen soft moderately tender to the epigastrium, nondistended.  Moving all extremities without difficulty.  No rash no  edema.      Patient data:  Labs Ordered/Reviewed   COMPREHENSIVE METABOLIC PANEL, NON-FASTING - Abnormal; Notable for the following components:       Result Value    CHLORIDE 96 (*)     BUN 34 (*)     CREATININE 10.06 (*)     ESTIMATED GFR 4 (*)     PROTEIN TOTAL 8.6 (*)     ALBUMIN/GLOBULIN RATIO 0.7 (*)     All other components within normal limits    Narrative:     Estimated Glomerular Filtration Rate (eGFR) is calculated using the CKD-EPI (2021) equation, intended for patients 15 years of age and older. If gender is not documented or "unknown", there will be no eGFR calculation.   TROPONIN-I - Abnormal; Notable for the following components:    TROPONIN I 139 (*)     All other components within normal limits    Narrative:     Values received on females ranging between 12-15 ng/L MUST include the next serial troponin to review changes in the delta differences as the reference range for the Access II chemistry analyzer is lower than the established reference range.     TROPONIN-I - Abnormal; Notable for the following components:    TROPONIN I 128 (*)     All other components within normal limits    Narrative:     Values received on  females ranging between 12-15 ng/L MUST include the next serial troponin to review changes in the delta differences as the reference range for the Access II chemistry analyzer is lower than the established reference range.     CBC WITH DIFF - Abnormal; Notable for the following components:    WBC 13.8 (*)     RBC 2.85 (*)     HGB 8.7 (*)     HCT 26.6 (*)     RDW 18.9 (*)     NEUTROPHIL % 82 (*)     LYMPHOCYTE % 8 (*)     NEUTROPHIL # 11.34 (*)     All other components within normal limits   PT/INR - Abnormal; Notable for the following components:    INR 1.19 (*)     All other components within normal limits   CBC/DIFF    Narrative:     The following orders were created for panel order CBC/DIFF.  Procedure                               Abnormality         Status                      ---------                               -----------         ------                     CBC WITH DIFF[[id number]]                Abnormal            Final result                 Please view results for these tests on the individual orders.   TROPONIN-I     XR AP MOBILE CHEST   Final Result by Edi, [facility] In (07/03 6168)      FINDINGS COMPATIBLE WITH VOLUME OVERLOAD/EARLY CHF.         Radiologist location ID: [id number]           EKG read by me shows a sinus rhythm with a rate of 100, normal axis, QTC is 482.  I do not appreciate significant ST or T-wave changes no delta waves are S1 Q3 T3 pattern.    MDM:  Chest pain/abdominal pain.  DX includes ACS arrhythmia pneumonia CHF exacerbation, gastritis, .  Patient hypertensive to 230/144.  EKG nonischemic.  Chest x-ray consistent with moderate CHF.  Patient's troponin is elevated at 139 relatively flat repeat at 1:28 a.m..  This is a little bit up from her historic baseline of approximally 100.  Chronic anemia noted.  Remainder screening labs are consistent with end-stage renal disease.  I did review these findings with the patient and answered her questions.  She was given nitroglycerin sublingual and this improved her pain from about a 9 to a 4/10.  She is placed on a nitro drip experienced no further improvement of pain.  She was given a aspirin, GI cocktail and fentanyl also with no improvement of pain.  She seen resting comfortably but continues to complain of pain that is approximally 4/10 little bit to the left side.  Blood pressure was addressed with hydralazine and nitro  she is currently at 169/122.  Given ongoing symptoms in history of ESRD and what seems like lack of recent cardiac workup will admit.  And shift.  All pertinent details signed out to oncoming provider for continued management.    ED Course as of 08/26/21 0742   Mon Aug 26, 2021   0650 TROPONIN-I(!): 128      Admitted  Clinical Impression   Chest pain, unspecified type (Primary)   ESRD on  dialysis (CMS [facility])   Bacteremia   Anticoagulated by anticoagulation treatment     Medications Administered in the ED   aluminum-magnesium hydroxide-simethicone (MAG-AL PLUS) 200-200-20 mg per 5 mL oral liquid (30 mL Oral Given 08/26/21 0500)     And   hyoscyamine (HYOSYNE) 0.125 mg per 5 mL oral liquid (10 mL Oral Given 08/26/21 0500)     And   lidocaine (XYLOCAINE) 2% oral topical viscous solution (0 mL Oral Not Given 08/26/21 0600)   nitroGLYCERIN 50 mg in 250 ml D5W premix infusion (35 mcg/min Intravenous Rate Verify 08/26/21 0608)   aspirin chewable tablet 324 mg (324 mg Oral Given 08/26/21 0517)   nitroGLYCERIN (NITROSTAT) sublingual tablet (0.4 mg Sublingual Given 08/26/21 0414)   ondansetron (ZOFRAN) 2 mg/mL injection (4 mg Intravenous Given 08/26/21 0500)   acetaminophen (TYLENOL) tablet (975 mg Oral Given 08/26/21 0517)   fentaNYL (SUBLIMAZE) 50 mcg/mL injection (75 mcg Intravenous Given 08/26/21 0616)   hydrALAZINE (APRESOLINE) injection 20 mg (20 mg Intravenous Given 08/26/21 0615)        Current Discharge Medication List      CONTINUE these medications - NO CHANGES were made during your visit.      Details   acetaminophen 325 mg Tablet  Commonly known as: TYLENOL   650 mg, Oral, EVERY 4 HOURS PRN  Refills: 0     albuterol sulfate 90 mcg/actuation oral inhaler  Commonly known as: PROVENTIL or VENTOLIN or PROAIR   1-2 Puffs, Inhalation, EVERY 6 HOURS PRN  Refills: 0     * apixaban 5 mg Tablet  Commonly known as: ELIQUIS   5 mg, Oral, 2 TIMES DAILY  Qty: 60 Tablet  Refills: 0     AURYXIA ORAL   1 g, Oral, 3 TIMES DAILY BEFORE MEALS, "Take 2 tablets three times daily before meals and 1 tablet before snacks"  Refills: 0     budesonide-formoteroL 160-4.5 mcg/actuation oral inhaler  Commonly known as: SYMBICORT   2 Puffs, Inhalation, 2 TIMES DAILY  Refills: 0     calcitrioL 0.5 mcg Capsule  Commonly known as: ROCALTROL   1 mcg, Oral, EVERY MO, WE AND FR  Refills: 0     cinacalcet 30 mg Tablet  Commonly known as: SENSIPAR   30 mg,  Oral, EVERY EVENING  Refills: 0     cloNIDine 0.3 mg/24 hr Patch Weekly  Commonly known as: CATAPRES-TTS   0.3 mg, Transdermal, EVERY 7 DAYS  Refills: 0     clopidogreL 75 mg Tablet  Commonly known as: PLAVIX   75 mg, Oral, DAILY  Refills: 0     cyclobenzaprine 5 mg Tablet  Commonly known as: FLEXERIL   5 mg, Oral, 2 TIMES DAILY PRN  Refills: 0     dilTIAZem 180 mg Capsule, Sust. Release 24 hr  Commonly known as: CARDIZEM CD   180 mg, Oral, DAILY  Qty: 30 Capsule  Refills: 0     doxazosin 4 mg Tablet  Commonly known as: CARDURA   4 mg,  Oral, EVERY EVENING  Refills: 0     furosemide 80 mg Tablet  Commonly known as: LASIX   80 mg, Oral, 2 TIMES DAILY  Refills: 0     guaiFENesin 600 mg Tablet Extended Release 12hr  Commonly known as: MUCINEX   600 mg, Oral, EVERY 12 HOURS  Refills: 0     Isosorbide Mononitrate 120 mg Tablet Sustained Release 24 hr  Commonly known as: IMDUR   120 mg, Oral, EVERY MORNING  Qty: 30 Tablet  Refills: 0     labetaloL 300 mg Tablet  Commonly known as: NORMODYNE   300 mg, Oral, EVERY 12 HOURS  Qty: 60 Tablet  Refills: 0     loratadine 10 mg Tablet  Commonly known as: CLARITIN   10 mg, Oral, DAILY  Refills: 0     montelukast 10 mg Tablet  Commonly known as: SINGULAIR   10 mg, Oral, EVERY EVENING  Refills: 0     nitroGLYCERIN 0.4 mg Tablet, Sublingual  Commonly known as: NITROSTAT   0.4 mg, Sublingual, EVERY 5 MIN, for 3 doses over 15 minutes  Qty: 3 Tablet  Refills: 0     ondansetron 4 mg Tablet  Commonly known as: ZOFRAN   4 mg, Oral, EVERY 12 HOURS PRN  Refills: 0     pantoprazole 40 mg Tablet, Delayed Release (E.C.)  Commonly known as: PROTONIX   40 mg, Oral, DAILY  Refills: 0     vancomycin 1,500 mg in NS 15 mL infusion   20 mg/kg (1,500 mg), Intravenous, GIVE IN DIALYSIS, Mix and infuse per policy of [facility].  Qty: 1 Each  Refills: 0         * This list has 1 medication(s) that are the same as other medications prescribed for you. Read the directions carefully, and ask your  doctor or other care provider to review them with you.            ASK your doctor about these medications.      Details   * apixaban 5 mg Tablet  Commonly known as: ELIQUIS  Ask about: Should I take this medication?   10 mg, Oral, 2 TIMES DAILY  Qty: 10 Tablet  Refills: 0         * This list has 1 medication(s) that are the same as other medications prescribed for you. Read the directions carefully, and ask your doctor or other care provider to review them with you.

## 2021-08-26 NOTE — ED Triage Notes (Signed)
EMS reports X 3 hours CP , nausea , vomiting . Currently on vacnc for blood stream infection per pt . Recent hx of PE with thrombectomy. Hx of renal dialysis , last treatment on Friday

## 2021-08-26 NOTE — H&P (Signed)
[place]    HOSPITALIST H&P    [name] 46 y.o. female ICU03/A   Date of Service: 08/26/2021    Date of Admission:  08/26/2021   PCP: Cam Hai, FNP Code Status:Prior       Chief Complaint:  Chest pain, headache    HPI:   [name] is a 46 year old female who presented to the ED at [facility] with headache and chest pain. She reports she has had a headache for the past 3-4 days; she reports associated blurry vision. She reports she was sitting in a chair this morning when she developed mid/left chest pain radiating to neck and back. She reports she has been having increasing chest pain with minimal exertion, and that she cannot walk more than 4 steps without developing chest pain and dyspnea. 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 She was transferred to [facility] where she underwent thrombectomy and exchange of catheter. Blood cultures were found to be positive for Staph epidermidis, and she was started on 6 wk course of vancomycin with dialysis as well as apixaban. On presentation to [facility] ED today, she was markedly hypertensive to 230/144 mmHg. CBC reveals leukocytosis of 13.8k, Hgb 8.7g, Plt 261k. Electrolytes WNL. BUN 34 with creatinine 10.06. HS troponin I 139, 128, and 143, consecutively. CXR reveals findings consistent with volume overload/early CHF. EKG reveals SR, likely LVH, no ST-T wave elevations or depression. She was placed on nitroglycerin drip and transferred to our facility. Patient seen and evaluated on arrival to ICU. She reported ongoing chest pain on evaluation. She was on 40 mcg/min NTG drip. BP 220s/110s. She reports previous cardiac cath but has been several years ago.        ED medications:   Medications Administered in the ED   aluminum-magnesium hydroxide-simethicone (MAG-AL PLUS) 200-200-20 mg per 5 mL oral liquid (30 mL Oral Given  08/26/21 0500)     And   hyoscyamine (HYOSYNE) 0.125 mg per 5 mL oral liquid (10 mL Oral Given 08/26/21 0500)     And   lidocaine (XYLOCAINE) 2% oral topical viscous solution (0 mL Oral Not Given 08/26/21 0600)   nitroGLYCERIN 50 mg in 250 ml D5W premix infusion (35 mcg/min Intravenous Rate Verify 08/26/21 0608)   aspirin chewable tablet 324 mg (324 mg Oral Given 08/26/21 0517)   nitroGLYCERIN (NITROSTAT) sublingual tablet (0.4 mg Sublingual Given 08/26/21 0414)   ondansetron (ZOFRAN) 2 mg/mL injection (4 mg Intravenous Given 08/26/21 0500)   acetaminophen (TYLENOL) tablet (975 mg Oral Given 08/26/21 0517)   fentaNYL (SUBLIMAZE) 50 mcg/mL injection (75 mcg Intravenous Given 08/26/21 0616)   hydrALAZINE (APRESOLINE) injection 20 mg (20 mg Intravenous Given 08/26/21 0615)         PMHx:    Past Medical History:   Diagnosis Date   . Asthma    . Chronic diastolic CHF (congestive heart failure) (CMS HCC)    . Esophageal reflux    . ESRD (end stage renal disease) (CMS [place])    . History of anemia due to CKD    . Hypertension    . MI (myocardial infarction) (CMS [place])    . Mitral valve regurgitation    . Pulmonary edema    . Sleep apnea    . Tricuspid valve regurgitation         PSHx:   Past Surgical History:  Procedure Laterality Date   . ESOPHAGOGASTRODUODENOSCOPY     . HX BACK SURGERY     . HX CHOLECYSTECTOMY     . HX FOOT SURGERY Right    . HX HYSTERECTOMY     . HX TONSILLECTOMY            Allergies:    Allergies   Allergen Reactions   . Lisinopril Swelling     Tongue and throat swelling    Social History  Social History     Tobacco Use   . Smoking status: Every Day     Packs/day: 0.25     Types: Cigarettes   . Smokeless tobacco: Never   Vaping Use   . Vaping Use: Never used   Substance Use Topics   . Alcohol use: Not Currently   . Drug use: Yes     Types: Marijuana       Family History  Family Medical History:     Problem Relation (Age of Onset)    Breast Cancer Mother    Coronary Artery Disease Mother    Diabetes type II Mother     Hypertension (High Blood Pressure) Father             Home Meds:      Prior to Admission medications    Medication Sig Start Date End Date Taking? Authorizing Provider   acetaminophen (TYLENOL) 325 mg Oral Tablet Take 2 Tablets (650 mg total) by mouth Every 4 hours as needed 08/19/21   Scudiere, Loa Socks, APRN   albuterol sulfate (PROVENTIL OR VENTOLIN OR PROAIR) 90 mcg/actuation Inhalation oral inhaler Take 1-2 Puffs by inhalation Every 6 hours as needed    Provider, Historical   apixaban (ELIQUIS) 5 mg Oral Tablet Take 2 Tablets (10 mg total) by mouth Twice daily for 5 doses 08/19/21 08/22/21  Scudiere, Loa Socks, APRN   apixaban (ELIQUIS) 5 mg Oral Tablet Take 1 Tablet (5 mg total) by mouth Twice daily for 30 days 08/22/21 09/21/21  Scudiere, Loa Socks, APRN   budesonide-formoteroL [facility]) 160-4.5 mcg/actuation Inhalation oral inhaler Take 2 Puffs by inhalation Twice daily    Provider, Historical   calcitrioL (ROCALTROL) 0.5 mcg Oral Capsule Take 2 Capsules (1 mcg total) by mouth Every Monday, Wednesday and Friday    Provider, Historical   cinacalcet (SENSIPAR) 30 mg Oral Tablet Take 1 Tablet (30 mg total) by mouth Every evening    Provider, Historical   cloNIDine (CATAPRES-TTS) 0.3 mg/24 hr Transdermal Patch Weekly Place 1 Patch (0.3 mg total) on the skin Every 7 days 08/02/21   Provider, Historical   clopidogreL (PLAVIX) 75 mg Oral Tablet Take 1 Tablet (75 mg total) by mouth Once a day    Provider, Historical   cyclobenzaprine (FLEXERIL) 5 mg Oral Tablet Take 1 Tablet (5 mg total) by mouth Twice per day as needed for Muscle spasms    Provider, Historical   dilTIAZem (CARDIZEM CD) 180 mg Oral Capsule, Sust. Release 24 hr Take 1 Capsule (180 mg total) by mouth Once a day for 30 days 08/19/21 09/18/21  Scudiere, Loa Socks, APRN   doxazosin (CARDURA) 4 mg Oral Tablet Take 1 Tablet (4 mg total) by mouth Every evening    Provider, Historical   ferric citrate (AURYXIA ORAL) Take 1 g by mouth Three times daily before meals "Take 2 tablets three  times daily before meals and 1 tablet before snacks"    Provider, Historical   furosemide (LASIX) 80 mg Oral Tablet Take 1 Tablet (80 mg  total) by mouth Twice daily    Provider, Historical   guaiFENesin (MUCINEX) 600 mg Oral Tablet Extended Release 12hr Take 1 Tablet (600 mg total) by mouth Every 12 hours    Provider, Historical   isosorbide mononitrate (IMDUR) 120 mg Oral Tablet Sustained Release 24 hr Take 1 Tablet (120 mg total) by mouth Every morning for 30 days 08/20/21 09/19/21  Scudiere, Loa Socks, APRN   labetaloL (NORMODYNE) 300 mg Oral Tablet Take 1 Tablet (300 mg total) by mouth Every 12 hours for 30 days 08/19/21 09/18/21  Scudiere, Loa Socks, APRN   loratadine (CLARITIN) 10 mg Oral Tablet Take 1 Tablet (10 mg total) by mouth Once a day    Provider, Historical   montelukast (SINGULAIR) 10 mg Oral Tablet Take 1 Tablet (10 mg total) by mouth Every evening    Provider, Historical   nitroGLYCERIN (NITROSTAT) 0.4 mg Sublingual Tablet, Sublingual Place 1 Tablet (0.4 mg total) under the tongue Every 5 minutes for 3 doses for 3 doses over 15 minutes 06/23/21 08/13/21  Elder, Gildardo Cranker, MD   ondansetron (ZOFRAN) 4 mg Oral Tablet Take 1 Tablet (4 mg total) by mouth Every 12 hours as needed for Nausea/Vomiting    Provider, Historical   pantoprazole (PROTONIX) 40 mg Oral Tablet, Delayed Release (E.C.) Take 1 Tablet (40 mg total) by mouth Once a day    Provider, Historical   vancomycin 1,500 mg in NS 15 mL infusion 1,500 mg Give in Dialysis Mix and infuse per policy of [facility]. 08/19/21   Scudiere, Loa Socks, APRN   amoxicillin-pot clavulanate (AUGMENTIN) 500-125 mg Oral Tablet Take 1 Tablet by mouth Twice daily Take for 10 days; prescription was filled 08-02-21  08/19/21  Provider, Historical   aspirin (ECOTRIN) 81 mg Oral Tablet, Delayed Release (E.C.) Take 1 Tablet (81 mg total) by mouth Once a day  08/19/21  Provider, Historical   carvediloL (COREG) 25 mg Oral Tablet Take 1 Tablet (25 mg total) by mouth Twice daily  08/06/21   Provider, Historical   cloNIDine HCL (CATAPRES) 0.3 mg Oral Tablet Take 1 Tablet (0.3 mg total) by mouth Three times a day  08/05/21  Provider, Historical   dilTIAZem (CARDIZEM CD) 120 mg Oral Capsule, Sust. Release 24 hr Take 1 Capsule (120 mg total) by mouth Once a day  08/19/21  Provider, Historical   hydrOXYzine pamoate (VISTARIL) 25 mg Oral Capsule Take 1 Capsule (25 mg total) by mouth Every night as needed for Itching  Patient not taking: Reported on 08/05/2021  08/06/21  Provider, Historical   isosorbide mononitrate (IMDUR) 60 mg Oral Tablet Sustained Release 24 hr Take 1 Tablet (60 mg total) by mouth Every morning  08/19/21  Provider, Historical   labetaloL (NORMODYNE) 200 mg Oral Tablet Take 1 Tablet (200 mg total) by mouth Twice daily  08/19/21  Provider, Historical   losartan (COZAAR) 50 mg Oral Tablet Take 1 Tablet (50 mg total) by mouth Once a day  Patient not taking: Reported on 08/05/2021  08/06/21  Provider, Historical   oxyCODONE-acetaminophen (PERCOCET) 5-325 mg Oral Tablet Take 1 Tablet by mouth Every 4 hours as needed  Patient not taking: Reported on 08/05/2021 12/13/20 08/06/21  Buena Irish, MD   polyethylene glycol (MIRALAX) 17 gram Oral Powder in Packet Take 1 Packet (17 g total) by mouth Once a day 12/14/20 08/05/21  Buena Irish, MD   sennosides-docusate sodium (SENOKOT-S) 8.6-50 mg Oral Tablet Take 1 Tablet by mouth Twice daily 12/13/20 08/05/21  Buena Irish, MD  spironolactone (ALDACTONE) 25 mg Oral Tablet Take 1 Tablet (25 mg total) by mouth Twice daily  08/06/21  Provider, Historical          ROS:   General: No fever or chills. No weight changes, fatigue, weakness.   HEENT: No headaches, dizziness, changes in vision, changes in hearing, or difficulty swallowing.    Skin:  No rashes, erythema or bruises.   Cardiac: Reports chest pain. No palpitations or arrhythmia.    Respiratory: No cough or wheezing.  GI: No nausea or vomiting. No abdominal pain.   Urinary: No  dysuria, hematuria, or change in frequency.    Vascular: No edema.     Musculoskeletal: No muscle weakness, pain, or decreased range of motion.   Neurologic: No loss of sensation, numbness or tingling.   Endocrine: No heat or cold intolerance or polydipsia.   Psychiatric: No insomnia, depression or anxiety.      Results for orders placed or performed during the hospital encounter of 08/26/21 (from the past 24 hour(s))   ECG 12 LEAD   Result Value Ref Range    Ventricular rate 100 BPM    Atrial Rate 100 BPM    PR Interval 198 ms    QRS Duration 104 ms    QT Interval 374 ms    QTC Calculation 482 ms    Calculated P Axis 90 degrees    Calculated R Axis 78 degrees    Calculated T Axis 92 degrees   COMPREHENSIVE METABOLIC PANEL, NON-FASTING   Result Value Ref Range    SODIUM 137 136 - 145 mmol/L    POTASSIUM 3.8 3.5 - 5.1 mmol/L    CHLORIDE 96 (L) 98 - 107 mmol/L    CO2 TOTAL 29 21 - 32 mmol/L    ANION GAP 12 10 - 20 mmol/L    BUN 34 (H) 7 - 18 mg/dL    CREATININE 10.06 (H) 0.55 - 1.02 mg/dL    BUN/CREA RATIO 3     ESTIMATED GFR 4 (L) >59 mL/min/1.4m^2    ALBUMIN 3.6 3.4 - 5.0 g/dL    CALCIUM 9.9 8.5 - 10.1 mg/dL    GLUCOSE 94 74 - 106 mg/dL    ALKALINE PHOSPHATASE 98 46 - 116 U/L    ALT (SGPT) 12 <=78 U/L    AST (SGOT) 17 15 - 37 U/L    BILIRUBIN TOTAL 0.9 0.2 - 1.0 mg/dL    PROTEIN TOTAL 8.6 (H) 6.4 - 8.2 g/dL    ALBUMIN/GLOBULIN RATIO 0.7 (L) 0.8 - 1.4    OSMOLALITY, CALCULATED 281 270 - 290 mOsm/kg    CALCIUM, CORRECTED 10.3 mg/dL    GLOBULIN 5.0    TROPONIN-I NOW   Result Value Ref Range    TROPONIN I 139 (H) <15 ng/L   CBC WITH DIFF   Result Value Ref Range    WBCS UNCORRECTED      WBC 13.8 (H) 4.0 - 10.5 x10^3/uL    RBC 2.85 (L) 4.20 - 5.40 x10^6/uL    HGB 8.7 (L) 12.5 - 16.0 g/dL    HCT 26.6 (L) 37.0 - 47.0 %    MCV 93.2 78.0 - 99.0 fL    MCH 30.6 27.0 - 32.0 pg    MCHC 32.8 32.0 - 36.0 g/dL    RDW 18.9 (H) 11.6 - 14.8 %    PLATELETS 261 140 - 440 x10^3/uL    MPV 7.9 7.4 - 10.4 fL    NEUTROPHIL % 82 (H) 40 - 76  %  LYMPHOCYTE % 8 (L) 25 - 45 %    MONOCYTE % 7 0 - 12 %    EOSINOPHIL % 2 0 - 7 %    BASOPHIL % 0 0 - 3 %    NEUTROPHIL # 11.34 (H) 1.80 - 8.40 x10^3/uL    LYMPHOCYTE # 1.12 1.10 - 5.00 x10^3/uL    MONOCYTE # 0.94 0.00 - 1.30 x10^3/uL    EOSINOPHIL # 0.30 0.00 - 0.80 x10^3/uL    BASOPHIL # 0.06 0.00 - 0.30 x10^3/uL   PT/INR   Result Value Ref Range    PROTHROMBIN TIME 12.6 9.8 - 12.7 seconds    INR 1.19 (H) 0.88 - 1.10   TROPONIN-I IN ONE HOUR   Result Value Ref Range    TROPONIN I 128 (H) <15 ng/L   TROPONIN-I IN THREE HOURS   Result Value Ref Range    TROPONIN I 143 (H) <15 ng/L          Physical:  Filed Vitals:    08/26/21 0645 08/26/21 0700 08/26/21 0830 08/26/21 0930   BP: (!) 193/114 (!) 169/122 (!) 163/104 (!) 173/114   Pulse: 100 (!) 101 90 87   Resp: 19 16 (!) 11 15   Temp:    36.9 C (98.4 F)   SpO2: 96% 97% 96% 97%      General: Patient is alert and oriented to person, place, and time. No acute distress. Communicates appropriately.   Head: Normocephalic and atraumatic.    Eyes: Pupils equally round and react to light and accommodate. Extraocular movements intact.  Conjunctiva normal. Sclerae are normal.    Nose: Nasal passages clear. Mucosa moist.    Throat: Moist oral mucosa. No erythema or exudate of the pharynx. Clear oropharynx.    Neck: Supple. No cervical lymphadenopathy or supraclavicular nodes detected. Trachea midline   Heart: Regular rate and rhythm. S1 & S2 present. No S3 or S4. No rubs, gallops, or murmurs appreciated.  Radial and dorsalis pedis pulses +2/4 bilaterally.  Brisk capillary refill.    Lungs: Few scattered crackles noted. No wheezing or rhonchi. No accessory muscle use.   Abdomen: Tender in epigastrum. Soft, nondistended belly. Bowel sounds are present in all four quadrants. No rigidity.  No guarding.  No ascites.   Extremities: No edema, cyanosis, or clubbing. Grossly moves all extremities.    Skin: Warm and dry without lesions. No ecchymosis noted.    Neurologic: Cranial  nerves II through XII are grossly intact. Sensation to light touch is intact. Strength 5/5 in upper extremities and lower extremities bilaterally.    Genitourinary:  No urinary incontinence or Foley catheter   Psychiatric: Judgment and insight are intact. Mood and affect are appropriate for the situation.       Diagnostic studies:  No results found.       EKG interpretation: Sinus rhythm, rate 100, high voltage in precordial leads-likely LVH, PR 198 ms, QRS 104 ms, QTc 482 ms, no ST-T wave elevations or depressions      @PEVF @    Assessments:  Active Hospital Problems   (*Primary Problem)    Diagnosis   . *Chest pain in adult     1. Chest pain  2. Hypertensive emergency  3. Headache, R/O ICH  4. Epigastric pain  5. ESRD on HD  6. Fluid overload  7. Recent thrombectomy  8. Recent HD catheter exchange  9. Staph epidermidis bacteremia    Plan:  We will admit patient to ICU. Titrate down NTG  drip and start on nicardipine for afterload reduction. Stat CT of head to evaluate for ICH secondary to severe hypertension and anticoagulation. Stat CT angio of chest/abdomen to rule out aortic dissection secondary to severe hypertension and anticoagulation. Nephrology consulted, is going to perform HD tx today. Continue vancomycin with HD as prescribed. Cardiology consultation for CP and elevated troponin. Continue home medications as appropriate. Repeat labs in AM. Further interventions will depend on clinical course and progress. Plan of care discussed with patient and nurse and all questions answered.    Patient is a 46 year old female who will be admitted for the above problems.      Code status: Prior  DVT prophylaxis: apixaban    Diet: DIET RENAL Do you want to initiate MNT Protocol? Yes    Disposition:  The patient is currently acutely ill requiring treatment in the ICU for above diagnoses. Patient will be closely evaluated monitor and remove be adjusted accordingly.  Estimated length of stay greater than 48 hr to obtain  full medical treatment.      Tivis Ringer, FNP-BC    [place] HOSPITALIST    Agree with above.   Recent started on anticoagulation.   Presented with HA prior to starting nitroglycerin.  No focal deficits.  Pain in chest with radiation to back.  Radiation to back is something she has never had.  Hypertensive currently on preload reducer.  Head CT to assess ICU given that it preceded NTG and on new anticoagulation  CTA chest for dissection given HTN and new cp radiation to back  Change antihypertensive to afterload reducer rather than preload reducer.      Cc time 50 minutes

## 2021-08-26 NOTE — ED Nurses Note (Signed)
3 Doses of nitro given     Nitro sublingual given at 04:14  Pain is 8/10  Nitro Sublingual given at 04:19 Pain is 8/10  Nitro Sublingual given at 04:24 O[place]ub [place] 5/10

## 2021-08-26 NOTE — ED Nurses Note (Signed)
orgionally assigned to telemetry bed, but she is on NTG iv drip had to be changed to ICU admit. Spoke with Dr. Flavia Shipper and Gerrie Nordmann. RN, charge in ICU will go to bed 3

## 2021-08-26 NOTE — Nurses Notes (Signed)
3 hours of hemodialysis completed with reported 2L of fluid removed.  Patient tolerated dialysis well.  Denies chest pain at present.

## 2021-08-26 NOTE — ED Nurses Note (Signed)
[facility] contacted to transport patient to main campus at this time

## 2021-08-26 NOTE — Nurses Notes (Signed)
Patient admitted to ICU-3 via [place] from [facility] ER with Chest pain, abd pain; uncontrolled HTN.  Patient moved herself to ICU bed from stretcher.  Patient disrobed and dressed in patient gown.  Monitoring hardward applied.  Patient receiving IV NTG at 40 mcg/min upon arrival.  Denies chest pain.  Reports midepigastric area being "sore and achy."  Has an AV graft in left upper arm that patient states, "Doesn't work."  No palpable thrill noted or bruit auscultated.  Permacath to right upper chest noted for hemodialysis.

## 2021-08-26 NOTE — ED Nurses Note (Signed)
[facility] transporting patient to [facility] main campus. Report called to [facility] on [place]. 20G RAC flushes without difficulty, 24G right lower forearm intact. No signs or symptoms of distress noted.

## 2021-08-26 NOTE — Nurses Notes (Signed)
Patient states, "My chest is hurting again."  Described chest pain to be "aching" with pain rating of 6 midsternally.   Ulice Dash NP notified of patients chest pain.  He stated that he would order patient Morphine.

## 2021-08-26 NOTE — Care Plan (Signed)
Patient admitted to ICU today after experiencing chest pain/abdominal pain.  BP uncontrolled.   Patient receiving IV NTG and IV Nicardipine for elevated BP.  Pain has been tolerable since admission.  CT of head and CT of chest/abd completed. Patient to receive hemodialysis today.   Problem: Adult Inpatient Plan of Care  Goal: Plan of Care Review  08/26/2021 1337 by Dillon Bjork, RN  Outcome: Ongoing (see interventions/notes)  08/26/2021 1333 by Dillon Bjork, RN  Outcome: Ongoing (see interventions/notes)

## 2021-08-27 DIAGNOSIS — I119 Hypertensive heart disease without heart failure: Secondary | ICD-10-CM

## 2021-08-27 DIAGNOSIS — I120 Hypertensive chronic kidney disease with stage 5 chronic kidney disease or end stage renal disease: Secondary | ICD-10-CM

## 2021-08-27 LAB — BASIC METABOLIC PANEL
ANION GAP: 8 mmol/L — ABNORMAL LOW (ref 10–20)
BUN/CREA RATIO: 4 — ABNORMAL LOW (ref 6–22)
BUN: 29 mg/dL — ABNORMAL HIGH (ref 7–25)
CALCIUM: 9.3 mg/dL (ref 8.6–10.3)
CHLORIDE: 99 mmol/L (ref 98–107)
CO2 TOTAL: 30 mmol/L (ref 21–31)
CREATININE: 7.88 mg/dL — ABNORMAL HIGH (ref 0.60–1.30)
ESTIMATED GFR: 6 mL/min/{1.73_m2} — ABNORMAL LOW (ref >59)
GLUCOSE: 99 mg/dL (ref 74–109)
OSMOLALITY, CALCULATED: 280 mOsm/kg (ref 270–290)
POTASSIUM: 4 mmol/L (ref 3.5–5.1)
SODIUM: 137 mmol/L (ref 136–145)

## 2021-08-27 LAB — CBC
HCT: 21.9 % — ABNORMAL LOW (ref 37.0–47.0)
HGB: 7.4 g/dL — ABNORMAL LOW (ref 12.5–16.0)
MCH: 30.3 pg (ref 27.0–32.0)
MCHC: 33.9 g/dL (ref 32.0–36.0)
MCV: 89.4 fL (ref 78.0–99.0)
MPV: 6.9 fL — ABNORMAL LOW (ref 7.4–10.4)
PLATELETS: 221 10*3/uL (ref 140–440)
RBC: 2.45 10*6/uL — ABNORMAL LOW (ref 4.20–5.40)
RDW: 18.7 % — ABNORMAL HIGH (ref 11.6–14.8)
WBC: 9.7 10*3/uL (ref 4.0–10.5)
WBCS UNCORRECTED: 9.7 10*3/uL

## 2021-08-27 MED ORDER — ETHYL ALCOHOL 62 % (NOZIN NASAL SANITIZER) NASAL SOLUTION - BULK BOTTLE
1.0000 | Freq: Two times a day (BID) | NASAL | Status: DC
Start: 2021-08-27 — End: 2021-08-27
  Administered 2021-08-27: 1 via NASAL

## 2021-08-27 MED ORDER — AMLODIPINE 10 MG TABLET
10.0000 mg | ORAL_TABLET | Freq: Every day | ORAL | Status: DC
Start: 2021-08-27 — End: 2021-08-27
  Administered 2021-08-27: 10 mg via ORAL
  Filled 2021-08-27: qty 1

## 2021-08-27 NOTE — Nurses Notes (Signed)
Patient discharged to home with family.  Discharge instructions given.  Patient verbalized understanding.  Patient has denied chest pain or discomfort today.  States"I feel better today. I'm ready to go home."

## 2021-08-27 NOTE — Discharge Summary (Signed)
[facility]  DISCHARGE SUMMARY    PATIENT NAME:  Dawn, Malone  MRN:  [id number]  DOB:  01/30/1976    ENCOUNTER DATE:  08/26/2021  INPATIENT ADMISSION DATE: 08/26/2021  DISCHARGE DATE:  08/27/2021    ATTENDING PHYSICIAN: Alvina Filbert, MD  SERVICE: PRN HOSPITALIST INTENSIVIST  PRIMARY CARE PHYSICIAN: Cam Hai, FNP       No lay caregiver identified.      PRIMARY DISCHARGE DIAGNOSIS: Chest pain in adult  Active Hospital Problems    Diagnosis Date Noted   . Hypertensive heart disease [I11.9] 07/01/2021   . ESRD (end stage renal disease) on dialysis (CMS HCC) [N18.6, Z99.2] 12/04/2020   . Uncontrolled hypertension [I10] 12/04/2020      Resolved Hospital Problems    Diagnosis    . Principal Problem: Chest pain in adult [R07.9]    . Hypertensive urgency [I16.0]      Active Non-Hospital Problems    Diagnosis Date Noted   . Fluid overload 08/11/2021   . Facial swelling 08/11/2021   . Dyspnea 08/05/2021   . Anemia in chronic kidney disease 08/05/2021   . Hypervolemia associated with renal insufficiency 08/05/2021   . Coronary artery disease involving native coronary artery 07/01/2021   . LVH (left ventricular hypertrophy) 07/01/2021   . Mild aortic stenosis 07/01/2021   . Noncompliance 07/01/2021   . Elevated troponin I level 06/30/2021   . Nausea 06/30/2021   . Congestive heart failure (CMS HCC) 06/30/2021   . Chronic chest pain 06/27/2021   . 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 Tobacco dependence 12/04/2020   . GERD (gastroesophageal reflux disease) 12/04/2020   . Insomnia 12/04/2020           Current Discharge Medication List      CONTINUE these medications which have CHANGED during your visit.      Details   apixaban 5 mg Tablet  Commonly known as: ELIQUIS  What changed: Another medication with the same name was removed. Continue taking this medication, and follow the directions you see here.   5 mg, Oral, 2 TIMES DAILY  Qty: 60 Tablet  Refills: 0        CONTINUE these medications - NO CHANGES were made during  your visit.      Details   acetaminophen 325 mg Tablet  Commonly known as: TYLENOL   650 mg, Oral, EVERY 4 HOURS PRN  Refills: 0     albuterol sulfate 90 mcg/actuation oral inhaler  Commonly known as: PROVENTIL or VENTOLIN or PROAIR   1-2 Puffs, Inhalation, EVERY 6 HOURS PRN  Refills: 0     AURYXIA ORAL   1 g, Oral, 3 TIMES DAILY BEFORE MEALS, "Take 2 tablets three times daily before meals and 1 tablet before snacks"  Refills: 0     budesonide-formoteroL 160-4.5 mcg/actuation oral inhaler  Commonly known as: SYMBICORT   2 Puffs, Inhalation, 2 TIMES DAILY  Refills: 0     calcitrioL 0.5 mcg Capsule  Commonly known as: ROCALTROL   1 mcg, Oral, EVERY MO, WE AND FR  Refills: 0     cinacalcet 30 mg Tablet  Commonly known as: SENSIPAR   30 mg, Oral, EVERY EVENING  Refills: 0     cloNIDine 0.3 mg/24 hr Patch Weekly  Commonly known as: CATAPRES-TTS   0.3 mg, Transdermal, EVERY 7 DAYS  Refills: 0     clopidogreL 75 mg Tablet  Commonly known as: PLAVIX   75 mg, Oral, DAILY  Refills: 0     cyclobenzaprine 5 mg Tablet  Commonly known as: FLEXERIL   5 mg, Oral, 2 TIMES DAILY PRN  Refills: 0     dilTIAZem 180 mg Capsule, Sust. Release 24 hr  Commonly known as: CARDIZEM CD   180 mg, Oral, DAILY  Qty: 30 Capsule  Refills: 0     doxazosin 4 mg Tablet  Commonly known as: CARDURA   4 mg, Oral, EVERY EVENING  Refills: 0     furosemide 80 mg Tablet  Commonly known as: LASIX   160 mg, Oral, DAILY, Take after dialysis on dialysis days; otherwise take in morning daily.   Refills: 0     guaiFENesin 600 mg Tablet Extended Release 12hr  Commonly known as: MUCINEX   600 mg, Oral, EVERY 12 HOURS  Refills: 0     Isosorbide Mononitrate 120 mg Tablet Sustained Release 24 hr  Commonly known as: IMDUR   120 mg, Oral, EVERY MORNING  Qty: 30 Tablet  Refills: 0     labetaloL 300 mg Tablet  Commonly known as: NORMODYNE   300 mg, Oral, EVERY 12 HOURS  Qty: 60 Tablet  Refills: 0     loratadine 10 mg Tablet  Commonly known as: CLARITIN   10 mg, Oral,  DAILY  Refills: 0     montelukast 10 mg Tablet  Commonly known as: SINGULAIR   10 mg, Oral, EVERY EVENING  Refills: 0     nitroGLYCERIN 0.4 mg Tablet, Sublingual  Commonly known as: NITROSTAT   0.4 mg, Sublingual, EVERY 5 MIN, for 3 doses over 15 minutes  Qty: 3 Tablet  Refills: 0     ondansetron 4 mg Tablet  Commonly known as: ZOFRAN   4 mg, Oral, EVERY 12 HOURS PRN  Refills: 0     pantoprazole 40 mg Tablet, Delayed Release (E.C.)  Commonly known as: PROTONIX   40 mg, Oral, DAILY  Refills: 0     vancomycin 1,500 mg in NS 15 mL infusion   20 mg/kg (1,500 mg), Intravenous, GIVE IN DIALYSIS, Mix and infuse per policy of [facility].  Qty: 1 Each  Refills: 0          Discharge med list refreshed?  YES     Allergies   Allergen Reactions   . Lisinopril Swelling     Tongue and throat swelling     HOSPITAL PROCEDURE(S):   No orders of the defined types were placed in this encounter.      REASON FOR HOSPITALIZATION AND HOSPITAL COURSE   BRIEF HPI:  This is a 46 y.o., female admitted for chest pain and hypertensive urgency  BRIEF HOSPITAL NARRATIVE:     [name] is a 46 year old female who presented to the ED at [facility] on 08/26/21 with chest pain and headache. She reported midsternal chest pain radiating to neck and back. She was found to have severely uncontrolled hypertension, 194R systolic. Troponin I elevated to 180's. EKG was nonischemic. CXR consistent with fluid overload. She has history of hypertensive nephropathy with ESRD on HD (MWF schedule). She also had recent SVC thrombus causing SVC syndrome, and had thrombectomy and replacement of HD catheter at [facility], and she was placed on Eliquis. She was found there to have Staph epidermidis bacteremia, and started on 6 week course of vancomycin during dialysis treatments per ID.  At [facility] ED, she was placed on nitroglycerin drip and transferred to our facility. She was taken off NTG drip and  transitioned to nicardipine drip. She had hemodialysis  treatment. BP improved. Nicardipine drip stopped. She was transitioned back to her home antihypertensives, and SBP 120-140's. She had CT of head that showed chronic white matter changes, likely on basis of chronic uncontrolled hypertension. She had CTA of chest/abdomen that did not show any evidence of aortic dissection or aneurysm, but did show ? Sequelae of L5-S1 discitis. Clinically, she had no complaint of back pain or fevers, and was felt to be unlikely a current infection; in any event, she was on 6 week course of vancomycin. On 7/4, she was feeling significantly better. She was seen in consultation by cardiologist, who recommended myocardial perfusion scan. She requested to be discharged or she would leave against medical advice. She was unwilling to stay for MPS. She will be discharged to home today. She is to follow up with her regular dialysis schedule at her hemodialysis center tomorrow. She is to continue her vancomycin course as already ordered. We direct that she take all of her regular medications as prescribed.     TRANSITION/POST DISCHARGE CARE/PENDING TESTS/REFERRALS:     CONDITION ON DISCHARGE:  A. Ambulation: Full ambulation  B. Self-care Ability: With partial assistance  C. Cognitive Status Oriented x 3  D. Code status at discharge:       LINES/DRAINS/WOUNDS AT DISCHARGE:   Patient Lines/Drains/Airways Status     Active Line / Dialysis Catheter / Dialysis Graft / Drain / Airway / Wound     Name Placement date Placement time Site Days    Peripheral IV Anterior;Right;Upper Arm 08/26/21  0422  -- 1    Peripheral IV Right Median Antebrachial 08/26/21  0520  -- 1    AV Fistula Left;Upper Arm 12/05/20  1502  -- 264    Dialysis Catheter Cuffed/Tunneled 08/13/21  1316  -- 13    Dialysis Catheter --  --  -- --                DISCHARGE DISPOSITION:  Home discharge  DISCHARGE INSTRUCTIONS:    No discharge procedures on file.       Tivis Ringer, FNP-BC    Copies sent to Care Team       Relationship  Specialty Notifications Start End    Deel, Leona Carry, FNP PCP - General NURSE PRACTITIONER  12/04/20     Phone: [phone] Fax: [phone]         [address] [place] [address]          Referring providers can utilize [email or web address] to access their referred [facility] patient's information.

## 2021-08-27 NOTE — Progress Notes (Signed)
[facility]  IP PROGRESS NOTE      Date of Admission:  08/26/2021  Hospital Day:  LOS: 1 day       Subjective:   Sleeping this AM..  Got dilaudid and ativan for anxiety and pain last night.  Off nicardipine.    Vitals:  Temp (24hrs) Max:36.9 C (98.5 F)      Temperature: 36.3 C (97.4 F)  BP (Non-Invasive): 126/71  MAP (Non-Invasive): 88 mmHG  Heart Rate: 85  Respiratory Rate: 13  SpO2: 94 %    I/O:  I/O last 24 hours:    Intake/Output Summary (Last 24 hours) at 08/27/2021 0746  Last data filed at 08/27/2021 0600  Gross per 24 hour   Intake 2413.06 ml   Output --   Net 2413.06 ml       PE:  General: NAD  HEENT:  no thrush, no scleral icterus, no conjunctival pallor  Cardiovascular:  Regular rate, normal S1 and S2, no murmurs or extra heart sounds, 2+ radial pulses B/L, hyperdynamic  Respiratory:  No increased work of breathing, good air movement throughout, no wheezes or crackles  Abdomen: Soft, non-tender, non-distended  Extremities: No lower extremity edema  Skin: Warm, no rashes  Neurologic: nonfocal      Current Meds:  acetaminophen (TYLENOL) tablet, 650 mg, Oral, Q4H PRN  albuterol 90 mcg per inhalation oral inhaler - "Respiratory to administer", 2 Puff, Inhalation, Q6H PRN  amLODIPine (NORVASC) tablet, 10 mg, Oral, Daily  apixaban (ELIQUIS) tablet, 5 mg, Oral, 2x/day  budesonide-formoterol (SYMBICORT) 160 mcg-4.5 mcg per inhalation oral inhaler - "Respiratory to administer", 2 Puff, Inhalation, 2x/day  [START ON 08/28/2021] calcitriol (ROCALTROL) capsule, 1 mcg, Oral, M, W, and F  cinacalcet (SENSIPAR) tablet, 30 mg, Oral, QPM  [START ON 08/30/2021] cloNIDine (CATAPRES-TTS) transdermal patch 0.3 mg, 0.3 mg, Transdermal, Q7 Days  clopidogrel (PLAVIX) 75 mg tablet, 75 mg, Oral, Daily  cyclobenzaprine (FLEXERIL) tablet, 5 mg, Oral, 2x/day PRN  doxazosin (CARDURA) tablet, 4 mg, Oral, QPM  furosemide (LASIX) tablet, 160 mg, Oral, Daily  guaiFENesin (MUCINEX) extended release tablet - for cough  (expectorant), 600 mg, Oral, 2x/day  HYDROmorphone (DILAUDID) 2 mg/mL injection, 0.5 mg, Intravenous, Now  isosorbide mononitrate (IMDUR) 24 hr extended release tablet, 120 mg, Oral, QAM  labetalol (NORMODYNE) tablet, 300 mg, Oral, 2x/day  lidocaine (XYLOCAINE) 2% oral topical viscous solution, 15 mL, Oral, Once  loratadine (CLARITIN) tablet, 10 mg, Oral, Daily  LORazepam (ATIVAN) tablet, 1 mg, Oral, HS PRN  montelukast (SINGULAIR) 10 mg tablet, 10 mg, Oral, QPM  niCARdipine (CARDENE) 25 mg in NS 250 mL (tot vol) infusion, 7.5 mg/hr, Intravenous, Continuous  nitroGLYCERIN 50 mg in 250 ml D5W premix infusion, 10 mcg/min, Intravenous, Continuous  NS flush syringe, 3 mL, Intracatheter, Q8HRS  NS flush syringe, 3 mL, Intracatheter, Q1H PRN  ondansetron (ZOFRAN) 2 mg/mL injection, 4 mg, Intravenous, Q8H PRN  ondansetron (ZOFRAN) tablet, 4 mg, Oral, Q12H PRN  pantoprazole (PROTONIX) delayed release tablet, 40 mg, Oral, Daily  vancomycin (VANCOCIN) 1,500 mg in NS 500 mL IVPB, 20 mg/kg (Adjusted), Intravenous, Give in Dialysis  Vancomycin IV - Pharmacist to Dose per Protocol, , Does not apply, Daily PRN        Labs:  Reviewed:   Results for orders placed or performed during the hospital encounter of 08/26/21 (from the past 24 hour(s))   TROPONIN-I IN THREE HOURS   Result Value Ref Range    TROPONIN I 143 (H) <15 ng/L  Narrative    Values received on females ranging between 12-15 ng/L MUST include the next serial troponin to review changes in the delta differences as the reference range for the Access II chemistry analyzer is lower than the established reference range.     CBC   Result Value Ref Range    WBCS UNCORRECTED 9.7 x10^3/uL    WBC 9.7 4.0 - 10.5 x10^3/uL    RBC 2.45 (L) 4.20 - 5.40 x10^6/uL    HGB 7.4 (L) 12.5 - 16.0 g/dL    HCT 21.9 (L) 37.0 - 47.0 %    MCV 89.4 78.0 - 99.0 fL    MCH 30.3 27.0 - 32.0 pg    MCHC 33.9 32.0 - 36.0 g/dL    RDW 18.7 (H) 11.6 - 14.8 %    PLATELETS 221 140 - 440 x10^3/uL    MPV 6.9 (L)  7.4 - 10.4 fL   BASIC METABOLIC PANEL   Result Value Ref Range    SODIUM 137 136 - 145 mmol/L    POTASSIUM 4.0 3.5 - 5.1 mmol/L    CHLORIDE 99 98 - 107 mmol/L    CO2 TOTAL 30 21 - 31 mmol/L    ANION GAP 8 (L) 10 - 20 mmol/L    CALCIUM 9.3 8.6 - 10.3 mg/dL    GLUCOSE 99 74 - 109 mg/dL    BUN 29 (H) 7 - 25 mg/dL    CREATININE 7.88 (H) 0.60 - 1.30 mg/dL    BUN/CREA RATIO 4 (L) 6 - 22    ESTIMATED GFR 6 (L) >59 mL/min/1.74m2    OSMOLALITY, CALCULATED 280 270 - 290 mOsm/kg    Narrative    Estimated Glomerular Filtration Rate (eGFR) is calculated using the CKD-EPI (2021) equation, intended for patients 162years of age and older. If gender is not documented or "unknown", there will be no eGFR calculation.          Radiology:  Reviewed:   Results for orders placed or performed during the hospital encounter of 08/26/21 (from the past 24 hour(s))   CT BRAIN WO IV CONTRAST     Status: None    Narrative    Leitha Claud    RADIOLOGIST: MColletta Maryland MD    CT BRAIN WO IV CONTRAST performed on 08/26/2021 11:45 AM    CLINICAL HISTORY: HA, anticoagulation, severe HTN.  chest pain, headache, ams    TECHNIQUE:  Head CT without intravenous contrast.    COMPARISON: 09/02/2020  # of known CTs in the past 12 months: 10   # of known Cardiac Nuclear Medicine Studies in the past 12 months: 0    FINDINGS:  There is no acute intracranial hemorrhage, mass effect, or evidence of large acute infarct.    Brain: There are some low-attenuation areas in the cerebral white matter which is an abnormal finding in a 46year old patient. These findings could be due to chronic microvascular changes or foci of demyelination. Similar findings were present on 09/02/2020.    CSF Spaces: Normal     Sinuses/Mastoids:  There is opacification of left-sided mastoid air cells     Bones: Unremarkable        Impression    1.NO ACUTE FINDINGS  2.THERE ARE MULTIPLE LOW-ATTENUATION AREAS IN THE CEREBRAL WHITE MATTER WHICH WERE PRESENT ON 09/02/2020 BUT ARE NONSPECIFIC  AS TO THE ETIOLOGY. THESE ARE ABNORMAL FINDINGS IN A 46YEAR-OLD PATIENT. THIS COULD BE BETTER EVALUATED WITH MRI THE BRAIN IF CLINICALLY INDICATED  3.THE LEFT  MASTOID AIR CELLS ARE OPACIFIED WHICH REPRESENTS INTERVAL WORSENING COMPARED TO 09/02/2020.      One or more dose reduction techniques were used (e.g., Automated exposure control, adjustment of the mA and/or kV according to patient size, use of iterative reconstruction technique).      Radiologist location ID: [id number]     CT ANGIO CHEST ABDOMEN W CONTRAST     Status: None    Narrative    Kacee Marner    RADIOLOGIST: Karna Dupes    CT ANGIO CHEST ABDOMEN W CONTRAST performed on 08/26/2021 11:46 AM    CLINICAL HISTORY: Abd and chest pain, severe hypertension.  chest pain, hx of pe, dialysis,     TECHNIQUE: CTA imaging of the chest and abdomen with intravenous contrast.  3D reconstructions.  IV CONTRAST: 75 ml's of Omnipaque 350    COMPARISON: Chest CTA from August 11, 2021. CT abdomen and pelvis from February 25, 2021      FINDINGS:  Heart:  No current pericardial effusion. Previously seen small pericardial effusion on August 11, 2021 is no longer identified. Cardiomegaly. Coronary and cardiac valvular calcifications are again partially visualized.    Pulmonary Vessels:  No suspicious pulmonary arterial intraluminal filling defects are identified to suggest pulmonary embolism.      Thoracic Aorta:  The thoracic aorta remains normal in course and caliber. No evidence of thoracic aortic dissection or aneurysm. Moderate calcified atherosclerotic changes of the thoracic aorta and its branches again seen.    Abdominal Aorta: Moderately severe calcified atherosclerotic changes of the abdominal aorta and its branches present. No abdominal aortic aneurysm or findings to suggest aortic dissection are identified.  Iliac Arteries: Moderately severe calcified atherosclerotic changes are present without evidence of aneurysmal dilation or findings to suggest  dissection.    Celiac: Patent without hemodynamically significant stenosis or occlusion.  SMA: Patent without hemodynamically significant stenosis or occlusion.  IMA : Patent and otherwise unremarkable.    Renal arteries: Proximal right renal artery stent is questioned with moderately severe calcified atherosclerotic changes of the bilateral renal arteries present. No occlusion identified. Chronic bilateral medical renal disease without hydronephrosis.    Hardware:  A right internal jugular central venous catheter is again seen with tip residing within the superior vena cava in satisfactory position. No current convincing thrombus within the superior vena cava is identified on today's study when compared to 08/11/2021. Subcutaneous fat stranding adjacent to the catheter near the skin entry site is now seen and with questionable limited local adjacent skin thickening. Endovascular stent about the left axillary region is again partially visualized.    Other Findings: Limited bibasilar dependent subsegmental atelectasis is present. No pleural effusion. No pneumothorax. No airspace consolidation. Faint groundglass opacities with a degree of bibasilar septal thickening again seen and may represent a component of edema Mildly enlarged mediastinal lymph nodes are present measuring up to 1.4 cm in short axis in the pretracheal station overall similar to 08/11/2021 . Mildly prominent but not abnormally enlarged bilateral hilar lymph nodes are also noted measuring under 1 cm in short axis. Mediastinal and hilar lymph nodes are favored to be reactive in nature. Previously seen enlarged right axillary lymph nodes are significantly less pronounced. Nonenlarged reactive appearing bilateral axillary lymph nodes are present. Previously seen fat stranding about the right axillary region is no longer identified. Cholecystectomy. No abdominal ascites.    Cortical endplate destructive changes involving the L5 vertebral body inferior  endplate and S1 vertebral body superior endplate present with disc space  narrowing and sclerotic features. Small amount of vacuum disc phenomenon at L5-S1 is present. A degree of posterior disc osteophytic spurring is also partially visualized. Disc bulging and posterior disc osteophytic spurring at L4-L5 also present.        Impression    1. NO EVIDENCE OF THORACIC AORTIC ANEURYSM OR THORACIC AORTIC DISSECTION.  2. NO EVIDENCE OF PULMONARY EMBOLISM.  3. PULMONARY CONGESTIVE CHANGES AND LIKELY MILD DEGREE OF EDEMA.  4. NO ABDOMINAL AORTIC ANEURYSM OR EVIDENCE OF ABDOMINAL AORTIC DISSECTION.  5. SEVERE DIFFUSE ATHEROSCLEROTIC DISEASE.  6. 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 PLEASE CORRELATE WITH CLINICAL FINDINGS AND PATIENT HISTORY. MRI OF THE LUMBAR SPINE COULD BE CONSIDERED FOR FURTHER EVALUATION IF NEEDED.  7. SMALL AMOUNT OF LOCALIZED SUBCUTANEOUS GRAYING AND FAT STRANDING AT THE RIGHT INTERNAL JUGULAR CENTRAL VENOUS CATHETER SKIN ENTRY SITE. PLEASE CORRELATE WITH CLINICAL FINDINGS TO EXCLUDE CELLULITIS/INFECTION.      One or more dose reduction techniques were used (e.g., Automated exposure control, adjustment of the mA and/or kV according to patient size, use of iterative reconstruction technique).      Radiologist location ID: [id number]             Problem List:  Active Hospital Problems   (*Primary Problem)    Diagnosis   . *Chest pain in adult   . Hypertensive urgency   . Hypertensive heart disease   . ESRD (end stage renal disease) on dialysis (CMS HCC)   . Uncontrolled hypertension       A/P:  1. Hypertensive urgency  2. Chest pain  3. Headache  4. esrd  5. Possible osteo/diskitis    Continue vancomycin for CNS staph bacteremia +/- osteo/diskitis  Off nicardipine  Add Norvasc  Continue other antihypertensives  Dialysis per Nephrology  CT of the chest without aortic dissection  CT of her head without intracranial bleed  Resume anticoagulation    Stable for floor  transfer

## 2021-08-27 NOTE — Consults (Signed)
National  NOTE    Name:  [name]   DOB: 04/14/1975   MRN: [id number]   Date:  08/26/2021     PCP: Cam Hai, [facility] Day:  LOS: 1 day     Chief Complaint: Chest Pain  and Nausea        IMPRESSION/PLAN:  18 yop with chest pain in setting of SBP 240. Unlikely ischemia, as she had acceptable cath in 2019. Will order lexiscan.       1.Continue BP control   2. Unlikely cardiac but will get lexiscan as she had negative cath in 2019  3. No need to continue trending enzymes, they are chronically elevated        History of Present Illness:  [name] is a 46 y.o. female [name] is a 46 year old female who presented to the ED at [facility] with headache and chest pain. She reports she has had a headache for the past 3-4 days; she reports associated blurry vision. She reports she was sitting in a chair this morning when she developed mid/left chest pain radiating to neck and back. She reports she has been having increasing chest pain with minimal exertion, and that she cannot walk more than 4 steps without developing chest pain and dyspnea. 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 She was transferred to [facility] where she underwent thrombectomy and exchange of catheter. Blood cultures were found to be positive for Staph epidermidis, and she was started on 6 wk course of vancomycin with dialysis as well as apixaban.  Patient known to have end-stage renal disease hemodialysis Monday Wednesday Friday. Had a cath in 2019, non-obstructive CAD.       Past Medical History:   Diagnosis Date    Asthma     Chronic diastolic CHF (congestive heart failure) (CMS HCC)     Esophageal reflux     ESRD (end stage renal disease) (CMS HCC)     History of anemia due to CKD     Hypertension     MI (myocardial infarction) (CMS [facility])     Mitral valve regurgitation      Pulmonary edema     Sleep apnea     Tricuspid valve regurgitation          Patient Active Problem List    Diagnosis Date Noted    Chest pain in adult 08/26/2021    Fluid overload 08/11/2021    Hypertensive urgency 08/11/2021    Facial swelling 08/11/2021    Dyspnea 08/05/2021    Anemia in chronic kidney disease 08/05/2021    Hypervolemia associated with renal insufficiency 08/05/2021    Coronary artery disease involving native coronary artery 07/01/2021    Hypertensive heart disease 07/01/2021    LVH (left ventricular hypertrophy) 07/01/2021    Mild aortic stenosis 07/01/2021    Noncompliance 07/01/2021    Elevated troponin I level 06/30/2021    Nausea 06/30/2021    Congestive heart failure (CMS HCC) 06/30/2021    Chronic chest pain 06/27/2021    SVC syndrome 12/04/2020    ESRD (end stage renal disease) on dialysis (CMS [facility]) 12/04/2020    Uncontrolled hypertension 12/04/2020    Tobacco dependence 12/04/2020    GERD (gastroesophageal reflux disease) 12/04/2020    Insomnia 12/04/2020      Past Surgical History:  Procedure Laterality Date    ESOPHAGOGASTRODUODENOSCOPY      HX BACK SURGERY      HX CHOLECYSTECTOMY      HX FOOT SURGERY Right     HX HYSTERECTOMY      HX TONSILLECTOMY            acetaminophen (TYLENOL) tablet, 650 mg, Oral, Q4H PRN  albuterol 90 mcg per inhalation oral inhaler - "Respiratory to administer", 2 Puff, Inhalation, Q6H PRN  amLODIPine (NORVASC) tablet, 10 mg, Oral, Daily  apixaban (ELIQUIS) tablet, 5 mg, Oral, 2x/day  budesonide-formoterol (SYMBICORT) 160 mcg-4.5 mcg per inhalation oral inhaler - "Respiratory to administer", 2 Puff, Inhalation, 2x/day  [START ON 08/28/2021] calcitriol (ROCALTROL) capsule, 1 mcg, Oral, M, W, and F  cinacalcet (SENSIPAR) tablet, 30 mg, Oral, QPM  [START ON 08/30/2021] cloNIDine (CATAPRES-TTS) transdermal patch 0.3 mg, 0.3 mg, Transdermal, Q7 Days  clopidogrel (PLAVIX) 75 mg tablet, 75 mg, Oral, Daily  cyclobenzaprine (FLEXERIL) tablet, 5 mg, Oral, 2x/day  PRN  doxazosin (CARDURA) tablet, 4 mg, Oral, QPM  furosemide (LASIX) tablet, 160 mg, Oral, Daily  guaiFENesin (MUCINEX) extended release tablet - for cough (expectorant), 600 mg, Oral, 2x/day  HYDROmorphone (DILAUDID) 2 mg/mL injection, 0.5 mg, Intravenous, Now  isosorbide mononitrate (IMDUR) 24 hr extended release tablet, 120 mg, Oral, QAM  labetalol (NORMODYNE) tablet, 300 mg, Oral, 2x/day  lidocaine (XYLOCAINE) 2% oral topical viscous solution, 15 mL, Oral, Once  loratadine (CLARITIN) tablet, 10 mg, Oral, Daily  LORazepam (ATIVAN) tablet, 1 mg, Oral, HS PRN  montelukast (SINGULAIR) 10 mg tablet, 10 mg, Oral, QPM  niCARdipine (CARDENE) 25 mg in NS 250 mL (tot vol) infusion, 7.5 mg/hr, Intravenous, Continuous  nitroGLYCERIN 50 mg in 250 ml D5W premix infusion, 10 mcg/min, Intravenous, Continuous  NS flush syringe, 3 mL, Intracatheter, Q8HRS  NS flush syringe, 3 mL, Intracatheter, Q1H PRN  ondansetron (ZOFRAN) 2 mg/mL injection, 4 mg, Intravenous, Q8H PRN  ondansetron (ZOFRAN) tablet, 4 mg, Oral, Q12H PRN  pantoprazole (PROTONIX) delayed release tablet, 40 mg, Oral, Daily  vancomycin (VANCOCIN) 1,500 mg in NS 500 mL IVPB, 20 mg/kg (Adjusted), Intravenous, Give in Dialysis  Vancomycin IV - Pharmacist to Dose per Protocol, , Does not apply, Daily PRN         Allergies   Allergen Reactions    Lisinopril Swelling     Tongue and throat swelling     Family Medical History:       Problem Relation (Age of Onset)    Breast Cancer Mother    Coronary Artery Disease Mother    Diabetes type II Mother    Hypertension (High Blood Pressure) Father            Social History     Tobacco Use    Smoking status: Every Day     Packs/day: 0.00     Types: Cigarettes    Smokeless tobacco: Former    Tobacco comments:     "Haven't had a cigerette in 2 weeks."   Substance Use Topics    Alcohol use: Not Currently         ROS:  General: No fever, No chills  Neck: No pain or swelling  Abdomen: No pain or change in bowel  All other ROS are  negative except for what is noted in HPI       IN's / OUT's:    I/O last 24 hours:      Intake/Output Summary (Last 24 hours) at 08/27/2021 3086  Last data filed  at 08/27/2021 0600  Gross per 24 hour   Intake 2413.06 ml   Output --   Net 2413.06 ml     I/O current shift:  No intake/output data recorded.      DIAGNOSTIC STUDIES:    Results for orders placed or performed during the hospital encounter of 08/26/21   COMPREHENSIVE METABOLIC PANEL, NON-FASTING   Result Value Ref Range    SODIUM 137 136 - 145 mmol/L    POTASSIUM 3.8 3.5 - 5.1 mmol/L    CHLORIDE 96 (L) 98 - 107 mmol/L    CO2 TOTAL 29 21 - 32 mmol/L    ANION GAP 12 10 - 20 mmol/L    BUN 34 (H) 7 - 18 mg/dL    CREATININE 10.06 (H) 0.55 - 1.02 mg/dL    BUN/CREA RATIO 3     ESTIMATED GFR 4 (L) >59 mL/min/1.18m^2    ALBUMIN 3.6 3.4 - 5.0 g/dL    CALCIUM 9.9 8.5 - 10.1 mg/dL    GLUCOSE 94 74 - 106 mg/dL    ALKALINE PHOSPHATASE 98 46 - 116 U/L    ALT (SGPT) 12 <=78 U/L    AST (SGOT) 17 15 - 37 U/L    BILIRUBIN TOTAL 0.9 0.2 - 1.0 mg/dL    PROTEIN TOTAL 8.6 (H) 6.4 - 8.2 g/dL    ALBUMIN/GLOBULIN RATIO 0.7 (L) 0.8 - 1.4    OSMOLALITY, CALCULATED 281 270 - 290 mOsm/kg    CALCIUM, CORRECTED 10.3 mg/dL    GLOBULIN 5.0    TROPONIN-I NOW   Result Value Ref Range    TROPONIN I 139 (H) <15 ng/L   TROPONIN-I IN ONE HOUR   Result Value Ref Range    TROPONIN I 128 (H) <15 ng/L   CBC WITH DIFF   Result Value Ref Range    WBCS UNCORRECTED      WBC 13.8 (H) 4.0 - 10.5 x10^3/uL    RBC 2.85 (L) 4.20 - 5.40 x10^6/uL    HGB 8.7 (L) 12.5 - 16.0 g/dL    HCT 26.6 (L) 37.0 - 47.0 %    MCV 93.2 78.0 - 99.0 fL    MCH 30.6 27.0 - 32.0 pg    MCHC 32.8 32.0 - 36.0 g/dL    RDW 18.9 (H) 11.6 - 14.8 %    PLATELETS 261 140 - 440 x10^3/uL    MPV 7.9 7.4 - 10.4 fL    NEUTROPHIL % 82 (H) 40 - 76 %    LYMPHOCYTE % 8 (L) 25 - 45 %    MONOCYTE % 7 0 - 12 %    EOSINOPHIL % 2 0 - 7 %    BASOPHIL % 0 0 - 3 %    NEUTROPHIL # 11.34 (H) 1.80 - 8.40 x10^3/uL    LYMPHOCYTE # 1.12 1.10 - 5.00 x10^3/uL    MONOCYTE  # 0.94 0.00 - 1.30 x10^3/uL    EOSINOPHIL # 0.30 0.00 - 0.80 x10^3/uL    BASOPHIL # 0.06 0.00 - 0.30 x10^3/uL   PT/INR   Result Value Ref Range    PROTHROMBIN TIME 12.6 9.8 - 12.7 seconds    INR 1.19 (H) 0.88 - 1.10   TROPONIN-I IN THREE HOURS   Result Value Ref Range    TROPONIN I 143 (H) <15 ng/L   CBC   Result Value Ref Range    WBCS UNCORRECTED 9.7 x10^3/uL    WBC 9.7 4.0 - 10.5 x10^3/uL    RBC 2.45 (L) 4.20 -  5.40 x10^6/uL    HGB 7.4 (L) 12.5 - 16.0 g/dL    HCT 21.9 (L) 37.0 - 47.0 %    MCV 89.4 78.0 - 99.0 fL    MCH 30.3 27.0 - 32.0 pg    MCHC 33.9 32.0 - 36.0 g/dL    RDW 18.7 (H) 11.6 - 14.8 %    PLATELETS 221 140 - 440 x10^3/uL    MPV 6.9 (L) 7.4 - 10.4 fL   BASIC METABOLIC PANEL   Result Value Ref Range    SODIUM 137 136 - 145 mmol/L    POTASSIUM 4.0 3.5 - 5.1 mmol/L    CHLORIDE 99 98 - 107 mmol/L    CO2 TOTAL 30 21 - 31 mmol/L    ANION GAP 8 (L) 10 - 20 mmol/L    CALCIUM 9.3 8.6 - 10.3 mg/dL    GLUCOSE 99 74 - 109 mg/dL    BUN 29 (H) 7 - 25 mg/dL    CREATININE 7.88 (H) 0.60 - 1.30 mg/dL    BUN/CREA RATIO 4 (L) 6 - 22    ESTIMATED GFR 6 (L) >59 mL/min/1.72m^2    OSMOLALITY, CALCULATED 280 270 - 290 mOsm/kg   ECG 12 LEAD   Result Value Ref Range    Ventricular rate 100 BPM    Atrial Rate 100 BPM    PR Interval 198 ms    QRS Duration 104 ms    QT Interval 374 ms    QTC Calculation 482 ms    Calculated P Axis 90 degrees    Calculated R Axis 78 degrees    Calculated T Axis 92 degrees       Radiology:  XR AP MOBILE CHEST  CT ANGIO CHEST ABDOMEN W CONTRAST  CT BRAIN WO IV CONTRAST      PHYSICAL EXAMINATION:    BP 126/71   Pulse 85   Temp 36.3 C (97.4 F)   Resp 13   Ht 1.702 m (5\' 7" )   Wt 83 kg (182 lb 15.7 oz)   SpO2 94%   BMI 28.66 kg/m         Exam limited due to telemed      Telemedicine Documentation:  Provider Physical Location: home  Patient/family aware of provider location: Yes  Patient/family consent for telemedicine: Yes  Examination observed and performed by: Noralee Stain, DO  Patient  Location: [facility], [address], [place], [place] [address]        Noralee Stain, DO

## 2021-08-28 ENCOUNTER — Observation Stay (HOSPITAL_COMMUNITY): Payer: Commercial Managed Care - PPO | Admitting: Internal Medicine

## 2021-08-28 ENCOUNTER — Ambulatory Visit (HOSPITAL_COMMUNITY): Payer: Commercial Managed Care - PPO

## 2021-08-28 ENCOUNTER — Observation Stay
Admission: EM | Admit: 2021-08-28 | Discharge: 2021-08-31 | Disposition: A | Discharge status: Home / Self Care | Payer: Commercial Managed Care - PPO | Attending: Internal Medicine | Admitting: Internal Medicine

## 2021-08-28 ENCOUNTER — Encounter (HOSPITAL_COMMUNITY): Payer: Self-pay

## 2021-08-28 ENCOUNTER — Telehealth (HOSPITAL_COMMUNITY): Payer: Self-pay

## 2021-08-28 ENCOUNTER — Other Ambulatory Visit: Payer: Self-pay

## 2021-08-28 DIAGNOSIS — N189 Chronic kidney disease, unspecified: Secondary | ICD-10-CM | POA: Diagnosis present

## 2021-08-28 DIAGNOSIS — Z87891 Personal history of nicotine dependence: Secondary | ICD-10-CM

## 2021-08-28 DIAGNOSIS — R0902 Hypoxemia: Secondary | ICD-10-CM

## 2021-08-28 DIAGNOSIS — J9621 Acute and chronic respiratory failure with hypoxia: Secondary | ICD-10-CM | POA: Diagnosis present

## 2021-08-28 DIAGNOSIS — R079 Chest pain, unspecified: Secondary | ICD-10-CM

## 2021-08-28 DIAGNOSIS — F17210 Nicotine dependence, cigarettes, uncomplicated: Secondary | ICD-10-CM | POA: Insufficient documentation

## 2021-08-28 DIAGNOSIS — N186 End stage renal disease: Secondary | ICD-10-CM | POA: Insufficient documentation

## 2021-08-28 DIAGNOSIS — I5032 Chronic diastolic (congestive) heart failure: Secondary | ICD-10-CM | POA: Insufficient documentation

## 2021-08-28 DIAGNOSIS — R042 Hemoptysis: Secondary | ICD-10-CM | POA: Insufficient documentation

## 2021-08-28 DIAGNOSIS — Z992 Dependence on renal dialysis: Secondary | ICD-10-CM

## 2021-08-28 DIAGNOSIS — K3184 Gastroparesis: Secondary | ICD-10-CM | POA: Insufficient documentation

## 2021-08-28 DIAGNOSIS — D649 Anemia, unspecified: Secondary | ICD-10-CM | POA: Diagnosis present

## 2021-08-28 DIAGNOSIS — D631 Anemia in chronic kidney disease: Secondary | ICD-10-CM | POA: Insufficient documentation

## 2021-08-28 DIAGNOSIS — I509 Heart failure, unspecified: Secondary | ICD-10-CM

## 2021-08-28 DIAGNOSIS — I132 Hypertensive heart and chronic kidney disease with heart failure and with stage 5 chronic kidney disease, or end stage renal disease: Secondary | ICD-10-CM | POA: Insufficient documentation

## 2021-08-28 DIAGNOSIS — N2581 Secondary hyperparathyroidism of renal origin: Secondary | ICD-10-CM | POA: Diagnosis present

## 2021-08-28 DIAGNOSIS — F17200 Nicotine dependence, unspecified, uncomplicated: Secondary | ICD-10-CM | POA: Insufficient documentation

## 2021-08-28 DIAGNOSIS — I517 Cardiomegaly: Secondary | ICD-10-CM | POA: Diagnosis present

## 2021-08-28 DIAGNOSIS — J9601 Acute respiratory failure with hypoxia: Principal | ICD-10-CM | POA: Insufficient documentation

## 2021-08-28 DIAGNOSIS — N19 Unspecified kidney failure: Secondary | ICD-10-CM

## 2021-08-28 DIAGNOSIS — I10 Essential (primary) hypertension: Secondary | ICD-10-CM | POA: Diagnosis present

## 2021-08-28 LAB — CBC WITH DIFF
BASOPHIL #: 0.2 10*3/uL (ref 0.00–0.30)
BASOPHIL %: 1 % (ref 0–3)
EOSINOPHIL #: 0.4 10*3/uL (ref 0.00–0.80)
EOSINOPHIL %: 3 % (ref 0–7)
HCT: 22.6 % — ABNORMAL LOW (ref 37.0–47.0)
HGB: 7.4 g/dL — ABNORMAL LOW (ref 12.5–16.0)
LYMPHOCYTE #: 0.8 10*3/uL — ABNORMAL LOW (ref 1.10–5.00)
LYMPHOCYTE %: 7 % — ABNORMAL LOW (ref 25–45)
MCH: 29.2 pg (ref 27.0–32.0)
MCHC: 32.6 g/dL (ref 32.0–36.0)
MCV: 89.7 fL (ref 78.0–99.0)
MONOCYTE #: 0.9 10*3/uL (ref 0.00–1.30)
MONOCYTE %: 7 % (ref 0–12)
MPV: 6.7 fL — ABNORMAL LOW (ref 7.4–10.4)
NEUTROPHIL #: 9.8 10*3/uL — ABNORMAL HIGH (ref 1.80–8.40)
NEUTROPHIL %: 82 % — ABNORMAL HIGH (ref 40–76)
PLATELETS: 224 10*3/uL (ref 140–440)
RBC: 2.52 10*6/uL — ABNORMAL LOW (ref 4.20–5.40)
RDW: 18.6 % — ABNORMAL HIGH (ref 11.6–14.8)
WBC: 12.1 10*3/uL — ABNORMAL HIGH (ref 4.0–10.5)
WBCS UNCORRECTED: 12.1 10*3/uL

## 2021-08-28 LAB — COMPREHENSIVE METABOLIC PANEL, NON-FASTING
ALBUMIN/GLOBULIN RATIO: 1.2 (ref 0.8–1.4)
ALBUMIN: 3.9 g/dL (ref 3.5–5.7)
ALKALINE PHOSPHATASE: 79 U/L (ref 34–104)
ALT (SGPT): <7 U/L — ABNORMAL LOW (ref 7–52)
ANION GAP: 12 mmol/L (ref 10–20)
AST (SGOT): 13 U/L (ref 13–39)
BILIRUBIN TOTAL: 0.6 mg/dL (ref 0.3–1.2)
BUN/CREA RATIO: 4 — ABNORMAL LOW (ref 6–22)
BUN: 39 mg/dL — ABNORMAL HIGH (ref 7–25)
CALCIUM, CORRECTED: 9.3 mg/dL (ref 8.9–10.8)
CALCIUM: 9.2 mg/dL (ref 8.6–10.3)
CHLORIDE: 99 mmol/L (ref 98–107)
CO2 TOTAL: 26 mmol/L (ref 21–31)
CREATININE: 9.87 mg/dL — ABNORMAL HIGH (ref 0.60–1.30)
ESTIMATED GFR: 5 mL/min/{1.73_m2} — ABNORMAL LOW (ref >59)
GLOBULIN: 3.2 (ref 2.9–5.4)
GLUCOSE: 86 mg/dL (ref 74–109)
OSMOLALITY, CALCULATED: 283 mOsm/kg (ref 270–290)
POTASSIUM: 4.3 mmol/L (ref 3.5–5.1)
PROTEIN TOTAL: 7.1 g/dL (ref 6.4–8.9)
SODIUM: 137 mmol/L (ref 136–145)

## 2021-08-28 LAB — ECG 12 LEAD
Atrial Rate: 111 {beats}/min
Calculated P Axis: 78 degrees
Calculated R Axis: 27 degrees
Calculated T Axis: 121 degrees
PR Interval: 180 ms
QRS Duration: 102 ms
QT Interval: 350 ms
QTC Calculation: 476 ms
Ventricular rate: 111 {beats}/min

## 2021-08-28 LAB — TROPONIN-I
TROPONIN I: 176 ng/L — ABNORMAL HIGH (ref <15)
TROPONIN I: 163 ng/L — ABNORMAL HIGH (ref <15)
TROPONIN I: 174 ng/L — ABNORMAL HIGH (ref <15)

## 2021-08-28 LAB — LAVENDER TOP TUBE

## 2021-08-28 LAB — POC BLOOD GLUCOSE (RESULTS): GLUCOSE, POC: 125 mg/dl (ref 50–500)

## 2021-08-28 LAB — VANCOMYCIN, TROUGH: VANCOMYCIN TROUGH: 29.3 ug/mL (ref 5.0–10.0)

## 2021-08-28 MED ORDER — MONTELUKAST 10 MG TABLET
10.0000 mg | ORAL_TABLET | Freq: Every evening | ORAL | Status: DC
Start: 2021-08-28 — End: 2021-08-31
  Administered 2021-08-28 – 2021-08-30 (×4): 10 mg via ORAL
  Filled 2021-08-28 (×3): qty 1

## 2021-08-28 MED ORDER — ISOSORBIDE MONONITRATE ER 60 MG TABLET,EXTENDED RELEASE 24 HR
ORAL_TABLET | ORAL | Status: AC
Start: 2021-08-28 — End: 2021-08-28
  Filled 2021-08-28: qty 2

## 2021-08-28 MED ORDER — GUAIFENESIN ER 600 MG TABLET, EXTENDED RELEASE 12 HR
EXTENDED_RELEASE_TABLET | ORAL | Status: AC
Start: 2021-08-28 — End: 2021-08-28
  Filled 2021-08-28: qty 1

## 2021-08-28 MED ORDER — INSULIN REGULAR HUMAN 100 UNIT/ML INJECTION SSIP
0.0000 [IU] | INJECTION | Freq: Four times a day (QID) | SUBCUTANEOUS | Status: DC | PRN
Start: 2021-08-28 — End: 2021-08-28

## 2021-08-28 MED ORDER — LABETALOL 200 MG TABLET
300.0000 mg | ORAL_TABLET | Freq: Two times a day (BID) | ORAL | Status: DC
Start: 2021-08-28 — End: 2021-08-31
  Administered 2021-08-28 – 2021-08-31 (×7): 300 mg via ORAL
  Filled 2021-08-28: qty 1.5
  Filled 2021-08-28 (×4): qty 2
  Filled 2021-08-28: qty 1.5
  Filled 2021-08-28: qty 2
  Filled 2021-08-28: qty 1.5
  Filled 2021-08-28: qty 2
  Filled 2021-08-28: qty 1.5

## 2021-08-28 MED ORDER — DEXTROSE 50 % IN WATER (D50W) INTRAVENOUS SYRINGE
25.0000 g | INJECTION | INTRAVENOUS | Status: DC | PRN
Start: 2021-08-28 — End: 2021-08-31

## 2021-08-28 MED ORDER — APIXABAN 5 MG TABLET
5.0000 mg | ORAL_TABLET | Freq: Two times a day (BID) | ORAL | Status: DC
Start: 2021-08-28 — End: 2021-08-31
  Administered 2021-08-28 – 2021-08-29 (×4): 5 mg via ORAL
  Administered 2021-08-30: 0 mg via ORAL
  Administered 2021-08-30: 5 mg via ORAL
  Filled 2021-08-28 (×6): qty 1

## 2021-08-28 MED ORDER — VANCOMYCIN 10 GRAM INTRAVENOUS SOLUTION
20.0000 mg/kg | INTRAVENOUS | Status: DC
Start: 2021-08-28 — End: 2021-08-28
  Filled 2021-08-28: qty 15

## 2021-08-28 MED ORDER — CLONIDINE 0.2 MG/24 HR WEEKLY TRANSDERMAL PATCH
0.3000 mg | MEDICATED_PATCH | TRANSDERMAL | Status: DC
Start: 2021-08-30 — End: 2021-08-31
  Administered 2021-08-30: 0.2 mg via TRANSDERMAL
  Filled 2021-08-28: qty 1

## 2021-08-28 MED ORDER — MONTELUKAST 10 MG TABLET
ORAL_TABLET | ORAL | Status: AC
Start: 2021-08-28 — End: 2021-08-28
  Filled 2021-08-28: qty 1

## 2021-08-28 MED ORDER — CALCITRIOL 0.25 MCG CAPSULE
1.0000 ug | ORAL_CAPSULE | ORAL | Status: DC
Start: 2021-08-28 — End: 2021-08-31
  Administered 2021-08-28 – 2021-08-30 (×2): 1 ug via ORAL
  Filled 2021-08-28 (×3): qty 4

## 2021-08-28 MED ORDER — GLUCAGON 1 MG/ML SOLUTION FOR INJECTION
1.0000 mg | INTRAMUSCULAR | Status: DC | PRN
Start: 2021-08-28 — End: 2021-08-31

## 2021-08-28 MED ORDER — MORPHINE 2 MG/ML INJECTION WRAPPER
2.0000 mg | INJECTION | INTRAMUSCULAR | Status: DC | PRN
Start: 2021-08-28 — End: 2021-08-31

## 2021-08-28 MED ORDER — LORATADINE 10 MG TABLET
ORAL_TABLET | ORAL | Status: AC
Start: 2021-08-28 — End: 2021-08-28
  Filled 2021-08-28: qty 1

## 2021-08-28 MED ORDER — NITROGLYCERIN 2 % TRANSDERMAL OINTMENT - PACKET
TOPICAL_OINTMENT | TRANSDERMAL | Status: AC
Start: 2021-08-28 — End: 2021-08-28
  Filled 2021-08-28: qty 1

## 2021-08-28 MED ORDER — CYCLOBENZAPRINE 10 MG TABLET
5.0000 mg | ORAL_TABLET | Freq: Two times a day (BID) | ORAL | Status: DC | PRN
Start: 2021-08-28 — End: 2021-08-31
  Administered 2021-08-29: 5 mg via ORAL
  Filled 2021-08-28: qty 1

## 2021-08-28 MED ORDER — ACETAMINOPHEN 325 MG TABLET
650.0000 mg | ORAL_TABLET | ORAL | Status: DC | PRN
Start: 2021-08-28 — End: 2021-08-31

## 2021-08-28 MED ORDER — VANCOMYCIN IV - PHARMACIST TO DOSE PER PROTOCOL
Freq: Every day | Status: DC | PRN
Start: 2021-08-28 — End: 2021-08-31

## 2021-08-28 MED ORDER — APIXABAN 5 MG TABLET
ORAL_TABLET | ORAL | Status: AC
Start: 2021-08-28 — End: 2021-08-28
  Filled 2021-08-28: qty 1

## 2021-08-28 MED ORDER — CLOPIDOGREL 75 MG TABLET
ORAL_TABLET | ORAL | Status: AC
Start: 2021-08-28 — End: 2021-08-28
  Filled 2021-08-28: qty 1

## 2021-08-28 MED ORDER — NITROGLYCERIN 2 % TRANSDERMAL OINTMENT - PACKET
1.0000 [in_us] | TOPICAL_OINTMENT | TRANSDERMAL | Status: AC
Start: 2021-08-28 — End: 2021-08-28
  Administered 2021-08-28: 1 [in_us] via TOPICAL

## 2021-08-28 MED ORDER — FUROSEMIDE 40 MG TABLET
ORAL_TABLET | ORAL | Status: AC
Start: 2021-08-28 — End: 2021-08-28
  Filled 2021-08-28: qty 4

## 2021-08-28 MED ORDER — GUAIFENESIN ER 600 MG TABLET, EXTENDED RELEASE 12 HR
600.0000 mg | EXTENDED_RELEASE_TABLET | Freq: Two times a day (BID) | ORAL | Status: DC
Start: 2021-08-28 — End: 2021-08-31
  Administered 2021-08-28 – 2021-08-31 (×7): 600 mg via ORAL
  Filled 2021-08-28 (×6): qty 1

## 2021-08-28 MED ORDER — FUROSEMIDE 40 MG TABLET
160.0000 mg | ORAL_TABLET | Freq: Every day | ORAL | Status: DC
Start: 2021-08-28 — End: 2021-08-31
  Administered 2021-08-28 – 2021-08-31 (×4): 160 mg via ORAL
  Filled 2021-08-28 (×3): qty 4

## 2021-08-28 MED ORDER — PANTOPRAZOLE 40 MG TABLET,DELAYED RELEASE
40.0000 mg | DELAYED_RELEASE_TABLET | Freq: Every day | ORAL | Status: DC
Start: 2021-08-28 — End: 2021-08-31
  Administered 2021-08-28 – 2021-08-31 (×4): 40 mg via ORAL
  Filled 2021-08-28 (×3): qty 1

## 2021-08-28 MED ORDER — INSULIN REGULAR HUMAN 100 UNIT/ML INJECTION SSIP
0.0000 [IU] | INJECTION | Freq: Four times a day (QID) | SUBCUTANEOUS | Status: DC
Start: 2021-08-28 — End: 2021-08-31
  Administered 2021-08-28 – 2021-08-31 (×12): 0 [IU] via SUBCUTANEOUS

## 2021-08-28 MED ORDER — PANTOPRAZOLE 40 MG TABLET,DELAYED RELEASE
DELAYED_RELEASE_TABLET | ORAL | Status: AC
Start: 2021-08-28 — End: 2021-08-28
  Filled 2021-08-28: qty 1

## 2021-08-28 MED ORDER — DOXAZOSIN 4 MG TABLET
4.0000 mg | ORAL_TABLET | Freq: Every evening | ORAL | Status: DC
Start: 2021-08-28 — End: 2021-08-31
  Administered 2021-08-28 – 2021-08-30 (×4): 4 mg via ORAL
  Filled 2021-08-28 (×6): qty 1

## 2021-08-28 MED ORDER — ISOSORBIDE MONONITRATE ER 60 MG TABLET,EXTENDED RELEASE 24 HR
120.0000 mg | ORAL_TABLET | Freq: Every morning | ORAL | Status: DC
Start: 2021-08-28 — End: 2021-08-31
  Administered 2021-08-28 – 2021-08-31 (×4): 120 mg via ORAL
  Filled 2021-08-28 (×3): qty 2

## 2021-08-28 MED ORDER — DILTIAZEM CD 180 MG CAPSULE,EXTENDED RELEASE 24 HR
180.0000 mg | ORAL_CAPSULE | Freq: Every day | ORAL | Status: DC
Start: 2021-08-28 — End: 2021-08-31
  Administered 2021-08-28 – 2021-08-31 (×4): 180 mg via ORAL
  Filled 2021-08-28 (×7): qty 1

## 2021-08-28 MED ORDER — DEXTROSE 50 % IN WATER (D50W) INTRAVENOUS SYRINGE
12.5000 g | INJECTION | INTRAVENOUS | Status: DC | PRN
Start: 2021-08-28 — End: 2021-08-31

## 2021-08-28 MED ORDER — LORATADINE 10 MG TABLET
10.0000 mg | ORAL_TABLET | Freq: Every day | ORAL | Status: DC
Start: 2021-08-28 — End: 2021-08-31
  Administered 2021-08-28 – 2021-08-31 (×4): 10 mg via ORAL
  Filled 2021-08-28 (×3): qty 1

## 2021-08-28 MED ORDER — VANCOMYCIN 10 GRAM INTRAVENOUS SOLUTION
20.0000 mg/kg | Freq: Once | INTRAVENOUS | Status: DC
Start: 2021-08-28 — End: 2021-08-28
  Administered 2021-08-28: 0 mg via INTRAVENOUS
  Filled 2021-08-28 (×2): qty 15

## 2021-08-28 MED ORDER — CINACALCET 30 MG TABLET
30.0000 mg | ORAL_TABLET | Freq: Every evening | ORAL | Status: DC
Start: 2021-08-28 — End: 2021-08-31
  Administered 2021-08-28 – 2021-08-30 (×4): 30 mg via ORAL
  Filled 2021-08-28 (×6): qty 1

## 2021-08-28 MED ORDER — ONDANSETRON HCL 4 MG TABLET
4.0000 mg | ORAL_TABLET | Freq: Two times a day (BID) | ORAL | Status: DC | PRN
Start: 2021-08-28 — End: 2021-08-31
  Filled 2021-08-28 (×2): qty 1

## 2021-08-28 MED ORDER — CLOPIDOGREL 75 MG TABLET
75.0000 mg | ORAL_TABLET | Freq: Every day | ORAL | Status: DC
Start: 2021-08-28 — End: 2021-08-31
  Administered 2021-08-28 – 2021-08-31 (×4): 75 mg via ORAL
  Filled 2021-08-28 (×3): qty 1

## 2021-08-28 NOTE — ED Nurses Note (Signed)
Report to [place] on 3E at this time.

## 2021-08-28 NOTE — Care Plan (Signed)
Problem: Skin Injury Risk Increased  Goal: Skin Health and Integrity  Outcome: Ongoing (see interventions/notes)     Problem: Chronic Kidney Disease  Goal: Optimal Coping with Chronic Illness  Outcome: Ongoing (see interventions/notes)     Problem: Adult Inpatient Plan of Care  Goal: Absence of Hospital-Acquired Illness or Injury  Outcome: Ongoing (see interventions/notes)

## 2021-08-28 NOTE — Nurses Notes (Signed)
PT received to room 319A from ER. PT alert and oriented, very drowsy upon arrival. PT states she was just discharged yesterday. Dialysis consent signed. Hospitalist notified of H&H 7.4 and 22.6, BUN 39 and CREAT 9.87. no new orders received. 02 3lnc in use.

## 2021-08-28 NOTE — Care Plan (Signed)
Problem: Adult Inpatient Plan of Care  Goal: Absence of Hospital-Acquired Illness or Injury  Outcome: Ongoing (see interventions/notes)     Problem: Skin Injury Risk Increased  Goal: Skin Health and Integrity  Outcome: Ongoing (see interventions/notes)     Problem: Chronic Kidney Disease  Goal: Optimal Coping with Chronic Illness  Outcome: Ongoing (see interventions/notes)

## 2021-08-28 NOTE — ED Triage Notes (Signed)
Ems called for chest pain with low 02 SAT. Upon EMS arrival pt 02 SAT 88%. 20g LFA, ASA 324mg , Nitro x 1.

## 2021-08-28 NOTE — ED Provider Notes (Signed)
Emergency Medicine    Name: [name]  Age and Gender: 46 y.o. female  Date of Birth: 19-Dec-1975  MRN: [id number]  PCP: Cam Hai, FNP    CC:  Chief Complaint   Patient presents with   . Chest Pain        HPI:  [name] is a 46 y.o. Black/African American female with history of chest pain which started at 2100. It was associated with nausea and shortness of breath. She has also had recent cough but denies fever.    She is on hemodialysis and usually runs Monday, Wednesday and Friday.     She had dialysis here on 08/26/21.  During her stay her presentation was concerning for coronary ischemia but discharge note states that she declined myocardial perfusion scan.    She denies heavy lifting or straining or cough that might cause her chest pain.      [place] Pain Rating Scale     On a scale of 0-10, during the past 24 hours, pain has interfered with you usual activity:       On a scale of 0-10, during the past 24 hours, pain has interfered with your sleep:      On a scale of 0-10, during the past 24 hours, pain has affected your mood:       On a scale of 0-10, during the past 24 hours, pain has contributed to your stress:       On a scale of 0-10, what is your overall pain Rating: 3        Below pertinent information reviewed with patient:  Past Medical History:   Diagnosis Date   . Asthma    . Chronic diastolic CHF (congestive heart failure) (CMS HCC)    . Esophageal reflux    . ESRD (end stage renal disease) (CMS [facility])    . History of anemia due to CKD    . Hypertension    . MI (myocardial infarction) (CMS [facility])    . Mitral valve regurgitation    . Pulmonary edema    . Sleep apnea    . Tricuspid valve regurgitation            Allergies   Allergen Reactions   . Lisinopril Swelling     Tongue and throat swelling       Past Surgical History:   Procedure Laterality Date   . ESOPHAGOGASTRODUODENOSCOPY     . HX BACK SURGERY     . HX CHOLECYSTECTOMY     . HX FOOT SURGERY Right    . HX HYSTERECTOMY     . HX  TONSILLECTOMY             Social History        Objective:    ED Triage Vitals [08/28/21 0203]   BP (Non-Invasive) (!) 178/112   Heart Rate 98   Respiratory Rate 20   Temperature 37.1 C (98.8 F)   SpO2 94 %   Weight 83 kg (182 lb 15.7 oz)   Height 1.702 m (5\' 7" )     Filed Vitals:    08/28/21 0203 08/28/21 0230 08/28/21 0400 08/28/21 0430   BP: (!) 178/112 (!) 179/97  (!) 163/116   Pulse: 98 95 (!) 102 (!) 109   Resp: 20 (!) 21 17 16    Temp: 37.1 C (98.8 F)      SpO2: 94% 91% (!) 88% 93%       Nursing notes and vital  signs reviewed.    Constitutional - No acute distress.  Alert and Active.  HEENT - Normocephalic. Atraumatic. PERRL. EOMI. Conjunctiva clear. Oropharynx with no erythema, lesions, or exudates. Moist mucous membranes.   Neck - Trachea midline. No stridor. No hoarseness.  Cardiac - Tachycardia, regular.  No murmurs, rubs, or gallops.  Respiratory - Clear to auscultation bilaterally. Bilateral ronchi.  Abdomen - Non-tender, soft, non-distended. No rebound or guarding.   Musculoskeletal - Good AROM. No muscle or joint tenderness appreciated. No clubbing, cyanosis or edema.  Skin - Warm and dry, without any rashes or other lesions.  Neuro - Cranial nerves II-XII are grossly intact.  Moving all extremities symmetrically.    Any pertinent labs and imaging obtained during this encounter reviewed below in MDM.    MDM/ED Course:    Medical Decision Making  Amount and/or Complexity of Data Reviewed  Labs: ordered. Decision-making details documented in ED Course.  Radiology: ordered. Decision-making details documented in ED Course.  ECG/medicine tests: ordered. Decision-making details documented in ED Course.         EKG shows a sinus tachycardia, rate of 111. Normal PR. qRS 102. No STEMI. LVH.  Lateral and inferior ST depressions noted.    She is not normally on home oxygen. However, here on room air her SaO2 is about 87-88%.  Her BP is near baseline for her, and she often has elevated troponins.    I spoke  with Dr. Deloria Lair, hospitalist, who will admit her and consult nephrology as she may be symptomatic from fluid overload.  She may need dialysis today. It is Wednesday and he is due for dialysis today anyway.    Impression:   Clinical Impression   Kidney failure (Primary)   Hypoxia   Congestive heart failure, unspecified HF chronicity, unspecified heart failure type (CMS HCC)       Disposition: Admitted          Portions of this note may have been dictated using voice recognition software.     Cruzita Lederer, MD  [facility] ED    -----------------------  Results for orders placed or performed during the hospital encounter of 08/28/21 (from the past 12 hour(s))   COMPREHENSIVE METABOLIC PANEL, NON-FASTING   Result Value Ref Range    SODIUM 137 136 - 145 mmol/L    POTASSIUM 4.3 3.5 - 5.1 mmol/L    CHLORIDE 99 98 - 107 mmol/L    CO2 TOTAL 26 21 - 31 mmol/L    ANION GAP 12 10 - 20 mmol/L    BUN 39 (H) 7 - 25 mg/dL    CREATININE 9.87 (H) 0.60 - 1.30 mg/dL    BUN/CREA RATIO 4 (L) 6 - 22    ESTIMATED GFR 5 (L) >59 mL/min/1.66m^2    ALBUMIN 3.9 3.5 - 5.7 g/dL    CALCIUM 9.2 8.6 - 10.3 mg/dL    GLUCOSE 86 74 - 109 mg/dL    ALKALINE PHOSPHATASE 79 34 - 104 U/L    ALT (SGPT) <7 (L) 7 - 52 U/L    AST (SGOT) 13 13 - 39 U/L    BILIRUBIN TOTAL 0.6 0.3 - 1.2 mg/dL    PROTEIN TOTAL 7.1 6.4 - 8.9 g/dL    ALBUMIN/GLOBULIN RATIO 1.2 0.8 - 1.4    OSMOLALITY, CALCULATED 283 270 - 290 mOsm/kg    CALCIUM, CORRECTED 9.3 8.9 - 10.8 mg/dL    GLOBULIN 3.2 2.9 - 5.4   TROPONIN NOW   Result Value Ref Range  TROPONIN I 176 (H) <15 ng/L   CBC WITH DIFF   Result Value Ref Range    WBCS UNCORRECTED 12.1 x10^3/uL    WBC 12.1 (H) 4.0 - 10.5 x10^3/uL    RBC 2.52 (L) 4.20 - 5.40 x10^6/uL    HGB 7.4 (L) 12.5 - 16.0 g/dL    HCT 22.6 (L) 37.0 - 47.0 %    MCV 89.7 78.0 - 99.0 fL    MCH 29.2 27.0 - 32.0 pg    MCHC 32.6 32.0 - 36.0 g/dL    RDW 18.6 (H) 11.6 - 14.8 %    PLATELETS 224 140 - 440 x10^3/uL    MPV 6.7 (L) 7.4 - 10.4 fL    NEUTROPHIL  % 82 (H) 40 - 76 %    LYMPHOCYTE % 7 (L) 25 - 45 %    MONOCYTE % 7 0 - 12 %    EOSINOPHIL % 3 0 - 7 %    BASOPHIL % 1 0 - 3 %    NEUTROPHIL # 9.80 (H) 1.80 - 8.40 x10^3/uL    LYMPHOCYTE # 0.80 (L) 1.10 - 5.00 x10^3/uL    MONOCYTE # 0.90 0.00 - 1.30 x10^3/uL    EOSINOPHIL # 0.40 0.00 - 0.80 x10^3/uL    BASOPHIL # 0.20 0.00 - 0.30 x10^3/uL   TROPONIN IN ONE HOUR   Result Value Ref Range    TROPONIN I 163 (H) <15 ng/L     XR AP MOBILE CHEST    (Results Pending)

## 2021-08-28 NOTE — Consults (Signed)
[facility]   Nephrology Consult      [name], [place], 46 y.o. female  Encounter Start Date:  08/28/2021  Inpatient Admission Date:    Date of Birth:  [birth year]    Admitting Provider:  Hospitalist service    Reason for Consult:  End-stage renal disease on hemodialysis    HPI:  This is a 46 year old black female patient known to have hypertension end-stage renal disease hemodialysis Monday Wednesday Friday came in with chest pain she was just recently discharged yesterday from the hospital will continue dialysis as scheduled Monday Wednesday Friday anemia of chronic kidney disease treatment per protocol given will be given Venofer and Epogen per protocol hypertension keep blood pressure controlled    Past Medical History:   Diagnosis Date   . Asthma    . Chronic diastolic CHF (congestive heart failure) (CMS HCC)    . Esophageal reflux    . ESRD (end stage renal disease) (CMS [place])    . History of anemia due to CKD    . Hypertension    . MI (myocardial infarction) (CMS [place])    . Mitral valve regurgitation    . Pulmonary edema    . Sleep apnea    . Tricuspid valve regurgitation          Medications Prior to Admission     Prescriptions    acetaminophen (TYLENOL) 325 mg Oral Tablet    Take 2 Tablets (650 mg total) by mouth Every 4 hours as needed    albuterol sulfate (PROVENTIL OR VENTOLIN OR PROAIR) 90 mcg/actuation Inhalation oral inhaler    Take 1-2 Puffs by inhalation Every 6 hours as needed    apixaban (ELIQUIS) 5 mg Oral Tablet    Take 1 Tablet (5 mg total) by mouth Twice daily for 30 days    budesonide-formoteroL (SYMBICORT) 160-4.5 mcg/actuation Inhalation oral inhaler    Take 2 Puffs by inhalation Twice daily    calcitrioL (ROCALTROL) 0.5 mcg Oral Capsule    Take 2 Capsules (1 mcg total) by mouth Every Monday, Wednesday and Friday    cinacalcet (SENSIPAR) 30 mg Oral Tablet    Take 1 Tablet (30 mg total) by mouth Every evening    cloNIDine (CATAPRES-TTS) 0.3 mg/24 hr Transdermal Patch Weekly     Place 1 Patch (0.3 mg total) on the skin Every 7 days    clopidogreL (PLAVIX) 75 mg Oral Tablet    Take 1 Tablet (75 mg total) by mouth Once a day    cyclobenzaprine (FLEXERIL) 5 mg Oral Tablet    Take 1 Tablet (5 mg total) by mouth Twice per day as needed for Muscle spasms    dilTIAZem (CARDIZEM CD) 180 mg Oral Capsule, Sust. Release 24 hr    Take 1 Capsule (180 mg total) by mouth Once a day for 30 days    doxazosin (CARDURA) 4 mg Oral Tablet    Take 1 Tablet (4 mg total) by mouth Every evening    ferric citrate (AURYXIA ORAL)    Take 1 g by mouth Three times daily before meals "Take 2 tablets three times daily before meals and 1 tablet before snacks"    furosemide (LASIX) 80 mg Oral Tablet    Take 2 Tablets (160 mg total) by mouth Once a day Take after dialysis on dialysis days; otherwise take in morning daily.    guaiFENesin (MUCINEX) 600 mg Oral Tablet Extended Release 12hr    Take 1 Tablet (600 mg total) by mouth Every  12 hours    isosorbide mononitrate (IMDUR) 120 mg Oral Tablet Sustained Release 24 hr    Take 1 Tablet (120 mg total) by mouth Every morning for 30 days    labetaloL (NORMODYNE) 300 mg Oral Tablet    Take 1 Tablet (300 mg total) by mouth Every 12 hours for 30 days    loratadine (CLARITIN) 10 mg Oral Tablet    Take 1 Tablet (10 mg total) by mouth Once a day    montelukast (SINGULAIR) 10 mg Oral Tablet    Take 1 Tablet (10 mg total) by mouth Every evening    nitroGLYCERIN (NITROSTAT) 0.4 mg Sublingual Tablet, Sublingual    Place 1 Tablet (0.4 mg total) under the tongue Every 5 minutes for 3 doses for 3 doses over 15 minutes    ondansetron (ZOFRAN) 4 mg Oral Tablet    Take 1 Tablet (4 mg total) by mouth Every 12 hours as needed for Nausea/Vomiting    pantoprazole (PROTONIX) 40 mg Oral Tablet, Delayed Release (E.C.)    Take 1 Tablet (40 mg total) by mouth Once a day    vancomycin 1,500 mg in NS 15 mL infusion    1,500 mg Give in Dialysis Mix and infuse per policy of Home [facility]fusion Pharm[facility].         acetaminophen (TYLENOL) tablet, 650 mg, Oral, Q4H PRN  apixaban (ELIQUIS) tablet, 5 mg, Oral, 2x/day  calcitriol (ROCALTROL) capsule, 1 mcg, Oral, M, W, and F  cinacalcet (SENSIPAR) tablet, 30 mg, Oral, QPM  [START ON 08/30/2021] cloNIDine (CATAPRES-TTS) transdermal patch 0.3 mg, 0.3 mg, Transdermal, Q7 Days  clopidogrel (PLAVIX) 75 mg tablet, 75 mg, Oral, Daily  cyclobenzaprine (FLEXERIL) tablet, 5 mg, Oral, 2x/day PRN  dextrose 50% (0.5 g/mL) injection - syringe, 25 g, Intravenous, Q15 Min PRN   Or  dextrose 50% (0.5 g/mL) injection - syringe, 12.5 g, Intravenous, Q15 Min PRN   Or  glucagon (GLUCAGEN DIAGNOSTIC KIT) injection 1 mg, 1 mg, Subcutaneous, Q15 Min PRN   Or  glucagon (GLUCAGEN DIAGNOSTIC KIT) injection 1 mg, 1 mg, IntraMUSCULAR, Q15 Min PRN  dilTIAZem (CARDIZEM CD) 24 hr extended release capsule, 180 mg, Oral, Daily  doxazosin (CARDURA) tablet, 4 mg, Oral, QPM  furosemide (LASIX) tablet, 160 mg, Oral, Daily  guaiFENesin (MUCINEX) extended release tablet - for cough (expectorant), 600 mg, Oral, Q12H  isosorbide mononitrate (IMDUR) 24 hr extended release tablet, 120 mg, Oral, QAM  labetalol (NORMODYNE) tablet, 300 mg, Oral, Q12H  loratadine (CLARITIN) tablet, 10 mg, Oral, Daily  montelukast (SINGULAIR) 10 mg tablet, 10 mg, Oral, QPM  morphine 2 mg/mL injection, 2 mg, Intravenous, Q4H PRN  ondansetron (ZOFRAN) tablet, 4 mg, Oral, Q12H PRN  pantoprazole (PROTONIX) delayed release tablet, 40 mg, Oral, Daily  SSIP insulin R human (HUMULIN R) 100 units/mL injection, 0-12 Units, Subcutaneous, 4x/day AC  Vancomycin IV - Pharmacist to Dose per Protocol, , Does not apply, Daily PRN      Allergies   Allergen Reactions   . Lisinopril Swelling     Tongue and throat swelling       ROS:   Constitutional: negative for fevers, chills, sweats, and fatigue  Eyes: negative for visual disturbance, irritation, redness, and icterus  Ears, nose, mouth, throat, and face: negative for hearing loss, tinnitus, ear drainage, nasal  congestion, epistaxis, and sore throat  Respiratory: negative for cough, sputum, hemoptysis, wheezing, or dyspnea on exertion  Cardiovascular: negative for chest pain, palpitations, , orthopnea, paroxysmal nocturnal dyspnea, and lower extremity edema  Gastrointestinal: negative for  nausea, vomiting, melena, diarrhea, constipation, and abdominal pain  Genitourinary:negative for frequency, dysuria, nocturia, urinary incontinence, hesitancy, and hematuria  Integument/breast: negative for rash, skin lesion(s), and pruritus  Hematologic/lymphatic: negative for easy bruising, bleeding, and petechiae  Musculoskeletal:negative for myalgias, arthralgias, neck pain, back pain, and muscle weakness  Neurological: negative for headaches, dizziness, seizures, speech problems, tremor, and weakness  Behavioral/Psych: negative for anxiety, behavior problems, mood swings, and sleep disturbance  Endocrine: negative for temperature intolerance  Allergic/Immunologic: negative for urticaria and angioedema      EXAM:  Filed Vitals:    08/28/21 1041 08/28/21 1600 08/28/21 1605 08/28/21 1850   BP: (!) 147/96 (!) 146/104 (!) 154/101 (!) 144/89   Pulse: 86 80  79   Resp: _0 Temp: 37.1 C (98.8 F) 36.4 C (97.5 F)  36.9 C (98.4 F)   SpO2: 90% 98%  98%       Constitutional: Alert and Oriented time person and place . Not in acute distress  HEENT : Vision and hearing grossly normal   Eyes: Conjunctiva clear., Pupils equal and round, reactive to light and accomodation. , Sclera non-icteric.   ENT: Nose without erythema. , Mouth mucous membranes moist.   Neck: JVD negative no thyromegaly or lymphadenopathy and supple, symmetrical, trachea midline  Respiratory: Clear to auscultation bilaterally. No wheezes, No rales, Good air exchange bilaterally  Cardiovascular: regular rate and rhythm no murmurs no rub  Gastrointestinal: Soft, non-tender, Bowel sounds normal, No hepatosplenomegaly  Genitourinary: no suprapubic tenderness  Lower  Extremities : No edema . Pulses + 4 bilateral   Neurologic: Grossly normal. Speech clear.   Psychiatric: Normal mood and affect,   Labs:         CBC Results Differential Results   Recent Labs     08/26/21  0425 08/27/21  0459 08/28/21  0302   WBC 13.8* 9.7 12.1*   HGB 8.7* 7.4* 7.4*   HCT 26.6* 21.9* 22.6*    Recent Labs     08/26/21  0425 08/28/21  0302   PMNS 82* 82*   LYMPHOCYTES 8* 7*   MONOCYTES 7 7   EOSINOPHIL 2 3   BASOPHILS 0  0.06 1  0.20   PMNABS 11.34* 9.80*   LYMPHSABS 1.12 0.80*   MONOSABS 0.94 0.90   EOSABS 0.30 0.40      BMP Results Other Chemistries Results   Recent Labs     08/26/21  0425 08/27/21  0459 08/28/21  0302   SODIUM 137 137 137   POTASSIUM 3.8 4.0 4.3   CHLORIDE 96* 99 99   CO2 _1 BUN 34* 29* 39*   CREATININE 10.06* 7.88* 9.87*   GFR 4* 6* 5*   ANIONGAP 12 8* 12    Recent Labs     08/26/21  0425 08/27/21  0459 08/28/21  0302   CALCIUM 9.9 9.3 9.2   ALBUMIN 3.6  --  3.9      Liver/Pancreas Enzyme Results Blood Gas     Recent Labs     08/26/21  0425 08/28/21  0302   TOTALPROTEIN 8.6* 7.1   ALBUMIN 3.6 3.9   AST 17 13   ALT 12 <7*   ALKPHOS 98 79    No results found for this encounter   Cardiac Results    Coags Results     TROPONIN I  Lab Results   Component Value Date    UHCEASTTROPI 0.06 (  HH) 12/13/2020    TROPONINI 174 (H) 08/28/2021    TROPONINI 163 (H) 08/28/2021    TROPONINI 176 (H) 08/28/2021         Recent Labs     08/26/21  0425   INR 1.19*          Imaging Studies:    XR AP MOBILE CHEST   Final Result   Worsening pulmonary edema         Radiologist location ID: [id number]              Assessment/Plan:  Active Hospital Problems    Diagnosis   . Primary Problem: Hypoxia   . Anemia in chronic kidney disease   . LVH (left ventricular hypertrophy)   . ESRD (end stage renal disease) on dialysis (CMS [facility])   . Secondary hyperparathyroidism of renal origin (CMS [facility])      End-stage renal disease hemodialysis Monday Wednesday Friday continue dialysis as scheduled   anemia of  chronic kidney disease treatment per protocol check PTH anemia profile  Essential hypertension keep blood pressure controlled  Chest pain management per her medical team      Richardean Sale, MD

## 2021-08-28 NOTE — H&P (Signed)
[facility]  History and Physical    Date of Service:  08/28/2021  [name], [name], 46 y.o. female  Encounter Start Date:  08/28/2021  Inpatient Admission Date:   Date of Birth:  [birth year]  PCP: Cam Hai, FNP       Chief Complaint:  Chest Pain     HPI: [name] is a 46 y.o., 46 American female who presents with chest pain and increased shortness of breath.  Patient is well known to hospitalist service.  Patient was seen examined at bedside.  Patient was recently discharged on 08/27/2021 with similar complaints.  Patient states that she feels that her chest pain as secondary to her increased shortness of breath.  Troponin I has been elevated and is chronically elevated.  Patient has history of hypertensive nephropathy with end-stage renal disease and on hemodialysis (Monday Wednesday Friday schedule).  Of note patient had a recent SVC thrombus causing SVC syndrome and had a thrombectomy and replacement of HD catheter at [facility] was started on Eliquis.  During that visit patient was also found to have Staph epidermidis bacteremia and started on a 6 week course of vancomycin during dialysis treatments per ID.  On August 26, 2021 the patient's his BP was in the 240 the patient was started on a nitroglycerin drip and then later transition to a nicardipine.  She patient also had hemodialysis at that time that improved her BP.  During his stay there was also some concern of coronary ischemia and cardiologist recommended that a myocardial perfusion scan which she refused. She patient back today stating that she is having conversational dyspnea.  Patient did have some wheezing on exam.  Patient denies any home O2 use.  O2 sat was in the mid 80s on room air in the ED, and is now up to the mid 90s on 2 L of O2 via NC.  Patient denies any other complaints at this time.  Patient will be placed in observation for further evaluation.    Past Medical History:    Past Medical  History:   Diagnosis Date   . Asthma    . Chronic diastolic CHF (congestive heart failure) (CMS [facility])    . Esophageal reflux    . ESRD (end stage renal disease) (CMS [place])    . History of anemia due to CKD    . Hypertension    . MI (myocardial infarction) (CMS [place])    . Mitral valve regurgitation    . Pulmonary edema    . Sleep apnea    . Tricuspid valve regurgitation              Medications Prior to Admission     Prescriptions    acetaminophen (TYLENOL) 325 mg Oral Tablet    Take 2 Tablets (650 mg total) by mouth Every 4 hours as needed    albuterol sulfate (PROVENTIL OR VENTOLIN OR PROAIR) 90 mcg/actuation Inhalation oral inhaler    Take 1-2 Puffs by inhalation Every 6 hours as needed    apixaban (ELIQUIS) 5 mg Oral Tablet    Take 1 Tablet (5 mg total) by mouth Twice daily for 30 days    budesonide-formoteroL (SYMBICORT) 160-4.5 mcg/actuation Inhalation oral inhaler    Take 2 Puffs by inhalation Twice daily    calcitrioL (ROCALTROL) 0.5 mcg Oral Capsule    Take 2 Capsules (1 mcg total) by mouth Every Monday, Wednesday and Friday    cinacalcet (SENSIPAR) 30 mg Oral Tablet  Take 1 Tablet (30 mg total) by mouth Every evening    cloNIDine (CATAPRES-TTS) 0.3 mg/24 hr Transdermal Patch Weekly    Place 1 Patch (0.3 mg total) on the skin Every 7 days    clopidogreL (PLAVIX) 75 mg Oral Tablet    Take 1 Tablet (75 mg total) by mouth Once a day    cyclobenzaprine (FLEXERIL) 5 mg Oral Tablet    Take 1 Tablet (5 mg total) by mouth Twice per day as needed for Muscle spasms    dilTIAZem (CARDIZEM CD) 180 mg Oral Capsule, Sust. Release 24 hr    Take 1 Capsule (180 mg total) by mouth Once a day for 30 days    doxazosin (CARDURA) 4 mg Oral Tablet    Take 1 Tablet (4 mg total) by mouth Every evening    ferric citrate (AURYXIA ORAL)    Take 1 g by mouth Three times daily before meals "Take 2 tablets three times daily before meals and 1 tablet before snacks"    furosemide (LASIX) 80 mg Oral Tablet    Take 2 Tablets (160 mg total) by  mouth Once a day Take after dialysis on dialysis days; otherwise take in morning daily.    guaiFENesin (MUCINEX) 600 mg Oral Tablet Extended Release 12hr    Take 1 Tablet (600 mg total) by mouth Every 12 hours    isosorbide mononitrate (IMDUR) 120 mg Oral Tablet Sustained Release 24 hr    Take 1 Tablet (120 mg total) by mouth Every morning for 30 days    labetaloL (NORMODYNE) 300 mg Oral Tablet    Take 1 Tablet (300 mg total) by mouth Every 12 hours for 30 days    loratadine (CLARITIN) 10 mg Oral Tablet    Take 1 Tablet (10 mg total) by mouth Once a day    montelukast (SINGULAIR) 10 mg Oral Tablet    Take 1 Tablet (10 mg total) by mouth Every evening    nitroGLYCERIN (NITROSTAT) 0.4 mg Sublingual Tablet, Sublingual    Place 1 Tablet (0.4 mg total) under the tongue Every 5 minutes for 3 doses for 3 doses over 15 minutes    ondansetron (ZOFRAN) 4 mg Oral Tablet    Take 1 Tablet (4 mg total) by mouth Every 12 hours as needed for Nausea/Vomiting    pantoprazole (PROTONIX) 40 mg Oral Tablet, Delayed Release (E.C.)    Take 1 Tablet (40 mg total) by mouth Once a day    vancomycin 1,500 mg in NS 15 mL infusion    1,500 mg Give in Dialysis Mix and infuse per policy of [facility].        Allergies   Allergen Reactions   . Lisinopril Swelling     Tongue and throat swelling       Past Surgical History:  Past Surgical History:   Procedure Laterality Date   . ESOPHAGOGASTRODUODENOSCOPY     . HX BACK SURGERY     . HX CHOLECYSTECTOMY     . HX FOOT SURGERY Right    . HX HYSTERECTOMY     . HX TONSILLECTOMY             Family History:  Family Medical History:     Problem Relation (Age of Onset)    Breast Cancer Mother    Coronary Artery Disease Mother    Diabetes type II Mother    Hypertension (High Blood Pressure) Father  Social History:  Social History     Tobacco Use   . Smoking status: Every Day     Packs/day: 0.00     Types: Cigarettes   . Smokeless tobacco: Former   . Tobacco comments:     "Haven't had a  cigerette in 2 weeks."   Vaping Use   . Vaping Use: Never used   Substance Use Topics   . Alcohol use: Not Currently   . Drug use: Yes     Types: Marijuana        Review of Systems:  All systems are reviewed and are negative except those mentioned in the HPI portion    Examination:  BP (!) 163/116   Pulse (!) 109   Temp 37.1 C (98.8 F)   Resp 16   Ht 1.702 m (5\' 7" )   Wt 83 kg (182 lb 15.7 oz)   SpO2 93%   BMI 28.66 kg/m         General: Patient is alert and oriented to person, place and time.  She patient appears acutely and chronically ill.    HEENT: Pupils are of round shape, equal in size, and reactive to light bilaterally. Oral mucous membranes are moist.    Heart: S1 and S2 are present. No appreciable murmur.    Lungs: Breath sounds are appreciated at all posterior lung fields, appreciable rhonchi And Faint Wheezing.    Gastrointestinal: Bowel sounds are appreciated at all 4 quadrants. Abdomen is soft, not appreciably distended, non-tender to palpation at all quadrants.    Extremities: Radial pulses are 3/4 bilaterally, dorsalis pedis pulses are 3/4 bilaterally. Capillary refill is less than 3 seconds at distal digits bilaterally. No appreciable edema of the lower extremities.    Genitourinary: No appreciable suprapubic tenderness.    Neurologic: Follows commands appropriately. No appreciable facial droop. No appreciable focal weakness of the bilateral upper or lower extremities.    Skin: Grossly intact at observable areas.    Labs:    Lab Results Today:    Results for orders placed or performed during the hospital encounter of 08/28/21 (from the past 24 hour(s))   ECG 12 LEAD   Result Value Ref Range    Ventricular rate 111 BPM    Atrial Rate 111 BPM    PR Interval 180 ms    QRS Duration 102 ms    QT Interval 350 ms    QTC Calculation 476 ms    Calculated P Axis 78 degrees    Calculated R Axis 27 degrees    Calculated T Axis 121 degrees   COMPREHENSIVE METABOLIC PANEL, NON-FASTING   Result Value Ref  Range    SODIUM 137 136 - 145 mmol/L    POTASSIUM 4.3 3.5 - 5.1 mmol/L    CHLORIDE 99 98 - 107 mmol/L    CO2 TOTAL 26 21 - 31 mmol/L    ANION GAP 12 10 - 20 mmol/L    BUN 39 (H) 7 - 25 mg/dL    CREATININE 9.87 (H) 0.60 - 1.30 mg/dL    BUN/CREA RATIO 4 (L) 6 - 22    ESTIMATED GFR 5 (L) >59 mL/min/1.74m^2    ALBUMIN 3.9 3.5 - 5.7 g/dL    CALCIUM 9.2 8.6 - 10.3 mg/dL    GLUCOSE 86 74 - 109 mg/dL    ALKALINE PHOSPHATASE 79 34 - 104 U/L    ALT (SGPT) <7 (L) 7 - 52 U/L    AST (SGOT) 13 13 - 39  U/L    BILIRUBIN TOTAL 0.6 0.3 - 1.2 mg/dL    PROTEIN TOTAL 7.1 6.4 - 8.9 g/dL    ALBUMIN/GLOBULIN RATIO 1.2 0.8 - 1.4    OSMOLALITY, CALCULATED 283 270 - 290 mOsm/kg    CALCIUM, CORRECTED 9.3 8.9 - 10.8 mg/dL    GLOBULIN 3.2 2.9 - 5.4   TROPONIN NOW   Result Value Ref Range    TROPONIN I 176 (H) <15 ng/L   CBC WITH DIFF   Result Value Ref Range    WBCS UNCORRECTED 12.1 x10^3/uL    WBC 12.1 (H) 4.0 - 10.5 x10^3/uL    RBC 2.52 (L) 4.20 - 5.40 x10^6/uL    HGB 7.4 (L) 12.5 - 16.0 g/dL    HCT 22.6 (L) 37.0 - 47.0 %    MCV 89.7 78.0 - 99.0 fL    MCH 29.2 27.0 - 32.0 pg    MCHC 32.6 32.0 - 36.0 g/dL    RDW 18.6 (H) 11.6 - 14.8 %    PLATELETS 224 140 - 440 x10^3/uL    MPV 6.7 (L) 7.4 - 10.4 fL    NEUTROPHIL % 82 (H) 40 - 76 %    LYMPHOCYTE % 7 (L) 25 - 45 %    MONOCYTE % 7 0 - 12 %    EOSINOPHIL % 3 0 - 7 %    BASOPHIL % 1 0 - 3 %    NEUTROPHIL # 9.80 (H) 1.80 - 8.40 x10^3/uL    LYMPHOCYTE # 0.80 (L) 1.10 - 5.00 x10^3/uL    MONOCYTE # 0.90 0.00 - 1.30 x10^3/uL    EOSINOPHIL # 0.40 0.00 - 0.80 x10^3/uL    BASOPHIL # 0.20 0.00 - 0.30 x10^3/uL   TROPONIN IN ONE HOUR   Result Value Ref Range    TROPONIN I 163 (H) <15 ng/L       Imaging Studies:        Assessment/Plan:   Active Hospital Problems    Diagnosis   . Primary Problem: Hypoxia     Plan to place patient on observation.  Telemetry, continuous pulse ox, supplemental oxygen titration p.r.n. will be ordered.  Will verify patient's home medications.  Patient will have home dose of p.o.  Eliquis restarted service DVT treatment/prophylaxis.  Accu-Cheks AC and HS with sliding scale coverage will be ordered for patient.  Patient reorder renal diet.  Plan to consult Nephrology for hemodialysis.  Plan to consult Case Management to help with home O2.  Oxygen desaturation discharge study has been ordered for patient to see if she will qualify for home O2.  Will repeat labs in a.m.Marland Kitchen  See attending addendum in orders for further information.    DVT/PE Prophylaxis: Apixaban    Gardner Candle, FNP-BC    Contents of the document, in whole or in part, are completed utilizing M*Modal dictation technology, please forgive any typographical errors that may exist.

## 2021-08-29 DIAGNOSIS — J9601 Acute respiratory failure with hypoxia: Secondary | ICD-10-CM

## 2021-08-29 DIAGNOSIS — I120 Hypertensive chronic kidney disease with stage 5 chronic kidney disease or end stage renal disease: Secondary | ICD-10-CM

## 2021-08-29 DIAGNOSIS — N186 End stage renal disease: Secondary | ICD-10-CM

## 2021-08-29 LAB — CBC WITH DIFF
BASOPHIL #: 0.1 10*3/uL (ref 0.00–0.30)
BASOPHIL %: 1 % (ref 0–3)
EOSINOPHIL #: 0.4 10*3/uL (ref 0.00–0.80)
EOSINOPHIL %: 5 % (ref 0–7)
HCT: 19.1 % — CL (ref 37.0–47.0)
HGB: 6.5 g/dL — CL (ref 12.5–16.0)
LYMPHOCYTE #: 1 10*3/uL — ABNORMAL LOW (ref 1.10–5.00)
LYMPHOCYTE %: 11 % — ABNORMAL LOW (ref 25–45)
MCH: 30.1 pg (ref 27.0–32.0)
MCHC: 34.1 g/dL (ref 32.0–36.0)
MCV: 88.4 fL (ref 78.0–99.0)
MONOCYTE #: 0.8 10*3/uL (ref 0.00–1.30)
MONOCYTE %: 9 % (ref 0–12)
MPV: 7.3 fL — ABNORMAL LOW (ref 7.4–10.4)
NEUTROPHIL #: 6.8 10*3/uL (ref 1.80–8.40)
NEUTROPHIL %: 74 % (ref 40–76)
PLATELETS: 202 10*3/uL (ref 140–440)
RBC: 2.16 10*6/uL — ABNORMAL LOW (ref 4.20–5.40)
RDW: 18 % — ABNORMAL HIGH (ref 11.6–14.8)
WBC: 9.2 10*3/uL (ref 4.0–10.5)
WBCS UNCORRECTED: 9.2 10*3/uL

## 2021-08-29 LAB — RENAL FUNCTION PANEL
ALBUMIN: 3.9 g/dL (ref 3.5–5.7)
ANION GAP: 12 mmol/L (ref 10–20)
BUN: 36 mg/dL — ABNORMAL HIGH (ref 7–25)
CALCIUM, CORRECTED: 9.2 mg/dL (ref 8.9–10.8)
CALCIUM: 9.1 mg/dL (ref 8.6–10.3)
CHLORIDE: 97 mmol/L — ABNORMAL LOW (ref 98–107)
CO2 TOTAL: 28 mmol/L (ref 21–31)
CREATININE: 8.14 mg/dL — ABNORMAL HIGH (ref 0.60–1.30)
ESTIMATED GFR: 6 mL/min/{1.73_m2} — ABNORMAL LOW (ref >59)
GLUCOSE: 89 mg/dL (ref 74–109)
OSMOLALITY, CALCULATED: 282 mOsm/kg (ref 270–290)
PHOSPHORUS: 4.9 mg/dL (ref 3.7–7.2)
POTASSIUM: 4.7 mmol/L (ref 3.5–5.1)
SODIUM: 137 mmol/L (ref 136–145)

## 2021-08-29 LAB — MAGNESIUM: MAGNESIUM: 2 mg/dL (ref 1.9–2.7)

## 2021-08-29 LAB — LIGHT GREEN TOP TUBE

## 2021-08-29 LAB — LAVENDER TOP TUBE

## 2021-08-29 MED ORDER — CLONIDINE HCL 0.2 MG TABLET
0.2000 mg | ORAL_TABLET | Freq: Once | ORAL | Status: AC
Start: 2021-08-29 — End: 2021-08-29
  Administered 2021-08-29: 0.2 mg via ORAL
  Filled 2021-08-29: qty 1

## 2021-08-29 MED ORDER — ETHYL ALCOHOL 62 % (NOZIN NASAL SANITIZER) NASAL SOLUTION - BULK BOTTLE
1.0000 | Freq: Two times a day (BID) | NASAL | Status: DC
Start: 2021-08-29 — End: 2021-08-31
  Administered 2021-08-29 – 2021-08-31 (×5): 1 via NASAL

## 2021-08-29 MED ORDER — HYDRALAZINE 50 MG TABLET
50.0000 mg | ORAL_TABLET | Freq: Four times a day (QID) | ORAL | Status: DC | PRN
Start: 2021-08-29 — End: 2021-08-31
  Administered 2021-08-29 – 2021-08-30 (×3): 50 mg via ORAL
  Filled 2021-08-29 (×4): qty 1

## 2021-08-29 MED ORDER — CLONIDINE HCL 0.2 MG TABLET
0.2000 mg | ORAL_TABLET | Freq: Two times a day (BID) | ORAL | Status: DC
Start: 2021-08-29 — End: 2021-08-31
  Administered 2021-08-29 – 2021-08-30 (×3): 0 mg via ORAL
  Administered 2021-08-30 – 2021-08-31 (×2): 0.2 mg via ORAL
  Filled 2021-08-29 (×2): qty 1

## 2021-08-29 MED ORDER — SODIUM CHLORIDE 0.9 % IV BOLUS
40.0000 mL | INJECTION | Freq: Once | Status: AC | PRN
Start: 2021-08-29 — End: 2021-08-29

## 2021-08-29 MED ORDER — SODIUM CHLORIDE 0.9 % (FLUSH) INJECTION SYRINGE
10.0000 mL | INJECTION | Freq: Three times a day (TID) | INTRAMUSCULAR | Status: DC
Start: 2021-08-29 — End: 2021-08-31
  Administered 2021-08-29: 10 mL via INTRAVENOUS
  Administered 2021-08-29 – 2021-08-30 (×3): 0 mL via INTRAVENOUS
  Administered 2021-08-30: 10 mL via INTRAVENOUS
  Administered 2021-08-30: 0 mL via INTRAVENOUS
  Administered 2021-08-31: 10 mL via INTRAVENOUS

## 2021-08-29 MED ORDER — HYDROMORPHONE 2 MG/ML INJECTION WRAPPER
1.0000 mg | INJECTION | INTRAMUSCULAR | Status: AC
Start: 2021-08-30 — End: 2021-08-29
  Administered 2021-08-29: 1 mg via INTRAVENOUS
  Filled 2021-08-29: qty 1

## 2021-08-29 NOTE — Progress Notes (Addendum)
Subjective: no complaints. Saying she wants to go home.  Objective: NAD.        ROS  Constitutional- No fever, night sweats, fatigue  EYE- no blurred vision, double vision  ENT-, no difficulty swallowing, no symptoms of oral candidiasis  Lungs- no complaint of respiratory distress, no wheezing  CV- no chest pain, palpitation  GI- no nausea, vomiting, diarrhea, no abdominal pain  GU- no difficulty voiding, no flank pain  MS- no significant edema  Remainder of 10 point review of system is negative    Patient Data   Patient Vitals for the past 24 hrs:   BP Temp Pulse Resp SpO2   08/29/21 1224 (!) 165/137 36.3 C (97.3 F) 81 18 --   08/29/21 1209 (!) 156/108 36.4 C (97.5 F) 77 18 97 %   08/29/21 1200 -- -- -- -- 96 %   08/29/21 1039 -- -- 78 -- --   08/29/21 1000 -- -- -- -- 96 %   08/29/21 0800 (!) 147/100 36.3 C (97.3 F) 82 17 97 %   08/29/21 0545 (!) 164/108 36.4 C (97.6 F) 81 (!) 23 99 %   08/29/21 0404 (!) 150/110 -- -- -- --   08/29/21 0353 (!) 148/116 -- 82 -- --   08/29/21 0017 (!) 149/98 36.7 C (98.1 F) 84 15 100 %   08/28/21 2222 -- -- 86 -- --   08/28/21 2000 -- -- -- 18 98 %   08/28/21 1850 (!) 144/89 36.9 C (98.4 F) 79 15 98 %   08/28/21 1605 (!) 154/101 -- -- -- --   08/28/21 1600 (!) 146/104 36.4 C (97.5 F) 80 15 98 %     General:  Patient is resting in bed, no acute distress, alert and oriented x3   Eyes:  PERRL, no scleral icterus   HENT:  Normocephalic, atraumatic, oral mucosa is moist and pink, no nasal discharge   Heart:  RRR, S1 and S2 auscultated, no murmurs appreciated   Lungs:  Unlabored respirations.  Lungs are clear to auscultation bilaterally, no wheezes, no rales  Abdomen:  Soft, active bowel sounds, non-tender to palpation, non-distended  Extremities:  Pulses equal in all extremities bilaterally.  Capillary refill less than 3 seconds.  No edema in lower extremities bilaterally   Skin:  Warm and dry.  Not diaphoretic  Neuro:  A&O x 3.  No focal deficits.  Speech intact.  Not  tremulous  Psych:  Cooperative, not agitated    Intake/Output Summary (Last 24 hours) at 08/29/2021 1316  Last data filed at 08/29/2021 0400  Gross per 24 hour   Intake 480 ml   Output --   Net 480 ml     Current Facility-Administered Medications   Medication Dose Route Frequency Provider Last Rate Last Admin   . acetaminophen (TYLENOL) tablet  650 mg Oral Q4H PRN Gardner Candle, FNP-BC       . alcohol 62 % (NOZIN NASAL SANITIZER) nasal solution  1 Each Each Nostril 2x/day Julian Hy, MD   1 Each at 08/29/21 [phone]   . apixaban (ELIQUIS) tablet  5 mg Oral 2x/day Gardner Candle, FNP-BC   5 mg at 08/29/21 0847   . calcitriol (ROCALTROL) capsule  1 mcg Oral M, W, and F Gardner Candle, FNP-BC   1 mcg at 08/28/21 2947   . cinacalcet (SENSIPAR) tablet  30 mg Oral QPM Gardner Candle, FNP-BC   30 mg at 08/28/21 2027   . cloNIDine (CATAPRES) tablet  0.2 mg Oral 2x/day Richardean Sale, MD       . Derrill Memo ON 08/30/2021] cloNIDine (CATAPRES-TTS) transdermal patch 0.3 mg  0.3 mg Transdermal Q7 Days Gardner Candle, FNP-BC       . clopidogrel (PLAVIX) 75 mg tablet  75 mg Oral Daily Gardner Candle, FNP-BC   75 mg at 08/29/21 0847   . cyclobenzaprine (FLEXERIL) tablet  5 mg Oral 2x/day PRN Gardner Candle, FNP-BC       . dextrose 50% (0.5 g/mL) injection - syringe  25 g Intravenous Q15 Min PRN Gardner Candle, FNP-BC        Or   . dextrose 50% (0.5 g/mL) injection - syringe  12.5 g Intravenous Q15 Min PRN Gardner Candle, FNP-BC        Or   . glucagon (GLUCAGEN DIAGNOSTIC KIT) injection 1 mg  1 mg Subcutaneous Q15 Min PRN Gardner Candle, FNP-BC        Or   . glucagon (GLUCAGEN DIAGNOSTIC KIT) injection 1 mg  1 mg IntraMUSCULAR Q15 Min PRN Gardner Candle, FNP-BC       . dilTIAZem (CARDIZEM CD) 24 hr extended release capsule  180 mg Oral Daily Gardner Candle, FNP-BC   180 mg at 08/29/21 0847   . doxazosin (CARDURA) tablet  4 mg Oral QPM Gardner Candle, FNP-BC   4 mg at 08/28/21 2027   . furosemide (LASIX) tablet  160 mg Oral Daily Gardner Candle, FNP-BC   160 mg at 08/29/21 0847   . guaiFENesin [facility]) extended release tablet - for cough (expectorant)  600 mg Oral Q12H Gardner Candle, FNP-BC   600 mg at 08/29/21 7614   . hydrALAZINE (APRESOLINE) tablet  50 mg Oral Q6H PRN Julian Hy, MD       . isosorbide mononitrate (IMDUR) 24 hr extended release tablet  120 mg Oral QAM Gardner Candle, FNP-BC   120 mg at 08/29/21 7092   . labetalol (NORMODYNE) tablet  300 mg Oral Q12H Gardner Candle, FNP-BC   300 mg at 08/29/21 9574   . loratadine (CLARITIN) tablet  10 mg Oral Daily Gardner Candle, FNP-BC   10 mg at 08/29/21 0848   . montelukast (SINGULAIR) 10 mg tablet  10 mg Oral QPM Gardner Candle, FNP-BC   10 mg at 08/28/21 2027   . morphine 2 mg/mL injection  2 mg Intravenous Q4H PRN Julian Hy, MD       . NS bolus infusion 40 mL  40 mL Intravenous Once PRN Julian Hy, MD       . NS flush syringe  10 mL Intravenous Q8H Julian Hy, MD       . ondansetron [facility]) tablet  4 mg Oral Q12H PRN Gardner Candle, FNP-BC       . pantoprazole ([place]) delayed release tablet  40 mg Oral Daily Gardner Candle, FNP-BC   40 mg at 08/29/21 0847   . SSIP insulin R human (HUMULIN R) 100 units/mL injection  0-12 Units Subcutaneous 4x/day AC Julian Hy, MD       . Vancomycin IV - Pharmacist to Dose per Protocol   Does not apply Daily PRN Julian Hy, MD         Active Hospital Problems    Diagnosis   . Primary Problem: Hypoxia   . Anemia in chronic kidney disease   . LVH (left ventricular hypertrophy)   . ESRD (end stage renal disease) on dialysis (CMS HCC)   . Secondary hyperparathyroidism of renal  origin (CMS [facility])     Lab Review:  I have reviewed all lab results.    Image Review:  I have reviewed all available imaging results.       Assessment and Plan:    1. ESRD: continue HD as scheduled.  2. HTN: wll need adjustment to meds. PRN hydralazine.  3. Chronic  anemia: HGB has trended down. Transfuse 2 units of PRBCs.  4. Acute respiratory failure with hypoxia: from fluid overload due to renal failure. Wean off O2 as tolerated.  5. Disposition: hopefully DC next 24 hours.    Julian Hy, MD

## 2021-08-29 NOTE — Nurses Notes (Signed)
Informed Christain [place], FNP of BP 150/110. Orders to give morning dose of Imdur and labetalol early.

## 2021-08-30 DIAGNOSIS — I1311 Hypertensive heart and chronic kidney disease without heart failure, with stage 5 chronic kidney disease, or end stage renal disease: Secondary | ICD-10-CM

## 2021-08-30 DIAGNOSIS — R042 Hemoptysis: Secondary | ICD-10-CM

## 2021-08-30 LAB — CBC
HCT: 24.8 % — ABNORMAL LOW (ref 37.0–47.0)
HGB: 8.5 g/dL — ABNORMAL LOW (ref 12.5–16.0)
MCH: 29.5 pg (ref 27.0–32.0)
MCHC: 34.1 g/dL (ref 32.0–36.0)
MCV: 86.3 fL (ref 78.0–99.0)
MPV: 7.1 fL — ABNORMAL LOW (ref 7.4–10.4)
PLATELETS: 186 10*3/uL (ref 140–440)
RBC: 2.88 10*6/uL — ABNORMAL LOW (ref 4.20–5.40)
RDW: 18.7 % — ABNORMAL HIGH (ref 11.6–14.8)
WBC: 8.6 10*3/uL (ref 4.0–10.5)
WBCS UNCORRECTED: 8.6 10*3/uL

## 2021-08-30 LAB — RENAL FUNCTION PANEL
ALBUMIN: 4 g/dL (ref 3.5–5.7)
ANION GAP: 13 mmol/L (ref 10–20)
BUN: 45 mg/dL — ABNORMAL HIGH (ref 7–25)
CALCIUM, CORRECTED: 9.2 mg/dL (ref 8.9–10.8)
CALCIUM: 9.2 mg/dL (ref 8.6–10.3)
CHLORIDE: 96 mmol/L — ABNORMAL LOW (ref 98–107)
CO2 TOTAL: 27 mmol/L (ref 21–31)
CREATININE: 9.73 mg/dL — ABNORMAL HIGH (ref 0.60–1.30)
ESTIMATED GFR: 5 mL/min/{1.73_m2} — ABNORMAL LOW (ref >59)
GLUCOSE: 90 mg/dL (ref 74–109)
OSMOLALITY, CALCULATED: 283 mOsm/kg (ref 270–290)
PHOSPHORUS: 5.1 mg/dL (ref 3.7–7.2)
POTASSIUM: 4 mmol/L (ref 3.5–5.1)
SODIUM: 136 mmol/L (ref 136–145)

## 2021-08-30 LAB — BPAM PACKED CELL ORDER
UNIT DIVISION: 0
UNIT DIVISION: 0

## 2021-08-30 LAB — TYPE AND CROSS RED CELLS - UNITS
ABO/RH(D): O POS
ANTIBODY SCREEN: NEGATIVE
UNITS ORDERED: 2

## 2021-08-30 LAB — VANCOMYCIN, TROUGH: VANCOMYCIN TROUGH: 20.1 ug/mL (ref 5.0–10.0)

## 2021-08-30 MED ORDER — SODIUM CHLORIDE 0.9 % INTRAVENOUS PIGGYBACK
100.0000 mg | Freq: Two times a day (BID) | INTRAVENOUS | Status: DC
Start: 2021-08-30 — End: 2021-08-31
  Administered 2021-08-30: 100 mg via INTRAVENOUS
  Administered 2021-08-30 – 2021-08-31 (×3): 0 mg via INTRAVENOUS
  Administered 2021-08-31 (×2): 100 mg via INTRAVENOUS
  Filled 2021-08-30 (×3): qty 10

## 2021-08-30 MED ORDER — LACTULOSE 10 GRAM/15 ML (15 ML) ORAL SOLUTION
30.0000 mL | Freq: Every day | ORAL | Status: DC
Start: 2021-08-30 — End: 2021-08-31
  Administered 2021-08-30 – 2021-08-31 (×2): 30 mL via ORAL
  Filled 2021-08-30 (×2): qty 30

## 2021-08-30 MED ORDER — VANCOMYCIN 10 GRAM INTRAVENOUS SOLUTION
20.0000 mg/kg | INTRAVENOUS | Status: DC
Start: 2021-08-30 — End: 2021-08-31
  Filled 2021-08-30: qty 15

## 2021-08-30 NOTE — Nurses Notes (Signed)
Patient refused tap water enema

## 2021-08-30 NOTE — Progress Notes (Signed)
Subjective: patient is complaining of blood in sputum.  Objective:  Vital signs: (most recent): Blood pressure (!) 177/116, pulse 79, temperature 36.4 C (97.6 F), resp. rate 18, height 1.702 m (5' 7.01"), weight 83 kg (182 lb 15.7 oz), SpO2 97 %.    : NAD.        ROS  Constitutional- No fever, night sweats, fatigue  EYE- no blurred vision, double vision  ENT-, no difficulty swallowing, no symptoms of oral candidiasis  Lungs- no complaint of respiratory distress, no wheezing  CV- no chest pain, palpitation  GI- no nausea, vomiting, diarrhea, no abdominal pain  GU- no difficulty voiding, no flank pain  MS- no significant edema  Remainder of 10 point review of system is negative    Patient Data   Patient Vitals for the past 24 hrs:   BP Temp Pulse Resp SpO2   08/30/21 1335 (!) 177/116 -- 79 18 97 %   08/30/21 1109 -- -- 71 -- --   08/30/21 0701 (!) 150/103 36.4 C (97.6 F) 67 -- 96 %   08/30/21 0610 (!) 170/103 -- 69 17 96 %   08/30/21 0359 (!) 154/107 36.6 C (97.9 F) 68 16 94 %   08/30/21 0259 (!) 161/106 -- 67 -- --   08/30/21 0043 (!) 163/102 36.9 C (98.4 F) 71 18 94 %   08/29/21 2207 (!) 166/109 36.5 C (97.7 F) 75 18 96 %   08/29/21 2149 -- -- 75 -- --   08/29/21 1954 (!) 145/91 36.6 C (97.9 F) 74 14 96 %   08/29/21 1800 (!) 137/117 -- -- -- 97 %   08/29/21 1748 (!) 158/98 36.3 C (97.4 F) 70 18 98 %   08/29/21 1721 (!) 152/105 36.2 C (97.1 F) 65 17 99 %   08/29/21 1700 (!) 156/112 -- -- 18 --   08/29/21 1600 (!) 144/105 36.4 C (97.5 F) 77 18 96 %   08/29/21 1500 (!) 136/107 36.2 C (97.2 F) 65 16 96 %   08/29/21 1400 (!) 159/110 36.1 C (97 F) 70 15 95 %     General:  Patient is resting in bed, no acute distress, alert and oriented x3   Eyes:  PERRL, no scleral icterus   HENT:  Normocephalic, atraumatic, oral mucosa is moist and pink, no nasal discharge   Heart:  RRR, S1 and S2 auscultated, no murmurs appreciated   Lungs:  Unlabored respirations.  Lungs are clear to auscultation bilaterally, no  wheezes, no rales  Abdomen:  Soft, active bowel sounds, non-tender to palpation, non-distended  Extremities:  Pulses equal in all extremities bilaterally.  Capillary refill less than 3 seconds.  No edema in lower extremities bilaterally   Skin:  Warm and dry.  Not diaphoretic  Neuro:  A&O x 3.  No focal deficits.  Speech intact.  Not tremulous  Psych:  Cooperative, not agitated    Intake/Output Summary (Last 24 hours) at 08/30/2021 1339  Last data filed at 08/30/2021 0800  Gross per 24 hour   Intake 917.33 ml   Output --   Net 917.33 ml     Current Facility-Administered Medications   Medication Dose Route Frequency Provider Last Rate Last Admin   . acetaminophen (TYLENOL) tablet  650 mg Oral Q4H PRN Gardner Candle, FNP-BC       . alcohol 62 % (NOZIN NASAL SANITIZER) nasal solution  1 Each Each Nostril 2x/day Julian Hy, MD   1 Each at 08/30/21 0830   .  apixaban (ELIQUIS) tablet  5 mg Oral 2x/day Gardner Candle, FNP-BC   5 mg at 08/29/21 2223   . calcitriol (ROCALTROL) capsule  1 mcg Oral M, W, and F Gardner Candle, FNP-BC   1 mcg at 08/30/21 1337   . cinacalcet (SENSIPAR) tablet  30 mg Oral QPM Gardner Candle, FNP-BC   30 mg at 08/29/21 2223   . cloNIDine (CATAPRES) tablet  0.2 mg Oral 2x/day Richardean Sale, MD       . cloNIDine (CATAPRES-TTS) transdermal patch 0.3 mg  0.3 mg Transdermal Q7 Days Gardner Candle, FNP-BC   0.2 mg at 08/30/21 0830   . clopidogrel (PLAVIX) 75 mg tablet  75 mg Oral Daily Gardner Candle, FNP-BC   75 mg at 08/29/21 0847   . cyclobenzaprine (FLEXERIL) tablet  5 mg Oral 2x/day PRN Gardner Candle, FNP-BC   5 mg at 08/29/21 2041   . dextrose 50% (0.5 g/mL) injection - syringe  25 g Intravenous Q15 Min PRN Gardner Candle, FNP-BC        Or   . dextrose 50% (0.5 g/mL) injection - syringe  12.5 g Intravenous Q15 Min PRN Gardner Candle, FNP-BC        Or   . glucagon (GLUCAGEN DIAGNOSTIC KIT) injection 1 mg  1 mg Subcutaneous Q15 Min PRN Gardner Candle, FNP-BC        Or   . glucagon  (GLUCAGEN DIAGNOSTIC KIT) injection 1 mg  1 mg IntraMUSCULAR Q15 Min PRN Gardner Candle, FNP-BC       . dilTIAZem (CARDIZEM CD) 24 hr extended release capsule  180 mg Oral Daily Gardner Candle, FNP-BC   180 mg at 08/30/21 1337   . doxazosin (CARDURA) tablet  4 mg Oral QPM Gardner Candle, FNP-BC   4 mg at 08/29/21 2223   . doxycycline hyclate 100 mg in NS 100 mL IVPB minibag  100 mg Intravenous Q12H Unk Lightning, MD 100 mL/hr at 08/30/21 1336 100 mg at 08/30/21 1336   . furosemide (LASIX) tablet  160 mg Oral Daily Gardner Candle, FNP-BC   160 mg at 08/30/21 1337   . guaiFENesin [facility]) extended release tablet - for cough (expectorant)  600 mg Oral Q12H Gardner Candle, FNP-BC   600 mg at 08/30/21 1638   . hydrALAZINE (APRESOLINE) tablet  50 mg Oral Q6H PRN Julian Hy, MD   50 mg at 08/30/21 0403   . isosorbide mononitrate (IMDUR) 24 hr extended release tablet  120 mg Oral QAM Gardner Candle, FNP-BC   120 mg at 08/30/21 4536   . labetalol (NORMODYNE) tablet  300 mg Oral Q12H Gardner Candle, FNP-BC   300 mg at 08/30/21 4680   . loratadine (CLARITIN) tablet  10 mg Oral Daily Gardner Candle, FNP-BC   10 mg at 08/30/21 1337   . montelukast (SINGULAIR) 10 mg tablet  10 mg Oral QPM Gardner Candle, FNP-BC   10 mg at 08/29/21 2223   . morphine 2 mg/mL injection  2 mg Intravenous Q4H PRN Julian Hy, MD       . NS flush syringe  10 mL Intravenous Q8H Julian Hy, MD   10 mL at 08/30/21 [phone]   . ondansetron (ZOFRAN) tablet  4 mg Oral Q12H PRN Gardner Candle, FNP-BC       . pantoprazole (PROTONIX) delayed release tablet  40 mg Oral Daily Gardner Candle, FNP-BC   40 mg at 08/30/21 1337   . SSIP insulin R human (HUMULIN R) 100 units/mL injection  0-12 Units  Subcutaneous 4x/day AC Julian Hy, MD       . vancomycin (VANCOCIN) 1,500 mg in NS 500 mL IVPB  20 mg/kg (Adjusted) Intravenous Give in Dialysis Julian Hy, MD       . Vancomycin IV -  Pharmacist to Dose per Protocol   Does not apply Daily PRN Julian Hy, MD         Active Hospital Problems    Diagnosis   . Primary Problem: Acute respiratory failure with hypoxia (CMS HCC)   . Hemoptysis   . Anemia in chronic kidney disease   . LVH (left ventricular hypertrophy)   . ESRD (end stage renal disease) on dialysis (CMS HCC)   . Uncontrolled hypertension   . Secondary hyperparathyroidism of renal origin (CMS [facility])     Lab Review:  I have reviewed all lab results.    Image Review:  I have reviewed all available imaging results.       Assessment and Plan:    1. ESRD: continue HD as scheduled.  2. HTN: wll need adjustment to meds. PRN hydralazine.  3. Chronic anemia: HGB has trended down. Transfuse 2 units of PRBCs.  4. Acute respiratory failure with hypoxia: from fluid overload due to renal failure. Wean off O2 as tolerated.  5. Hemoptysis: seen by pulmonologist who suspects bronchitis. Started on abx. Pulmonary care.  6. Disposition: hopefully DC next 24 hours.    Julian Hy, MD

## 2021-08-30 NOTE — Care Plan (Signed)
Pt states, "every time I come to this hospital they never give me the medicines that I need that's why my blood pressure is high. I've been telling everybody that I am hurting in my chest and Morphine doesn't work and nobody does anything. I've never seen or talked to a doctor since I've been here."  Problem: Adult Inpatient Plan of Care  Goal: Plan of Care Review  Outcome: Ongoing (see interventions/notes)     Problem: Adult Inpatient Plan of Care  Goal: Patient-Specific Goal (Individualized)  Outcome: Ongoing (see interventions/notes)

## 2021-08-30 NOTE — Care Plan (Signed)
Problem: Adult Inpatient Plan of Care  Goal: Plan of Care Review  Outcome: Ongoing (see interventions/notes)  Goal: Patient-Specific Goal (Individualized)  Outcome: Ongoing (see interventions/notes)  Flowsheets (Taken 08/30/2021 0816)  Patient would like to participate in bedside shift report: Yes  Individualized Care Needs:   Dialysis M W F   O2 therapy  Anxieties, Fears or Concerns: Patient denies anxiety. does report wanting to go home  Patient-Specific Goals (Include Timeframe): Patient would like to be able to be discharged  Plan of Care Reviewed With: patient  Goal: Absence of Hospital-Acquired Illness or Injury  Outcome: Ongoing (see interventions/notes)  Intervention: Identify and Manage Fall Risk  Recent Flowsheet Documentation  Taken 08/30/2021 1400 by Sarina Ser, RN  Safety Promotion/Fall Prevention:   activity supervised   toileting scheduled   safety round/check completed   nonskid shoes/slippers when out of bed  Taken 08/30/2021 0816 by Sarina Ser, RN  Safety Promotion/Fall Prevention:   activity supervised   toileting scheduled   safety round/check completed   nonskid shoes/slippers when out of bed  Intervention: Prevent and Manage VTE (Venous Thromboembolism) Risk  Recent Flowsheet Documentation  Taken 08/30/2021 0816 by Sarina Ser, RN  VTE Prevention/Management: patient refused intervention  Goal: Optimal Comfort and Wellbeing  Outcome: Ongoing (see interventions/notes)  Intervention: Provide Person-Centered Care  Recent Flowsheet Documentation  Taken 08/30/2021 0816 by Sarina Ser, [place] Relationship/Rapport: care explained  Goal: Rounds/Family Conference  Outcome: Ongoing (see interventions/notes)     Problem: Skin Injury Risk Increased  Goal: Skin Health and Integrity  Outcome: Ongoing (see interventions/notes)     Problem: Chronic Kidney Disease  Goal: Optimal Coping with Chronic Illness  Outcome: Ongoing (see interventions/notes)  Intervention: Support Psychosocial Response  Recent Flowsheet  Documentation  Taken 08/30/2021 0816 by Sarina Ser, RN  Family/Support System Care:   self-care encouraged   support provided  Goal: Electrolyte Balance  Outcome: Ongoing (see interventions/notes)  Goal: Fluid Balance  Outcome: Ongoing (see interventions/notes)  Goal: Optimal Functional Ability  Outcome: Ongoing (see interventions/notes)  Goal: Absence of Anemia Signs and Symptoms  Outcome: Ongoing (see interventions/notes)  Goal: Optimal Oral Intake  Outcome: Ongoing (see interventions/notes)  Goal: Acceptable Pain Control  Outcome: Ongoing (see interventions/notes)  Goal: Minimize Renal Failure Effects  Outcome: Ongoing (see interventions/notes)     Problem: Gas Exchange Impaired  Goal: Optimal Gas Exchange  Outcome: Ongoing (see interventions/notes)    Patient had dialysis today. 3L removed. Patient continues to have elevated BP. Received x1 PRN BP med. Patient started on IV doxycycline. Patient denies CP or SOB. Discharge planning is ongoing.

## 2021-08-30 NOTE — Consults (Signed)
[name]        PULMONARY MEDICINE CONSULTATION NOTE    [name], [name], 46 y.o. female  Date of Birth:  1975-05-14  Encounter Start Date:  08/28/2021  Inpatient Admission Date:   Date of service: 08/30/2021    Service: Pulmonary Medicine  Requesting MD:  Dr. Ronnald Ramp    Reason for consultation:  Hemoptysis    HPI:  [name] is a 46 y.o. female chronic smoker continued to smoke does have end-stage renal disease dialysis dependent currently getting dialysis at episode of hemoptysis, she was diagnosed to have pulmonary emboli and DVT currently on Eliquis, started having hemoptysis today does not any fever chills chest pain denies any nausea vomiting   , does have generalized weakness, recently had a CT scan of the chest done did not show any evidence of pulmonary emboli did show evidence of bibasilar atelectasis.    Historical Data   Past Medical History:   Diagnosis Date   . Asthma    . Chronic diastolic CHF (congestive heart failure) (CMS HCC)    . Esophageal reflux    . ESRD (end stage renal disease) (CMS [place])    . History of anemia due to CKD    . Hypertension    . MI (myocardial infarction) (CMS [place])    . Mitral valve regurgitation    . Pulmonary edema    . Sleep apnea    . Tricuspid valve regurgitation      Past Surgical History:   Procedure Laterality Date   . ESOPHAGOGASTRODUODENOSCOPY     . HX BACK SURGERY     . HX CHOLECYSTECTOMY     . HX FOOT SURGERY Right    . HX HYSTERECTOMY     . HX TONSILLECTOMY           Allergies   Allergen Reactions   . Lisinopril Swelling     Tongue and throat swelling     Family History  Family Medical History:     Problem Relation (Age of Onset)    Breast Cancer Mother    Coronary Artery Disease Mother    Diabetes type II Mother    Hypertension (High Blood Pressure) Father         Social History  Social History     Tobacco Use   . Smoking status: Every Day     Packs/day: 1.00     Years: 10.00     Pack years: 10.00     Types: Cigarettes   .  Smokeless tobacco: Former   . Tobacco comments:     "Haven't had a cigerette in 2 weeks."   Vaping Use   . Vaping Use: Never used   Substance Use Topics   . Alcohol use: Not Currently   . Drug use: Yes     Types: Marijuana            Medications Prior to Admission     Prescriptions    acetaminophen (TYLENOL) 325 mg Oral Tablet    Take 2 Tablets (650 mg total) by mouth Every 4 hours as needed    albuterol sulfate (PROVENTIL OR VENTOLIN OR PROAIR) 90 mcg/actuation Inhalation oral inhaler    Take 1-2 Puffs by inhalation Every 6 hours as needed    apixaban (ELIQUIS) 5 mg Oral Tablet    Take 1 Tablet (5 mg total) by mouth Twice daily for 30 days    budesonide-formoteroL (SYMBICORT) 160-4.5 mcg/actuation Inhalation oral inhaler  Take 2 Puffs by inhalation Twice daily    calcitrioL (ROCALTROL) 0.5 mcg Oral Capsule    Take 2 Capsules (1 mcg total) by mouth Every Monday, Wednesday and Friday    cinacalcet (SENSIPAR) 30 mg Oral Tablet    Take 1 Tablet (30 mg total) by mouth Every evening    cloNIDine (CATAPRES-TTS) 0.3 mg/24 hr Transdermal Patch Weekly    Place 1 Patch (0.3 mg total) on the skin Every 7 days    clopidogreL (PLAVIX) 75 mg Oral Tablet    Take 1 Tablet (75 mg total) by mouth Once a day    cyclobenzaprine (FLEXERIL) 5 mg Oral Tablet    Take 1 Tablet (5 mg total) by mouth Twice per day as needed for Muscle spasms    dilTIAZem (CARDIZEM CD) 180 mg Oral Capsule, Sust. Release 24 hr    Take 1 Capsule (180 mg total) by mouth Once a day for 30 days    doxazosin (CARDURA) 4 mg Oral Tablet    Take 1 Tablet (4 mg total) by mouth Every evening    ferric citrate (AURYXIA ORAL)    Take 1 g by mouth Three times daily before meals "Take 2 tablets three times daily before meals and 1 tablet before snacks"    furosemide (LASIX) 80 mg Oral Tablet    Take 2 Tablets (160 mg total) by mouth Once a day Take after dialysis on dialysis days; otherwise take in morning daily.    guaiFENesin (MUCINEX) 600 mg Oral Tablet Extended Release  12hr    Take 1 Tablet (600 mg total) by mouth Every 12 hours    isosorbide mononitrate (IMDUR) 120 mg Oral Tablet Sustained Release 24 hr    Take 1 Tablet (120 mg total) by mouth Every morning for 30 days    labetaloL (NORMODYNE) 300 mg Oral Tablet    Take 1 Tablet (300 mg total) by mouth Every 12 hours for 30 days    loratadine (CLARITIN) 10 mg Oral Tablet    Take 1 Tablet (10 mg total) by mouth Once a day    montelukast (SINGULAIR) 10 mg Oral Tablet    Take 1 Tablet (10 mg total) by mouth Every evening    nitroGLYCERIN (NITROSTAT) 0.4 mg Sublingual Tablet, Sublingual    Place 1 Tablet (0.4 mg total) under the tongue Every 5 minutes for 3 doses for 3 doses over 15 minutes    ondansetron (ZOFRAN) 4 mg Oral Tablet    Take 1 Tablet (4 mg total) by mouth Every 12 hours as needed for Nausea/Vomiting    pantoprazole (PROTONIX) 40 mg Oral Tablet, Delayed Release (E.C.)    Take 1 Tablet (40 mg total) by mouth Once a day    vancomycin 1,500 mg in NS 15 mL infusion    1,500 mg Give in Dialysis Mix and infuse per policy of [facility].        acetaminophen (TYLENOL) tablet, 650 mg, Oral, Q4H PRN  alcohol 62 % (NOZIN NASAL SANITIZER) nasal solution, 1 Each, Each Nostril, 2x/day  apixaban (ELIQUIS) tablet, 5 mg, Oral, 2x/day  calcitriol (ROCALTROL) capsule, 1 mcg, Oral, M, W, and F  cinacalcet (SENSIPAR) tablet, 30 mg, Oral, QPM  cloNIDine (CATAPRES) tablet, 0.2 mg, Oral, 2x/day  cloNIDine (CATAPRES-TTS) transdermal patch 0.3 mg, 0.3 mg, Transdermal, Q7 Days  clopidogrel (PLAVIX) 75 mg tablet, 75 mg, Oral, Daily  cyclobenzaprine (FLEXERIL) tablet, 5 mg, Oral, 2x/day PRN  dextrose 50% (0.5 g/mL) injection - syringe, 25 g, Intravenous,  Q15 Min PRN   Or  dextrose 50% (0.5 g/mL) injection - syringe, 12.5 g, Intravenous, Q15 Min PRN   Or  glucagon (GLUCAGEN DIAGNOSTIC KIT) injection 1 mg, 1 mg, Subcutaneous, Q15 Min PRN   Or  glucagon (GLUCAGEN DIAGNOSTIC KIT) injection 1 mg, 1 mg, IntraMUSCULAR, Q15 Min PRN  dilTIAZem  (CARDIZEM CD) 24 hr extended release capsule, 180 mg, Oral, Daily  doxazosin (CARDURA) tablet, 4 mg, Oral, QPM  doxycycline hyclate 100 mg in NS 100 mL IVPB minibag, 100 mg, Intravenous, Q12H  furosemide (LASIX) tablet, 160 mg, Oral, Daily  guaiFENesin (MUCINEX) extended release tablet - for cough (expectorant), 600 mg, Oral, Q12H  hydrALAZINE (APRESOLINE) tablet, 50 mg, Oral, Q6H PRN  isosorbide mononitrate (IMDUR) 24 hr extended release tablet, 120 mg, Oral, QAM  labetalol (NORMODYNE) tablet, 300 mg, Oral, Q12H  loratadine (CLARITIN) tablet, 10 mg, Oral, Daily  montelukast (SINGULAIR) 10 mg tablet, 10 mg, Oral, QPM  morphine 2 mg/mL injection, 2 mg, Intravenous, Q4H PRN  NS flush syringe, 10 mL, Intravenous, Q8H  ondansetron (ZOFRAN) tablet, 4 mg, Oral, Q12H PRN  pantoprazole (PROTONIX) delayed release tablet, 40 mg, Oral, Daily  SSIP insulin R human (HUMULIN R) 100 units/mL injection, 0-12 Units, Subcutaneous, 4x/day AC  vancomycin (VANCOCIN) 1,500 mg in NS 500 mL IVPB, 20 mg/kg (Adjusted), Intravenous, Give in Dialysis  Vancomycin IV - Pharmacist to Dose per Protocol, , Does not apply, Daily PRN      Active Orders   Imaging    CT CHEST WO IV CONTRAST     Frequency: ONE TIME     Number of Occurrences: 1 Occurrences     Scheduling Instructions:               Lab    VANCOMYCIN, RANDOM     Frequency: ONE TIME     Number of Occurrences: 1 Occurrences   Diet    DIET RENAL Do you want to initiate MNT Protocol? Yes; Restrict fluids to: 1200 ML     Frequency: All Meals     Number of Occurrences: 1 Occurrences   Nursing    ACTIVITY Activity: AS TOLERATED; Instructions: WITH ASSIST; Weight bearing limits: NO WEIGHT-BEARING LIMITATIONS     Frequency: UNTIL DISCONTINUED     Number of Occurrences: Until Specified    APIXABAN NURSING ORDER     Frequency: UNTIL DISCONTINUED     Number of Occurrences: Until Specified     Order Comments: Nursing Instructions:    1.  Print apixaban (Eliquis) guide using the link in this order.    2.  Nurse to provide apixaban patient guide to all patients and be sure that all new starts have received education by the trained anticoagulation educator.  If additional questions, contact pharmacy.           APPLY SEQUENTIAL COMPRESSION DEVICE     Frequency: ONE TIME     Number of Occurrences: 1 Occurrences    INTAKE AND OUTPUT QSHIFT     Frequency: QSHIFT     Number of Occurrences: Until Specified    MAINTAIN SEQUENTIAL COMPRESSION DEVICE     Frequency: CONTINUOUS X 72 HRS     Number of Occurrences: 87 Hours    NOTIFY MD     Frequency: PRN     Number of Occurrences: Until Specified    Notify MD Vital Signs     Frequency: PRN     Number of Occurrences: Until Specified    OXYGEN CHALLENGE FOR DISCHARGE -  NURSING TO PERFORM     Frequency: ONE TIME     Number of Occurrences: 1 Occurrences     Order Comments: If greater than 4LPM of Oxygen is prescribed, patient must have a resting pulse ox on 4LPM documented.    Please perform the following steps and record values (single numbers - not ranges) in the FLOWSHEET under ADULT PCS BODY SYS tab under RESPIRATORY in group labeled  Home Oxygen Assessment.    Oxygen saturation on room air at rest - If <89% no further testing required   Oxygen saturation on room air with ambulation - If >89% no further testing required.   Oxygen amount applied (LPM)   Oxygen saturation on oxygen with ambulation  This cannot be performed more than 48 hours in advance of the patient's discharge.    Please contact Care Manager with questions.         PT IS HIGH RISK FOR VENOUS THROMBOEMBOLISM     Frequency: CONTINUOUS     Number of Occurrences: Until Specified    PULSE OXIMETRY Q4H     Frequency: Q4H     Number of Occurrences: Until Specified    TELEMETRY MONITORING - Continuous     Frequency: CONTINUOUS     Number of Occurrences: Until Specified    VITAL SIGNS  Q4H     Frequency: Q4H     Number of Occurrences: Until Specified    WAS PATIENT ON APIXABAN PRIOR TO ADMISSION?     Frequency: UNTIL  DISCONTINUED     Number of Occurrences: Until Specified    WEIGH PATIENT PRE AND POST DIALYSIS TREATMENT     Frequency: EVERY OTHER DAY     Number of Occurrences: Until Specified   Code Status    FULL CODE     Frequency: CONTINUOUS     Number of Occurrences: Until Specified   Consult    IP CONSULT TO CARE MANAGEMENT     Frequency: ONE TIME     Number of Occurrences: 1 Occurrences     Order Comments: Help with Home O2      IP CONSULT TO NEPHROLOGY On-Call Provider (nurse/clerk to determine)     Frequency: ONE TIME     Number of Occurrences: 1 Occurrences    IP CONSULT TO PALLIATIVE CARE     Frequency: ONE TIME     Number of Occurrences: 1 Occurrences    IP CONSULT TO PULMONOLOGY Requested Provider; Posey Pronto, VISHNU     Frequency: ONE TIME     Number of Occurrences: 1 Occurrences   Respiratory Care    OXYGEN - NASAL CANNULA     Frequency: CONTINUOUS     Number of Occurrences: Until Specified     Order Comments: Flowrate should not exeed 6L/min except [facility] facility.          Point of Care Testing    PERFORM POC WHOLE BLOOD GLUCOSE     Frequency: TID AC & HS     Number of Occurrences: Until Specified   Blood Bank    CROSSMATCH RED CELLS - UNITS , 2 Units     Frequency: Once     Number of Occurrences: 1 Occurrences   Dialysis    HEMODIALYSIS     Frequency: Q Mon/Wed/Fri (0800)     Number of Occurrences: Until Specified   Medications    acetaminophen (TYLENOL) tablet     Frequency: Q4H PRN     Dose: 650 mg     Route:  Oral    alcohol 62 % (NOZIN NASAL SANITIZER) nasal solution     Frequency: 2x/day     Dose: 1 Each     Route: Each Nostril    apixaban (ELIQUIS) tablet     Frequency: 2x/day     Dose: 5 mg     Route: Oral    calcitriol (ROCALTROL) capsule     Frequency: M, W, and F     Dose: 1 mcg     Route: Oral    cinacalcet (SENSIPAR) tablet     Frequency: QPM     Dose: 30 mg     Route: Oral    cloNIDine (CATAPRES) tablet     Frequency: 2x/day     Dose: 0.2 mg     Route: Oral    cloNIDine (CATAPRES-TTS) transdermal patch 0.3  mg     Frequency: Q7 Days     Dose: 0.3 mg     Route: Transdermal    clopidogrel (PLAVIX) 75 mg tablet     Frequency: Daily     Dose: 75 mg     Route: Oral    cyclobenzaprine (FLEXERIL) tablet     Frequency: 2x/day PRN     Dose: 5 mg     Route: Oral    dextrose 50% (0.5 g/mL) injection - syringe     Linked Order: Or     Frequency: Q15 Min PRN     Dose: 25 g     Route: Intravenous    dextrose 50% (0.5 g/mL) injection - syringe     Linked Order: Or     Frequency: Q15 Min PRN     Dose: 12.5 g     Route: Intravenous    dilTIAZem (CARDIZEM CD) 24 hr extended release capsule     Frequency: Daily     Dose: 180 mg     Route: Oral    doxazosin (CARDURA) tablet     Frequency: QPM     Dose: 4 mg     Route: Oral    doxycycline hyclate 100 mg in NS 100 mL IVPB minibag     Frequency: Q12H     Dose: 100 mg     Route: Intravenous    furosemide (LASIX) tablet     Frequency: Daily     Dose: 160 mg     Route: Oral    glucagon (GLUCAGEN DIAGNOSTIC KIT) injection 1 mg     Linked Order: Or     Frequency: Q15 Min PRN     Dose: 1 mg     Route: Subcutaneous    glucagon (GLUCAGEN DIAGNOSTIC KIT) injection 1 mg     Linked Order: Or     Frequency: Q15 Min PRN     Dose: 1 mg     Route: IntraMUSCULAR    guaiFENesin (MUCINEX) extended release tablet - for cough (expectorant)     Frequency: Q12H     Dose: 600 mg     Route: Oral    hydrALAZINE (APRESOLINE) tablet     Frequency: Q6H PRN     Dose: 50 mg     Route: Oral    isosorbide mononitrate (IMDUR) 24 hr extended release tablet     Frequency: QAM     Dose: 120 mg     Route: Oral    labetalol (NORMODYNE) tablet     Frequency: Q12H     Dose: 300 mg     Route: Oral    loratadine (CLARITIN) tablet  Frequency: Daily     Dose: 10 mg     Route: Oral    montelukast (SINGULAIR) 10 mg tablet     Frequency: QPM     Dose: 10 mg     Route: Oral    morphine 2 mg/mL injection     Frequency: Q4H PRN     Dose: 2 mg     Route: Intravenous    NS flush syringe     Frequency: Q8H     Dose: 10 mL     Route:  Intravenous    ondansetron (ZOFRAN) tablet     Frequency: Q12H PRN     Dose: 4 mg     Route: Oral    pantoprazole (PROTONIX) delayed release tablet     Frequency: Daily     Dose: 40 mg     Route: Oral    SSIP insulin R human (HUMULIN R) 100 units/mL injection     Frequency: 4x/day AC     Dose: 0-12 Units     Route: Subcutaneous    vancomycin (VANCOCIN) 1,500 mg in NS 500 mL IVPB     Frequency: Give in Dialysis     Dose: 20 mg/kg (Adjusted)     Route: Intravenous    Vancomycin IV - Pharmacist to Dose per Protocol     Frequency: Daily PRN     Route: Does not apply        ROS:  GENERAL: The patient denies any weight change, dizziness.  SKIN: No rashes or sores.  HEAD: No trauma, no headache. No dizziness.  EYES: No blurriness, no acute visual loss.  EARS: No hearing loss, no tinnitus.  THROAT: No bleeding gums, no sore throat. No drainage.  CARDIAC: As per HPI.  RESPIRATORY: As per HPI.  GI: No nausea, vomiting or diarrhea. No abdominal pain.  GU: No polyuria, no dysuria. No urgency.  MUSCULOSKELETAL: No muscle weakness   EXTREMITIES: No swelling noted.  NEUROLOGICAL: No numbness, tingling or tremors.  HEMATOLOGICAL: No easy bruising or bleeding.      EXAM:  Temperature: 36.4 C (97.6 F)  Heart Rate: 67  BP (Non-Invasive): (!) 150/103  Respiratory Rate: 17  SpO2: 96 %  Gen:  Awake alert oriented x3 without any obvious respiratory distress  Head:  Normocephalic/atraumatic  Eyes:  Pupils equally round and reactive light  ENT:  Membranes moist oropharynx free erythema and exudate or thrush.  No new lesions, rashes, or ulcerations.  Neck:  Supple, with normal range motion.  No adenopathy or thyromegaly  CV:  Regular rate and rhythm without murmurs rubs or gallops. Marland Kitchen  RESP:   Decreased air entry with scattered crackles  ABDOMEN:  Abdomen is soft, nontender, nondistended.  Bowel sounds normoactive.  NEURO:  Essentially unremarkable  EXTREMITIES:  Trace pitting edema without any cyanosis or clubbing      Studies:  I have  reviewed all available studies within the electronic medical record.    Labs:      BMP:  BMP (Last 24 Hours):    Recent Results last 24 hours     08/30/21  0527   SODIUM 136   POTASSIUM 4.0   CHLORIDE 96*   CO2 27   BUN 45*   CREATININE 9.73*   CALCIUM 9.2   GLUCOSENF 90       CBC Results Differential Results   Recent Labs     08/30/21  0527   WBC 8.6   HGB 8.5*   HCT 24.8*  PLTCNT 186    Recent Results (from the past 30 hour(s))   CBC WITH DIFF    Collection Time: 08/29/21  8:22 AM   Result Value    WBC 9.2    NEUTROPHIL % 74    LYMPHOCYTE % 11 (L)    MONOCYTE % 9    EOSINOPHIL % 5    BASOPHIL % 1    BASOPHIL # 0.10        Hepatic Function:    Recent Labs     08/30/21  0527   ALBUMIN 4.0     PT:  No results found for this encounter  INR:   No results found for this encounter  PTT:   No results found for this encounter  Most Recent Cardiac Markers:  No results found for this encounter  Blood Gas: No results found for this encounter  Lipid Panel:  No results found for this encounter  TSH:  No results found for this encounter    Imaging Studies:    Results for orders placed or performed during the hospital encounter of 08/28/21 (from the past 72 hour(s))   XR AP MOBILE CHEST     Status: None    Narrative    Marcile Supinski    RADIOLOGIST: Ileene Hutchinson    XR AP MOBILE CHEST performed on 08/28/2021 2:50 AM    CLINICAL HISTORY: chest pain.  chest pain    TECHNIQUE: Frontal view of the chest.    COMPARISON:  Chest radiograph dated 08/26/2021    FINDINGS:  An SVC stent is in place. A right IJ dialysis catheter terminates near the superior cavoatrial junction.   The heart size is normal.   Hazy bilateral mid lower lung probably airspace opacities have slightly increased.           Impression    Worsening pulmonary edema      Radiologist location ID: [id number]         Echo: _0 @     Last Pathology/Cytology Result   SURGICAL PATHOLOGY SPECIMEN (Collected: 12/08/2020 12:51 PM)   Result Value Ref Range     Final Diagnosis       A) RIGHT BREAST SKIN, EXCISION:  - Skin with stromal edema. Negative for neoplasm. See note.    Note: Step levels and cytokeratin AE1/3 IHC is performed and is negative for carcinoma cells. Neoplasm is not identified. Clinical correlation is recommended. This case was reviewed by a second pathologist as part of the [place] Program.      Gross Description       A: Skin  Specimen is received in formalin in one container labeled with the patient's name and "right breast skin", and consists of a 1.3 x 0.5 x 0.2 cm tan edematous skin excision and a 0.7 x 0.4 x 0.2 cm piece of tan white soft tissue. The skin is inked green and serially sectioned. All submitted in one cassette. (RM)  Removed from patient: 12/08/2020 12:51   Placed in formalin: 12/08/2020 12:54  Cold ischemia time: <0.1 hr  Formalin fixation time: 57.1 hr (RM)         DNR Status:  Full Code    Assessment:   Active Hospital Problems    Diagnosis   . Primary Problem: Acute respiratory failure with hypoxia (CMS HCC)   . Anemia in chronic kidney disease   . LVH (left ventricular hypertrophy)   . ESRD (end stage renal disease) on dialysis (CMS HCC)   .  Uncontrolled hypertension   . Secondary hyperparathyroidism of renal origin (CMS HCC)       Assessment and Recommendations:  Hemoptysis is most probably secondary to acute tracheobronchial infections and anticoagulation   She was started on anticoagulation couple of weeks ago   Please minimize dose of Eliquis  Hemoptysis appears mild   IV antibiotics  If hemoptysis get worse patient may require bronchoscopy and may have to discontinue anticoagulation is completely   Recent CT scan of the chest did not show any evidence of pulmonary emboli   Will reassess the need of anticoagulation depends upon risk benefit and depends upon the course of hemoptysis   Stop smoking completely   Chest x-ray and CT scan both showed evidence of CHF also   Obtain sputum for Gram stain  and cultures    Thank you for allowing me to participate in this patient's care, I will follow along with you during this hospital course, If you have  any questions please do not hesitate to contact me any time.  On the day of the encounter, a total of  55 minutes was spent on this patient encounter including review of historical information, examination, documentation and post-visit activities, reviewing radiological studies and discussion with nursing staff         Unk Lightning MD,FCCP,FASM  Board Certified ,[facility] [facility]    This note has been created with voice recognition software.  Please excuse any errors in transcription.  Occasional wrong word or sound alike substitutions may have occurred due to the inherent limitations of voice recognition software.  Please read the chart carefully and recognize using context with the substitutions may have occurred.  If you find any mistake or needs clarification please contact me any time

## 2021-08-30 NOTE — Nurses Notes (Signed)
Per nursing staff, patient refused chest CT.

## 2021-08-30 NOTE — Nurses Notes (Addendum)
Patient refuses CT of chest, reports she just had CT and it was okay. Patient educated on why she should have CT d/t hemoptysis. Patient reports this started after starting Eliquis.

## 2021-08-30 NOTE — Nurses Notes (Signed)
Rounded with Dr. Ronnald Ramp while patient in dialysis. Patient mentioned to physician about coughing up blood clots. New order received for CT of chest without contrast and consult to V. Patel. Orders noted.

## 2021-08-31 DIAGNOSIS — F17200 Nicotine dependence, unspecified, uncomplicated: Secondary | ICD-10-CM | POA: Insufficient documentation

## 2021-08-31 MED ORDER — ALBUTEROL SULFATE 2.5 MG/3 ML (0.083 %) SOLUTION FOR NEBULIZATION
2.5000 mg | INHALATION_SOLUTION | Freq: Once | RESPIRATORY_TRACT | Status: AC
Start: 2021-08-31 — End: 2021-08-31
  Administered 2021-08-31: 2.5 mg via RESPIRATORY_TRACT
  Filled 2021-08-31: qty 3

## 2021-08-31 MED ORDER — DOXYCYCLINE HYCLATE 100 MG CAPSULE
100.0000 mg | ORAL_CAPSULE | Freq: Two times a day (BID) | ORAL | 0 refills | Status: DC
Start: 2021-08-31 — End: 2021-10-03

## 2021-08-31 MED ORDER — APIXABAN 5 MG TABLET
2.5000 mg | ORAL_TABLET | Freq: Two times a day (BID) | ORAL | Status: DC
Start: 2021-08-31 — End: 2021-08-31
  Administered 2021-08-31: 2.5 mg via ORAL

## 2021-08-31 MED ORDER — APIXABAN 2.5 MG TABLET
2.5000 mg | ORAL_TABLET | Freq: Two times a day (BID) | ORAL | 0 refills | Status: DC
Start: 2021-08-31 — End: 2021-10-21

## 2021-08-31 MED ORDER — CLONIDINE HCL 0.2 MG TABLET
0.2000 mg | ORAL_TABLET | Freq: Two times a day (BID) | ORAL | 0 refills | Status: DC
Start: 2021-08-31 — End: 2021-10-03

## 2021-08-31 NOTE — Nurses Notes (Signed)
Patient with resting room air sat or 86%. Patient had shortness of breath on room air. Recovered back up to 94% on 2L via nasal cannula.

## 2021-08-31 NOTE — Progress Notes (Signed)
Subjective: improved sputum color. Mild nose bleed.  Objective:  Vital signs: (most recent): Blood pressure (!) 140/101, pulse 64, temperature 36.2 C (97.2 F), resp. rate 18, height 1.702 m (5' 7.01"), weight 83 kg (182 lb 15.7 oz), SpO2 99 %.    : NAD.        ROS  Constitutional- No fever, night sweats, fatigue  EYE- no blurred vision, double vision  ENT-, no difficulty swallowing, no symptoms of oral candidiasis  Lungs- no complaint of respiratory distress, no wheezing  CV- no chest pain, palpitation  GI- no nausea, vomiting, diarrhea, no abdominal pain  GU- no difficulty voiding, no flank pain  MS- no significant edema  Remainder of 10 point review of system is negative    Patient Data   Patient Vitals for the past 24 hrs:   BP Temp Pulse Resp SpO2   08/31/21 1239 (!) 140/101 36.2 C (97.2 F) 64 18 99 %   08/31/21 1004 -- -- 67 -- --   08/31/21 0827 (!) 156/92 36.6 C (97.8 F) 63 19 99 %   08/31/21 0341 -- -- -- -- 97 %   08/31/21 0109 (!) 148/94 (!) 35.9 C (96.6 F) 70 20 96 %   08/30/21 2242 -- -- 65 -- --   08/30/21 2215 (!) 153/97 36.4 C (97.5 F) 72 (!) 21 93 %   08/30/21 1800 (!) 152/72 -- 73 -- --   08/30/21 1516 (!) 156/106 36.3 C (97.3 F) 77 -- 95 %   08/30/21 1335 (!) 177/116 -- 79 18 97 %     General:  Patient is resting in bed, no acute distress, alert and oriented x3   Eyes:  PERRL, no scleral icterus   HENT:  Normocephalic, atraumatic, oral mucosa is moist and pink, no nasal discharge   Heart:  RRR, S1 and S2 auscultated, no murmurs appreciated   Lungs:  Unlabored respirations.  Lungs are clear to auscultation bilaterally, no wheezes, no rales  Abdomen:  Soft, active bowel sounds, non-tender to palpation, non-distended  Extremities:  Pulses equal in all extremities bilaterally.  Capillary refill less than 3 seconds.  No edema in lower extremities bilaterally   Skin:  Warm and dry.  Not diaphoretic  Neuro:  A&O x 3.  No focal deficits.  Speech intact.  Not tremulous  Psych:  Cooperative, not  agitated    Intake/Output Summary (Last 24 hours) at 08/31/2021 1309  Last data filed at 08/31/2021 0700  Gross per 24 hour   Intake 660 ml   Output --   Net 660 ml     Current Facility-Administered Medications   Medication Dose Route Frequency Provider Last Rate Last Admin   . acetaminophen (TYLENOL) tablet  650 mg Oral Q4H PRN Gardner Candle, FNP-BC       . alcohol 62 % (NOZIN NASAL SANITIZER) nasal solution  1 Each Each Nostril 2x/day Julian Hy, MD   1 Each at 08/31/21 270 107 8081   . apixaban (ELIQUIS) tablet  2.5 mg Oral 2x/day Julian Hy, MD   2.5 mg at 08/31/21 0925   . calcitriol (ROCALTROL) capsule  1 mcg Oral M, W, and F Gardner Candle, FNP-BC   1 mcg at 08/30/21 1337   . cinacalcet (SENSIPAR) tablet  30 mg Oral QPM Gardner Candle, FNP-BC   30 mg at 08/30/21 2223   . cloNIDine (CATAPRES) tablet  0.2 mg Oral 2x/day Richardean Sale, MD   0.2 mg at 08/31/21 0924   . cloNIDine (  CATAPRES-TTS) transdermal patch 0.3 mg  0.3 mg Transdermal Q7 Days Gardner Candle, FNP-BC   0.2 mg at 08/30/21 0830   . clopidogrel (PLAVIX) 75 mg tablet  75 mg Oral Daily Gardner Candle, FNP-BC   75 mg at 08/31/21 0626   . cyclobenzaprine (FLEXERIL) tablet  5 mg Oral 2x/day PRN Gardner Candle, FNP-BC   5 mg at 08/29/21 2041   . dextrose 50% (0.5 g/mL) injection - syringe  25 g Intravenous Q15 Min PRN Gardner Candle, FNP-BC        Or   . dextrose 50% (0.5 g/mL) injection - syringe  12.5 g Intravenous Q15 Min PRN Gardner Candle, FNP-BC        Or   . glucagon (GLUCAGEN DIAGNOSTIC KIT) injection 1 mg  1 mg Subcutaneous Q15 Min PRN Gardner Candle, FNP-BC        Or   . glucagon (GLUCAGEN DIAGNOSTIC KIT) injection 1 mg  1 mg IntraMUSCULAR Q15 Min PRN Gardner Candle, FNP-BC       . dilTIAZem (CARDIZEM CD) 24 hr extended release capsule  180 mg Oral Daily Gardner Candle, FNP-BC   180 mg at 08/31/21 0925   . doxazosin (CARDURA) tablet  4 mg Oral QPM Gardner Candle, FNP-BC   4 mg at 08/30/21 2224   . doxycycline hyclate 100  mg in NS 100 mL IVPB minibag  100 mg Intravenous Q12H Unk Lightning, MD   Stopped at 08/31/21 0447   . furosemide (LASIX) tablet  160 mg Oral Daily Gardner Candle, FNP-BC   160 mg at 08/31/21 0925   . guaiFENesin [facility]) extended release tablet - for cough (expectorant)  600 mg Oral Q12H Gardner Candle, FNP-BC   600 mg at 08/31/21 9485   . hydrALAZINE (APRESOLINE) tablet  50 mg Oral Q6H PRN Julian Hy, MD   50 mg at 08/30/21 1349   . isosorbide mononitrate (IMDUR) 24 hr extended release tablet  120 mg Oral QAM Gardner Candle, FNP-BC   120 mg at 08/31/21 4627   . labetalol (NORMODYNE) tablet  300 mg Oral Q12H Gardner Candle, FNP-BC   300 mg at 08/31/21 0350   . lactulose (ENULOSE) 10g per 1m oral liquid  30 mL Oral Daily JJulian Hy MD   30 mL at 08/31/21 0924   . loratadine (CLARITIN) tablet  10 mg Oral Daily SGardner Candle FNP-BC   10 mg at 08/31/21 0925   . montelukast (SINGULAIR) 10 mg tablet  10 mg Oral QPM SGardner Candle FNP-BC   10 mg at 08/30/21 2224   . morphine 2 mg/mL injection  2 mg Intravenous Q4H PRN JJulian Hy MD       . NS flush syringe  10 mL Intravenous Q8H JJulian Hy MD   10 mL at 08/31/21 0700   . ondansetron (ZOFRAN) tablet  4 mg Oral Q12H PRN SGardner Candle FNP-BC       . pantoprazole (PROTONIX) delayed release tablet  40 mg Oral Daily SGardner Candle FNP-BC   40 mg at 08/31/21 0925   . SSIP insulin R human (HUMULIN R) 100 units/mL injection  0-12 Units Subcutaneous 4x/day AC JJulian Hy MD       . vancomycin (VANCOCIN) 1,500 mg in NS 500 mL IVPB  20 mg/kg (Adjusted) Intravenous Give in Dialysis JJulian Hy MD       . Vancomycin IV - Pharmacist to Dose per Protocol   Does not apply Daily PRN JJulian Hy MD  Active Hospital Problems    Diagnosis   . Primary Problem: Acute respiratory failure with hypoxia (CMS HCC)   . Hemoptysis   . Anemia in  chronic kidney disease   . LVH (left ventricular hypertrophy)   . ESRD (end stage renal disease) on dialysis (CMS HCC)   . Uncontrolled hypertension   . Secondary hyperparathyroidism of renal origin (CMS [facility])     Lab Review:  I have reviewed all lab results.    Image Review:  I have reviewed all available imaging results.       Assessment and Plan:    1. ESRD: continue HD as scheduled.  2. HTN: wll need adjustment to meds. PRN hydralazine.  3. Chronic anemia: HGB has trended down. Transfuse 2 units of PRBCs.  4. Acute respiratory failure with hypoxia: from fluid overload due to renal failure. Wean off O2 as tolerated.  5. Hemoptysis: seen by pulmonologist who suspects bronchitis. Started on abx. Pulmonary care.  6. Disposition: DC home.    Julian Hy, MD

## 2021-08-31 NOTE — Consults (Signed)
[facility]        PULMONARY MEDICINE CONSULT FOLLOW UP NOTE  [name], [name], 46 y.o. Malone  Date of Birth:  August 07, 1975  Encounter Start Date:  08/28/2021  Inpatient Admission Date:   Date of service: 08/31/2021    Service: Pulmonary Medicine    HPI:  [name] is a 46 y.o. Malone did not have any hemoptysis today does not any chest pain fever chills does have cough with whitish expectoration currently requiring continuous oxygen.    Historical Data   Past Medical History:   Diagnosis Date   . Asthma    . Chronic diastolic CHF (congestive heart failure) (CMS [facility])    . Esophageal reflux    . ESRD (end stage renal disease) (CMS [place])    . History of anemia due to CKD    . Hypertension    . MI (myocardial infarction) (CMS [place])    . Mitral valve regurgitation    . Pulmonary edema    . Sleep apnea    . Tricuspid valve regurgitation      Past Surgical History:   Procedure Laterality Date   . ESOPHAGOGASTRODUODENOSCOPY     . HX BACK SURGERY     . HX CHOLECYSTECTOMY     . HX FOOT SURGERY Right    . HX HYSTERECTOMY     . HX TONSILLECTOMY       Allergies   Allergen Reactions   . Lisinopril Swelling     Tongue and throat swelling     Family History  Family Medical History:     Problem Relation (Age of Onset)    Breast Cancer Mother    Coronary Artery Disease Mother    Diabetes type II Mother    Hypertension (High Blood Pressure) Father         Social History  Social History     Tobacco Use   . Smoking status: Every Day     Packs/day: 1.00     Years: 10.00     Pack years: 10.00     Types: Cigarettes   . Smokeless tobacco: Former   . Tobacco comments:     "Haven't had a cigerette in 2 weeks."   Vaping Use   . Vaping Use: Never used   Substance Use Topics   . Alcohol use: Not Currently   . Drug use: Yes     Types: Marijuana            Medications Prior to Admission     Prescriptions    acetaminophen (TYLENOL) 325 mg Oral Tablet    Take 2 Tablets (650 mg total) by mouth Every 4 hours as needed     albuterol sulfate (PROVENTIL OR VENTOLIN OR PROAIR) 90 mcg/actuation Inhalation oral inhaler    Take 1-2 Puffs by inhalation Every 6 hours as needed    apixaban (ELIQUIS) 5 mg Oral Tablet    Take 1 Tablet (5 mg total) by mouth Twice daily for 30 days    budesonide-formoteroL (SYMBICORT) 160-4.5 mcg/actuation Inhalation oral inhaler    Take 2 Puffs by inhalation Twice daily    calcitrioL (ROCALTROL) 0.5 mcg Oral Capsule    Take 2 Capsules (1 mcg total) by mouth Every Monday, Wednesday and Friday    cinacalcet (SENSIPAR) 30 mg Oral Tablet    Take 1 Tablet (30 mg total) by mouth Every evening    cloNIDine (CATAPRES-TTS) 0.3 mg/24 hr Transdermal Patch Weekly    Place 1 Patch (  0.3 mg total) on the skin Every 7 days    clopidogreL (PLAVIX) 75 mg Oral Tablet    Take 1 Tablet (75 mg total) by mouth Once a day    cyclobenzaprine (FLEXERIL) 5 mg Oral Tablet    Take 1 Tablet (5 mg total) by mouth Twice per day as needed for Muscle spasms    dilTIAZem (CARDIZEM CD) 180 mg Oral Capsule, Sust. Release 24 hr    Take 1 Capsule (180 mg total) by mouth Once a day for 30 days    doxazosin (CARDURA) 4 mg Oral Tablet    Take 1 Tablet (4 mg total) by mouth Every evening    ferric citrate (AURYXIA ORAL)    Take 1 g by mouth Three times daily before meals "Take 2 tablets three times daily before meals and 1 tablet before snacks"    furosemide (LASIX) 80 mg Oral Tablet    Take 2 Tablets (160 mg total) by mouth Once a day Take after dialysis on dialysis days; otherwise take in morning daily.    guaiFENesin (MUCINEX) 600 mg Oral Tablet Extended Release 12hr    Take 1 Tablet (600 mg total) by mouth Every 12 hours    isosorbide mononitrate (IMDUR) 120 mg Oral Tablet Sustained Release 24 hr    Take 1 Tablet (120 mg total) by mouth Every morning for 30 days    labetaloL (NORMODYNE) 300 mg Oral Tablet    Take 1 Tablet (300 mg total) by mouth Every 12 hours for 30 days    loratadine (CLARITIN) 10 mg Oral Tablet    Take 1 Tablet (10 mg total) by  mouth Once a day    montelukast (SINGULAIR) 10 mg Oral Tablet    Take 1 Tablet (10 mg total) by mouth Every evening    nitroGLYCERIN (NITROSTAT) 0.4 mg Sublingual Tablet, Sublingual    Place 1 Tablet (0.4 mg total) under the tongue Every 5 minutes for 3 doses for 3 doses over 15 minutes    ondansetron (ZOFRAN) 4 mg Oral Tablet    Take 1 Tablet (4 mg total) by mouth Every 12 hours as needed for Nausea/Vomiting    pantoprazole (PROTONIX) 40 mg Oral Tablet, Delayed Release (E.C.)    Take 1 Tablet (40 mg total) by mouth Once a day    vancomycin 1,500 mg in NS 15 mL infusion    1,500 mg Give in Dialysis Mix and infuse per policy of [facility].        acetaminophen (TYLENOL) tablet, 650 mg, Oral, Q4H PRN  alcohol 62 % (NOZIN NASAL SANITIZER) nasal solution, 1 Each, Each Nostril, 2x/day  apixaban (ELIQUIS) tablet, 2.5 mg, Oral, 2x/day  calcitriol (ROCALTROL) capsule, 1 mcg, Oral, M, W, and F  cinacalcet (SENSIPAR) tablet, 30 mg, Oral, QPM  cloNIDine (CATAPRES) tablet, 0.2 mg, Oral, 2x/day  cloNIDine (CATAPRES-TTS) transdermal patch 0.3 mg, 0.3 mg, Transdermal, Q7 Days  clopidogrel (PLAVIX) 75 mg tablet, 75 mg, Oral, Daily  cyclobenzaprine (FLEXERIL) tablet, 5 mg, Oral, 2x/day PRN  dextrose 50% (0.5 g/mL) injection - syringe, 25 g, Intravenous, Q15 Min PRN   Or  dextrose 50% (0.5 g/mL) injection - syringe, 12.5 g, Intravenous, Q15 Min PRN   Or  glucagon (GLUCAGEN DIAGNOSTIC KIT) injection 1 mg, 1 mg, Subcutaneous, Q15 Min PRN   Or  glucagon (GLUCAGEN DIAGNOSTIC KIT) injection 1 mg, 1 mg, IntraMUSCULAR, Q15 Min PRN  dilTIAZem (CARDIZEM CD) 24 hr extended release capsule, 180 mg, Oral, Daily  doxazosin (CARDURA) tablet,  4 mg, Oral, QPM  doxycycline hyclate 100 mg in NS 100 mL IVPB minibag, 100 mg, Intravenous, Q12H  furosemide (LASIX) tablet, 160 mg, Oral, Daily  guaiFENesin (MUCINEX) extended release tablet - for cough (expectorant), 600 mg, Oral, Q12H  hydrALAZINE (APRESOLINE) tablet, 50 mg, Oral, Q6H  PRN  isosorbide mononitrate (IMDUR) 24 hr extended release tablet, 120 mg, Oral, QAM  labetalol (NORMODYNE) tablet, 300 mg, Oral, Q12H  lactulose (ENULOSE) 10g per 57m oral liquid, 30 mL, Oral, Daily  loratadine (CLARITIN) tablet, 10 mg, Oral, Daily  montelukast (SINGULAIR) 10 mg tablet, 10 mg, Oral, QPM  morphine 2 mg/mL injection, 2 mg, Intravenous, Q4H PRN  NS flush syringe, 10 mL, Intravenous, Q8H  ondansetron (ZOFRAN) tablet, 4 mg, Oral, Q12H PRN  pantoprazole (PROTONIX) delayed release tablet, 40 mg, Oral, Daily  SSIP insulin R human (HUMULIN R) 100 units/mL injection, 0-12 Units, Subcutaneous, 4x/day AC  vancomycin (VANCOCIN) 1,500 mg in NS 500 mL IVPB, 20 mg/kg (Adjusted), Intravenous, Give in Dialysis  Vancomycin IV - Pharmacist to Dose per Protocol, , Does not apply, Daily PRN      Active Orders   Lab    VANCOMYCIN, RANDOM     Frequency: ONE TIME     Number of Occurrences: 1 Occurrences   Diet    DIET RENAL Do you want to initiate MNT Protocol? Yes; Restrict fluids to: 1200 ML     Frequency: All Meals     Number of Occurrences: 1 Occurrences   Nursing    ACTIVITY Activity: AS TOLERATED; Instructions: WITH ASSIST; Weight bearing limits: NO WEIGHT-BEARING LIMITATIONS     Frequency: UNTIL DISCONTINUED     Number of Occurrences: Until Specified    APIXABAN NURSING ORDER     Frequency: UNTIL DISCONTINUED     Number of Occurrences: Until Specified     Order Comments: Nursing Instructions:    1.  Print apixaban (Eliquis) guide using the link in this order.   2.  Nurse to provide apixaban patient guide to all patients and be sure that all new starts have received education by the trained anticoagulation educator.  If additional questions, contact pharmacy.           INTAKE AND OUTPUT QSHIFT     Frequency: QSHIFT     Number of Occurrences: Until Specified    MISCELLANEOUS MD/DO TO NURSE     Frequency: UNTIL DISCONTINUED     Number of Occurrences: Until Specified     Order Comments: Home O2 at 2 liters       MISCELLANEOUS MD/DO TO NURSE     Frequency: UNTIL DISCONTINUED     Number of Occurrences: Until Specified     Order Comments: Please make patient a follow up appointment with Dr. VSherrye Payorin 2 weeks. [id number] [phone]      NOTIFY MD     Frequency: PRN     Number of Occurrences: Until Specified    Notify MD Vital Signs     Frequency: PRN     Number of Occurrences: Until Specified    OXYGEN CHALLENGE FOR DISCHARGE - NURSING TO PERFORM     Frequency: ONE TIME     Number of Occurrences: 1 Occurrences     Order Comments: If greater than 4LPM of Oxygen is prescribed, patient must have a resting pulse ox on 4LPM documented.    Please perform the following steps and record values (single numbers - not ranges) in the FLOWSHEET under ADULT PCS BODY SYS tab  under RESPIRATORY in group labeled  Home Oxygen Assessment.    Oxygen saturation on room air at rest - If <89% no further testing required   Oxygen saturation on room air with ambulation - If >89% no further testing required.   Oxygen amount applied (LPM)   Oxygen saturation on oxygen with ambulation  This cannot be performed more than 48 hours in advance of the patient's discharge.    Please contact Care Manager with questions.         OXYGEN CHALLENGE FOR DISCHARGE - NURSING TO PERFORM     Frequency: ONE TIME     Number of Occurrences: 1 Occurrences     Order Comments: If greater than 4LPM of Oxygen is prescribed, patient must have a resting pulse ox on 4LPM documented.    Please perform the following steps and record values (single numbers - not ranges) in the FLOWSHEET under ADULT PCS BODY SYS tab under RESPIRATORY in group labeled  Home Oxygen Assessment.    Oxygen saturation on room air at rest - If <89% no further testing required   Oxygen saturation on room air with ambulation - If >89% no further testing required.   Oxygen amount applied (LPM)   Oxygen saturation on oxygen with ambulation  This cannot be performed more than 48 hours in advance of the  patient's discharge.    Please contact Care Manager with questions.         PT IS HIGH RISK FOR VENOUS THROMBOEMBOLISM     Frequency: CONTINUOUS     Number of Occurrences: Until Specified    PULSE OXIMETRY Q4H     Frequency: Q4H     Number of Occurrences: Until Specified    REFERRAL TO [email or web address] - FOR ENROLLMENT IN FREE, ONLINE GROUP CLASSES FOR TOBACCO CESSATION     Frequency: ONE TIME     Number of Occurrences: 1 Occurrences     Order Comments: Visit [email or web address] to access FREE online modules to take at your own pace or group classes to help you quit tobacco.       Scheduling Instructions:      Visit [email or web address] to access FREE online modules to take at your own pace or group classes to help you quit tobacco.    TAP WATER ENEMA     Frequency: ONE TIME     Number of Occurrences: 1 Occurrences    TELEMETRY MONITORING - Continuous     Frequency: CONTINUOUS     Number of Occurrences: Until Specified    TOBACCO CESSATION: I HAVE DISCUSSED REFERRAL FOR TOBACCO CESSATION COUNSELING AND INITIATION OF NICOTINE REPLACMENT THERAPY WITH THIS PATIENT.     Frequency: ONE TIME     Number of Occurrences: 1 Occurrences    VITAL SIGNS  Q4H     Frequency: Q4H     Number of Occurrences: Until Specified    WAS PATIENT ON APIXABAN PRIOR TO ADMISSION?     Frequency: UNTIL DISCONTINUED     Number of Occurrences: Until Specified    WEIGH PATIENT PRE AND POST DIALYSIS TREATMENT     Frequency: EVERY OTHER DAY     Number of Occurrences: Until Specified   Code Status    FULL CODE     Frequency: CONTINUOUS     Number of Occurrences: Until Specified   Consult    IP CONSULT TO CARE MANAGEMENT     Frequency: ONE TIME     Number of Occurrences: 1 Occurrences     Order  Comments: Help with Home O2      IP CONSULT TO NEPHROLOGY On-Call Provider (nurse/clerk to determine)     Frequency: ONE TIME     Number of Occurrences: 1 Occurrences    IP CONSULT TO PALLIATIVE CARE     Frequency: ONE TIME     Number of Occurrences: 1 Occurrences     IP CONSULT TO PULMONOLOGY Requested Provider; Posey Pronto, VISHNU     Frequency: ONE TIME     Number of Occurrences: 1 Occurrences   Respiratory Care    OXYGEN - NASAL CANNULA     Frequency: CONTINUOUS     Number of Occurrences: Until Specified     Order Comments: Flowrate should not exeed 6L/min except [facility] facility.          Point of Care Testing    PERFORM POC WHOLE BLOOD GLUCOSE     Frequency: TID AC & HS     Number of Occurrences: Until Specified   Blood Bank    CROSSMATCH RED CELLS - UNITS , 2 Units     Frequency: Once     Number of Occurrences: 1 Occurrences   Dialysis    HEMODIALYSIS     Frequency: Q Mon/Wed/Fri (0800)     Number of Occurrences: Until Specified   Discharge    DISCHARGE PATIENT     Frequency: ONE TIME     Number of Occurrences: 1 Occurrences   Schedule Follow Up Orders    REFERRAL TO [place] QUITLINE - TOBACCO CESSATION     Frequency: ONE TIME     Number of Occurrences: 1 Occurrences     Order Comments: Please note, placing this order means you have discussed this program with the patient and they are willing to participate.     Medications    acetaminophen (TYLENOL) tablet     Frequency: Q4H PRN     Dose: 650 mg     Route: Oral    alcohol 62 % (NOZIN NASAL SANITIZER) nasal solution     Frequency: 2x/day     Dose: 1 Each     Route: Each Nostril    apixaban (ELIQUIS) tablet     Frequency: 2x/day     Dose: 2.5 mg     Route: Oral    calcitriol (ROCALTROL) capsule     Frequency: M, W, and F     Dose: 1 mcg     Route: Oral    cinacalcet (SENSIPAR) tablet     Frequency: QPM     Dose: 30 mg     Route: Oral    cloNIDine (CATAPRES) tablet     Frequency: 2x/day     Dose: 0.2 mg     Route: Oral    cloNIDine (CATAPRES-TTS) transdermal patch 0.3 mg     Frequency: Q7 Days     Dose: 0.3 mg     Route: Transdermal    clopidogrel (PLAVIX) 75 mg tablet     Frequency: Daily     Dose: 75 mg     Route: Oral    cyclobenzaprine (FLEXERIL) tablet     Frequency: 2x/day PRN     Dose: 5 mg     Route: Oral    dextrose 50% (0.5  g/mL) injection - syringe     Linked Order: Or     Frequency: Q15 Min PRN     Dose: 25 g     Route: Intravenous    dextrose 50% (0.5 g/mL) injection - syringe  Linked Order: Or     Frequency: Q15 Min PRN     Dose: 12.5 g     Route: Intravenous    dilTIAZem (CARDIZEM CD) 24 hr extended release capsule     Frequency: Daily     Dose: 180 mg     Route: Oral    doxazosin (CARDURA) tablet     Frequency: QPM     Dose: 4 mg     Route: Oral    doxycycline hyclate 100 mg in NS 100 mL IVPB minibag     Frequency: Q12H     Dose: 100 mg     Route: Intravenous    furosemide (LASIX) tablet     Frequency: Daily     Dose: 160 mg     Route: Oral    glucagon (GLUCAGEN DIAGNOSTIC KIT) injection 1 mg     Linked Order: Or     Frequency: Q15 Min PRN     Dose: 1 mg     Route: Subcutaneous    glucagon (GLUCAGEN DIAGNOSTIC KIT) injection 1 mg     Linked Order: Or     Frequency: Q15 Min PRN     Dose: 1 mg     Route: IntraMUSCULAR    guaiFENesin (MUCINEX) extended release tablet - for cough (expectorant)     Frequency: Q12H     Dose: 600 mg     Route: Oral    hydrALAZINE (APRESOLINE) tablet     Frequency: Q6H PRN     Dose: 50 mg     Route: Oral    isosorbide mononitrate (IMDUR) 24 hr extended release tablet     Frequency: QAM     Dose: 120 mg     Route: Oral    labetalol (NORMODYNE) tablet     Frequency: Q12H     Dose: 300 mg     Route: Oral    lactulose (ENULOSE) 10g per 65m oral liquid     Frequency: Daily     Dose: 30 mL     Route: Oral    loratadine (CLARITIN) tablet     Frequency: Daily     Dose: 10 mg     Route: Oral    montelukast (SINGULAIR) 10 mg tablet     Frequency: QPM     Dose: 10 mg     Route: Oral    morphine 2 mg/mL injection     Frequency: Q4H PRN     Dose: 2 mg     Route: Intravenous    NS flush syringe     Frequency: Q8H     Dose: 10 mL     Route: Intravenous    ondansetron (ZOFRAN) tablet     Frequency: Q12H PRN     Dose: 4 mg     Route: Oral    pantoprazole (PROTONIX) delayed release tablet     Frequency: Daily      Dose: 40 mg     Route: Oral    SSIP insulin R human (HUMULIN R) 100 units/mL injection     Frequency: 4x/day AC     Dose: 0-12 Units     Route: Subcutaneous    vancomycin (VANCOCIN) 1,500 mg in NS 500 mL IVPB     Frequency: Give in Dialysis     Dose: 20 mg/kg (Adjusted)     Route: Intravenous    Vancomycin IV - Pharmacist to Dose per Protocol     Frequency: Daily PRN     Route:  Does not apply        ROS:    12 point review of systems was obtained and there were no clinically significant change to that described in the subjective findings as described above.      EXAM:  Temperature: 36.2 C (97.2 F)  Heart Rate: 64  BP (Non-Invasive): (!) 140/101  Respiratory Rate: 18  SpO2: 99 %  Gen:  Awake alert oriented x3 without any obvious respiratory distress  Head:  Normocephalic/atraumatic  Eyes:  Pupils equally round and reactive light  ENT:  Membranes moist oropharynx free erythema and exudate or thrush.  No new lesions, rashes, or ulcerations.  Neck:  Supple, with normal range motion.  No adenopathy or thyromegaly  CV:  Regular rate and rhythm without murmurs rubs or gallops. Marland Kitchen  RESP:   Decreased air entry with scattered crackles  ABDOMEN:  Abdomen is soft, nontender, nondistended.  Bowel sounds normoactive.  NEURO:  Essentially unremarkable  EXTREMITIES:  Trace pitting edema without any cyanosis or clubbing      Studies:  I have reviewed all available studies within the electronic medical record.    Labs:      BMP:  BMP (Last 24 Hours):  No results for input(s): SODIUM, POTASSIUM, CHLORIDE, CO2, BUN, CREATININE, CALCIUM, GLUCOSENF in the last 24 hours.    CBC Results Differential Results   No results found for this encounter No results found for this or any previous visit (from the past 30 hour(s)).     Hepatic Function:  No results found for this encounter  PT:  No results found for this encounter  INR:   No results found for this encounter  PTT:   No results found for this encounter  Most Recent Cardiac Markers:  No  results found for this encounter  Blood Gas: No results found for this encounter  Lipid Panel:  No results found for this encounter  TSH:  No results found for this encounter    Imaging Studies:    CXR:   Recent Results (from the past [id number] hour(s))   XR CHEST AP    Collection Time: 08/11/21 12:14 PM    Narrative    Babe Dinneen    RADIOLOGIST: Ileene Hutchinson    XR CHEST AP performed on 08/11/2021 12:14 PM    CLINICAL HISTORY: rales, wheezing. chf prod cough.  CHEST PAIN AND DYSPNEA TODAY    TECHNIQUE: Frontal view of the chest.    COMPARISON:  Chest radiograph dated 08/05/2021    FINDINGS:  A right IJ dialysis catheter terminates in the lower SVC.   The heart size is normal.   Airspace opacities are mild.           Impression    Mild pulmonary edema      Radiologist location ID: [id number]     XR ABD FLAT AND UPRIGHT SERIES (W PA CHEST)    Collection Time: 06/30/21  7:52 AM    Narrative    Tranise Sabbagh    RADIOLOGIST: Sula Rumple    XR ABD FLAT AND UPRIGHT SERIES (W PA CHEST) performed on 06/30/2021 7:52 AM    CLINICAL HISTORY: chest, abdo pain, vomiting. eval for boorhaves.  chest, abd pain, vomiting, eval for boorhaves.     TECHNIQUE: Single view chest with supine and upright views of the abdomen.    COMPARISON:  06/27/2021    FINDINGS:  The heart size is normal.   There are chronic-appearing changes of both lungs. There is  no evidence of pleural effusion or pneumothorax. There is no evidence of pneumomediastinum.     Bowel gas pattern is normal.  No evidence of bowel obstruction.  No free air.    Atherosclerotic vascular calcifications identified.    There are degenerative changes of the spine.        Impression    NO EVIDENCE OF PNEUMOMEDIASTINUM OR PNEUMOTHORAX. IF THERE IS CLINICAL CONCERN FOR BOERHAAVE SYNDROME CT SCAN OF THE THORAX COULD BE PERFORMED.        Radiologist location ID: [id number]     XR CHEST PA AND LATERAL    Collection Time: 07/03/17  3:21 AM    Narrative    CLINICAL DATA: Chest  pain, dyspnea, left arm and neck pain with  nausea for several hours.    EXAM:  CHEST - 2 VIEW    COMPARISON: 06/30/2017    FINDINGS:  Stable borderline cardiomegaly. No aortic aneurysm. Minimal aortic  atherosclerosis is identified. Mild vascular congestion without  acute pulmonary consolidation, effusion or pneumothorax. No acute  osseous abnormality.    IMPRESSION:  Stable borderline cardiomegaly with aortic atherosclerosis. Slight  pulmonary vascular congestion without acute pulmonary consolidation  or pneumothorax.      Electronically Signed   By: Ashley Royalty M.D.   On: 07/03/2017 03:27   XR CHEST AP AND LATERAL    Collection Time: 04/08/14 12:30 PM    Narrative    History: Chest wall pressure.    2 views of the chest are submitted.    Findings: A jugular PermCath terminates in the SVC region. The cardiac  silhouette is normal. There is no infiltrate or effusion. The hilar and  mediastinal contours are normal. The osseous structures are normal.  Clips are noted in the right upper quadrant presumably from a  cholecystectomy.    Impression    Impression: No acute changes are noted.    Approved By: Doreene Eland MD 04/08/2014 12:38 PM     CT:    Recent Results (from the past [id number] hour(s))   CT CHEST ABDOMEN PELVIS WO IV CONTRAST    Collection Time: 07/14/16  3:23 AM    Narrative    EXAM: CH CT CHEST, ABDOMEN, AND PELVIS WITHOUT IV CONTRAST    CLINICAL INDICATION: Pain, not elsewhere classified  left chest pain, epigastric, vomiting, diarrhea, ESRD.    TECHNIQUE: CT scan of the chest, abdomen and pelvis with multiplanar reformatted images generated from the data set without IV contrast. Dose reduction techniques were utilized.     COMPARISON: 09/10/2015 and prior    FINDINGS:     CHEST:    Cardiomediastinum: Heart is normal in size. No adenopathy. Heavy coronary artery calcifications. Trace pericardial effusion.    Soft tissues: The peripheral soft tissues are normal in appearance.     Lungs: Again seen is  groundglass attenuation in the right upper lobe posteriorly (4:32) and in the left upper lobe (4:42), as well as the right lower lobe (4:46). No solid component. No focal airspace consolidation, pneumothorax, or pleural effusion.   Central airways are patent.    Bones: No destructive osseous lesion.    ABDOMEN/PELVIS:    Liver: No focal hepatic lesions are seen.     Gallbladder: Surgically absent. No evidence of intrahepatic or extrahepatic layer dilatation.     Pancreas: Normal.     Spleen: Normal.    Adrenals: Unchanged bilateral diffuse adrenal thickening, left greater than right. No evidence of clinically significant nodularity.    Kidneys:  No evidence of solid renal mass, hydronephrosis, or renal/ureteral calculi. No perinephric stranding or fluid collection.    Gastrointestinal: Scattered colonic diverticula without evidence of diverticulitis. There is apparent long segment colonic wall thickening, although evaluation is degraded by under distention. Normal appendix diameter. No dilated loops of small bowel are   seen.     Pelvis: Somewhat heterogeneous appearance of the uterus. Unremarkable CT appearance of the urinary bladder. Pelvic phleboliths.    Vasculature: Moderate atherosclerotic calcification of the abdominal aorta and branches without aneurysmal dilatation.    Lymph nodes: No enlarged lymph nodes.     Bones: No destructive osseous lesion.    Impression    .     Marland Kitchen    No evidence of acute abnormality in the chest, abdomen, or pelvis. No pneumonia, pneumothorax, or pleural effusion. No bowel obstruction or abscess. Normal appendix. Colonic diverticulosis without evidence of diverticulitis.    Again seen are patchy bilateral areas of groundglass opacity without solid components. Recommend follow-up chest CT in one year with follow-up continued follow-up exams for at least three years following initial exam on 09/10/2015.    Heterogeneous appearance of the uterus. Consider further evaluation with  pelvic ultrasound.    Heavy coronary artery calcifications.    Released By: Annitta Needs, MD 07/14/2016 3:40 AM      MRI: No results found for this or any previous visit (from the past [id number] hour(s)).  Ultrasound: No results found for this or any previous visit (from the past [id number] hour(s)).   Echo:   Recent Results (from the past 2400 hour(s))   TRANSTHORACIC ECHOCARDIOGRAM - ADULT   Result Value Ref Range    AV mean gradient 2.00 mmHg    Ao VTI 23.00 cm    Aortic Valve Area by Continuity of VTI 2.33 cm2    Ao root annulus 3.10 cm    Ao root annulus 3.10 cm    LVOT diameter 2.00 cm    Left Ventricular Outflow Tract Peak Velocity 69.90 cm/s    AV LVOT peak gradient 2.00 mmHg    TDI 3.32 cm/s    TDI 3.32 cm/s    E/E' ratio 32.80     LVOT stroke volume 54.00 cm3    E wave decelartion time 333.00 ms    MV stenosis pressure 1/2 time 97.00 ms    MV Peak A Vel 83.Dawn cm/s    MV Peak E Vel 109.00 cm/s    MV mean gradient 5.00 mmHg    MV Comp VTI 36.40 cm    Mitral Valve Max Velocity 139.00 cm/s    E/A ratio 1.30     Pulmonic Valve Acceleration Time 134.00 ms    Pulmonic Valve Max Velocity 112.00 cm/s    PV peak gradient 5.00 mmHg        Last Pathology/Cytology Result   SURGICAL PATHOLOGY SPECIMEN (Collected: 12/08/2020 12:51 PM)   Result Value Ref Range    Final Diagnosis       A) RIGHT BREAST SKIN, EXCISION:  - Skin with stromal edema. Negative for neoplasm. See note.    Note: Step levels and cytokeratin AE1/3 IHC is performed and is negative for carcinoma cells. Neoplasm is not identified. Clinical correlation is recommended. This case was reviewed by a second pathologist as part of the [facility].      Gross Description       A: Skin  Specimen is received in formalin in one container labeled with the patient's name  and "right breast skin", and consists of a 1.3 x 0.5 x 0.2 cm tan edematous skin excision and a 0.7 x 0.4 x 0.2 cm piece of tan white soft tissue. The skin is inked  green and serially sectioned. All submitted in one cassette. (RM)  Removed from patient: 12/08/2020 12:51   Placed in formalin: 12/08/2020 12:54  Cold ischemia time: <0.1 hr  Formalin fixation time: 57.1 hr (RM)         DNR Status:  Full Code    Assessment:   Active Hospital Problems    Diagnosis   . Primary Problem: Acute respiratory failure with hypoxia (CMS HCC)   . Tobacco use disorder   . Hemoptysis   . Anemia in chronic kidney disease   . LVH (left ventricular hypertrophy)   . ESRD (end stage renal disease) on dialysis (CMS HCC)   . Uncontrolled hypertension   . Secondary hyperparathyroidism of renal origin (CMS [place])       Today's Plan:  Gradual clinical improvement noted   Does not have any hemoptysis anymore   Okay to discharge home on low dose of Eliquis   Follow-up with me in couple of weeks   Home oxygen therapy   Stop smoking completely  P.o. doxycycline for 1 week      On the day of the encounter, a total of  35 minutes was spent on this patient encounter including review of historical information, examination, documentation and post-visit activities, reviewing radiological studies and discussion with nursing staff         Unk Lightning MD,FCCP,FASM  Board Certified ,[place] and Sleep Medicine    This note has been created with voice recognition software.  Please excuse any errors in transcription.  Occasional wrong word or sound alike substitutions may have occurred due to the inherent limitations of voice recognition software.  Please read the chart carefully and recognize using context with the substitutions may have occurred.  If you find any mistake or needs clarification please contact me any time

## 2021-08-31 NOTE — Nurses Notes (Signed)
Discharge instructions given to pt, verbalized understanding. IV site removed catheter intact. PT DC to home with family. Home oxygen delivered here prior to DC. PT left floor in wheelchair and 02.

## 2021-09-02 ENCOUNTER — Other Ambulatory Visit (HOSPITAL_COMMUNITY): Payer: Self-pay

## 2021-09-02 DIAGNOSIS — J9601 Acute respiratory failure with hypoxia: Secondary | ICD-10-CM

## 2021-09-02 LAB — ENTER/EDIT OPAT LABS
ALKALINE PHOSPHATASE: 94
ALT (SGPT): <7
AST (SGOT): 16
BASOPHILS: 0.6
BILIRUBIN, TOTAL: 0.37
C-REACTIVE PROTEIN (CRP),INFLAMMATION: 22.7 mg/L — AB (ref 0.0–2.4)
EOSINOPHIL: 5
HCT: 28.4
HGB: 9.2
LYMPHOCYTES: 10.6
MONOCYTES: 6.5
PLATELET COUNT: 241
PMN'S: 75.5
POTASSIUM: 4.6
WBC: 9.82

## 2021-09-02 NOTE — Care Management Notes (Signed)
[facility] Management Note    Patient Name: [name]  Date of Birth: August 10, 1975  Sex: female  Date/Time of Admission: 08/28/2021  2:16 AM  Room/Bed: 330/B  Payor: HU[employer]NA MEDICARE / Plan: HU[employer] CHOICE PPO / Product Type: PPO /    LOS: 0 days   Primary Care Providers:  Deel, Leona Carry, FNP, FNP (General)    Admitting Diagnosis:  Hypoxia [R09.02]    Assessment:       Discharge Plan:  Home with DME (code 1)  DANIELLE, [facility], ASKED CM FOR 02 ORDER FROM D/C OVER THE WEEKEND. CM PROVIDED DANIELLE WITH ORDER AFTER HOSPITALIST SIGNED IT.    The patient will continue to be evaluated for developing discharge needs.     Case Manager: Lindell Noe, [place]  Phone: [phone]

## 2021-09-03 ENCOUNTER — Encounter (INDEPENDENT_AMBULATORY_CARE_PROVIDER_SITE_OTHER): Payer: Self-pay | Admitting: Student in an Organized Health Care Education/Training Program

## 2021-09-03 NOTE — Progress Notes (Signed)
Spoke to [employer] regarding labs, patient had them drawn yesterday and they should have results by tomorrow afternoon.  Results to be faxed to OPAT once received.    Geologist, engineering  OPAT/Stewardship

## 2021-09-04 LAB — ENTER/EDIT OPAT LABS
BUN: 11
CREATININE: 9.99
VANCOMYCIN, TROUGH: 17.7

## 2021-09-06 ENCOUNTER — Encounter (INDEPENDENT_AMBULATORY_CARE_PROVIDER_SITE_OTHER): Payer: Self-pay | Admitting: Student in an Organized Health Care Education/Training Program

## 2021-09-06 NOTE — Result Encounter Note (Signed)
OPAT Lab Monitoring Note    I have reviewed the patient's OPAT labs from 7/10 and 7/12 received today.   They are stable when compared to previous values.  Please see PharmD note regarding Vanc trough interpretation and dosing.     We will continue with the current course of treatment.       Clovis Pu MD  Section of Infectious Diseases  09/06/2021

## 2021-09-06 NOTE — Progress Notes (Signed)
Called [facility] [phone] spoke to claudia she will fax [place] the labs from this week  [place]  OPAT Nurse   [phone]

## 2021-09-06 NOTE — Progress Notes (Signed)
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

## 2021-09-09 LAB — ENTER/EDIT OPAT LABS
ALT (SGPT): 8
AST (SGOT): 17
BASOPHILS: 0.3
BILIRUBIN, TOTAL: <0.15
C-REACTIVE PROTEIN (CRP),INFLAMMATION: 3.2 mg/L — AB (ref 0.0–2.4)
EOSINOPHIL: 5.2
HCT: 26
HGB: 8.2
LYMPHOCYTES: 12.3
MONOCYTES: 6.2
PLATELET COUNT: 298
PMN'S: 73.7
POTASSIUM: 4.8
VANCOMYCIN, TROUGH: 17.5
WBC: 8.96

## 2021-09-12 ENCOUNTER — Encounter (INDEPENDENT_AMBULATORY_CARE_PROVIDER_SITE_OTHER): Payer: Self-pay | Admitting: Student in an Organized Health Care Education/Training Program

## 2021-09-12 NOTE — Progress Notes (Signed)
Spoke to dialysis center regarding labs, results to be faxed to OPAT.    Geologist, engineering  OPAT/Stewardship

## 2021-09-12 NOTE — Progress Notes (Signed)
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

## 2021-09-12 NOTE — Result Encounter Note (Signed)
OPAT Lab Monitoring Note    I have reviewed the patient's OPAT labs.   They are stable when compared to previous values.  Please see PharmD note regarding Vanc trough interpretation and dosing.     We will continue with the current course of treatment.       [name] E. Latanza Pfefferkorn MD  Section of Infectious Diseases

## 2021-09-16 LAB — ENTER/EDIT OPAT LABS
ALT (SGPT): <7
AST (SGOT): 14
BASOPHILS: 0.2
BILIRUBIN, TOTAL: 0.32
C-REACTIVE PROTEIN (CRP),INFLAMMATION: 8.6 mg/L — AB (ref 0.0–2.4)
EOSINOPHIL: 7.1
HCT: 25.6
HGB: 8
LYMPHOCYTES: 12.4
MONOCYTES: 5.9
PLATELET COUNT: 254
PMN'S: 72.9
POTASSIUM: 5
VANCOMYCIN, TROUGH: 24.7
WBC: 8.26

## 2021-09-20 ENCOUNTER — Encounter (INDEPENDENT_AMBULATORY_CARE_PROVIDER_SITE_OTHER): Payer: Self-pay | Admitting: Student in an Organized Health Care Education/Training Program

## 2021-09-20 NOTE — Result Encounter Note (Signed)
OPAT Lab Monitoring Note    I have reviewed the patient's OPAT labs.   They are stable when compared to previous values.  Please see PharmD note regarding Vanc trough interpretation and dosing.       Clovis Pu MD  Section of Infectious Diseases

## 2021-09-20 NOTE — Progress Notes (Signed)
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

## 2021-09-24 LAB — TYPE AND SCREEN
ABO/RH(D): O POS
ANTIBODY SCREEN: NEGATIVE
UNITS ORDERED: 2

## 2021-09-24 LAB — CROSSMATCH RED CELLS - UNITS
UNIT DIVISION: 0
UNIT DIVISION: 0

## 2021-09-28 ENCOUNTER — Observation Stay (EMERGENCY_DEPARTMENT_HOSPITAL)
Admission: EM | Admit: 2021-09-28 | Discharge: 2021-09-29 | Discharge status: Against Medical Advice | Payer: Commercial Managed Care - PPO | Source: Home / Self Care | Attending: Emergency Medicine | Admitting: Emergency Medicine

## 2021-09-28 ENCOUNTER — Emergency Department (HOSPITAL_COMMUNITY): Payer: Commercial Managed Care - PPO

## 2021-09-28 ENCOUNTER — Other Ambulatory Visit: Payer: Self-pay

## 2021-09-28 ENCOUNTER — Encounter (HOSPITAL_COMMUNITY): Payer: Self-pay

## 2021-09-28 DIAGNOSIS — N186 End stage renal disease: Secondary | ICD-10-CM | POA: Insufficient documentation

## 2021-09-28 DIAGNOSIS — R071 Chest pain on breathing: Secondary | ICD-10-CM

## 2021-09-28 DIAGNOSIS — Z992 Dependence on renal dialysis: Secondary | ICD-10-CM | POA: Insufficient documentation

## 2021-09-28 DIAGNOSIS — I160 Hypertensive urgency: Secondary | ICD-10-CM | POA: Insufficient documentation

## 2021-09-28 DIAGNOSIS — Z9981 Dependence on supplemental oxygen: Secondary | ICD-10-CM | POA: Insufficient documentation

## 2021-09-28 DIAGNOSIS — I132 Hypertensive heart and chronic kidney disease with heart failure and with stage 5 chronic kidney disease, or end stage renal disease: Secondary | ICD-10-CM | POA: Insufficient documentation

## 2021-09-28 DIAGNOSIS — I5032 Chronic diastolic (congestive) heart failure: Secondary | ICD-10-CM | POA: Insufficient documentation

## 2021-09-28 DIAGNOSIS — R079 Chest pain, unspecified: Secondary | ICD-10-CM

## 2021-09-28 DIAGNOSIS — F17210 Nicotine dependence, cigarettes, uncomplicated: Secondary | ICD-10-CM | POA: Insufficient documentation

## 2021-09-28 DIAGNOSIS — Z20822 Contact with and (suspected) exposure to covid-19: Secondary | ICD-10-CM | POA: Insufficient documentation

## 2021-09-28 DIAGNOSIS — I517 Cardiomegaly: Secondary | ICD-10-CM | POA: Diagnosis present

## 2021-09-28 DIAGNOSIS — R778 Other specified abnormalities of plasma proteins: Secondary | ICD-10-CM

## 2021-09-28 DIAGNOSIS — I10 Essential (primary) hypertension: Secondary | ICD-10-CM

## 2021-09-28 HISTORY — DX: Compression of vein: I87.1

## 2021-09-28 HISTORY — DX: Dependence on supplemental oxygen: Z99.81

## 2021-09-28 HISTORY — DX: Essential (primary) hypertension: I10

## 2021-09-28 LAB — CBC WITH DIFF
BASOPHIL #: 0.1 10*3/uL (ref 0.00–0.30)
BASOPHIL %: 1 % (ref 0–3)
EOSINOPHIL #: 0.4 10*3/uL (ref 0.00–0.80)
EOSINOPHIL %: 3 % (ref 0–7)
HCT: 25.2 % — ABNORMAL LOW (ref 37.0–47.0)
HGB: 8.3 g/dL — ABNORMAL LOW (ref 12.5–16.0)
LYMPHOCYTE #: 0.9 10*3/uL — ABNORMAL LOW (ref 1.10–5.00)
LYMPHOCYTE %: 9 % — ABNORMAL LOW (ref 25–45)
MCH: 29.8 pg (ref 27.0–32.0)
MCHC: 33.1 g/dL (ref 32.0–36.0)
MCV: 90.1 fL (ref 78.0–99.0)
MONOCYTE #: 0.8 10*3/uL (ref 0.00–1.30)
MONOCYTE %: 8 % (ref 0–12)
MPV: 6.6 fL — ABNORMAL LOW (ref 7.4–10.4)
NEUTROPHIL #: 8.5 10*3/uL — ABNORMAL HIGH (ref 1.80–8.40)
NEUTROPHIL %: 79 % — ABNORMAL HIGH (ref 40–76)
PLATELETS: 184 10*3/uL (ref 140–440)
RBC: 2.8 10*6/uL — ABNORMAL LOW (ref 4.20–5.40)
RDW: 20.1 % — ABNORMAL HIGH (ref 11.6–14.8)
WBC: 10.7 10*3/uL — ABNORMAL HIGH (ref 4.0–10.5)
WBCS UNCORRECTED: 10.7 10*3/uL

## 2021-09-28 LAB — TROPONIN-I
TROPONIN I: 60 ng/L — ABNORMAL HIGH (ref <15)
TROPONIN I: 69 ng/L — ABNORMAL HIGH (ref <15)

## 2021-09-28 LAB — COVID-19, FLU A/B, RSV RAPID BY PCR
INFLUENZA VIRUS TYPE A: NOT DETECTED
INFLUENZA VIRUS TYPE B: NOT DETECTED
RESPIRATORY SYNCTIAL VIRUS (RSV): NOT DETECTED
SARS-CoV-2: NOT DETECTED

## 2021-09-28 LAB — COMPREHENSIVE METABOLIC PANEL, NON-FASTING
ALBUMIN/GLOBULIN RATIO: 1.3 (ref 0.8–1.4)
ALBUMIN: 4.3 g/dL (ref 3.5–5.7)
ALKALINE PHOSPHATASE: 84 U/L (ref 34–104)
ALT (SGPT): <7 U/L — ABNORMAL LOW (ref 7–52)
ANION GAP: 12 mmol/L (ref 10–20)
AST (SGOT): 13 U/L (ref 13–39)
BILIRUBIN TOTAL: 0.9 mg/dL (ref 0.3–1.2)
BUN/CREA RATIO: 3 — ABNORMAL LOW (ref 6–22)
BUN: 26 mg/dL — ABNORMAL HIGH (ref 7–25)
CALCIUM, CORRECTED: 10.2 mg/dL (ref 8.9–10.8)
CALCIUM: 10.5 mg/dL — ABNORMAL HIGH (ref 8.6–10.3)
CHLORIDE: 102 mmol/L (ref 98–107)
CO2 TOTAL: 22 mmol/L (ref 21–31)
CREATININE: 9.05 mg/dL — ABNORMAL HIGH (ref 0.60–1.30)
ESTIMATED GFR: 5 mL/min/{1.73_m2} — ABNORMAL LOW (ref >59)
GLOBULIN: 3.4 (ref 2.9–5.4)
GLUCOSE: 83 mg/dL (ref 74–109)
OSMOLALITY, CALCULATED: 276 mOsm/kg (ref 270–290)
POTASSIUM: 4.5 mmol/L (ref 3.5–5.1)
PROTEIN TOTAL: 7.7 g/dL (ref 6.4–8.9)
SODIUM: 136 mmol/L (ref 136–145)

## 2021-09-28 MED ORDER — METOPROLOL TARTRATE 5 MG/5 ML INTRAVENOUS SOLUTION
INTRAVENOUS | Status: AC
Start: 2021-09-28 — End: 2021-09-28
  Filled 2021-09-28: qty 5

## 2021-09-28 MED ORDER — NITROGLYCERIN 2 % TRANSDERMAL OINTMENT - PACKET
TOPICAL_OINTMENT | TRANSDERMAL | Status: AC
Start: 2021-09-28 — End: 2021-09-28
  Filled 2021-09-28: qty 1

## 2021-09-28 MED ORDER — MORPHINE 2 MG/ML INJECTION WRAPPER
INJECTION | INTRAMUSCULAR | Status: AC
Start: 2021-09-28 — End: 2021-09-28
  Filled 2021-09-28: qty 1

## 2021-09-28 MED ORDER — NITROGLYCERIN 2 % TRANSDERMAL OINTMENT - PACKET
1.0000 [in_us] | TOPICAL_OINTMENT | TRANSDERMAL | Status: AC
Start: 2021-09-28 — End: 2021-09-28
  Administered 2021-09-28: 1 [in_us] via TOPICAL

## 2021-09-28 MED ORDER — METOPROLOL TARTRATE 5 MG/5 ML INTRAVENOUS SOLUTION
2.5000 mg | INTRAVENOUS | Status: AC
Start: 2021-09-28 — End: 2021-09-28
  Administered 2021-09-28: 2.5 mg via INTRAVENOUS

## 2021-09-28 MED ORDER — MORPHINE 2 MG/ML INJECTION WRAPPER
1.0000 mg | INJECTION | INTRAMUSCULAR | Status: AC
Start: 2021-09-28 — End: 2021-09-28
  Administered 2021-09-28: 1 mg via INTRAVENOUS

## 2021-09-28 NOTE — ED Provider Notes (Addendum)
Emergency Medicine    Name: Shauniece Kwan  Age and Gender: 46 y.o. female  Date of Birth: 03/13/1975  MRN: [id number]  PCP: Cam Hai, FNP    CC:  Chief Complaint   Patient presents with    Chest Pain     Hypertension       HPI:  [name] is a 46 y.o. 79 American female with history of Chest pain which began yesterday after dialysis.  She thought it was due to dialysis.  Pain improved then returned today around lunch time around 1400.     She has slight dyspnea. She takes home oxygen. She has not smoked for 2 months.     She also notes cough and nasal congestion which started this am. She has chest pain with breathing.      She is now on home oxygen.        [place] Pain Rating Scale     On a scale of 0-10, during the past 24 hours, pain has interfered with you usual activity:       On a scale of 0-10, during the past 24 hours, pain has interfered with your sleep:      On a scale of 0-10, during the past 24 hours, pain has affected your mood:       On a scale of 0-10, during the past 24 hours, pain has contributed to your stress:       On a scale of 0-10, what is your overall pain Rating:          Below pertinent information reviewed with patient:  Past Medical History:   Diagnosis Date    Asthma     Chronic diastolic CHF (congestive heart failure) (CMS HCC)     Dependence on supplemental oxygen     Esophageal reflux     ESRD (end stage renal disease) (CMS HCC)     History of anemia due to CKD     Hypertension     MI (myocardial infarction) (CMS HCC)     Mitral valve regurgitation     Pulmonary edema     Sleep apnea     Tricuspid valve regurgitation            Allergies   Allergen Reactions    Lisinopril Swelling     Tongue and throat swelling       Past Surgical History:   Procedure Laterality Date    ESOPHAGOGASTRODUODENOSCOPY      HX BACK SURGERY      HX CHOLECYSTECTOMY      HX FOOT SURGERY Right     HX HYSTERECTOMY      HX TONSILLECTOMY             Social History        Objective:    ED Triage  Vitals [09/28/21 2029]   BP (Non-Invasive) (!) 207/141   Heart Rate 96   Respiratory Rate 18   Temperature 36.8 C (98.2 F)   SpO2 94 %   Weight 86.2 kg (190 lb)   Height 1.702 m (5\' 7" )     Filed Vitals:    09/28/21 2029 09/28/21 2045 09/28/21 2100   BP: (!) 207/141 (!) 199/105 (!) 196/124   Pulse: 96  85   Resp: 18  (!) 22   Temp: 36.8 C (98.2 F)     SpO2: 94%  93%       Nursing notes and vital signs reviewed.    Constitutional -  No acute distress.  Alert and Active.  HEENT - Normocephalic. Atraumatic. PERRL. EOMI. Conjunctiva clear. Oropharynx with no erythema, lesions, or exudates. Moist mucous membranes. Nasal congestion present.  Neck - Trachea midline. No stridor. No hoarseness.  Cardiac - Regular rate and rhythm. No murmurs, rubs, or gallops.  Respiratory - Clear to auscultation bilaterally. No rales, wheezes or rhonchi.  Abdomen - Non-tender, soft, non-distended. No rebound or guarding.   Musculoskeletal - Good AROM. No muscle or joint tenderness appreciated. No clubbing, cyanosis or edema.  Skin - Warm and dry, without any rashes or other lesions.  Neuro - Cranial nerves II-XII are grossly intact.  Moving all extremities symmetrically.    Any pertinent labs and imaging obtained during this encounter reviewed below in MDM.    MDM/ED Course:    EKG shows a sinus rhythm, 1st degree AV block PR 218. QRS 102.  LVH. No STEMI.Some precordial ST elevation does not appear hyperacute.    Patient is very hypertensive as she often is, and has slightly elevated troponin levels, also not unusual. She was given morphine for recurrent pain and also lopressor. She was on a nitroglycerin drip in June for hypertension/chest pain.     Ms. Bera is certainly complicated. I discussed her care with Dr. Deloria Lair who is going to evaluate her in the ED.    Medical Decision Making  Amount and/or Complexity of Data Reviewed  Labs: ordered. Decision-making details documented in ED Course.  Radiology: ordered. Decision-making  details documented in ED Course.  ECG/medicine tests: ordered. Decision-making details documented in ED Course.    Risk  Prescription drug management.  Decision regarding hospitalization.             Orders Placed This Encounter    XR AP MOBILE CHEST    CBC/DIFF    COMPREHENSIVE METABOLIC PANEL, NON-FASTING    TROPONIN-I NOW    TROPONIN-I IN ONE HOUR    TROPONIN-I IN THREE HOOURS    CBC WITH DIFF    COVID-19, FLU A/B, RSV RAPID BY PCR    EXTRA TUBES    BLUE TOP TUBE    OXYGEN - NASAL CANNULA    ECG 12 LEAD    INSERT & MAINTAIN PERIPHERAL IV ACCESS    nitroGLYCERIN (NITRO-BID) 2 % topical ointment    morphine 2 mg/mL injection    metoprolol (LOPRESSOR) 1 mg/mL injection       Impression:   Clinical Impression   Chest pain, unspecified type (Primary)   ESRD on dialysis (CMS HCC)   Hypertension, unspecified type       Disposition: Admitted          Portions of this note may have been dictated using voice recognition software.     Cruzita Lederer, MD  [facility] ED    -----------------------  Results for orders placed or performed during the hospital encounter of 09/28/21 (from the past 12 hour(s))   COVID-19, FLU A/B, RSV RAPID BY PCR   Result Value Ref Range    SARS-CoV-2 Not Detected Not Detected    INFLUENZA VIRUS TYPE A Not Detected Not Detected    INFLUENZA VIRUS TYPE B Not Detected Not Detected    RESPIRATORY SYNCTIAL VIRUS (RSV) Not Detected Not Detected   COMPREHENSIVE METABOLIC PANEL, NON-FASTING   Result Value Ref Range    SODIUM 136 136 - 145 mmol/L    POTASSIUM 4.5 3.5 - 5.1 mmol/L    CHLORIDE 102 98 - 107 mmol/L  CO2 TOTAL 22 21 - 31 mmol/L    ANION GAP 12 10 - 20 mmol/L    BUN 26 (H) 7 - 25 mg/dL    CREATININE 9.05 (H) 0.60 - 1.30 mg/dL    BUN/CREA RATIO 3 (L) 6 - 22    ESTIMATED GFR 5 (L) >59 mL/min/1.9m^2    ALBUMIN 4.3 3.5 - 5.7 g/dL    CALCIUM 10.5 (H) 8.6 - 10.3 mg/dL    GLUCOSE 83 74 - 109 mg/dL    ALKALINE PHOSPHATASE 84 34 - 104 U/L    ALT (SGPT) <7 (L) 7 - 52 U/L    AST (SGOT) 13  13 - 39 U/L    BILIRUBIN TOTAL 0.9 0.3 - 1.2 mg/dL    PROTEIN TOTAL 7.7 6.4 - 8.9 g/dL    ALBUMIN/GLOBULIN RATIO 1.3 0.8 - 1.4    OSMOLALITY, CALCULATED 276 270 - 290 mOsm/kg    CALCIUM, CORRECTED 10.2 8.9 - 10.8 mg/dL    GLOBULIN 3.4 2.9 - 5.4   TROPONIN-I NOW   Result Value Ref Range    TROPONIN I 60 (H) <15 ng/L   CBC WITH DIFF   Result Value Ref Range    WBCS UNCORRECTED 10.7 x10^3/uL    WBC 10.7 (H) 4.0 - 10.5 x10^3/uL    RBC 2.80 (L) 4.20 - 5.40 x10^6/uL    HGB 8.3 (L) 12.5 - 16.0 g/dL    HCT 25.2 (L) 37.0 - 47.0 %    MCV 90.1 78.0 - 99.0 fL    MCH 29.8 27.0 - 32.0 pg    MCHC 33.1 32.0 - 36.0 g/dL    RDW 20.1 (H) 11.6 - 14.8 %    PLATELETS 184 140 - 440 x10^3/uL    MPV 6.6 (L) 7.4 - 10.4 fL    NEUTROPHIL % 79 (H) 40 - 76 %    LYMPHOCYTE % 9 (L) 25 - 45 %    MONOCYTE % 8 0 - 12 %    EOSINOPHIL % 3 0 - 7 %    BASOPHIL % 1 0 - 3 %    NEUTROPHIL # 8.50 (H) 1.80 - 8.40 x10^3/uL    LYMPHOCYTE # 0.90 (L) 1.10 - 5.00 x10^3/uL    MONOCYTE # 0.80 0.00 - 1.30 x10^3/uL    EOSINOPHIL # 0.40 0.00 - 0.80 x10^3/uL    BASOPHIL # 0.10 0.00 - 0.30 x10^3/uL   TROPONIN-I IN ONE HOUR   Result Value Ref Range    TROPONIN I 69 (H) <15 ng/L     XR AP MOBILE CHEST   Final Result   Improved airspace disease bilaterally.         Radiologist location ID: [id number]

## 2021-09-28 NOTE — ED Triage Notes (Signed)
Brought to ED via [facility] with c/o chest pain, vomiting, and diarrhea since this am. Pt blood pressure is elevated 207/141. PTA via EMS pt received: 18g IV to left forearm, 324mg  ASA, NTG X3, 2mg  Morphine x3. Pt reports pain was initially 10/10 and is now 6/10.

## 2021-09-29 ENCOUNTER — Encounter (HOSPITAL_COMMUNITY): Payer: Self-pay | Admitting: Internal Medicine

## 2021-09-29 DIAGNOSIS — J811 Chronic pulmonary edema: Secondary | ICD-10-CM

## 2021-09-29 DIAGNOSIS — N186 End stage renal disease: Secondary | ICD-10-CM | POA: Diagnosis present

## 2021-09-29 DIAGNOSIS — G8929 Other chronic pain: Secondary | ICD-10-CM

## 2021-09-29 DIAGNOSIS — I120 Hypertensive chronic kidney disease with stage 5 chronic kidney disease or end stage renal disease: Secondary | ICD-10-CM

## 2021-09-29 DIAGNOSIS — I252 Old myocardial infarction: Secondary | ICD-10-CM

## 2021-09-29 DIAGNOSIS — I5032 Chronic diastolic (congestive) heart failure: Secondary | ICD-10-CM

## 2021-09-29 DIAGNOSIS — K219 Gastro-esophageal reflux disease without esophagitis: Secondary | ICD-10-CM

## 2021-09-29 DIAGNOSIS — D631 Anemia in chronic kidney disease: Secondary | ICD-10-CM

## 2021-09-29 DIAGNOSIS — I517 Cardiomegaly: Secondary | ICD-10-CM

## 2021-09-29 DIAGNOSIS — J45909 Unspecified asthma, uncomplicated: Secondary | ICD-10-CM

## 2021-09-29 DIAGNOSIS — I160 Hypertensive urgency: Secondary | ICD-10-CM

## 2021-09-29 LAB — CBC WITH DIFF
BASOPHIL #: 0.1 10*3/uL (ref 0.00–0.30)
BASOPHIL %: 2 % (ref 0–3)
EOSINOPHIL #: 0.3 10*3/uL (ref 0.00–0.80)
EOSINOPHIL %: 4 % (ref 0–7)
HCT: 24.2 % — ABNORMAL LOW (ref 37.0–47.0)
HGB: 7.8 g/dL — ABNORMAL LOW (ref 12.5–16.0)
LYMPHOCYTE #: 1.3 10*3/uL (ref 1.10–5.00)
LYMPHOCYTE %: 14 % — ABNORMAL LOW (ref 25–45)
MCH: 29.5 pg (ref 27.0–32.0)
MCHC: 32.3 g/dL (ref 32.0–36.0)
MCV: 91.1 fL (ref 78.0–99.0)
MONOCYTE #: 0.8 10*3/uL (ref 0.00–1.30)
MONOCYTE %: 9 % (ref 0–12)
MPV: 6.6 fL — ABNORMAL LOW (ref 7.4–10.4)
NEUTROPHIL #: 6.4 10*3/uL (ref 1.80–8.40)
NEUTROPHIL %: 72 % (ref 40–76)
PLATELETS: 164 10*3/uL (ref 140–440)
RBC: 2.66 10*6/uL — ABNORMAL LOW (ref 4.20–5.40)
RDW: 20.3 % — ABNORMAL HIGH (ref 11.6–14.8)
WBC: 8.9 10*3/uL (ref 4.0–10.5)
WBCS UNCORRECTED: 8.9 10*3/uL

## 2021-09-29 LAB — ECG 12 LEAD
Atrial Rate: 86 {beats}/min
Calculated P Axis: 60 degrees
Calculated R Axis: 9 degrees
Calculated T Axis: 67 degrees
PR Interval: 218 ms
QRS Duration: 102 ms
QT Interval: 406 ms
QTC Calculation: 485 ms
Ventricular rate: 86 {beats}/min

## 2021-09-29 LAB — BASIC METABOLIC PANEL
ANION GAP: 13 mmol/L (ref 10–20)
BUN/CREA RATIO: 3 — ABNORMAL LOW (ref 6–22)
BUN: 31 mg/dL — ABNORMAL HIGH (ref 7–25)
CALCIUM: 10.4 mg/dL — ABNORMAL HIGH (ref 8.6–10.3)
CHLORIDE: 104 mmol/L (ref 98–107)
CO2 TOTAL: 17 mmol/L — ABNORMAL LOW (ref 21–31)
CREATININE: 9.91 mg/dL — ABNORMAL HIGH (ref 0.60–1.30)
ESTIMATED GFR: 4 mL/min/{1.73_m2} — ABNORMAL LOW (ref >59)
GLUCOSE: 72 mg/dL — ABNORMAL LOW (ref 74–109)
OSMOLALITY, CALCULATED: 273 mOsm/kg (ref 270–290)
POTASSIUM: 4.6 mmol/L (ref 3.5–5.1)
SODIUM: 134 mmol/L — ABNORMAL LOW (ref 136–145)

## 2021-09-29 LAB — TROPONIN-I
TROPONIN I: 78 ng/L — ABNORMAL HIGH (ref <15)
TROPONIN I: 79 ng/L — ABNORMAL HIGH (ref <15)

## 2021-09-29 LAB — MAGNESIUM: MAGNESIUM: 2.3 mg/dL (ref 1.9–2.7)

## 2021-09-29 LAB — BLUE TOP TUBE

## 2021-09-29 MED ORDER — POLYETHYLENE GLYCOL 3350 17 GRAM ORAL POWDER PACKET
17.0000 g | Freq: Two times a day (BID) | ORAL | Status: DC | PRN
Start: 2021-09-29 — End: 2021-09-29

## 2021-09-29 MED ORDER — LACTULOSE 10 GRAM/15 ML (15 ML) ORAL SOLUTION
30.0000 mL | Freq: Every day | ORAL | Status: DC | PRN
Start: 2021-09-29 — End: 2021-09-29

## 2021-09-29 MED ORDER — DILTIAZEM CD 180 MG CAPSULE,EXTENDED RELEASE 24 HR
180.0000 mg | ORAL_CAPSULE | Freq: Every day | ORAL | Status: DC
Start: 2021-09-29 — End: 2021-09-29
  Administered 2021-09-29: 180 mg via ORAL
  Filled 2021-09-29 (×3): qty 1

## 2021-09-29 MED ORDER — CLONIDINE HCL 0.1 MG TABLET
ORAL_TABLET | ORAL | Status: AC
Start: 2021-09-29 — End: 2021-09-29
  Filled 2021-09-29: qty 2

## 2021-09-29 MED ORDER — MONTELUKAST 10 MG TABLET
10.0000 mg | ORAL_TABLET | Freq: Every evening | ORAL | Status: DC
Start: 2021-09-29 — End: 2021-09-29
  Administered 2021-09-29: 10 mg via ORAL

## 2021-09-29 MED ORDER — ISOSORBIDE MONONITRATE ER 60 MG TABLET,EXTENDED RELEASE 24 HR
120.0000 mg | ORAL_TABLET | Freq: Every morning | ORAL | Status: DC
Start: 2021-09-29 — End: 2021-09-29
  Administered 2021-09-29: 120 mg via ORAL

## 2021-09-29 MED ORDER — CLONIDINE 0.2 MG/24 HR WEEKLY TRANSDERMAL PATCH
0.3000 mg | MEDICATED_PATCH | TRANSDERMAL | Status: DC
Start: 2021-10-05 — End: 2021-09-29

## 2021-09-29 MED ORDER — PANTOPRAZOLE 40 MG TABLET,DELAYED RELEASE
DELAYED_RELEASE_TABLET | ORAL | Status: AC
Start: 2021-09-29 — End: 2021-09-29
  Filled 2021-09-29: qty 1

## 2021-09-29 MED ORDER — ACETAMINOPHEN 325 MG TABLET
650.0000 mg | ORAL_TABLET | Freq: Four times a day (QID) | ORAL | Status: DC | PRN
Start: 2021-09-29 — End: 2021-09-29

## 2021-09-29 MED ORDER — MONTELUKAST 10 MG TABLET
ORAL_TABLET | ORAL | Status: AC
Start: 2021-09-29 — End: 2021-09-29
  Filled 2021-09-29: qty 1

## 2021-09-29 MED ORDER — CLONIDINE HCL 0.1 MG TABLET
0.2000 mg | ORAL_TABLET | Freq: Two times a day (BID) | ORAL | Status: AC
Start: 2021-09-29 — End: 2021-09-29
  Administered 2021-09-29 (×2): 0.2 mg via ORAL

## 2021-09-29 MED ORDER — LORATADINE 10 MG TABLET
ORAL_TABLET | ORAL | Status: AC
Start: 2021-09-29 — End: 2021-09-29
  Filled 2021-09-29: qty 1

## 2021-09-29 MED ORDER — PANTOPRAZOLE 40 MG TABLET,DELAYED RELEASE
40.0000 mg | DELAYED_RELEASE_TABLET | Freq: Every day | ORAL | Status: DC
Start: 2021-09-29 — End: 2021-09-29
  Administered 2021-09-29: 40 mg via ORAL

## 2021-09-29 MED ORDER — DOXAZOSIN 4 MG TABLET
4.0000 mg | ORAL_TABLET | Freq: Every evening | ORAL | Status: DC
Start: 2021-09-29 — End: 2021-09-29
  Administered 2021-09-29: 4 mg via ORAL
  Filled 2021-09-29 (×2): qty 1

## 2021-09-29 MED ORDER — LABETALOL 200 MG TABLET
300.0000 mg | ORAL_TABLET | Freq: Two times a day (BID) | ORAL | Status: DC
Start: 2021-09-29 — End: 2021-09-29
  Administered 2021-09-29: 300 mg via ORAL
  Filled 2021-09-29 (×4): qty 1.5

## 2021-09-29 MED ORDER — SODIUM CHLORIDE 0.9 % (FLUSH) INJECTION SYRINGE
3.0000 mL | INJECTION | INTRAMUSCULAR | Status: DC | PRN
Start: 2021-09-29 — End: 2021-09-29

## 2021-09-29 MED ORDER — CALCITRIOL 0.25 MCG CAPSULE
1.0000 ug | ORAL_CAPSULE | ORAL | Status: DC
Start: 2021-09-30 — End: 2021-09-29
  Filled 2021-09-29: qty 4

## 2021-09-29 MED ORDER — SODIUM BICARBONATE 8.4 % (1 MEQ/ML) INTRAVENOUS SYRINGE
50.0000 meq | INJECTION | Freq: Once | INTRAVENOUS | Status: DC
Start: 2021-09-29 — End: 2021-09-29

## 2021-09-29 MED ORDER — GUAIFENESIN 100 MG/5 ML ORAL LIQUID
200.0000 mg | Freq: Four times a day (QID) | ORAL | Status: DC | PRN
Start: 2021-09-29 — End: 2021-09-29

## 2021-09-29 MED ORDER — LORATADINE 10 MG TABLET
10.0000 mg | ORAL_TABLET | Freq: Every day | ORAL | Status: DC
Start: 2021-09-29 — End: 2021-09-29
  Administered 2021-09-29: 10 mg via ORAL

## 2021-09-29 MED ORDER — ISOSORBIDE MONONITRATE ER 60 MG TABLET,EXTENDED RELEASE 24 HR
ORAL_TABLET | ORAL | Status: AC
Start: 2021-09-29 — End: 2021-09-29
  Filled 2021-09-29: qty 2

## 2021-09-29 MED ORDER — OXYCODONE 5 MG TABLET
5.0000 mg | ORAL_TABLET | ORAL | Status: DC | PRN
Start: 2021-09-29 — End: 2021-09-29

## 2021-09-29 MED ORDER — APIXABAN 5 MG TABLET
ORAL_TABLET | ORAL | Status: AC
Start: 2021-09-29 — End: 2021-09-29
  Filled 2021-09-29: qty 1

## 2021-09-29 MED ORDER — SODIUM CHLORIDE 0.9 % (FLUSH) INJECTION SYRINGE
3.0000 mL | INJECTION | Freq: Three times a day (TID) | INTRAMUSCULAR | Status: DC
Start: 2021-09-29 — End: 2021-09-29
  Administered 2021-09-29: 3 mL

## 2021-09-29 MED ORDER — CINACALCET 30 MG TABLET
30.0000 mg | ORAL_TABLET | Freq: Every evening | ORAL | Status: DC
Start: 2021-09-30 — End: 2021-09-29

## 2021-09-29 MED ORDER — APIXABAN 5 MG TABLET
2.5000 mg | ORAL_TABLET | Freq: Two times a day (BID) | ORAL | Status: DC
Start: 2021-09-29 — End: 2021-09-29
  Administered 2021-09-29: 2.5 mg via ORAL

## 2021-09-29 NOTE — Nurses Notes (Signed)
Upon arrival to floor, pt states "i am not staying, give me the AMA form." "I told the nurse in the ER I was not staying." Explained the risks and dangers of leaving AMA and pt refuses to stay. AMA form signed.  Family member here to transport pt home.

## 2021-09-29 NOTE — Nurses Notes (Signed)
Went in to assess patient, reports she is wanting to stay. Patient reports she is leaving AMA. AMA form signed and with Camera operator. Patient is Ax4. Risk explained to patient. Patient is still refusing to stay. Patients reports her BP is always elevated and she cannot stay because she has a heart cath scheduled on Tuesday.

## 2021-09-29 NOTE — ED Nurses Note (Signed)
In to see patient at this time for med round, patient was immediately upset and said "I don't know why they are keeping me, this hospital fucking sucks and no one around here knows how to take care of me." I explained to the patient why she was being admitted and assured her that I would take care of her to the best of my ability. Patient stated "I'm going to just get up and leave, I have been waiting down here all night for nothing." I advised that she could leave but she would have to sign out AMA and that her blood pressure was very high. I informed the patient why she was being admitted and the patient continued to be argumentative and talk about how bad this hospital was. Morning meds were given, patient assisted to the bedside commode, no other needs stated at this time.

## 2021-09-29 NOTE — Nurses Notes (Signed)
Patients ride arrived to hospital, patients IV site removed. Telemetry unit removed.Reports that she will follow up with PCP and attend dialysis outpatient at clinic tomorrow. Patient continues to refuse admitting services at hospital even though BP continues to remain high. Provider already aware. Patient pushed out in wheelchair.

## 2021-09-29 NOTE — H&P (Signed)
[facility]  History and Physical    Date of Service:  09/29/2021  [name], [name], 46 y.o. Malone  Encounter Start Date:  09/28/2021  Inpatient Admission Date:   Date of Birth:  Apr 10, 1975  PCP: Cam Hai, FNP       Chief Complaint:  chest pain    HPI: [name] is a 46 y.o., 32 American Malone who presented to the emergency department today with a chief complaint of chest pain.  Patient was seen examined at bedside in the emergency department following discussion with the ER provider.  Patient is well-known to the hospitalist service.  Patient tells me she has not had any of her evening medications today.  Reports she has chest pain, she is known to have chest pain chronically.  She also has uncontrolled hypertension.  Patient does have end-stage renal disease, receives hemodialysis 3 times per week, so she last had hemodialysis on Friday.  Patient denies any nausea or vomiting today.    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    Tricuspid valve regurgitation     Uncontrolled hypertension              Medications Prior to Admission       Prescriptions    acetaminophen (TYLENOL) 325 mg Oral Tablet    Take 2 Tablets (650 mg total) by mouth Every 4 hours as needed    albuterol sulfate (PROVENTIL OR VENTOLIN OR PROAIR) 90 mcg/actuation Inhalation oral inhaler    Take 1-2 Puffs by inhalation Every 6 hours as needed    apixaban (ELIQUIS) 2.5 mg Oral Tablet    Take 1 Tablet (2.5 mg total) by mouth Twice daily for 41 doses     budesonide-formoteroL (SYMBICORT) 160-4.5 mcg/actuation Inhalation oral inhaler    Take 2 Puffs by inhalation Twice daily    calcitrioL (ROCALTROL) 0.5 mcg Oral Capsule    Take 2 Capsules (1 mcg total) by mouth Every Monday, Wednesday and Friday    cinacalcet (SENSIPAR) 30 mg Oral Tablet    Take 1 Tablet (30 mg total) by mouth Every evening    cloNIDine (CATAPRES-TTS) 0.3 mg/24 hr Transdermal Patch Weekly    Place 1 Patch (0.3 mg total) on the skin Every 7 days    cloNIDine HCL (CATAPRES) 0.2 mg Oral Tablet    Take 1 Tablet (0.2 mg total) by mouth Twice daily for 30 days    clopidogreL (PLAVIX) 75 mg Oral Tablet    Take 1 Tablet (75 mg total) by mouth Once a day    cyclobenzaprine (FLEXERIL) 5 mg Oral Tablet    Take 1 Tablet (5 mg total) by mouth Twice per day as needed for Muscle spasms    dilTIAZem (CARDIZEM CD) 180 mg Oral Capsule, Sust. Release 24 hr    Take 1 Capsule (180 mg total) by mouth Once a day for  30 days    doxazosin (CARDURA) 4 mg Oral Tablet    Take 1 Tablet (4 mg total) by mouth Every evening    doxycycline hyclate (VIBRAMYCIN) 100 mg Oral Capsule    Take 1 Capsule (100 mg total) by mouth Twice daily for 5 days    ferric citrate (AURYXIA ORAL)    Take 1 g by mouth Three times daily before meals "Take 2 tablets three times daily before meals and 1 tablet before snacks"    furosemide (LASIX) 80 mg Oral Tablet    Take 2 Tablets (160 mg total) by mouth Once a day Take after dialysis on dialysis days; otherwise take in morning daily.    guaiFENesin (MUCINEX) 600 mg Oral Tablet Extended Release 12hr    Take 1 Tablet (600 mg total) by mouth Every 12 hours    isosorbide mononitrate (IMDUR) 120 mg Oral Tablet Sustained Release 24 hr    Take 1 Tablet (120 mg total) by mouth Every morning for 30 days    labetaloL (NORMODYNE) 300 mg Oral Tablet    Take 1 Tablet (300 mg total) by mouth Every 12 hours for 30 days    loratadine (CLARITIN) 10 mg Oral Tablet    Take 1 Tablet (10 mg total) by mouth Once a day     montelukast (SINGULAIR) 10 mg Oral Tablet    Take 1 Tablet (10 mg total) by mouth Every evening    nitroGLYCERIN (NITROSTAT) 0.4 mg Sublingual Tablet, Sublingual    Place 1 Tablet (0.4 mg total) under the tongue Every 5 minutes for 3 doses for 3 doses over 15 minutes    ondansetron (ZOFRAN) 4 mg Oral Tablet    Take 1 Tablet (4 mg total) by mouth Every 12 hours as needed for Nausea/Vomiting    pantoprazole (PROTONIX) 40 mg Oral Tablet, Delayed Release (E.C.)    Take 1 Tablet (40 mg total) by mouth Once a day    vancomycin 1,500 mg in NS 15 mL infusion    1,500 mg Give in Dialysis Mix and infuse per policy of [facility].          Allergies   Allergen Reactions    Lisinopril Swelling     Tongue and throat swelling       Past Surgical History:  Past Surgical History:   Procedure Laterality Date    ESOPHAGOGASTRODUODENOSCOPY      HX BACK SURGERY      HX CHOLECYSTECTOMY      HX FOOT SURGERY Right     HX HYSTERECTOMY      HX TONSILLECTOMY             Family History:  Family Medical History:       Problem Relation (Age of Onset)    Breast Cancer Mother    Coronary Artery Disease Mother    Diabetes type II Mother    Hypertension (High Blood Pressure) Father               Social History:  Social History     Tobacco Use    Smoking status: Every Day     Packs/day: 1.00     Years: 10.00     Pack years: 10.00     Types: Cigarettes    Smokeless tobacco: Former    Tobacco comments:     "Haven't had a cigerette in 2 weeks."   Vaping Use    Vaping Use: Never used   Substance Use Topics  Alcohol use: Not Currently    Drug use: Yes     Types: Marijuana        Review of Systems:  All systems are reviewed and are negative except those mentioned in the HPI portion    Examination:  BP (!) 186/143   Pulse 94   Temp 36.8 C (98.2 F)   Resp 14   Ht 1.702 m (5\' 7" )   Wt 86.2 kg (190 lb)   SpO2 95%   BMI 29.76 kg/m         General: Patient is alert and oriented to person, place and time.    HEENT: Pupils are of round  shape, equal in size, and reactive to light bilaterally.    Heart: S1 and S2 are present.    Lungs: Breath sounds are appreciated at all posterior lung fields, some appreciable crackles at bilateral bases.  No appreciable wheezing or rhonchi.    Gastrointestinal: Bowel sounds are appreciated at all 4 quadrants. Abdomen is soft, not appreciably distended, non-tender to palpation at all quadrants.    Extremities: Radial pulses are 3/4 bilaterally, dorsalis pedis pulses are 3/4 bilaterally. Capillary refill is less than 3 seconds at distal digits bilaterally.    Genitourinary: No appreciable suprapubic tenderness.    Neurologic: Follows commands appropriately. No appreciable facial droop.     Skin: Grossly intact at observable areas.    Labs:    Lab Results Today:    Results for orders placed or performed during the hospital encounter of 09/28/21 (from the past 24 hour(s))   ECG 12 LEAD   Result Value Ref Range    Ventricular rate 86 BPM    Atrial Rate 86 BPM    PR Interval 218 ms    QRS Duration 102 ms    QT Interval 406 ms    QTC Calculation 485 ms    Calculated P Axis 60 degrees    Calculated R Axis 9 degrees    Calculated T Axis 67 degrees   COVID-19, FLU A/B, RSV RAPID BY PCR   Result Value Ref Range    SARS-CoV-2 Not Detected Not Detected    INFLUENZA VIRUS TYPE A Not Detected Not Detected    INFLUENZA VIRUS TYPE B Not Detected Not Detected    RESPIRATORY SYNCTIAL VIRUS (RSV) Not Detected Not Detected   COMPREHENSIVE METABOLIC PANEL, NON-FASTING   Result Value Ref Range    SODIUM 136 136 - 145 mmol/L    POTASSIUM 4.5 3.5 - 5.1 mmol/L    CHLORIDE 102 98 - 107 mmol/L    CO2 TOTAL 22 21 - 31 mmol/L    ANION GAP 12 10 - 20 mmol/L    BUN 26 (H) 7 - 25 mg/dL    CREATININE 9.05 (H) 0.60 - 1.30 mg/dL    BUN/CREA RATIO 3 (L) 6 - 22    ESTIMATED GFR 5 (L) >59 mL/min/1.30m^2    ALBUMIN 4.3 3.5 - 5.7 g/dL    CALCIUM 10.5 (H) 8.6 - 10.3 mg/dL    GLUCOSE 83 74 - 109 mg/dL    ALKALINE PHOSPHATASE 84 34 - 104 U/L    ALT (SGPT)  <7 (L) 7 - 52 U/L    AST (SGOT) 13 13 - 39 U/L    BILIRUBIN TOTAL 0.9 0.3 - 1.2 mg/dL    PROTEIN TOTAL 7.7 6.4 - 8.9 g/dL    ALBUMIN/GLOBULIN RATIO 1.3 0.8 - 1.4    OSMOLALITY, CALCULATED 276 270 - 290 mOsm/kg  CALCIUM, CORRECTED 10.2 8.9 - 10.8 mg/dL    GLOBULIN 3.4 2.9 - 5.4   TROPONIN-I NOW   Result Value Ref Range    TROPONIN I 60 (H) <15 ng/L   CBC WITH DIFF   Result Value Ref Range    WBCS UNCORRECTED 10.7 x10^3/uL    WBC 10.7 (H) 4.0 - 10.5 x10^3/uL    RBC 2.80 (L) 4.20 - 5.40 x10^6/uL    HGB 8.3 (L) 12.5 - 16.0 g/dL    HCT 25.2 (L) 37.0 - 47.0 %    MCV 90.1 78.0 - 99.0 fL    MCH 29.8 27.0 - 32.0 pg    MCHC Dawn.1 32.0 - 36.0 g/dL    RDW 20.1 (H) 11.6 - 14.8 %    PLATELETS 184 140 - 440 x10^3/uL    MPV 6.6 (L) 7.4 - 10.4 fL    NEUTROPHIL % 79 (H) 40 - 76 %    LYMPHOCYTE % 9 (L) 25 - 45 %    MONOCYTE % 8 0 - 12 %    EOSINOPHIL % 3 0 - 7 %    BASOPHIL % 1 0 - 3 %    NEUTROPHIL # 8.50 (H) 1.80 - 8.40 x10^3/uL    LYMPHOCYTE # 0.90 (L) 1.10 - 5.00 x10^3/uL    MONOCYTE # 0.80 0.00 - 1.30 x10^3/uL    EOSINOPHIL # 0.40 0.00 - 0.80 x10^3/uL    BASOPHIL # 0.10 0.00 - 0.30 x10^3/uL   BLUE TOP TUBE   Result Value Ref Range    RAINBOW/EXTRA TUBE AUTO RESULT Yes    TROPONIN-I IN ONE HOUR   Result Value Ref Range    TROPONIN I 69 (H) <15 ng/L   TROPONIN-I IN THREE HOOURS   Result Value Ref Range    TROPONIN I 78 (H) <15 ng/L       Imaging Studies:  Results for orders placed or performed during the hospital encounter of 09/28/21 (from the past 24 hour(s))   XR AP MOBILE CHEST     Status: None    Narrative    [name]    RADIOLOGIST: Tempie Donning    XR AP MOBILE CHEST performed on 09/28/2021 9:14 PM    CLINICAL HISTORY: shortness of breath/chest pain.  shortness of breath/chest pain    TECHNIQUE: Frontal view of the chest.    COMPARISON:  08/28/2021    FINDINGS:  Right-sided dialysis catheter projects over the SVC. A vascular stent graft is seen.   Cardiac and mediastinal contours are stable.   Chronic-appearing changes  of both lungs. Patchy bilateral airspace opacities appear improved most notably in the right lower lung.          Impression    Improved airspace disease bilaterally.      Radiologist location ID: [id number]          Assessment/Plan:   Active Hospital Problems    Diagnosis    Hypertensive urgency    ESRD (end stage renal disease) (CMS HCC)    LVH (left ventricular hypertrophy)    Elevated troponin I level    Chronic chest pain    Uncontrolled hypertension     Patient is presenting with hypertensive urgency, she does have chronic chest pain, may be exacerbated by her hypertension, she does have uncontrolled hypertension she also has severe left ventricular hypertrophy is found by her previous echocardiogram.  Patient reports she is had none of her evening medications, we will go ahead and resume these and 1st  noticed those will be now especially her antihypertensive agents.  Patient also has mildly elevated troponin I level, she does have this chronically which is likely multifactorial given her ESRD as well as hypertensive heart disease.  She will have frequent blood pressure checks, we will consult Nephrology Service as well.  Further changes to her plan of care dependent upon her clinical course.    Medical decision-making for today's visit is of low level.    DVT/PE Prophylaxis: Eliquis    Dayton Bailiff, DO    Contents of the document, in whole or in part, are completed utilizing M*Modal dictation technology, please forgive any typographical errors that may exist.

## 2021-09-29 NOTE — ED Nurses Note (Signed)
Report called to 3 east.

## 2021-09-30 ENCOUNTER — Inpatient Hospital Stay (HOSPITAL_COMMUNITY): Payer: Commercial Managed Care - PPO | Admitting: Internal Medicine

## 2021-09-30 ENCOUNTER — Other Ambulatory Visit: Payer: Self-pay

## 2021-09-30 ENCOUNTER — Emergency Department (HOSPITAL_COMMUNITY): Payer: Commercial Managed Care - PPO

## 2021-09-30 ENCOUNTER — Encounter (INDEPENDENT_AMBULATORY_CARE_PROVIDER_SITE_OTHER): Payer: Self-pay | Admitting: Student in an Organized Health Care Education/Training Program

## 2021-09-30 ENCOUNTER — Inpatient Hospital Stay
Admission: EM | Admit: 2021-09-30 | Discharge: 2021-10-03 | DRG: 286 | Disposition: A | Discharge status: Other Acute Inpatient Hospital | Payer: Commercial Managed Care - PPO | Attending: Critical Care Medicine | Admitting: Critical Care Medicine

## 2021-09-30 ENCOUNTER — Encounter (HOSPITAL_COMMUNITY): Payer: Self-pay

## 2021-09-30 DIAGNOSIS — I132 Hypertensive heart and chronic kidney disease with heart failure and with stage 5 chronic kidney disease, or end stage renal disease: Secondary | ICD-10-CM | POA: Diagnosis present

## 2021-09-30 DIAGNOSIS — G8929 Other chronic pain: Secondary | ICD-10-CM | POA: Diagnosis present

## 2021-09-30 DIAGNOSIS — F17210 Nicotine dependence, cigarettes, uncomplicated: Secondary | ICD-10-CM | POA: Diagnosis present

## 2021-09-30 DIAGNOSIS — N189 Chronic kidney disease, unspecified: Secondary | ICD-10-CM | POA: Diagnosis present

## 2021-09-30 DIAGNOSIS — I252 Old myocardial infarction: Secondary | ICD-10-CM

## 2021-09-30 DIAGNOSIS — R0602 Shortness of breath: Secondary | ICD-10-CM

## 2021-09-30 DIAGNOSIS — I2582 Chronic total occlusion of coronary artery: Secondary | ICD-10-CM | POA: Diagnosis present

## 2021-09-30 DIAGNOSIS — Z7901 Long term (current) use of anticoagulants: Secondary | ICD-10-CM

## 2021-09-30 DIAGNOSIS — N2581 Secondary hyperparathyroidism of renal origin: Secondary | ICD-10-CM | POA: Diagnosis present

## 2021-09-30 DIAGNOSIS — D631 Anemia in chronic kidney disease: Secondary | ICD-10-CM | POA: Diagnosis present

## 2021-09-30 DIAGNOSIS — Z79899 Other long term (current) drug therapy: Secondary | ICD-10-CM

## 2021-09-30 DIAGNOSIS — I160 Hypertensive urgency: Principal | ICD-10-CM | POA: Diagnosis present

## 2021-09-30 DIAGNOSIS — I509 Heart failure, unspecified: Secondary | ICD-10-CM | POA: Diagnosis present

## 2021-09-30 DIAGNOSIS — R079 Chest pain, unspecified: Secondary | ICD-10-CM

## 2021-09-30 DIAGNOSIS — R778 Other specified abnormalities of plasma proteins: Secondary | ICD-10-CM | POA: Diagnosis present

## 2021-09-30 DIAGNOSIS — I5032 Chronic diastolic (congestive) heart failure: Secondary | ICD-10-CM | POA: Diagnosis present

## 2021-09-30 DIAGNOSIS — Z9981 Dependence on supplemental oxygen: Secondary | ICD-10-CM

## 2021-09-30 DIAGNOSIS — I517 Cardiomegaly: Secondary | ICD-10-CM | POA: Diagnosis present

## 2021-09-30 DIAGNOSIS — E785 Hyperlipidemia, unspecified: Secondary | ICD-10-CM | POA: Diagnosis present

## 2021-09-30 DIAGNOSIS — D649 Anemia, unspecified: Secondary | ICD-10-CM | POA: Diagnosis present

## 2021-09-30 DIAGNOSIS — Z86718 Personal history of other venous thrombosis and embolism: Secondary | ICD-10-CM

## 2021-09-30 DIAGNOSIS — E1122 Type 2 diabetes mellitus with diabetic chronic kidney disease: Secondary | ICD-10-CM | POA: Diagnosis present

## 2021-09-30 DIAGNOSIS — Z9049 Acquired absence of other specified parts of digestive tract: Secondary | ICD-10-CM

## 2021-09-30 DIAGNOSIS — R110 Nausea: Secondary | ICD-10-CM

## 2021-09-30 DIAGNOSIS — N186 End stage renal disease: Secondary | ICD-10-CM | POA: Diagnosis present

## 2021-09-30 DIAGNOSIS — I10 Essential (primary) hypertension: Secondary | ICD-10-CM | POA: Diagnosis present

## 2021-09-30 DIAGNOSIS — J449 Chronic obstructive pulmonary disease, unspecified: Secondary | ICD-10-CM | POA: Diagnosis present

## 2021-09-30 DIAGNOSIS — Z7951 Long term (current) use of inhaled steroids: Secondary | ICD-10-CM

## 2021-09-30 DIAGNOSIS — Z992 Dependence on renal dialysis: Secondary | ICD-10-CM

## 2021-09-30 DIAGNOSIS — I25110 Atherosclerotic heart disease of native coronary artery with unstable angina pectoris: Secondary | ICD-10-CM | POA: Diagnosis present

## 2021-09-30 DIAGNOSIS — Z7902 Long term (current) use of antithrombotics/antiplatelets: Secondary | ICD-10-CM

## 2021-09-30 DIAGNOSIS — Z90710 Acquired absence of both cervix and uterus: Secondary | ICD-10-CM

## 2021-09-30 LAB — COMPREHENSIVE METABOLIC PANEL, NON-FASTING
ALBUMIN/GLOBULIN RATIO: 1.1 (ref 0.8–1.4)
ALBUMIN: 4.3 g/dL (ref 3.5–5.7)
ALKALINE PHOSPHATASE: 75 U/L (ref 34–104)
ALT (SGPT): <7 U/L — ABNORMAL LOW (ref 7–52)
ANION GAP: 10 mmol/L (ref 10–20)
AST (SGOT): 13 U/L (ref 13–39)
BILIRUBIN TOTAL: 1 mg/dL (ref 0.3–1.2)
BUN/CREA RATIO: 3 — ABNORMAL LOW (ref 6–22)
BUN: 16 mg/dL (ref 7–25)
CALCIUM, CORRECTED: 9.9 mg/dL (ref 8.9–10.8)
CALCIUM: 10.2 mg/dL (ref 8.6–10.3)
CHLORIDE: 96 mmol/L — ABNORMAL LOW (ref 98–107)
CO2 TOTAL: 30 mmol/L (ref 21–31)
CREATININE: 5.06 mg/dL — ABNORMAL HIGH (ref 0.60–1.30)
ESTIMATED GFR: 10 mL/min/{1.73_m2} — ABNORMAL LOW (ref >59)
GLOBULIN: 4 (ref 2.9–5.4)
GLUCOSE: 103 mg/dL (ref 74–109)
OSMOLALITY, CALCULATED: 273 mOsm/kg (ref 270–290)
POTASSIUM: 3.3 mmol/L — ABNORMAL LOW (ref 3.5–5.1)
PROTEIN TOTAL: 8.3 g/dL (ref 6.4–8.9)
SODIUM: 136 mmol/L (ref 136–145)

## 2021-09-30 LAB — CBC WITH DIFF
BASOPHIL #: 0.2 10*3/uL (ref 0.00–0.30)
BASOPHIL %: 2 % (ref 0–3)
EOSINOPHIL #: 0.3 10*3/uL (ref 0.00–0.80)
EOSINOPHIL %: 4 % (ref 0–7)
HCT: 24.5 % — ABNORMAL LOW (ref 37.0–47.0)
HGB: 8.3 g/dL — ABNORMAL LOW (ref 12.5–16.0)
LYMPHOCYTE #: 0.9 10*3/uL — ABNORMAL LOW (ref 1.10–5.00)
LYMPHOCYTE %: 10 % — ABNORMAL LOW (ref 25–45)
MCH: 29.6 pg (ref 27.0–32.0)
MCHC: 33.7 g/dL (ref 32.0–36.0)
MCV: 87.8 fL (ref 78.0–99.0)
MONOCYTE #: 0.7 10*3/uL (ref 0.00–1.30)
MONOCYTE %: 8 % (ref 0–12)
MPV: 6.6 fL — ABNORMAL LOW (ref 7.4–10.4)
NEUTROPHIL #: 7.1 10*3/uL (ref 1.80–8.40)
NEUTROPHIL %: 77 % — ABNORMAL HIGH (ref 40–76)
PLATELETS: 192 10*3/uL (ref 140–440)
RBC: 2.79 10*6/uL — ABNORMAL LOW (ref 4.20–5.40)
RDW: 20.6 % — ABNORMAL HIGH (ref 11.6–14.8)
WBC: 9.2 10*3/uL (ref 4.0–10.5)
WBCS UNCORRECTED: 9.2 10*3/uL

## 2021-09-30 LAB — URINALYSIS, MACROSCOPIC
BILIRUBIN: NEGATIVE mg/dL
BLOOD: 0.03 mg/dL
GLUCOSE: NEGATIVE mg/dL
KETONES: NEGATIVE mg/dL
LEUKOCYTES: 500 WBCs/uL — AB
NITRITE: NEGATIVE
PH: 7 (ref 5.0–9.0)
PROTEIN: 50 mg/dL — AB
SPECIFIC GRAVITY: 1.007 (ref 1.002–1.030)
UROBILINOGEN: NORMAL mg/dL

## 2021-09-30 LAB — TROPONIN-I
TROPONIN I: 64 ng/L — ABNORMAL HIGH (ref <15)
TROPONIN I: 67 ng/L — ABNORMAL HIGH (ref <15)
TROPONIN I: 72 ng/L — ABNORMAL HIGH (ref <15)

## 2021-09-30 LAB — URINALYSIS, MICROSCOPIC
RBCS: 3 /hpf (ref <4)
SQUAMOUS EPITHELIAL: 2 /hpf (ref <28)
WBCS: 35 /hpf — ABNORMAL HIGH (ref <6)

## 2021-09-30 MED ORDER — NITROGLYCERIN 50 MG/250 ML (200 MCG/ML) IN 5 % DEXTROSE INTRAVENOUS
INTRAVENOUS | Status: AC
Start: 2021-09-30 — End: 2021-09-30
  Filled 2021-09-30: qty 250

## 2021-09-30 MED ORDER — NITROGLYCERIN 2 % TRANSDERMAL OINTMENT - PACKET
TOPICAL_OINTMENT | TRANSDERMAL | Status: AC
Start: 2021-09-30 — End: 2021-09-30
  Filled 2021-09-30: qty 1

## 2021-09-30 MED ORDER — ACETAMINOPHEN 325 MG TABLET
650.0000 mg | ORAL_TABLET | ORAL | Status: DC | PRN
Start: 2021-09-30 — End: 2021-10-03

## 2021-09-30 MED ORDER — OXYCODONE-ACETAMINOPHEN 5 MG-325 MG TABLET
ORAL_TABLET | ORAL | Status: AC
Start: 2021-09-30 — End: 2021-09-30
  Filled 2021-09-30: qty 1

## 2021-09-30 MED ORDER — OXYCODONE-ACETAMINOPHEN 5 MG-325 MG TABLET
1.0000 | ORAL_TABLET | Freq: Four times a day (QID) | ORAL | Status: DC | PRN
Start: 2021-09-30 — End: 2021-10-03
  Administered 2021-09-30: 1 via ORAL

## 2021-09-30 MED ORDER — MORPHINE 4 MG/ML INJECTION WRAPPER
INJECTION | INTRAMUSCULAR | Status: AC
Start: 2021-09-30 — End: 2021-09-30
  Filled 2021-09-30: qty 1

## 2021-09-30 MED ORDER — HYDRALAZINE 20 MG/ML INJECTION SOLUTION
20.0000 mg | INTRAMUSCULAR | Status: AC
Start: 2021-09-30 — End: 2021-09-30
  Administered 2021-09-30: 20 mg via INTRAVENOUS

## 2021-09-30 MED ORDER — MORPHINE 4 MG/ML INJECTION WRAPPER
4.0000 mg | INJECTION | INTRAMUSCULAR | Status: AC
Start: 2021-09-30 — End: 2021-09-30
  Administered 2021-09-30: 4 mg via INTRAVENOUS

## 2021-09-30 MED ORDER — ASPIRIN 81 MG CHEWABLE TABLET
324.0000 mg | CHEWABLE_TABLET | ORAL | Status: AC
Start: 2021-09-30 — End: 2021-09-30
  Administered 2021-09-30: 324 mg via ORAL

## 2021-09-30 MED ORDER — ASPIRIN 81 MG CHEWABLE TABLET
CHEWABLE_TABLET | ORAL | Status: AC
Start: 2021-09-30 — End: 2021-09-30
  Filled 2021-09-30: qty 4

## 2021-09-30 MED ORDER — NITROGLYCERIN 50 MG/250 ML (200 MCG/ML) IN 5 % DEXTROSE INTRAVENOUS
5.0000 ug/min | INTRAVENOUS | Status: DC
Start: 2021-10-01 — End: 2021-10-01
  Administered 2021-09-30: 5 ug/min via INTRAVENOUS
  Administered 2021-10-01: 0 ug/min via INTRAVENOUS
  Administered 2021-10-01: 15 ug/min via INTRAVENOUS
  Administered 2021-10-01: 45 ug/min via INTRAVENOUS
  Administered 2021-10-01: 40 ug/min via INTRAVENOUS
  Administered 2021-10-01: 55 ug/min via INTRAVENOUS
  Administered 2021-10-01: 25 ug/min via INTRAVENOUS
  Administered 2021-10-01: 60 ug/min via INTRAVENOUS
  Administered 2021-10-01: 50 ug/min via INTRAVENOUS
  Administered 2021-10-01: 10 ug/min via INTRAVENOUS
  Administered 2021-10-01: 30 ug/min via INTRAVENOUS
  Administered 2021-10-01: 35 ug/min via INTRAVENOUS
  Administered 2021-10-01: 20 ug/min via INTRAVENOUS

## 2021-09-30 MED ORDER — NITROGLYCERIN 2 % TRANSDERMAL OINTMENT - PACKET
1.0000 [in_us] | TOPICAL_OINTMENT | TRANSDERMAL | Status: AC
Start: 2021-09-30 — End: 2021-09-30
  Administered 2021-09-30: 1 [in_us] via TOPICAL

## 2021-09-30 MED ORDER — HYDRALAZINE 20 MG/ML INJECTION SOLUTION
INTRAMUSCULAR | Status: AC
Start: 2021-09-30 — End: 2021-09-30
  Filled 2021-09-30: qty 1

## 2021-09-30 NOTE — H&P (Incomplete)
[facility]  History and Physical    Date of Service:  09/30/21   [name], [name], 46 y.o. female  Encounter Start Date:  09/30/2021  Inpatient Admission Date: 09/30/2021  Date of Birth:  Mar 11, 1975  PCP: Cam Hai, FNP    Chief Complaint:  chest pain    HPI: [name] is a 46 y.o. 77 American female with history of chest pain all day today.  She is on dialysis and was admitted over the past weekend for chest pain and HTN. She signed out AMA yesterday because 'I was lying there and nobody was doing anything.'  She developed left chest pain this morning, states that it woke her up, present all day. She states that her HTN did not change with dialysis, which dialysis nurses told her was unusual for her. She is hypertensive now, she is chronically short of breath and is nauseated today. She did complete dialysis.  Patient had morphine and nitro paste in the ED. patient remains hypertensive.  Still having chest pain.  Complain of a headache.  Will start patient on nitro drip placed in the ICU setting.  Patient states he is due to have cardiac catheterization on August attempt with Dr. Viann Shove.     Past Medical History:    Past Medical History:   Diagnosis Date   . Asthma    . Chronic diastolic CHF (congestive heart failure) (CMS HCC)    . Dependence on supplemental oxygen    . Esophageal reflux    . ESRD (end stage renal disease) (CMS [place])    . History of anemia due to CKD    . MI (myocardial infarction) (CMS [place])    . Mitral valve regurgitation    . Pulmonary edema    . Sleep apnea    . 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 Tricuspid valve regurgitation    . Uncontrolled hypertension              Medications Prior to Admission       Prescriptions    acetaminophen (TYLENOL) 325 mg Oral Tablet    Take 2 Tablets (650 mg total) by mouth Every 4 hours as needed    albuterol sulfate (PROVENTIL OR VENTOLIN OR PROAIR) 90  mcg/actuation Inhalation oral inhaler    Take 1-2 Puffs by inhalation Every 6 hours as needed    apixaban (ELIQUIS) 2.5 mg Oral Tablet    Take 1 Tablet (2.5 mg total) by mouth Twice daily for 41 doses    budesonide-formoteroL (SYMBICORT) 160-4.5 mcg/actuation Inhalation oral inhaler    Take 2 Puffs by inhalation Twice daily    calcitrioL (ROCALTROL) 0.5 mcg Oral Capsule    Take 2 Capsules (1 mcg total) by mouth Every Monday, Wednesday and Friday    cinacalcet (SENSIPAR) 30 mg Oral Tablet    Take 1 Tablet (30 mg total) by mouth Every evening    cloNIDine (CATAPRES-TTS) 0.3 mg/24 hr Transdermal Patch Weekly    Place 1 Patch (0.3 mg total) on the skin Every 7 days    cloNIDine HCL (CATAPRES) 0.2 mg Oral Tablet    Take 1 Tablet (0.2 mg total) by mouth Twice daily for 30 days    clopidogreL (PLAVIX) 75 mg Oral Tablet    Take 1 Tablet (75 mg total) by mouth Once a day    cyclobenzaprine (FLEXERIL) 5 mg Oral Tablet    Take 1  Tablet (5 mg total) by mouth Twice per day as needed for Muscle spasms    dilTIAZem (CARDIZEM CD) 180 mg Oral Capsule, Sust. Release 24 hr    Take 1 Capsule (180 mg total) by mouth Once a day for 30 days    doxazosin (CARDURA) 4 mg Oral Tablet    Take 1 Tablet (4 mg total) by mouth Every evening    doxycycline hyclate (VIBRAMYCIN) 100 mg Oral Capsule    Take 1 Capsule (100 mg total) by mouth Twice daily for 5 days    ferric citrate (AURYXIA ORAL)    Take 1 g by mouth Three times daily before meals "Take 2 tablets three times daily before meals and 1 tablet before snacks"    furosemide (LASIX) 80 mg Oral Tablet    Take 2 Tablets (160 mg total) by mouth Once a day Take after dialysis on dialysis days; otherwise take in morning daily.    guaiFENesin (MUCINEX) 600 mg Oral Tablet Extended Release 12hr    Take 1 Tablet (600 mg total) by mouth Every 12 hours    isosorbide mononitrate (IMDUR) 120 mg Oral Tablet Sustained Release 24 hr    Take 1 Tablet (120 mg total) by mouth Every morning for 30 days     labetaloL (NORMODYNE) 300 mg Oral Tablet    Take 1 Tablet (300 mg total) by mouth Every 12 hours for 30 days    loratadine (CLARITIN) 10 mg Oral Tablet    Take 1 Tablet (10 mg total) by mouth Once a day    montelukast (SINGULAIR) 10 mg Oral Tablet    Take 1 Tablet (10 mg total) by mouth Every evening    nitroGLYCERIN (NITROSTAT) 0.4 mg Sublingual Tablet, Sublingual    Place 1 Tablet (0.4 mg total) under the tongue Every 5 minutes for 3 doses for 3 doses over 15 minutes    ondansetron (ZOFRAN) 4 mg Oral Tablet    Take 1 Tablet (4 mg total) by mouth Every 12 hours as needed for Nausea/Vomiting    pantoprazole (PROTONIX) 40 mg Oral Tablet, Delayed Release (E.C.)    Take 1 Tablet (40 mg total) by mouth Once a day    vancomycin 1,500 mg in NS 15 mL infusion    1,500 mg Give in Dialysis Mix and infuse per policy of [facility].          Allergies   Allergen Reactions   . Lisinopril Swelling     Tongue and throat swelling       Past Surgical History:  Past Surgical History:   Procedure Laterality Date   . ESOPHAGOGASTRODUODENOSCOPY     . HX BACK SURGERY     . HX CHOLECYSTECTOMY     . HX FOOT SURGERY Right    . HX HYSTERECTOMY     . HX TONSILLECTOMY             Family History:  Family Medical History:       Problem Relation (Age of Onset)    Breast Cancer Mother    Coronary Artery Disease Mother    Diabetes type II Mother    Hypertension (High Blood Pressure) Father               Social History:  Social History     Tobacco Use   . Smoking status: Every Day     Packs/day: 1.00     Years: 10.00     Pack years: 10.00  Types: Cigarettes   . Smokeless tobacco: Former   . Tobacco comments:     "Haven't had a cigerette in 2 weeks."   Vaping Use   . Vaping Use: Never used   Substance Use Topics   . Alcohol use: Not Currently   . Drug use: Yes     Types: Marijuana        Review of Systems:  General: No fever or chills. No weight changes, fatigue, weakness.   HEENT: No headaches, dizziness, changes in vision, changes in  hearing, or difficulty swallowing.    Skin:  No rashes, erythema or bruises.   Cardiac: chest pain, No palpitations, or arrhythmia.    Respiratory: No shortness of breath, cough, or wheezing.  GI: No nausea or vomiting. No abdominal pain.   Urinary: ESRD   Vascular: No edema.     Musculoskeletal: No muscle weakness, pain, or decreased range of motion.   Neurologic: No loss of sensation, numbness or tingling.   Endocrine: No heat or cold intolerance or polydipsia.   Psychiatric: No insomnia, depression or anxiety.  Examination:  BP (!) 176/113   Pulse 86   Temp 37.3 C (99.2 F)   Resp (!) 23   Wt 72.6 kg (160 lb)   SpO2 (!) 89%   BMI 25.06 kg/m       Filed Vitals:    09/30/21 2130 09/30/21 2145 09/30/21 2215 09/30/21 2300   BP: (!) 167/106 (!) 172/103 (!) 181/110 (!) 176/113   Pulse: 93 89 89 86   Resp: 20 (!) 22 19 (!) 23   Temp:       SpO2: 95% 93% 97% (!) 89%      General: Patient is alert and oriented to person, place, and time. No acute distress. Communicates appropriately.   Head: Normocephalic and atraumatic.    Eyes: Pupils equally round and react to light and accommodate.  Heart: Regular rate and rhythm. S1 & S2 present. No S3 or S4. No rubs, gallops, or murmurs appreciated.  Radial and dorsalis pedis pulses +2/4 bilaterally.  Brisk capillary refill.    Lungs: Clear to auscultation bilaterally with no wheezes or rales. Equal chest excursion.  No conversational dyspnea. No respiratory distress noted.   Abdomen: Soft, nontender, nondistended belly. Bowel sounds are present in all four quadrants. No rigidity.  No guarding.  No ascites.   Extremities: No edema, cyanosis, or clubbing. Grossly moves all extremities.    Skin: Warm and dry without lesions. No ecchymosis noted.    Neurologic: Cranial nerves II through XII are grossly intact. Sensation to light touch is intact. Strength 5/5 in upper extremities and lower extremities bilaterally.    Genitourinary:  No urinary incontinence or Foley catheter    Psychiatric: Judgment and insight are intact. Mood and affect are appropriate for the situation.       Labs:    Results for orders placed or performed during the hospital encounter of 09/30/21 (from the past 24 hour(s))   COMPREHENSIVE METABOLIC PANEL, NON-FASTING   Result Value Ref Range    SODIUM 136 136 - 145 mmol/L    POTASSIUM 3.3 (L) 3.5 - 5.1 mmol/L    CHLORIDE 96 (L) 98 - 107 mmol/L    CO2 TOTAL 30 21 - 31 mmol/L    ANION GAP 10 10 - 20 mmol/L    BUN 16 7 - 25 mg/dL    CREATININE 5.06 (H) 0.60 - 1.30 mg/dL    BUN/CREA RATIO 3 (L) 6 -  22    ESTIMATED GFR 10 (L) >59 mL/min/1.20m^2    ALBUMIN 4.3 3.5 - 5.7 g/dL    CALCIUM 10.2 8.6 - 10.3 mg/dL    GLUCOSE 103 74 - 109 mg/dL    ALKALINE PHOSPHATASE 75 34 - 104 U/L    ALT (SGPT) <7 (L) 7 - 52 U/L    AST (SGOT) 13 13 - 39 U/L    BILIRUBIN TOTAL 1.0 0.3 - 1.2 mg/dL    PROTEIN TOTAL 8.3 6.4 - 8.9 g/dL    ALBUMIN/GLOBULIN RATIO 1.1 0.8 - 1.4    OSMOLALITY, CALCULATED 273 270 - 290 mOsm/kg    CALCIUM, CORRECTED 9.9 8.9 - 10.8 mg/dL    GLOBULIN 4.0 2.9 - 5.4   TROPONIN NOW   Result Value Ref Range    TROPONIN I 67 (H) <15 ng/L   CBC WITH DIFF   Result Value Ref Range    WBCS UNCORRECTED 9.2 x10^3/uL    WBC 9.2 4.0 - 10.5 x10^3/uL    RBC 2.79 (L) 4.20 - 5.40 x10^6/uL    HGB 8.3 (L) 12.5 - 16.0 g/dL    HCT 24.5 (L) 37.0 - 47.0 %    MCV 87.8 78.0 - 99.0 fL    MCH 29.6 27.0 - 32.0 pg    MCHC 33.7 32.0 - 36.0 g/dL    RDW 20.6 (H) 11.6 - 14.8 %    PLATELETS 192 140 - 440 x10^3/uL    MPV 6.6 (L) 7.4 - 10.4 fL    NEUTROPHIL % 77 (H) 40 - 76 %    LYMPHOCYTE % 10 (L) 25 - 45 %    MONOCYTE % 8 0 - 12 %    EOSINOPHIL % 4 0 - 7 %    BASOPHIL % 2 0 - 3 %    NEUTROPHIL # 7.10 1.80 - 8.40 x10^3/uL    LYMPHOCYTE # 0.90 (L) 1.10 - 5.00 x10^3/uL    MONOCYTE # 0.70 0.00 - 1.30 x10^3/uL    EOSINOPHIL # 0.30 0.00 - 0.80 x10^3/uL    BASOPHIL # 0.20 0.00 - 0.30 x10^3/uL   TROPONIN IN ONE HOUR   Result Value Ref Range    TROPONIN I 64 (H) <15 ng/L   URINALYSIS, MACROSCOPIC   Result Value Ref  Range    COLOR Yellow Colorless, Light Yellow, Yellow    APPEARANCE Turbid (A) Clear    SPECIFIC GRAVITY 1.007 1.002 - 1.030    PH 7.0 5.0 - 9.0    LEUKOCYTES 500 (A) Negative, 100  WBCs/uL    NITRITE Negative Negative    PROTEIN 50 (A) Negative, 10 , 20  mg/dL    GLUCOSE Negative Negative, 30  mg/dL    KETONES Negative Negative, Trace mg/dL    BILIRUBIN Negative Negative, 0.5 mg/dL    BLOOD 0.03 Negative, 0.03 mg/dL    UROBILINOGEN Normal Normal mg/dL   URINALYSIS, MICROSCOPIC   Result Value Ref Range    MUCOUS Rare (A) (none) /hpf    RBCS 3 <4 /hpf    WBCS 35 (H) <6 /hpf    WHITE BLOOD CELL CLUMP Few (A) (none) /hpf    SQUAMOUS EPITHELIAL 2 <28 /hpf   TROPONIN IN THREE HOURS   Result Value Ref Range    TROPONIN I 72 (H) <15 ng/L       Imaging Studies:   XR AP MOBILE CHEST  Narrative: Sherece Goyal    RADIOLOGIST: Satira Anis, MD    XR AP MOBILE CHEST performed  on 09/30/2021 6:08 PM    CLINICAL HISTORY: chest pain.  CHEST PAIN    TECHNIQUE: Frontal view of the chest.    COMPARISON:    August July 2023    FINDINGS:    Tunneled right IJ dialysis catheter tip in upper right atrium  Post stenting RA/SVC junction  Stent left axillary region    Mild unchanged cardiomegaly.  Post coronary stenting and/or dense coronary artery calcification  Diffuse indistinctness bronchovascular markings  Diffuse increased attenuation lung parenchyma most pronounced in the mid and lower lung zones bilaterally  No large effusions  No pneumothorax  Impression: Findings likely represent pulmonary edema with overall aeration unchanged compared with 09/28/2021    Radiologist location ID: [id number]       Assessment/Plan:   Active Hospital Problems    Diagnosis   . Primary Problem: Hypertension     Patient be placed in the ICU setting.  Will start patient on nitroglycerin drip.  Patient's home medications need to be restarted once they are reconciled in the computer.  Repeat labs ordered for the a.m.Marland Kitchen  Patient has pain medication as needed.   Nephrology consulted for dialysis.  This is patient's 2nd admission for chest pain and hypertensive urgency in the last week.  Patient be followed by intensivist.     DVT/PE Prophylaxis:  Eliquis    Marlaine Hind, FNP-BC  This note was partially generated using MModal Fluency Direct system, and there may be some incorrect words, spellings, and punctuation that were not noted in checking the note before saving.

## 2021-09-30 NOTE — ED Provider Notes (Signed)
Emergency Medicine    Name: [name]  Age and Gender: 46 y.o. female  Date of Birth: Jul 21, 1975  MRN: [id number]  PCP: Cam Hai, FNP    CC:  Chief Complaint   Patient presents with    Chest Pain        HPI:  [name] is a 46 y.o. 67 American female with history of chest pain all day today.  She is on dialysis and was admitted over the past weekend for chest pain and HTN. She signed out AMA yesterday because 'I was lying there and nobody was doing anything.'  She developed left chest pain this morning, present all day. She states that her HTN did not change with dialysis, which dialysis nurses told her was unusual for her.    She is hypertensive now, she is chronically short of breath and is nauseated today.    She did complete dialysis.      [place] Pain Rating Scale     On a scale of 0-10, during the past 24 hours, pain has interfered with you usual activity:       On a scale of 0-10, during the past 24 hours, pain has interfered with your sleep:      On a scale of 0-10, during the past 24 hours, pain has affected your mood:       On a scale of 0-10, during the past 24 hours, pain has contributed to your stress:       On a scale of 0-10, what is your overall pain Rating: 8        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    Tricuspid valve regurgitation     Uncontrolled hypertension            Allergies   Allergen Reactions    Lisinopril Swelling     Tongue and throat swelling       Past Surgical History:   Procedure Laterality  Date    ESOPHAGOGASTRODUODENOSCOPY      HX BACK SURGERY      HX CHOLECYSTECTOMY      HX FOOT SURGERY Right     HX HYSTERECTOMY      HX TONSILLECTOMY             Social History        Objective:    ED Triage Vitals [09/30/21 1720]   BP (Non-Invasive) (!) 177/100   Heart Rate 94   Respiratory Rate 20   Temperature 37.3 C (99.2 F)   SpO2 97 %   Weight 72.6 kg (160 lb)   Height      Filed Vitals:    09/30/21 1945 09/30/21 2000 09/30/21 2030 09/30/21 2130   BP: (!) 182/142 (!) 188/121 (!) 174/116 (!) 167/106   Pulse: 80 90 97 93   Resp: 20 16 (!) 22  20   Temp:       SpO2: 95% 94% 92% 95%       Nursing notes and vital signs reviewed.    Constitutional - No acute distress.  Alert and Active.  HEENT - Normocephalic. Atraumatic. PERRL. EOMI. Conjunctiva clear. Oropharynx with no erythema, lesions, or exudates. Moist mucous membranes.   Neck - Trachea midline. No stridor. No hoarseness.  Cardiac - Regular rate and rhythm. No murmurs, rubs, or gallops.  Respiratory - Clear to auscultation bilaterally. No rales, wheezes or rhonchi.  Abdomen - Non-tender, soft, non-distended. No rebound or guarding.   Musculoskeletal - Good AROM. No muscle or joint tenderness appreciated. No clubbing, cyanosis or edema.  Skin - Warm and dry, without any rashes or other lesions.  Neuro - Cranial nerves II-XII are grossly intact.  Moving all extremities symmetrically.    Any pertinent labs and imaging obtained during this encounter reviewed below in MDM.    MDM/ED Course:    EKG shows a sinus tachycardia, rate of 104. PR 202, QRS 104.  LVH. Nonspecific STT changes.    [name] was given nitropaste, morphine and even hydralazine but continues to be hypertensive and have chest pain.  Her troponin is still slightly elevated.      I discussed her care with [name], hospitalist, and she will be admitted once more for management of her symptoms.    Medical Decision Making  Amount and/or Complexity of Data Reviewed  Labs: ordered. Decision-making  details documented in ED Course.  Radiology: ordered. Decision-making details documented in ED Course.  ECG/medicine tests: ordered. Decision-making details documented in ED Course.    Risk  Decision regarding hospitalization.             Orders Placed This Encounter    URINE CULTURE,ROUTINE    XR AP MOBILE CHEST    COMPREHENSIVE METABOLIC PANEL, NON-FASTING    CBC/DIFF    TROPONIN NOW    TROPONIN IN ONE HOUR    TROPONIN IN THREE HOURS    CBC WITH DIFF    URINALYSIS, MACROSCOPIC AND MICROSCOPIC W/CULTURE REFLEX [PRN ONLY]    URINALYSIS, MACROSCOPIC    URINALYSIS, MICROSCOPIC    ECG 12 LEAD    nitroGLYCERIN (NITRO-BID) 2 % topical ointment    aspirin chewable tablet 324 mg    morphine 4 mg/mL injection    hydrALAZINE (APRESOLINE) injection 20 mg    morphine 4 mg/mL injection         Impression:   Clinical Impression   Chest pain, unspecified type (Primary)   Hypertension, unspecified type   ESRD on dialysis (CMS [facility])       Disposition: Admitted          Portions of this note may have been dictated using voice recognition software.     Dawn Lederer, MD  [facility] ED    -----------------------  Results for orders placed or performed during the hospital encounter of 09/30/21 (from the past 12 hour(s))   COMPREHENSIVE METABOLIC PANEL, NON-FASTING   Result Value Ref Range    SODIUM 136 136 - 145 mmol/L    POTASSIUM 3.3 (L) 3.5 - 5.1 mmol/L    CHLORIDE 96 (L) 98 - 107 mmol/L    CO2 TOTAL 30 21 - 31 mmol/L    ANION GAP 10 10 - 20 mmol/L    BUN 16 7 - 25 mg/dL    CREATININE 5.06 (H) 0.60 - 1.30 mg/dL    BUN/CREA RATIO 3 (  L) 6 - 22    ESTIMATED GFR 10 (L) >59 mL/min/1.78m^2    ALBUMIN 4.3 3.5 - 5.7 g/dL    CALCIUM 10.2 8.6 - 10.3 mg/dL    GLUCOSE 103 74 - 109 mg/dL    ALKALINE PHOSPHATASE 75 34 - 104 U/L    ALT (SGPT) <7 (L) 7 - 52 U/L    AST (SGOT) 13 13 - 39 U/L    BILIRUBIN TOTAL 1.0 0.3 - 1.2 mg/dL    PROTEIN TOTAL 8.3 6.4 - 8.9 g/dL    ALBUMIN/GLOBULIN RATIO 1.1 0.8 - 1.4    OSMOLALITY, CALCULATED 273  270 - 290 mOsm/kg    CALCIUM, CORRECTED 9.9 8.9 - 10.8 mg/dL    GLOBULIN 4.0 2.9 - 5.4   TROPONIN NOW   Result Value Ref Range    TROPONIN I 67 (H) <15 ng/L   CBC WITH DIFF   Result Value Ref Range    WBCS UNCORRECTED 9.2 x10^3/uL    WBC 9.2 4.0 - 10.5 x10^3/uL    RBC 2.79 (L) 4.20 - 5.40 x10^6/uL    HGB 8.3 (L) 12.5 - 16.0 g/dL    HCT 24.5 (L) 37.0 - 47.0 %    MCV 87.8 78.0 - 99.0 fL    MCH 29.6 27.0 - 32.0 pg    MCHC 33.7 32.0 - 36.0 g/dL    RDW 20.6 (H) 11.6 - 14.8 %    PLATELETS 192 140 - 440 x10^3/uL    MPV 6.6 (L) 7.4 - 10.4 fL    NEUTROPHIL % 77 (H) 40 - 76 %    LYMPHOCYTE % 10 (L) 25 - 45 %    MONOCYTE % 8 0 - 12 %    EOSINOPHIL % 4 0 - 7 %    BASOPHIL % 2 0 - 3 %    NEUTROPHIL # 7.10 1.80 - 8.40 x10^3/uL    LYMPHOCYTE # 0.90 (L) 1.10 - 5.00 x10^3/uL    MONOCYTE # 0.70 0.00 - 1.30 x10^3/uL    EOSINOPHIL # 0.30 0.00 - 0.80 x10^3/uL    BASOPHIL # 0.20 0.00 - 0.30 x10^3/uL   TROPONIN IN ONE HOUR   Result Value Ref Range    TROPONIN I 64 (H) <15 ng/L   URINALYSIS, MACROSCOPIC   Result Value Ref Range    COLOR Yellow Colorless, Light Yellow, Yellow    APPEARANCE Turbid (A) Clear    SPECIFIC GRAVITY 1.007 1.002 - 1.030    PH 7.0 5.0 - 9.0    LEUKOCYTES 500 (A) Negative, 100  WBCs/uL    NITRITE Negative Negative    PROTEIN 50 (A) Negative, 10 , 20  mg/dL    GLUCOSE Negative Negative, 30  mg/dL    KETONES Negative Negative, Trace mg/dL    BILIRUBIN Negative Negative, 0.5 mg/dL    BLOOD 0.03 Negative, 0.03 mg/dL    UROBILINOGEN Normal Normal mg/dL   URINALYSIS, MICROSCOPIC   Result Value Ref Range    MUCOUS Rare (A) (none) /hpf    RBCS 3 <4 /hpf    WBCS 35 (H) <6 /hpf    WHITE BLOOD CELL CLUMP Few (A) (none) /hpf    SQUAMOUS EPITHELIAL 2 <28 /hpf   TROPONIN IN THREE HOURS   Result Value Ref Range    TROPONIN I 72 (H) <15 ng/L     XR AP MOBILE CHEST   Final Result   Findings likely represent pulmonary edema with overall aeration unchanged compared with 09/28/2021  Radiologist location ID: [id number]

## 2021-09-30 NOTE — ED Triage Notes (Signed)
Chest pain post dialysis . 18 g Zofran 4mg  nitro x 1 no relief

## 2021-09-30 NOTE — H&P (Signed)
[facility]  History and Physical    Date of Service:  09/30/21   [name], [name], 46 y.o. female  Encounter Start Date:  09/30/2021  Inpatient Admission Date: 09/30/2021  Date of Birth:  30-Sep-1975  PCP: Cam Hai, FNP    Chief Complaint:  chest pain    HPI: [name] is a 46 y.o. 89 American female with history of chest pain all day today.  She is on dialysis and was admitted over the past weekend for chest pain and HTN. She signed out AMA yesterday because 'I was lying there and nobody was doing anything.'  She developed left chest pain this morning, states that it woke her up, present all day. She states that her HTN did not change with dialysis, which dialysis nurses told her was unusual for her. She is hypertensive now, she is chronically short of breath and is nauseated today. She did complete dialysis.  Patient had morphine and nitro paste in the ED. patient remains hypertensive.  Still having chest pain.  Complain of a headache.  Will start patient on nitro drip placed in the ICU setting.  Patient states he is due to have cardiac catheterization on August attempt with Dr. Viann Shove.     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    Tricuspid valve regurgitation     Uncontrolled hypertension              Medications Prior to Admission       Prescriptions    acetaminophen (TYLENOL) 325 mg Oral Tablet    Take 2 Tablets (650 mg total) by mouth Every 4 hours as needed    albuterol sulfate (PROVENTIL OR VENTOLIN OR PROAIR) 90 mcg/actuation  Inhalation oral inhaler    Take 1-2 Puffs by inhalation Every 6 hours as needed    apixaban (ELIQUIS) 2.5 mg Oral Tablet    Take 1 Tablet (2.5 mg total) by mouth Twice daily for 41 doses    budesonide-formoteroL (SYMBICORT) 160-4.5 mcg/actuation Inhalation oral inhaler    Take 2 Puffs by inhalation Twice daily    calcitrioL (ROCALTROL) 0.5 mcg Oral Capsule    Take 2 Capsules (1 mcg total) by mouth Every Monday, Wednesday and Friday    cinacalcet (SENSIPAR) 30 mg Oral Tablet    Take 1 Tablet (30 mg total) by mouth Every evening    cloNIDine (CATAPRES-TTS) 0.3 mg/24 hr Transdermal Patch Weekly    Place 1 Patch (0.3 mg total) on the skin Every 7 days    cloNIDine HCL (CATAPRES) 0.2 mg Oral Tablet    Take 1 Tablet (0.2 mg total) by mouth Twice daily for 30 days    clopidogreL (PLAVIX) 75 mg Oral Tablet    Take 1 Tablet (75 mg total) by mouth Once a day    cyclobenzaprine (FLEXERIL) 5 mg Oral Tablet    Take 1  Tablet (5 mg total) by mouth Twice per day as needed for Muscle spasms    dilTIAZem (CARDIZEM CD) 180 mg Oral Capsule, Sust. Release 24 hr    Take 1 Capsule (180 mg total) by mouth Once a day for 30 days    doxazosin (CARDURA) 4 mg Oral Tablet    Take 1 Tablet (4 mg total) by mouth Every evening    doxycycline hyclate (VIBRAMYCIN) 100 mg Oral Capsule    Take 1 Capsule (100 mg total) by mouth Twice daily for 5 days    ferric citrate (AURYXIA ORAL)    Take 1 g by mouth Three times daily before meals "Take 2 tablets three times daily before meals and 1 tablet before snacks"    furosemide (LASIX) 80 mg Oral Tablet    Take 2 Tablets (160 mg total) by mouth Once a day Take after dialysis on dialysis days; otherwise take in morning daily.    guaiFENesin (MUCINEX) 600 mg Oral Tablet Extended Release 12hr    Take 1 Tablet (600 mg total) by mouth Every 12 hours    isosorbide mononitrate (IMDUR) 120 mg Oral Tablet Sustained Release 24 hr    Take 1 Tablet (120 mg total) by mouth Every morning for 30 days    labetaloL  (NORMODYNE) 300 mg Oral Tablet    Take 1 Tablet (300 mg total) by mouth Every 12 hours for 30 days    loratadine (CLARITIN) 10 mg Oral Tablet    Take 1 Tablet (10 mg total) by mouth Once a day    montelukast (SINGULAIR) 10 mg Oral Tablet    Take 1 Tablet (10 mg total) by mouth Every evening    nitroGLYCERIN (NITROSTAT) 0.4 mg Sublingual Tablet, Sublingual    Place 1 Tablet (0.4 mg total) under the tongue Every 5 minutes for 3 doses for 3 doses over 15 minutes    ondansetron (ZOFRAN) 4 mg Oral Tablet    Take 1 Tablet (4 mg total) by mouth Every 12 hours as needed for Nausea/Vomiting    pantoprazole (PROTONIX) 40 mg Oral Tablet, Delayed Release (E.C.)    Take 1 Tablet (40 mg total) by mouth Once a day    vancomycin 1,500 mg in NS 15 mL infusion    1,500 mg Give in Dialysis Mix and infuse per policy of [facility].          Allergies   Allergen Reactions    Lisinopril Swelling     Tongue and throat swelling       Past Surgical History:  Past Surgical History:   Procedure Laterality Date    ESOPHAGOGASTRODUODENOSCOPY      HX BACK SURGERY      HX CHOLECYSTECTOMY      HX FOOT SURGERY Right     HX HYSTERECTOMY      HX TONSILLECTOMY             Family History:  Family Medical History:       Problem Relation (Age of Onset)    Breast Cancer Mother    Coronary Artery Disease Mother    Diabetes type II Mother    Hypertension (High Blood Pressure) Father               Social History:  Social History     Tobacco Use    Smoking status: Every Day     Packs/day: 1.00     Years: 10.00     Pack years: 10.00  Types: Cigarettes    Smokeless tobacco: Former    Tobacco comments:     "Haven't had a cigerette in 2 weeks."   Vaping Use    Vaping Use: Never used   Substance Use Topics    Alcohol use: Not Currently    Drug use: Yes     Types: Marijuana        Review of Systems:  General: No fever or chills. No weight changes, fatigue, weakness.   HEENT: No headaches, dizziness, changes in vision, changes in hearing, or difficulty  swallowing.    Skin:  No rashes, erythema or bruises.   Cardiac: chest pain, No palpitations, or arrhythmia.    Respiratory: No shortness of breath, cough, or wheezing.  GI: No nausea or vomiting. No abdominal pain.   Urinary: ESRD   Vascular: No edema.     Musculoskeletal: No muscle weakness, pain, or decreased range of motion.   Neurologic: No loss of sensation, numbness or tingling.   Endocrine: No heat or cold intolerance or polydipsia.   Psychiatric: No insomnia, depression or anxiety.  Examination:  BP (!) 176/113   Pulse 86   Temp 37.3 C (99.2 F)   Resp (!) 23   Wt 72.6 kg (160 lb)   SpO2 (!) 89%   BMI 25.06 kg/m       Filed Vitals:    09/30/21 2130 09/30/21 2145 09/30/21 2215 09/30/21 2300   BP: (!) 167/106 (!) 172/103 (!) 181/110 (!) 176/113   Pulse: 93 89 89 86   Resp: 20 (!) 22 19 (!) 23   Temp:       SpO2: 95% 93% 97% (!) 89%      General: Patient is alert and oriented to person, place, and time. No acute distress. Communicates appropriately.   Head: Normocephalic and atraumatic.    Eyes: Pupils equally round and react to light and accommodate.  Heart: Regular rate and rhythm. S1 & S2 present. No S3 or S4. No rubs, gallops, or murmurs appreciated.  Radial and dorsalis pedis pulses +2/4 bilaterally.  Brisk capillary refill.    Lungs: Clear to auscultation bilaterally with no wheezes or rales. Equal chest excursion.  No conversational dyspnea. No respiratory distress noted.   Abdomen: Soft, nontender, nondistended belly. Bowel sounds are present in all four quadrants. No rigidity.  No guarding.  No ascites.   Extremities: No edema, cyanosis, or clubbing. Grossly moves all extremities.    Skin: Warm and dry without lesions. No ecchymosis noted.    Neurologic: Cranial nerves II through XII are grossly intact. Sensation to light touch is intact. Strength 5/5 in upper extremities and lower extremities bilaterally.    Genitourinary:  No urinary incontinence or Foley catheter   Psychiatric: Judgment and  insight are intact. Mood and affect are appropriate for the situation.       Labs:    Results for orders placed or performed during the hospital encounter of 09/30/21 (from the past 24 hour(s))   COMPREHENSIVE METABOLIC PANEL, NON-FASTING   Result Value Ref Range    SODIUM 136 136 - 145 mmol/L    POTASSIUM 3.3 (L) 3.5 - 5.1 mmol/L    CHLORIDE 96 (L) 98 - 107 mmol/L    CO2 TOTAL 30 21 - 31 mmol/L    ANION GAP 10 10 - 20 mmol/L    BUN 16 7 - 25 mg/dL    CREATININE 5.06 (H) 0.60 - 1.30 mg/dL    BUN/CREA RATIO 3 (L) 6 -  22    ESTIMATED GFR 10 (L) >59 mL/min/1.79m^2    ALBUMIN 4.3 3.5 - 5.7 g/dL    CALCIUM 10.2 8.6 - 10.3 mg/dL    GLUCOSE 103 74 - 109 mg/dL    ALKALINE PHOSPHATASE 75 34 - 104 U/L    ALT (SGPT) <7 (L) 7 - 52 U/L    AST (SGOT) 13 13 - 39 U/L    BILIRUBIN TOTAL 1.0 0.3 - 1.2 mg/dL    PROTEIN TOTAL 8.3 6.4 - 8.9 g/dL    ALBUMIN/GLOBULIN RATIO 1.1 0.8 - 1.4    OSMOLALITY, CALCULATED 273 270 - 290 mOsm/kg    CALCIUM, CORRECTED 9.9 8.9 - 10.8 mg/dL    GLOBULIN 4.0 2.9 - 5.4   TROPONIN NOW   Result Value Ref Range    TROPONIN I 67 (H) <15 ng/L   CBC WITH DIFF   Result Value Ref Range    WBCS UNCORRECTED 9.2 x10^3/uL    WBC 9.2 4.0 - 10.5 x10^3/uL    RBC 2.79 (L) 4.20 - 5.40 x10^6/uL    HGB 8.3 (L) 12.5 - 16.0 g/dL    HCT 24.5 (L) 37.0 - 47.0 %    MCV 87.8 78.0 - 99.0 fL    MCH 29.6 27.0 - 32.0 pg    MCHC 33.7 32.0 - 36.0 g/dL    RDW 20.6 (H) 11.6 - 14.8 %    PLATELETS 192 140 - 440 x10^3/uL    MPV 6.6 (L) 7.4 - 10.4 fL    NEUTROPHIL % 77 (H) 40 - 76 %    LYMPHOCYTE % 10 (L) 25 - 45 %    MONOCYTE % 8 0 - 12 %    EOSINOPHIL % 4 0 - 7 %    BASOPHIL % 2 0 - 3 %    NEUTROPHIL # 7.10 1.80 - 8.40 x10^3/uL    LYMPHOCYTE # 0.90 (L) 1.10 - 5.00 x10^3/uL    MONOCYTE # 0.70 0.00 - 1.30 x10^3/uL    EOSINOPHIL # 0.30 0.00 - 0.80 x10^3/uL    BASOPHIL # 0.20 0.00 - 0.30 x10^3/uL   TROPONIN IN ONE HOUR   Result Value Ref Range    TROPONIN I 64 (H) <15 ng/L   URINALYSIS, MACROSCOPIC   Result Value Ref Range    COLOR Yellow  Colorless, Light Yellow, Yellow    APPEARANCE Turbid (A) Clear    SPECIFIC GRAVITY 1.007 1.002 - 1.030    PH 7.0 5.0 - 9.0    LEUKOCYTES 500 (A) Negative, 100  WBCs/uL    NITRITE Negative Negative    PROTEIN 50 (A) Negative, 10 , 20  mg/dL    GLUCOSE Negative Negative, 30  mg/dL    KETONES Negative Negative, Trace mg/dL    BILIRUBIN Negative Negative, 0.5 mg/dL    BLOOD 0.03 Negative, 0.03 mg/dL    UROBILINOGEN Normal Normal mg/dL   URINALYSIS, MICROSCOPIC   Result Value Ref Range    MUCOUS Rare (A) (none) /hpf    RBCS 3 <4 /hpf    WBCS 35 (H) <6 /hpf    WHITE BLOOD CELL CLUMP Few (A) (none) /hpf    SQUAMOUS EPITHELIAL 2 <28 /hpf   TROPONIN IN THREE HOURS   Result Value Ref Range    TROPONIN I 72 (H) <15 ng/L       Imaging Studies:   XR AP MOBILE CHEST  Narrative: [name]    RADIOLOGIST: Satira Anis, MD    XR AP MOBILE CHEST performed  on 09/30/2021 6:08 PM    CLINICAL HISTORY: chest pain.  CHEST PAIN    TECHNIQUE: Frontal view of the chest.    COMPARISON:    August July 2023    FINDINGS:    Tunneled right IJ dialysis catheter tip in upper right atrium  Post stenting RA/SVC junction  Stent left axillary region    Mild unchanged cardiomegaly.  Post coronary stenting and/or dense coronary artery calcification  Diffuse indistinctness bronchovascular markings  Diffuse increased attenuation lung parenchyma most pronounced in the mid and lower lung zones bilaterally  No large effusions  No pneumothorax  Impression: Findings likely represent pulmonary edema with overall aeration unchanged compared with 09/28/2021    Radiologist location ID: [id number]       Assessment/Plan:   Active Hospital Problems    Diagnosis    Primary Problem: Hypertension     Patient be placed in the ICU setting.  Will start patient on nitroglycerin drip.  Patient's home medications need to be restarted once they are reconciled in the computer.  Repeat labs ordered for the a.m.Marland Kitchen  Patient has pain medication as needed.  Nephrology consulted  for dialysis.  This is patient's 2nd admission for chest pain and hypertensive urgency in the last week.  Patient be followed by intensivist.     DVT/PE Prophylaxis:  Eliquis    Marlaine Hind, FNP-BC  This note was partially generated using MModal Fluency Direct system, and there may be some incorrect words, spellings, and punctuation that were not noted in checking the note before saving.

## 2021-09-30 NOTE — Progress Notes (Signed)
Spoke to nurse at dialysis center pt missed a dose of Vanc and will receive last dose today and they will stop her iv abx and d/c from opat today.  Melonie Florida  LPN  [facility] Nurse   [phone]

## 2021-09-30 NOTE — ED Nurses Note (Signed)
Patient arrived to ER with 18G in the left forearm below her av fistula. Dialysis confirms patient hasn't used that in nearly 2 years. Patient also reports that they stick that arm all of the time.

## 2021-09-30 NOTE — ED Nurses Note (Signed)
REPORT CALLED TO KARA, RN IN ICU AT THIS TIME.

## 2021-10-01 DIAGNOSIS — I120 Hypertensive chronic kidney disease with stage 5 chronic kidney disease or end stage renal disease: Secondary | ICD-10-CM

## 2021-10-01 LAB — MAGNESIUM
MAGNESIUM: 2 mg/dL (ref 1.9–2.7)
MAGNESIUM: 2.2 mg/dL (ref 1.9–2.7)

## 2021-10-01 LAB — CBC
HCT: 23.5 % — ABNORMAL LOW (ref 37.0–47.0)
HGB: 7.8 g/dL — ABNORMAL LOW (ref 12.5–16.0)
MCH: 29.1 pg (ref 27.0–32.0)
MCHC: 33.1 g/dL (ref 32.0–36.0)
MCV: 88 fL (ref 78.0–99.0)
MPV: 7.1 fL — ABNORMAL LOW (ref 7.4–10.4)
PLATELETS: 200 10*3/uL (ref 140–440)
RBC: 2.67 10*6/uL — ABNORMAL LOW (ref 4.20–5.40)
RDW: 20.7 % — ABNORMAL HIGH (ref 11.6–14.8)
WBC: 7.4 10*3/uL (ref 4.0–10.5)
WBCS UNCORRECTED: 7.4 10*3/uL

## 2021-10-01 LAB — COMPREHENSIVE METABOLIC PANEL, NON-FASTING
ALBUMIN/GLOBULIN RATIO: 1.1 (ref 0.8–1.4)
ALBUMIN/GLOBULIN RATIO: 1.3 (ref 0.8–1.4)
ALBUMIN: 4.1 g/dL (ref 3.5–5.7)
ALBUMIN: 4.1 g/dL (ref 3.5–5.7)
ALKALINE PHOSPHATASE: 71 U/L (ref 34–104)
ALKALINE PHOSPHATASE: 71 U/L (ref 34–104)
ALT (SGPT): <7 U/L — ABNORMAL LOW (ref 7–52)
ALT (SGPT): <7 U/L — ABNORMAL LOW (ref 7–52)
ANION GAP: 10 mmol/L (ref 10–20)
ANION GAP: 11 mmol/L (ref 10–20)
AST (SGOT): 12 U/L — ABNORMAL LOW (ref 13–39)
AST (SGOT): 13 U/L (ref 13–39)
BILIRUBIN TOTAL: 0.9 mg/dL (ref 0.3–1.2)
BILIRUBIN TOTAL: 0.9 mg/dL (ref 0.3–1.2)
BUN/CREA RATIO: 4 — ABNORMAL LOW (ref 6–22)
BUN/CREA RATIO: 4 — ABNORMAL LOW (ref 6–22)
BUN: 20 mg/dL (ref 7–25)
BUN: 25 mg/dL (ref 7–25)
CALCIUM, CORRECTED: 9.8 mg/dL (ref 8.9–10.8)
CALCIUM, CORRECTED: 9.9 mg/dL (ref 8.9–10.8)
CALCIUM: 9.9 mg/dL (ref 8.6–10.3)
CALCIUM: 10 mg/dL (ref 8.6–10.3)
CHLORIDE: 97 mmol/L — ABNORMAL LOW (ref 98–107)
CHLORIDE: 97 mmol/L — ABNORMAL LOW (ref 98–107)
CO2 TOTAL: 28 mmol/L (ref 21–31)
CO2 TOTAL: 28 mmol/L (ref 21–31)
CREATININE: 5.42 mg/dL — ABNORMAL HIGH (ref 0.60–1.30)
CREATININE: 6.44 mg/dL — ABNORMAL HIGH (ref 0.60–1.30)
ESTIMATED GFR: 9 mL/min/{1.73_m2} — ABNORMAL LOW (ref >59)
ESTIMATED GFR: 8 mL/min/{1.73_m2} — ABNORMAL LOW (ref >59)
GLOBULIN: 3.6 (ref 2.9–5.4)
GLOBULIN: 3.1 (ref 2.9–5.4)
GLUCOSE: 102 mg/dL (ref 74–109)
GLUCOSE: 113 mg/dL — ABNORMAL HIGH (ref 74–109)
OSMOLALITY, CALCULATED: 273 mOsm/kg (ref 270–290)
OSMOLALITY, CALCULATED: 277 mOsm/kg (ref 270–290)
POTASSIUM: 3.5 mmol/L (ref 3.5–5.1)
POTASSIUM: 3.9 mmol/L (ref 3.5–5.1)
PROTEIN TOTAL: 7.7 g/dL (ref 6.4–8.9)
PROTEIN TOTAL: 7.2 g/dL (ref 6.4–8.9)
SODIUM: 135 mmol/L — ABNORMAL LOW (ref 136–145)
SODIUM: 136 mmol/L (ref 136–145)

## 2021-10-01 LAB — GOLD TOP TUBE

## 2021-10-01 LAB — TROPONIN-I: TROPONIN I: 72 ng/L — ABNORMAL HIGH (ref <15)

## 2021-10-01 MED ORDER — CLONIDINE 0.1 MG/24 HR WEEKLY TRANSDERMAL PATCH
0.3000 mg | MEDICATED_PATCH | TRANSDERMAL | Status: DC
Start: 2021-10-04 — End: 2021-10-03
  Filled 2021-10-01: qty 1

## 2021-10-01 MED ORDER — SODIUM CHLORIDE 0.9 % INTRAVENOUS SOLUTION
5.0000 mg/h | INTRAVENOUS | Status: DC
Start: 2021-10-01 — End: 2021-10-02
  Administered 2021-10-01: 7.5 mg/h via INTRAVENOUS
  Administered 2021-10-01: 5 mg/h via INTRAVENOUS
  Administered 2021-10-01: 7.5 mg/h via INTRAVENOUS
  Administered 2021-10-01 (×2): 5 mg/h via INTRAVENOUS
  Administered 2021-10-01: 7.5 mg/h via INTRAVENOUS
  Administered 2021-10-02: 5 mg/h via INTRAVENOUS
  Administered 2021-10-02: 2.5 mg/h via INTRAVENOUS
  Administered 2021-10-02: 0 mg/h via INTRAVENOUS
  Administered 2021-10-02: 5 mg/h via INTRAVENOUS
  Filled 2021-10-01 (×11): qty 10

## 2021-10-01 MED ORDER — DIPHENHYDRAMINE 25 MG CAPSULE
50.0000 mg | ORAL_CAPSULE | Freq: Four times a day (QID) | ORAL | Status: DC | PRN
Start: 2021-10-01 — End: 2021-10-03
  Administered 2021-10-01 (×3): 50 mg via ORAL
  Filled 2021-10-01 (×3): qty 2

## 2021-10-01 MED ORDER — ISOSORBIDE MONONITRATE ER 60 MG TABLET,EXTENDED RELEASE 24 HR
120.0000 mg | ORAL_TABLET | Freq: Every morning | ORAL | Status: DC
Start: 2021-10-01 — End: 2021-10-03
  Administered 2021-10-01: 60 mg via ORAL
  Administered 2021-10-02 – 2021-10-03 (×2): 120 mg via ORAL
  Filled 2021-10-01 (×3): qty 2

## 2021-10-01 MED ORDER — MORPHINE 4 MG/ML INJECTION WRAPPER
4.0000 mg | INJECTION | Freq: Four times a day (QID) | INTRAMUSCULAR | Status: DC | PRN
Start: 2021-10-01 — End: 2021-10-01
  Administered 2021-10-01 (×2): 4 mg via INTRAVENOUS
  Filled 2021-10-01 (×2): qty 1

## 2021-10-01 MED ORDER — ONDANSETRON HCL (PF) 4 MG/2 ML INJECTION SOLUTION
4.0000 mg | Freq: Three times a day (TID) | INTRAMUSCULAR | Status: DC | PRN
Start: 2021-10-01 — End: 2021-10-03
  Administered 2021-10-01: 4 mg via INTRAVENOUS
  Filled 2021-10-01: qty 2

## 2021-10-01 MED ORDER — MONTELUKAST 10 MG TABLET
10.0000 mg | ORAL_TABLET | Freq: Every evening | ORAL | Status: DC
Start: 2021-10-01 — End: 2021-10-03
  Administered 2021-10-01 – 2021-10-02 (×2): 10 mg via ORAL
  Filled 2021-10-01 (×2): qty 1

## 2021-10-01 MED ORDER — LABETALOL 200 MG TABLET
300.0000 mg | ORAL_TABLET | Freq: Two times a day (BID) | ORAL | Status: DC
Start: 2021-10-01 — End: 2021-10-03
  Administered 2021-10-01 – 2021-10-03 (×5): 300 mg via ORAL
  Filled 2021-10-01 (×4): qty 2
  Filled 2021-10-01: qty 1.5

## 2021-10-01 MED ORDER — DOXAZOSIN 4 MG TABLET
4.0000 mg | ORAL_TABLET | Freq: Every evening | ORAL | Status: DC
Start: 2021-10-01 — End: 2021-10-03
  Administered 2021-10-01 – 2021-10-02 (×2): 4 mg via ORAL
  Filled 2021-10-01 (×2): qty 1

## 2021-10-01 MED ORDER — DILTIAZEM CD 180 MG CAPSULE,EXTENDED RELEASE 24 HR
180.0000 mg | ORAL_CAPSULE | Freq: Every day | ORAL | Status: DC
Start: 2021-10-01 — End: 2021-10-03
  Administered 2021-10-01 – 2021-10-03 (×3): 180 mg via ORAL
  Filled 2021-10-01 (×3): qty 1

## 2021-10-01 MED ORDER — SODIUM CHLORIDE 0.9 % IV BOLUS
40.0000 mL | INJECTION | Freq: Once | Status: DC | PRN
Start: 2021-10-01 — End: 2021-10-02

## 2021-10-01 MED ORDER — LORATADINE 10 MG TABLET
10.0000 mg | ORAL_TABLET | Freq: Every day | ORAL | Status: DC
Start: 2021-10-01 — End: 2021-10-03
  Administered 2021-10-01 – 2021-10-02 (×2): 10 mg via ORAL
  Filled 2021-10-01 (×2): qty 1

## 2021-10-01 MED ORDER — FUROSEMIDE 40 MG TABLET
160.0000 mg | ORAL_TABLET | Freq: Every day | ORAL | Status: DC
Start: 2021-10-01 — End: 2021-10-03
  Administered 2021-10-02: 160 mg via ORAL
  Filled 2021-10-01: qty 4

## 2021-10-01 MED ORDER — GUAIFENESIN ER 600 MG TABLET, EXTENDED RELEASE 12 HR
600.0000 mg | EXTENDED_RELEASE_TABLET | Freq: Two times a day (BID) | ORAL | Status: DC
Start: 2021-10-01 — End: 2021-10-03
  Administered 2021-10-01 (×2): 0 mg via ORAL
  Administered 2021-10-02 (×2): 600 mg via ORAL
  Filled 2021-10-01 (×4): qty 1

## 2021-10-01 MED ORDER — ETHYL ALCOHOL 62 % (NOZIN NASAL SANITIZER) NASAL SOLUTION - BULK BOTTLE
1.0000 | Freq: Two times a day (BID) | NASAL | Status: DC
Start: 2021-10-01 — End: 2021-10-03
  Administered 2021-10-01 – 2021-10-02 (×4): 1 via NASAL

## 2021-10-01 MED ORDER — PANTOPRAZOLE 40 MG TABLET,DELAYED RELEASE
40.0000 mg | DELAYED_RELEASE_TABLET | Freq: Every day | ORAL | Status: DC
Start: 2021-10-01 — End: 2021-10-03
  Administered 2021-10-01 – 2021-10-02 (×2): 40 mg via ORAL
  Administered 2021-10-03: 0 mg via ORAL
  Filled 2021-10-01 (×2): qty 1

## 2021-10-01 MED ORDER — BUDESONIDE-FORMOTEROL HFA 160 MCG-4.5 MCG/ACTUATION AEROSOL INHALER
2.0000 | INHALATION_SPRAY | Freq: Two times a day (BID) | RESPIRATORY_TRACT | Status: DC
Start: 2021-10-01 — End: 2021-10-03
  Administered 2021-10-01: 0 via RESPIRATORY_TRACT
  Administered 2021-10-01 – 2021-10-02 (×3): 2 via RESPIRATORY_TRACT
  Administered 2021-10-03: 0 via RESPIRATORY_TRACT
  Filled 2021-10-01: qty 12

## 2021-10-01 MED ORDER — CLOPIDOGREL 75 MG TABLET
75.0000 mg | ORAL_TABLET | Freq: Every day | ORAL | Status: DC
Start: 2021-10-01 — End: 2021-10-03
  Administered 2021-10-01: 0 mg via ORAL
  Administered 2021-10-02 – 2021-10-03 (×2): 75 mg via ORAL
  Filled 2021-10-01 (×3): qty 1

## 2021-10-01 MED ORDER — CYCLOBENZAPRINE 10 MG TABLET
5.0000 mg | ORAL_TABLET | Freq: Two times a day (BID) | ORAL | Status: DC | PRN
Start: 2021-10-01 — End: 2021-10-03
  Administered 2021-10-01 – 2021-10-03 (×2): 5 mg via ORAL
  Filled 2021-10-01 (×2): qty 1

## 2021-10-01 MED ORDER — CINACALCET 30 MG TABLET
30.0000 mg | ORAL_TABLET | Freq: Every evening | ORAL | Status: DC
Start: 2021-10-01 — End: 2021-10-03
  Administered 2021-10-01 – 2021-10-02 (×2): 30 mg via ORAL
  Filled 2021-10-01 (×2): qty 1

## 2021-10-01 MED ORDER — APIXABAN 5 MG TABLET
2.5000 mg | ORAL_TABLET | Freq: Two times a day (BID) | ORAL | Status: DC
Start: 2021-10-01 — End: 2021-10-03
  Administered 2021-10-01 – 2021-10-02 (×3): 0 mg via ORAL

## 2021-10-01 MED ORDER — CALCITRIOL 0.25 MCG CAPSULE
1.0000 ug | ORAL_CAPSULE | ORAL | Status: DC
Start: 2021-10-02 — End: 2021-10-03
  Administered 2021-10-02: 1 ug via ORAL
  Filled 2021-10-01: qty 4

## 2021-10-01 NOTE — Consults (Signed)
[facility]   Nephrology Consult      Drummond, [place], 46 y.o. female  Encounter Start Date:  09/30/2021  Inpatient Admission Date:  09/30/2021  Date of Birth:  1975/06/02    Admitting Provider:  Critical care team    Reason for Consult:  End-stage renal disease on hemodialysis    HPI:   Nyesha Cliff is a 46 y.o. 16 American female with history of chest pain all day today.  She is on dialysis and was admitted over the past weekend for chest pain and HTN. She signed out AMA yesterday because 'I was lying there and nobody was doing anything.'  She developed left chest pain this morning admitted to be seen by Cardiology rule out MI patient was seen by Cardiology may have a heart catheterization tomorrow  End-stage renal disease hemodialysis Monday Wednesday Friday with anemia of chronic kidney disease no indication for dialysis today electrolytes within normal not fluid overload next dialysis tomorrow will give her 1 unit of blood because her hemoglobin below 8 check PTH anemia profile treatment per protocol  Essential hypertension keep blood pressure controlled will adjust medications    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    Tricuspid valve regurgitation     Uncontrolled hypertension          Medications Prior to Admission       Prescriptions    acetaminophen (TYLENOL) 325 mg Oral Tablet    Take 2 Tablets (650 mg total) by mouth Every 4 hours as needed    albuterol sulfate (PROVENTIL OR VENTOLIN OR PROAIR) 90 mcg/actuation Inhalation oral inhaler    Take 1-2 Puffs by  inhalation Every 6 hours as needed    apixaban (ELIQUIS) 2.5 mg Oral Tablet    Take 1 Tablet (2.5 mg total) by mouth Twice daily for 41 doses    budesonide-formoteroL (SYMBICORT) 160-4.5 mcg/actuation Inhalation oral inhaler    Take 2 Puffs by inhalation Twice daily    calcitrioL (ROCALTROL) 0.5 mcg Oral Capsule    Take 2 Capsules (1 mcg total) by mouth Every Monday, Wednesday and Friday    cinacalcet (SENSIPAR) 30 mg Oral Tablet    Take 1 Tablet (30 mg total) by mouth Every evening    cloNIDine (CATAPRES-TTS) 0.3 mg/24 hr Transdermal Patch Weekly    Place 1 Patch (0.3 mg total) on the skin Every 7 days    cloNIDine HCL (CATAPRES) 0.2 mg Oral Tablet    Take 1 Tablet (0.2 mg total) by mouth Twice daily for 30 days    clopidogreL (PLAVIX) 75 mg Oral Tablet    Take 1 Tablet (75 mg total) by mouth Once a day    cyclobenzaprine (FLEXERIL) 5 mg Oral Tablet    Take 1 Tablet (5 mg total) by mouth Twice per day as needed for Muscle spasms    dilTIAZem (CARDIZEM CD) 180 mg Oral Capsule, Sust. Release  24 hr    Take 1 Capsule (180 mg total) by mouth Once a day for 30 days    doxazosin (CARDURA) 4 mg Oral Tablet    Take 1 Tablet (4 mg total) by mouth Every evening    doxycycline hyclate (VIBRAMYCIN) 100 mg Oral Capsule    Take 1 Capsule (100 mg total) by mouth Twice daily for 5 days    ferric citrate (AURYXIA ORAL)    Take 1 g by mouth Three times daily before meals "Take 2 tablets three times daily before meals and 1 tablet before snacks"    furosemide (LASIX) 80 mg Oral Tablet    Take 2 Tablets (160 mg total) by mouth Once a day Take after dialysis on dialysis days; otherwise take in morning daily.    guaiFENesin (MUCINEX) 600 mg Oral Tablet Extended Release 12hr    Take 1 Tablet (600 mg total) by mouth Every 12 hours    isosorbide mononitrate (IMDUR) 120 mg Oral Tablet Sustained Release 24 hr    Take 1 Tablet (120 mg total) by mouth Every morning for 30 days    labetaloL (NORMODYNE) 300 mg Oral Tablet    Take 1 Tablet (300 mg  total) by mouth Every 12 hours for 30 days    loratadine (CLARITIN) 10 mg Oral Tablet    Take 1 Tablet (10 mg total) by mouth Once a day    montelukast (SINGULAIR) 10 mg Oral Tablet    Take 1 Tablet (10 mg total) by mouth Every evening    nitroGLYCERIN (NITROSTAT) 0.4 mg Sublingual Tablet, Sublingual    Place 1 Tablet (0.4 mg total) under the tongue Every 5 minutes for 3 doses for 3 doses over 15 minutes    ondansetron (ZOFRAN) 4 mg Oral Tablet    Take 1 Tablet (4 mg total) by mouth Every 12 hours as needed for Nausea/Vomiting    pantoprazole (PROTONIX) 40 mg Oral Tablet, Delayed Release (E.C.)    Take 1 Tablet (40 mg total) by mouth Once a day          acetaminophen (TYLENOL) tablet, 650 mg, Oral, Q4H PRN  alcohol 62 % (NOZIN NASAL SANITIZER) nasal solution, 1 Each, Each Nostril, 2x/day  [Held by provider] apixaban (ELIQUIS) tablet, 2.5 mg, Oral, 2x/day  budesonide-formoterol (SYMBICORT) 160 mcg-4.5 mcg per inhalation oral inhaler - "Respiratory to administer", 2 Puff, Inhalation, 2x/day  [START ON 10/02/2021] calcitriol (ROCALTROL) capsule, 1 mcg, Oral, M, W, and F  cinacalcet (SENSIPAR) tablet, 30 mg, Oral, QPM  [START ON 10/04/2021] cloNIDine (CATAPRES-TTS) transdermal patch 0.3 mg, 0.3 mg, Transdermal, Q7 Days  clopidogrel (PLAVIX) 75 mg tablet, 75 mg, Oral, Daily  cyclobenzaprine (FLEXERIL) tablet, 5 mg, Oral, 2x/day PRN  dilTIAZem (CARDIZEM CD) 24 hr extended release capsule, 180 mg, Oral, Daily  diphenhydrAMINE (BENADRYL) capsule, 50 mg, Oral, Q6H PRN  doxazosin (CARDURA) tablet, 4 mg, Oral, QPM  furosemide (LASIX) tablet, 160 mg, Oral, DIALYSIS DAILY  guaiFENesin (MUCINEX) extended release tablet - for cough (expectorant), 600 mg, Oral, 2x/day  isosorbide mononitrate (IMDUR) 24 hr extended release tablet, 120 mg, Oral, QAM  labetalol (NORMODYNE) tablet, 300 mg, Oral, 2x/day  loratadine (CLARITIN) tablet, 10 mg, Oral, Daily  montelukast (SINGULAIR) 10 mg tablet, 10 mg, Oral, QPM  niCARdipine (CARDENE) 25 mg in  NS 250 mL (tot vol) infusion, 5 mg/hr, Intravenous, Continuous  ondansetron (ZOFRAN) 2 mg/mL injection, 4 mg, Intravenous, Q8H PRN  oxyCODONE-acetaminophen (PERCOCET) 5-325mg  per tablet, 1 Tablet, Oral, Q6H PRN  pantoprazole (PROTONIX) delayed  release tablet, 40 mg, Oral, Daily      Allergies   Allergen Reactions    Lisinopril Swelling     Tongue and throat swelling    Oxycodone Itching       ROS:   Constitutional: negative for fevers, chills, sweats, and fatigue  Eyes: negative for visual disturbance, irritation, redness, and icterus  Ears, nose, mouth, throat, and face: negative for hearing loss, tinnitus, ear drainage, nasal congestion, epistaxis, and sore throat  Respiratory: negative for cough, sputum, hemoptysis, wheezing, or dyspnea on exertion  Cardiovascular: negative for chest pain, palpitations, , orthopnea, paroxysmal nocturnal dyspnea, and lower extremity edema  Gastrointestinal: negative for  nausea, vomiting, melena, diarrhea, constipation, and abdominal pain  Genitourinary:negative for frequency, dysuria, nocturia, urinary incontinence, hesitancy, and hematuria  Integument/breast: negative for rash, skin lesion(s), and pruritus  Hematologic/lymphatic: negative for easy bruising, bleeding, and petechiae  Musculoskeletal:negative for myalgias, arthralgias, neck pain, back pain, and muscle weakness  Neurological: negative for headaches, dizziness, seizures, speech problems, tremor, and weakness  Behavioral/Psych: negative for anxiety, behavior problems, mood swings, and sleep disturbance  Endocrine: negative for temperature intolerance  Allergic/Immunologic: negative for urticaria and angioedema      EXAM:  Filed Vitals:    10/01/21 1600 10/01/21 1630 10/01/21 1700 10/01/21 1730   BP: (!) 148/93  (!) 143/87    Pulse: 85 82 84 81   Resp: (!) 22 18 16 13    Temp:       SpO2: 95% 100% 92% 94%       Constitutional: Alert and Oriented time person and place . Not in acute distress  HEENT : Vision and hearing  grossly normal   Eyes: Conjunctiva clear., Pupils equal and round, reactive to light and accomodation. , Sclera non-icteric.   ENT: Nose without erythema. , Mouth mucous membranes moist.   Neck: JVD negative no thyromegaly or lymphadenopathy and supple, symmetrical, trachea midline  Respiratory: Clear to auscultation bilaterally. No wheezes, No rales, Good air exchange bilaterally  Cardiovascular: regular rate and rhythm no murmurs no rub  Gastrointestinal: Soft, non-tender, Bowel sounds normal, No hepatosplenomegaly  Genitourinary: no suprapubic tenderness  Lower Extremities : No edema . Pulses + 4 bilateral   Neurologic: Grossly normal. Speech clear.   Psychiatric: Normal mood and affect,   Labs:         CBC Results Differential Results   Recent Labs     09/29/21  0645 09/30/21  1831 10/01/21  0554   WBC 8.9 9.2 7.4   HGB 7.8* 8.3* 7.8*   HCT 24.2* 24.5* 23.5*    Recent Labs     09/28/21  2105 09/29/21  0645 09/30/21  1831   PMNS 79* 72 77*   LYMPHOCYTES 9* 14* 10*   MONOCYTES 8 9 8    EOSINOPHIL 3 4 4    BASOPHILS 1  0.10 2  0.10 2  0.20   PMNABS 8.50* 6.40 7.10   LYMPHSABS 0.90* 1.30 0.90*   MONOSABS 0.80 0.80 0.70   EOSABS 0.40 0.30 0.30      BMP Results Other Chemistries Results   Recent Labs     09/30/21  1831 09/30/21  2117 10/01/21  0627   SODIUM 136 135* 136   POTASSIUM 3.3* 3.5 3.9   CHLORIDE 96* 97* 97*   CO2 30 28 28    BUN 16 20 25    CREATININE 5.06* 5.42* 6.44*   GFR 10* 9* 8*   ANIONGAP 10 10 11     Recent Labs  09/29/21  0645 09/30/21  1831 09/30/21  2117 10/01/21  0627   CALCIUM 10.4* 10.2 9.9 10.0   ALBUMIN  --  4.3 4.1 4.1   MAGNESIUM 2.3  --  2.0 2.2      Liver/Pancreas Enzyme Results Blood Gas     Recent Labs     09/30/21  1831 09/30/21  2117 10/01/21  0627   TOTALPROTEIN 8.3 7.7 7.2   ALBUMIN 4.3 4.1 4.1   AST 13 12* 13   ALT <7* <7* <7*   ALKPHOS 75 71 71    No results found for this encounter   Cardiac Results    Coags Results     TROPONIN I  Lab Results   Component Value Date     UHCEASTTROPI 0.06 ([place]) 12/13/2020    TROPONINI 72 (H) 10/01/2021    TROPONINI 72 (H) 09/30/2021    TROPONINI 64 (H) 09/30/2021         No results for input(s): INR in the last 72 hours.    Invalid input(s): PTT, PT       Imaging Studies:    XR AP MOBILE CHEST   Final Result   Findings likely represent pulmonary edema with overall aeration unchanged compared with 09/28/2021         Radiologist location ID: [id number]              Assessment/Plan:  Active Hospital Problems    Diagnosis    Primary Problem: Hypertension    ESRD (end stage renal disease) (CMS HCC)    Anemia in chronic kidney disease    LVH (left ventricular hypertrophy)    Elevated troponin I level    Congestive heart failure (CMS HCC)    Chronic chest pain    Uncontrolled hypertension    COPD (chronic obstructive pulmonary disease) (CMS HCC)      End-stage renal disease hemodialysis Monday Wednesday Friday no indication for dialysis today plan for dialysis tomorrow  Anemia of chronic kidney disease will be given blood during dialysis tomorrow will check PTH anemia profile  Chest pain seen by Cardiology for possible heart catheterization tomorrow  Essential hypertension blood pressure poorly controlled adjust medications      Richardean Sale, MD

## 2021-10-01 NOTE — Care Plan (Signed)
PT REMAINS IN ICU. NITROGLYCERIN DRIP WAS DISCONTINUED AND CARDENE DRIP STARTED. ZOFRAN STARTED FOR NAUSEA. SHE REMAINS AFEBRILE. WILL RECEIVE ONE UNIT OF PRBC'S TOMORROW WITH DIALYSIS. PLAN IS TO HAVE HEART CATH ON THURSDAY.FAMILY IN TO VISIT. PT AND FAMILY UPDATED.

## 2021-10-01 NOTE — Progress Notes (Signed)
[facility]  IP PROGRESS NOTE      Date of Admission:  09/30/2021  Hospital Day:  LOS: 1 day       Subjective:     Chest pain.  Headache.    Vitals:  Temp (24hrs) Max:37.3 C (99.2 F)      Temperature: 36.2 C (97.2 F)  BP (Non-Invasive): (!) 202/118  MAP (Non-Invasive): 139 mmHG  Heart Rate: 81  Respiratory Rate: 14  SpO2: 96 %    I/O:  I/O last 24 hours:    Intake/Output Summary (Last 24 hours) at 10/01/2021 0900  Last data filed at 10/01/2021 8786  Gross per 24 hour   Intake 123.12 ml   Output --   Net 123.12 ml       PE:  General: NAD  HEENT:  ETT in place, no thrush, no scleral icterus, no conjunctival pallor  Cardiovascular:  Regular rate, normal S1 and S2, no murmurs or extra heart sounds, 2+ radial pulses B/L,  Respiratory:  No increased work of breathing, good air movement throughout, no wheezes or crackles  Abdomen: Soft, non-tender, non-distended  Extremities: No lower extremity edema  Skin: Warm, no rashes  Neurologic: Symmetric gaze, no facial asymmetry, interactive  Lines/tubes/drains:     Current Meds:  acetaminophen (TYLENOL) tablet, 650 mg, Oral, Q4H PRN  alcohol 62 % (NOZIN NASAL SANITIZER) nasal solution, 1 Each, Each Nostril, 2x/day  diphenhydrAMINE (BENADRYL) capsule, 50 mg, Oral, Q6H PRN  morphine 4 mg/mL injection, 4 mg, Intravenous, Q6H PRN  niCARdipine (CARDENE) 25 mg in NS 250 mL (tot vol) infusion, 5 mg/hr, Intravenous, Continuous  nitroGLYCERIN 50 mg in 250 ml D5W premix infusion, 5 mcg/min, Intravenous, Continuous  oxyCODONE-acetaminophen (PERCOCET) 5-389m per tablet, 1 Tablet, Oral, Q6H PRN        Labs:  Reviewed:   Results for orders placed or performed during the hospital encounter of 09/30/21 (from the past 24 hour(s))   URINE CULTURE,ROUTINE    Specimen: Urine, Clean Catch    Narrative    CONTINUING TO HOLD     COMPREHENSIVE METABOLIC PANEL, NON-FASTING   Result Value Ref Range    SODIUM 136 136 - 145 mmol/L    POTASSIUM 3.3 (L) 3.5 - 5.1 mmol/L    CHLORIDE 96 (L)  98 - 107 mmol/L    CO2 TOTAL 30 21 - 31 mmol/L    ANION GAP 10 10 - 20 mmol/L    BUN 16 7 - 25 mg/dL    CREATININE 5.06 (H) 0.60 - 1.30 mg/dL    BUN/CREA RATIO 3 (L) 6 - 22    ESTIMATED GFR 10 (L) >59 mL/min/1.723m    ALBUMIN 4.3 3.5 - 5.7 g/dL    CALCIUM 10.2 8.6 - 10.3 mg/dL    GLUCOSE 103 74 - 109 mg/dL    ALKALINE PHOSPHATASE 75 34 - 104 U/L    ALT (SGPT) <7 (L) 7 - 52 U/L    AST (SGOT) 13 13 - 39 U/L    BILIRUBIN TOTAL 1.0 0.3 - 1.2 mg/dL    PROTEIN TOTAL 8.3 6.4 - 8.9 g/dL    ALBUMIN/GLOBULIN RATIO 1.1 0.8 - 1.4    OSMOLALITY, CALCULATED 273 270 - 290 mOsm/kg    CALCIUM, CORRECTED 9.9 8.9 - 10.8 mg/dL    GLOBULIN 4.0 2.9 - 5.4    Narrative    Estimated Glomerular Filtration Rate (eGFR) is calculated using the CKD-EPI (2021) equation, intended for patients 1880ears of age and older. If gender is  not documented or "unknown", there will be no eGFR calculation.   CBC/DIFF    Narrative    The following orders were created for panel order CBC/DIFF.  Procedure                               Abnormality         Status                     ---------                               -----------         ------                     CBC WITH MWUX[[id number]]                Abnormal            Final result                 Please view results for these tests on the individual orders.   TROPONIN NOW   Result Value Ref Range    TROPONIN I 67 (H) <15 ng/L   TROPONIN IN ONE HOUR   Result Value Ref Range    TROPONIN I 64 (H) <15 ng/L   TROPONIN IN THREE HOURS   Result Value Ref Range    TROPONIN I 72 (H) <15 ng/L   CBC WITH DIFF   Result Value Ref Range    WBCS UNCORRECTED 9.2 x10^3/uL    WBC 9.2 4.0 - 10.5 x10^3/uL    RBC 2.79 (L) 4.20 - 5.40 x10^6/uL    HGB 8.3 (L) 12.5 - 16.0 g/dL    HCT 24.5 (L) 37.0 - 47.0 %    MCV 87.8 78.0 - 99.0 fL    MCH 29.6 27.0 - 32.0 pg    MCHC 33.7 32.0 - 36.0 g/dL    RDW 20.6 (H) 11.6 - 14.8 %    PLATELETS 192 140 - 440 x10^3/uL    MPV 6.6 (L) 7.4 - 10.4 fL    NEUTROPHIL % 77 (H) 40 - 76 %    LYMPHOCYTE  % 10 (L) 25 - 45 %    MONOCYTE % 8 0 - 12 %    EOSINOPHIL % 4 0 - 7 %    BASOPHIL % 2 0 - 3 %    NEUTROPHIL # 7.10 1.80 - 8.40 x10^3/uL    LYMPHOCYTE # 0.90 (L) 1.10 - 5.00 x10^3/uL    MONOCYTE # 0.70 0.00 - 1.30 x10^3/uL    EOSINOPHIL # 0.30 0.00 - 0.80 x10^3/uL    BASOPHIL # 0.20 0.00 - 0.30 x10^3/uL   URINALYSIS, MACROSCOPIC AND MICROSCOPIC W/CULTURE REFLEX [PRN ONLY]    Specimen: Urine, Clean Catch    Narrative    The following orders were created for panel order URINALYSIS, MACROSCOPIC AND MICROSCOPIC W/CULTURE REFLEX [PRN ONLY].  Procedure                               Abnormality         Status                     ---------                               -----------         ------  URINALYSIS, MACROSCOPIC[[id number]]      Abnormal            Final result               URINALYSIS, MICROSCOPIC[[id number]]      Abnormal            Final result                 Please view results for these tests on the individual orders.   URINALYSIS, MACROSCOPIC   Result Value Ref Range    COLOR Yellow Colorless, Light Yellow, Yellow    APPEARANCE Turbid (A) Clear    SPECIFIC GRAVITY 1.007 1.002 - 1.030    PH 7.0 5.0 - 9.0    LEUKOCYTES 500 (A) Negative, 100  WBCs/uL    NITRITE Negative Negative    PROTEIN 50 (A) Negative, 10 , 20  mg/dL    GLUCOSE Negative Negative, 30  mg/dL    KETONES Negative Negative, Trace mg/dL    BILIRUBIN Negative Negative, 0.5 mg/dL    BLOOD 0.03 Negative, 0.03 mg/dL    UROBILINOGEN Normal Normal mg/dL   URINALYSIS, MICROSCOPIC   Result Value Ref Range    MUCOUS Rare (A) (none) /hpf    RBCS 3 <4 /hpf    WBCS 35 (H) <6 /hpf    WHITE BLOOD CELL CLUMP Few (A) (none) /hpf    SQUAMOUS EPITHELIAL 2 <28 /hpf   COMPREHENSIVE METABOLIC PANEL, NON-FASTING   Result Value Ref Range    SODIUM 135 (L) 136 - 145 mmol/L    POTASSIUM 3.5 3.5 - 5.1 mmol/L    CHLORIDE 97 (L) 98 - 107 mmol/L    CO2 TOTAL 28 21 - 31 mmol/L    ANION GAP 10 10 - 20 mmol/L    BUN 20 7 - 25 mg/dL    CREATININE 5.42 (H) 0.60 -  1.30 mg/dL    BUN/CREA RATIO 4 (L) 6 - 22    ESTIMATED GFR 9 (L) >59 mL/min/1.7m2    ALBUMIN 4.1 3.5 - 5.7 g/dL    CALCIUM 9.9 8.6 - 10.3 mg/dL    GLUCOSE 102 74 - 109 mg/dL    ALKALINE PHOSPHATASE 71 34 - 104 U/L    ALT (SGPT) <7 (L) 7 - 52 U/L    AST (SGOT) 12 (L) 13 - 39 U/L    BILIRUBIN TOTAL 0.9 0.3 - 1.2 mg/dL    PROTEIN TOTAL 7.7 6.4 - 8.9 g/dL    ALBUMIN/GLOBULIN RATIO 1.1 0.8 - 1.4    OSMOLALITY, CALCULATED 273 270 - 290 mOsm/kg    CALCIUM, CORRECTED 9.8 8.9 - 10.8 mg/dL    GLOBULIN 3.6 2.9 - 5.4    Narrative    Estimated Glomerular Filtration Rate (eGFR) is calculated using the CKD-EPI (2021) equation, intended for patients 18years of age and older. If gender is not documented or "unknown", there will be no eGFR calculation.   MAGNESIUM   Result Value Ref Range    MAGNESIUM 2.0 1.9 - 2.7 mg/dL   CBC   Result Value Ref Range    WBCS UNCORRECTED 7.4 x10^3/uL    WBC 7.4 4.0 - 10.5 x10^3/uL    RBC 2.67 (L) 4.20 - 5.40 x10^6/uL    HGB 7.8 (L) 12.5 - 16.0 g/dL    HCT 23.5 (L) 37.0 - 47.0 %    MCV 88.0 78.0 - 99.0 fL    MCH 29.1 27.0 - 32.0 pg    MCHC 33.1 32.0 -  36.0 g/dL    RDW 20.7 (H) 11.6 - 14.8 %    PLATELETS 200 140 - 440 x10^3/uL    MPV 7.1 (L) 7.4 - 10.4 fL   EXTRA TUBES    Narrative    The following orders were created for panel order EXTRA TUBES.  Procedure                               Abnormality         Status                     ---------                               -----------         ------                     GOLD TOP IRCV[[id number]]                                    In process                   Please view results for these tests on the individual orders.   COMPREHENSIVE METABOLIC PANEL, NON-FASTING   Result Value Ref Range    SODIUM 136 136 - 145 mmol/L    POTASSIUM 3.9 3.5 - 5.1 mmol/L    CHLORIDE 97 (L) 98 - 107 mmol/L    CO2 TOTAL 28 21 - 31 mmol/L    ANION GAP 11 10 - 20 mmol/L    BUN 25 7 - 25 mg/dL    CREATININE 6.44 (H) 0.60 - 1.30 mg/dL    BUN/CREA RATIO 4 (L) 6 - 22    ESTIMATED  GFR 8 (L) >59 mL/min/1.37m2    ALBUMIN 4.1 3.5 - 5.7 g/dL    CALCIUM 10.0 8.6 - 10.3 mg/dL    GLUCOSE 113 (H) 74 - 109 mg/dL    ALKALINE PHOSPHATASE 71 34 - 104 U/L    ALT (SGPT) <7 (L) 7 - 52 U/L    AST (SGOT) 13 13 - 39 U/L    BILIRUBIN TOTAL 0.9 0.3 - 1.2 mg/dL    PROTEIN TOTAL 7.2 6.4 - 8.9 g/dL    ALBUMIN/GLOBULIN RATIO 1.3 0.8 - 1.4    OSMOLALITY, CALCULATED 277 270 - 290 mOsm/kg    CALCIUM, CORRECTED 9.9 8.9 - 10.8 mg/dL    GLOBULIN 3.1 2.9 - 5.4    Narrative    Estimated Glomerular Filtration Rate (eGFR) is calculated using the CKD-EPI (2021) equation, intended for patients 162years of age and older. If gender is not documented or "unknown", there will be no eGFR calculation.   MAGNESIUM   Result Value Ref Range    MAGNESIUM 2.2 1.9 - 2.7 mg/dL          Radiology:  Reviewed:   Results for orders placed or performed during the hospital encounter of 09/30/21 (from the past 24 hour(s))   XR AP MOBILE CHEST     Status: None    Narrative    Amaya Egge    RADIOLOGIST: VSatira Anis MD    XR AP MOBILE CHEST performed on 09/30/2021 6:08 PM    CLINICAL HISTORY: chest pain.  CHEST PAIN  TECHNIQUE: Frontal view of the chest.    COMPARISON:    August July 2023    FINDINGS:    Tunneled right IJ dialysis catheter tip in upper right atrium  Post stenting RA/SVC junction  Stent left axillary region    Mild unchanged cardiomegaly.  Post coronary stenting and/or dense coronary artery calcification  Diffuse indistinctness bronchovascular markings  Diffuse increased attenuation lung parenchyma most pronounced in the mid and lower lung zones bilaterally  No large effusions  No pneumothorax        Impression    Findings likely represent pulmonary edema with overall aeration unchanged compared with 09/28/2021      Radiologist location ID: [id number]             Problem List:  Active Hospital Problems   (*Primary Problem)    Diagnosis    *Hypertension       A/P:  Hypertensive urgency  Non adherence to medical  therapy  ESRD  DM  Chest pain    Change nitro drip to nicardipine  Resume outpt blood pressure meds including imdur  HD per nephrology  No dilaudid   Stop morphine  Apparently was scheduled for outpt cath today.   Will let interventional cards know she is inpatient.  Defer to them re caht now or reschedule as outpt    IS PATIENT CRITICALLY ILL?  Is there a high potential of sudden, clinically significant, or life threatening deterioration? yes  Is there a need for direct personal assessment and management to treat/prevent multiple vital organ failure/deterioration?  yes  If this patient is not critically ill, I certify the patient requires continued in-patient hospitalization         I personally performed 35 min of aggregate critical care time exclusive of procedures and teaching.  This includes time spent during direct patient evaluation and reassessment, interpreting diagnostic tests, directing life and/or organ supporting interventions and documentation on the unit.

## 2021-10-01 NOTE — Respiratory Therapy (Signed)
Patient declined Symbicort MDI at this time d/t chest pain (nursing aware).

## 2021-10-01 NOTE — Nurses Notes (Signed)
Upon patient ringing call bell, patient stated she spit up a blood clot. She still had it in the tissue for nursing staff to examine. Mathews Argyle NP informed at this time.

## 2021-10-01 NOTE — Care Plan (Signed)
Patient admitted to [facility] for hypertension and chest pain. Patient is alert and oriented. Patient is on 2lpm NC at this time. Patient is on continuous cardiac monitoring. Patient has consult to see cardiology for chest pain and is scheduled to have heart catherization in near future per patient. Patient had dialysis yesterday and is on a MWF schedule at this time. Patient is on nitro gtt at 60 mcg/min through 18 g IV to L arm. Education and transition readiness is ongoing.  Problem: Hypertension Comorbidity  Goal: Blood Pressure in Desired Range  Outcome: Ongoing (see interventions/notes)  Intervention: Maintain Blood Pressure Management  Recent Flowsheet Documentation  Taken 10/01/2021 0400 by Forde Radon, RN  Syncope Management: verbally stimulated  Taken 10/01/2021 0015 by Forde Radon, RN  Syncope Management: verbally stimulated     Problem: Adult Inpatient Plan of Care  Goal: Plan of Care Review  Outcome: Ongoing (see interventions/notes)  Goal: Patient-Specific Goal (Individualized)  Outcome: Ongoing (see interventions/notes)  Flowsheets (Taken 10/01/2021 0046)  Patient would like to participate in bedside shift report: Yes  Individualized Care Needs: decrease BP and chest pain  Anxieties, Fears or Concerns: Anxious of chest pain  Patient-Specific Goals (Include Timeframe): keep patient comfortable  Plan of Care Reviewed With: patient  Goal: Absence of Hospital-Acquired Illness or Injury  Outcome: Ongoing (see interventions/notes)  Intervention: Identify and Manage Fall Risk  Recent Flowsheet Documentation  Taken 10/01/2021 0400 by Forde Radon, RN  Safety Promotion/Fall Prevention: fall prevention program maintained  Taken 10/01/2021 0015 by Forde Radon, RN  Safety Promotion/Fall Prevention: fall prevention program maintained  Intervention: Prevent Skin Injury  Recent Flowsheet Documentation  Taken 10/01/2021 0559 by Forde Radon, RN  Body Position:   turned q 2 hours   heels elevated off mattress   semi-fowlers  (30-45 degrees)  Taken 10/01/2021 0400 by Forde Radon, RN  Body Position:   turned q 2 hours   heels elevated off mattress   positioned/repositioned independently  Skin Protection: adhesive use limited  Taken 10/01/2021 0151 by Forde Radon, RN  Body Position:   turned q 2 hours   heels elevated off mattress   semi-fowlers (30-45 degrees)  Taken 10/01/2021 0015 by Forde Radon, RN  Body Position:   turned q 2 hours   positioned/repositioned independently  Skin Protection: adhesive use limited  Intervention: Prevent and Manage VTE (Venous Thromboembolism) Risk  Recent Flowsheet Documentation  Taken 10/01/2021 0400 by Forde Radon, RN  VTE Prevention/Management: anticoagulant therapy maintained  Taken 10/01/2021 0015 by Forde Radon, RN  VTE Prevention/Management: anticoagulant therapy maintained  Intervention: Prevent Infection  Recent Flowsheet Documentation  Taken 10/01/2021 0015 by Forde Radon, RN  Infection Prevention: promote handwashing  Goal: Optimal Comfort and Wellbeing  Outcome: Ongoing (see interventions/notes)  Goal: Rounds/Family Conference  Outcome: Ongoing (see interventions/notes)

## 2021-10-02 DIAGNOSIS — N2581 Secondary hyperparathyroidism of renal origin: Secondary | ICD-10-CM

## 2021-10-02 DIAGNOSIS — E1122 Type 2 diabetes mellitus with diabetic chronic kidney disease: Secondary | ICD-10-CM

## 2021-10-02 DIAGNOSIS — N186 End stage renal disease: Secondary | ICD-10-CM

## 2021-10-02 DIAGNOSIS — I132 Hypertensive heart and chronic kidney disease with heart failure and with stage 5 chronic kidney disease, or end stage renal disease: Secondary | ICD-10-CM

## 2021-10-02 DIAGNOSIS — D631 Anemia in chronic kidney disease: Secondary | ICD-10-CM

## 2021-10-02 DIAGNOSIS — I160 Hypertensive urgency: Secondary | ICD-10-CM

## 2021-10-02 LAB — COMPREHENSIVE METABOLIC PANEL, NON-FASTING
ALBUMIN/GLOBULIN RATIO: 1.2 (ref 0.8–1.4)
ALBUMIN: 3.7 g/dL (ref 3.5–5.7)
ALKALINE PHOSPHATASE: 61 U/L (ref 34–104)
ALT (SGPT): <7 U/L — ABNORMAL LOW (ref 7–52)
ANION GAP: 9 mmol/L — ABNORMAL LOW (ref 10–20)
AST (SGOT): 16 U/L (ref 13–39)
BILIRUBIN TOTAL: 0.6 mg/dL (ref 0.3–1.2)
BUN/CREA RATIO: 4 — ABNORMAL LOW (ref 6–22)
BUN: 35 mg/dL — ABNORMAL HIGH (ref 7–25)
CALCIUM, CORRECTED: 9.5 mg/dL (ref 8.9–10.8)
CALCIUM: 9.2 mg/dL (ref 8.6–10.3)
CHLORIDE: 100 mmol/L (ref 98–107)
CO2 TOTAL: 26 mmol/L (ref 21–31)
CREATININE: 8.51 mg/dL — ABNORMAL HIGH (ref 0.60–1.30)
ESTIMATED GFR: 5 mL/min/{1.73_m2} — ABNORMAL LOW (ref >59)
GLOBULIN: 3.2 (ref 2.9–5.4)
GLUCOSE: 85 mg/dL (ref 74–109)
OSMOLALITY, CALCULATED: 277 mOsm/kg (ref 270–290)
POTASSIUM: 4 mmol/L (ref 3.5–5.1)
PROTEIN TOTAL: 6.9 g/dL (ref 6.4–8.9)
SODIUM: 135 mmol/L — ABNORMAL LOW (ref 136–145)

## 2021-10-02 LAB — IRON TRANSFERRIN AND TIBC
IRON (TRANSFERRIN) SATURATION: 14 % — ABNORMAL LOW (ref 15–50)
IRON: 23 ug/dL — ABNORMAL LOW (ref 50–212)
TOTAL IRON BINDING CAPACITY: 168 ug/dL — ABNORMAL LOW (ref 250–450)
TRANSFERRIN: 120 mg/dL — ABNORMAL LOW (ref 203–362)
UIBC: 145 ug/dL (ref 130–375)

## 2021-10-02 LAB — PARATHYROID HORMONE (PTH): PTH: 148.1 pg/mL — ABNORMAL HIGH (ref 12.0–88.0)

## 2021-10-02 LAB — CBC
HCT: 21.6 % — ABNORMAL LOW (ref 37.0–47.0)
HGB: 7 g/dL — ABNORMAL LOW (ref 12.5–16.0)
MCH: 28.6 pg (ref 27.0–32.0)
MCHC: 32.6 g/dL (ref 32.0–36.0)
MCV: 88 fL (ref 78.0–99.0)
MPV: 6.8 fL — ABNORMAL LOW (ref 7.4–10.4)
PLATELETS: 209 10*3/uL (ref 140–440)
RBC: 2.45 10*6/uL — ABNORMAL LOW (ref 4.20–5.40)
RDW: 19.5 % — ABNORMAL HIGH (ref 11.6–14.8)
WBC: 8 10*3/uL (ref 4.0–10.5)
WBCS UNCORRECTED: 8 10*3/uL

## 2021-10-02 LAB — VITAMIN B12: VITAMIN B 12: 286 pg/mL (ref 180–914)

## 2021-10-02 LAB — FOLATE: FOLATE: 8.1 ng/mL (ref 5.9–24.4)

## 2021-10-02 LAB — MAGNESIUM: MAGNESIUM: 2.1 mg/dL (ref 1.9–2.7)

## 2021-10-02 LAB — FERRITIN: FERRITIN: 626 ng/mL — ABNORMAL HIGH (ref 11–336)

## 2021-10-02 MED ORDER — CYANOCOBALAMIN (VIT B-12) 250 MCG TABLET
1000.0000 ug | ORAL_TABLET | Freq: Every day | ORAL | Status: DC
Start: 2021-10-02 — End: 2021-10-03
  Administered 2021-10-02: 1000 ug via ORAL
  Filled 2021-10-02: qty 4

## 2021-10-02 MED ORDER — EPOETIN ALFA-EPBX 10,000 UNIT/ML INJECTION SOLUTION
10000.0000 [IU] | INTRAMUSCULAR | Status: DC
Start: 2021-10-02 — End: 2021-10-03
  Administered 2021-10-02: 10000 [IU] via INTRAVENOUS
  Filled 2021-10-02: qty 1

## 2021-10-02 MED ORDER — IRON SUCROSE 100 MG IRON/5 ML INTRAVENOUS SOLUTION
100.0000 mg | INTRAVENOUS | Status: DC
Start: 2021-10-02 — End: 2021-10-03
  Administered 2021-10-02: 100 mg via INTRAVENOUS
  Filled 2021-10-02: qty 5

## 2021-10-02 MED ORDER — FOLIC ACID 1 MG TABLET
1.0000 mg | ORAL_TABLET | Freq: Every day | ORAL | Status: DC
Start: 2021-10-02 — End: 2021-10-03
  Administered 2021-10-02: 1 mg via ORAL
  Filled 2021-10-02: qty 1

## 2021-10-02 MED ORDER — SODIUM CHLORIDE 0.9 % IV BOLUS
40.0000 mL | INJECTION | Freq: Once | Status: AC | PRN
Start: 2021-10-02 — End: 2021-10-02

## 2021-10-02 NOTE — Consults (Signed)
[place]    Date of Service:  10/02/2021  [name]   46 y.o. female  Date of Admission:  09/30/2021  Date of Birth:  1975-07-24  2     Reason for Consultation:  Chest pain and elevated troponin I        History of Present Illness:  [name] is a 46 y.o. 38 American female who presents with Chest pain.  Patient was on dialysis and started having chest pain.  Patient was seen emergency room for chest pain for whole day.  She described as a burning pain in her chest and epigastric area becomes very uncomfortable anxious nervous.  Also complains of shortness with nausea.  Feels tired fatigue.  Labs shows troponin I 72 but did not have a rise or fall but mostly plateaued.  EKG sinus rhythm with LVH and secondary ST and T wave changes.  Her blood pressure was extremely high when she came in and it kept going up.  Apparently she has not been able to hold her medication down because of nausea.  She was started on nicardipine drip and blood pressure controlled.  She was given morphine without much relief currently she is on dialysis and still have chest pain some soreness.  Patient has multiple multiple hospitalizations for uncontrolled hypertension, elevated troponin I, chest pain and volume overload.  Sometimes she is noncompliant with dialysis.  But she often stopped taking medication because nausea vomiting and then she comes with high blood pressure.  Never had a coronary stenting or intervention.  She has a cardiac catheterization several years ago which did not show any significant coronary disease.  Chest x-ray showed calcification of coronary arteries.  An echo showed LVH with pulmonary hypertension LV function normal.  I will see her in the office.  Recently she was seen again for recurrent chest pains and decided to the cardiac catheterization to make a definitive diagnosis to prevent this recurrent hospitalizations.    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 She has a 10.00 pack-year smoking history. She has quit using smokeless tobacco. She reports that she does not currently use alcohol. She reports current drug use. Drug: Marijuana.     REVIEW OF SYSTEMS:  All systems have been reviewed and found to be negative except for as stated above in the history of present illness.     ROS       Allergies   Allergen Reactions    Lisinopril Swelling     Tongue and throat swelling    Oxycodone Itching       Medications:  Medications Prior to Admission       Prescriptions    acetaminophen (TYLENOL) 325 mg Oral Tablet    Take 2 Tablets (650 mg total) by mouth Every 4 hours as needed    albuterol sulfate (PROVENTIL OR VENTOLIN OR PROAIR) 90 mcg/actuation Inhalation oral inhaler    Take 1-2 Puffs by inhalation Every 6 hours as needed    apixaban (ELIQUIS) 2.5 mg Oral Tablet    Take 1 Tablet  (2.5 mg total) by mouth Twice daily for 41 doses    budesonide-formoteroL (SYMBICORT) 160-4.5 mcg/actuation Inhalation oral inhaler    Take 2 Puffs by inhalation Twice daily    calcitrioL (ROCALTROL) 0.5 mcg Oral Capsule    Take 2 Capsules (1 mcg total) by mouth Every Monday, Wednesday and Friday    cinacalcet (SENSIPAR) 30 mg Oral Tablet    Take 1 Tablet (30 mg total) by mouth Every evening    cloNIDine (CATAPRES-TTS) 0.3 mg/24 hr Transdermal Patch Weekly    Place 1 Patch (0.3 mg total) on the skin Every 7 days    cloNIDine HCL (CATAPRES) 0.2 mg Oral Tablet    Take 1 Tablet (0.2 mg total) by mouth Twice daily for 30 days    clopidogreL (PLAVIX) 75 mg Oral Tablet    Take 1 Tablet (75 mg total) by mouth Once a day    cyclobenzaprine (FLEXERIL) 5 mg Oral Tablet    Take 1 Tablet (5 mg total) by mouth Twice per day as needed for Muscle spasms    dilTIAZem (CARDIZEM CD) 180 mg Oral Capsule, Sust. Release 24 hr    Take 1 Capsule (180 mg total) by mouth Once a day for 30 days    doxazosin (CARDURA) 4 mg Oral Tablet    Take 1 Tablet (4 mg total) by mouth Every evening    doxycycline hyclate (VIBRAMYCIN) 100 mg Oral Capsule    Take 1 Capsule (100 mg total) by mouth Twice daily for 5 days    ferric citrate (AURYXIA ORAL)    Take 1 g by mouth Three times daily before meals "Take 2 tablets three times daily before meals and 1 tablet before snacks"    furosemide (LASIX) 80 mg Oral Tablet    Take 2 Tablets (160 mg total) by mouth Once a day Take after dialysis on dialysis days; otherwise take in morning daily.    guaiFENesin (MUCINEX) 600 mg Oral Tablet Extended Release 12hr    Take 1 Tablet (600 mg total) by mouth Every 12 hours    isosorbide mononitrate (IMDUR) 120 mg Oral Tablet Sustained Release 24 hr    Take 1 Tablet (120 mg total) by mouth Every morning for 30 days    labetaloL (NORMODYNE) 300 mg Oral Tablet    Take  1 Tablet (300 mg total) by mouth Every 12 hours for 30 days    loratadine (CLARITIN) 10 mg Oral Tablet     Take 1 Tablet (10 mg total) by mouth Once a day    montelukast (SINGULAIR) 10 mg Oral Tablet    Take 1 Tablet (10 mg total) by mouth Every evening    nitroGLYCERIN (NITROSTAT) 0.4 mg Sublingual Tablet, Sublingual    Place 1 Tablet (0.4 mg total) under the tongue Every 5 minutes for 3 doses for 3 doses over 15 minutes    ondansetron (ZOFRAN) 4 mg Oral Tablet    Take 1 Tablet (4 mg total) by mouth Every 12 hours as needed for Nausea/Vomiting    pantoprazole (PROTONIX) 40 mg Oral Tablet, Delayed Release (E.C.)    Take 1 Tablet (40 mg total) by mouth Once a day          acetaminophen (TYLENOL) tablet, 650 mg, Oral, Q4H PRN  alcohol 62 % (NOZIN NASAL SANITIZER) nasal solution, 1 Each, Each Nostril, 2x/day  [Held by provider] apixaban (ELIQUIS) tablet, 2.5 mg, Oral, 2x/day  budesonide-formoterol (SYMBICORT) 160 mcg-4.5 mcg per inhalation oral inhaler - "Respiratory to administer", 2 Puff, Inhalation, 2x/day  calcitriol (ROCALTROL) capsule, 1 mcg, Oral, M, W, and F  cinacalcet (SENSIPAR) tablet, 30 mg, Oral, QPM  [START ON 10/04/2021] cloNIDine (CATAPRES-TTS) transdermal patch 0.3 mg, 0.3 mg, Transdermal, Q7 Days  clopidogrel (PLAVIX) 75 mg tablet, 75 mg, Oral, Daily  cyanocobalamin (VITAMIN B12) tablet, 1,000 mcg, Oral, Daily with Lunch  cyclobenzaprine (FLEXERIL) tablet, 5 mg, Oral, 2x/day PRN  dilTIAZem (CARDIZEM CD) 24 hr extended release capsule, 180 mg, Oral, Daily  diphenhydrAMINE (BENADRYL) capsule, 50 mg, Oral, Q6H PRN  doxazosin (CARDURA) tablet, 4 mg, Oral, QPM  epoetin alfa-epbx (RETACRIT) 10,000 units/mL injection, 10,000 Units, Intravenous, Give in Dialysis  folic acid (FOLVITE) tablet, 1 mg, Oral, Daily  furosemide (LASIX) tablet, 160 mg, Oral, DIALYSIS DAILY  guaiFENesin (MUCINEX) extended release tablet - for cough (expectorant), 600 mg, Oral, 2x/day  iron sucrose (VENOFER) 100 mg/5 mL injection, 100 mg, Intravenous, Give in Dialysis  isosorbide mononitrate (IMDUR) 24 hr extended release tablet, 120 mg,  Oral, QAM  labetalol (NORMODYNE) tablet, 300 mg, Oral, 2x/day  loratadine (CLARITIN) tablet, 10 mg, Oral, Daily  montelukast (SINGULAIR) 10 mg tablet, 10 mg, Oral, QPM  NS bolus infusion 40 mL, 40 mL, Intravenous, Once PRN  ondansetron (ZOFRAN) 2 mg/mL injection, 4 mg, Intravenous, Q8H PRN  oxyCODONE-acetaminophen (PERCOCET) 5-325mg  per tablet, 1 Tablet, Oral, Q6H PRN  pantoprazole (PROTONIX) delayed release tablet, 40 mg, Oral, Daily        Physical Exam:  Physical Exam  Constitutional:       General: She is not in acute distress.     Appearance: Normal appearance. She is not toxic-appearing or diaphoretic.   HENT:      Head: Normocephalic.      Mouth/Throat:      Mouth: Mucous membranes are moist.   Neck:      Vascular: No carotid bruit.   Cardiovascular:      Rate and Rhythm: Normal rate and regular rhythm.      Pulses: Normal pulses.      Heart sounds: Murmur heard.     No gallop.      Comments: Ejection systolic murmur at aortic area.  Loud S4  Pulmonary:      Effort: Pulmonary effort is normal. No respiratory distress.      Breath sounds: Normal breath sounds. No  wheezing or rales.   Abdominal:      General: Abdomen is flat. Bowel sounds are normal. There is no distension.      Palpations: Abdomen is soft. There is no mass.      Tenderness: There is abdominal tenderness. There is no guarding.   Musculoskeletal:      Cervical back: Neck supple. No rigidity.      Right lower leg: No edema.      Left lower leg: No edema.   Skin:     General: Skin is warm and dry.      Coloration: Skin is not jaundiced.      Findings: No bruising.   Neurological:      Mental Status: She is alert and oriented to person, place, and time.   Psychiatric:         Mood and Affect: Mood normal.      Comments: Anxious and restless        Vitals:  Temperature: 36.6 C (97.8 F)  Heart Rate: 79  Respiratory Rate: 16  BP (Non-Invasive): (!) 146/96  SpO2: 90 %    vitals reviewed.     BMP:    Basic Metabolic Profile    Lab Results   Component  Value Date/Time    SODIUM 135 (L) 10/02/2021 06:10 AM    POTASSIUM 4.0 10/02/2021 06:10 AM    CHLORIDE 100 10/02/2021 06:10 AM    CO2 26 10/02/2021 06:10 AM    ANIONGAP 9 (L) 10/02/2021 06:10 AM    Lab Results   Component Value Date/Time    BUN 35 (H) 10/02/2021 06:10 AM    CREATININE 8.51 (H) 10/02/2021 06:10 AM    GLUCOSENF 85 10/02/2021 06:10 AM          CMP: @LASTCMP @   CBC: COMPLETE BLOOD COUNT   Lab Results   Component Value Date    WBC 8.0 10/02/2021    HGB 7.0 (L) 10/02/2021    HCT 21.6 (L) 10/02/2021    PLTCNT 209 10/02/2021       DIFFERENTIAL  Lab Results   Component Value Date    PMNS 77 (H) 09/30/2021    LYMPHOCYTES 10 (L) 09/30/2021    MONOCYTES 8 09/30/2021    EOSINOPHIL 4 09/30/2021    BASOPHILS 2 09/30/2021    BASOPHILS 0.20 09/30/2021    PMNABS 7.10 09/30/2021    LYMPHSABS 0.90 (L) 09/30/2021    EOSABS 0.30 09/30/2021    MONOSABS 0.70 09/30/2021        Hepatic Function:   Hepatic Function    Lab Results   Component Value Date/Time    ALBUMIN 3.7 10/02/2021 06:10 AM    TOTALPROTEIN 6.9 10/02/2021 06:10 AM    ALKPHOS 61 10/02/2021 06:10 AM    PROTHROMTME 12.6 08/26/2021 04:25 AM    INR 1.19 (H) 08/26/2021 04:25 AM    Lab Results   Component Value Date/Time    AST 16 10/02/2021 06:10 AM    ALT <7 (L) 10/02/2021 06:10 AM    BILIRUBINCON 0.2 08/12/2021 05:23 PM             Magnesium: MAGNESIUM  Lab Results   Component Value Date    MAGNESIUM 2.1 10/02/2021        PT: COAGULATION TESTS  Lab Results   Component Value Date    PROTHROMTME 12.6 08/26/2021    APTT 86.2 (H) 08/15/2021    INR 1.19 (H) 08/26/2021    HEMOGLOBIN 14.6 08/12/2021  HCT 21.6 (L) 10/02/2021    PLTCNT 209 10/02/2021          INR:   No results found for this encounter  PTT:   No results found for this encounter  TROPONIN I  Lab Results   Component Value Date    UHCEASTTROPI 0.06 ([place]) 12/13/2020    TROPONINI 72 (H) 10/01/2021    TROPONINI 72 (H) 09/30/2021    TROPONINI 64 (H) 09/30/2021          Lab Results   Component Value Date    BNP  1,719 (H) 08/12/2021        Last 3 Cardiac Markers: No results found for this encounter  Blood Gas: No results found for this encounter  Lipid Panel:  No results found for this encounter  TSH:  No results found for this encounter    Assessment:  Type II myocardial infarction.  Chronic chest pain.  Moderate LVH.  Primary hypertension.  Coronary artery calcification.           Plan:   Patient with multiple risk factor for coronary artery disease have this chronic and worsening chest pain requiring multiple hospitalizations.  Elevated troponin I without rise or fall likely type II myocardial infarction and LVH and extremely high blood pressure.  Possible obstructive coronary artery disease.  Because of recurrent hospitalizations and patient discomfort and unclear diagnosis, I believe patient is cardiac catheterization.  She has chronic anemia for which she is getting blood transfusion today.  Eliquis on hold because of anticipation of coronary angiogram.  I discussed with the patient and she is agreeable.  Patient will be scheduled for coronary angiogram tomorrow.  Blood pressure much better controlled now when she started taking her home medication.    Sherlyn Lees, MD, [facility]      This note was partially generated using MModal Fluency Direct system, and there may be some incorrect words, spellings, and punctuation that were not noted in checking the note before saving.

## 2021-10-02 NOTE — Nurses Notes (Signed)
Pt transferred to room 322A via wheelchair. On telemetry monitor and O2 at 2l/min NC. Pt alert,awake and oriented. ALL belongings with pt. Cell phone x2 and small ipad and charge cord on pt bedside table. Call bell in hand. [facility] nurse,Crystal in room with pt.

## 2021-10-02 NOTE — Transitional Care (Cosign Needed)
ICU COURSE    09/30/21-10/02/21    Pt was admitted to ccu for cp requiring NTG gtt. She had hypertensive urgency as well. Morphine was ordered prn for cp. Trop range 60-72. Pt reported she was supposed to have outpt cardiac cath with Dr. Viann Shove on 10/03/21. Dr. Viann Shove was consulted and is planning diagnostic cath tomorrow on the originally planned date. Her NTG gtt was DC and Imdur was resumed. BP control was achieved with nicardipine gtt and resumption of her po home BP meds. She was continued on hd per her regular mwf schedule. She had been taking iv Vanc during hd due to bacteremia that developed during recent admission at [facility] and this medication appeared on her home med list, however, she completed the last dose during 8/7 HD session. Clinically the pt appears stable for transfer to tele for ongoing care. Eliquis is on hold and she is npo after midnight tonight for cardiac cath in am. Please refer to today's progress notes for complete details.     The Hospitalist personally evaluated and examined the patient in conjunction with the MLP and agree with the assessments, treatment plan and disposition of the patient as recorded by the [facility].

## 2021-10-02 NOTE — Consults (Signed)
[facility]   Nephrology Consult      [place], [place], 46 y.o. female  Encounter Start Date:  09/30/2021  Inpatient Admission Date:  09/30/2021  Date of Birth:  26-Nov-1975    Admitting Provider:  Hospitalist service    Reason for Consult:  End-stage renal disease on hemodialysis    HPI:  [name] is a 46 y.o. 13 American female with history of chest pain all day today.  She is on dialysis and was admitted over the past weekend for chest pain and HTN. She signed out AMA yesterday because 'I was lying there and nobody was doing anything.'  She developed left chest pain this morning admitted to be seen by Cardiology rule out MI patient was seen by Cardiology may have a heart catheterization tomorrow  End-stage renal disease hemodialysis Monday Wednesday Friday with anemia of chronic kidney disease no indication for dialysis today electrolytes within normal not fluid overload next dialysis tomorrow will give her 1 unit of blood because her hemoglobin below 8 check PTH anemia profile treatment per protocol  Essential hypertension keep blood pressure controlled will adjust medications   10-02-2021  Nephrology progress note on this patient within the surgeon of disease hemodialysis Monday Wednesday Friday dialyzed this morning tolerated dialysis very well continue dialysis as scheduled admitted with chest pain seen by Cardiology workup in progress anemia of chronic kidney disease given blood today hemoglobin 7 also anemia profile showed low iron low folic acid low vitamin B12 started on supplements of Benefiber Epogen folic acid and vitamin B12 supplements also secondary hyperparathyroidism started on calcium Rocaltrol continue dialysis as scheduled keep blood pressure and blood sugar control    Past Medical History:   Diagnosis Date    Asthma     Chronic diastolic CHF (congestive heart failure) (CMS HCC)     Dependence on supplemental oxygen     Esophageal reflux     ESRD (end stage renal  disease) (CMS HCC)     History of anemia due to CKD     MI (myocardial infarction) (CMS HCC)     Mitral valve regurgitation     Pulmonary edema     Sleep apnea     SVC syndrome     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    Tricuspid valve regurgitation     Uncontrolled hypertension          Medications Prior to Admission       Prescriptions    acetaminophen (TYLENOL) 325 mg Oral Tablet    Take 2 Tablets (650 mg total) by mouth Every 4 hours as needed    albuterol sulfate (PROVENTIL OR VENTOLIN OR PROAIR) 90 mcg/actuation Inhalation oral inhaler    Take 1-2 Puffs by inhalation Every 6 hours as needed    apixaban (ELIQUIS) 2.5 mg Oral Tablet    Take 1 Tablet (2.5 mg total) by mouth Twice daily for 41 doses    budesonide-formoteroL (SYMBICORT) 160-4.5 mcg/actuation Inhalation oral inhaler    Take 2 Puffs by inhalation Twice daily    calcitrioL (ROCALTROL) 0.5 mcg Oral Capsule    Take 2 Capsules (1 mcg total) by mouth Every Monday, Wednesday and Friday    cinacalcet (SENSIPAR) 30 mg Oral Tablet    Take 1 Tablet (30 mg total) by mouth Every evening    cloNIDine (CATAPRES-TTS) 0.3 mg/24 hr Transdermal Patch Weekly    Place 1 Patch (0.3 mg total) on the skin Every 7  days    cloNIDine HCL (CATAPRES) 0.2 mg Oral Tablet    Take 1 Tablet (0.2 mg total) by mouth Twice daily for 30 days    clopidogreL (PLAVIX) 75 mg Oral Tablet    Take 1 Tablet (75 mg total) by mouth Once a day    cyclobenzaprine (FLEXERIL) 5 mg Oral Tablet    Take 1 Tablet (5 mg total) by mouth Twice per day as needed for Muscle spasms    dilTIAZem (CARDIZEM CD) 180 mg Oral Capsule, Sust. Release 24 hr    Take 1 Capsule (180 mg total) by mouth Once a day for 30 days    doxazosin (CARDURA) 4 mg Oral Tablet    Take 1 Tablet (4 mg total) by mouth Every evening    doxycycline hyclate (VIBRAMYCIN) 100 mg Oral Capsule    Take 1 Capsule (100 mg total) by mouth Twice daily for 5 days    ferric citrate (AURYXIA ORAL)    Take  1 g by mouth Three times daily before meals "Take 2 tablets three times daily before meals and 1 tablet before snacks"    furosemide (LASIX) 80 mg Oral Tablet    Take 2 Tablets (160 mg total) by mouth Once a day Take after dialysis on dialysis days; otherwise take in morning daily.    guaiFENesin (MUCINEX) 600 mg Oral Tablet Extended Release 12hr    Take 1 Tablet (600 mg total) by mouth Every 12 hours    isosorbide mononitrate (IMDUR) 120 mg Oral Tablet Sustained Release 24 hr    Take 1 Tablet (120 mg total) by mouth Every morning for 30 days    labetaloL (NORMODYNE) 300 mg Oral Tablet    Take 1 Tablet (300 mg total) by mouth Every 12 hours for 30 days    loratadine (CLARITIN) 10 mg Oral Tablet    Take 1 Tablet (10 mg total) by mouth Once a day    montelukast (SINGULAIR) 10 mg Oral Tablet    Take 1 Tablet (10 mg total) by mouth Every evening    nitroGLYCERIN (NITROSTAT) 0.4 mg Sublingual Tablet, Sublingual    Place 1 Tablet (0.4 mg total) under the tongue Every 5 minutes for 3 doses for 3 doses over 15 minutes    ondansetron (ZOFRAN) 4 mg Oral Tablet    Take 1 Tablet (4 mg total) by mouth Every 12 hours as needed for Nausea/Vomiting    pantoprazole (PROTONIX) 40 mg Oral Tablet, Delayed Release (E.C.)    Take 1 Tablet (40 mg total) by mouth Once a day          acetaminophen (TYLENOL) tablet, 650 mg, Oral, Q4H PRN  alcohol 62 % (NOZIN NASAL SANITIZER) nasal solution, 1 Each, Each Nostril, 2x/day  [Held by provider] apixaban (ELIQUIS) tablet, 2.5 mg, Oral, 2x/day  budesonide-formoterol (SYMBICORT) 160 mcg-4.5 mcg per inhalation oral inhaler - "Respiratory to administer", 2 Puff, Inhalation, 2x/day  calcitriol (ROCALTROL) capsule, 1 mcg, Oral, M, W, and F  cinacalcet (SENSIPAR) tablet, 30 mg, Oral, QPM  [START ON 10/04/2021] cloNIDine (CATAPRES-TTS) transdermal patch 0.3 mg, 0.3 mg, Transdermal, Q7 Days  clopidogrel (PLAVIX) 75 mg tablet, 75 mg, Oral, Daily  cyanocobalamin (VITAMIN B12) tablet, 1,000 mcg, Oral, Daily  with Lunch  cyclobenzaprine (FLEXERIL) tablet, 5 mg, Oral, 2x/day PRN  dilTIAZem (CARDIZEM CD) 24 hr extended release capsule, 180 mg, Oral, Daily  diphenhydrAMINE (BENADRYL) capsule, 50 mg, Oral, Q6H PRN  doxazosin (CARDURA) tablet, 4 mg, Oral, QPM  epoetin alfa-epbx (RETACRIT) 10,000 units/mL  injection, 10,000 Units, Intravenous, Give in Dialysis  folic acid (FOLVITE) tablet, 1 mg, Oral, Daily  furosemide (LASIX) tablet, 160 mg, Oral, DIALYSIS DAILY  guaiFENesin (MUCINEX) extended release tablet - for cough (expectorant), 600 mg, Oral, 2x/day  iron sucrose (VENOFER) 100 mg/5 mL injection, 100 mg, Intravenous, Give in Dialysis  isosorbide mononitrate (IMDUR) 24 hr extended release tablet, 120 mg, Oral, QAM  labetalol (NORMODYNE) tablet, 300 mg, Oral, 2x/day  loratadine (CLARITIN) tablet, 10 mg, Oral, Daily  montelukast (SINGULAIR) 10 mg tablet, 10 mg, Oral, QPM  NS bolus infusion 40 mL, 40 mL, Intravenous, Once PRN  ondansetron (ZOFRAN) 2 mg/mL injection, 4 mg, Intravenous, Q8H PRN  oxyCODONE-acetaminophen (PERCOCET) 5-325mg  per tablet, 1 Tablet, Oral, Q6H PRN  pantoprazole (PROTONIX) delayed release tablet, 40 mg, Oral, Daily      Allergies   Allergen Reactions    Lisinopril Swelling     Tongue and throat swelling    Oxycodone Itching       ROS:   Constitutional: negative for fevers, chills, sweats, and fatigue  Eyes: negative for visual disturbance, irritation, redness, and icterus  Ears, nose, mouth, throat, and face: negative for hearing loss, tinnitus, ear drainage, nasal congestion, epistaxis, and sore throat  Respiratory: negative for cough, sputum, hemoptysis, wheezing, or dyspnea on exertion  Cardiovascular: negative for chest pain, palpitations, , orthopnea, paroxysmal nocturnal dyspnea, and lower extremity edema  Gastrointestinal: negative for  nausea, vomiting, melena, diarrhea, constipation, and abdominal pain  Genitourinary:negative for frequency, dysuria, nocturia, urinary incontinence, hesitancy,  and hematuria  Integument/breast: negative for rash, skin lesion(s), and pruritus  Hematologic/lymphatic: negative for easy bruising, bleeding, and petechiae  Musculoskeletal:negative for myalgias, arthralgias, neck pain, back pain, and muscle weakness  Neurological: negative for headaches, dizziness, seizures, speech problems, tremor, and weakness  Behavioral/Psych: negative for anxiety, behavior problems, mood swings, and sleep disturbance  Endocrine: negative for temperature intolerance  Allergic/Immunologic: negative for urticaria and angioedema      EXAM:  Filed Vitals:    10/02/21 1430 10/02/21 1500 10/02/21 1515 10/02/21 1530   BP: (!) 160/87  (!) 142/95 (!) 146/96   Pulse: 69  70 69   Resp:       Temp:  36.6 C (97.8 F)     SpO2:           Constitutional: Alert and Oriented time person and place . Not in acute distress  HEENT : Vision and hearing grossly normal   Eyes: Conjunctiva clear., Pupils equal and round, reactive to light and accomodation. , Sclera non-icteric.   ENT: Nose without erythema. , Mouth mucous membranes moist.   Neck: JVD negative no thyromegaly or lymphadenopathy and supple, symmetrical, trachea midline  Respiratory: Clear to auscultation bilaterally. No wheezes, No rales, Good air exchange bilaterally  Cardiovascular: regular rate and rhythm no murmurs no rub  Gastrointestinal: Soft, non-tender, Bowel sounds normal, No hepatosplenomegaly  Genitourinary: no suprapubic tenderness  Lower Extremities : No edema . Pulses + 4 bilateral   Neurologic: Grossly normal. Speech clear.   Psychiatric: Normal mood and affect,   Labs:         CBC Results Differential Results   Recent Labs     09/30/21  1831 10/01/21  0554 10/02/21  0610   WBC 9.2 7.4 8.0   HGB 8.3* 7.8* 7.0*   HCT 24.5* 23.5* 21.6*    Recent Labs     09/30/21  1831   PMNS 77*   LYMPHOCYTES 10*   MONOCYTES 8   EOSINOPHIL  4   BASOPHILS 2  0.20   PMNABS 7.10   LYMPHSABS 0.90*   MONOSABS 0.70   EOSABS 0.30      BMP Results Other  Chemistries Results   Recent Labs     09/30/21  2117 10/01/21  0627 10/02/21  0610   SODIUM 135* 136 135*   POTASSIUM 3.5 3.9 4.0   CHLORIDE 97* 97* 100   CO2 28 28 26    BUN 20 25 35*   CREATININE 5.42* 6.44* 8.51*   GFR 9* 8* 5*   ANIONGAP 10 11 9*    Recent Labs     09/30/21  2117 10/01/21  0627 10/02/21  0610   CALCIUM 9.9 10.0 9.2   ALBUMIN 4.1 4.1 3.7   MAGNESIUM 2.0 2.2 2.1      Liver/Pancreas Enzyme Results Blood Gas     Recent Labs     09/30/21  2117 10/01/21  0627 10/02/21  0610   TOTALPROTEIN 7.7 7.2 6.9   ALBUMIN 4.1 4.1 3.7   AST 12* 13 16   ALT <7* <7* <7*   ALKPHOS 71 71 61    No results found for this encounter   Cardiac Results    Coags Results     TROPONIN I  Lab Results   Component Value Date    UHCEASTTROPI 0.06 (Odin) 12/13/2020    TROPONINI 72 (H) 10/01/2021    TROPONINI 72 (H) 09/30/2021    TROPONINI 64 (H) 09/30/2021         No results for input(s): INR in the last 72 hours.    Invalid input(s): PTT, PT       Imaging Studies:    XR AP MOBILE CHEST   Final Result   Findings likely represent pulmonary edema with overall aeration unchanged compared with 09/28/2021         Radiologist location ID: [id number]              Assessment/Plan:  Active Hospital Problems    Diagnosis    Primary Problem: Hypertension    ESRD (end stage renal disease) (CMS HCC)    Anemia in chronic kidney disease    LVH (left ventricular hypertrophy)    Elevated troponin I level    Congestive heart failure (CMS HCC)    Chronic chest pain    Uncontrolled hypertension    Secondary hyperparathyroidism of renal origin (CMS HCC)    COPD (chronic obstructive pulmonary disease) (CMS HCC)      End-stage renal disease hemodialysis Monday Wednesday Friday dialyzed today tolerated dialysis very well continue dialysis as scheduled  Anemia of chronic kidney disease will be started on iron Venofer Epogen vitamin I77 and folic acid supplements and given blood transfusion for hemoglobin of 7 today  Secondary hyperparathyroidism started on  calcium Rocaltrol  Essential hypertension blood pressure better controlled  Chest pain seen by Cardiology      Richardean Sale, MD

## 2021-10-02 NOTE — Nurses Notes (Signed)
Pt completed Dialysis treatment. Tolerated well. 2.5Liters of fluid pulled. Pt received 2 untis of PRBC's during dialysis.

## 2021-10-02 NOTE — Progress Notes (Signed)
[facility]  IP PROGRESS NOTE      Date of Admission:  09/30/2021  Hospital Day:  LOS: 2 days       Subjective:   Flowsheet overnight notes pressures with systolic pressure anywhere from 88-130.  However patient remained on narcotic pain throughout the night despite lower blood pressures.    Vitals:  Temp (24hrs) Max:36.9 C (98.4 F)      Temperature: 36.9 C (98.4 F)  BP (Non-Invasive): (!) 120/58  MAP (Non-Invasive): 72 mmHG  Heart Rate: 77  Respiratory Rate: 16  SpO2: 90 %    I/O:  I/O last 24 hours:    Intake/Output Summary (Last 24 hours) at 10/02/2021 0806  Last data filed at 10/02/2021 0600  Gross per 24 hour   Intake 1620.17 ml   Output 100 ml   Net 1520.17 ml       PE:  General: NAD  HEENT:  no thrush, no scleral icterus, no conjunctival pallor  Cardiovascular:  1rregular rate, normal S1 and S2, no murmurs or extra heart sounds, 2+ radial pulses B/L,  Respiratory:  No increased work of breathing, good air movement throughout, no wheezes or crackles  Abdomen: Soft, non-tender, non-distended  Extremities: No lower extremity edema  Skin: Warm, no rashes  Neurologic:  Nonfocal  Lines/tubes/drains:     Current Meds:  acetaminophen (TYLENOL) tablet, 650 mg, Oral, Q4H PRN  alcohol 62 % (NOZIN NASAL SANITIZER) nasal solution, 1 Each, Each Nostril, 2x/day  [Held by provider] apixaban (ELIQUIS) tablet, 2.5 mg, Oral, 2x/day  budesonide-formoterol (SYMBICORT) 160 mcg-4.5 mcg per inhalation oral inhaler - "Respiratory to administer", 2 Puff, Inhalation, 2x/day  calcitriol (ROCALTROL) capsule, 1 mcg, Oral, M, W, and F  cinacalcet (SENSIPAR) tablet, 30 mg, Oral, QPM  [START ON 10/04/2021] cloNIDine (CATAPRES-TTS) transdermal patch 0.3 mg, 0.3 mg, Transdermal, Q7 Days  clopidogrel (PLAVIX) 75 mg tablet, 75 mg, Oral, Daily  cyclobenzaprine (FLEXERIL) tablet, 5 mg, Oral, 2x/day PRN  dilTIAZem (CARDIZEM CD) 24 hr extended release capsule, 180 mg, Oral, Daily  diphenhydrAMINE (BENADRYL) capsule, 50 mg, Oral,  Q6H PRN  doxazosin (CARDURA) tablet, 4 mg, Oral, QPM  furosemide (LASIX) tablet, 160 mg, Oral, DIALYSIS DAILY  guaiFENesin (MUCINEX) extended release tablet - for cough (expectorant), 600 mg, Oral, 2x/day  isosorbide mononitrate (IMDUR) 24 hr extended release tablet, 120 mg, Oral, QAM  labetalol (NORMODYNE) tablet, 300 mg, Oral, 2x/day  loratadine (CLARITIN) tablet, 10 mg, Oral, Daily  montelukast (SINGULAIR) 10 mg tablet, 10 mg, Oral, QPM  niCARdipine (CARDENE) 25 mg in NS 250 mL (tot vol) infusion, 5 mg/hr, Intravenous, Continuous  ondansetron (ZOFRAN) 2 mg/mL injection, 4 mg, Intravenous, Q8H PRN  oxyCODONE-acetaminophen (PERCOCET) 5-380m per tablet, 1 Tablet, Oral, Q6H PRN  pantoprazole (PROTONIX) delayed release tablet, 40 mg, Oral, Daily        Labs:  Reviewed:   Results for orders placed or performed during the hospital encounter of 09/30/21 (from the past 24 hour(s))   TROPONIN-I   Result Value Ref Range    TROPONIN I 72 (H) <15 ng/L   COMPREHENSIVE METABOLIC PANEL, NON-FASTING   Result Value Ref Range    SODIUM 135 (L) 136 - 145 mmol/L    POTASSIUM 4.0 3.5 - 5.1 mmol/L    CHLORIDE 100 98 - 107 mmol/L    CO2 TOTAL 26 21 - 31 mmol/L    ANION GAP 9 (L) 10 - 20 mmol/L    BUN 35 (H) 7 - 25 mg/dL    CREATININE 8.51 (  H) 0.60 - 1.30 mg/dL    BUN/CREA RATIO 4 (L) 6 - 22    ESTIMATED GFR 5 (L) >59 mL/min/1.79m2    ALBUMIN 3.7 3.5 - 5.7 g/dL    CALCIUM 9.2 8.6 - 10.3 mg/dL    GLUCOSE 85 74 - 109 mg/dL    ALKALINE PHOSPHATASE 61 34 - 104 U/L    ALT (SGPT) <7 (L) 7 - 52 U/L    AST (SGOT) 16 13 - 39 U/L    BILIRUBIN TOTAL 0.6 0.3 - 1.2 mg/dL    PROTEIN TOTAL 6.9 6.4 - 8.9 g/dL    ALBUMIN/GLOBULIN RATIO 1.2 0.8 - 1.4    OSMOLALITY, CALCULATED 277 270 - 290 mOsm/kg    CALCIUM, CORRECTED 9.5 8.9 - 10.8 mg/dL    GLOBULIN 3.2 2.9 - 5.4    Narrative    Estimated Glomerular Filtration Rate (eGFR) is calculated using the CKD-EPI (2021) equation, intended for patients 145years of age and older. If gender is not documented or  "unknown", there will be no eGFR calculation.   MAGNESIUM   Result Value Ref Range    MAGNESIUM 2.1 1.9 - 2.7 mg/dL   CBC   Result Value Ref Range    WBCS UNCORRECTED 8.0 x10^3/uL    WBC 8.0 4.0 - 10.5 x10^3/uL    RBC 2.45 (L) 4.20 - 5.40 x10^6/uL    HGB 7.0 (L) 12.5 - 16.0 g/dL    HCT 21.6 (L) 37.0 - 47.0 %    MCV 88.0 78.0 - 99.0 fL    MCH 28.6 27.0 - 32.0 pg    MCHC 32.6 32.0 - 36.0 g/dL    RDW 19.5 (H) 11.6 - 14.8 %    PLATELETS 209 140 - 440 x10^3/uL    MPV 6.8 (L) 7.4 - 10.4 fL   IRON TRANSFERRIN AND TIBC   Result Value Ref Range    TOTAL IRON BINDING CAPACITY 168 (L) 250 - 450 ug/dL    IRON (TRANSFERRIN) SATURATION 14 (L) 15 - 50 %    IRON 23 (L) 50 - 212 ug/dL    TRANSFERRIN 120 (L) 203 - 362 mg/dL    UIBC 145 130 - 375 ug/dL   FERRITIN   Result Value Ref Range    FERRITIN 626 (H) 11 - 336 ng/mL   VITAMIN B12   Result Value Ref Range    VITAMIN B 12 286 180 - 914 pg/mL   FOLATE   Result Value Ref Range    FOLATE 8.1 5.9 - 24.4 ng/mL   PARATHYROID HORMONE (PTH)   Result Value Ref Range    PTH 148.1 (H) 12.0 - 88.0 pg/mL   TYPE AND CROSS RED CELLS - UNITS , 1 Units   Result Value Ref Range    UNITS ORDERED 1     SPECIMEN EXPIRATION DATE 10/05/2021,2359     ABO/RH(D) O POSITIVE     ANTIBODY SCREEN NEGATIVE     COMMUNICATION LOG  CALL KRISTEN SPENCER 0723    BPAM PACKED CELL ORDER   Result Value Ref Range    Coding System [id number]     UNIT NUMBER [id number]    BLOOD COMPONENT TYPE LR RBC, Adsol1, 04710     UNIT DIVISION 00     UNIT DISPENSE STATUS ALLOCATED     TRANSFUSION STATUS OK TO TRANSFUSE     IS CROSSMATCH Electronically Compatible     Product Code [id number]          Radiology:  Reviewed:          Problem List:  Active Hospital Problems   (*Primary Problem)    Diagnosis    *Hypertension    ESRD (end stage renal disease) (CMS HCC)     Chronic    Anemia in chronic kidney disease    LVH (left ventricular hypertrophy)     Chronic    Elevated troponin I level     Chronic    Congestive heart failure (CMS  HCC)    Chronic chest pain     Chronic    Uncontrolled hypertension     Chronic    COPD (chronic obstructive pulmonary disease) (CMS HCC)       A/P:  Hypertensive urgency  Non adherence to medical therapy  ESRD  DM  Chest pain     Discontinue nicardipine drip  Resumed outpatient blood pressure medications  HD per nephrology  No dilaudid   Stop morphine  Cardiology consultation pending  Suspect there are plans for cardiac catheterization  Troponins and EKGs essentially unchanged.    Stable for transfer to floor

## 2021-10-02 NOTE — Nurses Notes (Signed)
Report called to [place], [employer].

## 2021-10-03 ENCOUNTER — Encounter (HOSPITAL_COMMUNITY)
Admission: EM | Disposition: A | Discharge status: Other Acute Inpatient Hospital | Payer: Self-pay | Source: Home / Self Care | Attending: Critical Care Medicine

## 2021-10-03 DIAGNOSIS — G8929 Other chronic pain: Secondary | ICD-10-CM

## 2021-10-03 DIAGNOSIS — I517 Cardiomegaly: Secondary | ICD-10-CM

## 2021-10-03 DIAGNOSIS — I509 Heart failure, unspecified: Secondary | ICD-10-CM

## 2021-10-03 DIAGNOSIS — R778 Other specified abnormalities of plasma proteins: Secondary | ICD-10-CM

## 2021-10-03 DIAGNOSIS — Z9889 Other specified postprocedural states: Secondary | ICD-10-CM

## 2021-10-03 DIAGNOSIS — I2510 Atherosclerotic heart disease of native coronary artery without angina pectoris: Secondary | ICD-10-CM

## 2021-10-03 DIAGNOSIS — J449 Chronic obstructive pulmonary disease, unspecified: Secondary | ICD-10-CM

## 2021-10-03 LAB — COMPREHENSIVE METABOLIC PANEL, NON-FASTING
ALBUMIN/GLOBULIN RATIO: 1.2 (ref 0.8–1.4)
ALBUMIN: 4 g/dL (ref 3.5–5.7)
ALKALINE PHOSPHATASE: 70 U/L (ref 34–104)
ALT (SGPT): <7 U/L — ABNORMAL LOW (ref 7–52)
ANION GAP: 11 mmol/L (ref 10–20)
AST (SGOT): 17 U/L (ref 13–39)
BILIRUBIN TOTAL: 0.8 mg/dL (ref 0.3–1.2)
BUN/CREA RATIO: 4 — ABNORMAL LOW (ref 6–22)
BUN: 25 mg/dL (ref 7–25)
CALCIUM, CORRECTED: 9.9 mg/dL (ref 8.9–10.8)
CALCIUM: 9.9 mg/dL (ref 8.6–10.3)
CHLORIDE: 96 mmol/L — ABNORMAL LOW (ref 98–107)
CO2 TOTAL: 29 mmol/L (ref 21–31)
CREATININE: 6.96 mg/dL — ABNORMAL HIGH (ref 0.60–1.30)
ESTIMATED GFR: 7 mL/min/{1.73_m2} — ABNORMAL LOW (ref >59)
GLOBULIN: 3.3 (ref 2.9–5.4)
GLUCOSE: 81 mg/dL (ref 74–109)
OSMOLALITY, CALCULATED: 275 mOsm/kg (ref 270–290)
POTASSIUM: 4.2 mmol/L (ref 3.5–5.1)
PROTEIN TOTAL: 7.3 g/dL (ref 6.4–8.9)
SODIUM: 136 mmol/L (ref 136–145)

## 2021-10-03 LAB — CBC
HCT: 28.4 % — ABNORMAL LOW (ref 37.0–47.0)
HGB: 9.5 g/dL — ABNORMAL LOW (ref 12.5–16.0)
MCH: 28.4 pg (ref 27.0–32.0)
MCHC: 33.3 g/dL (ref 32.0–36.0)
MCV: 85.5 fL (ref 78.0–99.0)
MPV: 6.6 fL — ABNORMAL LOW (ref 7.4–10.4)
PLATELETS: 238 10*3/uL (ref 140–440)
RBC: 3.33 10*6/uL — ABNORMAL LOW (ref 4.20–5.40)
RDW: 19.4 % — ABNORMAL HIGH (ref 11.6–14.8)
WBC: 7.8 10*3/uL (ref 4.0–10.5)
WBCS UNCORRECTED: 7.8 10*3/uL

## 2021-10-03 LAB — URINE CULTURE,ROUTINE: URINE CULTURE: 20000 — AB

## 2021-10-03 LAB — BPAM PACKED CELL ORDER
UNIT DIVISION: 0
UNIT DIVISION: 0

## 2021-10-03 LAB — TYPE AND CROSS RED CELLS - UNITS
ABO/RH(D): O POS
ANTIBODY SCREEN: NEGATIVE
UNITS ORDERED: 2

## 2021-10-03 LAB — PT/INR
INR: 1.15 (ref <=5.00)
PROTHROMBIN TIME: 13.3 seconds — ABNORMAL HIGH (ref 9.8–12.7)

## 2021-10-03 LAB — MAGNESIUM: MAGNESIUM: 2.1 mg/dL (ref 1.9–2.7)

## 2021-10-03 LAB — PTT (PARTIAL THROMBOPLASTIN TIME): APTT: 47.5 seconds — ABNORMAL HIGH (ref 26.0–36.0)

## 2021-10-03 SURGERY — CORONARY ANGIOGRAPHY W/LEFT HEART CATH W/WO LVG
Anesthesia: IV Sedation (Nurse Monitored)

## 2021-10-03 MED ORDER — LABETALOL 300 MG TABLET
300.0000 mg | ORAL_TABLET | Freq: Two times a day (BID) | ORAL | Status: DC
Start: 2021-10-03 — End: 2022-01-01

## 2021-10-03 MED ORDER — OXYCODONE-ACETAMINOPHEN 5 MG-325 MG TABLET
1.0000 | ORAL_TABLET | Freq: Four times a day (QID) | ORAL | 0 refills | Status: DC | PRN
Start: 2021-10-03 — End: 2021-11-13

## 2021-10-03 MED ORDER — FENTANYL (PF) 50 MCG/ML INJECTION SOLUTION
INTRAMUSCULAR | Status: AC
Start: 2021-10-03 — End: 2021-10-03
  Filled 2021-10-03: qty 2

## 2021-10-03 MED ORDER — LABETALOL 5 MG/ML INTRAVENOUS SOLUTION
10.0000 mg | INTRAVENOUS | Status: DC | PRN
Start: 2021-10-03 — End: 2021-10-21

## 2021-10-03 MED ORDER — LABETALOL 5 MG/ML INTRAVENOUS SOLUTION
INTRAVENOUS | Status: AC
Start: 2021-10-03 — End: 2021-10-03
  Filled 2021-10-03: qty 20

## 2021-10-03 MED ORDER — DIPHENHYDRAMINE 25 MG CAPSULE
25.0000 mg | ORAL_CAPSULE | ORAL | Status: AC
Start: 2021-10-03 — End: 2021-10-03
  Administered 2021-10-03: 25 mg via ORAL
  Filled 2021-10-03 (×2): qty 1

## 2021-10-03 MED ORDER — FENTANYL (PF) 50 MCG/ML INJECTION WRAPPER
INJECTION | Freq: Once | INTRAMUSCULAR | Status: DC | PRN
Start: 2021-10-03 — End: 2021-10-03
  Administered 2021-10-03 (×3): 50 ug via INTRAVENOUS

## 2021-10-03 MED ORDER — DIPHENHYDRAMINE 50 MG CAPSULE
50.0000 mg | ORAL_CAPSULE | Freq: Four times a day (QID) | ORAL | Status: DC | PRN
Start: 2021-10-03 — End: 2021-11-13

## 2021-10-03 MED ORDER — SODIUM CHLORIDE 0.9 % INTRAVENOUS SOLUTION
INTRAVENOUS | Status: AC | PRN
Start: 2021-10-03 — End: 2021-10-03
  Administered 2021-10-03: 15 mL/h via INTRAVENOUS

## 2021-10-03 MED ORDER — NITROGLYCERIN 50 MG/250 ML (200 MCG/ML) IN 5 % DEXTROSE INTRAVENOUS
INTRAVENOUS | Status: AC
Start: 2021-10-03 — End: 2021-10-03
  Filled 2021-10-03: qty 250

## 2021-10-03 MED ORDER — ONDANSETRON HCL (PF) 4 MG/2 ML INJECTION SOLUTION
4.0000 mg | Freq: Three times a day (TID) | INTRAMUSCULAR | Status: DC | PRN
Start: 2021-10-03 — End: 2021-10-21

## 2021-10-03 MED ORDER — DIAZEPAM 5 MG TABLET
5.0000 mg | ORAL_TABLET | ORAL | Status: AC
Start: 2021-10-03 — End: 2021-10-03
  Administered 2021-10-03: 5 mg via ORAL
  Filled 2021-10-03: qty 1

## 2021-10-03 MED ORDER — LABETALOL 5 MG/ML INTRAVENOUS SOLUTION
10.0000 mg | INTRAVENOUS | Status: DC | PRN
Start: 2021-10-03 — End: 2021-10-03

## 2021-10-03 MED ORDER — EPOETIN ALFA-EPBX 10,000 UNIT/ML INJECTION SOLUTION
10000.0000 [IU] | INTRAMUSCULAR | Status: DC
Start: 2021-10-03 — End: 2021-10-21

## 2021-10-03 MED ORDER — NITROGLYCERIN 50 MG/250 ML (200 MCG/ML) IN 5 % DEXTROSE INTRAVENOUS
INTRAVENOUS | Status: AC | PRN
Start: 2021-10-03 — End: 2021-10-03
  Administered 2021-10-03: 25 ug/min via INTRAVENOUS
  Administered 2021-10-03: 30 ug/min via INTRAVENOUS
  Administered 2021-10-03: 15 ug/min via INTRAVENOUS
  Administered 2021-10-03: 45 ug/min via INTRAVENOUS
  Administered 2021-10-03: 80 ug/min via INTRAVENOUS
  Administered 2021-10-03: 75 ug/min via INTRAVENOUS
  Administered 2021-10-03: 60 ug/min via INTRAVENOUS
  Administered 2021-10-03: 55 ug/min via INTRAVENOUS
  Administered 2021-10-03: 40 ug/min via INTRAVENOUS
  Administered 2021-10-03: 35 ug/min via INTRAVENOUS
  Administered 2021-10-03: 20 ug/min via INTRAVENOUS
  Administered 2021-10-03: 10 ug/min via INTRAVENOUS

## 2021-10-03 MED ORDER — IRON SUCROSE 100 MG IRON/5 ML INTRAVENOUS SOLUTION
100.0000 mg | INTRAVENOUS | Status: DC
Start: 2021-10-03 — End: 2021-10-21

## 2021-10-03 MED ORDER — HYDRALAZINE 20 MG/ML INJECTION SOLUTION
INTRAMUSCULAR | Status: AC
Start: 2021-10-03 — End: 2021-10-03
  Filled 2021-10-03: qty 1

## 2021-10-03 MED ORDER — IPRATROPIUM 0.5 MG-ALBUTEROL 3 MG (2.5 MG BASE)/3 ML NEBULIZATION SOLN
3.0000 mL | INHALATION_SOLUTION | Freq: Four times a day (QID) | RESPIRATORY_TRACT | Status: DC | PRN
Start: 2021-10-03 — End: 2021-10-03
  Administered 2021-10-03: 3 mL via RESPIRATORY_TRACT
  Filled 2021-10-03: qty 3

## 2021-10-03 MED ORDER — LABETALOL 5 MG/ML INTRAVENOUS SOLUTION
Freq: Once | INTRAVENOUS | Status: DC | PRN
Start: 2021-10-03 — End: 2021-10-03
  Administered 2021-10-03 (×2): 10 mg via INTRAVENOUS

## 2021-10-03 MED ORDER — HYDROMORPHONE 2 MG/ML INJECTION WRAPPER
0.4000 mg | INJECTION | INTRAMUSCULAR | Status: DC | PRN
Start: 2021-10-03 — End: 2021-10-03
  Administered 2021-10-03: 0.4 mg via INTRAVENOUS
  Filled 2021-10-03: qty 1

## 2021-10-03 MED ORDER — NITROGLYCERIN 50 MG/250 ML (200 MCG/ML) IN 5 % DEXTROSE INTRAVENOUS
5.0000 ug/min | INTRAVENOUS | Status: DC
Start: 2021-10-03 — End: 2021-10-03
  Administered 2021-10-03 (×2): 95 ug/min via INTRAVENOUS
  Administered 2021-10-03: 90 ug/min via INTRAVENOUS
  Filled 2021-10-03: qty 250

## 2021-10-03 MED ORDER — MIDAZOLAM 5 MG/ML INJECTION WRAPPER
Freq: Once | INTRAMUSCULAR | Status: DC | PRN
Start: 2021-10-03 — End: 2021-10-03
  Administered 2021-10-03 (×2): 2 mg via INTRAVENOUS

## 2021-10-03 MED ORDER — SODIUM CHLORIDE 0.9 % INTRAVENOUS SOLUTION
INTRAVENOUS | Status: DC | PRN
Start: 2021-10-03 — End: 2021-10-03
  Administered 2021-10-03: 5 mg/h via INTRAVENOUS
  Administered 2021-10-03: 0 via INTRAVENOUS
  Administered 2021-10-03: 10 mg/h via INTRAVENOUS

## 2021-10-03 MED ORDER — NITROGLYCERIN 50 MG/250 ML (200 MCG/ML) IN 5 % DEXTROSE INTRAVENOUS
5.0000 ug/min | INTRAVENOUS | Status: DC
Start: 2021-10-03 — End: 2021-10-21

## 2021-10-03 MED ORDER — CYANOCOBALAMIN (VIT B-12) 1,000 MCG TABLET
1000.0000 ug | ORAL_TABLET | Freq: Every day | ORAL | Status: DC
Start: 2021-10-03 — End: 2021-11-13

## 2021-10-03 MED ORDER — LIDOCAINE HCL 10 MG/ML (1 %) INJECTION SOLUTION
INTRAMUSCULAR | Status: AC
Start: 2021-10-03 — End: 2021-10-03
  Filled 2021-10-03: qty 50

## 2021-10-03 MED ORDER — NICARDIPINE 25 MG/10 ML INTRAVENOUS SOLUTION
INTRAVENOUS | Status: AC
Start: 2021-10-03 — End: 2021-10-03
  Filled 2021-10-03: qty 10

## 2021-10-03 MED ORDER — MIDAZOLAM 5 MG/ML INJECTION WRAPPER
INTRAMUSCULAR | Status: AC
Start: 2021-10-03 — End: 2021-10-03
  Filled 2021-10-03: qty 1

## 2021-10-03 MED ORDER — LABETALOL 5 MG/ML INTRAVENOUS SOLUTION
10.0000 mg | INTRAVENOUS | Status: DC | PRN
Start: 2021-10-03 — End: 2021-10-03
  Administered 2021-10-03: 10 mg via INTRAVENOUS

## 2021-10-03 MED ORDER — ASPIRIN 81 MG CHEWABLE TABLET
324.0000 mg | CHEWABLE_TABLET | ORAL | Status: DC
Start: 2021-10-03 — End: 2021-10-03

## 2021-10-03 MED ORDER — IPRATROPIUM 0.5 MG-ALBUTEROL 3 MG (2.5 MG BASE)/3 ML NEBULIZATION SOLN
3.0000 mL | INHALATION_SOLUTION | Freq: Four times a day (QID) | RESPIRATORY_TRACT | Status: DC | PRN
Start: 2021-10-03 — End: 2022-01-30

## 2021-10-03 MED ORDER — HYDRALAZINE 20 MG/ML INJECTION SOLUTION
Freq: Once | INTRAMUSCULAR | Status: DC | PRN
Start: 2021-10-03 — End: 2021-10-03
  Administered 2021-10-03: 20 mg via INTRAVENOUS

## 2021-10-03 MED ORDER — ATORVASTATIN 40 MG TABLET
80.0000 mg | ORAL_TABLET | Freq: Every evening | ORAL | Status: DC
Start: 2021-10-03 — End: 2021-10-03

## 2021-10-03 MED ORDER — DILTIAZEM 5 MG/ML INTRAVENOUS SOLUTION
INTRAVENOUS | Status: DC | PRN
Start: 2021-10-03 — End: 2021-10-03
  Administered 2021-10-03: 5 mg/h via INTRAVENOUS

## 2021-10-03 MED ORDER — FOLIC ACID 1 MG TABLET
1.0000 mg | ORAL_TABLET | Freq: Every day | ORAL | Status: DC
Start: 2021-10-03 — End: 2021-10-21

## 2021-10-03 MED ORDER — HEPARIN (PORCINE) (PF) 2,000 UNIT/1,000 ML IN 0.9 % SODIUM CHLORIDE IV
INTRAVENOUS | Status: AC
Start: 2021-10-03 — End: 2021-10-03
  Filled 2021-10-03: qty 2000

## 2021-10-03 MED ORDER — HYDROMORPHONE 2 MG/ML INJECTION WRAPPER
0.4000 mg | INJECTION | INTRAMUSCULAR | 0 refills | Status: DC | PRN
Start: 2021-10-03 — End: 2021-10-21

## 2021-10-03 SURGICAL SUPPLY — 15 items
CATH ANGIO 5FR JR4 CURVE 100CM PERFORMA RADOPQ BRD PERI PEBAX PLYCRB NYL STRL ACPT .038IN GW (CATHETERS) ×1 IMPLANT
CATH ANGIO 5FR JR4 CURVE 100CM_PRFM RADOPQ BRD PERI PEBAX (CATHETERS) ×2
CATH ANGIO 5FR PRC CURVE 100CM PERFORMA RADOPQ COR PERI (CATHETERS) ×2
CATH ANGIO 5FR PRC CURVE 100CM PERFORMA RADOPQ COR PERI PEBAX PLYCRB NYL STRL ACPT .038IN GW (CATHETERS) ×1 IMPLANT
CATH ANGIO 6FR 145D PGTL CURVE 110CM PERFORMA 5 SH RADOPQ (CATHETERS) ×2
CATH ANGIO 6FR 145D PGTL CURVE 110CM PERFORMA 5 SH RADOPQ BRD PERI CARDIAC PEBAX PLYCRB NYL STRL (CATHETERS) ×1 IMPLANT
CATH ANGIO 6FR JL4 CURVE 100CM PERFORMA RADOPQ BRD PERI (CATHETERS) ×2
CATH ANGIO 6FR JL4 CURVE 100CM PERFORMA RADOPQ BRD PERI PEBAX PLYCRB NYL STRL ACPT .038IN GW (CATHETERS) ×1 IMPLANT
GW .035IN 260CM ANGIO PTFE FIX COR STD BODY STN FNSH 2 END 3MM J CURVE STRL (WIRE) ×1 IMPLANT
GW INQWR .035IN 150CM FIX COR FINGER STRG PTFE VAS 3MM RAD STD J STRL LF (WIRE) ×1 IMPLANT
GW INQWR .035IN 150CM FIX COR_NGR STRG STD SHFT PTFE VAS 3 (WIRE) ×2
GW INQWR .035IN 260CM FIX COR_FINGER STRG STD SHAFT PTFE VAS (WIRE) ×2
SET ADMN MERIT MEDICAL SYS INJ MED CNRST S DISP (VASCULAR) ×2 IMPLANT
SHEATH INTROD 10CM 6FR PINN COR SS HDRPH PTFE SNAPON DIL LOCK KINK RST SMOOTH TRNS 2.5CM ACPT .038IN (INTRODUCER) ×1 IMPLANT
SHEATH SUPER 6FR 11CM .035_RSS602 10EA/BX (INTRODUCER) ×2

## 2021-10-03 NOTE — Progress Notes (Signed)
[facility]  IP PROGRESS NOTE      Date of Admission:  09/30/2021  Hospital Day:  LOS: 3 days       Subjective:   Currently not having chest pain.  Is having pretty significant back pain however.  She did get catheterization this morning.  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  She also had lesions in the circumflex as well as LAD. Dr Viann Shove spoke with Dr. Cloyde Reams at [facility].  Plans have been made for transfer to [facility] for higher level of care an intervention on coronary arteries.  At this time there are no beds however at [facility]  Patient has been placed on nitroglycerin drip without significant improvement in her blood pressure.  Currently on 95 mcg.    Note that when I have taken care for previously she has had much better control of her blood pressure with nicardipine drip.  However in talking with Dr. Viann Shove is felt that the retrocardiac pain drip likely contributed in some degree to her chest pain.  Apparently chest pain improved after being off nicardipine.  With that said historically she has been on nitroglycerin drip for her blood pressure which is not improved blood pressure.  It is not improving her blood pressure at this time either.  She has received all her oral antihypertensives today.    Vitals:  Temp (24hrs) Max:36.6 C (97.8 F)      Temperature: 36.3 C (97.3 F)  BP (Non-Invasive): (!) 170/114  MAP (Non-Invasive): 132 mmHG  Heart Rate: 95  Respiratory Rate: 20  SpO2: 97 %    I/O:  I/O last 24 hours:    Intake/Output Summary (Last 24 hours) at 10/03/2021 1456  Last data filed at 10/03/2021 1312  Gross per 24 hour   Intake 85.51 ml   Output --   Net 85.51 ml       PE:  General:  Mild distress  HEENT:  no thrush, no scleral icterus, no conjunctival pallor  Cardiovascular:  Regular rate, normal S1 and S2, murmur, 2+ radial pulses B/L,  Respiratory:  No increased work of breathing, good air movement throughout, no wheezes or crackles  Abdomen: Soft,  non-tender, non-distended  Extremities: No lower extremity edema  Skin: Warm, no rashes  Neurologic: Symmetric gaze, no facial asymmetry, interactive  Lines/tubes/drains:     Current Meds:  acetaminophen (TYLENOL) tablet, 650 mg, Oral, Q4H PRN  alcohol 62 % (NOZIN NASAL SANITIZER) nasal solution, 1 Each, Each Nostril, 2x/day  [Held by provider] apixaban (ELIQUIS) tablet, 2.5 mg, Oral, 2x/day  budesonide-formoterol (SYMBICORT) 160 mcg-4.5 mcg per inhalation oral inhaler - "Respiratory to administer", 2 Puff, Inhalation, 2x/day  calcitriol (ROCALTROL) capsule, 1 mcg, Oral, M, W, and F  cinacalcet (SENSIPAR) tablet, 30 mg, Oral, QPM  [START ON 10/04/2021] cloNIDine (CATAPRES-TTS) transdermal patch 0.3 mg, 0.3 mg, Transdermal, Q7 Days  clopidogrel (PLAVIX) 75 mg tablet, 75 mg, Oral, Daily  cyanocobalamin (VITAMIN B12) tablet, 1,000 mcg, Oral, Daily with Lunch  cyclobenzaprine (FLEXERIL) tablet, 5 mg, Oral, 2x/day PRN  dilTIAZem (CARDIZEM CD) 24 hr extended release capsule, 180 mg, Oral, Daily  diphenhydrAMINE (BENADRYL) capsule, 50 mg, Oral, Q6H PRN  doxazosin (CARDURA) tablet, 4 mg, Oral, QPM  epoetin alfa-epbx (RETACRIT) 10,000 units/mL injection, 10,000 Units, Intravenous, Give in Dialysis  folic acid (FOLVITE) tablet, 1 mg, Oral, Daily  furosemide (LASIX) tablet, 160 mg, Oral, DIALYSIS DAILY  guaiFENesin (MUCINEX) extended release tablet - for cough (expectorant), 600 mg, Oral, 2x/day  HYDROmorphone (DILAUDID) 2 mg/mL injection, 0.4 mg, Intravenous, Q4H PRN  ipratropium-albuterol 0.5 mg-3 mg(2.5 mg base)/3 mL Solution for Nebulization, 3 mL, Nebulization, 4x/day PRN  iron sucrose (VENOFER) 100 mg/5 mL injection, 100 mg, Intravenous, Give in Dialysis  isosorbide mononitrate (IMDUR) 24 hr extended release tablet, 120 mg, Oral, QAM  labetalol (NORMODYNE) tablet, 300 mg, Oral, 2x/day  labetalol (TRANDATE) 5 mg/mL injection, 10 mg, Intravenous, Q1H PRN  loratadine (CLARITIN) tablet, 10 mg, Oral, Daily  montelukast  (SINGULAIR) 10 mg tablet, 10 mg, Oral, QPM  nitroGLYCERIN 50 mg in 250 ml D5W premix infusion, 5 mcg/min, Intravenous, Cath Lab Continuous  ondansetron (ZOFRAN) 2 mg/mL injection, 4 mg, Intravenous, Q8H PRN  oxyCODONE-acetaminophen (PERCOCET) 5-323m per tablet, 1 Tablet, Oral, Q6H PRN  pantoprazole (PROTONIX) delayed release tablet, 40 mg, Oral, Daily        Labs:  Reviewed:   Results for orders placed or performed during the hospital encounter of 09/30/21 (from the past 24 hour(s))   COMPREHENSIVE METABOLIC PANEL, NON-FASTING   Result Value Ref Range    SODIUM 136 136 - 145 mmol/L    POTASSIUM 4.2 3.5 - 5.1 mmol/L    CHLORIDE 96 (L) 98 - 107 mmol/L    CO2 TOTAL 29 21 - 31 mmol/L    ANION GAP 11 10 - 20 mmol/L    BUN 25 7 - 25 mg/dL    CREATININE 6.96 (H) 0.60 - 1.30 mg/dL    BUN/CREA RATIO 4 (L) 6 - 22    ESTIMATED GFR 7 (L) >59 mL/min/1.762m    ALBUMIN 4.0 3.5 - 5.7 g/dL    CALCIUM 9.9 8.6 - 10.3 mg/dL    GLUCOSE 81 74 - 109 mg/dL    ALKALINE PHOSPHATASE 70 34 - 104 U/L    ALT (SGPT) <7 (L) 7 - 52 U/L    AST (SGOT) 17 13 - 39 U/L    BILIRUBIN TOTAL 0.8 0.3 - 1.2 mg/dL    PROTEIN TOTAL 7.3 6.4 - 8.9 g/dL    ALBUMIN/GLOBULIN RATIO 1.2 0.8 - 1.4    OSMOLALITY, CALCULATED 275 270 - 290 mOsm/kg    CALCIUM, CORRECTED 9.9 8.9 - 10.8 mg/dL    GLOBULIN 3.3 2.9 - 5.4    Narrative    Estimated Glomerular Filtration Rate (eGFR) is calculated using the CKD-EPI (2021) equation, intended for patients 1882ears of age and older. If gender is not documented or "unknown", there will be no eGFR calculation.   MAGNESIUM   Result Value Ref Range    MAGNESIUM 2.1 1.9 - 2.7 mg/dL   CBC   Result Value Ref Range    WBCS UNCORRECTED 7.8 x10^3/uL    WBC 7.8 4.0 - 10.5 x10^3/uL    RBC 3.33 (L) 4.20 - 5.40 x10^6/uL    HGB 9.5 (L) 12.5 - 16.0 g/dL    HCT 28.4 (L) 37.0 - 47.0 %    MCV 85.5 78.0 - 99.0 fL    MCH 28.4 27.0 - 32.0 pg    MCHC 33.3 32.0 - 36.0 g/dL    RDW 19.4 (H) 11.6 - 14.8 %    PLATELETS 238 140 - 440 x10^3/uL    MPV 6.6 (L) 7.4  - 10.4 fL   PT/INR   Result Value Ref Range    PROTHROMBIN TIME 13.3 (H) 9.8 - 12.7 seconds    INR 1.15 <=5.00    Narrative    INR OF 2.0-3.0  RECOMMENDED FOR: PROPHYLAXIS/TREATMENT OF VENEOUS THROMBOSIS, PULMONARY EMBOLISM, PREVENTION OF SYSTEMIC  EMBOLISM FROM ATRIAL FIBRILATION, MYOCARDIAL INFARCTION.    INR OF 2.5-3.5  RECOMMENDED FOR MECHANICAL PROSTHETIC HEART VALVES, RECURRENT SYSTEMIC EMBOLISM, RECURRENT MYOCARDIAL INFARCTION.     PTT (PARTIAL THROMBOPLASTIN TIME)   Result Value Ref Range    APTT 47.5 (H) 26.0 - 36.0 seconds          Radiology:  Reviewed:          Problem List:  Active Hospital Problems   (*Primary Problem)    Diagnosis    *Hypertension    ESRD (end stage renal disease) (CMS HCC)     Chronic    Anemia in chronic kidney disease    LVH (left ventricular hypertrophy)     Chronic    Elevated troponin I level     Chronic    Congestive heart failure (CMS HCC)    Chronic chest pain     Chronic    Uncontrolled hypertension     Chronic    Secondary hyperparathyroidism of renal origin (CMS HCC)    COPD (chronic obstructive pulmonary disease) (CMS HCC)       A/P:  Coronary artery disease  Angina  Hypertensive urgency  Back pain  End-stage renal disease  Non adherence to recommended medical therapy  Chronic heart failure preserved ejection fraction  History of SVC syndrome    Will give patient Dilaudid for her pain  Hoping that she improvement in her pain will also benefit her blood pressure.  However if blood pressure does not improve with pain control will likely titrate off nitroglycerin and start nitroprusside.  Again historically appears her blood pressure responded better to afterload reducers.  She is already on significant doses of labetalol.  She has her gotten chest pain with the nicardipine.  Therefore I think best option at this point time to be nitroprusside if the pain control does not improve her blood pressure.  Awaiting transfer to [facility] for cardiac intervention  Continue other  outpatient medications.    IS PATIENT CRITICALLY ILL?  Is there a high potential of sudden, clinically significant, or life threatening deterioration? yes  Is there a need for direct personal assessment and management to treat/prevent multiple vital organ failure/deterioration?  yes  If this patient is not critically ill, I certify the patient requires continued in-patient hospitalization         I personally performed 35 min of aggregate critical care time exclusive of procedures and teaching.  This includes time spent during direct patient evaluation and reassessment, interpreting diagnostic tests, directing life and/or organ supporting interventions and documentation on the unit.

## 2021-10-03 NOTE — Nurses Notes (Signed)
Patient received in [place] from cath lab status post cardiac catheterization. Accessed through right femoral, op site C/D/I and color/temperature/sensation equal in bilateral lower extremities. Pulses present bilaterally Patient denies chest pain at this time. Complaining of back pain an rates 8/10. Patient in supine position and to remain until 4pm. 3L NC in use. Pt remains hypertensive with nitroglycerin gtt infusing and being titrated per protocol. Right subclavian permacath noted. Awaiting bed placement at [facility] for possible bypass.     Lesleigh Noe, RN

## 2021-10-03 NOTE — Nurses Notes (Signed)
Pt c/o lower back pain and spasms. Flexeril 5mg  po given for said back pain at 1205, pt restless and agitated. Offered ordered Percocet 5mg . Pt refused stating causes "itching for > 24 hours" BP and HR elevated. Pt educated importance of laying still and leaving head flat on pillow. Bleeding risk informed.

## 2021-10-03 NOTE — Discharge Summary (Signed)
[facility]  DISCHARGE SUMMARY    PATIENT NAME:  [name], [name]  MRN:  [id number]  DOB:  05/24/1975    ENCOUNTER DATE:  09/30/2021  INPATIENT ADMISSION DATE: 09/30/2021  DISCHARGE DATE:  10/03/2021    ATTENDING PHYSICIAN: Alvina Filbert, MD  SERVICE: PRN HOSPITALIST INTENSIVIST  PRIMARY CARE PHYSICIAN: Cam Hai, FNP       No lay caregiver identified.    PRIMARY DISCHARGE DIAGNOSIS: Hypertension  Active Hospital Problems    Diagnosis Date Noted    Principal Problem: Hypertension [I10] 09/30/2021    ESRD (end stage renal disease) (CMS [place]) [N18.6] 09/29/2021    Anemia in chronic kidney disease [N18.9, D63.1] 08/05/2021    LVH (left ventricular hypertrophy) [I51.7] 07/01/2021    Elevated troponin I level [R77.8] 06/30/2021    Congestive heart failure (CMS HCC) [I50.9] 06/30/2021    Chronic chest pain [R07.9, G89.29] 06/27/2021    Uncontrolled hypertension [I10] 12/04/2020    Secondary hyperparathyroidism of renal origin (CMS [place]) [N25.81] 10/22/2020    COPD (chronic obstructive pulmonary disease) (CMS [place]) [J44.9] 04/08/2014      Resolved Hospital Problems   No resolved problems to display.     Active Non-Hospital Problems    Diagnosis Date Noted    Hypertensive urgency 09/29/2021    Tobacco use disorder 08/31/2021    Hemoptysis 08/30/2021    Acute respiratory failure with hypoxia (CMS HCC) 08/28/2021    Gastroparesis 08/28/2021    Fluid overload 08/11/2021    Facial swelling 08/11/2021    Hypervolemia associated with renal insufficiency 08/05/2021    Coronary artery disease involving native coronary artery 07/01/2021    Hypertensive heart disease 07/01/2021    Mild aortic stenosis 07/01/2021    Noncompliance 07/01/2021    Nausea 06/30/2021    Tobacco dependence 12/04/2020    GERD (gastroesophageal reflux disease) 12/04/2020    Insomnia 12/04/2020    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        Details   cyanocobalamin 1,000 mcg Tablet  Commonly known as: VITAMIN B 12   1,000 mcg, Oral, DAILY WITH LUNCH  Refills: 0     diphenhydrAMINE 50 mg Capsule  Commonly known as: BENADRYL   50 mg, Oral, EVERY 6 HOURS PRN  Refills: 0     epoetin alfa-epbx 10,000 unit/mL Solution  Commonly known as: RETACRIT   10,000 Units, Intravenous, GIVE IN DIALYSIS  Refills: 0     folic acid 1 mg Tablet  Commonly known as: FOLVITE   1 mg, Oral, DAILY  Refills: 0     HYDROmorphone 2 mg/mL Syringe  Commonly known as: DILAUDID   0.4 mg, Intravenous, EVERY 4 HOURS PRN  Refills: 0     ipratropium-albuteroL 0.5 mg-3 mg(2.5 mg base)/3 mL nebulizer solution  Commonly known as: DUONEB   3 mL, Nebulization, 4 TIMES DAILY PRN  Refills: 0     iron sucrose 100 mg iron/5 mL Solution  Commonly known as: VENOFER   100 mg, Intravenous, GIVE IN DIALYSIS  Refills: 0  nitroGLYCERIN in 5 % dextrose 50 mg/250 mL (200 mcg/mL) Solution   5 mcg/min (5 mcg/min), Intravenous, CATH LAB CONTINUOUS  Refills: 0     ondansetron 2 mg/mL Solution  Commonly known as: ZOFRAN   4 mg, Intravenous, EVERY 8 HOURS PRN  Refills: 0     oxyCODONE-acetaminophen 5-325 mg Tablet  Commonly known as: PERCOCET   1 Tablet, Oral, EVERY 6 HOURS PRN  Refills: 0            CONTINUE these medications which have CHANGED during your visit.        Details   * labetaloL 300 mg Tablet  Commonly known as: NORMODYNE  What changed: when to take this   300 mg, Oral, 2 TIMES DAILY  Refills: 0     * labetaloL 5 mg/mL Solution  Commonly known as: TRANDATE  What changed: You were already taking a medication with the same name, and this prescription was added. Make sure you understand how and when to take each.   10 mg,  Intravenous, EVERY 1 HOUR PRN  Refills: 0           * This list has 2 medication(s) that are the same as other medications prescribed for you. Read the directions carefully, and ask your doctor or other care provider to review them with you.                CONTINUE these medications - NO CHANGES were made during your visit.        Details   acetaminophen 325 mg Tablet  Commonly known as: TYLENOL   650 mg, Oral, EVERY 4 HOURS PRN  Refills: 0     budesonide-formoteroL 160-4.5 mcg/actuation oral inhaler  Commonly known as: SYMBICORT   2 Puffs, Inhalation, 2 TIMES DAILY  Refills: 0     calcitrioL 0.5 mcg Capsule  Commonly known as: ROCALTROL   1 mcg, Oral, EVERY MO, WE AND FR  Refills: 0     cinacalcet 30 mg Tablet  Commonly known as: SENSIPAR   30 mg, Oral, EVERY EVENING  Refills: 0     cloNIDine 0.3 mg/24 hr Patch Weekly  Commonly known as: CATAPRES-TTS   0.3 mg, Transdermal, EVERY 7 DAYS  Refills: 0     clopidogreL 75 mg Tablet  Commonly known as: PLAVIX   75 mg, Oral, DAILY  Refills: 0     cyclobenzaprine 5 mg Tablet  Commonly known as: FLEXERIL   5 mg, Oral, 2 TIMES DAILY PRN  Refills: 0     dilTIAZem 180 mg Capsule, Sust. Release 24 hr  Commonly known as: CARDIZEM CD   180 mg, Oral, DAILY  Qty: 30 Capsule  Refills: 0     doxazosin 4 mg Tablet  Commonly known as: CARDURA   4 mg, Oral, EVERY EVENING  Refills: 0     furosemide 80 mg Tablet  Commonly known as: LASIX   160 mg, Oral, DAILY, Take after dialysis on dialysis days; otherwise take in morning daily.   Refills: 0     guaiFENesin 600 mg Tablet Extended Release 12hr  Commonly known as: MUCINEX   600 mg, Oral, EVERY 12 HOURS  Refills: 0     Isosorbide Mononitrate 120 mg Tablet Sustained Release 24 hr  Commonly known as: IMDUR   120 mg, Oral, EVERY MORNING  Qty: 30 Tablet  Refills: 0     loratadine 10 mg Tablet  Commonly known as: CLARITIN   10 mg, Oral, DAILY  Refills: 0     montelukast 10 mg Tablet  Commonly known as: SINGULAIR   10 mg, Oral, EVERY  EVENING  Refills: 0     pantoprazole 40 mg Tablet, Delayed Release (E.C.)  Commonly known as: PROTONIX   40 mg, Oral, DAILY  Refills: 0            STOP taking these medications.      albuterol sulfate 90 mcg/actuation oral inhaler  Commonly known as: PROVENTIL or VENTOLIN or PROAIR     AURYXIA ORAL     cloNIDine HCL 0.2 mg Tablet  Commonly known as: CATAPRES     doxycycline hyclate 100 mg Capsule  Commonly known as: VIBRAMYCIN     nitroGLYCERIN 0.4 mg Tablet, Sublingual  Commonly known as: NITROSTAT     ondansetron 4 mg Tablet  Commonly known as: ZOFRAN            ASK your doctor about these medications.        Details   apixaban 2.5 mg Tablet  Commonly known as: ELIQUIS  Ask about: Should I take this medication?   2.5 mg, Oral, 2 TIMES DAILY  Qty: 41 Tablet  Refills: 0            Discharge med list refreshed?  YES     Allergies   Allergen Reactions    Lisinopril Swelling     Tongue and throat swelling    Cardene [Nicardipine]  Other Adverse Reaction (Add comment)     Chest pain     Oxycodone Itching     HOSPITAL PROCEDURE(S):   No orders of the defined types were placed in this encounter.    Surgical/Procedural Cases on this Admission       Case IDs Date Procedure Surgeon Location Status    [id number] 10/03/21 CORONARY ANGIOGRAPHYCORONARY ANGIOGRAPHY Rana, Clair Gulling, MD PRN [facility] INVASIVE LABS Comp          REASON FOR HOSPITALIZATION AND HOSPITAL COURSE   This pt is a 46 y/o female with ESRD on HD and frequent admissions for hypertensive urgency who presented to ER on 09/30/21 c/o cp. She reportedly was scheduled for outpt cardiac cath on 10/03/21. CP was associated with sob. Hypertensive urgency was present on admission and she was admitted to ccu for management of the above and tx with NTG gtt. Cardiology was consulted and she underwent cardiac cath on 10/03/21 showing complete occlusion of RCA with collateral flow as well as lesions in the circumflex and LAD. Cardiology service contacted [facility] regarding  transfer for higher level of care for intervention. She was monitored in ccu after the cath and remained on NTG gtt for cp relief and BP control. Home BP meds have been resumed. She required iv dilaudid prn for cp and tolerated it well. She has been accepted to Dr. Edman Circle service at [facility]. She will be transported via EMS ground with ACLS protocol. Of note, she recently was admitted at [facility] for SVC thrombus and was DC on Eliquis 2.'5mg'$  po bid which is currently on hold for cath today and upcoming procedures. She had bacteremia at that time and completed 6weeks of IV Vanc on 8/7. Nephrology followed throughout this admission and she had HD MWF per regular schedule. Please refer to progress notes for complete details of the admission.     The Hospitalist personally evaluated and examined the patient in conjunction with the MLP and agree with the assessments, treatment plan and disposition of the  patient as recorded by the MLP.       LINES/DRAINS/WOUNDS AT DISCHARGE:   Patient Lines/Drains/Airways Status       Active Line / Dialysis Catheter / Dialysis Graft / Drain / Airway / Wound       Name Placement date Placement time Site Days    Peripheral IV Distal;Left Basilic  (medial side of arm) 09/30/21  2310  -- 2    AV Fistula Left;Upper Arm 12/05/20  1502  -- 302    Dialysis Catheter Cuffed/Tunneled 08/13/21  1316  -- 51    Dialysis Catheter --  --  -- --                    DISCHARGE DISPOSITION:  Transfer to acute care facility.  The patient is being transferred to [facility] due to need for higher level of care.  The benefits of the transfer outweigh the risks, and the patient has consented to the transfer.  The accepting facility has available space, time, and capacities to perform necessary evaluation/treatment.    Intensivist has verified the capability at the accepting facility.  The nearest facility for the procedure was discussed with the patient.  Consideration  for alternative procedures that may be performed at [facility] was considered.  DISCHARGE INSTRUCTIONS:    No discharge procedures on file.       Sherol Dade, PA-C    Copies sent to Care Team         Relationship Specialty Notifications Start End    Deel, Leona Carry, FNP PCP - General NURSE PRACTITIONER  12/04/20     Phone: [phone] Fax: [phone]         [address] [place] [address]            Referring providers can utilize [email or web address] to access their referred [place] patient's information.

## 2021-10-03 NOTE — Consults (Signed)
[facility]   Nephrology Consult      [place], [place], 46 y.o. female  Encounter Start Date:  09/30/2021  Inpatient Admission Date:  09/30/2021  Date of Birth:  17-Aug-1975    Admitting Provider:  Critical care team    Reason for Consult:  End-stage renal disease on hemodialysis    HPI:   [name] is a 46 y.o. 38 American female with history of chest pain all day today.  She is on dialysis and was admitted over the past weekend for chest pain and HTN. She signed out AMA yesterday because 'I was lying there and nobody was doing anything.'  She developed left chest pain this morning admitted to be seen by Cardiology rule out MI patient was seen by Cardiology may have a heart catheterization tomorrow  End-stage renal disease hemodialysis Monday Wednesday Friday with anemia of chronic kidney disease no indication for dialysis today electrolytes within normal not fluid overload next dialysis tomorrow will give her 1 unit of blood because her hemoglobin below 8 check PTH anemia profile treatment per protocol  Essential hypertension keep blood pressure controlled will adjust medications   10-02-2021  Nephrology progress note on this patient within the surgeon of disease hemodialysis Monday Wednesday Friday dialyzed this morning tolerated dialysis very well continue dialysis as scheduled admitted with chest pain seen by Cardiology workup in progress anemia of chronic kidney disease given blood today hemoglobin 7 also anemia profile showed low iron low folic acid low vitamin B12 started on supplements of Benefiber Epogen folic acid and vitamin B12 supplements also secondary hyperparathyroidism started on calcium Rocaltrol continue dialysis as scheduled keep blood pressure and blood sugar control  10-03-2021  Nephrology progress note on this patient with end-stage renal disease hemodialysis Monday Wednesday Friday no indication for dialysis today next dialysis tomorrow admitted with chest pain seen  by Cardiology cardiac catheterization was done today showed multiple lesions she will be sent to [facility] for for the treatment next dialysis tomorrow anemia of chronic kidney disease treatment per protocol    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    Tricuspid valve regurgitation     Uncontrolled hypertension          Medications Prior to Admission       Prescriptions    acetaminophen (TYLENOL) 325 mg Oral Tablet    Take 2 Tablets (650 mg total) by mouth Every 4 hours as needed    albuterol sulfate (PROVENTIL OR VENTOLIN OR PROAIR) 90 mcg/actuation Inhalation oral inhaler    Take 1-2 Puffs by inhalation Every 6 hours as needed    apixaban (ELIQUIS) 2.5 mg Oral Tablet    Take 1 Tablet (2.5 mg total) by mouth Twice daily for 41 doses    budesonide-formoteroL (SYMBICORT) 160-4.5 mcg/actuation Inhalation oral inhaler    Take 2 Puffs by inhalation Twice daily    calcitrioL (ROCALTROL) 0.5 mcg Oral Capsule  Take 2 Capsules (1 mcg total) by mouth Every Monday, Wednesday and Friday    cinacalcet (SENSIPAR) 30 mg Oral Tablet    Take 1 Tablet (30 mg total) by mouth Every evening    cloNIDine (CATAPRES-TTS) 0.3 mg/24 hr Transdermal Patch Weekly    Place 1 Patch (0.3 mg total) on the skin Every 7 days    cloNIDine HCL (CATAPRES) 0.2 mg Oral Tablet    Take 1 Tablet (0.2 mg total) by mouth Twice daily for 30 days    clopidogreL (PLAVIX) 75 mg Oral Tablet    Take 1 Tablet (75 mg total) by mouth Once a day    cyclobenzaprine (FLEXERIL) 5 mg Oral Tablet    Take 1 Tablet (5 mg total) by mouth Twice per day as needed  for Muscle spasms    dilTIAZem (CARDIZEM CD) 180 mg Oral Capsule, Sust. Release 24 hr    Take 1 Capsule (180 mg total) by mouth Once a day for 30 days    doxazosin (CARDURA) 4 mg Oral Tablet    Take 1 Tablet (4 mg total) by mouth Every evening    doxycycline hyclate (VIBRAMYCIN) 100 mg Oral Capsule    Take 1 Capsule (100 mg total) by mouth Twice daily for 5 days    ferric citrate (AURYXIA ORAL)    Take 1 g by mouth Three times daily before meals "Take 2 tablets three times daily before meals and 1 tablet before snacks"    furosemide (LASIX) 80 mg Oral Tablet    Take 2 Tablets (160 mg total) by mouth Once a day Take after dialysis on dialysis days; otherwise take in morning daily.    guaiFENesin (MUCINEX) 600 mg Oral Tablet Extended Release 12hr    Take 1 Tablet (600 mg total) by mouth Every 12 hours    isosorbide mononitrate (IMDUR) 120 mg Oral Tablet Sustained Release 24 hr    Take 1 Tablet (120 mg total) by mouth Every morning for 30 days    labetaloL (NORMODYNE) 300 mg Oral Tablet    Take 1 Tablet (300 mg total) by mouth Every 12 hours for 30 days    loratadine (CLARITIN) 10 mg Oral Tablet    Take 1 Tablet (10 mg total) by mouth Once a day    montelukast (SINGULAIR) 10 mg Oral Tablet    Take 1 Tablet (10 mg total) by mouth Every evening    nitroGLYCERIN (NITROSTAT) 0.4 mg Sublingual Tablet, Sublingual    Place 1 Tablet (0.4 mg total) under the tongue Every 5 minutes for 3 doses for 3 doses over 15 minutes    ondansetron (ZOFRAN) 4 mg Oral Tablet    Take 1 Tablet (4 mg total) by mouth Every 12 hours as needed for Nausea/Vomiting    pantoprazole (PROTONIX) 40 mg Oral Tablet, Delayed Release (E.C.)    Take 1 Tablet (40 mg total) by mouth Once a day          acetaminophen (TYLENOL) tablet, 650 mg, Oral, Q4H PRN  alcohol 62 % (NOZIN NASAL SANITIZER) nasal solution, 1 Each, Each Nostril, 2x/day  [Held by provider] apixaban (ELIQUIS) tablet, 2.5 mg, Oral, 2x/day  budesonide-formoterol (SYMBICORT) 160 mcg-4.5 mcg per  inhalation oral inhaler - "Respiratory to administer", 2 Puff, Inhalation, 2x/day  calcitriol (ROCALTROL) capsule, 1 mcg, Oral, M, W, and F  cinacalcet (SENSIPAR) tablet, 30 mg, Oral, QPM  [START ON 10/04/2021] cloNIDine (CATAPRES-TTS) transdermal patch 0.3 mg, 0.3 mg, Transdermal, Q7 Days  clopidogrel (PLAVIX) 75 mg tablet,  75 mg, Oral, Daily  cyanocobalamin (VITAMIN B12) tablet, 1,000 mcg, Oral, Daily with Lunch  cyclobenzaprine (FLEXERIL) tablet, 5 mg, Oral, 2x/day PRN  dilTIAZem (CARDIZEM CD) 24 hr extended release capsule, 180 mg, Oral, Daily  diphenhydrAMINE (BENADRYL) capsule, 50 mg, Oral, Q6H PRN  doxazosin (CARDURA) tablet, 4 mg, Oral, QPM  epoetin alfa-epbx (RETACRIT) 10,000 units/mL injection, 10,000 Units, Intravenous, Give in Dialysis  folic acid (FOLVITE) tablet, 1 mg, Oral, Daily  furosemide (LASIX) tablet, 160 mg, Oral, DIALYSIS DAILY  guaiFENesin (MUCINEX) extended release tablet - for cough (expectorant), 600 mg, Oral, 2x/day  HYDROmorphone (DILAUDID) 2 mg/mL injection, 0.4 mg, Intravenous, Q4H PRN  ipratropium-albuterol 0.5 mg-3 mg(2.5 mg base)/3 mL Solution for Nebulization, 3 mL, Nebulization, 4x/day PRN  iron sucrose (VENOFER) 100 mg/5 mL injection, 100 mg, Intravenous, Give in Dialysis  isosorbide mononitrate (IMDUR) 24 hr extended release tablet, 120 mg, Oral, QAM  labetalol (NORMODYNE) tablet, 300 mg, Oral, 2x/day  labetalol (TRANDATE) 5 mg/mL injection, 10 mg, Intravenous, Q1H PRN  loratadine (CLARITIN) tablet, 10 mg, Oral, Daily  montelukast (SINGULAIR) 10 mg tablet, 10 mg, Oral, QPM  nitroGLYCERIN 50 mg in 250 ml D5W premix infusion, 5 mcg/min, Intravenous, Cath Lab Continuous  ondansetron (ZOFRAN) 2 mg/mL injection, 4 mg, Intravenous, Q8H PRN  oxyCODONE-acetaminophen (PERCOCET) 5-325mg  per tablet, 1 Tablet, Oral, Q6H PRN  pantoprazole (PROTONIX) delayed release tablet, 40 mg, Oral, Daily      Allergies   Allergen Reactions    Lisinopril Swelling     Tongue and throat swelling    Cardene  [Nicardipine]  Other Adverse Reaction (Add comment)     Chest pain     Oxycodone Itching       ROS:   Constitutional: negative for fevers, chills, sweats, and fatigue  Eyes: negative for visual disturbance, irritation, redness, and icterus  Ears, nose, mouth, throat, and face: negative for hearing loss, tinnitus, ear drainage, nasal congestion, epistaxis, and sore throat  Respiratory: negative for cough, sputum, hemoptysis, wheezing, or dyspnea on exertion  Cardiovascular: negative for chest pain, palpitations, , orthopnea, paroxysmal nocturnal dyspnea, and lower extremity edema  Gastrointestinal: negative for  nausea, vomiting, melena, diarrhea, constipation, and abdominal pain  Genitourinary:negative for frequency, dysuria, nocturia, urinary incontinence, hesitancy, and hematuria  Integument/breast: negative for rash, skin lesion(s), and pruritus  Hematologic/lymphatic: negative for easy bruising, bleeding, and petechiae  Musculoskeletal:negative for myalgias, arthralgias, neck pain, back pain, and muscle weakness  Neurological: negative for headaches, dizziness, seizures, speech problems, tremor, and weakness  Behavioral/Psych: negative for anxiety, behavior problems, mood swings, and sleep disturbance  Endocrine: negative for temperature intolerance  Allergic/Immunologic: negative for urticaria and angioedema      EXAM:  Filed Vitals:    10/03/21 1230 10/03/21 1243 10/03/21 1245 10/03/21 1300   BP: (!) 178/117 (!) 178/117 (!) 174/114 (!) 170/114   Pulse: 92 91 86 95   Resp: (!) 23  15 20    Temp:       SpO2: 94%  92% 97%       Constitutional: Alert and Oriented time person and place . Not in acute distress  HEENT : Vision and hearing grossly normal   Eyes: Conjunctiva clear., Pupils equal and round, reactive to light and accomodation. , Sclera non-icteric.   ENT: Nose without erythema. , Mouth mucous membranes moist.   Neck: JVD negative no thyromegaly or lymphadenopathy and supple, symmetrical, trachea  midline  Respiratory: Clear to auscultation bilaterally. No wheezes, No rales, Good air exchange bilaterally  Cardiovascular: regular rate and  rhythm no murmurs no rub  Gastrointestinal: Soft, non-tender, Bowel sounds normal, No hepatosplenomegaly  Genitourinary: no suprapubic tenderness  Lower Extremities : No edema . Pulses + 4 bilateral   Neurologic: Grossly normal. Speech clear.   Psychiatric: Normal mood and affect,   Labs:         CBC Results Differential Results   Recent Labs     10/01/21  0554 10/02/21  0610 10/03/21  0509   WBC 7.4 8.0 7.8   HGB 7.8* 7.0* 9.5*   HCT 23.5* 21.6* 28.4*    Recent Labs     09/30/21  1831   PMNS 77*   LYMPHOCYTES 10*   MONOCYTES 8   EOSINOPHIL 4   BASOPHILS 2  0.20   PMNABS 7.10   LYMPHSABS 0.90*   MONOSABS 0.70   EOSABS 0.30      BMP Results Other Chemistries Results   Recent Labs     10/01/21  0627 10/02/21  0610 10/03/21  0508   SODIUM 136 135* 136   POTASSIUM 3.9 4.0 4.2   CHLORIDE 97* 100 96*   CO2 28 26 29    BUN 25 35* 25   CREATININE 6.44* 8.51* 6.96*   GFR 8* 5* 7*   ANIONGAP 11 9* 11    Recent Labs     10/01/21  0627 10/02/21  0610 10/03/21  0508   CALCIUM 10.0 9.2 9.9   ALBUMIN 4.1 3.7 4.0   MAGNESIUM 2.2 2.1 2.1      Liver/Pancreas Enzyme Results Blood Gas     Recent Labs     10/01/21  0627 10/02/21  0610 10/03/21  0508   TOTALPROTEIN 7.2 6.9 7.3   ALBUMIN 4.1 3.7 4.0   AST 13 16 17    ALT <7* <7* <7*   ALKPHOS 71 61 70    No results found for this encounter   Cardiac Results    Coags Results     TROPONIN I  Lab Results   Component Value Date    UHCEASTTROPI 0.06 ([place]) 12/13/2020    TROPONINI 72 (H) 10/01/2021    TROPONINI 72 (H) 09/30/2021    TROPONINI 64 (H) 09/30/2021         Recent Labs     10/03/21  0629   INR 1.15          Imaging Studies:    XR AP MOBILE CHEST   Final Result   Findings likely represent pulmonary edema with overall aeration unchanged compared with 09/28/2021         Radiologist location ID: [id number]              Assessment/Plan:  Active  Hospital Problems    Diagnosis    Primary Problem: Hypertension    ESRD (end stage renal disease) (CMS HCC)    Anemia in chronic kidney disease    LVH (left ventricular hypertrophy)    Elevated troponin I level    Congestive heart failure (CMS HCC)    Chronic chest pain    Uncontrolled hypertension    Secondary hyperparathyroidism of renal origin (CMS HCC)    COPD (chronic obstructive pulmonary disease) (CMS HCC)      End-stage renal disease hemodialysis Monday Wednesday Friday no dialysis today no indication will be dialyzed tomorrow as scheduled  Anemia of chronic kidney disease treatment Per protocol  Essential hypertension with uncontrolled blood pressure medications will be adjusted by the critical care tea  Secondary hyperparathyroidism treatment per protocol  Coronary artery  disease catheterization done will be sent to [facility] for further treatment    Richardean Sale, MD

## 2021-10-03 NOTE — Consults (Signed)
[facility]  IP Consult Follow up          Dawn, Malone  Date of Admission:  09/30/2021  Date of Birth:  November 08, 1975  Date of Service:  10/03/2021    Hospital Day:  LOS: 3 days     Chief Complaint: Unstable angina  Subjective:   [name] is a 46 y.o. female With a longstanding history of hypertension, hyperlipidemia, LVH, CKD on dialysis who has recurrent admissions for unstable angina and also couple of times non-ST elevation myocardial infarction.  Every time she goes to dialysis she developed anginal-like chest pains.  Previously stress test has been nondiagnostic 6 months ago but following that she could not have it done because of multiple medical other issues.  Her echo always showed normal LV function with LVH.  Again this time she came with chest pain on dialysis.  Her troponin I high-sensitivity in the 70s.  EKG chronic ST changes with LVH.  Today I took her to the Cath Lab for coronary angiogram.  She has a very tortuous calcified right femoral artery.  She had a three-vessel coronary artery disease with LAD 75% mid vessel segment, LCx multiple 80% lesions and very tortuous calcified vessel with a small area of aneurysm.  OM1 80%.  RCA 100% proximally with distal collaterals from septal branches.  While she was in the Cath Lab and resting quietly with sedation during coronary angiogram, her blood pressure was very high.  So I started her on IV nicardipine.  Just a few minutes later she started having chest pain.  We stopped endocardiac with IV and chest pain resolved.  Which seems like a side effect from the medication.  Hemostasis achieved by manual compression.  Films reviewed by interventional cardiologist who recommended to have transfer for high risk PCI for highly calcified vessels versus evaluation for bypass surgery.  Spoke to Dr. Irineo Axon at [facility] and agreed to transfer the patient for further intervention.    Vital Signs:  Temp (24hrs) Max:36.3 C (97.3 F)      Temperature:  36.3 C (97.3 F)  BP (Non-Invasive): (!) 170/114  MAP (Non-Invasive): 132 mmHG  Heart Rate: 95  Respiratory Rate: 20  SpO2: 97 %    Current Medications:  acetaminophen (TYLENOL) tablet, 650 mg, Oral, Q4H PRN  alcohol 62 % (NOZIN NASAL SANITIZER) nasal solution, 1 Each, Each Nostril, 2x/day  [Held by provider] apixaban (ELIQUIS) tablet, 2.5 mg, Oral, 2x/day  atorvastatin (LIPITOR) tablet, 80 mg, Oral, QPM  budesonide-formoterol (SYMBICORT) 160 mcg-4.5 mcg per inhalation oral inhaler - "Respiratory to administer", 2 Puff, Inhalation, 2x/day  calcitriol (ROCALTROL) capsule, 1 mcg, Oral, M, W, and F  cinacalcet (SENSIPAR) tablet, 30 mg, Oral, QPM  [START ON 10/04/2021] cloNIDine (CATAPRES-TTS) transdermal patch 0.3 mg, 0.3 mg, Transdermal, Q7 Days  clopidogrel (PLAVIX) 75 mg tablet, 75 mg, Oral, Daily  cyanocobalamin (VITAMIN B12) tablet, 1,000 mcg, Oral, Daily with Lunch  cyclobenzaprine (FLEXERIL) tablet, 5 mg, Oral, 2x/day PRN  dilTIAZem (CARDIZEM CD) 24 hr extended release capsule, 180 mg, Oral, Daily  diphenhydrAMINE (BENADRYL) capsule, 50 mg, Oral, Q6H PRN  doxazosin (CARDURA) tablet, 4 mg, Oral, QPM  epoetin alfa-epbx (RETACRIT) 10,000 units/mL injection, 10,000 Units, Intravenous, Give in Dialysis  folic acid (FOLVITE) tablet, 1 mg, Oral, Daily  furosemide (LASIX) tablet, 160 mg, Oral, DIALYSIS DAILY  guaiFENesin (MUCINEX) extended release tablet - for cough (expectorant), 600 mg, Oral, 2x/day  HYDROmorphone (DILAUDID) 2 mg/mL injection, 0.4 mg, Intravenous, Q4H PRN  ipratropium-albuterol 0.5 mg-3  mg(2.5 mg base)/3 mL Solution for Nebulization, 3 mL, Nebulization, 4x/day PRN  iron sucrose (VENOFER) 100 mg/5 mL injection, 100 mg, Intravenous, Give in Dialysis  isosorbide mononitrate (IMDUR) 24 hr extended release tablet, 120 mg, Oral, QAM  labetalol (NORMODYNE) tablet, 300 mg, Oral, 2x/day  labetalol (TRANDATE) 5 mg/mL injection, 10 mg, Intravenous, Q1H PRN  loratadine (CLARITIN) tablet, 10 mg, Oral,  Daily  montelukast (SINGULAIR) 10 mg tablet, 10 mg, Oral, QPM  nitroGLYCERIN 50 mg in 250 ml D5W premix infusion, 5 mcg/min, Intravenous, Cath Lab Continuous  ondansetron (ZOFRAN) 2 mg/mL injection, 4 mg, Intravenous, Q8H PRN  oxyCODONE-acetaminophen (PERCOCET) 5-325mg  per tablet, 1 Tablet, Oral, Q6H PRN  pantoprazole (PROTONIX) delayed release tablet, 40 mg, Oral, Daily            Today's Physical Exam:  Physical Exam  Vitals reviewed.   Constitutional:       General: She is not in acute distress.     Appearance: She is ill-appearing. She is not toxic-appearing.   HENT:      Head: Normocephalic.      Mouth/Throat:      Mouth: Mucous membranes are moist.   Neck:      Comments: Jugular venous pressure not elevated.  Cardiovascular:      Rate and Rhythm: Normal rate and regular rhythm.      Pulses: Normal pulses.      Heart sounds: Murmur heard.     No gallop.      Comments: Ejection systolic murmur in the aortic area.  S4 present  Pulmonary:      Effort: Pulmonary effort is normal. No respiratory distress.      Breath sounds: Rales present. No wheezing.   Abdominal:      General: Abdomen is flat. Bowel sounds are normal. There is no distension.      Palpations: Abdomen is soft.      Tenderness: There is no abdominal tenderness. There is no guarding.   Musculoskeletal:      Cervical back: No rigidity.      Right lower leg: No edema.      Left lower leg: No edema.   Skin:     General: Skin is warm and dry.      Coloration: Skin is not jaundiced.      Findings: No bruising.   Neurological:      Mental Status: She is alert and oriented to person, place, and time.   Psychiatric:         Mood and Affect: Mood normal.            I/O:  I/O last 24 hours:    Intake/Output Summary (Last 24 hours) at 10/03/2021 1746  Last data filed at 10/03/2021 1312  Gross per 24 hour   Intake 85.51 ml   Output --   Net 85.51 ml     I/O current shift:  08/10 0700 - 08/10 1859  In: 85.51 [I.V.:85.51]  Out: -       Labs  Please indicate ordered  or reviewed)  Reviewed:     BMP:    Basic Metabolic Profile    Lab Results   Component Value Date/Time    SODIUM 136 10/03/2021 05:08 AM    POTASSIUM 4.2 10/03/2021 05:08 AM    CHLORIDE 96 (L) 10/03/2021 05:08 AM    CO2 29 10/03/2021 05:08 AM    ANIONGAP 11 10/03/2021 05:08 AM    Lab Results   Component Value Date/Time  BUN 25 10/03/2021 05:08 AM    CREATININE 6.96 (H) 10/03/2021 05:08 AM    GLUCOSENF 81 10/03/2021 05:08 AM          CMP: @LASTCMP @   CBC: COMPLETE BLOOD COUNT   Lab Results   Component Value Date    WBC 7.8 10/03/2021    HGB 9.5 (L) 10/03/2021    HCT 28.4 (L) 10/03/2021    PLTCNT 238 10/03/2021       DIFFERENTIAL  Lab Results   Component Value Date    PMNS 77 (H) 09/30/2021    LYMPHOCYTES 10 (L) 09/30/2021    MONOCYTES 8 09/30/2021    EOSINOPHIL 4 09/30/2021    BASOPHILS 2 09/30/2021    BASOPHILS 0.20 09/30/2021    PMNABS 7.10 09/30/2021    LYMPHSABS 0.90 (L) 09/30/2021    EOSABS 0.30 09/30/2021    MONOSABS 0.70 09/30/2021        Hepatic Function:   Hepatic Function    Lab Results   Component Value Date/Time    ALBUMIN 4.0 10/03/2021 05:08 AM    TOTALPROTEIN 7.3 10/03/2021 05:08 AM    ALKPHOS 70 10/03/2021 05:08 AM    PROTHROMTME 13.3 (H) 10/03/2021 06:29 AM    INR 1.15 10/03/2021 06:29 AM    Lab Results   Component Value Date/Time    AST 17 10/03/2021 05:08 AM    ALT <7 (L) 10/03/2021 05:08 AM    BILIRUBINCON 0.2 08/12/2021 05:23 PM             Magnesium: MAGNESIUM  Lab Results   Component Value Date    MAGNESIUM 2.1 10/03/2021        PT: COAGULATION TESTS  Lab Results   Component Value Date    PROTHROMTME 13.3 (H) 10/03/2021    APTT 47.5 (H) 10/03/2021    INR 1.15 10/03/2021    HEMOGLOBIN 14.6 08/12/2021    HCT 28.4 (L) 10/03/2021    PLTCNT 238 10/03/2021          INR:     Recent Labs     10/03/21  0629   INR 1.15     PTT:     Recent Labs     10/03/21  0629   APTT 47.5*     TROPONIN I  Lab Results   Component Value Date    UHCEASTTROPI 0.06 ([place]) 12/13/2020    TROPONINI 72 (H) 10/01/2021     TROPONINI 72 (H) 09/30/2021    TROPONINI 64 (H) 09/30/2021          Lab Results   Component Value Date    BNP 1,719 (H) 08/12/2021        Last 3 Cardiac Markers: No results found for this encounter  Blood Gas: No results found for this encounter  Lipid Panel:  No results found for this encounter  TSH:  No results found for this encounter              Radiology Tests              Problem List:  Unstable angina with severe three-vessel coronary artery disease.  Resistant hypertension  LVH  CKD on dialysis  Chronic anemia      Plan: Patient had severe three-vessel coronary disease.  Arteries are calcified and very tortuous.  High risk intervention necessary.  Patient is going to be [facility] for evaluation for PCI versus CABG.  Meanwhile we must control the blood pressure aggressively.  She could not take  nicardipine because of chest pain with IV nicardipine every time will start.  Maximize labetalol.  She is on Cardizem.  Start Lipitor 80 mg a day.  Continue Plavix for now.  IV nitroglycerin.      Sherlyn Lees, MD, [facility]

## 2021-10-03 NOTE — Sedation Documentation (Addendum)
10/03/21    Procedure:  Coronary angiogram    Diagnosis:  Unstable angina    Sedation Informed Consent, pre-sedation risk assessment and evaluation completed.  History of previous adverse experiences with sedation/analgesia assessed.  Monitored conscious sedation was administered under my direct supervision by an appropriately trained sedation nurse.  Appropriate Facility and equipment compliant.    Sedation time:  8:54 a.m. start time, 9:26 end time.  Patient received 4 IV Versed and 50 fentanyl.  She did well.  She is stable for rhythm, oxygen saturation and blood pressure                  Staff:  TEE/Cardiovascular             Sedation and Procedure Times:                       Aldrete Scores    Pre Sedation  @FLOW :100307:last@  Respiration: 2-->able to breathe and cough freely  Circulation: 2-->BP within 20% of pre-anesthetic level  Consciousness: 2-->fully awake  O2 Saturation: 1-->needs O2 inhalation to maintain O2 saturation greater than 90%  Dressing: 2-->dry and clean or not applicable  Pain: 2-->pain free  Ambulation: 2-->able to stand up and walk straight, on ordered bedrest, or performing at previous level of functioning  Fasting/Feeding: 2-->able to drink fluids or NPO, minimal nausea/ no vomiting  Urine Output: 2-->has voided, adequate urine output per device, or not applicable  Modified Aldrete Score: 19    Post Sedation                                                                          Sherlyn Lees, MD

## 2021-10-03 NOTE — Nurses Notes (Signed)
Report called to Estevan Ryder, RN. Pt will be transferred to [place]. Unit nurse will call when she returns from transferring another pt to floor

## 2021-10-03 NOTE — Nurses Notes (Signed)
Pt to cath lab holding area. When asked if there's anyone we can update on her status, pt replied no,

## 2021-10-03 NOTE — Progress Notes (Signed)
[facility]    HOSPITALIST PROGRESS NOTE    [name]  Date of service: 10/03/2021  Date of Admission:  09/30/2021  Hospital Day:  LOS: 3 days     Subjective:   Patient status post cardiac catheterization today.  She continues to report intermittent chest pain which occurred when she was on nicardipine drip.  Currently on nitroglycerin drip due to uncontrolled blood pressure.  No other new complaints reported s/p cardiac cath.    Vital Signs:  Filed Vitals:    10/03/21 0816 10/03/21 1100 10/03/21 1115 10/03/21 1130   BP:  (!) 179/103 (!) 156/122 (!) 158/117   Pulse: 95 88 81 87   Resp:  16 13 15    Temp:       SpO2:  97% 97% 97%        Physical Exam:  General:  Patient in NAD, resting in bed, no visitors present  Head:  Normocephalic, atraumatic  Eyes:  PERRL, anicteric sclera  ENT:  Oral mucosa moist, no nasal discharge   Neck:  Soft, supple, trachea midline.  PermCath right infraclavicular area  Heart:  RRR, S1 and S2 normal  Lungs:  Unlabored respirations.  Lungs are clear to auscultation bilaterally, with no wheezes, no rales, no conversational dyspnea  Abdomen:  Soft, active bowel sounds, non-tender to palpation, non-distended  Extremities:  Pulses equal bilaterally.  Capillary refill less than 3 seconds.  No edema in lower extremities bilaterally.  AV fistula left arm.  Skin:  Warm and dry, not diaphoretic.  No ecchymosis noted.   Neuro:  A&O x 3.  No focal deficits.  Speech intact  Psych:  Cooperative, not agitated    Intake & Output:  No intake or output data in the 24 hours ending 10/03/21 1221  I/O current shift:  No intake/output data recorded.  Emesis:    BM:    Date of Last Bowel Movement: 10/03/21  Heme:      acetaminophen (TYLENOL) tablet, 650 mg, Oral, Q4H PRN  alcohol 62 % (NOZIN NASAL SANITIZER) nasal solution, 1 Each, Each Nostril, 2x/day  [Held by provider] apixaban (ELIQUIS) tablet, 2.5 mg, Oral, 2x/day  budesonide-formoterol (SYMBICORT) 160 mcg-4.5 mcg per  inhalation oral inhaler - "Respiratory to administer", 2 Puff, Inhalation, 2x/day  calcitriol (ROCALTROL) capsule, 1 mcg, Oral, M, W, and F  cinacalcet (SENSIPAR) tablet, 30 mg, Oral, QPM  [START ON 10/04/2021] cloNIDine (CATAPRES-TTS) transdermal patch 0.3 mg, 0.3 mg, Transdermal, Q7 Days  clopidogrel (PLAVIX) 75 mg tablet, 75 mg, Oral, Daily  cyanocobalamin (VITAMIN B12) tablet, 1,000 mcg, Oral, Daily with Lunch  cyclobenzaprine (FLEXERIL) tablet, 5 mg, Oral, 2x/day PRN  dilTIAZem (CARDIZEM CD) 24 hr extended release capsule, 180 mg, Oral, Daily  diphenhydrAMINE (BENADRYL) capsule, 50 mg, Oral, Q6H PRN  doxazosin (CARDURA) tablet, 4 mg, Oral, QPM  epoetin alfa-epbx (RETACRIT) 10,000 units/mL injection, 10,000 Units, Intravenous, Give in Dialysis  folic acid (FOLVITE) tablet, 1 mg, Oral, Daily  furosemide (LASIX) tablet, 160 mg, Oral, DIALYSIS DAILY  guaiFENesin (MUCINEX) extended release tablet - for cough (expectorant), 600 mg, Oral, 2x/day  ipratropium-albuterol 0.5 mg-3 mg(2.5 mg base)/3 mL Solution for Nebulization, 3 mL, Nebulization, 4x/day PRN  iron sucrose (VENOFER) 100 mg/5 mL injection, 100 mg, Intravenous, Give in Dialysis  isosorbide mononitrate (IMDUR) 24 hr extended release tablet, 120 mg, Oral, QAM  labetalol (NORMODYNE) tablet, 300 mg, Oral, 2x/day  labetalol (TRANDATE) 5 mg/mL injection, 10 mg, Intravenous, Q1H PRN  loratadine (CLARITIN) tablet, 10 mg, Oral, Daily  montelukast (SINGULAIR) 10 mg tablet, 10 mg, Oral, QPM  ondansetron (ZOFRAN) 2 mg/mL injection, 4 mg, Intravenous, Q8H PRN  oxyCODONE-acetaminophen (PERCOCET) 5-325mg  per tablet, 1 Tablet, Oral, Q6H PRN  pantoprazole (PROTONIX) delayed release tablet, 40 mg, Oral, Daily          Labs:  No results found for this or any previous visit (from the past 48 hour(s)).   No results found for this or any previous visit (from the past 48 hour(s)).   Recent Results (from the past 48 hour(s))   COMPREHENSIVE METABOLIC PANEL, NON-FASTING    Collection  Time: 10/03/21  5:08 AM   Result Value    ALKALINE PHOSPHATASE 70    ALT (SGPT) <7 (L)    AST (SGOT) 17   COMPREHENSIVE METABOLIC PANEL, NON-FASTING    Collection Time: 10/02/21  6:10 AM   Result Value    ALKALINE PHOSPHATASE 61    ALT (SGPT) <7 (L)    AST (SGOT) 16      Results for orders placed or performed during the hospital encounter of 09/30/21 (from the past 48 hour(s))   TROPONIN-I    Collection Time: 10/01/21  1:08 PM   Result Value    TROPONIN I 72 (H)      Recent Results (from the past 48 hour(s))   PTT (PARTIAL THROMBOPLASTIN TIME)    Collection Time: 10/03/21  6:29 AM   Result Value    APTT 47.5 (H)   PT/INR    Collection Time: 10/03/21  6:29 AM   Result Value    PROTHROMBIN TIME 13.3 (H)    INR 1.15      Results for orders placed or performed during the hospital encounter of 08/12/21 (from the past 1344 hour(s))   HGA1C (HEMOGLOBIN A1C WITH EST AVG GLUCOSE)    Collection Time: 08/12/21  5:23 PM   Result Value    HEMOGLOBIN A1C 4.8    ESTIMATED AVERAGE GLUCOSE 91      No results found for this or any previous visit (from the past 48 hour(s)).     Microbiology:  Hospital Encounter on 09/30/21 (from the past 96 hour(s))   URINE CULTURE,ROUTINE    Collection Time: 09/30/21  8:21 PM    Specimen: Urine, Clean Catch   Culture Result Status    URINE CULTURE (A) Preliminary     20,000 CFU/mL Staphylococcus intermedius, Coagulase negative Staphylococcus       Susceptibility    Staphylococcus intermedius, Coagulase negative Staphylococcus - MIC SUSCEPTIBILITY     Amoxicillin/clauvulanate        Ampicillin        Ampicillin/Sulbactam        Cefoxitin Screen        Ceftriaxone        Ciprofloxacin <=1 Sensitive      Clindamycin        Daptomycin        Erythromycin        Gentamicin <=4 Sensitive      Gentamicin High Level Synergy        Inducible Clindamycin Resistance        Levofloxacin <=1 Sensitive      Linezolid        Moxifloxacin        Nitrofurantoin <=32 Sensitive      Oxacillin >2 Resistant       Penicillin        Rifampin <=1 Sensitive      Streptomycin 2000  Synercid        Tetracycline >8 Resistant      Trimethoprim/Sulfamethoxazole <=0.5/9.5 Sensitive      Vancomycin 2 Sensitive     Narrative    SENSITIVITY (MIC) TO FOLLOW           Imaging:   INVASIVE CARDIOLOGY PROCEDURE  Severe three-vessel coronary artery disease.  Completely occluded right   coronary artery proximally.  PDA fills via collaterals from the septal   branches.  Left main coronary artery has 30% distal disease.    Left anterior descending artery large heavily calcified tortuous vessels   with 65-75% stenosis in the mid segment.  Left circumflex coronary artery extremely tortuous vessel.  70% proximal   followed by aneurysmal segment and then 90% mid segment at a bend.  Distal   mild plaques.    Obtuse marginal branch 95% stenosis.  Suggestion:  Transfer to tertiary care center for PCI versus CABG        Assessment/ Plan:   Active Hospital Problems   (*Primary Problem)    Diagnosis    *Hypertension    ESRD (end stage renal disease) (CMS HCC)     Chronic    Anemia in chronic kidney disease    LVH (left ventricular hypertrophy)     Chronic    Elevated troponin I level     Chronic    Congestive heart failure (CMS HCC)    Chronic chest pain     Chronic    Uncontrolled hypertension     Chronic    Secondary hyperparathyroidism of renal origin (CMS HCC)    COPD (chronic obstructive pulmonary disease) (CMS [place])     46 year old female history of end-stage renal disease on hemodialysis, hypertension coronary artery disease and diabetes mellitus admitted with hypertensive urgency status post cardiac catheterization showing multivessel disease needing further evaluation for CABG.    -Hypertensive urgency  Blood pressure still uncontrolled.  Patient current on nitroglycerin drip.  She will be transferred to MICU for closer monitoring.    -Coronary artery disease  Patient status post cardiac catheterization.  See procedure report for full  details.  As per discussion with the Cardiologist she has significant vessel disease needing further evaluation for PCI versus CABG.  She continues to have intermittent chest pain while on nicardipine drip which will be avoided as per cardiology service  She will be transferred to MICU for closer monitoring while on nitroglycerin drip while awaiting transfer to [facility].  As per Cardiology Service accepting physician is Dr. Irineo Axon.  Eliquis had been on hold prior to cardiac catheterization  Continue with Cardizem, Imdur and Plavix    -End-stage renal disease on hemodialysis  Nephrology follow-up and evaluation    -Diabetes mellitus  Insulin sliding scale fingerstick blood sugar a.c. HS    -Recent bacteremia  Completed treatment with IV vanco.    For other chronic conditions, will resume home medications upon medication reconciliation.    Disposition Planning:  Patient transferred to MICU for closer monitoring status post catheterization.  Per cardiology service patient awaits transfer to [facility] for further evaluation for CABG.    Norman Herrlich, MD  10/03/2021  [facility] HOSPITALIST

## 2021-10-03 NOTE — Nurses Notes (Signed)
Pt transported via stretcher to [facility] with possessions including, 2 cell phones, tablet, charger, blankets, clothing, slippers, O2 tank and holder, rollator walker. Pt transferred from stretcher to bed with nurse assist. Receiving nurse, Lesleigh Noe, in to greet pt. Right femoral cath site remains soft, with no s/s bleeding, oozing, hematoma. Opsite present. Color, temp, sensation equal bilaterally. Nitro drip remains in use titrated for BP. Fall precautions in place

## 2021-10-04 LAB — ECG 12 LEAD
Atrial Rate: 104 {beats}/min
Atrial Rate: 102 {beats}/min
Calculated P Axis: 66 degrees
Calculated P Axis: 70 degrees
Calculated R Axis: -22 degrees
Calculated R Axis: 70 degrees
Calculated T Axis: 100 degrees
Calculated T Axis: 80 degrees
PR Interval: 202 ms
PR Interval: 202 ms
QRS Duration: 104 ms
QRS Duration: 102 ms
QT Interval: 368 ms
QT Interval: 360 ms
QTC Calculation: 483 ms
QTC Calculation: 469 ms
Ventricular rate: 104 {beats}/min
Ventricular rate: 102 {beats}/min

## 2021-10-04 NOTE — Care Management Notes (Signed)
Late entry for 10/03/21.  Received a referral for advance directives.  Patient was awaiting transfer to [facility].  Met with patient and discussed the MPOA form.  Patient chose to complete the MPOA appointing Murriel Hopper and Prudence Davidson as her representatives.  A copy was placed on patient's chart.

## 2021-10-08 LAB — ECG 12 LEAD
Atrial Rate: 87 {beats}/min
Calculated P Axis: 80 degrees
Calculated R Axis: 36 degrees
Calculated T Axis: 81 degrees
PR Interval: 224 ms
QRS Duration: 104 ms
QT Interval: 432 ms
QTC Calculation: 519 ms
Ventricular rate: 87 {beats}/min

## 2021-10-11 NOTE — Telephone Encounter (Signed)
 Patient identification was verified by name and date of birth. Called and LVM for pt regarding home health referral. Call back number left for potential questions.

## 2021-10-11 NOTE — Telephone Encounter (Signed)
 [name] Steffi Damian CHRISTELLA, RN  Will need a new home health order/referral with modalities requested listed on it. Also if there is any idea of who provided the patient's prior home health.         Thanks   Lamar

## 2021-10-12 ENCOUNTER — Encounter (HOSPITAL_BASED_OUTPATIENT_CLINIC_OR_DEPARTMENT_OTHER): Payer: Self-pay

## 2021-10-12 ENCOUNTER — Emergency Department (HOSPITAL_BASED_OUTPATIENT_CLINIC_OR_DEPARTMENT_OTHER): Payer: Commercial Managed Care - PPO

## 2021-10-12 ENCOUNTER — Other Ambulatory Visit: Payer: Self-pay

## 2021-10-12 ENCOUNTER — Emergency Department (EMERGENCY_DEPARTMENT_HOSPITAL)
Admission: EM | Admit: 2021-10-12 | Discharge: 2021-10-12 | Disposition: A | Discharge status: Home / Self Care | Payer: Commercial Managed Care - PPO | Source: Home / Self Care | Attending: FAMILY PRACTICE | Admitting: FAMILY PRACTICE

## 2021-10-12 DIAGNOSIS — R9431 Abnormal electrocardiogram [ECG] [EKG]: Secondary | ICD-10-CM | POA: Insufficient documentation

## 2021-10-12 DIAGNOSIS — I150 Renovascular hypertension: Secondary | ICD-10-CM

## 2021-10-12 DIAGNOSIS — Z992 Dependence on renal dialysis: Secondary | ICD-10-CM

## 2021-10-12 DIAGNOSIS — N186 End stage renal disease: Secondary | ICD-10-CM | POA: Insufficient documentation

## 2021-10-12 DIAGNOSIS — I2510 Atherosclerotic heart disease of native coronary artery without angina pectoris: Secondary | ICD-10-CM | POA: Insufficient documentation

## 2021-10-12 DIAGNOSIS — I120 Hypertensive chronic kidney disease with stage 5 chronic kidney disease or end stage renal disease: Secondary | ICD-10-CM

## 2021-10-12 DIAGNOSIS — R079 Chest pain, unspecified: Secondary | ICD-10-CM

## 2021-10-12 DIAGNOSIS — Z955 Presence of coronary angioplasty implant and graft: Secondary | ICD-10-CM

## 2021-10-12 LAB — CBC WITH DIFF
BASOPHIL #: 0.07 10*3/uL (ref 0.00–0.30)
BASOPHIL %: 1 % (ref 0–3)
EOSINOPHIL #: 0.22 10*3/uL (ref 0.00–0.80)
EOSINOPHIL %: 2 % (ref 0–7)
HCT: 29.3 % — ABNORMAL LOW (ref 37.0–47.0)
HGB: 9.3 g/dL — ABNORMAL LOW (ref 12.5–16.0)
LYMPHOCYTE #: 1.08 10*3/uL — ABNORMAL LOW (ref 1.10–5.00)
LYMPHOCYTE %: 9 % — ABNORMAL LOW (ref 25–45)
MCH: 28.7 pg (ref 27.0–32.0)
MCHC: 31.6 g/dL — ABNORMAL LOW (ref 32.0–36.0)
MCV: 90.6 fL (ref 78.0–99.0)
MONOCYTE #: 1.03 10*3/uL (ref 0.00–1.30)
MONOCYTE %: 9 % (ref 0–12)
MPV: 8 fL (ref 7.4–10.4)
NEUTROPHIL #: 9.63 10*3/uL — ABNORMAL HIGH (ref 1.80–8.40)
NEUTROPHIL %: 80 % — ABNORMAL HIGH (ref 40–76)
PLATELETS: 413 10*3/uL (ref 140–440)
RBC: 3.23 10*6/uL — ABNORMAL LOW (ref 4.20–5.40)
RDW: 21.4 % — ABNORMAL HIGH (ref 11.6–14.8)
WBC: 12 10*3/uL — ABNORMAL HIGH (ref 4.0–10.5)

## 2021-10-12 LAB — ECG 12 LEAD
Atrial Rate: 67 {beats}/min
Calculated P Axis: 66 degrees
Calculated R Axis: -30 degrees
Calculated T Axis: 88 degrees
PR Interval: 240 ms
QRS Duration: 108 ms
QT Interval: 446 ms
QTC Calculation: 471 ms
Ventricular rate: 67 {beats}/min

## 2021-10-12 LAB — COMPREHENSIVE METABOLIC PANEL, NON-FASTING
ALBUMIN/GLOBULIN RATIO: 0.7 — ABNORMAL LOW (ref 0.8–1.4)
ALBUMIN: 3.3 g/dL — ABNORMAL LOW (ref 3.4–5.0)
ALKALINE PHOSPHATASE: 83 U/L (ref 46–116)
ALT (SGPT): 7 U/L (ref <=78)
ANION GAP: 12 mmol/L (ref 4–13)
AST (SGOT): 40 U/L — ABNORMAL HIGH (ref 15–37)
BILIRUBIN TOTAL: 0.6 mg/dL (ref 0.2–1.0)
BUN/CREA RATIO: 4
BUN: 35 mg/dL — ABNORMAL HIGH (ref 7–18)
CALCIUM, CORRECTED: 11.1 mg/dL
CALCIUM: 10.4 mg/dL — ABNORMAL HIGH (ref 8.5–10.1)
CHLORIDE: 95 mmol/L — ABNORMAL LOW (ref 98–107)
CO2 TOTAL: 26 mmol/L (ref 21–32)
CREATININE: 8.72 mg/dL — ABNORMAL HIGH (ref 0.55–1.02)
ESTIMATED GFR: 5 mL/min/{1.73_m2} — ABNORMAL LOW (ref >59)
GLOBULIN: 5
GLUCOSE: 91 mg/dL (ref 74–106)
OSMOLALITY, CALCULATED: 274 mOsm/kg (ref 270–290)
POTASSIUM: 4.8 mmol/L (ref 3.5–5.1)
PROTEIN TOTAL: 8.3 g/dL — ABNORMAL HIGH (ref 6.4–8.2)
SODIUM: 133 mmol/L — ABNORMAL LOW (ref 136–145)

## 2021-10-12 LAB — PTT (PARTIAL THROMBOPLASTIN TIME): APTT: 53.9 seconds — ABNORMAL HIGH (ref 22.0–31.7)

## 2021-10-12 LAB — PT/INR
INR: 1.04 (ref 0.88–1.10)
PROTHROMBIN TIME: 12.1 seconds (ref 9.8–12.7)

## 2021-10-12 LAB — TROPONIN-I
TROPONIN I: 265 ng/L — ABNORMAL HIGH (ref <15)
TROPONIN I: 255 ng/L — ABNORMAL HIGH (ref <15)
TROPONIN I: 241 ng/L — ABNORMAL HIGH (ref <15)

## 2021-10-12 LAB — MAGNESIUM: MAGNESIUM: 2 mg/dL (ref 1.8–2.4)

## 2021-10-12 MED ORDER — HYDRALAZINE 20 MG/ML INJECTION SOLUTION
INTRAMUSCULAR | Status: AC
Start: 2021-10-12 — End: 2021-10-12
  Filled 2021-10-12: qty 1

## 2021-10-12 MED ORDER — HYDRALAZINE 20 MG/ML INJECTION SOLUTION
20.0000 mg | INTRAMUSCULAR | Status: AC
Start: 2021-10-12 — End: 2021-10-12
  Administered 2021-10-12: 20 mg via INTRAVENOUS

## 2021-10-12 MED ORDER — NITROGLYCERIN 2 % TRANSDERMAL OINTMENT - PACKET
1.0000 [in_us] | TOPICAL_OINTMENT | TRANSDERMAL | Status: AC
Start: 2021-10-12 — End: 2021-10-12
  Administered 2021-10-12: 1 [in_us] via TOPICAL

## 2021-10-12 MED ORDER — NITROGLYCERIN 2 % TRANSDERMAL OINTMENT - PACKET
TOPICAL_OINTMENT | TRANSDERMAL | Status: AC
Start: 2021-10-12 — End: 2021-10-12
  Filled 2021-10-12: qty 1

## 2021-10-12 MED ORDER — ASPIRIN 81 MG CHEWABLE TABLET
324.0000 mg | CHEWABLE_TABLET | ORAL | Status: DC
Start: 2021-10-12 — End: 2021-10-12
  Administered 2021-10-12: 0 mg via ORAL

## 2021-10-12 NOTE — ED Provider Notes (Signed)
[facility], [facility] Emergency Department  ED Primary Provider Note  History of Present Illness   Chief Complaint   Patient presents with    Chest Pain      [name] is a 46 y.o. female who had concerns including Chest Pain .  Arrival: The patient arrived by Ambulance    This 46 year old female patient presents emergency room with chest pain pressure and heaviness onset about 9 this morning, presents to the ER with pain rated a 7 of 10.  Her blood pressure was very high initially, as it usually is when she comes in with chest pain, and she had some dyspnea associated with this.  She would no radiation of pain, diaphoresis, palpitations, tachycardia, nausea, vomiting, or weakness.  She does have end-stage renal disease, difficult to control hypertension.  She was recently in [place], had a couple of stents placed an anterior area of the heart, not sure where the stents were placed, and a cardiovascular surgeon reviewed her case and refused to do any surgeries on the posterior side of the heart, stated at there was nothing that he could do.  EMS brought her in, gave her 324 mg of aspirin EN route.  Tobacco, alcohol, vaping, illicit drug use.    Patient is COVID vaccinated with the [place] vaccine, and 1 Moderna booster.    Review of Systems   Pertinent positive and negative ROS as per HPI.  Historical Data   History Reviewed This Encounter:  Patient's past medical surgical social history noted and reviewed.    Physical Exam   ED Triage Vitals   BP (Non-Invasive) 10/12/21 1040 (!) 171/105   Heart Rate 10/12/21 1045 69   Respiratory Rate 10/12/21 1045 (!) 21   Temperature 10/12/21 1100 36.1 C (96.9 F)   SpO2 10/12/21 1045 100 %   Weight 10/12/21 1100 86.2 kg (190 lb)   Height 10/12/21 1100 1.702 m (5' 7" )     Physical Exam  General:  Patient is anxious, appears uncomfortable.    Ears: TMs clear throat for traction  Nasal:  Boggy mucosa, moist patent bilaterally  Oral:  Pink  moist   Pharynx:  Pink moist   Neck:  Supple without JVD or adenopathy.  No accessory muscle use to breathe   Chest wall:  Atraumatic, dialysis catheter noted in the right chest going into the internal jugular  Lungs: Clear symmetrical, good aeration   Heart: Regular rate rhythm without murmur gallop   Abdomen:  Soft normal bowel sounds nontender   Extremities:  Moving symmetrically   Skin:  No suspicious rashes or lesions, trace dependent edema   Neurological:  No focal deficits.  Well best thing that they need to deep [address] matrix do want no vitamin white      Patient Data     Labs Ordered/Reviewed   COMPREHENSIVE METABOLIC PANEL, NON-FASTING - Abnormal; Notable for the following components:       Result Value    SODIUM 133 (*)     CHLORIDE 95 (*)     BUN 35 (*)     CREATININE 8.72 (*)     ESTIMATED GFR 5 (*)     ALBUMIN 3.3 (*)     CALCIUM 10.4 (*)     AST (SGOT) 40 (*)     PROTEIN TOTAL 8.3 (*)     ALBUMIN/GLOBULIN RATIO 0.7 (*)     All other components within normal limits    Narrative:  Estimated Glomerular Filtration Rate (eGFR) is calculated using the CKD-EPI (2021) equation, intended for patients 46 years of age and older. If gender is not documented or "unknown", there will be no eGFR calculation.   TROPONIN-I - Abnormal; Notable for the following components:    TROPONIN I 265 (*)     All other components within normal limits    Narrative:     Values received on females ranging between 12-15 ng/L MUST include the next serial troponin to review changes in the delta differences as the reference range for the Access II chemistry analyzer is lower than the established reference range.     TROPONIN-I - Abnormal; Notable for the following components:    TROPONIN I 255 (*)     All other components within normal limits    Narrative:     Values received on females ranging between 12-15 ng/L MUST include the next serial troponin to review changes in the delta differences as the reference range for the Access II  chemistry analyzer is lower than the established reference range.     TROPONIN-I - Abnormal; Notable for the following components:    TROPONIN I 241 (*)     All other components within normal limits    Narrative:     Values received on females ranging between 12-15 ng/L MUST include the next serial troponin to review changes in the delta differences as the reference range for the Access II chemistry analyzer is lower than the established reference range.     PTT (PARTIAL THROMBOPLASTIN TIME) - Abnormal; Notable for the following components:    APTT 53.9 (*)     All other components within normal limits   CBC WITH DIFF - Abnormal; Notable for the following components:    WBC 12.0 (*)     RBC 3.23 (*)     HGB 9.3 (*)     HCT 29.3 (*)     MCHC 31.6 (*)     RDW 21.4 (*)     NEUTROPHIL % 80 (*)     LYMPHOCYTE % 9 (*)     NEUTROPHIL # 9.63 (*)     LYMPHOCYTE # 1.08 (*)     All other components within normal limits   MAGNESIUM - Normal   PT/INR - Normal   CBC/DIFF    Narrative:     The following orders were created for panel order CBC/DIFF.  Procedure                               Abnormality         Status                     ---------                               -----------         ------                     CBC WITH OVAN[[id number]]                Abnormal            Final result                 Please view results for these tests on the individual orders.     XR  AP MOBILE CHEST   Final Result by Edi, Radresults In (08/19 1055)   FINDING SUGGEST PULMONARY EDEMA FROM CHF AND/OR FLUID OVERLOAD, SIMILAR TO THE PRIOR EXAM 12 DAYS AGO.         Radiologist location ID: [place] Making        Medical Decision Making  Amount and/or Complexity of Data Reviewed  Labs: ordered.  Radiology: ordered.  ECG/medicine tests: ordered and independent interpretation performed.    Risk  OTC drugs.  Prescription drug management.      ED Course as of 10/12/21 1508   Sat Oct 12, 2021   1506 Patient's labs  reviewed, discussed with the patient.  She was controlled, she now has a refills at home waiting for her for her antihypertensives and other medications.  She is wanting to go home, no EKG changes noted, will be discharged home with close follow-up.  She is a Monday Wednesday Friday dialysis patient, did dialysis yesterday and has no dyspnea currently.  She is been pain-free since the hydralazine.  Will discharge so she can get her home medications.         Medications Administered in the ED   aspirin chewable tablet 324 mg (0 mg Oral Not Given 10/12/21 1100)   hydrALAZINE (APRESOLINE) injection 20 mg (20 mg Intravenous Given 10/12/21 1130)   nitroGLYCERIN (NITRO-BID) 2 % topical ointment (1 Inch Apply Topically Given 10/12/21 1042)     Clinical Impression   Chest pain, unspecified type (Primary)   Renovascular hypertension   End-stage renal disease on hemodialysis (CMS HCC)   Atherosclerosis of native coronary artery of native heart, unspecified whether angina present       Disposition: Discharged    .Marland KitchenBretta Bang, DO

## 2021-10-12 NOTE — Discharge Instructions (Addendum)
You chest pain did improve after having the hydralazine lower your blood pressure.  Her troponin is still elevated, but coming down and is most likely elevated from her previous heart catheterization a few days ago as troponin clears very slowly with dialysis.  Continue home medications, renal diet and monitor your blood pressure.  Call follow-up with primary care provider, and nephrologist as scheduled.

## 2021-10-12 NOTE — ED Triage Notes (Signed)
Patient having chest pain started today. Center chest pain without radiation. Had 2 separate stent procedures at [facility]. Patient having pain in groin from surgery.

## 2021-10-15 ENCOUNTER — Inpatient Hospital Stay (HOSPITAL_COMMUNITY): Payer: Commercial Managed Care - PPO | Admitting: Internal Medicine

## 2021-10-15 ENCOUNTER — Encounter (HOSPITAL_COMMUNITY): Payer: Self-pay

## 2021-10-15 ENCOUNTER — Other Ambulatory Visit: Payer: Self-pay

## 2021-10-15 ENCOUNTER — Inpatient Hospital Stay
Admission: EM | Admit: 2021-10-15 | Discharge: 2021-10-21 | DRG: 190 | Disposition: A | Discharge status: Home / Self Care | Payer: Commercial Managed Care - PPO | Attending: Family Medicine | Admitting: Family Medicine

## 2021-10-15 DIAGNOSIS — I5032 Chronic diastolic (congestive) heart failure: Secondary | ICD-10-CM | POA: Diagnosis present

## 2021-10-15 DIAGNOSIS — R0600 Dyspnea, unspecified: Secondary | ICD-10-CM

## 2021-10-15 DIAGNOSIS — Z79899 Other long term (current) drug therapy: Secondary | ICD-10-CM

## 2021-10-15 DIAGNOSIS — K219 Gastro-esophageal reflux disease without esophagitis: Secondary | ICD-10-CM | POA: Diagnosis present

## 2021-10-15 DIAGNOSIS — I160 Hypertensive urgency: Secondary | ICD-10-CM | POA: Diagnosis present

## 2021-10-15 DIAGNOSIS — M545 Low back pain, unspecified: Secondary | ICD-10-CM | POA: Diagnosis present

## 2021-10-15 DIAGNOSIS — Z7951 Long term (current) use of inhaled steroids: Secondary | ICD-10-CM

## 2021-10-15 DIAGNOSIS — Z992 Dependence on renal dialysis: Secondary | ICD-10-CM

## 2021-10-15 DIAGNOSIS — F419 Anxiety disorder, unspecified: Secondary | ICD-10-CM | POA: Diagnosis present

## 2021-10-15 DIAGNOSIS — F17210 Nicotine dependence, cigarettes, uncomplicated: Secondary | ICD-10-CM | POA: Diagnosis present

## 2021-10-15 DIAGNOSIS — Z955 Presence of coronary angioplasty implant and graft: Secondary | ICD-10-CM

## 2021-10-15 DIAGNOSIS — I440 Atrioventricular block, first degree: Secondary | ICD-10-CM

## 2021-10-15 DIAGNOSIS — I509 Heart failure, unspecified: Secondary | ICD-10-CM | POA: Diagnosis present

## 2021-10-15 DIAGNOSIS — J9601 Acute respiratory failure with hypoxia: Secondary | ICD-10-CM | POA: Diagnosis present

## 2021-10-15 DIAGNOSIS — J9621 Acute and chronic respiratory failure with hypoxia: Secondary | ICD-10-CM | POA: Diagnosis present

## 2021-10-15 DIAGNOSIS — I132 Hypertensive heart and chronic kidney disease with heart failure and with stage 5 chronic kidney disease, or end stage renal disease: Secondary | ICD-10-CM | POA: Diagnosis present

## 2021-10-15 DIAGNOSIS — Z9981 Dependence on supplemental oxygen: Secondary | ICD-10-CM

## 2021-10-15 DIAGNOSIS — Z86718 Personal history of other venous thrombosis and embolism: Secondary | ICD-10-CM

## 2021-10-15 DIAGNOSIS — Z91158 Patient's noncompliance with renal dialysis for other reason: Secondary | ICD-10-CM

## 2021-10-15 DIAGNOSIS — I150 Renovascular hypertension: Secondary | ICD-10-CM

## 2021-10-15 DIAGNOSIS — Z7982 Long term (current) use of aspirin: Secondary | ICD-10-CM

## 2021-10-15 DIAGNOSIS — E8770 Fluid overload, unspecified: Secondary | ICD-10-CM | POA: Diagnosis present

## 2021-10-15 DIAGNOSIS — Z7901 Long term (current) use of anticoagulants: Secondary | ICD-10-CM

## 2021-10-15 DIAGNOSIS — D649 Anemia, unspecified: Secondary | ICD-10-CM | POA: Diagnosis present

## 2021-10-15 DIAGNOSIS — I081 Rheumatic disorders of both mitral and tricuspid valves: Secondary | ICD-10-CM | POA: Diagnosis present

## 2021-10-15 DIAGNOSIS — J449 Chronic obstructive pulmonary disease, unspecified: Secondary | ICD-10-CM | POA: Diagnosis present

## 2021-10-15 DIAGNOSIS — J441 Chronic obstructive pulmonary disease with (acute) exacerbation: Principal | ICD-10-CM | POA: Diagnosis present

## 2021-10-15 DIAGNOSIS — I2510 Atherosclerotic heart disease of native coronary artery without angina pectoris: Secondary | ICD-10-CM | POA: Diagnosis present

## 2021-10-15 DIAGNOSIS — G4730 Sleep apnea, unspecified: Secondary | ICD-10-CM | POA: Diagnosis present

## 2021-10-15 DIAGNOSIS — D631 Anemia in chronic kidney disease: Secondary | ICD-10-CM | POA: Diagnosis present

## 2021-10-15 DIAGNOSIS — I252 Old myocardial infarction: Secondary | ICD-10-CM

## 2021-10-15 DIAGNOSIS — Z7902 Long term (current) use of antithrombotics/antiplatelets: Secondary | ICD-10-CM

## 2021-10-15 DIAGNOSIS — N2581 Secondary hyperparathyroidism of renal origin: Secondary | ICD-10-CM | POA: Diagnosis present

## 2021-10-15 DIAGNOSIS — E875 Hyperkalemia: Secondary | ICD-10-CM | POA: Diagnosis present

## 2021-10-15 DIAGNOSIS — N186 End stage renal disease: Secondary | ICD-10-CM | POA: Diagnosis present

## 2021-10-15 LAB — TROPONIN-I
TROPONIN I: 71 ng/L — ABNORMAL HIGH (ref <15)
TROPONIN I: 69 ng/L — ABNORMAL HIGH (ref <15)

## 2021-10-15 LAB — CBC WITH DIFF
BASOPHIL #: 0.1 10*3/uL (ref 0.00–0.30)
BASOPHIL %: 1 % (ref 0–3)
EOSINOPHIL #: 0.4 10*3/uL (ref 0.00–0.80)
EOSINOPHIL %: 4 % (ref 0–7)
HCT: 23.6 % — ABNORMAL LOW (ref 37.0–47.0)
HGB: 7.8 g/dL — ABNORMAL LOW (ref 12.5–16.0)
LYMPHOCYTE #: 0.9 10*3/uL — ABNORMAL LOW (ref 1.10–5.00)
LYMPHOCYTE %: 9 % — ABNORMAL LOW (ref 25–45)
MCH: 28.3 pg (ref 27.0–32.0)
MCHC: 32.9 g/dL (ref 32.0–36.0)
MCV: 85.9 fL (ref 78.0–99.0)
MONOCYTE #: 0.5 10*3/uL (ref 0.00–1.30)
MONOCYTE %: 5 % (ref 0–12)
MPV: 6.6 fL — ABNORMAL LOW (ref 7.4–10.4)
NEUTROPHIL #: 8.2 10*3/uL (ref 1.80–8.40)
NEUTROPHIL %: 81 % — ABNORMAL HIGH (ref 40–76)
PLATELET COMMENT: NORMAL
PLATELETS: 315 10*3/uL (ref 140–440)
RBC: 2.75 10*6/uL — ABNORMAL LOW (ref 4.20–5.40)
RDW: 20.7 % — ABNORMAL HIGH (ref 11.6–14.8)
WBC: 10.1 10*3/uL (ref 4.0–10.5)
WBCS UNCORRECTED: 10.1 10*3/uL

## 2021-10-15 LAB — ECG 12 LEAD
Atrial Rate: 74 {beats}/min
Calculated P Axis: 79 degrees
Calculated R Axis: 51 degrees
Calculated T Axis: 77 degrees
PR Interval: 228 ms
QRS Duration: 106 ms
QT Interval: 420 ms
QTC Calculation: 466 ms
Ventricular rate: 74 {beats}/min

## 2021-10-15 LAB — COMPREHENSIVE METABOLIC PANEL, NON-FASTING
ALBUMIN/GLOBULIN RATIO: 1.1 (ref 0.8–1.4)
ALBUMIN: 4.2 g/dL (ref 3.5–5.7)
ALKALINE PHOSPHATASE: 70 U/L (ref 34–104)
ALT (SGPT): 7 U/L (ref 7–52)
ANION GAP: 14 mmol/L — ABNORMAL HIGH (ref 4–13)
AST (SGOT): 12 U/L — ABNORMAL LOW (ref 13–39)
BILIRUBIN TOTAL: 0.7 mg/dL (ref 0.3–1.2)
BUN/CREA RATIO: 4 — ABNORMAL LOW (ref 6–22)
BUN: 63 mg/dL — ABNORMAL HIGH (ref 7–25)
CALCIUM, CORRECTED: 10.4 mg/dL (ref 8.9–10.8)
CALCIUM: 10.6 mg/dL — ABNORMAL HIGH (ref 8.6–10.3)
CHLORIDE: 95 mmol/L — ABNORMAL LOW (ref 98–107)
CO2 TOTAL: 22 mmol/L (ref 21–31)
CREATININE: 15.05 mg/dL — ABNORMAL HIGH (ref 0.60–1.30)
ESTIMATED GFR: 3 mL/min/{1.73_m2} — ABNORMAL LOW (ref >59)
GLOBULIN: 3.7 (ref 2.9–5.4)
GLUCOSE: 67 mg/dL — ABNORMAL LOW (ref 74–109)
OSMOLALITY, CALCULATED: 279 mOsm/kg (ref 270–290)
POTASSIUM: 5.4 mmol/L — ABNORMAL HIGH (ref 3.5–5.1)
PROTEIN TOTAL: 7.9 g/dL (ref 6.4–8.9)
SODIUM: 131 mmol/L — ABNORMAL LOW (ref 136–145)

## 2021-10-15 LAB — URINALYSIS, MACROSCOPIC
BILIRUBIN: NEGATIVE mg/dL
BLOOD: 0.03 mg/dL
GLUCOSE: NEGATIVE mg/dL
KETONES: NEGATIVE mg/dL
LEUKOCYTES: 500 WBCs/uL — AB
NITRITE: NEGATIVE
PH: 7.5 (ref 5.0–9.0)
PROTEIN: 50 mg/dL — AB
SPECIFIC GRAVITY: 1.009 (ref 1.002–1.030)
UROBILINOGEN: NORMAL mg/dL

## 2021-10-15 LAB — URINALYSIS, MICROSCOPIC
BACTERIA: NEGATIVE /hpf
RBCS: 17 /hpf — ABNORMAL HIGH (ref <4)
SQUAMOUS EPITHELIAL: 4 /hpf (ref <28)
WBCS: 30 /hpf — ABNORMAL HIGH (ref <6)

## 2021-10-15 LAB — PT/INR
INR: 1.35 (ref <=5.00)
PROTHROMBIN TIME: 15.6 seconds — ABNORMAL HIGH (ref 9.8–12.7)

## 2021-10-15 LAB — COVID-19, FLU A/B, RSV RAPID BY PCR
INFLUENZA VIRUS TYPE A: NOT DETECTED
INFLUENZA VIRUS TYPE B: NOT DETECTED
RESPIRATORY SYNCTIAL VIRUS (RSV): NOT DETECTED
SARS-CoV-2: NOT DETECTED

## 2021-10-15 LAB — LACTIC ACID LEVEL W/ REFLEX FOR LEVEL >2.0: LACTIC ACID: 0.4 mmol/L — ABNORMAL LOW (ref 0.5–2.2)

## 2021-10-15 LAB — PTT (PARTIAL THROMBOPLASTIN TIME): APTT: 52.5 seconds — ABNORMAL HIGH (ref 26.0–36.0)

## 2021-10-15 MED ORDER — ACETAMINOPHEN 325 MG TABLET
650.0000 mg | ORAL_TABLET | Freq: Four times a day (QID) | ORAL | Status: DC | PRN
Start: 2021-10-15 — End: 2021-10-21
  Administered 2021-10-21: 650 mg via ORAL
  Filled 2021-10-15: qty 2

## 2021-10-15 MED ORDER — HYDRALAZINE 20 MG/ML INJECTION SOLUTION
INTRAMUSCULAR | Status: AC
Start: 2021-10-15 — End: 2021-10-15
  Filled 2021-10-15: qty 1

## 2021-10-15 MED ORDER — HYDRALAZINE 20 MG/ML INJECTION SOLUTION
20.0000 mg | INTRAMUSCULAR | Status: AC
Start: 2021-10-15 — End: 2021-10-15
  Administered 2021-10-15: 20 mg via INTRAVENOUS

## 2021-10-15 NOTE — ED Triage Notes (Signed)
Shortness of breath and fluid overload - last dialysis was Friday    PRS: monitor, 2L NC cont, BS 73

## 2021-10-15 NOTE — ED Provider Notes (Signed)
[facility]  ED Primary Provider Note  History of Present Illness   Chief Complaint   Patient presents with    Shortness of Breath     [name] is a 46 y.o. female who had concerns including Shortness of Breath.  Arrival: The patient arrived by Ambulance    This 46 year old female patient presents emergency department today with complaints of shortness of breath and fluid overload.  She is end-stage renal disease patient on hemodialysis, had her dialysis on Friday, missed yesterday's dialysis.  She is on portable oxygen when she leaves the house, and has a concentrator at home.  She would run out of her portable oxygen, which was supposed to be delivered yesterday morning, so she missed dialysis because he oxygen showed up after 6:00 a.m. last night per her report to me today.  She is dyspneic, and hypertensive, however, her blood pressure is lower than her usual blood pressures which she comes in to the ER with fluid overload.    Patient is Ingenio vaccinated, got her flu shot last year, has never had COVID her knowledge.    Review of Systems   Pertinent positive and negative ROS as per HPI.  Historical Data   History Reviewed This Encounter:  Your past medical, surgical, social history reviewed noted.    Physical Exam   ED Triage Vitals [10/15/21 1700]   BP (Non-Invasive) (!) 173/84   Heart Rate 77   Respiratory Rate 17   Temperature 37.3 C (99.2 F)   SpO2 93 %   Weight 86.2 kg (190 lb)   Height 1.702 m (_0 )     Physical Exam  General:  No acute distress nontoxic but does appear uncomfortable, sitting up and leaning forward heart rate is 77, 93% oxygen saturations with a respiratory rate of 17 on 2 L of oxygen via nasal cannula.  BP is 173/84 initially and 164/101 when I was in the room.  Eyes:  Conjunctivae is pink, sclera is white   Oral:  Pink and moist   Neck:  Supple without JVD, carotid bruits.  No accessory muscle use to breathe, trachea is in midline    Lungs:  Late expiratory basilar crackles otherwise clear symmetrical with good aeration bilaterally   Heart:  Regular rate rhythm S1-S2 with no obvious murmur gallop.    Abdomen:  Soft nontender normal bowel sounds.    Extremities:  Moving symmetrically   Skin:  No suspicious rashes or lesions, no dependent edema   Neurological:  No gross focal deficits.    Patient Data     Labs Ordered/Reviewed   COMPREHENSIVE METABOLIC PANEL, NON-FASTING - Abnormal; Notable for the following components:       Result Value    SODIUM 131 (*)     POTASSIUM 5.4 (*)     CHLORIDE 95 (*)     ANION GAP 14 (*)     BUN 63 (*)     CREATININE 15.05 (*)     BUN/CREA RATIO 4 (*)     ESTIMATED GFR 3 (*)     CALCIUM 10.6 (*)     GLUCOSE 67 (*)     AST (SGOT) 12 (*)     All other components within normal limits    Narrative:     Estimated Glomerular Filtration Rate (eGFR) is calculated using the CKD-EPI (2021) equation, intended for patients 20 years of age and older. If gender is not documented or "unknown", there will be  no eGFR calculation.   LACTIC ACID LEVEL W/ REFLEX FOR LEVEL >2.0 - Abnormal; Notable for the following components:    LACTIC ACID 0.4 (*)     All other components within normal limits   PTT (PARTIAL THROMBOPLASTIN TIME) - Abnormal; Notable for the following components:    APTT 52.5 (*)     All other components within normal limits   PT/INR - Abnormal; Notable for the following components:    PROTHROMBIN TIME 15.6 (*)     All other components within normal limits    Narrative:     INR OF 2.0-3.0  RECOMMENDED FOR: PROPHYLAXIS/TREATMENT OF VENEOUS THROMBOSIS, PULMONARY EMBOLISM, PREVENTION OF SYSTEMIC EMBOLISM FROM ATRIAL FIBRILATION, MYOCARDIAL INFARCTION.    INR OF 2.5-3.5  RECOMMENDED FOR MECHANICAL PROSTHETIC HEART VALVES, RECURRENT SYSTEMIC EMBOLISM, RECURRENT MYOCARDIAL INFARCTION.     TROPONIN-I - Abnormal; Notable for the following components:    TROPONIN I 71 (*)     All other components within normal limits   CBC WITH  DIFF - Abnormal; Notable for the following components:    RBC 2.75 (*)     HGB 7.8 (*)     HCT 23.6 (*)     RDW 20.7 (*)     MPV 6.6 (*)     NEUTROPHIL % 81 (*)     LYMPHOCYTE % 9 (*)     LYMPHOCYTE # 0.90 (*)     All other components within normal limits   URINALYSIS, MACROSCOPIC - Abnormal; Notable for the following components:    APPEARANCE Turbid (*)     LEUKOCYTES 500 (*)     PROTEIN 50 (*)     All other components within normal limits   URINALYSIS, MICROSCOPIC - Abnormal; Notable for the following components:    RBCS 17 (*)     WBCS 30 (*)     All other components within normal limits   TROPONIN-I - Abnormal; Notable for the following components:    TROPONIN I 69 (*)     All other components within normal limits   COVID-19, FLU A/B, RSV RAPID BY PCR - Normal    Narrative:     Results are for the simultaneous qualitative identification of SARS-CoV-2 (formerly 2019-nCoV), Influenza A, Influenza B, and RSV RNA. These etiologic agents are generally detectable in nasopharyngeal and nasal swabs during the ACUTE PHASE of infection. Hence, this test is intended to be performed on respiratory specimens collected from individuals with signs and symptoms of upper respiratory tract infection who meet Centers for Disease Control and Prevention (CDC) clinical and/or epidemiological criteria for Coronavirus Disease 2019 (COVID-19) testing. CDC COVID-19 criteria for testing on human specimens is available at [facility] webpage information for Healthcare Professionals: Coronavirus Disease 2019 (COVID-19) ([email or web address]).     False-negative results may occur if the virus has genomic mutations, insertions, deletions, or rearrangements or if performed very early in the course of illness. Otherwise, negative results indicate virus specific RNA targets are not detected, however negative results do not preclude SARS-CoV-2 infection/COVID-19, Influenza, or Respiratory syncytial virus infection.  Results should not be used as the sole basis for patient management decisions. Negative results must be combined with clinical observations, patient history, and epidemiological information. If upper respiratory tract infection is still suspected based on exposure history together with other clinical findings, re-testing should be considered.    Disclaimer:   This assay has been authorized by FDA under an Emergency Use Authorization for use in laboratories certified under the Clinical Laboratory Improvement  Amendments of 1988 (CLIA), 42 U.S.C. ([phone], to perform high complexity tests. The impacts of vaccines, antiviral therapeutics, antibiotics, chemotherapeutic or immunosuppressant drugs have not been evaluated.     Test methodology:   Cepheid Xpert Xpress SARS-CoV-2/Flu/RSV Assay real-time polymerase chain reaction (RT-PCR) test on the GeneXpert Dx and Xpert Xpress systems.   CBC/DIFF    Narrative:     The following orders were created for panel order CBC/DIFF.  Procedure                               Abnormality         Status                     ---------                               -----------         ------                     CBC WITH ZHGD[[id number]]                Abnormal            Final result                 Please view results for these tests on the individual orders.   URINALYSIS, MACROSCOPIC AND MICROSCOPIC W/CULTURE REFLEX    Narrative:     The following orders were created for panel order URINALYSIS, MACROSCOPIC AND MICROSCOPIC W/CULTURE REFLEX.  Procedure                               Abnormality         Status                     ---------                               -----------         ------                     URINALYSIS, MACROSCOPIC[[id number]]      Abnormal            Final result               URINALYSIS, MICROSCOPIC[[id number]]      Abnormal            Final result                 Please view results for these tests on the individual orders.     No orders to display     Medical Decision  Making        Medical Decision Making  High complexity due presentation, comorbidities to include end-stage renal disease, hypertension, coronary after sclerosis, recent stent placement    Amount and/or Complexity of Data Reviewed  Labs: ordered.  ECG/medicine tests: ordered.    Risk  Prescription drug management.  Parenteral controlled substances.  Decision regarding hospitalization.      ED Course as of 10/19/21 0821   Tue Oct 15, 2021   1914 EKG at 5:18 p.m., read at 5:20 p.m.: Sinus rhythm with first-degree heart block  with rate of 74, LVH with strain, no STEMI.   1931 Labs were discussed with Dr. Deon Pilling, hospitalist, labs Accept troponin has returned, checking on that, disposition pending         Medications Administered in the ED   hydrALAZINE (APRESOLINE) injection 20 mg (has no administration in time range)     Clinical Impression   Acute dyspnea (Primary)   End stage renal disease on dialysis (CMS [facility])   Atherosclerosis of native coronary artery of native heart without angina pectoris   Renovascular hypertension   Hyperkalemia, diminished renal excretion       Disposition: Admitted    .Marland KitchenBretta Bang, DO

## 2021-10-15 NOTE — ED Nurses Note (Signed)
REPORT CALLED TO NURSING STAFF ON 3 EAST AT THIS TIME.

## 2021-10-15 NOTE — H&P (Signed)
[facility]  History and Physical    Date of Service:  10/15/21   [name], [name], 46 y.o. female  Encounter Start Date:  10/15/2021  Inpatient Admission Date: 10/15/2021  Date of Birth:  1975/12/18  PCP: Dawn Hai, FNP    Chief Complaint:  Shortness of breath    HPI: [name] is a 46 y.o., 24 American female who presents to the emergency department with concerns for increasing shortness of breath.  Patient is end-stage renal disease patient on hemodialysis.  Normally has her dialysis on Monday Wednesday Friday.  Patient did miss yesterday's dialysis.  Patient is on portable oxygen when she leaves her home and has a concentrated at home.  She states that she was out for portable O2 was then unable to get it delivered so she was unable to get her dialysis appointment yesterday.  Apparently on at arrival to the ED.  Patient was somewhat dyspneic and hypertensive.  However blood pressures lower than her usual blood pressure when she comes to the ED with fluid overload.  Patient was given hydralazine in the ED.  Will continue with p.r.n. medications for blood pressure.  Will consult Nephrology for dialysis.  Patient to be placed on telemetry.  Patient's home medications will need to be reconciled once they are completed in the computer.  From patient's last visit patient apparently was sent to [facility] where she would have a cardiac catheterization would have a cardiac stent she says this is on the left side.  And that she is in need of a bypass surgery but at this point there is no plan for this in the future.  Patient had no complaints at time examination.    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    Tricuspid valve regurgitation     Uncontrolled hypertension              Medications Prior to Admission       Prescriptions    acetaminophen (TYLENOL) 325 mg Oral Tablet    Take 2 Tablets (650 mg total) by mouth Every 4 hours as needed    apixaban (ELIQUIS) 2.5 mg Oral Tablet    Take 1 Tablet (2.5 mg total) by mouth Twice daily for 41 doses    budesonide-formoteroL (SYMBICORT) 160-4.5 mcg/actuation Inhalation oral inhaler    Take 2 Puffs by inhalation Twice daily    calcitrioL (ROCALTROL) 0.5 mcg Oral Capsule    Take 2 Capsules (1 mcg total) by mouth Every Monday, Wednesday and Friday    cinacalcet (SENSIPAR) 30 mg Oral Tablet    Take 1 Tablet (30 mg total) by mouth Every evening    cloNIDine (CATAPRES-TTS) 0.3 mg/24 hr Transdermal Patch Weekly    Place 1 Patch (0.3 mg total) on the skin Every 7 days    clopidogreL (PLAVIX) 75 mg Oral Tablet  Take 1 Tablet (75 mg total) by mouth Once a day    cyanocobalamin (VITAMIN B 12) 1,000 mcg Oral Tablet    Take 1 Tablet (1,000 mcg total) by mouth Once a day with lunch    cyclobenzaprine (FLEXERIL) 5 mg Oral Tablet    Take 1 Tablet (5 mg total) by mouth Twice per day as needed for Muscle spasms    dilTIAZem (CARDIZEM CD) 180 mg Oral Capsule, Sust. Release 24 hr    Take 1 Capsule (180 mg total) by mouth Once a day for 30 days    diphenhydrAMINE (BENADRYL) 50 mg Oral Capsule    Take 1 Capsule (50 mg total) by mouth Every 6 hours as needed for Itching    doxazosin (CARDURA) 4 mg Oral Tablet    Take 1 Tablet (4 mg total) by mouth Every evening    epoetin alfa-epbx (RETACRIT) 10,000 unit/mL Injection Solution    Infuse 1 mL (10,000 Units total) into a venous catheter Give in Dialysis for 5 days    folic acid (FOLVITE) 1 mg Oral Tablet    Take 1 Tablet (1 mg total) by mouth Once a day    furosemide  (LASIX) 80 mg Oral Tablet    Take 2 Tablets (160 mg total) by mouth Once a day Take after dialysis on dialysis days; otherwise take in morning daily.    guaiFENesin (MUCINEX) 600 mg Oral Tablet Extended Release 12hr    Take 1 Tablet (600 mg total) by mouth Every 12 hours    HYDROmorphone (DILAUDID) 2 mg/mL Injection Syringe    Infuse 0.2 mL (0.4 mg total) into a venous catheter Every 4 hours as needed    ipratropium-albuterol 0.5 mg-3 mg(2.5 mg base)/3 mL Solution for Nebulization    Take 3 mL by nebulization Four times a day as needed    iron sucrose (VENOFER) 100 mg iron/5 mL Intravenous Solution    Infuse 5 mL (100 mg total) into a venous catheter Give in Dialysis for 5 days    isosorbide mononitrate (IMDUR) 120 mg Oral Tablet Sustained Release 24 hr    Take 1 Tablet (120 mg total) by mouth Every morning for 30 days    labetaloL (NORMODYNE) 300 mg Oral Tablet    Take 1 Tablet (300 mg total) by mouth Twice daily    labetaloL (TRANDATE) 5 mg/mL Intravenous Solution    Infuse 2 mL (10 mg total) into a venous catheter Every one hour as needed for up to 1 day    loratadine (CLARITIN) 10 mg Oral Tablet    Take 1 Tablet (10 mg total) by mouth Once a day    montelukast (SINGULAIR) 10 mg Oral Tablet    Take 1 Tablet (10 mg total) by mouth Every evening    nitroGLYCERIN in 5 % dextrose 50 mg/250 mL (200 mcg/mL) Intravenous Solution    Infuse 5 mcg/min into a venous catheter Cath Lab Continuous    ondansetron (ZOFRAN) 2 mg/mL Injection Solution    Infuse 2 mL (4 mg total) into a venous catheter Every 8 hours as needed    oxyCODONE-acetaminophen (PERCOCET) 5-325 mg Oral Tablet    Take 1 Tablet by mouth Every 6 hours as needed    pantoprazole (PROTONIX) 40 mg Oral Tablet, Delayed Release (E.C.)    Take 1 Tablet (40 mg total) by mouth Once a day          Allergies   Allergen Reactions    Lisinopril Swelling  Tongue and throat swelling    Cardene [Nicardipine]  Other Adverse Reaction (Add comment)     Chest pain      Oxycodone Itching       Past Surgical History:  Past Surgical History:   Procedure Laterality Date    CORONARY ARTERY ANGIOPLASTY      ESOPHAGOGASTRODUODENOSCOPY      HX BACK SURGERY      HX CHOLECYSTECTOMY      HX FOOT SURGERY Right     HX HYSTERECTOMY      HX TONSILLECTOMY             Family History:  Family Medical History:       Problem Relation (Age of Onset)    Breast Cancer Mother    Coronary Artery Disease Mother    Diabetes type II Mother    Hypertension (High Blood Pressure) Father               Social History:  Social History     Tobacco Use    Smoking status: Every Day     Packs/day: 1.00     Years: 10.00     Pack years: 10.00     Types: Cigarettes    Smokeless tobacco: Former    Tobacco comments:     "Haven't had a cigerette in 2 weeks."   Vaping Use    Vaping Use: Never used   Substance Use Topics    Alcohol use: Not Currently    Drug use: Yes     Types: Marijuana        Review of Systems:  General: No fever or chills. No weight changes, fatigue, weakness.   HEENT: No headaches, dizziness, changes in vision, changes in hearing, or difficulty swallowing.    Skin:  No rashes, erythema or bruises.   Cardiac: No chest pain, palpitations, or arrhythmia.    Respiratory:  Short of breath at resting, worsening with exertion  GI: No nausea or vomiting. No abdominal pain.   Urinary: No dysuria, hematuria, or change in frequency.    Vascular: No edema.     Musculoskeletal: No muscle weakness, pain, or decreased range of motion.   Neurologic: No loss of sensation, numbness or tingling.   Endocrine: No heat or cold intolerance or polydipsia.   Psychiatric: No insomnia, depression or anxiety.  Examination:  BP (!) 163/106   Pulse 74   Temp 37.3 C (99.2 F)   Resp 17   Ht 1.702 m (5\' 7" )   Wt 86.2 kg (190 lb)   SpO2 96%   BMI 29.76 kg/m       Filed Vitals:    10/15/21 1900 10/15/21 1930 10/15/21 2000 10/15/21 2030   BP: (!) 186/116 (!) 170/110 (!) 173/106 (!) 163/106   Pulse: 75 72 71 74   Resp: 20 20 13 17     Temp:       SpO2: 93% 96% 94% 96%      General: Patient is alert and oriented to person, place, and time. No acute distress. Communicates appropriately.   Head: Normocephalic and atraumatic.    Eyes: Pupils equally round and react to light and accommodate. Extraocular movements intact.  Conjunctiva normal. Sclerae are normal.    Nose: Nasal passages clear. Mucosa moist.    Throat: Moist oral mucosa. No erythema or exudate of the pharynx. Clear oropharynx.    Neck: Supple. No cervical lymphadenopathy or supraclavicular nodes detected. Trachea midline   Heart: Regular rate and rhythm. S1 &  S2 present. No S3 or S4. No rubs, gallops, or murmurs appreciated.  Radial and dorsalis pedis pulses +2/4 bilaterally.  Brisk capillary refill.    Lungs: Clear to auscultation bilaterally with no wheezes or rales. Equal chest excursion.  No conversational dyspnea. No respiratory distress noted.   Abdomen: Soft, nontender, nondistended belly. Bowel sounds are present in all four quadrants. No rigidity.  No guarding.  No ascites.   Extremities:  Pause edema, no cyanosis, or clubbing. Grossly moves all extremities.    Skin: Warm and dry without lesions. No ecchymosis noted.    Neurologic: Cranial nerves II through XII are grossly intact. Sensation to light touch is intact. Strength 5/5 in upper extremities and lower extremities bilaterally.    Genitourinary:  No urinary incontinence or Foley catheter   Psychiatric: Judgment and insight are intact. Mood and affect are appropriate for the situation.       Labs:    Results for orders placed or performed during the hospital encounter of 10/15/21 (from the past 24 hour(s))   ECG 12 LEAD   Result Value Ref Range    Ventricular rate 74 BPM    Atrial Rate 74 BPM    PR Interval 228 ms    QRS Duration 106 ms    QT Interval 420 ms    QTC Calculation 466 ms    Calculated P Axis 79 degrees    Calculated R Axis 51 degrees    Calculated T Axis 77 degrees   URINALYSIS, MACROSCOPIC   Result Value Ref  Range    COLOR Light Yellow Colorless, Light Yellow, Yellow    APPEARANCE Turbid (A) Clear    SPECIFIC GRAVITY 1.009 1.002 - 1.030    PH 7.5 5.0 - 9.0    LEUKOCYTES 500 (A) Negative, 100  WBCs/uL    NITRITE Negative Negative    PROTEIN 50 (A) Negative, 10 , 20  mg/dL    GLUCOSE Negative Negative, 30  mg/dL    KETONES Negative Negative, Trace mg/dL    BILIRUBIN Negative Negative, 0.5 mg/dL    BLOOD 0.03 Negative, 0.03 mg/dL    UROBILINOGEN Normal Normal mg/dL   URINALYSIS, MICROSCOPIC   Result Value Ref Range    BACTERIA Negative Negative /hpf    RBCS 17 (H) <4 /hpf    WBCS 30 (H) <6 /hpf    SQUAMOUS EPITHELIAL 4 <28 /hpf   COVID-19, FLU A/B, RSV RAPID BY PCR   Result Value Ref Range    SARS-CoV-2 Not Detected Not Detected    INFLUENZA VIRUS TYPE A Not Detected Not Detected    INFLUENZA VIRUS TYPE B Not Detected Not Detected    RESPIRATORY SYNCTIAL VIRUS (RSV) Not Detected Not Detected   COMPREHENSIVE METABOLIC PANEL, NON-FASTING   Result Value Ref Range    SODIUM 131 (L) 136 - 145 mmol/L    POTASSIUM 5.4 (H) 3.5 - 5.1 mmol/L    CHLORIDE 95 (L) 98 - 107 mmol/L    CO2 TOTAL 22 21 - 31 mmol/L    ANION GAP 14 (H) 4 - 13 mmol/L    BUN 63 (H) 7 - 25 mg/dL    CREATININE 15.05 (H) 0.60 - 1.30 mg/dL    BUN/CREA RATIO 4 (L) 6 - 22    ESTIMATED GFR 3 (L) >59 mL/min/1.47m^2    ALBUMIN 4.2 3.5 - 5.7 g/dL    CALCIUM 10.6 (H) 8.6 - 10.3 mg/dL    GLUCOSE 67 (L) 74 - 109 mg/dL    ALKALINE  PHOSPHATASE 70 34 - 104 U/L    ALT (SGPT) 7 7 - 52 U/L    AST (SGOT) 12 (L) 13 - 39 U/L    BILIRUBIN TOTAL 0.7 0.3 - 1.2 mg/dL    PROTEIN TOTAL 7.9 6.4 - 8.9 g/dL    ALBUMIN/GLOBULIN RATIO 1.1 0.8 - 1.4    OSMOLALITY, CALCULATED 279 270 - 290 mOsm/kg    CALCIUM, CORRECTED 10.4 8.9 - 10.8 mg/dL    GLOBULIN 3.7 2.9 - 5.4   LACTIC ACID LEVEL W/ REFLEX FOR LEVEL >2.0   Result Value Ref Range    LACTIC ACID 0.4 (L) 0.5 - 2.2 mmol/L   PTT (PARTIAL THROMBOPLASTIN TIME)   Result Value Ref Range    APTT 52.5 (H) 26.0 - 36.0 seconds   PT/INR   Result Value  Ref Range    PROTHROMBIN TIME 15.6 (H) 9.8 - 12.7 seconds    INR 1.35 <=5.00   TROPONIN-I   Result Value Ref Range    TROPONIN I 71 (H) <15 ng/L   CBC WITH DIFF   Result Value Ref Range    WBCS UNCORRECTED 10.1 x10^3/uL    WBC 10.1 4.0 - 10.5 x10^3/uL    RBC 2.75 (L) 4.20 - 5.40 x10^6/uL    HGB 7.8 (L) 12.5 - 16.0 g/dL    HCT 23.6 (L) 37.0 - 47.0 %    MCV 85.9 78.0 - 99.0 fL    MCH 28.3 27.0 - 32.0 pg    MCHC 32.9 32.0 - 36.0 g/dL    RDW 20.7 (H) 11.6 - 14.8 %    PLATELETS 315 140 - 440 x10^3/uL    MPV 6.6 (L) 7.4 - 10.4 fL    NEUTROPHIL % 81 (H) 40 - 76 %    LYMPHOCYTE % 9 (L) 25 - 45 %    MONOCYTE % 5 0 - 12 %    EOSINOPHIL % 4 0 - 7 %    BASOPHIL % 1 0 - 3 %    NEUTROPHIL # 8.20 1.80 - 8.40 x10^3/uL    LYMPHOCYTE # 0.90 (L) 1.10 - 5.00 x10^3/uL    MONOCYTE # 0.50 0.00 - 1.30 x10^3/uL    EOSINOPHIL # 0.40 0.00 - 0.80 x10^3/uL    BASOPHIL # 0.10 0.00 - 0.30 x10^3/uL    RBC COMMENT       SLIGHT ANISO, SLIGHT POIK, RARE SCHISTOCYTES, RARE ELLIPTOCYTES    PLATELET COMMENT PLATELET COUNT APPEARS NORMAL ON SMEAR    TROPONIN-I   Result Value Ref Range    TROPONIN I 69 (H) <15 ng/L       Imaging Studies:   ECG 12 LEAD  Sinus rhythm with 1st degree AV block  Minimal voltage criteria for LVH, may be normal variant ( [employer] product )  Borderline ECG  Confirmed by Anderson Malta ([id number]) on 10/15/2021 10:03:52 PM       Assessment/Plan:   Active Hospital Problems    Diagnosis    Hypertensive urgency    Acute respiratory failure with hypoxia (CMS HCC)    Anemia in chronic kidney disease    Congestive heart failure (CMS HCC)    GERD (gastroesophageal reflux disease)    COPD (chronic obstructive pulmonary disease) (CMS HCC)    End-stage renal disease on hemodialysis (CMS HCC)     Patient be placed on telemetry.  Consult made to Nephrology for further dialysis orders for ESRD.  Patient's home medications will need to be restarted once they are loaded in the  computer.  Patient is on Eliquis by mouth which will need to be restarted for  DVT prophylaxis.  One troponin has been ordered.  Repeat labs ordered for the a.m.Marland Kitchen  Patient be followed by hospitalist     DVT/PE Prophylaxis:  Eliquis    Marlaine Hind, FNP-BC  This note was partially generated using M[employer] Fluency Direct system, and there may be some incorrect words, spellings, and punctuation that were not noted in checking the note before saving.

## 2021-10-15 NOTE — Care Plan (Signed)
Problem: Adult Inpatient Plan of Care  Goal: Patient-Specific Goal (Individualized)  Recent Flowsheet Documentation  Taken 10/15/2021 2300 by Georgette Dover, RN  Individualized Care Needs: Monitor BP  Anxieties, Fears or Concerns: Chest pain  Patient-Specific Goals (Include Timeframe): Remove excess fluid and lower BP  Goal: Absence of Hospital-Acquired Illness or Injury  Intervention: Prevent Skin Injury  Recent Flowsheet Documentation  Taken 10/15/2021 2334 by Georgette Dover, RN  Skin Protection: adhesive use limited  Goal: Optimal Comfort and Wellbeing  Intervention: Monitor Pain and Promote Comfort  Recent Flowsheet Documentation  Taken 10/15/2021 2334 by Georgette Dover, RN  Pain Management Interventions: position adjusted     Problem: Fluid Volume Excess  Goal: Fluid Balance  Intervention: Monitor and Manage Hypervolemia  Flowsheets (Taken 10/15/2021 2334)  Skin Protection: adhesive use limited     Problem: Pain Acute  Goal: Optimal Pain Control and Function  Intervention: Develop Pain Management Plan  Flowsheets (Taken 10/15/2021 2334)  Pain Management Interventions: position adjusted  Intervention: Optimize Psychosocial Wellbeing  Flowsheets (Taken 10/15/2021 2334)  Diversional Activities:   smartphone   television     Problem: Gas Exchange Impaired  Goal: Optimal Gas Exchange  Intervention: Optimize Oxygenation and Ventilation  Flowsheets (Taken 10/15/2021 2334)  Airway/Ventilation Management: calming measures promoted  Head of Bed (HOB) Positioning: 30 degrees   Titrated to 4 L NC. Provider notified of BP. Home meds to be restarted.

## 2021-10-15 NOTE — Nurses Notes (Signed)
Patient arrived to unit. Vital signs obtained. Oriented to unit and educated on call light use.   O2 titrated to 4 LNC. BP elevated provider notified. Awaiting orders.

## 2021-10-16 DIAGNOSIS — Z7951 Long term (current) use of inhaled steroids: Secondary | ICD-10-CM

## 2021-10-16 DIAGNOSIS — Z955 Presence of coronary angioplasty implant and graft: Secondary | ICD-10-CM | POA: Diagnosis present

## 2021-10-16 DIAGNOSIS — J441 Chronic obstructive pulmonary disease with (acute) exacerbation: Secondary | ICD-10-CM | POA: Diagnosis present

## 2021-10-16 DIAGNOSIS — Z91158 Patient's noncompliance with renal dialysis for other reason: Secondary | ICD-10-CM

## 2021-10-16 DIAGNOSIS — Z7982 Long term (current) use of aspirin: Secondary | ICD-10-CM

## 2021-10-16 DIAGNOSIS — Z79899 Other long term (current) drug therapy: Secondary | ICD-10-CM

## 2021-10-16 DIAGNOSIS — I2510 Atherosclerotic heart disease of native coronary artery without angina pectoris: Secondary | ICD-10-CM

## 2021-10-16 LAB — COMPREHENSIVE METABOLIC PANEL, NON-FASTING
ALBUMIN/GLOBULIN RATIO: 1.1 (ref 0.8–1.4)
ALBUMIN: 4 g/dL (ref 3.5–5.7)
ALKALINE PHOSPHATASE: 66 U/L (ref 34–104)
ALT (SGPT): <7 U/L — ABNORMAL LOW (ref 7–52)
ANION GAP: 15 mmol/L — ABNORMAL HIGH (ref 4–13)
AST (SGOT): 11 U/L — ABNORMAL LOW (ref 13–39)
BILIRUBIN TOTAL: 0.7 mg/dL (ref 0.3–1.2)
BUN/CREA RATIO: 4 — ABNORMAL LOW (ref 6–22)
BUN: 64 mg/dL — ABNORMAL HIGH (ref 7–25)
CALCIUM, CORRECTED: 10.4 mg/dL (ref 8.9–10.8)
CALCIUM: 10.4 mg/dL — ABNORMAL HIGH (ref 8.6–10.3)
CHLORIDE: 95 mmol/L — ABNORMAL LOW (ref 98–107)
CO2 TOTAL: 21 mmol/L (ref 21–31)
CREATININE: 15.07 mg/dL — ABNORMAL HIGH (ref 0.60–1.30)
ESTIMATED GFR: 3 mL/min/{1.73_m2} — ABNORMAL LOW (ref >59)
GLOBULIN: 3.5 (ref 2.9–5.4)
GLUCOSE: 75 mg/dL (ref 74–109)
OSMOLALITY, CALCULATED: 280 mOsm/kg (ref 270–290)
POTASSIUM: 5.2 mmol/L — ABNORMAL HIGH (ref 3.5–5.1)
PROTEIN TOTAL: 7.5 g/dL (ref 6.4–8.9)
SODIUM: 131 mmol/L — ABNORMAL LOW (ref 136–145)

## 2021-10-16 LAB — FERRITIN: FERRITIN: 797 ng/mL — ABNORMAL HIGH (ref 11–336)

## 2021-10-16 LAB — ECG 12 LEAD
Atrial Rate: 89 {beats}/min
Calculated P Axis: 90 degrees
Calculated R Axis: 23 degrees
Calculated T Axis: 103 degrees
PR Interval: 182 ms
QRS Duration: 100 ms
QT Interval: 384 ms
QTC Calculation: 472 ms
Ventricular rate: 91 {beats}/min

## 2021-10-16 LAB — CBC
HCT: 22.7 % — ABNORMAL LOW (ref 37.0–47.0)
HGB: 7.7 g/dL — ABNORMAL LOW (ref 12.5–16.0)
MCH: 28.9 pg (ref 27.0–32.0)
MCHC: 33.8 g/dL (ref 32.0–36.0)
MCV: 85.4 fL (ref 78.0–99.0)
MPV: 6.5 fL — ABNORMAL LOW (ref 7.4–10.4)
PLATELETS: 277 10*3/uL (ref 140–440)
RBC: 2.65 10*6/uL — ABNORMAL LOW (ref 4.20–5.40)
RDW: 20.7 % — ABNORMAL HIGH (ref 11.6–14.8)
WBC: 9.5 10*3/uL (ref 4.0–10.5)
WBCS UNCORRECTED: 9.5 10*3/uL

## 2021-10-16 LAB — IRON TRANSFERRIN AND TIBC
IRON (TRANSFERRIN) SATURATION: 9 % — ABNORMAL LOW (ref 15–50)
IRON: 30 ug/dL — ABNORMAL LOW (ref 50–212)
TOTAL IRON BINDING CAPACITY: 349 ug/dL (ref 250–450)
TRANSFERRIN: 249 mg/dL (ref 203–362)
UIBC: 319 ug/dL (ref 130–375)

## 2021-10-16 LAB — TROPONIN-I: TROPONIN I: 73 ng/L — ABNORMAL HIGH (ref <15)

## 2021-10-16 LAB — C-REACTIVE PROTEIN (CRP): C-REACTIVE PROTEIN (CRP): 4.9 mg/dL — ABNORMAL HIGH (ref 0.1–0.5)

## 2021-10-16 LAB — LIGHT GREEN TOP TUBE

## 2021-10-16 LAB — MAGNESIUM: MAGNESIUM: 2.2 mg/dL (ref 1.9–2.7)

## 2021-10-16 MED ORDER — CLONIDINE 0.1 MG/24 HR WEEKLY TRANSDERMAL PATCH
0.3000 mg | MEDICATED_PATCH | TRANSDERMAL | Status: DC
Start: 2021-10-16 — End: 2021-10-20
  Administered 2021-10-16: 0 mg via TRANSDERMAL

## 2021-10-16 MED ORDER — CLONIDINE HCL 0.2 MG TABLET
0.2000 mg | ORAL_TABLET | Freq: Two times a day (BID) | ORAL | Status: DC
Start: 2021-10-16 — End: 2021-10-21
  Administered 2021-10-16 – 2021-10-18 (×6): 0.2 mg via ORAL
  Administered 2021-10-19: 0 mg via ORAL
  Administered 2021-10-19: 0.2 mg via ORAL
  Administered 2021-10-20: 0 mg via ORAL
  Administered 2021-10-20 – 2021-10-21 (×2): 0.2 mg via ORAL
  Filled 2021-10-16 (×10): qty 1

## 2021-10-16 MED ORDER — LABETALOL 200 MG TABLET
300.0000 mg | ORAL_TABLET | Freq: Two times a day (BID) | ORAL | Status: DC
Start: 2021-10-16 — End: 2021-10-21
  Administered 2021-10-16 – 2021-10-18 (×7): 300 mg via ORAL
  Administered 2021-10-19: 0 mg via ORAL
  Administered 2021-10-19: 300 mg via ORAL
  Administered 2021-10-20: 0 mg via ORAL
  Administered 2021-10-20: 300 mg via ORAL
  Administered 2021-10-21: 0 mg via ORAL
  Filled 2021-10-16 (×10): qty 2

## 2021-10-16 MED ORDER — MORPHINE 2 MG/ML INJECTION WRAPPER
2.0000 mg | INJECTION | Freq: Three times a day (TID) | INTRAMUSCULAR | Status: DC | PRN
Start: 2021-10-16 — End: 2021-10-21
  Administered 2021-10-16: 2 mg via INTRAVENOUS
  Filled 2021-10-16: qty 1

## 2021-10-16 MED ORDER — ONDANSETRON HCL (PF) 4 MG/2 ML INJECTION SOLUTION
4.0000 mg | Freq: Three times a day (TID) | INTRAMUSCULAR | Status: DC | PRN
Start: 2021-10-16 — End: 2021-10-21
  Administered 2021-10-16 – 2021-10-20 (×5): 4 mg via INTRAVENOUS
  Filled 2021-10-16 (×5): qty 2

## 2021-10-16 MED ORDER — ATORVASTATIN 10 MG TABLET
80.0000 mg | ORAL_TABLET | Freq: Every evening | ORAL | Status: DC
Start: 2021-10-16 — End: 2021-10-17
  Administered 2021-10-16: 80 mg via ORAL
  Administered 2021-10-16: 0 mg via ORAL
  Filled 2021-10-16 (×2): qty 8

## 2021-10-16 MED ORDER — HYDRALAZINE 20 MG/ML INJECTION SOLUTION
10.0000 mg | Freq: Four times a day (QID) | INTRAMUSCULAR | Status: DC | PRN
Start: 2021-10-16 — End: 2021-10-21
  Administered 2021-10-16: 10 mg via INTRAVENOUS
  Filled 2021-10-16: qty 1

## 2021-10-16 MED ORDER — DIPHENHYDRAMINE 50 MG CAPSULE
50.0000 mg | ORAL_CAPSULE | Freq: Four times a day (QID) | ORAL | Status: DC | PRN
Start: 2021-10-16 — End: 2021-10-21
  Administered 2021-10-16: 50 mg via ORAL
  Filled 2021-10-16: qty 1

## 2021-10-16 MED ORDER — CLONIDINE HCL 0.2 MG TABLET
0.2000 mg | ORAL_TABLET | Freq: Two times a day (BID) | ORAL | Status: DC
Start: 2021-10-16 — End: 2021-10-16
  Administered 2021-10-16: 0.2 mg via ORAL
  Filled 2021-10-16 (×2): qty 1

## 2021-10-16 MED ORDER — PANTOPRAZOLE 40 MG TABLET,DELAYED RELEASE
40.0000 mg | DELAYED_RELEASE_TABLET | Freq: Every day | ORAL | Status: DC
Start: 2021-10-16 — End: 2021-10-21
  Administered 2021-10-16 – 2021-10-21 (×6): 40 mg via ORAL
  Filled 2021-10-16 (×6): qty 1

## 2021-10-16 MED ORDER — FLUCONAZOLE 200 MG TABLET
200.0000 mg | ORAL_TABLET | Freq: Every day | ORAL | Status: AC
Start: 2021-10-16 — End: 2021-10-19
  Administered 2021-10-16 – 2021-10-19 (×3): 200 mg via ORAL
  Filled 2021-10-16 (×3): qty 1

## 2021-10-16 MED ORDER — MIDODRINE 2.5 MG TABLET
5.0000 mg | ORAL_TABLET | Freq: Every day | ORAL | Status: DC | PRN
Start: 2021-10-16 — End: 2021-10-21
  Filled 2021-10-16 (×3): qty 2

## 2021-10-16 MED ORDER — MONTELUKAST 10 MG TABLET
10.0000 mg | ORAL_TABLET | Freq: Every evening | ORAL | Status: DC
Start: 2021-10-16 — End: 2021-10-21
  Administered 2021-10-16 – 2021-10-20 (×6): 10 mg via ORAL
  Filled 2021-10-16 (×6): qty 1

## 2021-10-16 MED ORDER — ASPIRIN 81 MG CHEWABLE TABLET
81.0000 mg | CHEWABLE_TABLET | Freq: Every day | ORAL | Status: DC
Start: 2021-10-16 — End: 2021-10-21
  Administered 2021-10-16 – 2021-10-21 (×6): 81 mg via ORAL
  Filled 2021-10-16 (×6): qty 1

## 2021-10-16 MED ORDER — DOXAZOSIN 4 MG TABLET
4.0000 mg | ORAL_TABLET | Freq: Every evening | ORAL | Status: DC
Start: 2021-10-16 — End: 2021-10-21
  Administered 2021-10-16 – 2021-10-20 (×6): 4 mg via ORAL
  Filled 2021-10-16 (×6): qty 1

## 2021-10-16 MED ORDER — CYCLOBENZAPRINE 10 MG TABLET
5.0000 mg | ORAL_TABLET | Freq: Two times a day (BID) | ORAL | Status: DC | PRN
Start: 2021-10-16 — End: 2021-10-21
  Administered 2021-10-16 – 2021-10-18 (×2): 5 mg via ORAL
  Filled 2021-10-16 (×2): qty 1

## 2021-10-16 MED ORDER — LOSARTAN 50 MG TABLET
100.0000 mg | ORAL_TABLET | Freq: Every evening | ORAL | Status: DC
Start: 2021-10-16 — End: 2021-10-21
  Administered 2021-10-16 – 2021-10-20 (×5): 100 mg via ORAL
  Filled 2021-10-16 (×5): qty 2

## 2021-10-16 MED ORDER — FAMOTIDINE 40 MG TABLET
40.0000 mg | ORAL_TABLET | Freq: Every day | ORAL | Status: DC
Start: 2021-10-16 — End: 2021-10-21
  Administered 2021-10-16 – 2021-10-21 (×6): 40 mg via ORAL
  Filled 2021-10-16 (×6): qty 1

## 2021-10-16 MED ORDER — OXYCODONE-ACETAMINOPHEN 5 MG-325 MG TABLET
1.0000 | ORAL_TABLET | Freq: Four times a day (QID) | ORAL | Status: DC | PRN
Start: 2021-10-16 — End: 2021-10-16
  Filled 2021-10-16: qty 1

## 2021-10-16 MED ORDER — HYDRALAZINE 50 MG TABLET
100.0000 mg | ORAL_TABLET | Freq: Two times a day (BID) | ORAL | Status: DC
Start: 2021-10-16 — End: 2021-10-19
  Administered 2021-10-16 – 2021-10-18 (×7): 100 mg via ORAL
  Administered 2021-10-19: 0 mg via ORAL
  Filled 2021-10-16 (×8): qty 2

## 2021-10-16 MED ORDER — GUAIFENESIN ER 600 MG TABLET, EXTENDED RELEASE 12 HR
600.0000 mg | EXTENDED_RELEASE_TABLET | Freq: Two times a day (BID) | ORAL | Status: DC
Start: 2021-10-16 — End: 2021-10-21
  Administered 2021-10-16 – 2021-10-21 (×12): 600 mg via ORAL
  Filled 2021-10-16 (×12): qty 1

## 2021-10-16 MED ORDER — FUROSEMIDE 40 MG TABLET
160.0000 mg | ORAL_TABLET | Freq: Every day | ORAL | Status: DC
Start: 2021-10-16 — End: 2021-10-21
  Administered 2021-10-16 – 2021-10-21 (×6): 160 mg via ORAL
  Filled 2021-10-16 (×6): qty 4

## 2021-10-16 MED ORDER — CINACALCET 30 MG TABLET
30.0000 mg | ORAL_TABLET | Freq: Every evening | ORAL | Status: DC
Start: 2021-10-16 — End: 2021-10-21
  Administered 2021-10-16 – 2021-10-20 (×6): 30 mg via ORAL
  Filled 2021-10-16 (×6): qty 1

## 2021-10-16 MED ORDER — ALBUTEROL SULFATE 2.5 MG/3 ML (0.083 %) SOLUTION FOR NEBULIZATION
2.5000 mg | INHALATION_SOLUTION | Freq: Four times a day (QID) | RESPIRATORY_TRACT | Status: DC
Start: 2021-10-16 — End: 2021-10-20
  Administered 2021-10-16 (×2): 2.5 mg via RESPIRATORY_TRACT
  Administered 2021-10-16: 0 mg via RESPIRATORY_TRACT
  Administered 2021-10-16 – 2021-10-17 (×4): 2.5 mg via RESPIRATORY_TRACT
  Administered 2021-10-17: 0 mg via RESPIRATORY_TRACT
  Administered 2021-10-18 (×3): 2.5 mg via RESPIRATORY_TRACT
  Administered 2021-10-18: 0 mg via RESPIRATORY_TRACT
  Administered 2021-10-19 (×2): 2.5 mg via RESPIRATORY_TRACT
  Administered 2021-10-19 – 2021-10-20 (×3): 0 mg via RESPIRATORY_TRACT
  Filled 2021-10-16 (×17): qty 3

## 2021-10-16 MED ORDER — HYDROMORPHONE 2 MG/ML INJECTION WRAPPER
1.0000 mg | INJECTION | INTRAMUSCULAR | Status: DC | PRN
Start: 2021-10-16 — End: 2021-10-21
  Administered 2021-10-16 – 2021-10-20 (×8): 1 mg via INTRAVENOUS
  Filled 2021-10-16 (×9): qty 1

## 2021-10-16 MED ORDER — AMLODIPINE 10 MG TABLET
10.0000 mg | ORAL_TABLET | Freq: Every day | ORAL | Status: DC
Start: 2021-10-16 — End: 2021-10-21
  Administered 2021-10-16 – 2021-10-21 (×6): 10 mg via ORAL
  Filled 2021-10-16 (×6): qty 1

## 2021-10-16 MED ORDER — ACETAMINOPHEN 325 MG TABLET
650.0000 mg | ORAL_TABLET | ORAL | Status: DC | PRN
Start: 2021-10-16 — End: 2021-10-21

## 2021-10-16 MED ORDER — ISOSORBIDE MONONITRATE ER 60 MG TABLET,EXTENDED RELEASE 24 HR
120.0000 mg | ORAL_TABLET | Freq: Every morning | ORAL | Status: DC
Start: 2021-10-16 — End: 2021-10-21
  Administered 2021-10-16 – 2021-10-19 (×4): 120 mg via ORAL
  Administered 2021-10-20 – 2021-10-21 (×2): 0 mg via ORAL
  Filled 2021-10-16 (×5): qty 2

## 2021-10-16 MED ORDER — CYANOCOBALAMIN (VIT B-12) 250 MCG TABLET
1000.0000 ug | ORAL_TABLET | Freq: Every day | ORAL | Status: DC
Start: 2021-10-16 — End: 2021-10-21
  Administered 2021-10-16 – 2021-10-21 (×6): 1000 ug via ORAL
  Filled 2021-10-16 (×6): qty 4

## 2021-10-16 MED ORDER — EPOETIN ALFA-EPBX 10,000 UNIT/ML INJECTION SOLUTION
10000.0000 [IU] | INTRAMUSCULAR | Status: DC
Start: 2021-10-18 — End: 2021-10-21
  Administered 2021-10-18 – 2021-10-21 (×2): 10000 [IU] via INTRAVENOUS
  Filled 2021-10-16 (×2): qty 1

## 2021-10-16 MED ORDER — CLOPIDOGREL 75 MG TABLET
75.0000 mg | ORAL_TABLET | Freq: Every day | ORAL | Status: DC
Start: 2021-10-16 — End: 2021-10-21
  Administered 2021-10-16 – 2021-10-21 (×6): 75 mg via ORAL
  Filled 2021-10-16 (×6): qty 1

## 2021-10-16 MED ORDER — KETOROLAC 30 MG/ML (1 ML) INJECTION SOLUTION
15.0000 mg | INTRAMUSCULAR | Status: AC
Start: 2021-10-16 — End: 2021-10-16
  Administered 2021-10-16: 15 mg via INTRAVENOUS
  Filled 2021-10-16: qty 1

## 2021-10-16 MED ORDER — CALCITRIOL 0.25 MCG CAPSULE
1.0000 ug | ORAL_CAPSULE | ORAL | Status: DC
Start: 2021-10-16 — End: 2021-10-21
  Administered 2021-10-16 – 2021-10-21 (×3): 1 ug via ORAL
  Filled 2021-10-16 (×3): qty 4

## 2021-10-16 MED ORDER — SPIRONOLACTONE 25 MG TABLET
50.0000 mg | ORAL_TABLET | Freq: Two times a day (BID) | ORAL | Status: DC
Start: 2021-10-16 — End: 2021-10-21
  Administered 2021-10-16 – 2021-10-18 (×6): 50 mg via ORAL
  Administered 2021-10-19 – 2021-10-21 (×5): 0 mg via ORAL
  Filled 2021-10-16 (×6): qty 2

## 2021-10-16 MED ORDER — ETHYL ALCOHOL 62 % (NOZIN NASAL SANITIZER) NASAL SOLUTION - BULK BOTTLE
1.0000 | Freq: Two times a day (BID) | NASAL | Status: DC
Start: 2021-10-16 — End: 2021-10-21
  Administered 2021-10-16 – 2021-10-18 (×5): 1 via NASAL
  Administered 2021-10-18: 0 via NASAL
  Administered 2021-10-19: 1 via NASAL
  Administered 2021-10-19: 0 via NASAL
  Administered 2021-10-20: 1 via NASAL
  Administered 2021-10-20: 0 via NASAL
  Administered 2021-10-21: 1 via NASAL

## 2021-10-16 MED ORDER — NITROGLYCERIN 0.4 MG SUBLINGUAL TABLET
0.4000 mg | SUBLINGUAL_TABLET | SUBLINGUAL | Status: DC | PRN
Start: 2021-10-16 — End: 2021-10-21
  Administered 2021-10-16 (×2): 0.4 mg via SUBLINGUAL
  Administered 2021-10-16: 0 mg via SUBLINGUAL
  Filled 2021-10-16 (×2): qty 1

## 2021-10-16 MED ORDER — FOLIC ACID 1 MG TABLET
1.0000 mg | ORAL_TABLET | Freq: Every day | ORAL | Status: DC
Start: 2021-10-16 — End: 2021-10-21
  Administered 2021-10-16 – 2021-10-21 (×6): 1 mg via ORAL
  Filled 2021-10-16 (×6): qty 1

## 2021-10-16 MED ORDER — BUDESONIDE 0.5 MG/2 ML SUSPENSION FOR NEBULIZATION
0.5000 mg | INHALATION_SUSPENSION | Freq: Two times a day (BID) | RESPIRATORY_TRACT | Status: DC
Start: 2021-10-16 — End: 2021-10-20
  Administered 2021-10-16: 0.5 mg via RESPIRATORY_TRACT
  Administered 2021-10-16: 0 mg via RESPIRATORY_TRACT
  Administered 2021-10-16 – 2021-10-17 (×3): 0.5 mg via RESPIRATORY_TRACT
  Administered 2021-10-18: 0 mg via RESPIRATORY_TRACT
  Administered 2021-10-18 – 2021-10-19 (×2): 0.5 mg via RESPIRATORY_TRACT
  Administered 2021-10-19 – 2021-10-20 (×2): 0 mg via RESPIRATORY_TRACT
  Filled 2021-10-16 (×11): qty 2

## 2021-10-16 MED ORDER — LORATADINE 10 MG TABLET
10.0000 mg | ORAL_TABLET | Freq: Every day | ORAL | Status: DC
Start: 2021-10-16 — End: 2021-10-21
  Administered 2021-10-16 – 2021-10-21 (×6): 10 mg via ORAL
  Filled 2021-10-16 (×6): qty 1

## 2021-10-16 MED ORDER — IPRATROPIUM 0.5 MG-ALBUTEROL 3 MG (2.5 MG BASE)/3 ML NEBULIZATION SOLN
3.0000 mL | INHALATION_SOLUTION | Freq: Four times a day (QID) | RESPIRATORY_TRACT | Status: DC | PRN
Start: 2021-10-16 — End: 2021-10-21
  Administered 2021-10-17: 3 mL via RESPIRATORY_TRACT
  Filled 2021-10-16: qty 3

## 2021-10-16 MED ORDER — APIXABAN 5 MG TABLET
2.5000 mg | ORAL_TABLET | Freq: Two times a day (BID) | ORAL | Status: DC
Start: 2021-10-16 — End: 2021-10-21
  Administered 2021-10-16 – 2021-10-18 (×6): 2.5 mg via ORAL
  Administered 2021-10-18: 0 mg via ORAL
  Administered 2021-10-19 – 2021-10-21 (×5): 2.5 mg via ORAL
  Filled 2021-10-16 (×12): qty 1

## 2021-10-16 MED ORDER — EPOETIN ALFA-EPBX 10,000 UNIT/ML INJECTION SOLUTION
10000.0000 [IU] | Freq: Once | INTRAMUSCULAR | Status: AC
Start: 2021-10-16 — End: 2021-10-16
  Administered 2021-10-16: 10000 [IU] via INTRAVENOUS
  Filled 2021-10-16: qty 1

## 2021-10-16 MED ORDER — METHYLPREDNISOLONE SOD SUCC 125 MG SOLUTION FOR INJECTION WRAPPER
62.5000 mg | Freq: Four times a day (QID) | INTRAVENOUS | Status: DC
Start: 2021-10-16 — End: 2021-10-21
  Administered 2021-10-16 – 2021-10-18 (×9): 62.5 mg via INTRAVENOUS
  Administered 2021-10-18: 0 mg via INTRAVENOUS
  Administered 2021-10-19 – 2021-10-21 (×10): 62.5 mg via INTRAVENOUS
  Filled 2021-10-16 (×20): qty 2

## 2021-10-16 NOTE — Care Plan (Signed)
Pt stable, scheduled for dialysis, will reassess.  Problem: Adult Inpatient Plan of Care  Goal: Plan of Care Review  Outcome: Ongoing (see interventions/notes)  Goal: Patient-Specific Goal (Individualized)  Outcome: Ongoing (see interventions/notes)  Flowsheets (Taken 10/16/2021 0737)  Individualized Care Needs: O2 mgnt, breathing treatments  Anxieties, Fears or Concerns: Chest pain  Patient-Specific Goals (Include Timeframe): Remove excess fluid and lower BP  Goal: Absence of Hospital-Acquired Illness or Injury  Outcome: Ongoing (see interventions/notes)  Intervention: Prevent Skin Injury  Recent Flowsheet Documentation  Taken 10/16/2021 0737 by Camille Bal, RN  Skin Protection: adhesive use limited  Goal: Optimal Comfort and Wellbeing  Outcome: Ongoing (see interventions/notes)  Goal: Rounds/Family Conference  Outcome: Ongoing (see interventions/notes)     Problem: Fluid Volume Excess  Goal: Fluid Balance  Outcome: Ongoing (see interventions/notes)  Intervention: Monitor and Manage Hypervolemia  Recent Flowsheet Documentation  Taken 10/16/2021 0737 by Camille Bal, RN  Skin Protection: adhesive use limited     Problem: Pain Acute  Goal: Optimal Pain Control and Function  Outcome: Ongoing (see interventions/notes)  Intervention: Optimize Psychosocial Wellbeing  Recent Flowsheet Documentation  Taken 10/16/2021 0737 by Camille Bal, RN  Diversional Activities:   smartphone   television     Problem: Gas Exchange Impaired  Goal: Optimal Gas Exchange  Outcome: Ongoing (see interventions/notes)  Intervention: Optimize Oxygenation and Ventilation  Recent Flowsheet Documentation  Taken 10/16/2021 0737 by Camille Bal, RN  Head of Bed [facility]) Positioning: 30 degrees     Problem: Skin Injury Risk Increased  Goal: Skin Health and Integrity  Outcome: Ongoing (see interventions/notes)  Intervention: Optimize Skin Protection  Recent Flowsheet Documentation  Taken 10/16/2021 0737 by Camille Bal, RN  Skin Protection: adhesive use  limited  Head of Bed (HOB) Positioning: 30 degrees

## 2021-10-16 NOTE — Consults (Signed)
[place]    NEPHROLOGY CONSULTATION NOTE       [name] 46 y.o. female 318/A   Date of Service: 10/16/2021    Date of Admission:  10/15/2021   PCP: Cam Hai, FNP Code Status:Full Code       Reason for Consultation:  End-stage renal disease on hemodialysis MWF     HPI:   46 year old female with past medical history of end-stage renal disease on hemodialysis Mondays Wednesdays and Fridays missed dialysis on Monday due to issues with her oxygen delivery was delayed on Monday.    Patient reports today that she is short of breath she denies nausea or vomiting but reports decrease in appetite she also reports that her body all is hurting, she reported some bleeding from her nose.      ROS:   Systematic review of 12 organ systems was negative except what mentioned in in the HPI.    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    Tricuspid valve regurgitation     Uncontrolled hypertension         PSHx:   Past Surgical History:   Procedure Laterality Date    CORONARY ARTERY ANGIOPLASTY      ESOPHAGOGASTRODUODENOSCOPY      HX BACK SURGERY      HX CHOLECYSTECTOMY      HX FOOT SURGERY Right     HX HYSTERECTOMY      HX TONSILLECTOMY            Allergies:    Allergies   Allergen Reactions    Lisinopril Swelling     Tongue and throat swelling    Cardene [Nicardipine]  Other Adverse Reaction (Add comment)     Chest pain      Oxycodone Itching    Social History  Social History     Tobacco Use    Smoking status: Every Day     Packs/day: 1.00     Years: 10.00     Pack years: 10.00     Types: Cigarettes    Smokeless tobacco: Former    Tobacco comments:     "Haven't had a cigerette in 2 weeks."   Vaping Use    Vaping Use: Never used   Substance Use Topics    Alcohol use: Not Currently    Drug use: Yes     Types: Marijuana       Family History  Family Medical History:       Problem Relation (Age of Onset)    Breast Cancer Mother    Coronary Artery Disease Mother    Diabetes type II Mother    Hypertension (High Blood Pressure) Father  Home Meds:      Prior to Admission medications    Medication Sig Start Date End Date Taking? Authorizing Provider   acetaminophen (TYLENOL) 325 mg Oral Tablet Take 2 Tablets (650 mg total) by mouth Every 4 hours as needed 08/19/21   Scudiere, Loa Socks, APRN   amLODIPine (NORVASC) 10 mg Oral Tablet Take 1 Tablet (10 mg total) by mouth Once a day Take one tablet every day   Yes Provider, Historical   apixaban (ELIQUIS) 2.5 mg Oral Tablet Take 1 Tablet (2.5 mg total) by mouth Twice daily for 41 doses 08/31/21 10/15/21 Yes Julian Hy, MD   aspirin 81 mg Oral Tablet, Chewable Chew 1 Tablet (81 mg total) Once a day Take one tablet every day for 30 days   Yes Provider, Historical   budesonide-formoteroL (SYMBICORT) 160-4.5 mcg/actuation Inhalation oral inhaler Take 2 Puffs by inhalation Twice daily    Provider, Historical   calcitrioL (ROCALTROL) 0.5 mcg Oral Capsule Take 2 Capsules (1 mcg total) by mouth Every Monday, Wednesday and Friday   Yes Provider, Historical   cinacalcet (SENSIPAR) 30 mg Oral Tablet Take 1 Tablet (30 mg total) by mouth Every evening   Yes Provider, Historical   cloNIDine (CATAPRES-TTS) 0.3 mg/24 hr Transdermal Patch Weekly Place 1 Patch (0.3 mg total) on the skin Every 7 days 08/02/21  Yes Provider, Historical   cloNIDine HCL (CATAPRES) 0.2 mg Oral Tablet Take 1 Tablet  (0.2 mg total) by mouth Twice daily Take one tablet twice a day   Yes Provider, Historical   clopidogreL (PLAVIX) 75 mg Oral Tablet Take 1 Tablet (75 mg total) by mouth Once a day   Yes Provider, Historical   cyanocobalamin (VITAMIN B 12) 1,000 mcg Oral Tablet Take 1 Tablet (1,000 mcg total) by mouth Once a day with lunch  Patient not taking: Reported on 10/15/2021 10/03/21   Sherol Dade, PA-C   cyclobenzaprine (FLEXERIL) 5 mg Oral Tablet Take 1 Tablet (5 mg total) by mouth Twice per day as needed for Muscle spasms   Yes Provider, Historical   dilTIAZem (CARDIZEM CD) 180 mg Oral Capsule, Sust. Release 24 hr Take 1 Capsule (180 mg total) by mouth Once a day for 30 days  Patient not taking: Reported on 10/15/2021 08/19/21 09/18/21  Scudiere, Loa Socks, APRN   diphenhydrAMINE (BENADRYL) 50 mg Oral Capsule Take 1 Capsule (50 mg total) by mouth Every 6 hours as needed for Itching  Patient taking differently: Take 25 mg by mouth Every 6 hours as needed for Itching 10/03/21  Yes Sherol Dade, PA-C   doxazosin (CARDURA) 4 mg Oral Tablet Take 1 Tablet (4 mg total) by mouth Every evening   Yes Provider, Historical   epoetin alfa-epbx (RETACRIT) 10,000 unit/mL Injection Solution Infuse 1 mL (10,000 Units total) into a venous catheter Give in Dialysis for 5 days  Patient not taking: Reported on 10/15/2021 10/03/21 10/08/21  Sherol Dade, PA-C   famotidine (PEPCID) 40 mg Oral Tablet Take 1 Tablet (40 mg total) by mouth Once a day Take one tablet once a day   Yes Provider, Historical   folic acid (FOLVITE) 1 mg Oral Tablet Take 1 Tablet (1 mg total) by mouth Once a day  Patient not taking: Reported on 10/15/2021 10/03/21   Sherol Dade, PA-C   furosemide (LASIX) 80 mg Oral Tablet Take 2 Tablets (160 mg total) by mouth Once a day Take after dialysis on dialysis days; otherwise take in morning daily.   Yes  Provider, Historical   guaiFENesin (MUCINEX) 600 mg Oral Tablet Extended Release 12hr Take 1 Tablet (600 mg total) by mouth  Every 12 hours  Patient not taking: Reported on 10/15/2021    Provider, Historical   hydrALAZINE (APRESOLINE) 100 mg Oral Tablet Take 1 Tablet (100 mg total) by mouth Twice daily   Yes Provider, Historical   HYDROmorphone (DILAUDID) 2 mg/mL Injection Syringe Infuse 0.2 mL (0.4 mg total) into a venous catheter Every 4 hours as needed 10/03/21   Sherol Dade, PA-C   ipratropium-albuterol 0.5 mg-3 mg(2.5 mg base)/3 mL Solution for Nebulization Take 3 mL by nebulization Four times a day as needed 10/03/21   Sherol Dade, PA-C   iron sucrose (VENOFER) 100 mg iron/5 mL Intravenous Solution Infuse 5 mL (100 mg total) into a venous catheter Give in Dialysis for 5 days  Patient not taking: Reported on 10/15/2021 10/03/21 10/08/21  Sherol Dade, PA-C   isosorbide mononitrate (IMDUR) 120 mg Oral Tablet Sustained Release 24 hr Take 1 Tablet (120 mg total) by mouth Every morning for 30 days 08/20/21 10/15/21 Yes Scudiere, Loa Socks, APRN   labetaloL (NORMODYNE) 300 mg Oral Tablet Take 1 Tablet (300 mg total) by mouth Twice daily 10/03/21  Yes Sherol Dade, PA-C   labetaloL (TRANDATE) 5 mg/mL Intravenous Solution Infuse 2 mL (10 mg total) into a venous catheter Every one hour as needed for up to 1 day 10/03/21 10/04/21  Sherol Dade, PA-C   loratadine (CLARITIN) 10 mg Oral Tablet Take 1 Tablet (10 mg total) by mouth Once a day  Patient not taking: Reported on 10/15/2021    Provider, Historical   losartan (COZAAR) 100 mg Oral Tablet Take 1 Tablet (100 mg total) by mouth Every night Take one tablet everyday at bedtime   Yes Provider, Historical   montelukast (SINGULAIR) 10 mg Oral Tablet Take 1 Tablet (10 mg total) by mouth Every evening   Yes Provider, Historical   nitroGLYCERIN in 5 % dextrose 50 mg/250 mL (200 mcg/mL) Intravenous Solution Infuse 5 mcg/min into a venous catheter Cath Lab Continuous  Patient not taking: Reported on 10/15/2021 10/03/21   Sherol Dade, PA-C   ondansetron [facility]) 2 mg/mL Injection Solution  Infuse 2 mL (4 mg total) into a venous catheter Every 8 hours as needed 10/03/21   Sherol Dade, PA-C   oxyCODONE-acetaminophen (PERCOCET) 5-325 mg Oral Tablet Take 1 Tablet by mouth Every 6 hours as needed 10/03/21   Sherol Dade, PA-C   pantoprazole (PROTONIX) 40 mg Oral Tablet, Delayed Release (E.C.) Take 1 Tablet (40 mg total) by mouth Once a day   Yes Provider, Historical   rosuvastatin (CRESTOR) 40 mg Oral Tablet Take 1 Tablet (40 mg total) by mouth Every evening Take one tablet every day   Yes Provider, Historical   spironolactone (ALDACTONE) 25 mg Oral Tablet Take 2 Tablets (50 mg total) by mouth Twice daily Patient takes 2 tablets of 25 mg twice a day   Yes Provider, Historical   albuterol sulfate (PROVENTIL OR VENTOLIN OR PROAIR) 90 mcg/actuation Inhalation oral inhaler Take 1-2 Puffs by inhalation Every 6 hours as needed  10/03/21  Provider, Historical   cloNIDine HCL (CATAPRES) 0.2 mg Oral Tablet Take 1 Tablet (0.2 mg total) by mouth Twice daily for 30 days 08/31/21 10/03/21  Julian Hy, MD   doxycycline hyclate (VIBRAMYCIN) 100 mg Oral Capsule Take 1 Capsule (100 mg total) by mouth Twice daily for 5 days 08/31/21 10/03/21  Julian Hy, MD   ferric citrate (AURYXIA ORAL) Take 1 g by mouth Three times daily before meals "Take 2 tablets three times daily before meals and 1 tablet before snacks"  10/03/21  Provider, Historical   labetaloL (NORMODYNE) 300 mg Oral Tablet Take 1 Tablet (300 mg total) by mouth Every 12 hours for 30 days 08/19/21 10/03/21  Scudiere, Loa Socks, APRN   nitroGLYCERIN (NITROSTAT) 0.4 mg Sublingual Tablet, Sublingual Place 1 Tablet (0.4 mg total) under the tongue Every 5 minutes for 3 doses for 3 doses over 15 minutes 06/23/21 10/03/21  Elder, Gildardo Cranker, MD   ondansetron [facility]) 4 mg Oral Tablet Take 1 Tablet (4 mg total) by mouth Every 12 hours as needed for Nausea/Vomiting  10/03/21  Provider, Historical   vancomycin 1,500 mg in NS 15 mL infusion 1,500  mg Give in Dialysis Mix and infuse per policy of [facility]. 08/19/21 10/01/21  Scudiere, Loa Socks, APRN          Current medications   acetaminophen (TYLENOL) tablet, 650 mg, Oral, Q6H PRN  acetaminophen (TYLENOL) tablet, 650 mg, Oral, Q4H PRN  albuterol (PROVENTIL) 2.5 mg / 3 mL (0.083%) neb solution, 2.5 mg, Nebulization, 4x/day  amLODIPine (NORVASC) tablet, 10 mg, Oral, Daily  apixaban (ELIQUIS) tablet, 2.5 mg, Oral, 2x/day  aspirin chewable tablet 81 mg, 81 mg, Oral, Daily  atorvastatin (LIPITOR) tablet, 80 mg, Oral, QPM  budesonide (PULMICORT RESPULES) 0.5 mg/2 mL nebulizer suspension, 0.5 mg, Nebulization, 2x/day  calcitriol (ROCALTROL) capsule, 1 mcg, Oral, M, W, and F  cinacalcet (SENSIPAR) tablet, 30 mg, Oral, QPM  cloNIDine (CATAPRES) tablet, 0.2 mg, Oral, 2x/day  cloNIDine (CATAPRES-TTS) transdermal patch (mg/24 hr), 0.3 mg, Transdermal, Q7 Days  clopidogrel (PLAVIX) 75 mg tablet, 75 mg, Oral, Daily  cyanocobalamin (VITAMIN B12) tablet, 1,000 mcg, Oral, Daily with Lunch  cyclobenzaprine (FLEXERIL) tablet, 5 mg, Oral, 2x/day PRN  diphenhydrAMINE (BENADRYL) capsule, 50 mg, Oral, Q6H PRN  doxazosin (CARDURA) tablet, 4 mg, Oral, QPM  [START ON 10/18/2021] epoetin alfa-epbx (RETACRIT) 10,000 units/mL injection, 10,000 Units, Intravenous, Every MO, WE, and FR Give in Dialysis  famotidine (PEPCID) tablet, 40 mg, Oral, Daily  folic acid (FOLVITE) tablet, 1 mg, Oral, Daily  furosemide (LASIX) tablet, 160 mg, Oral, Daily  guaiFENesin (MUCINEX) extended release tablet - for cough (expectorant), 600 mg, Oral, Q12H  hydrALAZINE (APRESOLINE) tablet, 100 mg, Oral, 2x/day  ipratropium-albuterol 0.5 mg-3 mg(2.5 mg base)/3 mL Solution for Nebulization, 3 mL, Nebulization, 4x/day PRN  isosorbide mononitrate (IMDUR) 24 hr extended release tablet, 120 mg, Oral, QAM  labetalol (NORMODYNE) tablet, 300 mg, Oral, 2x/day  loratadine (CLARITIN) tablet, 10 mg, Oral, Daily  losartan (COZAAR) tablet, 100 mg, Oral,  NIGHTLY  midodrine (PROAMITINE) tablet, 5 mg, Oral, DIALYSIS DAILY PRN  montelukast (SINGULAIR) 10 mg tablet, 10 mg, Oral, QPM  morphine 2 mg/mL injection, 2 mg, Intravenous, Q8H PRN  nitroGLYCERIN (NITROSTAT) sublingual tablet, 0.4 mg, Sublingual, Q5 Min PRN  ondansetron (ZOFRAN) 2 mg/mL injection, 4 mg, Intravenous, Q8H PRN  pantoprazole (PROTONIX) delayed release tablet, 40 mg, Oral, Daily  spironolactone (ALDACTONE) tablet, 50 mg, Oral, 2X/day            Physical:  Filed Vitals:    10/16/21 0655 10/16/21 0737 10/16/21 0837 10/16/21 0900   BP: (!) 164/98   (!) 172/103   Pulse: 75   72   Resp: 18 20  18    Temp: 37.2 C (99 F)   36.9 C (98.4 F)   SpO2: 96%  95%  Intake/Output Summary (Last 24 hours) at 10/16/2021 1121  Last data filed at 10/16/2021 1000  Gross per 24 hour   Intake --   Output 50 ml   Net -50 ml        Patient is alert awake and oriented not in acute distress.  Normal mood and affect.  HEENT normocephalic atraumatic.  Eye exam normal inspection.    Mucous membranes moist, no jaundice.  Neck exam no JVD normal inspection.  Cardiovascular system: Regular rate and rhythm no murmurs rubs or gallops. No chest wall tenderness  Lungs:  Breath sounds diminished bilaterally.  PermCath in place.  Abdomen soft nontender nondistended.  Extremities no clubbing cyanosis or edema   Neuro exam: EOMI, normal speech    Labs:  CBC:     9.5 (08/23 0253) \   7.7* (08/23 0253) /   277 (08/23 0253)      / 22.7* (08/23 0253) \          BMP:   131* (08/22 2052) 95* (08/22 2052) 64* (08/22 2052)    /     75 (08/22 2052)   5.2* (08/22 2052) 21 (08/22 2052) 15.07* (08/22 2052) \                 Diagnostic studies:  Imaging:   ECG 12 LEAD  Sinus rhythm with 1st degree AV block  Minimal voltage criteria for LVH, may be normal variant ( [employer] product )  Borderline ECG  Confirmed by Anderson Malta ([id number]) on 10/15/2021 10:03:52 PM      Assessments:  Active Hospital Problems   (*Primary Problem)    Diagnosis    Hypertensive  urgency    Acute respiratory failure with hypoxia (CMS HCC)    Anemia in chronic kidney disease    Congestive heart failure (CMS HCC)    GERD (gastroesophageal reflux disease)    COPD (chronic obstructive pulmonary disease) (CMS HCC)    End-stage renal disease on hemodialysis (CMS HCC)     Formatting of this note might be different from the original.  On HD MWF, sees Dr. Haskel Schroeder [facility]         Plan:         End-stage renal disease on hemodialysis  -Continue dialysis 3 times a week on MWF  - may need extra HD in am.  -please refer to dialysis orders    Anemia  -Monitor  -Epogen    Generalized body ache  -1 dose of Toradol    Heart disease  -we will need outpatient follow-up she may need open heart surgery continue current blood thinners.  -keep hemoglobin close to 10.    CKD MBD  -Phosphorus, Ca, PTH monitor  -binders for Phos > 5.5  -aim for Phos level 3.5-5.5  -Renal diet    Acid-base  -Stable  -Continue dialysis    Electrolytes  -Monitor  -See dialysis orders  -Replace as needed    Volume status  -Fluid restriction  -Dialysis               MY ORDERS LAST 24 (24h ago, onward)       Start     Ordered    10/18/21 0700  epoetin alfa-epbx (RETACRIT) 10,000 units/mL injection  MO, WE, AND FR GIVE IN DIALYSIS         10/16/21 0859    10/17/21 0530  PHOSPHORUS  0530 - AM DRAW (labs only)  10/16/21 0859    10/16/21 0900  FERRITIN  (IRON STUDY PANEL)  ONE TIME         10/16/21 0859    10/16/21 0900  IRON TRANSFERRIN AND TIBC  (IRON STUDY PANEL)  ONE TIME         10/16/21 0859    10/16/21 0845  HEMODIALYSIS  ONE TIME        Comments: Dialysis access, use CVC unless patient has AV fistula or AV graft  Heparin:  2000 units at the start of dialysis, as long as hemoglobin above 7, platelets above 5,0000 and No GI Bleed/active bleeding   UF Goal 1.0- 3.0 L  Contact me for any questions    10/16/21 0842    10/16/21 0845  WEIGH PATIENT PRE AND POST DIALYSIS TREATMENT  EVERY OTHER DAY         10/16/21 0842     10/16/21 0842  midodrine (PROAMITINE) tablet  DIALYSIS DAILY PRN        Note to Pharmacy: Administer at start of HD.  May repeat the dose after 30 min from start of HD    10/16/21 0842                                Beather Arbour, MD, FASN, 10/16/2021, 11:21

## 2021-10-16 NOTE — Nurses Notes (Signed)
Patient reports 10/10 chest pressure. EKG and trops ordered. Nitrostat refused. Attempted to educate patient on importance of dilating blood vessels during HTN crisis. No evidence of learning

## 2021-10-16 NOTE — Progress Notes (Signed)
[facility]  IP PROGRESS NOTE      [name], [name]  Date of Admission:  10/15/2021  Date of Birth:  March 07, 1975  Date of Service:  10/16/2021    Hospital Day:  LOS: 1 day     Subjective:   Patient seen examined for follow-up of acute respiratory failure with hypoxia, COPD exacerbation, fluid overload, end-stage renal disease on hemodialysis, coronary artery disease with recent PCI on 08/15 and stent placement.    Vital Signs:  Temp (24hrs) Max:37.3 C (99.2 F)      Temperature: 36.9 C (98.4 F)  BP (Non-Invasive): (!) 172/103  MAP (Non-Invasive): 117 mmHG  Heart Rate: 82  Respiratory Rate: 18  SpO2: 95 %    Current Medications:  acetaminophen (TYLENOL) tablet, 650 mg, Oral, Q6H PRN  acetaminophen (TYLENOL) tablet, 650 mg, Oral, Q4H PRN  albuterol (PROVENTIL) 2.5 mg / 3 mL (0.083%) neb solution, 2.5 mg, Nebulization, 4x/day  alcohol 62 % (NOZIN NASAL SANITIZER) nasal solution, 1 Each, Each Nostril, 2x/day  amLODIPine (NORVASC) tablet, 10 mg, Oral, Daily  apixaban (ELIQUIS) tablet, 2.5 mg, Oral, 2x/day  aspirin chewable tablet 81 mg, 81 mg, Oral, Daily  atorvastatin (LIPITOR) tablet, 80 mg, Oral, QPM  budesonide (PULMICORT RESPULES) 0.5 mg/2 mL nebulizer suspension, 0.5 mg, Nebulization, 2x/day  calcitriol (ROCALTROL) capsule, 1 mcg, Oral, M, W, and F  cinacalcet (SENSIPAR) tablet, 30 mg, Oral, QPM  cloNIDine (CATAPRES) tablet, 0.2 mg, Oral, 2x/day  cloNIDine (CATAPRES-TTS) transdermal patch (mg/24 hr), 0.3 mg, Transdermal, Q7 Days  clopidogrel (PLAVIX) 75 mg tablet, 75 mg, Oral, Daily  cyanocobalamin (VITAMIN B12) tablet, 1,000 mcg, Oral, Daily with Lunch  cyclobenzaprine (FLEXERIL) tablet, 5 mg, Oral, 2x/day PRN  diphenhydrAMINE (BENADRYL) capsule, 50 mg, Oral, Q6H PRN  doxazosin (CARDURA) tablet, 4 mg, Oral, QPM  [START ON 10/18/2021] epoetin alfa-epbx (RETACRIT) 10,000 units/mL injection, 10,000 Units, Intravenous, Every MO, WE, and FR Give in  Dialysis  epoetin alfa-epbx (RETACRIT) 10,000 units/mL injection, 10,000 Units, Intravenous, Once  famotidine (PEPCID) tablet, 40 mg, Oral, Daily  folic acid (FOLVITE) tablet, 1 mg, Oral, Daily  furosemide (LASIX) tablet, 160 mg, Oral, Daily  guaiFENesin (MUCINEX) extended release tablet - for cough (expectorant), 600 mg, Oral, Q12H  hydrALAZINE (APRESOLINE) tablet, 100 mg, Oral, 2x/day  ipratropium-albuterol 0.5 mg-3 mg(2.5 mg base)/3 mL Solution for Nebulization, 3 mL, Nebulization, 4x/day PRN  isosorbide mononitrate (IMDUR) 24 hr extended release tablet, 120 mg, Oral, QAM  ketorolac (TORADOL) 30 mg/mL injection, 15 mg, Intravenous, Now  labetalol (NORMODYNE) tablet, 300 mg, Oral, 2x/day  loratadine (CLARITIN) tablet, 10 mg, Oral, Daily  losartan (COZAAR) tablet, 100 mg, Oral, NIGHTLY  methylPREDNISolone sod succ (SOLU-MEDROL) 125 mg/2 mL injection, 62.5 mg, Intravenous, Q6H  midodrine (PROAMITINE) tablet, 5 mg, Oral, DIALYSIS DAILY PRN  montelukast (SINGULAIR) 10 mg tablet, 10 mg, Oral, QPM  morphine 2 mg/mL injection, 2 mg, Intravenous, Q8H PRN  nitroGLYCERIN (NITROSTAT) sublingual tablet, 0.4 mg, Sublingual, Q5 Min PRN  ondansetron (ZOFRAN) 2 mg/mL injection, 4 mg, Intravenous, Q8H PRN  pantoprazole (PROTONIX) delayed release tablet, 40 mg, Oral, Daily  spironolactone (ALDACTONE) tablet, 50 mg, Oral, 2X/day        Current Orders:  Active Orders   Lab    C-REACTIVE PROTEIN (CRP)     Frequency: ONE TIME     Number of Occurrences: 1 Occurrences    CBC     Frequency: ONE TIME     Number of Occurrences: 1 Occurrences  CBC     Frequency: ONE TIME     Number of Occurrences: 1 Occurrences    COMPREHENSIVE METABOLIC PANEL, NON-FASTING     Frequency: ONE TIME     Number of Occurrences: 1 Occurrences    COMPREHENSIVE METABOLIC PANEL, NON-FASTING     Frequency: ONE TIME     Number of Occurrences: 1 Occurrences    MAGNESIUM     Frequency: ONE TIME     Number of Occurrences: 1 Occurrences    MAGNESIUM     Frequency: ONE  TIME     Number of Occurrences: 1 Occurrences    PHOSPHORUS     Frequency: 0530 - AM DRAW     Number of Occurrences: 1 Occurrences   Diet    DIET RENAL Do you want to initiate MNT Protocol? Yes     Frequency: All Meals     Number of Occurrences: 1 Occurrences   Nursing    ACTIVITY Activity: AS TOLERATED; Instructions: WITH ASSIST     Frequency: UNTIL DISCONTINUED     Number of Occurrences: Until Specified    APIXABAN NURSING ORDER     Frequency: UNTIL DISCONTINUED     Number of Occurrences: Until Specified     Order Comments: Nursing Instructions:    1.  Print apixaban (Eliquis) guide using the link in this order.   2.  Nurse to provide apixaban patient guide to all patients and be sure that all new starts have received education by the trained anticoagulation educator.  If additional questions, contact pharmacy.           CONTINUOUS CARDIAC MONITORING (ED USE ONLY)     Frequency: CONTINUOUS     Number of Occurrences: Until Specified    NOTIFY MD     Frequency: PRN     Number of Occurrences: Until Specified    Notify MD Vital Signs     Frequency: PRN     Number of Occurrences: Until Specified    PT IS MEDIUM RISK FOR VENOUS THROMBOEMBOLISM     Frequency: CONTINUOUS     Number of Occurrences: Until Specified    PULSE OXIMETRY CONTINUOUS     Frequency: CONTINUOUS     Number of Occurrences: Until Specified    PULSE OXIMETRY Q4H     Frequency: Q4H     Number of Occurrences: Until Specified    TELEMETRY MONITORING - Continuous     Frequency: CONTINUOUS     Number of Occurrences: Until Specified    VITAL SIGNS  Q4H     Frequency: Q4H     Number of Occurrences: Until Specified    VITAL SIGNS MULTI FREQUENCY     Frequency: UNTIL DISCONTINUED     Number of Occurrences: Until Specified    WAS PATIENT ON APIXABAN PRIOR TO ADMISSION?     Frequency: UNTIL DISCONTINUED     Number of Occurrences: Until Specified    WEIGH PATIENT PRE AND POST DIALYSIS TREATMENT     Frequency: EVERY OTHER DAY     Number of Occurrences: Until  Specified   Code Status    FULL CODE     Frequency: CONTINUOUS     Number of Occurrences: Until Specified   Consult    IP CONSULT TO NEPHROLOGY Requested Provider; ETER, AHMAD     Frequency: ONE TIME     Number of Occurrences: 1 Occurrences   IV    INSERT & MAINTAIN PERIPHERAL IV ACCESS     Frequency: UNTIL DISCONTINUED  Number of Occurrences: Until Specified   Dialysis    HEMODIALYSIS     Frequency: ONE TIME     Number of Occurrences: 1 Occurrences     Order Comments: Dialysis access, use CVC unless patient has AV fistula or AV graft  Heparin:  2000 units at the start of dialysis, as long as hemoglobin above 7, platelets above 5,0000 and No GI Bleed/active bleeding   UF Goal 1.0- 3.0 L  Contact me for any questions     Medications    acetaminophen (TYLENOL) tablet     Frequency: Q6H PRN     Dose: 650 mg     Route: Oral    acetaminophen (TYLENOL) tablet     Frequency: Q4H PRN     Dose: 650 mg     Route: Oral    albuterol (PROVENTIL) 2.5 mg / 3 mL (0.083%) neb solution     Frequency: 4x/day     Dose: 2.5 mg     Route: Nebulization    alcohol 62 % (NOZIN NASAL SANITIZER) nasal solution     Frequency: 2x/day     Dose: 1 Each     Route: Each Nostril    amLODIPine (NORVASC) tablet     Frequency: Daily     Dose: 10 mg     Route: Oral    apixaban (ELIQUIS) tablet     Frequency: 2x/day     Dose: 2.5 mg     Route: Oral    aspirin chewable tablet 81 mg     Frequency: Daily     Dose: 81 mg     Route: Oral    atorvastatin (LIPITOR) tablet     Frequency: QPM     Dose: 80 mg     Route: Oral    budesonide (PULMICORT RESPULES) 0.5 mg/2 mL nebulizer suspension     Frequency: 2x/day     Dose: 0.5 mg     Route: Nebulization    calcitriol (ROCALTROL) capsule     Frequency: M, W, and F     Dose: 1 mcg     Route: Oral    cinacalcet (SENSIPAR) tablet     Frequency: QPM     Dose: 30 mg     Route: Oral    cloNIDine (CATAPRES) tablet     Frequency: 2x/day     Dose: 0.2 mg     Route: Oral    cloNIDine (CATAPRES-TTS) transdermal patch  (mg/24 hr)     Frequency: Q7 Days     Dose: 0.3 mg     Route: Transdermal    clopidogrel (PLAVIX) 75 mg tablet     Frequency: Daily     Dose: 75 mg     Route: Oral    cyanocobalamin (VITAMIN B12) tablet     Frequency: Daily with Lunch     Dose: 1,000 mcg     Route: Oral    cyclobenzaprine (FLEXERIL) tablet     Frequency: 2x/day PRN     Dose: 5 mg     Route: Oral    diphenhydrAMINE (BENADRYL) capsule     Frequency: Q6H PRN     Dose: 50 mg     Route: Oral    doxazosin (CARDURA) tablet     Frequency: QPM     Dose: 4 mg     Route: Oral    epoetin alfa-epbx (RETACRIT) 10,000 units/mL injection     Frequency: Every MO, WE, and FR Give in Dialysis  Dose: 10,000 Units     Route: Intravenous    epoetin alfa-epbx (RETACRIT) 10,000 units/mL injection     Frequency: Once     Dose: 10,000 Units     Route: Intravenous    famotidine (PEPCID) tablet     Frequency: Daily     Dose: 40 mg     Route: Oral    folic acid (FOLVITE) tablet     Frequency: Daily     Dose: 1 mg     Route: Oral    furosemide (LASIX) tablet     Frequency: Daily     Dose: 160 mg     Route: Oral    guaiFENesin (MUCINEX) extended release tablet - for cough (expectorant)     Frequency: Q12H     Dose: 600 mg     Route: Oral    hydrALAZINE (APRESOLINE) tablet     Frequency: 2x/day     Dose: 100 mg     Route: Oral    ipratropium-albuterol 0.5 mg-3 mg(2.5 mg base)/3 mL Solution for Nebulization     Frequency: 4x/day PRN     Dose: 3 mL     Route: Nebulization    isosorbide mononitrate (IMDUR) 24 hr extended release tablet     Frequency: QAM     Dose: 120 mg     Route: Oral    ketorolac (TORADOL) 30 mg/mL injection     Frequency: Now     Dose: 15 mg     Route: Intravenous    labetalol (NORMODYNE) tablet     Frequency: 2x/day     Dose: 300 mg     Route: Oral    loratadine (CLARITIN) tablet     Frequency: Daily     Dose: 10 mg     Route: Oral    losartan (COZAAR) tablet     Frequency: NIGHTLY     Dose: 100 mg     Route: Oral    methylPREDNISolone sod succ (SOLU-MEDROL)  125 mg/2 mL injection     Frequency: Q6H     Dose: 62.5 mg     Route: Intravenous    midodrine (PROAMITINE) tablet     Frequency: DIALYSIS DAILY PRN     Dose: 5 mg     Route: Oral     Order Comments: Administer at start of HD.  May repeat the dose after 30 min from start of HD    montelukast (SINGULAIR) 10 mg tablet     Frequency: QPM     Dose: 10 mg     Route: Oral    morphine 2 mg/mL injection     Frequency: Q8H PRN     Dose: 2 mg     Route: Intravenous    nitroGLYCERIN (NITROSTAT) sublingual tablet     Frequency: Q5 Min PRN     Dose: 0.4 mg     Route: Sublingual    ondansetron (ZOFRAN) 2 mg/mL injection     Frequency: Q8H PRN     Dose: 4 mg     Route: Intravenous    pantoprazole (PROTONIX) delayed release tablet     Frequency: Daily     Dose: 40 mg     Route: Oral    spironolactone (ALDACTONE) tablet     Frequency: 2X/day     Dose: 50 mg     Route: Oral        Review of Systems:  Focused review of system was completed. Refer to the HPI for ROS  details.     Today's Physical Exam:  Physical Exam  Constitutional:       Appearance: Normal appearance.   Cardiovascular:      Rate and Rhythm: Normal rate and regular rhythm.   Pulmonary:      Breath sounds: Wheezing present.   Abdominal:      General: Abdomen is flat.      Palpations: Abdomen is soft.   Skin:     General: Skin is warm and dry.   Neurological:      Mental Status: She is alert and oriented to person, place, and time.   Psychiatric:         Mood and Affect: Mood normal.      I/O:  I/O last 24 hours:    Intake/Output Summary (Last 24 hours) at 10/16/2021 1300  Last data filed at 10/16/2021 1000  Gross per 24 hour   Intake --   Output 50 ml   Net -50 ml     I/O current shift:  08/23 0700 - 08/23 1859  In: -   Out: 50 [Urine:50]    Labs  Please indicate ordered or reviewed)  Reviewed: I have reviewed all lab results.    Problem List:  Active Hospital Problems   (*Primary Problem)    Diagnosis    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)    H/O heart  artery stent    Hypertensive urgency    Acute respiratory failure with hypoxia (CMS HCC)    Fluid overload    Anemia in chronic kidney disease    End-stage renal disease on hemodialysis (CMS HCC)       Assessment/ Plan:     1. Acute respiratory failure with hypoxia likely secondary to COPD exacerbation versus fluid overload:  Patient has been noncompliant with hemodialysis.  Continue with nebs, Solu-Medrol for COPD exacerbation.  CRP pending.  Continue with supplemental oxygen titration.    2.  End-stage renal disease on hemodialysis:  Nephrology consulted    3. History of coronary artery disease with recent stent placement on 08/15:  Continue with aspirin and Plavix.  Per records from [facility] patient is a high risk candidate for CABG.    Carron Brazen, DO      DVT/PE Prophylaxis: Apixaban    Disposition Planning: Home discharge      Advance Care Planning Discussed:  No

## 2021-10-16 NOTE — Nurses Notes (Signed)
Pt stating she has had nose bleeds and spit up blood with clot. Pt on Eliquis. Pt also complaining of vaginal itching. Provider notified.

## 2021-10-17 ENCOUNTER — Inpatient Hospital Stay (HOSPITAL_COMMUNITY): Payer: Commercial Managed Care - PPO

## 2021-10-17 ENCOUNTER — Other Ambulatory Visit: Payer: Self-pay

## 2021-10-17 DIAGNOSIS — J441 Chronic obstructive pulmonary disease with (acute) exacerbation: Secondary | ICD-10-CM

## 2021-10-17 DIAGNOSIS — Z992 Dependence on renal dialysis: Secondary | ICD-10-CM

## 2021-10-17 DIAGNOSIS — E8770 Fluid overload, unspecified: Secondary | ICD-10-CM

## 2021-10-17 DIAGNOSIS — N186 End stage renal disease: Secondary | ICD-10-CM

## 2021-10-17 LAB — COMPREHENSIVE METABOLIC PANEL, NON-FASTING
ALBUMIN/GLOBULIN RATIO: 1.1 (ref 0.8–1.4)
ALBUMIN: 4.1 g/dL (ref 3.5–5.7)
ALKALINE PHOSPHATASE: 68 U/L (ref 34–104)
ALT (SGPT): <7 U/L — ABNORMAL LOW (ref 7–52)
ANION GAP: 14 mmol/L — ABNORMAL HIGH (ref 4–13)
AST (SGOT): 12 U/L — ABNORMAL LOW (ref 13–39)
BILIRUBIN TOTAL: 0.9 mg/dL (ref 0.3–1.2)
BUN/CREA RATIO: 4 — ABNORMAL LOW (ref 6–22)
BUN: 44 mg/dL — ABNORMAL HIGH (ref 7–25)
CALCIUM, CORRECTED: 9.5 mg/dL (ref 8.9–10.8)
CALCIUM: 9.6 mg/dL (ref 8.6–10.3)
CHLORIDE: 91 mmol/L — ABNORMAL LOW (ref 98–107)
CO2 TOTAL: 25 mmol/L (ref 21–31)
CREATININE: 10.82 mg/dL — ABNORMAL HIGH (ref 0.60–1.30)
ESTIMATED GFR: 4 mL/min/{1.73_m2} — ABNORMAL LOW (ref >59)
GLOBULIN: 3.7 (ref 2.9–5.4)
GLUCOSE: 183 mg/dL — ABNORMAL HIGH (ref 74–109)
OSMOLALITY, CALCULATED: 277 mOsm/kg (ref 270–290)
POTASSIUM: 4.6 mmol/L (ref 3.5–5.1)
PROTEIN TOTAL: 7.8 g/dL (ref 6.4–8.9)
SODIUM: 130 mmol/L — ABNORMAL LOW (ref 136–145)

## 2021-10-17 LAB — CBC
HCT: 23.8 % — ABNORMAL LOW (ref 37.0–47.0)
HGB: 8.2 g/dL — ABNORMAL LOW (ref 12.5–16.0)
MCH: 29.2 pg (ref 27.0–32.0)
MCHC: 34.4 g/dL (ref 32.0–36.0)
MCV: 84.8 fL (ref 78.0–99.0)
MPV: 6.9 fL — ABNORMAL LOW (ref 7.4–10.4)
PLATELETS: 275 10*3/uL (ref 140–440)
RBC: 2.81 10*6/uL — ABNORMAL LOW (ref 4.20–5.40)
RDW: 21.1 % — ABNORMAL HIGH (ref 11.6–14.8)
WBC: 5.4 10*3/uL (ref 4.0–10.5)
WBCS UNCORRECTED: 5.4 10*3/uL

## 2021-10-17 LAB — ECG 12 LEAD
Atrial Rate: 82 {beats}/min
Calculated P Axis: 67 degrees
Calculated R Axis: 20 degrees
Calculated T Axis: 87 degrees
PR Interval: 236 ms
QRS Duration: 110 ms
QT Interval: 426 ms
QTC Calculation: 497 ms
Ventricular rate: 82 {beats}/min

## 2021-10-17 LAB — MAGNESIUM: MAGNESIUM: 2.4 mg/dL (ref 1.9–2.7)

## 2021-10-17 LAB — PHOSPHORUS: PHOSPHORUS: 6.5 mg/dL (ref 3.7–7.2)

## 2021-10-17 LAB — URINE CULTURE,ROUTINE: URINE CULTURE: 20000 — AB

## 2021-10-17 MED ORDER — BISACODYL 5 MG TABLET,DELAYED RELEASE
10.0000 mg | DELAYED_RELEASE_TABLET | Freq: Once | ORAL | Status: AC
Start: 2021-10-17 — End: 2021-10-17
  Administered 2021-10-17: 10 mg via ORAL
  Filled 2021-10-17: qty 2

## 2021-10-17 MED ORDER — ATORVASTATIN 10 MG TABLET
10.0000 mg | ORAL_TABLET | Freq: Every evening | ORAL | Status: DC
Start: 2021-10-17 — End: 2021-10-17

## 2021-10-17 MED ORDER — EPOETIN ALFA-EPBX 10,000 UNIT/ML INJECTION SOLUTION
10000.0000 [IU] | Freq: Once | INTRAMUSCULAR | Status: DC
Start: 2021-10-17 — End: 2021-10-21
  Filled 2021-10-17: qty 1

## 2021-10-17 MED ORDER — PROCHLORPERAZINE EDISYLATE 10 MG/2 ML (5 MG/ML) INJECTION SOLUTION
10.0000 mg | Freq: Four times a day (QID) | INTRAMUSCULAR | Status: DC | PRN
Start: 2021-10-17 — End: 2021-10-21
  Administered 2021-10-17 – 2021-10-20 (×4): 10 mg via INTRAVENOUS
  Filled 2021-10-17 (×4): qty 2

## 2021-10-17 MED ORDER — SODIUM CHLORIDE 0.9 % INTRAVENOUS PIGGYBACK
2.0000 g | INTRAVENOUS | Status: DC
Start: 2021-10-17 — End: 2021-10-21
  Administered 2021-10-17: 0 g via INTRAVENOUS
  Administered 2021-10-17: 2 g via INTRAVENOUS
  Administered 2021-10-18: 0 g via INTRAVENOUS
  Administered 2021-10-18 – 2021-10-19 (×2): 2 g via INTRAVENOUS
  Administered 2021-10-19: 0 g via INTRAVENOUS
  Administered 2021-10-20: 2 g via INTRAVENOUS
  Administered 2021-10-20: 0 g via INTRAVENOUS
  Filled 2021-10-17 (×4): qty 20

## 2021-10-17 MED ORDER — ATORVASTATIN 40 MG TABLET
40.0000 mg | ORAL_TABLET | Freq: Every evening | ORAL | Status: DC
Start: 2021-10-17 — End: 2021-10-21
  Administered 2021-10-17 – 2021-10-20 (×4): 40 mg via ORAL
  Filled 2021-10-17 (×4): qty 1

## 2021-10-17 MED ORDER — SODIUM CHLORIDE 0.9 % INTRAVENOUS SOLUTION
500.0000 mg | INTRAVENOUS | Status: DC
Start: 2021-10-17 — End: 2021-10-21
  Administered 2021-10-17: 0 mg via INTRAVENOUS
  Administered 2021-10-17 – 2021-10-18 (×2): 500 mg via INTRAVENOUS
  Administered 2021-10-18: 0 mg via INTRAVENOUS
  Administered 2021-10-19: 500 mg via INTRAVENOUS
  Administered 2021-10-19 – 2021-10-20 (×2): 0 mg via INTRAVENOUS
  Administered 2021-10-20: 500 mg via INTRAVENOUS
  Filled 2021-10-17 (×4): qty 5

## 2021-10-17 MED ORDER — HYDROXYZINE PAMOATE 25 MG CAPSULE
25.0000 mg | ORAL_CAPSULE | Freq: Four times a day (QID) | ORAL | Status: DC | PRN
Start: 2021-10-17 — End: 2021-10-21
  Administered 2021-10-17 – 2021-10-21 (×3): 25 mg via ORAL
  Filled 2021-10-17 (×3): qty 1

## 2021-10-17 NOTE — Consults (Signed)
[place]    Date of Service:  10/17/2021  [name]   46 y.o. female  Date of Admission:  10/15/2021  Date of Birth:  [birth year]  2     Reason for Consultation:  Chest pain and shortness of breath    Problem List:  Active Hospital Problems   (*Primary Problem)    Diagnosis    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)    H/O heart artery stent    Hypertensive urgency    Acute respiratory failure with hypoxia (CMS HCC)    Fluid overload    Anemia in chronic kidney disease    End-stage renal disease on hemodialysis (CMS HCC)       History of Present Illness:  [name] is a 46 y.o. 32 American female who presents with shortness of breath and left-sided chest pain.  Patient has known end-stage renal disease on hemodialysis.  She also has severe triple-vessel coronary disease and had cardiac catheterization August 10th which showed diffuse triple-vessel coronary artery disease.  She was sent to [facility] for possible bypass surgery but has not had it done.  She has COPD.  The last echocardiogram showed normal left ventricle systolic function with ejection fraction 60 65%.  She does have accelerated hypertension.  There is history of sleep apnea also.  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 She has a 10.00 pack-year smoking history. She has quit using smokeless tobacco. She reports that she does not currently use alcohol. She reports current drug use. Drug: Marijuana.     REVIEW OF SYSTEMS:  All systems have been reviewed and found to be negative except for as stated above in the history of present illness.       Constitutional - no appetite or weight  changes, no fatigue, no fevers, chills, or night sweats  Respiratory - no dyspnea or cough  Cardiovascular - dyspnea and chest pain  Gastrointestinal - no nausea, vomiting, diarrhea, or constipation, no dyspepsia  Skin - no rashes, color changes, or lesions  Musculoskeletal - no arthralgias or myalgias  Genitourinary - no urinary frequency or dysuria, no genital discharge  Neurologic - no vision or hearing changes, no weakness, no parasthesias   Psychiatric - mood has been appropriate      Allergies   Allergen Reactions    Lisinopril Swelling     Tongue and throat swelling    Cardene [Nicardipine]  Other Adverse Reaction (Add comment)     Chest pain     Oxycodone Itching       Medications:  Medications Prior to Admission       Prescriptions    acetaminophen (TYLENOL) 325 mg Oral Tablet    Take 2 Tablets (650 mg total) by mouth Every 4 hours as needed    amLODIPine (NORVASC) 10 mg Oral Tablet    Take 1 Tablet (10 mg total) by mouth Once a day Take one tablet every day    apixaban (ELIQUIS) 2.5 mg Oral Tablet    Take 1 Tablet (2.5 mg total) by mouth Twice daily  for 41 doses    aspirin 81 mg Oral Tablet, Chewable    Chew 1 Tablet (81 mg total) Once a day Take one tablet every day for 30 days    budesonide-formoteroL (SYMBICORT) 160-4.5 mcg/actuation Inhalation oral inhaler    Take 2 Puffs by inhalation Twice daily    calcitrioL (ROCALTROL) 0.5 mcg Oral Capsule    Take 2 Capsules (1 mcg total) by mouth Every Monday, Wednesday and Friday    cinacalcet (SENSIPAR) 30 mg Oral Tablet    Take 1 Tablet (30 mg total) by mouth Every evening    cloNIDine (CATAPRES-TTS) 0.3 mg/24 hr Transdermal Patch Weekly    Place 1 Patch (0.3 mg total) on the skin Every 7 days    cloNIDine HCL (CATAPRES) 0.2 mg Oral Tablet    Take 1 Tablet (0.2 mg total) by mouth Twice daily Take one tablet twice a day    clopidogreL (PLAVIX) 75 mg Oral Tablet    Take 1 Tablet (75 mg total) by mouth Once a day    cyanocobalamin (VITAMIN B 12) 1,000 mcg Oral Tablet    Take 1 Tablet (1,000 mcg total) by mouth Once a day with lunch    Patient not taking:  Reported on 10/15/2021    cyclobenzaprine (FLEXERIL) 5 mg Oral Tablet    Take 1 Tablet (5 mg total) by mouth Twice per day as needed for Muscle spasms    dilTIAZem (CARDIZEM CD) 180 mg Oral Capsule, Sust. Release 24 hr    Take 1 Capsule (180 mg total) by mouth Once a day for 30 days    Patient not taking:  Reported on 10/15/2021    diphenhydrAMINE (BENADRYL) 50 mg Oral Capsule    Take 1 Capsule (50 mg total) by mouth Every 6 hours as needed for Itching    Patient taking differently:  Take 25 mg by mouth Every 6 hours as needed for Itching    doxazosin (CARDURA) 4 mg Oral Tablet    Take 1 Tablet (4 mg total) by mouth Every evening    epoetin alfa-epbx (RETACRIT) 10,000 unit/mL Injection Solution    Infuse 1 mL (10,000 Units total) into a venous catheter Give in Dialysis for  5 days    Patient not taking:  Reported on 10/15/2021    famotidine (PEPCID) 40 mg Oral Tablet    Take 1 Tablet (40 mg total) by mouth Once a day Take one tablet once a day    folic acid (FOLVITE) 1 mg  Oral Tablet    Take 1 Tablet (1 mg total) by mouth Once a day    Patient not taking:  Reported on 10/15/2021    furosemide (LASIX) 80 mg Oral Tablet    Take 2 Tablets (160 mg total) by mouth Once a day Take after dialysis on dialysis days; otherwise take in morning daily.    guaiFENesin (MUCINEX) 600 mg Oral Tablet Extended Release 12hr    Take 1 Tablet (600 mg total) by mouth Every 12 hours    Patient not taking:  Reported on 10/15/2021    hydrALAZINE (APRESOLINE) 100 mg Oral Tablet    Take 1 Tablet (100 mg total) by mouth Twice daily    HYDROmorphone (DILAUDID) 2 mg/mL Injection Syringe    Infuse 0.2 mL (0.4 mg total) into a venous catheter Every 4 hours as needed    ipratropium-albuterol 0.5 mg-3 mg(2.5 mg base)/3 mL Solution for Nebulization    Take 3 mL by nebulization Four times a day as needed    iron sucrose (VENOFER) 100 mg iron/5 mL Intravenous Solution    Infuse 5 mL (100 mg total) into a venous catheter Give in Dialysis for 5 days    Patient not taking:  Reported on 10/15/2021    isosorbide mononitrate (IMDUR) 120 mg Oral Tablet Sustained Release 24 hr    Take 1 Tablet (120 mg total) by mouth Every morning for 30 days    labetaloL (NORMODYNE) 300 mg Oral Tablet    Take 1 Tablet (300 mg total) by mouth Twice daily    labetaloL (TRANDATE) 5 mg/mL Intravenous Solution    Infuse 2 mL (10 mg total) into a venous catheter Every one hour as needed for up to 1 day    loratadine (CLARITIN) 10 mg Oral Tablet    Take 1 Tablet (10 mg total) by mouth Once a day    Patient not taking:  Reported on 10/15/2021    losartan (COZAAR) 100 mg Oral Tablet    Take 1 Tablet (100 mg total) by mouth Every night Take one tablet everyday at bedtime    montelukast (SINGULAIR) 10 mg Oral Tablet    Take 1 Tablet (10 mg total) by mouth Every evening    nitroGLYCERIN in 5 % dextrose 50 mg/250 mL (200 mcg/mL) Intravenous Solution    Infuse 5 mcg/min into a venous catheter Cath Lab Continuous    Patient not taking:  Reported on 10/15/2021     ondansetron (ZOFRAN) 2 mg/mL Injection Solution    Infuse 2 mL (4 mg total) into a venous catheter Every 8 hours as needed    oxyCODONE-acetaminophen (PERCOCET) 5-325 mg Oral Tablet    Take 1 Tablet by mouth Every 6 hours as needed    pantoprazole (PROTONIX) 40 mg Oral Tablet, Delayed Release (E.C.)    Take 1 Tablet (40 mg total) by mouth Once a day    rosuvastatin (CRESTOR) 40 mg Oral Tablet    Take 1 Tablet (40 mg total) by mouth Every evening Take one tablet every day    spironolactone (ALDACTONE) 25 mg Oral Tablet    Take 2 Tablets (50 mg total) by mouth Twice daily Patient takes 2 tablets of 25 mg  twice a day          acetaminophen (TYLENOL) tablet, 650 mg, Oral, Q6H PRN  acetaminophen (TYLENOL) tablet, 650 mg, Oral, Q4H PRN  albuterol (PROVENTIL) 2.5 mg / 3 mL (0.083%) neb solution, 2.5 mg, Nebulization, 4x/day  alcohol 62 % (NOZIN NASAL SANITIZER) nasal solution, 1 Each, Each Nostril, 2x/day  amLODIPine (NORVASC) tablet, 10 mg, Oral, Daily  apixaban (ELIQUIS) tablet, 2.5 mg, Oral, 2x/day  aspirin chewable tablet 81 mg, 81 mg, Oral, Daily  atorvastatin (LIPITOR) tablet, 80 mg, Oral, QPM  budesonide (PULMICORT RESPULES) 0.5 mg/2 mL nebulizer suspension, 0.5 mg, Nebulization, 2x/day  calcitriol (ROCALTROL) capsule, 1 mcg, Oral, M, W, and F  cinacalcet (SENSIPAR) tablet, 30 mg, Oral, QPM  cloNIDine (CATAPRES) tablet, 0.2 mg, Oral, 2x/day  cloNIDine (CATAPRES-TTS) transdermal patch (mg/24 hr), 0.3 mg, Transdermal, Q7 Days  clopidogrel (PLAVIX) 75 mg tablet, 75 mg, Oral, Daily  cyanocobalamin (VITAMIN B12) tablet, 1,000 mcg, Oral, Daily with Lunch  cyclobenzaprine (FLEXERIL) tablet, 5 mg, Oral, 2x/day PRN  diphenhydrAMINE (BENADRYL) capsule, 50 mg, Oral, Q6H PRN  doxazosin (CARDURA) tablet, 4 mg, Oral, QPM  [START ON 10/18/2021] epoetin alfa-epbx (RETACRIT) 10,000 units/mL injection, 10,000 Units, Intravenous, Every MO, WE, and FR Give in Dialysis  famotidine (PEPCID) tablet, 40 mg, Oral, Daily  fluconazole  (DIFLUCAN) tablet, 200 mg, Oral, Daily  folic acid (FOLVITE) tablet, 1 mg, Oral, Daily  furosemide (LASIX) tablet, 160 mg, Oral, Daily  guaiFENesin (MUCINEX) extended release tablet - for cough (expectorant), 600 mg, Oral, Q12H  hydrALAZINE (APRESOLINE) injection 10 mg, 10 mg, Intravenous, Q6H PRN  hydrALAZINE (APRESOLINE) tablet, 100 mg, Oral, 2x/day  HYDROmorphone (DILAUDID) 2 mg/mL injection, 1 mg, Intravenous, Q4H PRN  ipratropium-albuterol 0.5 mg-3 mg(2.5 mg base)/3 mL Solution for Nebulization, 3 mL, Nebulization, 4x/day PRN  isosorbide mononitrate (IMDUR) 24 hr extended release tablet, 120 mg, Oral, QAM  labetalol (NORMODYNE) tablet, 300 mg, Oral, 2x/day  loratadine (CLARITIN) tablet, 10 mg, Oral, Daily  losartan (COZAAR) tablet, 100 mg, Oral, NIGHTLY  methylPREDNISolone sod succ (SOLU-MEDROL) 125 mg/2 mL injection, 62.5 mg, Intravenous, Q6H  midodrine (PROAMITINE) tablet, 5 mg, Oral, DIALYSIS DAILY PRN  montelukast (SINGULAIR) 10 mg tablet, 10 mg, Oral, QPM  morphine 2 mg/mL injection, 2 mg, Intravenous, Q8H PRN  nitroGLYCERIN (NITROSTAT) sublingual tablet, 0.4 mg, Sublingual, Q5 Min PRN  ondansetron (ZOFRAN) 2 mg/mL injection, 4 mg, Intravenous, Q8H PRN  pantoprazole (PROTONIX) delayed release tablet, 40 mg, Oral, Daily  spironolactone (ALDACTONE) tablet, 50 mg, Oral, 2X/day          Physical Exam:  General:  Not in any acute distress but appears dyspneic and she is on oxygen  Cardiac: Regular rate and rhythm, no murmur auscultated.  Respiratory: Clear to auscultation bilaterally without wheeze.  Abdomen: Positive bowel sounds, soft, nontender  Extremities: No peripheral edema noted on exam.    Vitals:  Temperature: 36.6 C (97.9 F)  Heart Rate: 74  Respiratory Rate: 18  BP (Non-Invasive): (!) 143/98  SpO2: 90 %    Nursing note and vitals reviewed.     Labs:     Results for orders placed or performed during the hospital encounter of 10/15/21 (from the past 24 hour(s))   COMPREHENSIVE METABOLIC PANEL,  NON-FASTING   Result Value Ref Range    SODIUM 130 (L) 136 - 145 mmol/L    POTASSIUM 4.6 3.5 - 5.1 mmol/L    CHLORIDE 91 (L) 98 - 107 mmol/L    CO2 TOTAL 25 21 - 31 mmol/L  ANION GAP 14 (H) 4 - 13 mmol/L    BUN 44 (H) 7 - 25 mg/dL    CREATININE 10.82 (H) 0.60 - 1.30 mg/dL    BUN/CREA RATIO 4 (L) 6 - 22    ESTIMATED GFR 4 (L) >59 mL/min/1.67m^2    ALBUMIN 4.1 3.5 - 5.7 g/dL    CALCIUM 9.6 8.6 - 10.3 mg/dL    GLUCOSE 183 (H) 74 - 109 mg/dL    ALKALINE PHOSPHATASE 68 34 - 104 U/L    ALT (SGPT) <7 (L) 7 - 52 U/L    AST (SGOT) 12 (L) 13 - 39 U/L    BILIRUBIN TOTAL 0.9 0.3 - 1.2 mg/dL    PROTEIN TOTAL 7.8 6.4 - 8.9 g/dL    ALBUMIN/GLOBULIN RATIO 1.1 0.8 - 1.4    OSMOLALITY, CALCULATED 277 270 - 290 mOsm/kg    CALCIUM, CORRECTED 9.5 8.9 - 10.8 mg/dL    GLOBULIN 3.7 2.9 - 5.4   CBC   Result Value Ref Range    WBCS UNCORRECTED 5.4 x10^3/uL    WBC 5.4 4.0 - 10.5 x10^3/uL    RBC 2.81 (L) 4.20 - 5.40 x10^6/uL    HGB 8.2 (L) 12.5 - 16.0 g/dL    HCT 23.8 (L) 37.0 - 47.0 %    MCV 84.8 78.0 - 99.0 fL    MCH 29.2 27.0 - 32.0 pg    MCHC 34.4 32.0 - 36.0 g/dL    RDW 21.1 (H) 11.6 - 14.8 %    PLATELETS 275 140 - 440 x10^3/uL    MPV 6.9 (L) 7.4 - 10.4 fL   PHOSPHORUS   Result Value Ref Range    PHOSPHORUS 6.5 3.7 - 7.2 mg/dL       Diagnostic Tests   Reviewed:     Most Recent EKG This Encounter   ECG 12 LEAD    Collection Time: 10/16/21  2:30 AM   Result Value    Ventricular rate 91    Atrial Rate 89    PR Interval 182    QRS Duration 100    QT Interval 384    QTC Calculation 472    Calculated P Axis 90    Calculated R Axis 23    Calculated T Axis 103    Narrative    Normal sinus rhythm  Nonspecific ST and T wave abnormality  Prolonged QT  Abnormal ECG  Confirmed by Anderson Malta [id number]) on 10/16/2021 5:21:51 PM     No results found for this or any previous visit.   @LASTECHORESULTS @   @LASTECHO @     Active Hospital Problems    Diagnosis    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)    H/O heart artery stent    Hypertensive urgency     Acute respiratory failure with hypoxia (CMS HCC)    Fluid overload    Anemia in chronic kidney disease    End-stage renal disease on hemodialysis (CMS HCC)        Assessment:   1. Chest pain with coronary disease documented by cardiac catheterization August of 2023.  She had complete occlusion of the right coronary artery.  65-75% LAD which was heavily calcified and also 70-90% circumflex.    2. End-stage renal disease on hemodialysis    3. Accelerated hypertension    4. Heart failure with preserved ejection fraction     5. COPD with history of sleep apnea     Plan:   We will continue with the  medical therapy including antiplatelet agents beta-blocker statin and long-acting nitrates.    Thank you very much for asking me to take part in the management    Hewitt Shorts, MD       This note was partially generated using MModal Fluency Direct system, and there may be some incorrect words, spellings, and punctuation that were not noted in checking the note before saving.

## 2021-10-17 NOTE — Progress Notes (Signed)
[facility]  IP PROGRESS NOTE      [name], [name]  Date of Admission:  10/15/2021  Date of Birth:  [birth year]  Date of Service:  10/17/2021    Hospital Day:  LOS: 2 days     Subjective:   Patient seen examined for follow-up of acute respiratory failure with hypoxia, COPD exacerbation, fluid overload, end-stage renal disease on hemodialysis, coronary artery disease with recent PCI on 08/15 and stent placement.  Patient reports that her shortness of breath has improved since admission.    Vital Signs:  Temp (24hrs) Max:37.1 C (98.8 F)      Temperature: 36.5 C (97.7 F)  BP (Non-Invasive): 134/85  MAP (Non-Invasive): 84 mmHG  Heart Rate: 71  Respiratory Rate: 16  SpO2: 90 %    Current Medications:  acetaminophen (TYLENOL) tablet, 650 mg, Oral, Q6H PRN  acetaminophen (TYLENOL) tablet, 650 mg, Oral, Q4H PRN  albuterol (PROVENTIL) 2.5 mg / 3 mL (0.083%) neb solution, 2.5 mg, Nebulization, 4x/day  alcohol 62 % (NOZIN NASAL SANITIZER) nasal solution, 1 Each, Each Nostril, 2x/day  amLODIPine (NORVASC) tablet, 10 mg, Oral, Daily  apixaban (ELIQUIS) tablet, 2.5 mg, Oral, 2x/day  aspirin chewable tablet 81 mg, 81 mg, Oral, Daily  atorvastatin (LIPITOR) tablet, 40 mg, Oral, QPM  budesonide (PULMICORT RESPULES) 0.5 mg/2 mL nebulizer suspension, 0.5 mg, Nebulization, 2x/day  calcitriol (ROCALTROL) capsule, 1 mcg, Oral, M, W, and F  cinacalcet (SENSIPAR) tablet, 30 mg, Oral, QPM  cloNIDine (CATAPRES) tablet, 0.2 mg, Oral, 2x/day  cloNIDine (CATAPRES-TTS) transdermal patch (mg/24 hr), 0.3 mg, Transdermal, Q7 Days  clopidogrel (PLAVIX) 75 mg tablet, 75 mg, Oral, Daily  cyanocobalamin (VITAMIN B12) tablet, 1,000 mcg, Oral, Daily with Lunch  cyclobenzaprine (FLEXERIL) tablet, 5 mg, Oral, 2x/day PRN  diphenhydrAMINE (BENADRYL) capsule, 50 mg, Oral, Q6H PRN  doxazosin (CARDURA) tablet, 4 mg, Oral, QPM  [START ON 10/18/2021] epoetin alfa-epbx (RETACRIT) 10,000 units/mL  injection, 10,000 Units, Intravenous, Every MO, WE, and FR Give in Dialysis  epoetin alfa-epbx (RETACRIT) 10,000 units/mL injection, 10,000 Units, Intravenous, Once  famotidine (PEPCID) tablet, 40 mg, Oral, Daily  fluconazole (DIFLUCAN) tablet, 200 mg, Oral, Daily  folic acid (FOLVITE) tablet, 1 mg, Oral, Daily  furosemide (LASIX) tablet, 160 mg, Oral, Daily  guaiFENesin (MUCINEX) extended release tablet - for cough (expectorant), 600 mg, Oral, Q12H  hydrALAZINE (APRESOLINE) injection 10 mg, 10 mg, Intravenous, Q6H PRN  hydrALAZINE (APRESOLINE) tablet, 100 mg, Oral, 2x/day  HYDROmorphone (DILAUDID) 2 mg/mL injection, 1 mg, Intravenous, Q4H PRN  ipratropium-albuterol 0.5 mg-3 mg(2.5 mg base)/3 mL Solution for Nebulization, 3 mL, Nebulization, 4x/day PRN  isosorbide mononitrate (IMDUR) 24 hr extended release tablet, 120 mg, Oral, QAM  labetalol (NORMODYNE) tablet, 300 mg, Oral, 2x/day  loratadine (CLARITIN) tablet, 10 mg, Oral, Daily  losartan (COZAAR) tablet, 100 mg, Oral, NIGHTLY  methylPREDNISolone sod succ (SOLU-MEDROL) 125 mg/2 mL injection, 62.5 mg, Intravenous, Q6H  midodrine (PROAMITINE) tablet, 5 mg, Oral, DIALYSIS DAILY PRN  montelukast (SINGULAIR) 10 mg tablet, 10 mg, Oral, QPM  morphine 2 mg/mL injection, 2 mg, Intravenous, Q8H PRN  nitroGLYCERIN (NITROSTAT) sublingual tablet, 0.4 mg, Sublingual, Q5 Min PRN  ondansetron (ZOFRAN) 2 mg/mL injection, 4 mg, Intravenous, Q8H PRN  pantoprazole (PROTONIX) delayed release tablet, 40 mg, Oral, Daily  spironolactone (ALDACTONE) tablet, 50 mg, Oral, 2X/day        Current Orders:  Active Orders   Imaging    MRI SPINE LUMBOSACRAL WO CONTRAST     Frequency: ONE  TIME     Number of Occurrences: 1 Occurrences     Scheduling Instructions:               Lab    CBC     Frequency: ONE TIME     Number of Occurrences: 1 Occurrences    COMPREHENSIVE METABOLIC PANEL, NON-FASTING     Frequency: ONE TIME     Number of Occurrences: 1 Occurrences    MAGNESIUM     Frequency: ONE TIME      Number of Occurrences: 1 Occurrences   Diet    DIET RENAL Do you want to initiate MNT Protocol? Yes     Frequency: All Meals     Number of Occurrences: 1 Occurrences   Nursing    ACTIVITY Activity: AS TOLERATED; Instructions: WITH ASSIST     Frequency: UNTIL DISCONTINUED     Number of Occurrences: Until Specified    APIXABAN NURSING ORDER     Frequency: UNTIL DISCONTINUED     Number of Occurrences: Until Specified     Order Comments: Nursing Instructions:    1.  Print apixaban (Eliquis) guide using the link in this order.   2.  Nurse to provide apixaban patient guide to all patients and be sure that all new starts have received education by the trained anticoagulation educator.  If additional questions, contact pharmacy.           CONTINUOUS CARDIAC MONITORING (ED USE ONLY)     Frequency: CONTINUOUS     Number of Occurrences: Until Specified    NOTIFY MD     Frequency: PRN     Number of Occurrences: Until Specified    Notify MD Vital Signs     Frequency: PRN     Number of Occurrences: Until Specified    PT IS MEDIUM RISK FOR VENOUS THROMBOEMBOLISM     Frequency: CONTINUOUS     Number of Occurrences: Until Specified    PULSE OXIMETRY CONTINUOUS     Frequency: CONTINUOUS     Number of Occurrences: Until Specified    PULSE OXIMETRY Q4H     Frequency: Q4H     Number of Occurrences: Until Specified    TELEMETRY MONITORING - Continuous     Frequency: CONTINUOUS     Number of Occurrences: Until Specified    VITAL SIGNS  Q4H     Frequency: Q4H     Number of Occurrences: Until Specified    VITAL SIGNS MULTI FREQUENCY     Frequency: UNTIL DISCONTINUED     Number of Occurrences: Until Specified    WAS PATIENT ON APIXABAN PRIOR TO ADMISSION?     Frequency: UNTIL DISCONTINUED     Number of Occurrences: Until Specified    WEIGH PATIENT PRE AND POST DIALYSIS TREATMENT     Frequency: EVERY OTHER DAY     Number of Occurrences: Until Specified    WEIGH PATIENT PRE AND POST DIALYSIS TREATMENT     Frequency: EVERY OTHER DAY      Number of Occurrences: Until Specified   Code Status    FULL CODE     Frequency: CONTINUOUS     Number of Occurrences: Until Specified   Consult    IP CONSULT TO CARDIOLOGY Requested Provider; Anderson Malta A     Frequency: ONE TIME     Number of Occurrences: 1 Occurrences    IP CONSULT TO NEPHROLOGY Requested Provider; ETER, AHMAD     Frequency: ONE TIME  Number of Occurrences: 1 Occurrences   IV    INSERT & MAINTAIN PERIPHERAL IV ACCESS     Frequency: UNTIL DISCONTINUED     Number of Occurrences: Until Specified   Dialysis    HEMODIALYSIS     Frequency: ONE TIME     Number of Occurrences: 1 Occurrences     Order Comments: Dialysis access, use CVC unless patient has AV fistula or AV graft  Heparin:  2000 units at the start of dialysis, as long as hemoglobin above 7, platelets above 5,0000 and No GI Bleed/active bleeding   UF Goal 1.0- 3.0 L  Contact me for any questions      HEMODIALYSIS     Frequency: ONE TIME     Number of Occurrences: 1 Occurrences     Order Comments: Dialysis access, use CVC unless patient has AV fistula or AV graft  Heparin:  2000 units at the start of dialysis, as long as hemoglobin above 7, platelets above 5,0000 and No GI Bleed/active bleeding   UF Goal 1.0- 3.0 L  Contact me for any questions     Medications    acetaminophen (TYLENOL) tablet     Frequency: Q6H PRN     Dose: 650 mg     Route: Oral    acetaminophen (TYLENOL) tablet     Frequency: Q4H PRN     Dose: 650 mg     Route: Oral    albuterol (PROVENTIL) 2.5 mg / 3 mL (0.083%) neb solution     Frequency: 4x/day     Dose: 2.5 mg     Route: Nebulization    alcohol 62 % (NOZIN NASAL SANITIZER) nasal solution     Frequency: 2x/day     Dose: 1 Each     Route: Each Nostril    amLODIPine (NORVASC) tablet     Frequency: Daily     Dose: 10 mg     Route: Oral    apixaban (ELIQUIS) tablet     Frequency: 2x/day     Dose: 2.5 mg     Route: Oral    aspirin chewable tablet 81 mg     Frequency: Daily     Dose: 81 mg     Route: Oral    atorvastatin  (LIPITOR) tablet     Frequency: QPM     Dose: 40 mg     Route: Oral    budesonide (PULMICORT RESPULES) 0.5 mg/2 mL nebulizer suspension     Frequency: 2x/day     Dose: 0.5 mg     Route: Nebulization    calcitriol (ROCALTROL) capsule     Frequency: M, W, and F     Dose: 1 mcg     Route: Oral    cinacalcet (SENSIPAR) tablet     Frequency: QPM     Dose: 30 mg     Route: Oral    cloNIDine (CATAPRES) tablet     Frequency: 2x/day     Dose: 0.2 mg     Route: Oral    cloNIDine (CATAPRES-TTS) transdermal patch (mg/24 hr)     Frequency: Q7 Days     Dose: 0.3 mg     Route: Transdermal    clopidogrel (PLAVIX) 75 mg tablet     Frequency: Daily     Dose: 75 mg     Route: Oral    cyanocobalamin (VITAMIN B12) tablet     Frequency: Daily with Lunch     Dose: 1,000 mcg  Route: Oral    cyclobenzaprine (FLEXERIL) tablet     Frequency: 2x/day PRN     Dose: 5 mg     Route: Oral    diphenhydrAMINE (BENADRYL) capsule     Frequency: Q6H PRN     Dose: 50 mg     Route: Oral    doxazosin (CARDURA) tablet     Frequency: QPM     Dose: 4 mg     Route: Oral    epoetin alfa-epbx (RETACRIT) 10,000 units/mL injection     Frequency: Every MO, WE, and FR Give in Dialysis     Dose: 10,000 Units     Route: Intravenous    epoetin alfa-epbx (RETACRIT) 10,000 units/mL injection     Frequency: Once     Dose: 10,000 Units     Route: Intravenous    famotidine (PEPCID) tablet     Frequency: Daily     Dose: 40 mg     Route: Oral    fluconazole (DIFLUCAN) tablet     Frequency: Daily     Dose: 200 mg     Route: Oral    folic acid (FOLVITE) tablet     Frequency: Daily     Dose: 1 mg     Route: Oral    furosemide (LASIX) tablet     Frequency: Daily     Dose: 160 mg     Route: Oral    guaiFENesin (MUCINEX) extended release tablet - for cough (expectorant)     Frequency: Q12H     Dose: 600 mg     Route: Oral    hydrALAZINE (APRESOLINE) injection 10 mg     Frequency: Q6H PRN     Dose: 10 mg     Route: Intravenous    hydrALAZINE (APRESOLINE) tablet     Frequency:  2x/day     Dose: 100 mg     Route: Oral    HYDROmorphone (DILAUDID) 2 mg/mL injection     Frequency: Q4H PRN     Dose: 1 mg     Route: Intravenous    ipratropium-albuterol 0.5 mg-3 mg(2.5 mg base)/3 mL Solution for Nebulization     Frequency: 4x/day PRN     Dose: 3 mL     Route: Nebulization    isosorbide mononitrate (IMDUR) 24 hr extended release tablet     Frequency: QAM     Dose: 120 mg     Route: Oral    labetalol (NORMODYNE) tablet     Frequency: 2x/day     Dose: 300 mg     Route: Oral    loratadine (CLARITIN) tablet     Frequency: Daily     Dose: 10 mg     Route: Oral    losartan (COZAAR) tablet     Frequency: NIGHTLY     Dose: 100 mg     Route: Oral    methylPREDNISolone sod succ (SOLU-MEDROL) 125 mg/2 mL injection     Frequency: Q6H     Dose: 62.5 mg     Route: Intravenous    midodrine (PROAMITINE) tablet     Frequency: DIALYSIS DAILY PRN     Dose: 5 mg     Route: Oral     Order Comments: Administer at start of HD.  May repeat the dose after 30 min from start of HD    montelukast (SINGULAIR) 10 mg tablet     Frequency: QPM     Dose: 10 mg     Route:  Oral    morphine 2 mg/mL injection     Frequency: Q8H PRN     Dose: 2 mg     Route: Intravenous    nitroGLYCERIN (NITROSTAT) sublingual tablet     Frequency: Q5 Min PRN     Dose: 0.4 mg     Route: Sublingual    ondansetron (ZOFRAN) 2 mg/mL injection     Frequency: Q8H PRN     Dose: 4 mg     Route: Intravenous    pantoprazole (PROTONIX) delayed release tablet     Frequency: Daily     Dose: 40 mg     Route: Oral    spironolactone (ALDACTONE) tablet     Frequency: 2X/day     Dose: 50 mg     Route: Oral        Review of Systems:  Focused review of system was completed. Refer to the HPI for ROS details.     Today's Physical Exam:  Physical Exam  Constitutional:       Appearance: Normal appearance.   Cardiovascular:      Rate and Rhythm: Normal rate and regular rhythm.   Pulmonary:      Breath sounds: Wheezing present.   Abdominal:      General: Abdomen is flat.       Palpations: Abdomen is soft.   Skin:     General: Skin is warm and dry.   Neurological:      Mental Status: She is alert and oriented to person, place, and time.   Psychiatric:         Mood and Affect: Mood normal.      I/O:  I/O last 24 hours:  No intake or output data in the 24 hours ending 10/17/21 1618    I/O current shift:  No intake/output data recorded.    Labs  Please indicate ordered or reviewed)  Reviewed: I have reviewed all lab results.    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 Acute respiratory failure with hypoxia likely secondary to COPD exacerbation versus fluid overload:  Patient has been noncompliant with hemodialysis.  Continue with nebs, Solu-Medrol for COPD exacerbation.  Continue with supplemental oxygen titration.  Abx started.    2.  End-stage renal disease on hemodialysis:  Nephrology consulted    3. History of coronary artery disease with recent stent placement on 08/15:  Continue with aspirin and Plavix.  Per records from [facility] patient is a high risk candidate for CABG.    4.  Low back pain w/ abnormal imaging seen on CT scan 08/2021:  suspicious for osteomyelitis - low back MRI pending.    Carron Brazen, DO      DVT/PE Prophylaxis: Apixaban    Disposition Planning: Home discharge      Advance Care Planning Discussed:  No

## 2021-10-17 NOTE — Care Plan (Signed)
Pt breathing better today. Verbalized understanding for repeat dialysis. MRI complete, pt resting, no s/s of distress noted.  Problem: Adult Inpatient Plan of Care  Goal: Plan of Care Review  Outcome: Ongoing (see interventions/notes)  Goal: Patient-Specific Goal (Individualized)  Outcome: Ongoing (see interventions/notes)  Flowsheets (Taken 10/17/2021 0720)  Individualized Care Needs: O2 mgnt, breathing treatments  Anxieties, Fears or Concerns: Chest pain  Patient-Specific Goals (Include Timeframe): Remove excess fluid and lower BP  Plan of Care Reviewed With: patient  Goal: Absence of Hospital-Acquired Illness or Injury  Outcome: Ongoing (see interventions/notes)  Intervention: Prevent Skin Injury  Recent Flowsheet Documentation  Taken 10/17/2021 1600 by Camille Bal, RN  Body Position: positioned/repositioned independently  Taken 10/17/2021 1400 by Camille Bal, RN  Body Position: positioned/repositioned independently  Taken 10/17/2021 1200 by Camille Bal, RN  Body Position: positioned/repositioned independently  Taken 10/17/2021 1000 by Camille Bal, RN  Body Position: positioned/repositioned independently  Taken 10/17/2021 0800 by Camille Bal, RN  Body Position: positioned/repositioned independently  Taken 10/17/2021 0720 by Camille Bal, RN  Body Position: positioned/repositioned independently  Goal: Optimal Comfort and Wellbeing  Outcome: Ongoing (see interventions/notes)  Goal: Rounds/Family Conference  Outcome: Ongoing (see interventions/notes)     Problem: Fluid Volume Excess  Goal: Fluid Balance  Outcome: Ongoing (see interventions/notes)     Problem: Pain Acute  Goal: Optimal Pain Control and Function  Outcome: Ongoing (see interventions/notes)  Intervention: Optimize Psychosocial Wellbeing  Recent Flowsheet Documentation  Taken 10/17/2021 0720 by Camille Bal, RN  Diversional Activities:   smartphone   television     Problem: Gas Exchange Impaired  Goal: Optimal Gas Exchange  Outcome: Ongoing (see  interventions/notes)  Intervention: Optimize Oxygenation and Ventilation  Recent Flowsheet Documentation  Taken 10/17/2021 0720 by Camille Bal, RN  Head of Bed [facility]) Positioning: HOB at 60-90 degrees     Problem: Skin Injury Risk Increased  Goal: Skin Health and Integrity  Outcome: Ongoing (see interventions/notes)  Intervention: Optimize Skin Protection  Recent Flowsheet Documentation  Taken 10/17/2021 0720 by Camille Bal, RN  Head of Bed [facility]) Positioning: HOB at 60-90 degrees

## 2021-10-17 NOTE — Consults (Signed)
[facility]    NEPHROLOGY CONSULTATION NOTE       [name] 46 y.o. female 318/A   Date of Service: 10/17/2021    Date of Admission:  10/15/2021   PCP: Cam Hai, FNP Code Status:Full Code       Reason for Consultation:  End-stage renal disease on hemodialysis MWF     HPI:   46 year old female with past medical history of end-stage renal disease on hemodialysis Mondays Wednesdays and Fridays missed dialysis on Monday due to issues with her oxygen delivery was delayed on Monday.    Patient reports today that she is short of breath she denies nausea or vomiting but reports decrease in appetite she also reports that her body all is hurting, she reported some bleeding from her nose.    Today 10/17/2021 patient was seen on dialysis, we are arranging for an extra treatment today.  Patient was initially upset about her heart condition and then she started calming down.  She was agreeable to proceed with dialysis.        ROS:   Systematic review of 12 organ systems was negative except what mentioned in in the HPI.        Current medications   acetaminophen (TYLENOL) tablet, 650 mg, Oral, Q6H PRN  acetaminophen (TYLENOL) tablet, 650 mg, Oral, Q4H PRN  albuterol (PROVENTIL) 2.5 mg / 3 mL (0.083%) neb solution, 2.5 mg, Nebulization, 4x/day  alcohol 62 % (NOZIN NASAL SANITIZER) nasal solution, 1 Each, Each Nostril, 2x/day  amLODIPine (NORVASC) tablet, 10 mg, Oral, Daily  apixaban (ELIQUIS) tablet, 2.5 mg, Oral, 2x/day  aspirin chewable tablet 81 mg, 81 mg, Oral, Daily  atorvastatin (LIPITOR) tablet, 40 mg, Oral, QPM  budesonide (PULMICORT RESPULES) 0.5 mg/2 mL nebulizer suspension, 0.5 mg, Nebulization, 2x/day  calcitriol (ROCALTROL) capsule, 1 mcg, Oral, M, W, and F  cinacalcet (SENSIPAR) tablet, 30 mg, Oral, QPM  cloNIDine (CATAPRES) tablet, 0.2 mg, Oral, 2x/day  cloNIDine (CATAPRES-TTS) transdermal patch (mg/24 hr), 0.3 mg, Transdermal, Q7 Days  clopidogrel (PLAVIX) 75 mg tablet, 75  mg, Oral, Daily  cyanocobalamin (VITAMIN B12) tablet, 1,000 mcg, Oral, Daily with Lunch  cyclobenzaprine (FLEXERIL) tablet, 5 mg, Oral, 2x/day PRN  diphenhydrAMINE (BENADRYL) capsule, 50 mg, Oral, Q6H PRN  doxazosin (CARDURA) tablet, 4 mg, Oral, QPM  [START ON 10/18/2021] epoetin alfa-epbx (RETACRIT) 10,000 units/mL injection, 10,000 Units, Intravenous, Every MO, WE, and FR Give in Dialysis  epoetin alfa-epbx (RETACRIT) 10,000 units/mL injection, 10,000 Units, Intravenous, Once  famotidine (PEPCID) tablet, 40 mg, Oral, Daily  fluconazole (DIFLUCAN) tablet, 200 mg, Oral, Daily  folic acid (FOLVITE) tablet, 1 mg, Oral, Daily  furosemide (LASIX) tablet, 160 mg, Oral, Daily  guaiFENesin (MUCINEX) extended release tablet - for cough (expectorant), 600 mg, Oral, Q12H  hydrALAZINE (APRESOLINE) injection 10 mg, 10 mg, Intravenous, Q6H PRN  hydrALAZINE (APRESOLINE) tablet, 100 mg, Oral, 2x/day  HYDROmorphone (DILAUDID) 2 mg/mL injection, 1 mg, Intravenous, Q4H PRN  ipratropium-albuterol 0.5 mg-3 mg(2.5 mg base)/3 mL Solution for Nebulization, 3 mL, Nebulization, 4x/day PRN  isosorbide mononitrate (IMDUR) 24 hr extended release tablet, 120 mg, Oral, QAM  labetalol (NORMODYNE) tablet, 300 mg, Oral, 2x/day  loratadine (CLARITIN) tablet, 10 mg, Oral, Daily  losartan (COZAAR) tablet, 100 mg, Oral, NIGHTLY  methylPREDNISolone sod succ (SOLU-MEDROL) 125 mg/2 mL injection, 62.5 mg, Intravenous, Q6H  midodrine (PROAMITINE) tablet, 5 mg, Oral, DIALYSIS DAILY PRN  montelukast (SINGULAIR) 10 mg tablet, 10 mg, Oral, QPM  morphine 2 mg/mL injection, 2 mg, Intravenous, Q8H  PRN  nitroGLYCERIN (NITROSTAT) sublingual tablet, 0.4 mg, Sublingual, Q5 Min PRN  ondansetron (ZOFRAN) 2 mg/mL injection, 4 mg, Intravenous, Q8H PRN  pantoprazole (PROTONIX) delayed release tablet, 40 mg, Oral, Daily  spironolactone (ALDACTONE) tablet, 50 mg, Oral, 2X/day            Physical:  Filed Vitals:    10/16/21 2218 10/17/21 0019 10/17/21 0822 10/17/21 0906   BP:   (!) 161/99 (!) 143/98    Pulse: 79 73 74    Resp:  20 18    Temp:  36.7 C (98.1 F) 36.6 C (97.9 F)    SpO2:  98% 97% 90%        Intake/Output Summary (Last 24 hours) at 10/17/2021 1157  Last data filed at 10/16/2021 1442  Gross per 24 hour   Intake 120 ml   Output --   Net 120 ml        Patient is alert awake and oriented not in acute distress.  Normal mood and affect.  HEENT normocephalic atraumatic.  Eye exam normal inspection.    Mucous membranes moist, no jaundice.  Neck exam no JVD normal inspection.  Cardiovascular system: Regular rate and rhythm no murmurs rubs or gallops. No chest wall tenderness  Lungs:  Breath sounds diminished bilaterally.  PermCath in place.  Abdomen soft nontender nondistended.  Extremities no clubbing cyanosis or edema   Neuro exam: EOMI, normal speech    Labs:  CBC:     5.4 (08/24 0353) \   8.2* (08/24 0353) /   275 (08/24 0353)      / 23.8* (08/24 0353) \          BMP:   130* (08/24 0353) 91* (08/24 0353) 44* (08/24 0353)    /     183* (08/24 0353)   4.6 (08/24 0353) 25 (08/24 4193) 10.82* (08/24 0353) \                 Diagnostic studies:  Imaging:   ECG 12 LEAD  Normal sinus rhythm  Nonspecific ST and T wave abnormality  Prolonged QT  Abnormal ECG  Confirmed by Anderson Malta ([id number]) on 10/16/2021 5:21:51 PM      Assessments:  Active Hospital Problems   (*Primary Problem)    Diagnosis    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)    H/O heart artery stent    Hypertensive urgency    Acute respiratory failure with hypoxia (CMS HCC)    Fluid overload    Anemia in chronic kidney disease    End-stage renal disease on hemodialysis (CMS HCC)       Plan:         End-stage renal disease on hemodialysis  -Continue dialysis 3 times a week on MWF  - extra session of dialysis 10/17/2021 as patient missed dialysis on Monday  -please refer to dialysis orders    Anemia  -Monitor  -Epogen  -if patient develops chest pain we will transfuse 1 unit of blood      Heart disease  -we will need  outpatient follow-up she may need open heart surgery continue current blood thinners.  -keep hemoglobin close to 10.    CKD MBD  -Phosphorus 6.5  -Auryxia 3 tab TID wm   -aim for Phos level 3.5-5.5  -Renal diet    Acid-base  -Stable  -Continue dialysis    Electrolytes  -Monitor  -See dialysis orders  -Replace as needed    Volume status  -  Fluid restriction  -Dialysis               MY ORDERS LAST 24 (24h ago, onward)       Start     Ordered    10/17/21 1100  epoetin alfa-epbx (RETACRIT) 10,000 units/mL injection  ONCE         10/17/21 0951    10/17/21 1000  HEMODIALYSIS  ONE TIME        Comments: Dialysis access, use CVC unless patient has AV fistula or AV graft  Heparin:  2000 units at the start of dialysis, as long as hemoglobin above 7, platelets above 5,0000 and No GI Bleed/active bleeding   UF Goal 1.0- 3.0 L  Contact me for any questions    10/17/21 0950    10/17/21 1000  WEIGH PATIENT PRE AND POST DIALYSIS TREATMENT  EVERY OTHER DAY         10/17/21 [place], MD, FASN, 10/17/2021, 11:57

## 2021-10-18 DIAGNOSIS — N2581 Secondary hyperparathyroidism of renal origin: Secondary | ICD-10-CM

## 2021-10-18 DIAGNOSIS — I120 Hypertensive chronic kidney disease with stage 5 chronic kidney disease or end stage renal disease: Secondary | ICD-10-CM

## 2021-10-18 DIAGNOSIS — D631 Anemia in chronic kidney disease: Secondary | ICD-10-CM

## 2021-10-18 LAB — CBC
HCT: 25 % — ABNORMAL LOW (ref 37.0–47.0)
HGB: 8.3 g/dL — ABNORMAL LOW (ref 12.5–16.0)
MCH: 28.4 pg (ref 27.0–32.0)
MCHC: 33.3 g/dL (ref 32.0–36.0)
MCV: 85.5 fL (ref 78.0–99.0)
MPV: 7.2 fL — ABNORMAL LOW (ref 7.4–10.4)
PLATELETS: 306 10*3/uL (ref 140–440)
RBC: 2.92 10*6/uL — ABNORMAL LOW (ref 4.20–5.40)
RDW: 20.4 % — ABNORMAL HIGH (ref 11.6–14.8)
WBC: 11.1 10*3/uL — ABNORMAL HIGH (ref 4.0–10.5)
WBCS UNCORRECTED: 11.1 10*3/uL

## 2021-10-18 LAB — COMPREHENSIVE METABOLIC PANEL, NON-FASTING
ALBUMIN/GLOBULIN RATIO: 1.1 (ref 0.8–1.4)
ALBUMIN: 4.1 g/dL (ref 3.5–5.7)
ALKALINE PHOSPHATASE: 65 U/L (ref 34–104)
ALT (SGPT): <7 U/L — ABNORMAL LOW (ref 7–52)
ANION GAP: 12 mmol/L (ref 4–13)
AST (SGOT): 10 U/L — ABNORMAL LOW (ref 13–39)
BILIRUBIN TOTAL: 0.7 mg/dL (ref 0.3–1.2)
BUN/CREA RATIO: 5 — ABNORMAL LOW (ref 6–22)
BUN: 27 mg/dL — ABNORMAL HIGH (ref 7–25)
CALCIUM, CORRECTED: 9.5 mg/dL (ref 8.9–10.8)
CALCIUM: 9.6 mg/dL (ref 8.6–10.3)
CHLORIDE: 93 mmol/L — ABNORMAL LOW (ref 98–107)
CO2 TOTAL: 29 mmol/L (ref 21–31)
CREATININE: 5.68 mg/dL — ABNORMAL HIGH (ref 0.60–1.30)
ESTIMATED GFR: 9 mL/min/{1.73_m2} — ABNORMAL LOW (ref >59)
GLOBULIN: 3.6 (ref 2.9–5.4)
GLUCOSE: 186 mg/dL — ABNORMAL HIGH (ref 74–109)
OSMOLALITY, CALCULATED: 278 mOsm/kg (ref 270–290)
POTASSIUM: 3.4 mmol/L — ABNORMAL LOW (ref 3.5–5.1)
PROTEIN TOTAL: 7.7 g/dL (ref 6.4–8.9)
SODIUM: 134 mmol/L — ABNORMAL LOW (ref 136–145)

## 2021-10-18 LAB — MAGNESIUM: MAGNESIUM: 2 mg/dL (ref 1.9–2.7)

## 2021-10-18 LAB — OCCULT BLOOD, STOOL (IFOB): IFOB: POSITIVE — AB

## 2021-10-18 NOTE — Consults (Signed)
[name]    NEPHROLOGY CONSULTATION NOTE       [name] 46 y.o. female 318/A   Date of Service: 10/18/2021    Date of Admission:  10/15/2021   PCP: Cam Hai, FNP Code Status:Full Code       Reason for Consultation:  End-stage renal disease on hemodialysis MWF     HPI:   46 year old female with past medical history of end-stage renal disease on hemodialysis Mondays Wednesdays and Fridays missed dialysis on Monday due to issues with her oxygen delivery was delayed on Monday.    Patient reports today that she is short of breath she denies nausea or vomiting but reports decrease in appetite she also reports that her body all is hurting, she reported some bleeding from her nose.    Today was seen on dialysis.  No chest pain or shortness of breath.      ROS:   Systematic review of 12 organ systems was negative except what mentioned in in the HPI.        Current medications   acetaminophen (TYLENOL) tablet, 650 mg, Oral, Q6H PRN  acetaminophen (TYLENOL) tablet, 650 mg, Oral, Q4H PRN  albuterol (PROVENTIL) 2.5 mg / 3 mL (0.083%) neb solution, 2.5 mg, Nebulization, 4x/day  alcohol 62 % (NOZIN NASAL SANITIZER) nasal solution, 1 Each, Each Nostril, 2x/day  amLODIPine (NORVASC) tablet, 10 mg, Oral, Daily  apixaban (ELIQUIS) tablet, 2.5 mg, Oral, 2x/day  aspirin chewable tablet 81 mg, 81 mg, Oral, Daily  atorvastatin (LIPITOR) tablet, 40 mg, Oral, QPM  azithromycin (ZITHROMAX) 500 mg in NS 250 mL IVPB, 500 mg, Intravenous, Q24H  budesonide (PULMICORT RESPULES) 0.5 mg/2 mL nebulizer suspension, 0.5 mg, Nebulization, 2x/day  calcitriol (ROCALTROL) capsule, 1 mcg, Oral, M, W, and F  cefTRIAXone (ROCEPHIN) 2 g in NS 50 mL IVPB minibag, 2 g, Intravenous, Q24H  cinacalcet (SENSIPAR) tablet, 30 mg, Oral, QPM  cloNIDine (CATAPRES) tablet, 0.2 mg, Oral, 2x/day  cloNIDine (CATAPRES-TTS) transdermal patch (mg/24 hr), 0.3 mg, Transdermal, Q7 Days  clopidogrel (PLAVIX) 75 mg tablet, 75 mg, Oral,  Daily  cyanocobalamin (VITAMIN B12) tablet, 1,000 mcg, Oral, Daily with Lunch  cyclobenzaprine (FLEXERIL) tablet, 5 mg, Oral, 2x/day PRN  diphenhydrAMINE (BENADRYL) capsule, 50 mg, Oral, Q6H PRN  doxazosin (CARDURA) tablet, 4 mg, Oral, QPM  epoetin alfa-epbx (RETACRIT) 10,000 units/mL injection, 10,000 Units, Intravenous, Every MO, WE, and FR Give in Dialysis  epoetin alfa-epbx (RETACRIT) 10,000 units/mL injection, 10,000 Units, Intravenous, Once  famotidine (PEPCID) tablet, 40 mg, Oral, Daily  fluconazole (DIFLUCAN) tablet, 200 mg, Oral, Daily  folic acid (FOLVITE) tablet, 1 mg, Oral, Daily  furosemide (LASIX) tablet, 160 mg, Oral, Daily  guaiFENesin (MUCINEX) extended release tablet - for cough (expectorant), 600 mg, Oral, Q12H  hydrALAZINE (APRESOLINE) injection 10 mg, 10 mg, Intravenous, Q6H PRN  hydrALAZINE (APRESOLINE) tablet, 100 mg, Oral, 2x/day  HYDROmorphone (DILAUDID) 2 mg/mL injection, 1 mg, Intravenous, Q4H PRN  hydrOXYzine pamoate (VISTARIL) capsule, 25 mg, Oral, 4x/day PRN  ipratropium-albuterol 0.5 mg-3 mg(2.5 mg base)/3 mL Solution for Nebulization, 3 mL, Nebulization, 4x/day PRN  isosorbide mononitrate (IMDUR) 24 hr extended release tablet, 120 mg, Oral, QAM  labetalol (NORMODYNE) tablet, 300 mg, Oral, 2x/day  loratadine (CLARITIN) tablet, 10 mg, Oral, Daily  losartan (COZAAR) tablet, 100 mg, Oral, NIGHTLY  methylPREDNISolone sod succ (SOLU-MEDROL) 125 mg/2 mL injection, 62.5 mg, Intravenous, Q6H  midodrine (PROAMITINE) tablet, 5 mg, Oral, DIALYSIS DAILY PRN  montelukast (SINGULAIR) 10 mg tablet, 10 mg, Oral, QPM  morphine 2 mg/mL injection, 2 mg, Intravenous, Q8H PRN  nitroGLYCERIN (NITROSTAT) sublingual tablet, 0.4 mg, Sublingual, Q5 Min PRN  ondansetron (ZOFRAN) 2 mg/mL injection, 4 mg, Intravenous, Q8H PRN  pantoprazole (PROTONIX) delayed release tablet, 40 mg, Oral, Daily  prochlorperazine (COMPAZINE) 5 mg/mL injection, 10 mg, Intravenous, Q6H PRN  spironolactone (ALDACTONE) tablet, 50 mg,  Oral, 2X/day            Physical:  Filed Vitals:    10/17/21 1900 10/17/21 2023 10/17/21 2117 10/18/21 0027   BP: 138/88 135/82  132/80   Pulse: 75 59 71 67   Resp: 18 16  13    Temp: 36.8 C (98.2 F) 36.4 C (97.6 F)  36.1 C (96.9 F)   SpO2: 94% 100%  98%        Intake/Output Summary (Last 24 hours) at 10/18/2021 1052  Last data filed at 10/17/2021 1758  Gross per 24 hour   Intake 110 ml   Output --   Net 110 ml        Patient is NAD HEENT normocephalic atraumatic.  PermCath in place  Cardiovascular system: Regular rate and rhythm no murmurs rubs or gallops. No chest wall tenderness  Lungs:  Breath sounds diminished bilaterally.   Abdomen soft nontender nondistended.  Extremities no clubbing cyanosis or edema   Neuro exam: EOMI, normal speech    Labs:  CBC:       \     /          /   \          BMP:            /               \                 Diagnostic studies:  Imaging:   ECG 12 LEAD  Sinus rhythm with 1st degree AV block  Minimal voltage criteria for LVH, may be normal variant ( [employer] product )  Prolonged QT  Abnormal ECG  Confirmed by Anderson Malta ([id number]) on 10/17/2021 10:39:50 PM  MRI SPINE LUMBOSACRAL WO CONTRAST  Narrative: [name]    RADIOLOGIST: Danne Baxter, MD    MRI SPINE LUMBOSACRAL WO CONTRAST performed on 10/17/2021 4:33 PM    CLINICAL HISTORY: ? L5-S1 osteomyelitis.  LOW BACK PAIN-ABNORMAL CT ABD L5-S1    TECHNIQUE:  Noncontrast lumbar spine MRI.    COMPARISON:  X-rays 09/15/2019    FINDINGS:   Vertebrae:  Lumbar vertebral body heights are preserved.   There is chronic endplate signal change without edema and this space at L5-S1 related to advanced degenerative disease. There is no imaging finding to indicate discitis osteomyelitis. The findings at L5-S1 abdomen present dating back to CT in 2021.    Alignment:  Normal.  No spondylolisthesis.    Conus Medullaris:  Normally positioned.    L1-2:  Unremarkable    L2-3:  Unremarkable    L3-4:  There is disc space narrowing and disc desiccation  without a significant disc bulge. There is no stenosis.    L4-5:  There is disc space narrowing disc desiccation with broad-based disc bulge and superimposed central protrusion. There is mild bilateral facet disease. There is no significant stenosis or any evidence of nerve root contact.    L5-S1:  Postsurgical changes from right hemilaminectomy are noted. There is extensive endplate signal change with disc space narrowing. There is no significant disc bulge or stenosis.  There is no evidence of nerve root contact.    Sacrum:  Visualized upper sacrum and SI joints are unremarkable.    Review of the soft tissues demonstrate multiple left renal cysts. There appears to be bilateral atrophy of the kidneys.  Impression: DEGENERATIVE CHANGES AT L5-S1. NO EVIDENCE OF DISCITIS OSTEOMYELITIS.    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  -keep hemoglobin close to 10.  -continue good management for blood pressure.    CKD MBD  -Phosphorus 6.5  -Auryxia 3 tab TID wm   -aim for Phos level 3.5-5.5  -Renal diet    Acid-base  -Stable  -Continue dialysis    Electrolytes  -Monitor  -See dialysis orders  -Replace as needed  -secondary hyperparathyroidism continue Sensipar    Volume status  -Fluid restriction  -Dialysis               MY ORDERS LAST 24 (24h ago,  onward)       Start     Ordered    10/18/21 0730  HEMODIALYSIS  ONE TIME         10/18/21 0729    10/18/21 0730  [facility] PATIENT PRE AND POST DIALYSIS TREATMENT  EVERY OTHER DAY         10/18/21 7001                                Beather Arbour, MD, FASN, 10/18/2021, 10:52

## 2021-10-18 NOTE — Care Management Notes (Signed)
[facility] Management Initial Evaluation    Patient Name: [name]  Date of Birth: November 15, 1975  Sex: female  Date/Time of Admission: 10/15/2021  4:58 PM  Room/Bed: 318/A  Payor: HUMANA MEDICARE / Plan: HU[employer]NA CHOICE PPO / Product Type: PPO /   Primary Care Providers:  Deel, Leona Carry, FNP, FNP (General)    Pharmacy Info:   Preferred [facility], [address] [place]    [place] [address]    Phone: [phone] Fax: [phone]    Hours: Not open 24 hours          Emergency Contact Info:   Extended Emergency Contact Information  Primary Emergency Contact: Garner Nash  Mobile Phone: [phone]  Relation: Sister    History:   [name] is a 46 y.o., female, admitted     Height/Weight: 170.2 cm (5\' 7" ) / 86.6 kg (191 lb)     LOS: 3 days   Admitting Diagnosis: Hypertensive urgency [I16.0]    Assessment:      10/18/21 1655   Assessment Details   Assessment Type Admission   Date of Care Management Update 10/18/21   Readmission   Is this a readmission? Yes   Is this a scheduled readmission? No   Insurance Information/Type   Insurance type Medicare   Employment/Financial   Patient has Prescription Coverage?  Yes        Name of Insurance Coverage for Medications MEDICARE AND MEDICAID   Financial Concerns none   Living Environment   Select an age group to open "lives with" row.  Adult   Lives With alone   Living Arrangements apartment   Able to Return to Prior Arrangements yes   Home Safety   Home Assessment: No Problems Identified   Home Accessibility no concerns   Custody and Legal Status   Do you have a court appointed guardian/conservator? No   Are you an emancipated minor? No   Custody Issues? No   Paternity Affidavit Requested? No   Care Management Plan   Discharge Planning Status initial meeting   Discharge plan discussed with: Patient   CM will evaluate for rehabilitation potential no   Patient choice offered to  patient/family no   Form for patient choice reviewed/signed and on chart no   Facility or [facility] [phone], PATIENT STATES SHE FIRED OTHER [facility].   Discharge Needs Assessment   Outpatient/Agency/Support Group Needs infusion therapy, outpatient   Equipment Needed After Discharge none   Discharge Facility/Level of Care Needs Home (Patient/Family Member/other)(code 1)   Transportation Available Medicaid transportation   Referral Information   Admission Type inpatient   Address Verified verified-no changes   Arrived From home or self-care   ADVANCE DIRECTIVES   Does the Patient have an Advance Directive? Yes, Patient Does Have Advance Directive for Healthcare Treatment   Document the Substance of the Advance Directive (Required) MPOA   Type of Advance Directive Completed Medical Power of Attorney   Copy of Advance Directives in Chart? Yes, Copy on Chart.(Specify in [place] Directive)   Name of [place]   Phone Number of [place] or Healthcare Surrogate [phone]   Patient Requests Assistance in Having Advance Directive Notarized. N/A         Discharge Plan:  Home (Patient/Family Member/other) (code 1)  CM SPOKE WITH THE PT REGARDING D/C GOALS. PT STATES THAT She WILL RETURN HOME. PT HAS DIALYSIS MWF AT [facility]. PT STATES THAT She HAS HH THROUGH A PRIVATE COMPANY: [facility].     The patient will continue to be evaluated for developing discharge needs.     Case Manager: Lindell Noe, [place]  Phone: [phone]

## 2021-10-18 NOTE — Progress Notes (Signed)
PROGRESS NOTE    Rain, Wilhide  Date of Admission:  10/15/2021  Date of Birth:  Apr 09, 1975  Date of Service:  10/18/2021    Hospital Day:  LOS: 3 days     Subjective:   Patient seen examined at bedside by medical team.  Information obtained from nursing report, EHR review, patient and/or family, if present.     See Assessment/Plan section regarding any significant events overnight.  All systems reviewed and are unremarkable except as noted in HPI and below.    Vital Signs:  Temp (24hrs) Max:36.8 C (98.2 F)      Temperature: 36.1 C (96.9 F)  BP (Non-Invasive): 132/80  MAP (Non-Invasive): 84 mmHG  Heart Rate: 64  Respiratory Rate: 13  SpO2: 98 %    Current Medications:  acetaminophen (TYLENOL) tablet, 650 mg, Oral, Q6H PRN  acetaminophen (TYLENOL) tablet, 650 mg, Oral, Q4H PRN  albuterol (PROVENTIL) 2.5 mg / 3 mL (0.083%) neb solution, 2.5 mg, Nebulization, 4x/day  alcohol 62 % (NOZIN NASAL SANITIZER) nasal solution, 1 Each, Each Nostril, 2x/day  amLODIPine (NORVASC) tablet, 10 mg, Oral, Daily  apixaban (ELIQUIS) tablet, 2.5 mg, Oral, 2x/day  aspirin chewable tablet 81 mg, 81 mg, Oral, Daily  atorvastatin (LIPITOR) tablet, 40 mg, Oral, QPM  azithromycin (ZITHROMAX) 500 mg in NS 250 mL IVPB, 500 mg, Intravenous, Q24H  budesonide (PULMICORT RESPULES) 0.5 mg/2 mL nebulizer suspension, 0.5 mg, Nebulization, 2x/day  calcitriol (ROCALTROL) capsule, 1 mcg, Oral, M, W, and F  cefTRIAXone (ROCEPHIN) 2 g in NS 50 mL IVPB minibag, 2 g, Intravenous, Q24H  cinacalcet (SENSIPAR) tablet, 30 mg, Oral, QPM  cloNIDine (CATAPRES) tablet, 0.2 mg, Oral, 2x/day  cloNIDine (CATAPRES-TTS) transdermal patch (mg/24 hr), 0.3 mg, Transdermal, Q7 Days  clopidogrel (PLAVIX) 75 mg tablet, 75 mg, Oral, Daily  cyanocobalamin (VITAMIN B12) tablet, 1,000 mcg, Oral, Daily with Lunch  cyclobenzaprine (FLEXERIL) tablet, 5 mg, Oral, 2x/day PRN  diphenhydrAMINE (BENADRYL) capsule, 50 mg, Oral, Q6H PRN  doxazosin (CARDURA) tablet, 4 mg, Oral,  QPM  epoetin alfa-epbx (RETACRIT) 10,000 units/mL injection, 10,000 Units, Intravenous, Every MO, WE, and FR Give in Dialysis  epoetin alfa-epbx (RETACRIT) 10,000 units/mL injection, 10,000 Units, Intravenous, Once  famotidine (PEPCID) tablet, 40 mg, Oral, Daily  fluconazole (DIFLUCAN) tablet, 200 mg, Oral, Daily  folic acid (FOLVITE) tablet, 1 mg, Oral, Daily  furosemide (LASIX) tablet, 160 mg, Oral, Daily  guaiFENesin (MUCINEX) extended release tablet - for cough (expectorant), 600 mg, Oral, Q12H  hydrALAZINE (APRESOLINE) injection 10 mg, 10 mg, Intravenous, Q6H PRN  hydrALAZINE (APRESOLINE) tablet, 100 mg, Oral, 2x/day  HYDROmorphone (DILAUDID) 2 mg/mL injection, 1 mg, Intravenous, Q4H PRN  hydrOXYzine pamoate (VISTARIL) capsule, 25 mg, Oral, 4x/day PRN  ipratropium-albuterol 0.5 mg-3 mg(2.5 mg base)/3 mL Solution for Nebulization, 3 mL, Nebulization, 4x/day PRN  isosorbide mononitrate (IMDUR) 24 hr extended release tablet, 120 mg, Oral, QAM  labetalol (NORMODYNE) tablet, 300 mg, Oral, 2x/day  loratadine (CLARITIN) tablet, 10 mg, Oral, Daily  losartan (COZAAR) tablet, 100 mg, Oral, NIGHTLY  methylPREDNISolone sod succ (SOLU-MEDROL) 125 mg/2 mL injection, 62.5 mg, Intravenous, Q6H  midodrine (PROAMITINE) tablet, 5 mg, Oral, DIALYSIS DAILY PRN  montelukast (SINGULAIR) 10 mg tablet, 10 mg, Oral, QPM  morphine 2 mg/mL injection, 2 mg, Intravenous, Q8H PRN  nitroGLYCERIN (NITROSTAT) sublingual tablet, 0.4 mg, Sublingual, Q5 Min PRN  ondansetron (ZOFRAN) 2 mg/mL injection, 4 mg, Intravenous, Q8H PRN  pantoprazole (PROTONIX) delayed release tablet, 40 mg, Oral, Daily  prochlorperazine (COMPAZINE) 5 mg/mL injection, 10 mg, Intravenous, Q6H  PRN  spironolactone (ALDACTONE) tablet, 50 mg, Oral, 2X/day        Current Orders:  Active Orders   Lab    CBC     Frequency: ONE TIME     Number of Occurrences: 1 Occurrences    COMPREHENSIVE METABOLIC PANEL, NON-FASTING     Frequency: ONE TIME     Number of Occurrences: 1 Occurrences     MAGNESIUM     Frequency: ONE TIME     Number of Occurrences: 1 Occurrences   Diet    DIET RENAL Do you want to initiate MNT Protocol? Yes     Frequency: All Meals     Number of Occurrences: 1 Occurrences   Nursing    ACTIVITY Activity: AS TOLERATED; Instructions: WITH ASSIST     Frequency: UNTIL DISCONTINUED     Number of Occurrences: Until Specified    APIXABAN NURSING ORDER     Frequency: UNTIL DISCONTINUED     Number of Occurrences: Until Specified     Order Comments: Nursing Instructions:    1.  Print apixaban (Eliquis) guide using the link in this order.   2.  Nurse to provide apixaban patient guide to all patients and be sure that all new starts have received education by the trained anticoagulation educator.  If additional questions, contact pharmacy.           CONTINUOUS CARDIAC MONITORING (ED USE ONLY)     Frequency: CONTINUOUS     Number of Occurrences: Until Specified    NOTIFY MD     Frequency: PRN     Number of Occurrences: Until Specified    Notify MD Vital Signs     Frequency: PRN     Number of Occurrences: Until Specified    PT IS MEDIUM RISK FOR VENOUS THROMBOEMBOLISM     Frequency: CONTINUOUS     Number of Occurrences: Until Specified    PULSE OXIMETRY CONTINUOUS     Frequency: CONTINUOUS     Number of Occurrences: Until Specified    PULSE OXIMETRY Q4H     Frequency: Q4H     Number of Occurrences: Until Specified    TELEMETRY MONITORING - Continuous     Frequency: CONTINUOUS     Number of Occurrences: Until Specified    VITAL SIGNS  Q4H     Frequency: Q4H     Number of Occurrences: Until Specified    VITAL SIGNS MULTI FREQUENCY     Frequency: UNTIL DISCONTINUED     Number of Occurrences: Until Specified    WAS PATIENT ON APIXABAN PRIOR TO ADMISSION?     Frequency: UNTIL DISCONTINUED     Number of Occurrences: Until Specified    WEIGH PATIENT PRE AND POST DIALYSIS TREATMENT     Frequency: EVERY OTHER DAY     Number of Occurrences: Until Specified    WEIGH PATIENT PRE AND POST DIALYSIS TREATMENT      Frequency: EVERY OTHER DAY     Number of Occurrences: Until Specified    WEIGH PATIENT PRE AND POST DIALYSIS TREATMENT     Frequency: EVERY OTHER DAY     Number of Occurrences: Until Specified   Code Status    FULL CODE     Frequency: CONTINUOUS     Number of Occurrences: Until Specified   Consult    IP CONSULT TO CARDIOLOGY Requested Provider; Anderson Malta A     Frequency: ONE TIME     Number of Occurrences: 1 Occurrences  IP CONSULT TO NEPHROLOGY Requested Provider; ETER, AHMAD     Frequency: ONE TIME     Number of Occurrences: 1 Occurrences   IV    INSERT & MAINTAIN PERIPHERAL IV ACCESS     Frequency: UNTIL DISCONTINUED     Number of Occurrences: Until Specified   Dialysis    HEMODIALYSIS     Frequency: ONE TIME     Number of Occurrences: 1 Occurrences     Order Comments: Dialysis access, use CVC unless patient has AV fistula or AV graft  Heparin:  2000 units at the start of dialysis, as long as hemoglobin above 7, platelets above 5,0000 and No GI Bleed/active bleeding   UF Goal 1.0- 3.0 L  Contact me for any questions      HEMODIALYSIS     Frequency: ONE TIME     Number of Occurrences: 1 Occurrences     Order Comments: Dialysis access, use CVC unless patient has AV fistula or AV graft  Heparin:  2000 units at the start of dialysis, as long as hemoglobin above 7, platelets above 5,0000 and No GI Bleed/active bleeding   UF Goal 1.0- 3.0 L  Contact me for any questions      HEMODIALYSIS     Frequency: ONE TIME     Number of Occurrences: 1 Occurrences   Medications    acetaminophen (TYLENOL) tablet     Frequency: Q6H PRN     Dose: 650 mg     Route: Oral    acetaminophen (TYLENOL) tablet     Frequency: Q4H PRN     Dose: 650 mg     Route: Oral    albuterol (PROVENTIL) 2.5 mg / 3 mL (0.083%) neb solution     Frequency: 4x/day     Dose: 2.5 mg     Route: Nebulization    alcohol 62 % (NOZIN NASAL SANITIZER) nasal solution     Frequency: 2x/day     Dose: 1 Each     Route: Each Nostril    amLODIPine (NORVASC) tablet      Frequency: Daily     Dose: 10 mg     Route: Oral    apixaban (ELIQUIS) tablet     Frequency: 2x/day     Dose: 2.5 mg     Route: Oral    aspirin chewable tablet 81 mg     Frequency: Daily     Dose: 81 mg     Route: Oral    atorvastatin (LIPITOR) tablet     Frequency: QPM     Dose: 40 mg     Route: Oral    azithromycin (ZITHROMAX) 500 mg in NS 250 mL IVPB     Frequency: Q24H     Dose: 500 mg     Route: Intravenous    budesonide (PULMICORT RESPULES) 0.5 mg/2 mL nebulizer suspension     Frequency: 2x/day     Dose: 0.5 mg     Route: Nebulization    calcitriol (ROCALTROL) capsule     Frequency: M, W, and F     Dose: 1 mcg     Route: Oral    cefTRIAXone (ROCEPHIN) 2 g in NS 50 mL IVPB minibag     Frequency: Q24H     Dose: 2 g     Route: Intravenous    cinacalcet (SENSIPAR) tablet     Frequency: QPM     Dose: 30 mg     Route: Oral    cloNIDine (  CATAPRES) tablet     Frequency: 2x/day     Dose: 0.2 mg     Route: Oral    cloNIDine (CATAPRES-TTS) transdermal patch (mg/24 hr)     Frequency: Q7 Days     Dose: 0.3 mg     Route: Transdermal    clopidogrel (PLAVIX) 75 mg tablet     Frequency: Daily     Dose: 75 mg     Route: Oral    cyanocobalamin (VITAMIN B12) tablet     Frequency: Daily with Lunch     Dose: 1,000 mcg     Route: Oral    cyclobenzaprine (FLEXERIL) tablet     Frequency: 2x/day PRN     Dose: 5 mg     Route: Oral    diphenhydrAMINE (BENADRYL) capsule     Frequency: Q6H PRN     Dose: 50 mg     Route: Oral    doxazosin (CARDURA) tablet     Frequency: QPM     Dose: 4 mg     Route: Oral    epoetin alfa-epbx (RETACRIT) 10,000 units/mL injection     Frequency: Every MO, WE, and FR Give in Dialysis     Dose: 10,000 Units     Route: Intravenous    epoetin alfa-epbx (RETACRIT) 10,000 units/mL injection     Frequency: Once     Dose: 10,000 Units     Route: Intravenous    famotidine (PEPCID) tablet     Frequency: Daily     Dose: 40 mg     Route: Oral    fluconazole (DIFLUCAN) tablet     Frequency: Daily     Dose: 200 mg      Route: Oral    folic acid (FOLVITE) tablet     Frequency: Daily     Dose: 1 mg     Route: Oral    furosemide (LASIX) tablet     Frequency: Daily     Dose: 160 mg     Route: Oral    guaiFENesin (MUCINEX) extended release tablet - for cough (expectorant)     Frequency: Q12H     Dose: 600 mg     Route: Oral    hydrALAZINE (APRESOLINE) injection 10 mg     Frequency: Q6H PRN     Dose: 10 mg     Route: Intravenous    hydrALAZINE (APRESOLINE) tablet     Frequency: 2x/day     Dose: 100 mg     Route: Oral    HYDROmorphone (DILAUDID) 2 mg/mL injection     Frequency: Q4H PRN     Dose: 1 mg     Route: Intravenous    hydrOXYzine pamoate (VISTARIL) capsule     Frequency: 4x/day PRN     Dose: 25 mg     Route: Oral    ipratropium-albuterol 0.5 mg-3 mg(2.5 mg base)/3 mL Solution for Nebulization     Frequency: 4x/day PRN     Dose: 3 mL     Route: Nebulization    isosorbide mononitrate (IMDUR) 24 hr extended release tablet     Frequency: QAM     Dose: 120 mg     Route: Oral    labetalol (NORMODYNE) tablet     Frequency: 2x/day     Dose: 300 mg     Route: Oral    loratadine (CLARITIN) tablet     Frequency: Daily     Dose: 10 mg     Route: Oral  losartan (COZAAR) tablet     Frequency: NIGHTLY     Dose: 100 mg     Route: Oral    methylPREDNISolone sod succ (SOLU-MEDROL) 125 mg/2 mL injection     Frequency: Q6H     Dose: 62.5 mg     Route: Intravenous    midodrine (PROAMITINE) tablet     Frequency: DIALYSIS DAILY PRN     Dose: 5 mg     Route: Oral     Order Comments: Administer at start of HD.  May repeat the dose after 30 min from start of HD    montelukast (SINGULAIR) 10 mg tablet     Frequency: QPM     Dose: 10 mg     Route: Oral    morphine 2 mg/mL injection     Frequency: Q8H PRN     Dose: 2 mg     Route: Intravenous    nitroGLYCERIN (NITROSTAT) sublingual tablet     Frequency: Q5 Min PRN     Dose: 0.4 mg     Route: Sublingual    ondansetron (ZOFRAN) 2 mg/mL injection     Frequency: Q8H PRN     Dose: 4 mg     Route: Intravenous     pantoprazole (PROTONIX) delayed release tablet     Frequency: Daily     Dose: 40 mg     Route: Oral    prochlorperazine (COMPAZINE) 5 mg/mL injection     Frequency: Q6H PRN     Dose: 10 mg     Route: Intravenous    spironolactone (ALDACTONE) tablet     Frequency: 2X/day     Dose: 50 mg     Route: Oral        Today's Exam     -GENERAL: No acute distress, Resting comfortably   -SKIN: warm and dry.  -CARDIAC: RRR, no MRGs  -LUNGS: Clear to auscultation bilaterally. No wheezes or rhonchi.  -EXTREMITIES: No pedal edema noted. Distal pulses equal bilaterally.  -NEURO: Cranial nerves grossly intact. No gross focal deficit.     I/O:  I/O last 24 hours:    Intake/Output Summary (Last 24 hours) at 10/18/2021 1147  Last data filed at 10/17/2021 1758  Gross per 24 hour   Intake 110 ml   Output --   Net 110 ml     I/O current shift:  No intake/output data recorded.    Labs:  CBC:       \     /          /   \          BMP:            /               \              No results found for this or any previous visit (from the past 24 hour(s)).     Radiology:  No results found.     The laboratory results, imaging results and other diagnostic results were reviewed in the EMR.    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    -10/18/21: pt seen examined at bedside. Pt reported having a chest pain that she reported as "typical for me after dialysis", which was improved with pain meds, no longer having issue.     1. Acute respiratory failure with hypoxia likely secondary to COPD exacerbation versus fluid overload:  Patient has been noncompliant with  hemodialysis.  Continue with nebs, Solu-Medrol for COPD exacerbation.  Continue with supplemental oxygen titration.  Abx started on 10/17/21.     2.  End-stage renal disease on hemodialysis:  Nephrology consulted     3. History of coronary artery disease with recent stent placement on 08/15:  Continue with aspirin and Plavix.  Per records from [facility] patient is a high risk candidate for CABG.     4.  Low back pain w/ abnormal imaging seen on CT scan 08/2021:  suspicious for osteomyelitis - low back MRI was negative    DVT/PE Prophylaxis: Apixaban    Disposition Planning: Depending on clinical course.     The total amount of time spent on this encounter including review of historical information, labs, direct face-to-face time, discussion of care plan, care coordination and documentation was > 35 minutes.    Gar Ponto, DO  [place] [facility]    Contents of the document, in whole or in part, are completed utilizing M*Modal dictation technology, please forgive any typographical errors that may exist.

## 2021-10-19 LAB — CBC WITH DIFF
BASOPHIL #: 0 10*3/uL (ref 0.00–0.30)
BASOPHIL %: 0 % (ref 0–3)
EOSINOPHIL #: 0 10*3/uL (ref 0.00–0.80)
EOSINOPHIL %: 0 % (ref 0–7)
HCT: 26.5 % — ABNORMAL LOW (ref 37.0–47.0)
HGB: 8.7 g/dL — ABNORMAL LOW (ref 12.5–16.0)
LYMPHOCYTE #: 0.4 10*3/uL — ABNORMAL LOW (ref 1.10–5.00)
LYMPHOCYTE %: 3 % — ABNORMAL LOW (ref 25–45)
MCH: 28.2 pg (ref 27.0–32.0)
MCHC: 32.8 g/dL (ref 32.0–36.0)
MCV: 86.1 fL (ref 78.0–99.0)
MONOCYTE #: 0.4 10*3/uL (ref 0.00–1.30)
MONOCYTE %: 3 % (ref 0–12)
MPV: 7.4 fL (ref 7.4–10.4)
NEUTROPHIL #: 11.8 10*3/uL — ABNORMAL HIGH (ref 1.80–8.40)
NEUTROPHIL %: 94 % — ABNORMAL HIGH (ref 40–76)
PLATELET COMMENT: ADEQUATE
PLATELETS: 351 10*3/uL (ref 140–440)
RBC: 3.08 10*6/uL — ABNORMAL LOW (ref 4.20–5.40)
RDW: 20.8 % — ABNORMAL HIGH (ref 11.6–14.8)
WBC: 12.6 10*3/uL — ABNORMAL HIGH (ref 4.0–10.5)
WBCS UNCORRECTED: 12.6 10*3/uL

## 2021-10-19 LAB — BASIC METABOLIC PANEL
ANION GAP: 14 mmol/L — ABNORMAL HIGH (ref 4–13)
BUN/CREA RATIO: 6 (ref 6–22)
BUN: 44 mg/dL — ABNORMAL HIGH (ref 7–25)
CALCIUM: 9 mg/dL (ref 8.6–10.3)
CHLORIDE: 94 mmol/L — ABNORMAL LOW (ref 98–107)
CO2 TOTAL: 26 mmol/L (ref 21–31)
CREATININE: 7.01 mg/dL — ABNORMAL HIGH (ref 0.60–1.30)
ESTIMATED GFR: 7 mL/min/{1.73_m2} — ABNORMAL LOW (ref >59)
GLUCOSE: 118 mg/dL — ABNORMAL HIGH (ref 74–109)
OSMOLALITY, CALCULATED: 281 mOsm/kg (ref 270–290)
POTASSIUM: 4.6 mmol/L (ref 3.5–5.1)
SODIUM: 134 mmol/L — ABNORMAL LOW (ref 136–145)

## 2021-10-19 LAB — ECG 12 LEAD
Atrial Rate: 65 {beats}/min
Calculated P Axis: 63 degrees
Calculated R Axis: 20 degrees
Calculated T Axis: 73 degrees
PR Interval: 216 ms
QRS Duration: 110 ms
QT Interval: 488 ms
QTC Calculation: 507 ms
Ventricular rate: 65 {beats}/min

## 2021-10-19 LAB — MAGNESIUM: MAGNESIUM: 2 mg/dL (ref 1.9–2.7)

## 2021-10-19 MED ORDER — CALCIUM ACETATE(PHOSPHATE BINDERS) 667 MG CAPSULE
1334.0000 mg | ORAL_CAPSULE | Freq: Three times a day (TID) | ORAL | Status: DC
Start: 2021-10-19 — End: 2021-10-21
  Administered 2021-10-19 – 2021-10-21 (×6): 1334 mg via ORAL
  Filled 2021-10-19 (×6): qty 2

## 2021-10-19 MED ORDER — FERRIC CITRATE 210 MG IRON TABLET
630.0000 mg | ORAL_TABLET | Freq: Three times a day (TID) | ORAL | Status: DC
Start: 2021-10-19 — End: 2021-10-19
  Administered 2021-10-19: 0 mg via ORAL

## 2021-10-19 MED ORDER — DOCUSATE SODIUM 100 MG CAPSULE
100.0000 mg | ORAL_CAPSULE | Freq: Two times a day (BID) | ORAL | Status: DC
Start: 2021-10-19 — End: 2021-10-21
  Administered 2021-10-19 – 2021-10-21 (×4): 100 mg via ORAL
  Filled 2021-10-19 (×4): qty 1

## 2021-10-19 MED ORDER — FLUCONAZOLE 200 MG TABLET
200.0000 mg | ORAL_TABLET | Freq: Once | ORAL | Status: DC
Start: 2021-10-19 — End: 2021-10-21
  Administered 2021-10-19: 0 mg via ORAL
  Filled 2021-10-19: qty 1

## 2021-10-19 MED ORDER — BISACODYL 5 MG TABLET,DELAYED RELEASE
10.0000 mg | DELAYED_RELEASE_TABLET | Freq: Once | ORAL | Status: AC
Start: 2021-10-19 — End: 2021-10-19
  Administered 2021-10-19: 10 mg via ORAL
  Filled 2021-10-19: qty 2

## 2021-10-19 MED ORDER — HYDRALAZINE 50 MG TABLET
100.0000 mg | ORAL_TABLET | Freq: Three times a day (TID) | ORAL | Status: DC
Start: 2021-10-19 — End: 2021-10-21
  Administered 2021-10-19 (×2): 0 mg via ORAL
  Administered 2021-10-20: 100 mg via ORAL
  Administered 2021-10-20: 0 mg via ORAL
  Administered 2021-10-20: 100 mg via ORAL
  Administered 2021-10-21: 0 mg via ORAL
  Administered 2021-10-21: 100 mg via ORAL
  Filled 2021-10-19 (×6): qty 2

## 2021-10-19 NOTE — Progress Notes (Signed)
PROGRESS NOTE    [name], [name]  Date of Admission:  10/15/2021  Date of Birth:  Sep 15, 1975  Date of Service:  10/19/2021    Hospital Day:  LOS: 4 days     Subjective:   Patient seen examined at bedside by medical team.  Information obtained from nursing report, EHR review, patient and/or family, if present.     See Assessment/Plan section regarding any significant events overnight.  All systems reviewed and are unremarkable except as noted in HPI and below.    Vital Signs:  Temp (24hrs) Max:36.6 C (97.9 F)      Temperature: (!) 35.6 C (96 F)  BP (Non-Invasive): 135/85  MAP (Non-Invasive): 96 mmHG  Heart Rate: 60  Respiratory Rate: 12  SpO2: 98 %    Current Medications:  acetaminophen (TYLENOL) tablet, 650 mg, Oral, Q6H PRN  acetaminophen (TYLENOL) tablet, 650 mg, Oral, Q4H PRN  albuterol (PROVENTIL) 2.5 mg / 3 mL (0.083%) neb solution, 2.5 mg, Nebulization, 4x/day  alcohol 62 % (NOZIN NASAL SANITIZER) nasal solution, 1 Each, Each Nostril, 2x/day  amLODIPine (NORVASC) tablet, 10 mg, Oral, Daily  apixaban (ELIQUIS) tablet, 2.5 mg, Oral, 2x/day  aspirin chewable tablet 81 mg, 81 mg, Oral, Daily  atorvastatin (LIPITOR) tablet, 40 mg, Oral, QPM  azithromycin (ZITHROMAX) 500 mg in NS 250 mL IVPB, 500 mg, Intravenous, Q24H  budesonide (PULMICORT RESPULES) 0.5 mg/2 mL nebulizer suspension, 0.5 mg, Nebulization, 2x/day  calcitriol (ROCALTROL) capsule, 1 mcg, Oral, M, W, and F  calcium acetate (PHOSLO) capsule, 1,334 mg, Oral, 3x/day-Meals  cefTRIAXone (ROCEPHIN) 2 g in NS 50 mL IVPB minibag, 2 g, Intravenous, Q24H  cinacalcet (SENSIPAR) tablet, 30 mg, Oral, QPM  cloNIDine (CATAPRES) tablet, 0.2 mg, Oral, 2x/day  cloNIDine (CATAPRES-TTS) transdermal patch (mg/24 hr), 0.3 mg, Transdermal, Q7 Days  clopidogrel (PLAVIX) 75 mg tablet, 75 mg, Oral, Daily  cyanocobalamin (VITAMIN B12) tablet, 1,000 mcg, Oral, Daily with Lunch  cyclobenzaprine (FLEXERIL) tablet, 5 mg, Oral, 2x/day PRN  diphenhydrAMINE (BENADRYL) capsule, 50  mg, Oral, Q6H PRN  doxazosin (CARDURA) tablet, 4 mg, Oral, QPM  epoetin alfa-epbx (RETACRIT) 10,000 units/mL injection, 10,000 Units, Intravenous, Every MO, WE, and FR Give in Dialysis  epoetin alfa-epbx (RETACRIT) 10,000 units/mL injection, 10,000 Units, Intravenous, Once  famotidine (PEPCID) tablet, 40 mg, Oral, Daily  fluconazole (DIFLUCAN) tablet, 200 mg, Oral, Once  folic acid (FOLVITE) tablet, 1 mg, Oral, Daily  furosemide (LASIX) tablet, 160 mg, Oral, Daily  guaiFENesin (MUCINEX) extended release tablet - for cough (expectorant), 600 mg, Oral, Q12H  hydrALAZINE (APRESOLINE) injection 10 mg, 10 mg, Intravenous, Q6H PRN  hydrALAZINE (APRESOLINE) tablet, 100 mg, Oral, Q8HRS  HYDROmorphone (DILAUDID) 2 mg/mL injection, 1 mg, Intravenous, Q4H PRN  hydrOXYzine pamoate (VISTARIL) capsule, 25 mg, Oral, 4x/day PRN  ipratropium-albuterol 0.5 mg-3 mg(2.5 mg base)/3 mL Solution for Nebulization, 3 mL, Nebulization, 4x/day PRN  isosorbide mononitrate (IMDUR) 24 hr extended release tablet, 120 mg, Oral, QAM  labetalol (NORMODYNE) tablet, 300 mg, Oral, 2x/day  loratadine (CLARITIN) tablet, 10 mg, Oral, Daily  losartan (COZAAR) tablet, 100 mg, Oral, NIGHTLY  methylPREDNISolone sod succ (SOLU-MEDROL) 125 mg/2 mL injection, 62.5 mg, Intravenous, Q6H  midodrine (PROAMITINE) tablet, 5 mg, Oral, DIALYSIS DAILY PRN  montelukast (SINGULAIR) 10 mg tablet, 10 mg, Oral, QPM  morphine 2 mg/mL injection, 2 mg, Intravenous, Q8H PRN  nitroGLYCERIN (NITROSTAT) sublingual tablet, 0.4 mg, Sublingual, Q5 Min PRN  ondansetron (ZOFRAN) 2 mg/mL injection, 4 mg, Intravenous, Q8H PRN  pantoprazole (PROTONIX) delayed release tablet, 40 mg, Oral, Daily  prochlorperazine (COMPAZINE) 5 mg/mL injection, 10 mg, Intravenous, Q6H PRN  spironolactone (ALDACTONE) tablet, 50 mg, Oral, 2X/day        Current Orders:  Active Orders   Diet    DIET RENAL Do you want to initiate MNT Protocol? Yes     Frequency: All Meals     Number of Occurrences: 1 Occurrences    Nursing    ACTIVITY Activity: AS TOLERATED; Instructions: WITH ASSIST     Frequency: UNTIL DISCONTINUED     Number of Occurrences: Until Specified    APIXABAN NURSING ORDER     Frequency: UNTIL DISCONTINUED     Number of Occurrences: Until Specified     Order Comments: Nursing Instructions:    1.  Print apixaban (Eliquis) guide using the link in this order.   2.  Nurse to provide apixaban patient guide to all patients and be sure that all new starts have received education by the trained anticoagulation educator.  If additional questions, contact pharmacy.           CONTINUOUS CARDIAC MONITORING (ED USE ONLY)     Frequency: CONTINUOUS     Number of Occurrences: Until Specified    NOTIFY MD     Frequency: PRN     Number of Occurrences: Until Specified    Notify MD Vital Signs     Frequency: PRN     Number of Occurrences: Until Specified    PT IS MEDIUM RISK FOR VENOUS THROMBOEMBOLISM     Frequency: CONTINUOUS     Number of Occurrences: Until Specified    PULSE OXIMETRY CONTINUOUS     Frequency: CONTINUOUS     Number of Occurrences: Until Specified    PULSE OXIMETRY Q4H     Frequency: Q4H     Number of Occurrences: Until Specified    TELEMETRY MONITORING - Continuous     Frequency: CONTINUOUS     Number of Occurrences: Until Specified    VITAL SIGNS  Q4H     Frequency: Q4H     Number of Occurrences: Until Specified    VITAL SIGNS MULTI FREQUENCY     Frequency: UNTIL DISCONTINUED     Number of Occurrences: Until Specified    WAS PATIENT ON APIXABAN PRIOR TO ADMISSION?     Frequency: UNTIL DISCONTINUED     Number of Occurrences: Until Specified    WEIGH PATIENT PRE AND POST DIALYSIS TREATMENT     Frequency: EVERY OTHER DAY     Number of Occurrences: Until Specified    WEIGH PATIENT PRE AND POST DIALYSIS TREATMENT     Frequency: EVERY OTHER DAY     Number of Occurrences: Until Specified    WEIGH PATIENT PRE AND POST DIALYSIS TREATMENT     Frequency: EVERY OTHER DAY     Number of Occurrences: Until Specified   Code  Status    FULL CODE     Frequency: CONTINUOUS     Number of Occurrences: Until Specified   Consult    IP CONSULT TO CARDIOLOGY Requested Provider; Anderson Malta A     Frequency: ONE TIME     Number of Occurrences: 1 Occurrences    IP CONSULT TO NEPHROLOGY Requested Provider; ETER, AHMAD     Frequency: ONE TIME     Number of Occurrences: 1 Occurrences   IV    INSERT & MAINTAIN PERIPHERAL IV ACCESS     Frequency: UNTIL DISCONTINUED     Number of Occurrences: Until Specified   Dialysis  HEMODIALYSIS     Frequency: ONE TIME     Number of Occurrences: 1 Occurrences     Order Comments: Dialysis access, use CVC unless patient has AV fistula or AV graft  Heparin:  2000 units at the start of dialysis, as long as hemoglobin above 7, platelets above 5,0000 and No GI Bleed/active bleeding   UF Goal 1.0- 3.0 L  Contact me for any questions      HEMODIALYSIS     Frequency: ONE TIME     Number of Occurrences: 1 Occurrences     Order Comments: Dialysis access, use CVC unless patient has AV fistula or AV graft  Heparin:  2000 units at the start of dialysis, as long as hemoglobin above 7, platelets above 5,0000 and No GI Bleed/active bleeding   UF Goal 1.0- 3.0 L  Contact me for any questions      HEMODIALYSIS     Frequency: ONE TIME     Number of Occurrences: 1 Occurrences   Medications    acetaminophen (TYLENOL) tablet     Frequency: Q6H PRN     Dose: 650 mg     Route: Oral    acetaminophen (TYLENOL) tablet     Frequency: Q4H PRN     Dose: 650 mg     Route: Oral    albuterol (PROVENTIL) 2.5 mg / 3 mL (0.083%) neb solution     Frequency: 4x/day     Dose: 2.5 mg     Route: Nebulization    alcohol 62 % (NOZIN NASAL SANITIZER) nasal solution     Frequency: 2x/day     Dose: 1 Each     Route: Each Nostril    amLODIPine (NORVASC) tablet     Frequency: Daily     Dose: 10 mg     Route: Oral    apixaban (ELIQUIS) tablet     Frequency: 2x/day     Dose: 2.5 mg     Route: Oral    aspirin chewable tablet 81 mg     Frequency: Daily     Dose: 81  mg     Route: Oral    atorvastatin (LIPITOR) tablet     Frequency: QPM     Dose: 40 mg     Route: Oral    azithromycin (ZITHROMAX) 500 mg in NS 250 mL IVPB     Frequency: Q24H     Dose: 500 mg     Route: Intravenous    budesonide (PULMICORT RESPULES) 0.5 mg/2 mL nebulizer suspension     Frequency: 2x/day     Dose: 0.5 mg     Route: Nebulization    calcitriol (ROCALTROL) capsule     Frequency: M, W, and F     Dose: 1 mcg     Route: Oral    calcium acetate (PHOSLO) capsule     Frequency: 3x/day-Meals     Dose: 1,334 mg     Route: Oral    cefTRIAXone (ROCEPHIN) 2 g in NS 50 mL IVPB minibag     Frequency: Q24H     Dose: 2 g     Route: Intravenous    cinacalcet (SENSIPAR) tablet     Frequency: QPM     Dose: 30 mg     Route: Oral    cloNIDine (CATAPRES) tablet     Frequency: 2x/day     Dose: 0.2 mg     Route: Oral    cloNIDine (CATAPRES-TTS) transdermal patch (mg/24 hr)  Frequency: Q7 Days     Dose: 0.3 mg     Route: Transdermal    clopidogrel (PLAVIX) 75 mg tablet     Frequency: Daily     Dose: 75 mg     Route: Oral    cyanocobalamin (VITAMIN B12) tablet     Frequency: Daily with Lunch     Dose: 1,000 mcg     Route: Oral    cyclobenzaprine (FLEXERIL) tablet     Frequency: 2x/day PRN     Dose: 5 mg     Route: Oral    diphenhydrAMINE (BENADRYL) capsule     Frequency: Q6H PRN     Dose: 50 mg     Route: Oral    doxazosin (CARDURA) tablet     Frequency: QPM     Dose: 4 mg     Route: Oral    epoetin alfa-epbx (RETACRIT) 10,000 units/mL injection     Frequency: Every MO, WE, and FR Give in Dialysis     Dose: 10,000 Units     Route: Intravenous    epoetin alfa-epbx (RETACRIT) 10,000 units/mL injection     Frequency: Once     Dose: 10,000 Units     Route: Intravenous    famotidine (PEPCID) tablet     Frequency: Daily     Dose: 40 mg     Route: Oral    fluconazole (DIFLUCAN) tablet     Frequency: Once     Dose: 200 mg     Route: Oral    folic acid (FOLVITE) tablet     Frequency: Daily     Dose: 1 mg     Route: Oral     furosemide (LASIX) tablet     Frequency: Daily     Dose: 160 mg     Route: Oral    guaiFENesin (MUCINEX) extended release tablet - for cough (expectorant)     Frequency: Q12H     Dose: 600 mg     Route: Oral    hydrALAZINE (APRESOLINE) injection 10 mg     Frequency: Q6H PRN     Dose: 10 mg     Route: Intravenous    hydrALAZINE (APRESOLINE) tablet     Frequency: Q8HRS     Dose: 100 mg     Route: Oral    HYDROmorphone (DILAUDID) 2 mg/mL injection     Frequency: Q4H PRN     Dose: 1 mg     Route: Intravenous    hydrOXYzine pamoate (VISTARIL) capsule     Frequency: 4x/day PRN     Dose: 25 mg     Route: Oral    ipratropium-albuterol 0.5 mg-3 mg(2.5 mg base)/3 mL Solution for Nebulization     Frequency: 4x/day PRN     Dose: 3 mL     Route: Nebulization    isosorbide mononitrate (IMDUR) 24 hr extended release tablet     Frequency: QAM     Dose: 120 mg     Route: Oral    labetalol (NORMODYNE) tablet     Frequency: 2x/day     Dose: 300 mg     Route: Oral    loratadine (CLARITIN) tablet     Frequency: Daily     Dose: 10 mg     Route: Oral    losartan (COZAAR) tablet     Frequency: NIGHTLY     Dose: 100 mg     Route: Oral    methylPREDNISolone sod succ (SOLU-MEDROL) 125 mg/2 mL  injection     Frequency: Q6H     Dose: 62.5 mg     Route: Intravenous    midodrine (PROAMITINE) tablet     Frequency: DIALYSIS DAILY PRN     Dose: 5 mg     Route: Oral     Order Comments: Administer at start of HD.  May repeat the dose after 30 min from start of HD    montelukast (SINGULAIR) 10 mg tablet     Frequency: QPM     Dose: 10 mg     Route: Oral    morphine 2 mg/mL injection     Frequency: Q8H PRN     Dose: 2 mg     Route: Intravenous    nitroGLYCERIN (NITROSTAT) sublingual tablet     Frequency: Q5 Min PRN     Dose: 0.4 mg     Route: Sublingual    ondansetron (ZOFRAN) 2 mg/mL injection     Frequency: Q8H PRN     Dose: 4 mg     Route: Intravenous    pantoprazole (PROTONIX) delayed release tablet     Frequency: Daily     Dose: 40 mg     Route:  Oral    prochlorperazine (COMPAZINE) 5 mg/mL injection     Frequency: Q6H PRN     Dose: 10 mg     Route: Intravenous    spironolactone (ALDACTONE) tablet     Frequency: 2X/day     Dose: 50 mg     Route: Oral        Today's Exam     -GENERAL: No acute distress, Resting comfortably   -SKIN: warm and dry.  -CARDIAC: RRR, no MRGs  -LUNGS: Clear to auscultation bilaterally. No wheezes or rhonchi.  -EXTREMITIES: No pedal edema noted. Distal pulses equal bilaterally.  -NEURO: Cranial nerves grossly intact. No gross focal deficit.     I/O:  I/O last 24 hours:    Intake/Output Summary (Last 24 hours) at 10/19/2021 1325  Last data filed at 10/18/2021 1900  Gross per 24 hour   Intake 510 ml   Output --   Net 510 ml     I/O current shift:  No intake/output data recorded.    Labs:  CBC:     12.6* (08/26 0440) \   8.7* (08/26 0440) /   351 (08/26 0440)      / 26.5* (08/26 0440) \          BMP:   134* (08/26 0440) 94* (08/26 0440) 44* (08/26 0440)    /     118* (08/26 0440)   4.6 (08/26 0440) 26 (08/26 0440) 7.01* (08/26 0440) \              Recent Results (from the past 24 hour(s))   COMPREHENSIVE METABOLIC PANEL, NON-FASTING    Collection Time: 10/18/21  2:28 PM   Result Value    ALKALINE PHOSPHATASE 65    ALT (SGPT) <7 (L)    AST (SGOT) 10 (L)        Radiology:  MRI SPINE LUMBOSACRAL WO CONTRAST    Result Date: 10/17/2021  Impression DEGENERATIVE CHANGES AT L5-S1. NO EVIDENCE OF DISCITIS OSTEOMYELITIS. Radiologist location ID: [id number]       The laboratory results, imaging results and other diagnostic results were reviewed in the EMR.    Problem List:  Active Hospital Problems   (*Primary Problem)    Diagnosis    Acute exacerbation of COPD with asthma (CMS  [place])    H/O heart artery stent    Hypertensive urgency    Acute respiratory failure with hypoxia (CMS HCC)    Fluid overload    Anemia in chronic kidney disease    End-stage renal disease on hemodialysis (CMS HCC)       Assessment/ Plan:   46 yo female with ESRD on HD  admitted for resp failure AECOPD, HTN urgency.    -10/18/21: pt seen examined at bedside. Pt reported having a chest pain that she reported as "typical for me after dialysis", which was improved with pain meds, no longer having issue.  -10/19/2021:  Patient seen and examined at bedside.  Per nursing staff, patient had positive fecal blood test, therefore Eliquis was held overnight, we will go ahead and restart this morning, at increased risk therefore benefits outweigh the risk.  Hemoglobin stable.  Nephrology following patient regarding kidney function.  Patient reported having some "issue with my nerves, previously was on Vistaril Ativan Klonopin".    Anxiety  -continue with vistaril, discuss with pt, she must ask for it, as it's PRN.    FOBT  -positive FOBT, H/H stable and pt at high risk for VTE d/t severe hx in the past, therefore benefits outweigh risk, if Hgb downtrend, will consult cards regarding RBA with anticogulation.    Acute respiratory failure with hypoxia   -course: etiology likely secondary to COPD exacerbation versus fluid overload. Patient has been noncompliant with hemodialysis.  Abx started on 10/17/21.  - Continue with nebs, Solu-Medrol for COPD exacerbation.  Continue with supplemental oxygen titration.       End-stage renal disease on hemodialysis:    - Nephrology consulted     CAD  -course: recent stent placement on 08/15  -Continue with aspirin and Plavix.  Per records from [facility] patient is a high risk candidate for CABG.      Low back pain w/ abnormal imaging seen on CT scan 08/2021:  suspicious for osteomyelitis - low back MRI was negative    DVT/PE Prophylaxis: Apixaban    Disposition Planning: Depending on clinical course.     The total amount of time spent on this encounter including review of historical information, labs, direct face-to-face time, discussion of care plan, care coordination and documentation was > 35 minutes.    Gar Ponto, DO  [facility]    Contents of  the document, in whole or in part, are completed utilizing M*Modal dictation technology, please forgive any typographical errors that may exist.

## 2021-10-19 NOTE — Care Plan (Signed)
Problem: Adult Inpatient Plan of Care  Goal: Plan of Care Review  Outcome: Ongoing (see interventions/notes)  Goal: Patient-Specific Goal (Individualized)  Outcome: Ongoing (see interventions/notes)  Goal: Absence of Hospital-Acquired Illness or Injury  Outcome: Ongoing (see interventions/notes)  Goal: Optimal Comfort and Wellbeing  Outcome: Ongoing (see interventions/notes)  Goal: Rounds/Family Conference  Outcome: Ongoing (see interventions/notes)     Problem: Fluid Volume Excess  Goal: Fluid Balance  Outcome: Ongoing (see interventions/notes)     Problem: Pain Acute  Goal: Optimal Pain Control and Function  Outcome: Ongoing (see interventions/notes)     Problem: Gas Exchange Impaired  Goal: Optimal Gas Exchange  Outcome: Ongoing (see interventions/notes)     Problem: Skin Injury Risk Increased  Goal: Skin Health and Integrity  Outcome: Ongoing (see interventions/notes)

## 2021-10-19 NOTE — Respiratory Therapy (Signed)
Scheduled HHN not administered, L.O.T.

## 2021-10-19 NOTE — Consults (Signed)
[name]    NEPHROLOGY CONSULTATION NOTE       [name] 46 y.o. female 318/A   Date of Service: 10/19/2021    Date of Admission:  10/15/2021   PCP: Dawn Hai, FNP Code Status:Full Code       Reason for Consultation:  End-stage renal disease on hemodialysis MWF     HPI:   46 year old female with past medical history of end-stage renal disease on hemodialysis Mondays Wednesdays and Fridays missed dialysis on Monday due to issues with her oxygen delivery was delayed on Monday.    Patient reports today that she is short of breath she denies nausea or vomiting but reports decrease in appetite she also reports that her body all is hurting, she reported some bleeding from her nose.    Today she is feeling ok. No edema or SOB. Has back pain.      ROS:   Systematic review of 12 organ systems was negative except what mentioned in in the HPI.        Current medications   acetaminophen (TYLENOL) tablet, 650 mg, Oral, Q6H PRN  acetaminophen (TYLENOL) tablet, 650 mg, Oral, Q4H PRN  albuterol (PROVENTIL) 2.5 mg / 3 mL (0.083%) neb solution, 2.5 mg, Nebulization, 4x/day  alcohol 62 % (NOZIN NASAL SANITIZER) nasal solution, 1 Each, Each Nostril, 2x/day  amLODIPine (NORVASC) tablet, 10 mg, Oral, Daily  apixaban (ELIQUIS) tablet, 2.5 mg, Oral, 2x/day  aspirin chewable tablet 81 mg, 81 mg, Oral, Daily  atorvastatin (LIPITOR) tablet, 40 mg, Oral, QPM  azithromycin (ZITHROMAX) 500 mg in NS 250 mL IVPB, 500 mg, Intravenous, Q24H  budesonide (PULMICORT RESPULES) 0.5 mg/2 mL nebulizer suspension, 0.5 mg, Nebulization, 2x/day  calcitriol (ROCALTROL) capsule, 1 mcg, Oral, M, W, and F  cefTRIAXone (ROCEPHIN) 2 g in NS 50 mL IVPB minibag, 2 g, Intravenous, Q24H  cinacalcet (SENSIPAR) tablet, 30 mg, Oral, QPM  cloNIDine (CATAPRES) tablet, 0.2 mg, Oral, 2x/day  cloNIDine (CATAPRES-TTS) transdermal patch (mg/24 hr), 0.3 mg, Transdermal, Q7 Days  clopidogrel (PLAVIX) 75 mg tablet, 75 mg, Oral,  Daily  cyanocobalamin (VITAMIN B12) tablet, 1,000 mcg, Oral, Daily with Lunch  cyclobenzaprine (FLEXERIL) tablet, 5 mg, Oral, 2x/day PRN  diphenhydrAMINE (BENADRYL) capsule, 50 mg, Oral, Q6H PRN  doxazosin (CARDURA) tablet, 4 mg, Oral, QPM  epoetin alfa-epbx (RETACRIT) 10,000 units/mL injection, 10,000 Units, Intravenous, Every MO, WE, and FR Give in Dialysis  epoetin alfa-epbx (RETACRIT) 10,000 units/mL injection, 10,000 Units, Intravenous, Once  famotidine (PEPCID) tablet, 40 mg, Oral, Daily  ferric citrate Tablet 630 mg, 630 mg, Oral, 3x/day AC  folic acid (FOLVITE) tablet, 1 mg, Oral, Daily  furosemide (LASIX) tablet, 160 mg, Oral, Daily  guaiFENesin (MUCINEX) extended release tablet - for cough (expectorant), 600 mg, Oral, Q12H  hydrALAZINE (APRESOLINE) injection 10 mg, 10 mg, Intravenous, Q6H PRN  hydrALAZINE (APRESOLINE) tablet, 100 mg, Oral, Q8HRS  HYDROmorphone (DILAUDID) 2 mg/mL injection, 1 mg, Intravenous, Q4H PRN  hydrOXYzine pamoate (VISTARIL) capsule, 25 mg, Oral, 4x/day PRN  ipratropium-albuterol 0.5 mg-3 mg(2.5 mg base)/3 mL Solution for Nebulization, 3 mL, Nebulization, 4x/day PRN  isosorbide mononitrate (IMDUR) 24 hr extended release tablet, 120 mg, Oral, QAM  labetalol (NORMODYNE) tablet, 300 mg, Oral, 2x/day  loratadine (CLARITIN) tablet, 10 mg, Oral, Daily  losartan (COZAAR) tablet, 100 mg, Oral, NIGHTLY  methylPREDNISolone sod succ (SOLU-MEDROL) 125 mg/2 mL injection, 62.5 mg, Intravenous, Q6H  midodrine (PROAMITINE) tablet, 5 mg, Oral, DIALYSIS DAILY PRN  montelukast (SINGULAIR) 10 mg tablet, 10 mg,  Oral, QPM  morphine 2 mg/mL injection, 2 mg, Intravenous, Q8H PRN  nitroGLYCERIN (NITROSTAT) sublingual tablet, 0.4 mg, Sublingual, Q5 Min PRN  ondansetron (ZOFRAN) 2 mg/mL injection, 4 mg, Intravenous, Q8H PRN  pantoprazole (PROTONIX) delayed release tablet, 40 mg, Oral, Daily  prochlorperazine (COMPAZINE) 5 mg/mL injection, 10 mg, Intravenous, Q6H PRN  spironolactone (ALDACTONE) tablet, 50 mg,  Oral, 2X/day            Physical:  Filed Vitals:    10/19/21 0900 10/19/21 1000 10/19/21 1005 10/19/21 1056   BP: 135/85      Pulse: 59  60 60   Resp: 12      Temp: (!) 35.6 C (96 F)      SpO2: 95% 98%          Intake/Output Summary (Last 24 hours) at 10/19/2021 1227  Last data filed at 10/18/2021 1900  Gross per 24 hour   Intake 510 ml   Output --   Net 510 ml        Patient is NAD   PermCath in place  Cardiovascular system: Regular rate and rhythm no murmurs rubs or gallops. No chest wall tenderness  Lungs:  Breath sounds diminished bilaterally.   Abdomen soft nontender nondistended.  Extremities no clubbing cyanosis or edema   Neuro exam: EOMI, normal speech    Labs:  CBC:     12.6* (08/26 0440) \   8.7* (08/26 0440) /   351 (08/26 0440)      / 26.5* (08/26 0440) \          BMP:   134* (08/26 0440) 94* (08/26 0440) 44* (08/26 0440)    /     118* (08/26 0440)   4.6 (08/26 0440) 26 (08/26 0440) 7.01* (08/26 0440) \                 Diagnostic studies:  Imaging:   ECG 12 LEAD  Sinus rhythm with 1st degree AV block  Minimal voltage criteria for LVH, may be normal variant ( [facility] product )  Prolonged QT  Abnormal ECG  Confirmed by [name] ([id number]) on 10/17/2021 10:39:50 PM  MRI SPINE LUMBOSACRAL WO CONTRAST  Narrative: [name]    RADIOLOGIST: Danne Baxter, MD    MRI SPINE LUMBOSACRAL WO CONTRAST performed on 10/17/2021 4:33 PM    CLINICAL HISTORY: ? L5-S1 osteomyelitis.  LOW BACK PAIN-ABNORMAL CT ABD L5-S1    TECHNIQUE:  Noncontrast lumbar spine MRI.    COMPARISON:  X-rays 09/15/2019    FINDINGS:   Vertebrae:  Lumbar vertebral body heights are preserved.   There is chronic endplate signal change without edema and this space at L5-S1 related to advanced degenerative disease. There is no imaging finding to indicate discitis osteomyelitis. The findings at L5-S1 abdomen present dating back to CT in 2021.    Alignment:  Normal.  No spondylolisthesis.    Conus Medullaris:  Normally positioned.    L1-2:   Unremarkable    L2-3:  Unremarkable    L3-4:  There is disc space narrowing and disc desiccation without a significant disc bulge. There is no stenosis.    L4-5:  There is disc space narrowing disc desiccation with broad-based disc bulge and superimposed central protrusion. There is mild bilateral facet disease. There is no significant stenosis or any evidence of nerve root contact.    L5-S1:  Postsurgical changes from right hemilaminectomy are noted. There is extensive endplate signal change with disc space narrowing. There  is no significant disc bulge or stenosis. There is no evidence of nerve root contact.    Sacrum:  Visualized upper sacrum and SI joints are unremarkable.    Review of the soft tissues demonstrate multiple left renal cysts. There appears to be bilateral atrophy of the kidneys.  Impression: DEGENERATIVE CHANGES AT L5-S1. NO EVIDENCE OF DISCITIS OSTEOMYELITIS.    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)    H/O heart artery stent    Hypertensive urgency    Acute respiratory failure with hypoxia (CMS HCC)    Fluid overload    Anemia in chronic kidney disease    End-stage renal disease on hemodialysis (CMS HCC)       Plan:         End-stage renal disease on hemodialysis  -Continue dialysis 3 times a week on MWF  -please refer to dialysis orders    Anemia  -Better   -on Eliquis due to PE  - on dual antiplatelets due to coronary artery disease  -Monitor  -Epogen  -if patient develops chest pain we will transfuse 1 unit of blood      Heart disease  -we will need outpatient follow-up she may need open heart surgery continue current blood thinners.  -keep hemoglobin close to 10.  -continue good management for blood pressure.    CKD MBD  -Phosphorus 6.5  -Auryxia 3 tab TID wm   -aim for Phos level 3.5-5.5  -Renal diet    Acid-base  -Stable  -Continue  dialysis    Electrolytes  -Monitor  -See dialysis orders  -Replace as needed  -secondary hyperparathyroidism continue Sensipar    Volume status  -Fluid restriction  -Dialysis      Hypertension  -adjust BP meds see orders         MY ORDERS LAST 24 (24h ago, onward)       Start     Ordered    10/19/21 1400  hydrALAZINE (APRESOLINE) tablet  EVERY 8 HOURS (SCHEDULED)         10/19/21 0905    10/19/21 1100  ferric citrate Tablet 630 mg  3 TIMES DAILY BEFORE MEALS         10/19/21 0801                                Beather Arbour, MD, FASN, 10/19/2021, 12:27

## 2021-10-20 DIAGNOSIS — I1311 Hypertensive heart and chronic kidney disease without heart failure, with stage 5 chronic kidney disease, or end stage renal disease: Secondary | ICD-10-CM

## 2021-10-20 DIAGNOSIS — J9601 Acute respiratory failure with hypoxia: Secondary | ICD-10-CM

## 2021-10-20 LAB — CBC WITH DIFF
BASOPHIL #: 0 10*3/uL (ref 0.00–0.30)
BASOPHIL %: 0 % (ref 0–3)
EOSINOPHIL #: 0 10*3/uL (ref 0.00–0.80)
EOSINOPHIL %: 0 % (ref 0–7)
HCT: 26.5 % — ABNORMAL LOW (ref 37.0–47.0)
HGB: 8.7 g/dL — ABNORMAL LOW (ref 12.5–16.0)
LYMPHOCYTE #: 0.4 10*3/uL — ABNORMAL LOW (ref 1.10–5.00)
LYMPHOCYTE %: 3 % — ABNORMAL LOW (ref 25–45)
MCH: 28.3 pg (ref 27.0–32.0)
MCHC: 32.9 g/dL (ref 32.0–36.0)
MCV: 86 fL (ref 78.0–99.0)
MONOCYTE #: 0.3 10*3/uL (ref 0.00–1.30)
MONOCYTE %: 2 % (ref 0–12)
MPV: 7.2 fL — ABNORMAL LOW (ref 7.4–10.4)
NEUTROPHIL #: 10.8 10*3/uL — ABNORMAL HIGH (ref 1.80–8.40)
NEUTROPHIL %: 94 % — ABNORMAL HIGH (ref 40–76)
PLATELET COMMENT: NORMAL
PLATELETS: 357 10*3/uL (ref 140–440)
RBC: 3.08 10*6/uL — ABNORMAL LOW (ref 4.20–5.40)
RDW: 20.3 % — ABNORMAL HIGH (ref 11.6–14.8)
WBC: 11.5 10*3/uL — ABNORMAL HIGH (ref 4.0–10.5)
WBCS UNCORRECTED: 11.5 10*3/uL

## 2021-10-20 LAB — BASIC METABOLIC PANEL
ANION GAP: 17 mmol/L — ABNORMAL HIGH (ref 4–13)
BUN/CREA RATIO: 7 (ref 6–22)
BUN: 66 mg/dL — ABNORMAL HIGH (ref 7–25)
CALCIUM: 9.2 mg/dL (ref 8.6–10.3)
CHLORIDE: 93 mmol/L — ABNORMAL LOW (ref 98–107)
CO2 TOTAL: 24 mmol/L (ref 21–31)
CREATININE: 9.24 mg/dL — ABNORMAL HIGH (ref 0.60–1.30)
ESTIMATED GFR: 5 mL/min/{1.73_m2} — ABNORMAL LOW (ref >59)
GLUCOSE: 156 mg/dL — ABNORMAL HIGH (ref 74–109)
OSMOLALITY, CALCULATED: 291 mOsm/kg — ABNORMAL HIGH (ref 270–290)
POTASSIUM: 4.8 mmol/L (ref 3.5–5.1)
SODIUM: 134 mmol/L — ABNORMAL LOW (ref 136–145)

## 2021-10-20 LAB — ADULT ROUTINE BLOOD CULTURE, SET OF 2 BOTTLES (BACTERIA AND YEAST)
BLOOD CULTURE, ROUTINE: NO GROWTH
BLOOD CULTURE, ROUTINE: NO GROWTH

## 2021-10-20 LAB — POC BLOOD GLUCOSE (RESULTS): GLUCOSE, POC: 144 mg/dl (ref 50–500)

## 2021-10-20 LAB — C-REACTIVE PROTEIN (CRP): C-REACTIVE PROTEIN (CRP): 2 mg/dL — ABNORMAL HIGH (ref 0.1–0.5)

## 2021-10-20 LAB — MAGNESIUM: MAGNESIUM: 2 mg/dL (ref 1.9–2.7)

## 2021-10-20 MED ORDER — BUDESONIDE-FORMOTEROL HFA 160 MCG-4.5 MCG/ACTUATION AEROSOL INHALER
2.0000 | INHALATION_SPRAY | Freq: Two times a day (BID) | RESPIRATORY_TRACT | Status: DC
Start: 2021-10-20 — End: 2021-10-20

## 2021-10-20 MED ORDER — BUDESONIDE-FORMOTEROL HFA 160 MCG-4.5 MCG/ACTUATION AEROSOL INHALER
2.0000 | INHALATION_SPRAY | Freq: Two times a day (BID) | RESPIRATORY_TRACT | Status: DC
Start: 2021-10-20 — End: 2021-10-21
  Administered 2021-10-20: 0 via RESPIRATORY_TRACT
  Administered 2021-10-21: 2 via RESPIRATORY_TRACT

## 2021-10-20 MED ORDER — CLONIDINE 0.1 MG/24 HR WEEKLY TRANSDERMAL PATCH
0.3000 mg | MEDICATED_PATCH | TRANSDERMAL | Status: DC
Start: 2021-10-20 — End: 2021-10-21
  Administered 2021-10-20: 0.3 mg via TRANSDERMAL
  Filled 2021-10-20: qty 3

## 2021-10-20 MED ORDER — ALBUTEROL SULFATE 2.5 MG/3 ML (0.083 %) SOLUTION FOR NEBULIZATION
2.5000 mg | INHALATION_SOLUTION | Freq: Four times a day (QID) | RESPIRATORY_TRACT | Status: DC | PRN
Start: 2021-10-20 — End: 2021-10-21

## 2021-10-20 NOTE — Consults (Addendum)
[place]    NEPHROLOGY CONSULTATION NOTE       [name] 46 y.o. female 318/A   Date of Service: 10/20/2021    Date of Admission:  10/15/2021   PCP: Cam Hai, FNP Code Status:Full Code       Reason for Consultation:  End-stage renal disease on hemodialysis MWF     HPI:   46 year old female with past medical history of end-stage renal disease on hemodialysis Mondays Wednesdays and Fridays missed dialysis on Monday due to issues with her oxygen delivery was delayed on Monday.    Patient reports today that she is short of breath she denies nausea or vomiting but reports decrease in appetite she also reports that her body all is hurting, she reported some bleeding from her nose.    Today she is feeling ok.has back pain and wants cardiac diet.      ROS:   Systematic review of 12 organ systems was negative except what mentioned in in the HPI.        Current medications   acetaminophen (TYLENOL) tablet, 650 mg, Oral, Q6H PRN  acetaminophen (TYLENOL) tablet, 650 mg, Oral, Q4H PRN  albuterol (PROVENTIL) 2.5 mg / 3 mL (0.083%) neb solution, 2.5 mg, Nebulization, Q6H PRN  alcohol 62 % (NOZIN NASAL SANITIZER) nasal solution, 1 Each, Each Nostril, 2x/day  amLODIPine (NORVASC) tablet, 10 mg, Oral, Daily  apixaban (ELIQUIS) tablet, 2.5 mg, Oral, 2x/day  aspirin chewable tablet 81 mg, 81 mg, Oral, Daily  atorvastatin (LIPITOR) tablet, 40 mg, Oral, QPM  azithromycin (ZITHROMAX) 500 mg in NS 250 mL IVPB, 500 mg, Intravenous, Q24H  budesonide (PULMICORT RESPULES) 0.5 mg/2 mL nebulizer suspension, 0.5 mg, Nebulization, 2x/day  budesonide-formoterol (SYMBICORT) 160 mcg-4.5 mcg per inhalation oral inhaler - "Respiratory to administer", 2 Puff, Inhalation, 2x/day  calcitriol (ROCALTROL) capsule, 1 mcg, Oral, M, W, and F  calcium acetate (PHOSLO) capsule, 1,334 mg, Oral, 3x/day-Meals  cefTRIAXone (ROCEPHIN) 2 g in NS 50 mL IVPB minibag, 2 g, Intravenous, Q24H  cinacalcet (SENSIPAR) tablet, 30 mg,  Oral, QPM  cloNIDine (CATAPRES) tablet, 0.2 mg, Oral, 2x/day  cloNIDine (CATAPRES-TTS) transdermal patch (mg/24 hr), 0.3 mg, Transdermal, Q7 Days  clopidogrel (PLAVIX) 75 mg tablet, 75 mg, Oral, Daily  cyanocobalamin (VITAMIN B12) tablet, 1,000 mcg, Oral, Daily with Lunch  cyclobenzaprine (FLEXERIL) tablet, 5 mg, Oral, 2x/day PRN  diphenhydrAMINE (BENADRYL) capsule, 50 mg, Oral, Q6H PRN  docusate sodium (COLACE) capsule, 100 mg, Oral, 2x/day  doxazosin (CARDURA) tablet, 4 mg, Oral, QPM  epoetin alfa-epbx (RETACRIT) 10,000 units/mL injection, 10,000 Units, Intravenous, Every MO, WE, and FR Give in Dialysis  epoetin alfa-epbx (RETACRIT) 10,000 units/mL injection, 10,000 Units, Intravenous, Once  famotidine (PEPCID) tablet, 40 mg, Oral, Daily  fluconazole (DIFLUCAN) tablet, 200 mg, Oral, Once  folic acid (FOLVITE) tablet, 1 mg, Oral, Daily  furosemide (LASIX) tablet, 160 mg, Oral, Daily  guaiFENesin (MUCINEX) extended release tablet - for cough (expectorant), 600 mg, Oral, Q12H  hydrALAZINE (APRESOLINE) injection 10 mg, 10 mg, Intravenous, Q6H PRN  hydrALAZINE (APRESOLINE) tablet, 100 mg, Oral, Q8HRS  HYDROmorphone (DILAUDID) 2 mg/mL injection, 1 mg, Intravenous, Q4H PRN  hydrOXYzine pamoate (VISTARIL) capsule, 25 mg, Oral, 4x/day PRN  ipratropium-albuterol 0.5 mg-3 mg(2.5 mg base)/3 mL Solution for Nebulization, 3 mL, Nebulization, 4x/day PRN  isosorbide mononitrate (IMDUR) 24 hr extended release tablet, 120 mg, Oral, QAM  labetalol (NORMODYNE) tablet, 300 mg, Oral, 2x/day  loratadine (CLARITIN) tablet, 10 mg, Oral, Daily  losartan (COZAAR) tablet, 100 mg,  Oral, NIGHTLY  methylPREDNISolone sod succ (SOLU-MEDROL) 125 mg/2 mL injection, 62.5 mg, Intravenous, Q6H  midodrine (PROAMITINE) tablet, 5 mg, Oral, DIALYSIS DAILY PRN  montelukast (SINGULAIR) 10 mg tablet, 10 mg, Oral, QPM  morphine 2 mg/mL injection, 2 mg, Intravenous, Q8H PRN  nitroGLYCERIN (NITROSTAT) sublingual tablet, 0.4 mg, Sublingual, Q5 Min  PRN  ondansetron (ZOFRAN) 2 mg/mL injection, 4 mg, Intravenous, Q8H PRN  pantoprazole (PROTONIX) delayed release tablet, 40 mg, Oral, Daily  prochlorperazine (COMPAZINE) 5 mg/mL injection, 10 mg, Intravenous, Q6H PRN  spironolactone (ALDACTONE) tablet, 50 mg, Oral, 2X/day            Physical:  Filed Vitals:    10/20/21 1022 10/20/21 1140 10/20/21 1200 10/20/21 1400   BP:  135/79  120/75   Pulse: 63 62     Resp:  20     Temp:  (!) 35.9 C (96.7 F)     SpO2: 98% 97% 97% 96%        Intake/Output Summary (Last 24 hours) at 10/20/2021 1518  Last data filed at 10/19/2021 1800  Gross per 24 hour   Intake --   Output 300 ml   Net -300 ml        Patient is NAD   PermCath in place  Cardiovascular system: Regular rate and rhythm no murmurs rubs or gallops. No chest wall tenderness  Lungs:  Breath sounds diminished bilaterally.   Abdomen soft nontender nondistended.  Extremities no clubbing cyanosis or edema   Neuro exam: EOMI, normal speech    Labs:  CBC:     11.5* (08/27 0407) \   8.7* (08/27 0407) /   357 (08/27 0407)      / 26.5* (08/27 0407) \          BMP:   134* (08/27 0407) 93* (08/27 0407) 66* (08/27 0407)    /     156* (08/27 0407)   4.8 (08/27 0407) 24 (08/27 0407) 9.24* (08/27 0407) \                 Diagnostic studies:  Imaging:   ECG 12 LEAD  Sinus rhythm with 1st degree AV block  Minimal voltage criteria for LVH, may be normal variant ( [facility] product )  Prolonged QT  Abnormal ECG  Confirmed by Anderson Malta ([id number]) on 10/19/2021 4:07:20 PM      Assessments:  Active Hospital Problems   (*Primary Problem)    Diagnosis    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)    H/O heart artery stent    Hypertensive urgency    Acute respiratory failure with hypoxia (CMS HCC)    Fluid overload    Anemia in chronic kidney disease    End-stage renal disease on hemodialysis (CMS HCC)       Plan:         End-stage renal disease on hemodialysis  -Continue dialysis 3 times a week on MWF  -please refer to dialysis  orders    Anemia  -Better   -on Eliquis due to PE  - on dual antiplatelets due to coronary artery disease  -Monitor  -Epogen  -if patient develops chest pain we will transfuse 1 unit of blood      Heart disease  -we will need outpatient follow-up she may need open heart surgery continue current blood thinners.  -keep hemoglobin close to 10.  -continue good management for blood pressure.    CKD MBD  -Phosphorus 6.5  -Auryxia 3  tab TID wm   -aim for Phos level 3.5-5.5  -Renal diet    Acid-base  -Stable  -Continue dialysis    Electrolytes  -Monitor  -See dialysis orders  -Replace as needed  -secondary hyperparathyroidism continue Sensipar    Volume status  -Fluid restriction  -Dialysis    Diet   -low sodium diet     Hypertension  -adjust BP meds see orders         MY ORDERS LAST 24 (24h ago, onward)       Start     Ordered    10/20/21 1200  albuterol (PROVENTIL) 2.5 mg / 3 mL (0.083%) neb solution  EVERY 6 HOURS PRN         10/20/21 1159    10/20/21 1200  budesonide-formoterol (SYMBICORT) 160 mcg-4.5 mcg per inhalation oral inhaler - "Respiratory to administer"  2 TIMES DAILY         10/20/21 Newman, MD, FASN, 10/20/2021, 15:18

## 2021-10-20 NOTE — Care Plan (Signed)
Problem: Adult Inpatient Plan of Care  Goal: Plan of Care Review  Outcome: Adequate for Discharge  Goal: Patient-Specific Goal (Individualized)  Outcome: Adequate for Discharge  Goal: Absence of Hospital-Acquired Illness or Injury  Outcome: Adequate for Discharge  Intervention: Prevent Skin Injury  Recent Flowsheet Documentation  Taken 10/20/2021 1400 by Mathis Fare, RN  Body Position: positioned/repositioned independently  Taken 10/20/2021 1200 by Mathis Fare, RN  Body Position: positioned/repositioned independently  Goal: Optimal Comfort and Wellbeing  Outcome: Adequate for Discharge  Goal: Rounds/Family Conference  Outcome: Adequate for Discharge     Problem: Fluid Volume Excess  Goal: Fluid Balance  Outcome: Adequate for Discharge     Problem: Pain Acute  Goal: Optimal Pain Control and Function  Outcome: Adequate for Discharge     Problem: Gas Exchange Impaired  Goal: Optimal Gas Exchange  Outcome: Adequate for Discharge     Problem: Skin Injury Risk Increased  Goal: Skin Health and Integrity  Outcome: Adequate for Discharge

## 2021-10-20 NOTE — Progress Notes (Signed)
PROGRESS NOTE    [name], [name]  Date of Admission:  10/15/2021  Date of Birth:  May 09, 1975  Date of Service:  10/20/2021    Hospital Day:  LOS: 5 days     Subjective:   Patient seen examined at bedside by medical team.  Information obtained from nursing report, EHR review, patient and/or family, if present.     See Assessment/Plan section regarding any significant events overnight.  All systems reviewed and are unremarkable except as noted in HPI and below.    Vital Signs:  Temp (24hrs) Max:35.9 C (96.7 F)      Temperature: (!) 35.9 C (96.7 F)  BP (Non-Invasive): (!) 142/71  MAP (Non-Invasive): 89 mmHG  Heart Rate: 65  Respiratory Rate: 15  SpO2: 100 %    Current Medications:  acetaminophen (TYLENOL) tablet, 650 mg, Oral, Q6H PRN  acetaminophen (TYLENOL) tablet, 650 mg, Oral, Q4H PRN  albuterol (PROVENTIL) 2.5 mg / 3 mL (0.083%) neb solution, 2.5 mg, Nebulization, Q6H PRN  alcohol 62 % (NOZIN NASAL SANITIZER) nasal solution, 1 Each, Each Nostril, 2x/day  amLODIPine (NORVASC) tablet, 10 mg, Oral, Daily  apixaban (ELIQUIS) tablet, 2.5 mg, Oral, 2x/day  aspirin chewable tablet 81 mg, 81 mg, Oral, Daily  atorvastatin (LIPITOR) tablet, 40 mg, Oral, QPM  azithromycin (ZITHROMAX) 500 mg in NS 250 mL IVPB, 500 mg, Intravenous, Q24H  budesonide (PULMICORT RESPULES) 0.5 mg/2 mL nebulizer suspension, 0.5 mg, Nebulization, 2x/day  budesonide-formoterol (SYMBICORT) 160 mcg-4.5 mcg per inhalation oral inhaler - "Respiratory to administer", 2 Puff, Inhalation, 2x/day  calcitriol (ROCALTROL) capsule, 1 mcg, Oral, M, W, and F  calcium acetate (PHOSLO) capsule, 1,334 mg, Oral, 3x/day-Meals  cefTRIAXone (ROCEPHIN) 2 g in NS 50 mL IVPB minibag, 2 g, Intravenous, Q24H  cinacalcet (SENSIPAR) tablet, 30 mg, Oral, QPM  cloNIDine (CATAPRES) tablet, 0.2 mg, Oral, 2x/day  cloNIDine (CATAPRES-TTS) transdermal patch (mg/24 hr), 0.3 mg, Transdermal, Q7 Days  clopidogrel (PLAVIX) 75 mg tablet, 75 mg, Oral, Daily  cyanocobalamin (VITAMIN  B12) tablet, 1,000 mcg, Oral, Daily with Lunch  cyclobenzaprine (FLEXERIL) tablet, 5 mg, Oral, 2x/day PRN  diphenhydrAMINE (BENADRYL) capsule, 50 mg, Oral, Q6H PRN  docusate sodium (COLACE) capsule, 100 mg, Oral, 2x/day  doxazosin (CARDURA) tablet, 4 mg, Oral, QPM  epoetin alfa-epbx (RETACRIT) 10,000 units/mL injection, 10,000 Units, Intravenous, Every MO, WE, and FR Give in Dialysis  epoetin alfa-epbx (RETACRIT) 10,000 units/mL injection, 10,000 Units, Intravenous, Once  famotidine (PEPCID) tablet, 40 mg, Oral, Daily  fluconazole (DIFLUCAN) tablet, 200 mg, Oral, Once  folic acid (FOLVITE) tablet, 1 mg, Oral, Daily  furosemide (LASIX) tablet, 160 mg, Oral, Daily  guaiFENesin (MUCINEX) extended release tablet - for cough (expectorant), 600 mg, Oral, Q12H  hydrALAZINE (APRESOLINE) injection 10 mg, 10 mg, Intravenous, Q6H PRN  hydrALAZINE (APRESOLINE) tablet, 100 mg, Oral, Q8HRS  HYDROmorphone (DILAUDID) 2 mg/mL injection, 1 mg, Intravenous, Q4H PRN  hydrOXYzine pamoate (VISTARIL) capsule, 25 mg, Oral, 4x/day PRN  ipratropium-albuterol 0.5 mg-3 mg(2.5 mg base)/3 mL Solution for Nebulization, 3 mL, Nebulization, 4x/day PRN  isosorbide mononitrate (IMDUR) 24 hr extended release tablet, 120 mg, Oral, QAM  labetalol (NORMODYNE) tablet, 300 mg, Oral, 2x/day  loratadine (CLARITIN) tablet, 10 mg, Oral, Daily  losartan (COZAAR) tablet, 100 mg, Oral, NIGHTLY  methylPREDNISolone sod succ (SOLU-MEDROL) 125 mg/2 mL injection, 62.5 mg, Intravenous, Q6H  midodrine (PROAMITINE) tablet, 5 mg, Oral, DIALYSIS DAILY PRN  montelukast (SINGULAIR) 10 mg tablet, 10 mg, Oral, QPM  morphine 2 mg/mL injection, 2 mg, Intravenous, Q8H PRN  nitroGLYCERIN (NITROSTAT)  sublingual tablet, 0.4 mg, Sublingual, Q5 Min PRN  ondansetron (ZOFRAN) 2 mg/mL injection, 4 mg, Intravenous, Q8H PRN  pantoprazole (PROTONIX) delayed release tablet, 40 mg, Oral, Daily  prochlorperazine (COMPAZINE) 5 mg/mL injection, 10 mg, Intravenous, Q6H PRN  spironolactone  (ALDACTONE) tablet, 50 mg, Oral, 2X/day        Current Orders:  Active Orders   Lab    BASIC METABOLIC PANEL     Frequency: 0530 - AM DRAW     Number of Occurrences: 1 Occurrences    C-REACTIVE PROTEIN (CRP)     Frequency: 0530 - AM DRAW     Number of Occurrences: 1 Occurrences    CBC/DIFF     Frequency: 0530 - AM DRAW     Number of Occurrences: 1 Occurrences    MAGNESIUM     Frequency: 0530 - AM DRAW     Number of Occurrences: 1 Occurrences   Diet    DIET CARDIAC (2G NA, LOWFAT, LOW CHOL) Do you want to initiate MNT Protocol? Yes     Frequency: All Meals     Number of Occurrences: 1 Occurrences   Nursing    ACTIVITY Activity: AS TOLERATED; Instructions: WITH ASSIST     Frequency: UNTIL DISCONTINUED     Number of Occurrences: Until Specified    APIXABAN NURSING ORDER     Frequency: UNTIL DISCONTINUED     Number of Occurrences: Until Specified     Order Comments: Nursing Instructions:    1.  Print apixaban (Eliquis) guide using the link in this order.   2.  Nurse to provide apixaban patient guide to all patients and be sure that all new starts have received education by the trained anticoagulation educator.  If additional questions, contact pharmacy.           CONTINUOUS CARDIAC MONITORING (ED USE ONLY)     Frequency: CONTINUOUS     Number of Occurrences: Until Specified    MISCELLANEOUS MD/DO TO NURSE     Frequency: UNTIL DISCONTINUED     Number of Occurrences: Until Specified     Order Comments: Please change transdermal clonidine patch.  Patient reported this should have been changed on Friday 10/18/2021.      MISCELLANEOUS MD/DO TO NURSE     Frequency: UNTIL DISCONTINUED     Number of Occurrences: Until Specified     Order Comments: Please inform night staff at transfer care to minimize waking the patient up at night as she has not rested well over last couple of nights due to constant interruptions.      NOTIFY MD     Frequency: PRN     Number of Occurrences: Until Specified    Notify MD Vital Signs      Frequency: PRN     Number of Occurrences: Until Specified    PT IS MEDIUM RISK FOR VENOUS THROMBOEMBOLISM     Frequency: CONTINUOUS     Number of Occurrences: Until Specified    PULSE OXIMETRY CONTINUOUS     Frequency: CONTINUOUS     Number of Occurrences: Until Specified    PULSE OXIMETRY Q4H     Frequency: Q4H     Number of Occurrences: Until Specified    TELEMETRY MONITORING - Continuous     Frequency: CONTINUOUS     Number of Occurrences: Until Specified    VITAL SIGNS  Q4H     Frequency: Q4H     Number of Occurrences: Until Specified    VITAL SIGNS MULTI  FREQUENCY     Frequency: UNTIL DISCONTINUED     Number of Occurrences: Until Specified    WAS PATIENT ON APIXABAN PRIOR TO ADMISSION?     Frequency: UNTIL DISCONTINUED     Number of Occurrences: Until Specified    WEIGH PATIENT PRE AND POST DIALYSIS TREATMENT     Frequency: EVERY OTHER DAY     Number of Occurrences: Until Specified    WEIGH PATIENT PRE AND POST DIALYSIS TREATMENT     Frequency: EVERY OTHER DAY     Number of Occurrences: Until Specified    WEIGH PATIENT PRE AND POST DIALYSIS TREATMENT     Frequency: EVERY OTHER DAY     Number of Occurrences: Until Specified   Code Status    FULL CODE     Frequency: CONTINUOUS     Number of Occurrences: Until Specified   Consult    IP CONSULT TO CARDIOLOGY Requested Provider; Anderson Malta A     Frequency: ONE TIME     Number of Occurrences: 1 Occurrences    IP CONSULT TO NEPHROLOGY Requested Provider; ETER, AHMAD     Frequency: ONE TIME     Number of Occurrences: 1 Occurrences   IV    INSERT & MAINTAIN PERIPHERAL IV ACCESS     Frequency: UNTIL DISCONTINUED     Number of Occurrences: Until Specified   Dialysis    HEMODIALYSIS     Frequency: ONE TIME     Number of Occurrences: 1 Occurrences     Order Comments: Dialysis access, use CVC unless patient has AV fistula or AV graft  Heparin:  2000 units at the start of dialysis, as long as hemoglobin above 7, platelets above 5,0000 and No GI Bleed/active bleeding   UF  Goal 1.0- 3.0 L  Contact me for any questions      HEMODIALYSIS     Frequency: ONE TIME     Number of Occurrences: 1 Occurrences     Order Comments: Dialysis access, use CVC unless patient has AV fistula or AV graft  Heparin:  2000 units at the start of dialysis, as long as hemoglobin above 7, platelets above 5,0000 and No GI Bleed/active bleeding   UF Goal 1.0- 3.0 L  Contact me for any questions      HEMODIALYSIS     Frequency: ONE TIME     Number of Occurrences: 1 Occurrences   Medications    acetaminophen (TYLENOL) tablet     Frequency: Q6H PRN     Dose: 650 mg     Route: Oral    acetaminophen (TYLENOL) tablet     Frequency: Q4H PRN     Dose: 650 mg     Route: Oral    albuterol (PROVENTIL) 2.5 mg / 3 mL (0.083%) neb solution     Frequency: Q6H PRN     Dose: 2.5 mg     Route: Nebulization    alcohol 62 % (NOZIN NASAL SANITIZER) nasal solution     Frequency: 2x/day     Dose: 1 Each     Route: Each Nostril    amLODIPine (NORVASC) tablet     Frequency: Daily     Dose: 10 mg     Route: Oral    apixaban (ELIQUIS) tablet     Frequency: 2x/day     Dose: 2.5 mg     Route: Oral    aspirin chewable tablet 81 mg     Frequency: Daily     Dose:  81 mg     Route: Oral    atorvastatin (LIPITOR) tablet     Frequency: QPM     Dose: 40 mg     Route: Oral    azithromycin (ZITHROMAX) 500 mg in NS 250 mL IVPB     Frequency: Q24H     Dose: 500 mg     Route: Intravenous    budesonide (PULMICORT RESPULES) 0.5 mg/2 mL nebulizer suspension     Frequency: 2x/day     Dose: 0.5 mg     Route: Nebulization    budesonide-formoterol (SYMBICORT) 160 mcg-4.5 mcg per inhalation oral inhaler - "Respiratory to administer"     Frequency: 2x/day     Dose: 2 Puff     Route: Inhalation    calcitriol (ROCALTROL) capsule     Frequency: M, W, and F     Dose: 1 mcg     Route: Oral    calcium acetate (PHOSLO) capsule     Frequency: 3x/day-Meals     Dose: 1,334 mg     Route: Oral    cefTRIAXone (ROCEPHIN) 2 g in NS 50 mL IVPB minibag     Frequency: Q24H      Dose: 2 g     Route: Intravenous    cinacalcet (SENSIPAR) tablet     Frequency: QPM     Dose: 30 mg     Route: Oral    cloNIDine (CATAPRES) tablet     Frequency: 2x/day     Dose: 0.2 mg     Route: Oral    cloNIDine (CATAPRES-TTS) transdermal patch (mg/24 hr)     Frequency: Q7 Days     Dose: 0.3 mg     Route: Transdermal    clopidogrel (PLAVIX) 75 mg tablet     Frequency: Daily     Dose: 75 mg     Route: Oral    cyanocobalamin (VITAMIN B12) tablet     Frequency: Daily with Lunch     Dose: 1,000 mcg     Route: Oral    cyclobenzaprine (FLEXERIL) tablet     Frequency: 2x/day PRN     Dose: 5 mg     Route: Oral    diphenhydrAMINE (BENADRYL) capsule     Frequency: Q6H PRN     Dose: 50 mg     Route: Oral    docusate sodium (COLACE) capsule     Frequency: 2x/day     Dose: 100 mg     Route: Oral    doxazosin (CARDURA) tablet     Frequency: QPM     Dose: 4 mg     Route: Oral    epoetin alfa-epbx (RETACRIT) 10,000 units/mL injection     Frequency: Every MO, WE, and FR Give in Dialysis     Dose: 10,000 Units     Route: Intravenous    epoetin alfa-epbx (RETACRIT) 10,000 units/mL injection     Frequency: Once     Dose: 10,000 Units     Route: Intravenous    famotidine (PEPCID) tablet     Frequency: Daily     Dose: 40 mg     Route: Oral    fluconazole (DIFLUCAN) tablet     Frequency: Once     Dose: 200 mg     Route: Oral    folic acid (FOLVITE) tablet     Frequency: Daily     Dose: 1 mg     Route: Oral    furosemide (LASIX) tablet  Frequency: Daily     Dose: 160 mg     Route: Oral    guaiFENesin (MUCINEX) extended release tablet - for cough (expectorant)     Frequency: Q12H     Dose: 600 mg     Route: Oral    hydrALAZINE (APRESOLINE) injection 10 mg     Frequency: Q6H PRN     Dose: 10 mg     Route: Intravenous    hydrALAZINE (APRESOLINE) tablet     Frequency: Q8HRS     Dose: 100 mg     Route: Oral    HYDROmorphone (DILAUDID) 2 mg/mL injection     Frequency: Q4H PRN     Dose: 1 mg     Route: Intravenous    hydrOXYzine pamoate  (VISTARIL) capsule     Frequency: 4x/day PRN     Dose: 25 mg     Route: Oral    ipratropium-albuterol 0.5 mg-3 mg(2.5 mg base)/3 mL Solution for Nebulization     Frequency: 4x/day PRN     Dose: 3 mL     Route: Nebulization    isosorbide mononitrate (IMDUR) 24 hr extended release tablet     Frequency: QAM     Dose: 120 mg     Route: Oral    labetalol (NORMODYNE) tablet     Frequency: 2x/day     Dose: 300 mg     Route: Oral    loratadine (CLARITIN) tablet     Frequency: Daily     Dose: 10 mg     Route: Oral    losartan (COZAAR) tablet     Frequency: NIGHTLY     Dose: 100 mg     Route: Oral    methylPREDNISolone sod succ (SOLU-MEDROL) 125 mg/2 mL injection     Frequency: Q6H     Dose: 62.5 mg     Route: Intravenous    midodrine (PROAMITINE) tablet     Frequency: DIALYSIS DAILY PRN     Dose: 5 mg     Route: Oral     Order Comments: Administer at start of HD.  May repeat the dose after 30 min from start of HD    montelukast (SINGULAIR) 10 mg tablet     Frequency: QPM     Dose: 10 mg     Route: Oral    morphine 2 mg/mL injection     Frequency: Q8H PRN     Dose: 2 mg     Route: Intravenous    nitroGLYCERIN (NITROSTAT) sublingual tablet     Frequency: Q5 Min PRN     Dose: 0.4 mg     Route: Sublingual    ondansetron (ZOFRAN) 2 mg/mL injection     Frequency: Q8H PRN     Dose: 4 mg     Route: Intravenous    pantoprazole (PROTONIX) delayed release tablet     Frequency: Daily     Dose: 40 mg     Route: Oral    prochlorperazine (COMPAZINE) 5 mg/mL injection     Frequency: Q6H PRN     Dose: 10 mg     Route: Intravenous    spironolactone (ALDACTONE) tablet     Frequency: 2X/day     Dose: 50 mg     Route: Oral        Today's Exam     -GENERAL: No acute distress, Resting comfortably   -SKIN: warm and dry.  -CARDIAC: RRR, no MRGs  -LUNGS: Clear to auscultation bilaterally. No  wheezes or rhonchi.  -EXTREMITIES: No pedal edema noted. Distal pulses equal bilaterally.  -NEURO: Cranial nerves grossly intact. No gross focal deficit.      I/O:  I/O last 24 hours:  No intake or output data in the 24 hours ending 10/20/21 1838    I/O current shift:  No intake/output data recorded.    Labs:  CBC:     11.5* (08/27 0407) \   8.7* (08/27 0407) /   357 (08/27 0407)      / 26.5* (08/27 0407) \          BMP:   134* (08/27 0407) 93* (08/27 0407) 66* (08/27 0407)    /     156* (08/27 0407)   4.8 (08/27 0407) 24 (08/27 0407) 9.24* (08/27 0407) \              No results found for this or any previous visit (from the past 24 hour(s)).       Radiology:  No results found.     The laboratory results, imaging results and other diagnostic results were reviewed in the EMR.    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    -10/18/21: chest pain that she reported as "typical for me after dialysis",   -10/19/2021:  positive fecal blood test, therefore Eliquis was held overnight  - 10/20/2021: Patient seen examined at bedside. Patient concerned about the following: " keep on getting woken up at nighttime", " my clonidine patch has not been changed need a new order", and " food here is nasty, would like to change to a cardiac diet".     End-stage renal disease on hemodialysis:    - Nephrology consulted  -Spoke with Nephrology regarding cardiac diet, is okay with this, benefits outweigh the risks at this time, we will go ahead and place order regarding nursing staff to minimize waking during the night, and also placed an order regarding transdermal clonidine patch needs to be replaced.    Anxiety  -continue with vistaril, discuss with pt, she must ask for it, as it's  PRN.    FOBT  -positive FOBT, H/H stable and pt at high risk for VTE d/t severe hx in the past, therefore benefits outweigh risk, if Hgb downtrend, will consult cards regarding RBA with anticogulation.    Acute respiratory failure with hypoxia   -course: etiology likely secondary to COPD exacerbation versus fluid overload. Patient has been noncompliant with hemodialysis.  Abx started on 10/17/21.  - Continue with nebs, Solu-Medrol for COPD exacerbation.  Continue with supplemental oxygen titration.        CAD  -course: recent stent placement on 08/15  -Continue with aspirin and Plavix.  Per records from [facility] patient is a high risk candidate for CABG.      Low back pain w/ abnormal imaging seen on CT scan 08/2021:  suspicious for osteomyelitis - low back MRI was negative    DVT/PE Prophylaxis: Apixaban    Disposition Planning:  Depending on clinical course.     The total amount of time spent on this encounter including review of historical information, labs, direct face-to-face time, discussion of care plan, care coordination and documentation was > 35 minutes.    Gar Ponto, DO  [place] [facility]    Contents of the document, in whole or in part, are completed utilizing M*Modal dictation technology, please forgive any typographical errors that may exist.

## 2021-10-21 ENCOUNTER — Encounter (HOSPITAL_BASED_OUTPATIENT_CLINIC_OR_DEPARTMENT_OTHER): Payer: Self-pay

## 2021-10-21 ENCOUNTER — Other Ambulatory Visit: Payer: Self-pay

## 2021-10-21 ENCOUNTER — Emergency Department (HOSPITAL_BASED_OUTPATIENT_CLINIC_OR_DEPARTMENT_OTHER): Payer: Commercial Managed Care - PPO

## 2021-10-21 ENCOUNTER — Emergency Department (EMERGENCY_DEPARTMENT_HOSPITAL)
Admission: EM | Admit: 2021-10-21 | Discharge: 2021-10-22 | Disposition: A | Discharge status: Home / Self Care | Payer: Commercial Managed Care - PPO | Source: Home / Self Care | Attending: Emergency Medicine | Admitting: Emergency Medicine

## 2021-10-21 DIAGNOSIS — I160 Hypertensive urgency: Secondary | ICD-10-CM

## 2021-10-21 DIAGNOSIS — K5730 Diverticulosis of large intestine without perforation or abscess without bleeding: Secondary | ICD-10-CM

## 2021-10-21 DIAGNOSIS — I7090 Unspecified atherosclerosis: Secondary | ICD-10-CM

## 2021-10-21 DIAGNOSIS — Z992 Dependence on renal dialysis: Secondary | ICD-10-CM | POA: Insufficient documentation

## 2021-10-21 DIAGNOSIS — J45901 Unspecified asthma with (acute) exacerbation: Secondary | ICD-10-CM

## 2021-10-21 DIAGNOSIS — N261 Atrophy of kidney (terminal): Secondary | ICD-10-CM

## 2021-10-21 DIAGNOSIS — R1084 Generalized abdominal pain: Secondary | ICD-10-CM

## 2021-10-21 DIAGNOSIS — K5901 Slow transit constipation: Secondary | ICD-10-CM | POA: Insufficient documentation

## 2021-10-21 DIAGNOSIS — F17210 Nicotine dependence, cigarettes, uncomplicated: Secondary | ICD-10-CM

## 2021-10-21 DIAGNOSIS — R609 Edema, unspecified: Secondary | ICD-10-CM | POA: Insufficient documentation

## 2021-10-21 DIAGNOSIS — N281 Cyst of kidney, acquired: Secondary | ICD-10-CM | POA: Insufficient documentation

## 2021-10-21 MED ORDER — CALCIUM ACETATE(PHOSPHATE BINDERS) 667 MG CAPSULE
1334.0000 mg | ORAL_CAPSULE | Freq: Three times a day (TID) | ORAL | 0 refills | Status: DC
Start: 2021-10-21 — End: 2022-01-03

## 2021-10-21 MED ORDER — HYDRALAZINE 100 MG TABLET
100.0000 mg | ORAL_TABLET | Freq: Three times a day (TID) | ORAL | 0 refills | Status: DC
Start: 2021-10-21 — End: 2022-01-30

## 2021-10-21 MED ORDER — EPOETIN ALFA-EPBX 10,000 UNIT/ML INJECTION SOLUTION
10000.0000 [IU] | INTRAMUSCULAR | Status: DC
Start: 2021-10-21 — End: 2022-01-03

## 2021-10-21 MED ORDER — APIXABAN 2.5 MG TABLET
2.5000 mg | ORAL_TABLET | Freq: Two times a day (BID) | ORAL | 0 refills | Status: DC
Start: 2021-10-21 — End: 2021-11-13

## 2021-10-21 MED ORDER — METHYLPREDNISOLONE SOD SUCCINATE 40 MG/ML SOLUTION FOR INJ. WRAPPER
20.0000 mg | Freq: Four times a day (QID) | INTRAMUSCULAR | Status: DC
Start: 2021-10-21 — End: 2021-10-21
  Administered 2021-10-21: 0 mg via INTRAVENOUS
  Filled 2021-10-21: qty 1

## 2021-10-21 MED ORDER — DOXYCYCLINE HYCLATE 100 MG TABLET
100.0000 mg | ORAL_TABLET | Freq: Two times a day (BID) | ORAL | 0 refills | Status: DC
Start: 2021-10-21 — End: 2021-11-13

## 2021-10-21 MED ORDER — ISOSORBIDE MONONITRATE ER 120 MG TABLET,EXTENDED RELEASE 24 HR
120.0000 mg | ORAL_TABLET | Freq: Every morning | ORAL | 0 refills | Status: DC
Start: 2021-10-22 — End: 2022-01-08

## 2021-10-21 MED ORDER — METHYLPREDNISOLONE 4 MG TABLETS IN A DOSE PACK
ORAL_TABLET | ORAL | 0 refills | Status: DC
Start: 2021-10-21 — End: 2021-11-13

## 2021-10-21 NOTE — Nurses Notes (Signed)
DISCHARGE INFORMATION GIVEN TO PATIENT. ALL QUESTIONS ANSWERED APPROPRIATELY. IV AND TELE REMOVED. ALL BELONGINGS WITH PATIENT.

## 2021-10-21 NOTE — Care Management Notes (Signed)
[facility] Management Note    Patient Name: [name]  Date of Birth: 17-Nov-1975  Sex: female  Date/Time of Admission: 10/15/2021  4:58 PM  Room/Bed: 318/A  Payor: [employer] MEDICARE / Plan: [employer] CHOICE PPO / Product Type: PPO /    LOS: 6 days   Primary Care Providers:  Deel, Leona Carry, FNP, FNP (General)    Admitting Diagnosis:  Hypertensive urgency [I16.0]    Assessment:       Discharge Plan:  Home with DME (code 1)  CM SENT ORDER TO CHOICE VIA [facility] AND FOLLOWED UP WITH DANIELLE.     The patient will continue to be evaluated for developing discharge needs.     Case Manager: Lindell Noe, [place]  Phone: [phone]

## 2021-10-21 NOTE — ED Attending Note (Signed)
[facility] emergency department         HISTORY OF PRESENT ILLNESS     Date:  10/21/2021  Patient's Name:  [name]  Date of Birth:  01-Mar-1975    Patient is a 46 year old hemodialysis patient.  Patient discharged from [facility] today.  Patient had dialysis today.  Patient had a bowel movement at home noted blood in her stool.  She also wiped after urinating and noticed blood.  She denies vomiting blood      Review of Systems     Review of Systems   HENT: Negative.     Eyes: Negative.    Gastrointestinal:  Positive for abdominal pain.   Genitourinary:  Positive for hematuria.   Hematological: Negative.    Psychiatric/Behavioral: Negative.     All other systems reviewed and are negative.    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    Tricuspid valve regurgitation     Uncontrolled hypertension        Past Surgical History:  Past Surgical History:   Procedure Laterality Date    Coronary artery angioplasty      Esophagogastroduodenoscopy      Hx back surgery      Hx cholecystectomy      Hx foot surgery Right     Hx hysterectomy      Hx tonsillectomy         Social History:  Social History     Tobacco Use    Smoking status: Every Day     Packs/day: 1.00     Years: 10.00     Pack years: 10.00     Types: Cigarettes    Smokeless tobacco: Former    Tobacco comments:     "Haven't had a cigerette in 2 weeks."   Vaping Use    Vaping Use: Never used   Substance Use Topics    Alcohol use: Not Currently     Drug use: Yes     Types: Marijuana     Social History     Substance and Sexual Activity   Drug Use Yes    Types: Marijuana       Family History:  Family History   Problem Relation Age of Onset    Diabetes type II Mother     Coronary Artery Disease Mother     Breast Cancer Mother     Hypertension (High Blood Pressure) Father        Medication History:  Current Outpatient Medications   Medication Sig    acetaminophen (TYLENOL) 325 mg Oral Tablet Take 2 Tablets (650 mg total) by mouth Every 4 hours as needed    amLODIPine (NORVASC) 10 mg Oral Tablet Take 1 Tablet (10 mg total) by mouth Once a day Take one tablet every day  apixaban (ELIQUIS) 2.5 mg Oral Tablet Take 1 Tablet (2.5 mg total) by mouth Twice daily for 30 days    budesonide-formoteroL (SYMBICORT) 160-4.5 mcg/actuation Inhalation oral inhaler Take 2 Puffs by inhalation Twice daily    calcitrioL (ROCALTROL) 0.5 mcg Oral Capsule Take 2 Capsules (1 mcg total) by mouth Every Monday, Wednesday and Friday    calcium acetate,phosphat bind, (PHOSLO) 667 mg Oral Capsule Take 2 Capsules (1,334 mg total) by mouth Three times daily with meals for 30 days    cinacalcet (SENSIPAR) 30 mg Oral Tablet Take 1 Tablet (30 mg total) by mouth Every evening    cloNIDine (CATAPRES-TTS) 0.3 mg/24 hr Transdermal Patch Weekly Place 1 Patch (0.3 mg total) on the skin Every 7 days    cloNIDine HCL (CATAPRES) 0.2 mg Oral Tablet Take 1 Tablet (0.2 mg total) by mouth Twice daily Take one tablet twice a day    clopidogreL (PLAVIX) 75 mg Oral Tablet Take 1 Tablet (75 mg total) by mouth Once a day    cyanocobalamin (VITAMIN B 12) 1,000 mcg Oral Tablet Take 1 Tablet (1,000 mcg total) by mouth Once a day with lunch (Patient not taking: Reported on 10/15/2021)    cyclobenzaprine (FLEXERIL) 5 mg Oral Tablet Take 1 Tablet (5 mg total) by mouth Twice per day as needed for Muscle spasms    diphenhydrAMINE (BENADRYL) 50 mg Oral Capsule Take 1 Capsule (50 mg total) by mouth Every 6 hours as needed for  Itching (Patient taking differently: Take 25 mg by mouth Every 6 hours as needed for Itching)    doxazosin (CARDURA) 4 mg Oral Tablet Take 1 Tablet (4 mg total) by mouth Every evening    doxycycline 100 mg Oral Tablet Take 1 Tablet (100 mg total) by mouth Twice daily for 5 days    epoetin alfa-epbx (RETACRIT) 10,000 unit/mL Injection Solution Infuse 1 mL (10,000 Units total) into a venous catheter Give in Dialysis for 30 days    famotidine (PEPCID) 40 mg Oral Tablet Take 1 Tablet (40 mg total) by mouth Once a day Take one tablet once a day    furosemide (LASIX) 80 mg Oral Tablet Take 2 Tablets (160 mg total) by mouth Once a day Take after dialysis on dialysis days; otherwise take in morning daily.    hydrALAZINE (APRESOLINE) 100 mg Oral Tablet Take 1 Tablet (100 mg total) by mouth Every 8 hours for 30 days    ipratropium-albuterol 0.5 mg-3 mg(2.5 mg base)/3 mL Solution for Nebulization Take 3 mL by nebulization Four times a day as needed    isosorbide mononitrate (IMDUR) 120 mg Oral Tablet Sustained Release 24 hr Take 1 Tablet (120 mg total) by mouth Every morning for 30 days    labetaloL (NORMODYNE) 300 mg Oral Tablet Take 1 Tablet (300 mg total) by mouth Twice daily    lactulose (ENULOSE) 10 gram/15 mL Oral Solution Take 30 mL by mouth Once a day for 3 days    losartan (COZAAR) 100 mg Oral Tablet Take 1 Tablet (100 mg total) by mouth Every night Take one tablet everyday at bedtime    Methylprednisolone (MEDROL DOSEPACK) 4 mg Oral Tablets, Dose Pack Take as instructed.    montelukast (SINGULAIR) 10 mg Oral Tablet Take 1 Tablet (10 mg total) by mouth Every evening    oxyCODONE-acetaminophen (PERCOCET) 5-325 mg Oral Tablet Take 1 Tablet by mouth Every 6 hours as needed    pantoprazole (PROTONIX) 40 mg Oral Tablet, Delayed Release (E.C.) Take 1 Tablet (40 mg  total) by mouth Once a day    rosuvastatin (CRESTOR) 40 mg Oral Tablet Take 1 Tablet (40 mg total) by mouth Every evening Take one tablet every day     spironolactone (ALDACTONE) 25 mg Oral Tablet Take 2 Tablets (50 mg total) by mouth Twice daily Patient takes 2 tablets of 25 mg twice a day       Allergies:  Allergies   Allergen Reactions    Lisinopril Swelling     Tongue and throat swelling    Cardene [Nicardipine]  Other Adverse Reaction (Add comment)     Chest pain     Oxycodone Itching       Physical Exam     Vitals:    BP (!) 146/94   Pulse 62   Temp (!) 35.9 C (96.6 F)   Resp 15   Ht 1.702 m (5\' 7" )   Wt 83.9 kg (185 lb)   SpO2 100%   BMI 28.98 kg/m           Physical Exam  Vitals and nursing note reviewed.   Constitutional:       General: She is not in acute distress.     Appearance: She is well-developed.   HENT:      Head: Normocephalic and atraumatic.   Eyes:      Conjunctiva/sclera: Conjunctivae normal.   Cardiovascular:      Rate and Rhythm: Normal rate and regular rhythm.      Heart sounds: No murmur heard.  Pulmonary:      Effort: Pulmonary effort is normal. No respiratory distress.      Breath sounds: Normal breath sounds.   Abdominal:      Palpations: Abdomen is soft.      Tenderness: There is no abdominal tenderness.   Musculoskeletal:         General: No swelling.      Cervical back: Neck supple.   Skin:     General: Skin is warm and dry.      Capillary Refill: Capillary refill takes less than 2 seconds.   Neurological:      Mental Status: She is alert.   Psychiatric:         Mood and Affect: Mood normal.       Diagnostic Studies/Treatment     Medications:       New Prescriptions    LACTULOSE (ENULOSE) 10 GRAM/15 ML ORAL SOLUTION    Take 30 mL by mouth Once a day for 3 days       Labs:    Results for orders placed or performed during the hospital encounter of 10/21/21 (from the past 12 hour(s))   PT/INR   Result Value Ref Range    PROTHROMBIN TIME 12.9 (H) 9.8 - 12.7 seconds    INR 1.11 (H) 0.88 - 1.10   PTT (PARTIAL THROMBOPLASTIN TIME)   Result Value Ref Range    APTT 43.3 (H) 22.0 - 31.7 seconds   BASIC METABOLIC PANEL   Result Value  Ref Range    SODIUM 133 (L) 136 - 145 mmol/L    POTASSIUM 4.4 3.5 - 5.1 mmol/L    CHLORIDE 93 (L) 98 - 107 mmol/L    CO2 TOTAL 27 21 - 32 mmol/L    ANION GAP 13 4 - 13 mmol/L    CALCIUM 8.9 8.5 - 10.1 mg/dL    GLUCOSE 97 74 - 106 mg/dL    BUN 71 (H) 7 - 18 mg/dL  CREATININE 8.17 (H) 0.55 - 1.02 mg/dL    BUN/CREA RATIO 9     ESTIMATED GFR 6 (L) >59 mL/min/1.20m^2    OSMOLALITY, CALCULATED 287 270 - 290 mOsm/kg   CBC WITH DIFF   Result Value Ref Range    WBCS UNCORRECTED      WBC 14.1 (H) 4.0 - 10.5 x10^3/uL    RBC 3.33 (L) 4.20 - 5.40 x10^6/uL    HGB 9.6 (L) 12.5 - 16.0 g/dL    HCT 30.2 (L) 37.0 - 47.0 %    MCV 90.7 78.0 - 99.0 fL    MCH 28.8 27.0 - 32.0 pg    MCHC 31.8 (L) 32.0 - 36.0 g/dL    RDW 22.4 (H) 11.6 - 14.8 %    PLATELETS 412 140 - 440 x10^3/uL    MPV 8.0 7.4 - 10.4 fL   MANUAL DIFFERENTIAL   Result Value Ref Range    WBC 14.1 x10^3/uL    NEUTROPHIL % 80 (H) 40 - 76 %    LYMPHOCYTE % 10 (L) 25 - 45 %    MONOCYTE % 8 0 - 12 %    EOSINOPHIL % 1 0 - 7 %    BASOPHIL %      METAMYELOCYTE %      MYELOCYTE %      PROMYELOCYTE %      BAND % 1 (L) 5 - 11 %    BLAST %      OTHER %      NEUTROPHIL ABSOLUTE 11.42 (H) 1.80 - 8.40 x10^3/uL    LYMPHOCYTE ABSOLUTE 1.41 1.10 - 5.00 x10^3/uL    MONOCYTE ABSOLUTE 1.13 0.00 - 1.30 x10^3/uL    EOSINOPHIL ABSOLUTE 0.14 0.00 - 0.80 x10^3/uL    BASOPHIL ABSOLUTE      METAMYELOCYTE ABSOLUTE      MYELOCYTE ABSOLUTE      PROMYELOCYTE ABSOLUTE      BLAST ABSOLUTE      OTHER CELL ABSOLUTE      ANISOCYTOSIS 2+     POLYCHROMASIA      POIKILOCYTOSIS      BASOPHILIC STIPPLING      MICROCYTOSIS      MACROCYTOSIS      ROULEAUX      SCHISTOCYTES      SPHEROCYTES      TARGET CELLS      TEARDROP CELLS      OVALOCYTE (ELLIPTOCYTE)      CRENATED RED CELLS      STOMATOCYTES      ACANTHOCYTES (SPUR CELL)      ECHINOCYTE (BURR CELL)      BLISTER CELLS      RBC AGGLUTINATES      HOWELL JOLLY BODIES      ATYPICAL LYMPHOCYTES      TOXIC GRANULATION      DOHLE BODIES      TOXIC VACUOLIZATION       AUER RODS      BASKET CELLS      HYPERSEGMENTATION      LARGE PLATELETS      PLATELET CLUMPS      WBC MORPHOLOGY COMMENT      RBC MORPHOLOGY COMMENT      PLATELET MORPHOLOGY COMMENT Adequate     BANDS NEUTROPHILS MANUAL 1     BAND ABSOLUTE      NEUTROPHILS MANUAL 80     LYMPHOCYTES MANUAL 10     MONOCYTES MANUAL 8     EOSINOPHILS MANUAL 1  BASOPHILS MANUAL      PROMYELOCYTES MANUAL      MYELOCYTES MANUAL      METAMYELOCYTES MANUAL      BLASTS MANUAL      TOTAL CELLS COUNTED [#] IN BLOOD 100     OTHER CELLS MANUAL      NUCLEATED RBC MANUAL      PLASMA CELL %      PLASMA CELL ABSOLUE      PLASMA CELLS MANUAL      HYPOCHROMASIA          Radiology:  CT ABDOMEN PELVIS WO IV CONTRAST    CT ABDOMEN PELVIS WO IV CONTRAST   Final Result   1.NO ACUTE FINDINGS INVOLVING THE BOWEL.   2.CHRONIC CONSTIPATION AND COLONIC DIVERTICULOSIS.   3.GENERALIZED SUBCUTANEOUS EDEMA AND PROBABLY SOME MILD EDEMA AT THE LUNG BASES. NO PLEURAL EFFUSIONS OR ASCITES.   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          One or more dose reduction techniques were used (e.g., Automated exposure control, adjustment of the mA and/or kV according to patient size, use of iterative reconstruction technique).         Radiologist location ID: [id number]             ECG:  NONE            Differential diagnosis  Abdominal pain, pelvic pain rectal bleed hematuria    Course/Disposition/Plan     Course:     ED Course as of 10/22/21 0155   Tue Oct 22, 2021   0050 CT showed moderate constipation patient treated with a soapsuds enema in the emergency room       Disposition:    Discharged    Condition at Disposition:    Stable                    Follow up:   Cam Hai, [place]  [place] [address]  [phone]    Schedule an appointment as soon as possible for a visit   If symptoms worsen      Clinical Impression:     Clinical Impression   Generalized abdominal pain (Primary)   Constipation by delayed colonic transit          Winfred Burn, MD

## 2021-10-21 NOTE — Consults (Signed)
[facility]    NEPHROLOGY CONSULTATION NOTE       [name] 46 y.o. female 318/A   Date of Service: 10/21/2021    Date of Admission:  10/15/2021   PCP: Cam Hai, FNP Code Status:Full Code       Reason for Consultation:  End-stage renal disease on hemodialysis MWF     HPI:   46 year old female with past medical history of end-stage renal disease on hemodialysis Mondays Wednesdays and Fridays missed dialysis on Monday due to issues with her oxygen delivery was delayed on Monday.    Patient reports today that she is short of breath she denies nausea or vomiting but reports decrease in appetite she also reports that her body all is hurting, she reported some bleeding from her nose.    Today:  Patient was seen on dialysis.  Discussed with the patient the importance of fluid restriction low-sodium diet, patient verbalized that she is concerned about bruising easily and concerned about the blood in her stool, discussed with her that we will continue Eliquis and Plavix due to these concerns and risks and we will hold aspirin.  Discussed the plan of care also with the hospitalist.      ROS:   Systematic review of 12 organ systems was negative except what mentioned in in the HPI.        Current medications   acetaminophen (TYLENOL) tablet, 650 mg, Oral, Q6H PRN  acetaminophen (TYLENOL) tablet, 650 mg, Oral, Q4H PRN  albuterol (PROVENTIL) 2.5 mg / 3 mL (0.083%) neb solution, 2.5 mg, Nebulization, Q6H PRN  alcohol 62 % (NOZIN NASAL SANITIZER) nasal solution, 1 Each, Each Nostril, 2x/day  amLODIPine (NORVASC) tablet, 10 mg, Oral, Daily  apixaban (ELIQUIS) tablet, 2.5 mg, Oral, 2x/day  aspirin chewable tablet 81 mg, 81 mg, Oral, Daily  atorvastatin (LIPITOR) tablet, 40 mg, Oral, QPM  azithromycin (ZITHROMAX) 500 mg in NS 250 mL IVPB, 500 mg, Intravenous, Q24H  budesonide-formoterol (SYMBICORT) 160 mcg-4.5 mcg per inhalation oral inhaler - "Respiratory to administer", 2 Puff, Inhalation,  2x/day  calcitriol (ROCALTROL) capsule, 1 mcg, Oral, M, W, and F  calcium acetate (PHOSLO) capsule, 1,334 mg, Oral, 3x/day-Meals  cefTRIAXone (ROCEPHIN) 2 g in NS 50 mL IVPB minibag, 2 g, Intravenous, Q24H  cinacalcet (SENSIPAR) tablet, 30 mg, Oral, QPM  cloNIDine (CATAPRES) tablet, 0.2 mg, Oral, 2x/day  cloNIDine (CATAPRES-TTS) transdermal patch (mg/24 hr), 0.3 mg, Transdermal, Q7 Days  clopidogrel (PLAVIX) 75 mg tablet, 75 mg, Oral, Daily  cyanocobalamin (VITAMIN B12) tablet, 1,000 mcg, Oral, Daily with Lunch  cyclobenzaprine (FLEXERIL) tablet, 5 mg, Oral, 2x/day PRN  diphenhydrAMINE (BENADRYL) capsule, 50 mg, Oral, Q6H PRN  docusate sodium (COLACE) capsule, 100 mg, Oral, 2x/day  doxazosin (CARDURA) tablet, 4 mg, Oral, QPM  epoetin alfa-epbx (RETACRIT) 10,000 units/mL injection, 10,000 Units, Intravenous, Every MO, WE, and FR Give in Dialysis  epoetin alfa-epbx (RETACRIT) 10,000 units/mL injection, 10,000 Units, Intravenous, Once  famotidine (PEPCID) tablet, 40 mg, Oral, Daily  fluconazole (DIFLUCAN) tablet, 200 mg, Oral, Once  folic acid (FOLVITE) tablet, 1 mg, Oral, Daily  furosemide (LASIX) tablet, 160 mg, Oral, Daily  guaiFENesin (MUCINEX) extended release tablet - for cough (expectorant), 600 mg, Oral, Q12H  hydrALAZINE (APRESOLINE) injection 10 mg, 10 mg, Intravenous, Q6H PRN  hydrALAZINE (APRESOLINE) tablet, 100 mg, Oral, Q8HRS  HYDROmorphone (DILAUDID) 2 mg/mL injection, 1 mg, Intravenous, Q4H PRN  hydrOXYzine pamoate (VISTARIL) capsule, 25 mg, Oral, 4x/day PRN  ipratropium-albuterol 0.5 mg-3 mg(2.5 mg base)/3 mL  Solution for Nebulization, 3 mL, Nebulization, 4x/day PRN  isosorbide mononitrate (IMDUR) 24 hr extended release tablet, 120 mg, Oral, QAM  labetalol (NORMODYNE) tablet, 300 mg, Oral, 2x/day  loratadine (CLARITIN) tablet, 10 mg, Oral, Daily  losartan (COZAAR) tablet, 100 mg, Oral, NIGHTLY  methylPREDNISolone sod succ (SOLU-MEDROL) 125 mg/2 mL injection, 62.5 mg, Intravenous, Q6H  midodrine  (PROAMITINE) tablet, 5 mg, Oral, DIALYSIS DAILY PRN  montelukast (SINGULAIR) 10 mg tablet, 10 mg, Oral, QPM  morphine 2 mg/mL injection, 2 mg, Intravenous, Q8H PRN  nitroGLYCERIN (NITROSTAT) sublingual tablet, 0.4 mg, Sublingual, Q5 Min PRN  ondansetron (ZOFRAN) 2 mg/mL injection, 4 mg, Intravenous, Q8H PRN  pantoprazole (PROTONIX) delayed release tablet, 40 mg, Oral, Daily  prochlorperazine (COMPAZINE) 5 mg/mL injection, 10 mg, Intravenous, Q6H PRN  spironolactone (ALDACTONE) tablet, 50 mg, Oral, 2X/day            Physical:  Filed Vitals:    10/20/21 2251 10/21/21 0700 10/21/21 0818 10/21/21 1045   BP:   (!) 144/95    Pulse: 61  62 60   Resp:   16    Temp:   (!) 35.7 C (96.3 F)    SpO2:  100% 100%       No intake or output data in the 24 hours ending 10/21/21 1150       Patient is NAD   PermCath in place  Cardiovascular system: Regular rate and rhythm no murmurs rubs or gallops. No chest wall tenderness  Lungs:  Breath sounds diminished bilaterally.   Abdomen soft nontender nondistended.  Extremities no clubbing cyanosis or edema   Neuro exam: EOMI, normal speech    Labs:  CBC:       \     /          /   \          BMP:            /               \                 Diagnostic studies:  Imaging:   ECG 12 LEAD  Sinus rhythm with 1st degree AV block  Minimal voltage criteria for LVH, may be normal variant ( Cornell product )  Prolonged QT  Abnormal ECG  Confirmed by Anderson Malta ([id number]) on 10/19/2021 4:07:20 PM      Assessments:  Active Hospital Problems   (*Primary Problem)    Diagnosis    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)    H/O heart artery stent    Hypertensive urgency    Acute respiratory failure with hypoxia (CMS HCC)    Fluid overload    Anemia in chronic kidney disease    End-stage renal disease on hemodialysis (CMS HCC)       Plan:         End-stage renal disease on hemodialysis  -Continue dialysis 3 times a week on MWF  -please refer to dialysis orders    Anemia  -on Eliquis due to PE  - on dual  antiplatelets due to coronary artery disease, hold aspirin continue Eliquis  -Monitor  -Epogen      Heart disease  -we will need outpatient follow-up she may need open heart surgery continue current blood thinners.  -keep hemoglobin close to 10.  -continue good management for blood pressure.    CKD MBD  -Phosphorus 6.5  -Auryxia 3 tab TID wm   -  aim for Phos level 3.5-5.5  -Renal diet    Acid-base  -Stable  -Continue dialysis    Electrolytes  -Monitor  -See dialysis orders  -Replace as needed  -secondary hyperparathyroidism continue Sensipar    Volume status  -Fluid restriction  -Dialysis    Diet   -low sodium diet     Hypertension  -BP is well controlled.       Discussed the plan of care with the hospitalist team and the charge nurse.  I appreciate the assistance please contact me for any questions.      MY ORDERS LAST 24 (24h ago, onward)       Start     Ordered    10/23/21 0800  HEMODIALYSIS  Every Mon,Wed, Fri (0800),   Status:  Canceled       10/21/21 0803    10/21/21 0930  HEMODIALYSIS  Every Mon,Wed, Fri (0800)       10/21/21 1423    10/20/21 1200  albuterol (PROVENTIL) 2.5 mg / 3 mL (0.083%) neb solution  EVERY 6 HOURS PRN         10/20/21 1159    10/20/21 1200  budesonide-formoterol (SYMBICORT) 160 mcg-4.5 mcg per inhalation oral inhaler - "Respiratory to administer"  2 TIMES DAILY,   Status:  Discontinued         10/20/21 1159                                Beather Arbour, MD, FASN, 10/21/2021, 11:50

## 2021-10-21 NOTE — Care Management Notes (Signed)
[facility] Management Note    Patient Name: [name]  Date of Birth: 12-09-1975  Sex: female  Date/Time of Admission: 10/15/2021  4:58 PM  Room/Bed: 318/A  Payor: [employer] MEDICARE / Plan: [employer] CHOICE PPO / Product Type: PPO /    LOS: 6 days   Primary Care Providers:  Deel, Leona Carry, FNP, FNP (General)    Admitting Diagnosis:  Hypertensive urgency [I16.0]    Assessment:       Discharge Plan:  Home with DME (code 1)  D/C ORDER IN, DR ASKS CM IF PT HAS HOME 02 AND NEBULIZER FOR DUONEB. PT HAS 02 THROUGH [facility], CM WILL ASK PT ABOUT NEB. CM SPOKE WITH PT WHO STATES THAT HER NEB IS 46 YEARS OLD AND DOESN'T WORK WELL. CM PENDED ORDER AND NOTIFIED DR. CM SENT INFO MINUS ORDER TO DANIELLE, [facility], AND FOLLOWED UP WITH DANIELLE. CM LEFT VM FOR [facility] [employer] AMANDA [phone] ASKING IF THEY NEED INFO FROM HOSPITAL STAY. CM UPDATED PT'S NURSE REGARDING NEBULIZER NEEDED BEFORE D/C.     The patient will continue to be evaluated for developing discharge needs.     Case Manager: Lindell Noe, [place]  Phone: [phone]

## 2021-10-21 NOTE — Nurses Notes (Signed)
PATIENT ON ROOM AIR. OXYGEN SATURATION HAS BEEN BETWEEN 97-100% WHILE AT REST.

## 2021-10-21 NOTE — Nurses Notes (Signed)
1946 Patient called for RN for water  1949 Patient called for RN requesting something for bowel stimulation. Informed of dulcolax in evening meds.  1950 Patient requested RN to know if medications have been administered.   2020 Patient requested RN to administer pain medication.   2050 RN entered room and placed NC in nares spo2 82%  2110 Patient requested assistance to bathroom  2125 Patient requests coffee.   2135 Patient requests prune juice  2137 Patient requests enema. Provider notified no orders received.   2245 Patient requested assistance to bathroom  2258 Patient requested assistance to bathroom and water.   2300 Patient requests enema   0038 Patient requests nurse. Asleep before RN entered room  0045 Patient requests something for anxiety.   0130 Patient requests pain medicine. Asleep before RN entered room  [phone] Patient requests water.

## 2021-10-21 NOTE — Discharge Summary (Signed)
[facility] SUMMARY    PATIENT NAME:  [name], [name]  MRN:  [id number]  DOB:  December 28, 1975    ENCOUNTER DATE:  10/15/2021  INPATIENT ADMISSION DATE: 10/15/2021  DISCHARGE DATE:  10/21/2021    ATTENDING PHYSICIAN: Carron Brazen, DO  SERVICE: PRN HOSPITALIST 5  PRIMARY CARE PHYSICIAN: Cam Hai, FNP       No lay caregiver identified.    PRIMARY DISCHARGE DIAGNOSIS:    Active Hospital Problems    Diagnosis Date Noted    Acute exacerbation of COPD with asthma (CMS [facility]) [J44.1, X32.440] 10/16/2021    H/O heart artery stent [Z95.5] 10/16/2021    Hypertensive urgency [I16.0] 09/29/2021    Acute respiratory failure with hypoxia (CMS [facility]) [J96.01] 08/28/2021    Fluid overload [E87.70] 08/11/2021    Anemia in chronic kidney disease [N18.9, D63.1] 08/05/2021    End-stage renal disease on hemodialysis (CMS HCC) [N18.6, Z99.2] 03/02/2013      Resolved Hospital Problems   No resolved problems to display.     Active Non-Hospital Problems    Diagnosis Date Noted    Coronary artery disease involving native coronary artery 07/01/2021    Noncompliance 07/01/2021    Tobacco dependence 12/04/2020    Insomnia 12/04/2020           Current Discharge Medication List        START taking these medications.        Details   calcium acetate(phosphat bind) 667 mg Capsule  Commonly known as: PHOSLO   1,334 mg, Oral, 3 TIMES DAILY WITH MEALS  Qty: 180 Capsule  Refills: 0     doxycycline 100 mg Tablet   100 mg, Oral, 2 TIMES DAILY  Qty: 10 Tablet  Refills: 0     Methylprednisolone 4 mg Tablets, Dose Pack  Commonly known as: MEDROL DOSEPACK   Take as instructed.  Qty: 21 Tablet  Refills: 0            CONTINUE these medications which have CHANGED during your visit.        Details   hydrALAZINE 100 mg Tablet  Commonly known as: APRESOLINE  What changed: when to take this   100 mg, Oral, EVERY 8 HOURS (SCHEDULED)  Qty: 90 Tablet  Refills: 0     labetaloL 300 mg Tablet  Commonly known as: NORMODYNE  What changed: Another  medication with the same name was removed. Continue taking this medication, and follow the directions you see here.   300 mg, Oral, 2 TIMES DAILY  Refills: 0            CONTINUE these medications - NO CHANGES were made during your visit.        Details   acetaminophen 325 mg Tablet  Commonly known as: TYLENOL   650 mg, Oral, EVERY 4 HOURS PRN  Refills: 0     amLODIPine 10 mg Tablet  Commonly known as: NORVASC   10 mg, Oral, DAILY, Take one tablet every day  Refills: 0     apixaban 2.5 mg Tablet  Commonly known as: ELIQUIS   2.5 mg, Oral, 2 TIMES DAILY  Qty: 60 Tablet  Refills: 0     budesonide-formoteroL 160-4.5 mcg/actuation oral inhaler  Commonly known as: SYMBICORT   2 Puffs, Inhalation, 2 TIMES DAILY  Refills: 0     calcitrioL 0.5 mcg Capsule  Commonly known as: ROCALTROL   1 mcg, Oral, EVERY MO, WE AND FR  Refills: 0  cinacalcet 30 mg Tablet  Commonly known as: SENSIPAR   30 mg, Oral, EVERY EVENING  Refills: 0     cloNIDine 0.3 mg/24 hr Patch Weekly  Commonly known as: CATAPRES-TTS   0.3 mg, Transdermal, EVERY 7 DAYS  Refills: 0     cloNIDine HCL 0.2 mg Tablet  Commonly known as: CATAPRES   0.2 mg, Oral, 2 TIMES DAILY, Take one tablet twice a day  Refills: 0     clopidogreL 75 mg Tablet  Commonly known as: PLAVIX   75 mg, Oral, DAILY  Refills: 0     cyclobenzaprine 5 mg Tablet  Commonly known as: FLEXERIL   5 mg, Oral, 2 TIMES DAILY PRN  Refills: 0     diphenhydrAMINE 50 mg Capsule  Commonly known as: BENADRYL   50 mg, Oral, EVERY 6 HOURS PRN  Refills: 0     doxazosin 4 mg Tablet  Commonly known as: CARDURA   4 mg, Oral, EVERY EVENING  Refills: 0     epoetin alfa-epbx 10,000 unit/mL Solution  Commonly known as: RETACRIT   10,000 Units, Intravenous, GIVE IN DIALYSIS  Refills: 0     famotidine 40 mg Tablet  Commonly known as: PEPCID   40 mg, Oral, DAILY, Take one tablet once a day  Refills: 0     furosemide 80 mg Tablet  Commonly known as: LASIX   160 mg, Oral, DAILY, Take after dialysis on dialysis days;  otherwise take in morning daily.   Refills: 0     ipratropium-albuteroL 0.5 mg-3 mg(2.5 mg base)/3 mL nebulizer solution  Commonly known as: DUONEB   3 mL, Nebulization, 4 TIMES DAILY PRN  Refills: 0     Isosorbide Mononitrate 120 mg Tablet Sustained Release 24 hr  Commonly known as: IMDUR  Start taking on: October 22, 2021   120 mg, Oral, EVERY MORNING  Qty: 30 Tablet  Refills: 0     losartan 100 mg Tablet  Commonly known as: COZAAR   100 mg, Oral, NIGHTLY, Take one tablet everyday at bedtime  Refills: 0     montelukast 10 mg Tablet  Commonly known as: SINGULAIR   10 mg, Oral, EVERY EVENING  Refills: 0     oxyCODONE-acetaminophen 5-325 mg Tablet  Commonly known as: PERCOCET   1 Tablet, Oral, EVERY 6 HOURS PRN  Refills: 0     pantoprazole 40 mg Tablet, Delayed Release (E.C.)  Commonly known as: PROTONIX   40 mg, Oral, DAILY  Refills: 0     rosuvastatin 40 mg Tablet  Commonly known as: CRESTOR   40 mg, Oral, EVERY EVENING, Take one tablet every day  Refills: 0     spironolactone 25 mg Tablet  Commonly known as: ALDACTONE   50 mg, Oral, 2 TIMES DAILY, Patient takes 2 tablets of 25 mg twice a day  Refills: 0            STOP taking these medications.      aspirin 81 mg Tablet, Chewable     dilTIAZem 180 mg Capsule, Sust. Release 24 hr  Commonly known as: CARDIZEM CD     folic acid 1 mg Tablet  Commonly known as: FOLVITE     guaiFENesin 600 mg Tablet Extended Release 12hr  Commonly known as: MUCINEX     HYDROmorphone 2 mg/mL Syringe  Commonly known as: DILAUDID     iron sucrose 100 mg iron/5 mL Solution  Commonly known as: VENOFER     loratadine 10 mg  Tablet  Commonly known as: CLARITIN     nitroGLYCERIN in 5 % dextrose 50 mg/250 mL (200 mcg/mL) Solution     ondansetron 2 mg/mL Solution  Commonly known as: ZOFRAN            ASK your doctor about these medications.        Details   cyanocobalamin 1,000 mcg Tablet  Commonly known as: VITAMIN B 12   1,000 mcg, Oral, DAILY WITH LUNCH  Refills: 0            Discharge med list  refreshed?  YES     Allergies   Allergen Reactions    Lisinopril Swelling     Tongue and throat swelling    Cardene [Nicardipine]  Other Adverse Reaction (Add comment)     Chest pain     Oxycodone Itching     HOSPITAL PROCEDURE(S):   No orders of the defined types were placed in this encounter.    REASON FOR HOSPITALIZATION AND HOSPITAL COURSE   BRIEF HPI:  This is a 46 y.o., female admitted for acute respiratory failure with hypoxia, COPD exacerbation, fluid overload, end-stage renal disease on hemodialysis, coronary artery disease with recent PCI in 8/15 and stent placement.  BRIEF HOSPITAL NARRATIVE:  See H and P for admission details    Acute respiratory failure with hypoxia likely secondary to COPD exacerbation versus fluid overload: Patient placed on supplemental oxygen therapy.  Patient also placed on titrating doses of IV Solu-Medrol, IV antibiotics and DuoNebs.  Patient is chronically on oxygen at home at 2 L continuously.  During hospitalization patient required anywhere from 2 and half to 4 L oxygen via nasal cannula.  She will be discharged on her home oxygen dose.  Viral respiratory panel negative.  Blood cultures negative.    Elevated troponins with history of coronary artery disease and recent PCI on 08/15 with stent placement:  See records from [facility].  Cardiology consulted, see consult note for further details.  Patient was thought to be a candidate for CABG but due to her multiple risk factors, she has been deemed to be a high risk for CABG surgery.  Continue with medical management and outpatient follow-up.  Patient will be continued on Eliquis and Plavix as well as her blood pressure medications.  Patient also started on Imdur.    End-stage renal disease on hemodialysis:  Nephrology consulted, see consult note for further details.    Hypertensive urgency:  Patient did not require any IV antihypertensive medications.  She was continued on her p.o. regimen and blood pressure improved.   Patient's uncontrolled blood pressures likely secondary to noncompliance.    CONDITION ON DISCHARGE:  A. Ambulation: Full ambulation  B. Self-care Ability: Complete  C. Cognitive Status Alert and Oriented x 3  D. Code status at discharge:       LINES/DRAINS/WOUNDS AT DISCHARGE:   Patient Lines/Drains/Airways Status       Active Line / Dialysis Catheter / Dialysis Graft / Drain / Airway / Wound       Name Placement date Placement time Site Days    AV Fistula Left;Upper Arm 12/05/20  1502  -- 319    Dialysis Catheter Cuffed/Tunneled 08/13/21  1316  -- 69    Dialysis Catheter --  --  -- --                    DISCHARGE DISPOSITION:  Home discharge  DISCHARGE INSTRUCTIONS:  DME - NEBULIZER     Current Attending: Caliegh Middlekauff    I certify this pt is under my care as an attending physician & that I, or a collaborating ANP/PA had a face to face encounter with the pt or the durable medical power of attorney, as appropriate and explained the need for DME on the following date: 10/21/2021    The primary diagnosis as discussed in the face to face encounter justifying the need for home health services/DME is as follows: COPD    I certify that a nebulizer is necessary due to Obstructive Pulmonary Disease - Albuterol,Arformoterol,Budesonide,Cromolyn,Formoterol,Ipratropium,Levalbuterol,Metaproterenol    Start Date 10/21/2021    Freedom of Choice: I have informed patient of their freedom of choice with respect to DME providers           Carron Brazen, DO    Copies sent to Care Team         Relationship Specialty Notifications Start End    Deel, Leona Carry, FNP PCP - General NURSE PRACTITIONER  12/04/20     Phone: [phone] Fax: [phone]         [address] [place] [address]            Referring providers can utilize [email or web address] to access their referred [facility] patient's information.

## 2021-10-21 NOTE — ED Triage Notes (Signed)
Pt was DC from [facility] about 6 hours ago. Pt states she is having ABD pain during admission. Pt had a bowel movement today after DC - blood noted in stool. Pt also states she is having vaginal bleeding after hysterectomy. Cont. 2LNC.

## 2021-10-22 LAB — BASIC METABOLIC PANEL
ANION GAP: 13 mmol/L (ref 4–13)
BUN/CREA RATIO: 9
BUN: 71 mg/dL — ABNORMAL HIGH (ref 7–18)
CALCIUM: 8.9 mg/dL (ref 8.5–10.1)
CHLORIDE: 93 mmol/L — ABNORMAL LOW (ref 98–107)
CO2 TOTAL: 27 mmol/L (ref 21–32)
CREATININE: 8.17 mg/dL — ABNORMAL HIGH (ref 0.55–1.02)
ESTIMATED GFR: 6 mL/min/{1.73_m2} — ABNORMAL LOW (ref >59)
GLUCOSE: 97 mg/dL (ref 74–106)
OSMOLALITY, CALCULATED: 287 mOsm/kg (ref 270–290)
POTASSIUM: 4.4 mmol/L (ref 3.5–5.1)
SODIUM: 133 mmol/L — ABNORMAL LOW (ref 136–145)

## 2021-10-22 LAB — MANUAL DIFFERENTIAL
BAND %: 1 % — ABNORMAL LOW (ref 5–11)
BANDS NEUTROPHILS MANUAL: 1
EOSINOPHIL %: 1 % (ref 0–7)
EOSINOPHIL ABSOLUTE: 0.14 10*3/uL (ref 0.00–0.80)
EOSINOPHILS MANUAL: 1
LYMPHOCYTE %: 10 % — ABNORMAL LOW (ref 25–45)
LYMPHOCYTE ABSOLUTE: 1.41 10*3/uL (ref 1.10–5.00)
LYMPHOCYTES MANUAL: 10
MONOCYTE %: 8 % (ref 0–12)
MONOCYTE ABSOLUTE: 1.13 10*3/uL (ref 0.00–1.30)
MONOCYTES MANUAL: 8
NEUTROPHIL %: 80 % — ABNORMAL HIGH (ref 40–76)
NEUTROPHIL ABSOLUTE: 11.42 10*3/uL — ABNORMAL HIGH (ref 1.80–8.40)
NEUTROPHILS MANUAL: 80
PLATELET MORPHOLOGY COMMENT: ADEQUATE
TOTAL CELLS COUNTED [#] IN BLOOD: 100
WBC: 14.1 10*3/uL

## 2021-10-22 LAB — CBC WITH DIFF
HCT: 30.2 % — ABNORMAL LOW (ref 37.0–47.0)
HGB: 9.6 g/dL — ABNORMAL LOW (ref 12.5–16.0)
MCH: 28.8 pg (ref 27.0–32.0)
MCHC: 31.8 g/dL — ABNORMAL LOW (ref 32.0–36.0)
MCV: 90.7 fL (ref 78.0–99.0)
MPV: 8 fL (ref 7.4–10.4)
PLATELETS: 412 10*3/uL (ref 140–440)
RBC: 3.33 10*6/uL — ABNORMAL LOW (ref 4.20–5.40)
RDW: 22.4 % — ABNORMAL HIGH (ref 11.6–14.8)
WBC: 14.1 10*3/uL — ABNORMAL HIGH (ref 4.0–10.5)

## 2021-10-22 LAB — PTT (PARTIAL THROMBOPLASTIN TIME): APTT: 43.3 seconds — ABNORMAL HIGH (ref 22.0–31.7)

## 2021-10-22 LAB — PT/INR
INR: 1.11 — ABNORMAL HIGH (ref 0.88–1.10)
PROTHROMBIN TIME: 12.9 seconds — ABNORMAL HIGH (ref 9.8–12.7)

## 2021-10-22 MED ORDER — LACTULOSE 10 GRAM/15 ML ORAL SOLUTION
30.0000 mL | Freq: Every day | ORAL | 0 refills | Status: DC
Start: 2021-10-22 — End: 2021-11-13

## 2021-10-22 NOTE — ED Nurses Note (Signed)
Pt states she feels much better than when arriving. Symptoms have improved.

## 2021-10-22 NOTE — ED Nurses Note (Signed)
Patient discharged home with family.  AVS reviewed with patient/care giver.  A written copy of the AVS and discharge instructions was given to the patient/care giver. Scripts sent to pharmacy on file. Questions sufficiently answered as needed.  Patient/care giver encouraged to follow up with PCP as indicated.  In the event of an emergency, patient/care giver instructed to call 911 or go to the nearest emergency room.

## 2021-10-22 NOTE — ED Nurses Note (Signed)
Pt ready for discharge but does not have transportation at this time. Pt to remain in room until able to obtain transportation.

## 2021-10-22 NOTE — ED Nurses Note (Signed)
No vaginal bleeding noted upon inspection; pt unable to urinate; pt states she had dialysis today and has not had much to drink.

## 2021-10-22 NOTE — ED Nurses Note (Signed)
Instilled partial enema per pt tolerance; pt had large brown , semi- formed BM; no blood noted.

## 2021-10-22 NOTE — ED Nurses Note (Signed)
Pt verbalized understanding of discharge instructions. Denies any questions at this time.

## 2021-10-24 ENCOUNTER — Other Ambulatory Visit: Payer: Self-pay

## 2021-10-24 ENCOUNTER — Emergency Department (HOSPITAL_BASED_OUTPATIENT_CLINIC_OR_DEPARTMENT_OTHER): Payer: Commercial Managed Care - PPO

## 2021-10-24 ENCOUNTER — Emergency Department
Admission: EM | Admit: 2021-10-24 | Discharge: 2021-10-24 | Disposition: A | Discharge status: Home / Self Care | Payer: Commercial Managed Care - PPO | Attending: Emergency Medicine | Admitting: Emergency Medicine

## 2021-10-24 ENCOUNTER — Encounter (HOSPITAL_BASED_OUTPATIENT_CLINIC_OR_DEPARTMENT_OTHER): Payer: Self-pay

## 2021-10-24 DIAGNOSIS — K5900 Constipation, unspecified: Secondary | ICD-10-CM | POA: Insufficient documentation

## 2021-10-24 DIAGNOSIS — R0789 Other chest pain: Secondary | ICD-10-CM

## 2021-10-24 DIAGNOSIS — I7090 Unspecified atherosclerosis: Secondary | ICD-10-CM | POA: Insufficient documentation

## 2021-10-24 DIAGNOSIS — F17210 Nicotine dependence, cigarettes, uncomplicated: Secondary | ICD-10-CM | POA: Insufficient documentation

## 2021-10-24 DIAGNOSIS — R079 Chest pain, unspecified: Secondary | ICD-10-CM

## 2021-10-24 DIAGNOSIS — Z992 Dependence on renal dialysis: Secondary | ICD-10-CM | POA: Insufficient documentation

## 2021-10-24 DIAGNOSIS — R1084 Generalized abdominal pain: Secondary | ICD-10-CM | POA: Insufficient documentation

## 2021-10-24 DIAGNOSIS — K5901 Slow transit constipation: Secondary | ICD-10-CM

## 2021-10-24 LAB — COMPREHENSIVE METABOLIC PANEL, NON-FASTING
ALBUMIN/GLOBULIN RATIO: 0.8 (ref 0.8–1.4)
ALBUMIN: 3.3 g/dL — ABNORMAL LOW (ref 3.4–5.0)
ALKALINE PHOSPHATASE: 76 U/L (ref 46–116)
ALT (SGPT): 6 U/L (ref <=78)
ANION GAP: 12 mmol/L (ref 4–13)
AST (SGOT): 17 U/L (ref 15–37)
BILIRUBIN TOTAL: 0.5 mg/dL (ref 0.2–1.0)
BUN/CREA RATIO: 8
BUN: 69 mg/dL — ABNORMAL HIGH (ref 7–18)
CALCIUM, CORRECTED: 9.8 mg/dL
CALCIUM: 9.1 mg/dL (ref 8.5–10.1)
CHLORIDE: 94 mmol/L — ABNORMAL LOW (ref 98–107)
CO2 TOTAL: 30 mmol/L (ref 21–32)
CREATININE: 8.42 mg/dL — ABNORMAL HIGH (ref 0.55–1.02)
ESTIMATED GFR: 5 mL/min/{1.73_m2} — ABNORMAL LOW (ref >59)
GLOBULIN: 4
GLUCOSE: 101 mg/dL (ref 74–106)
OSMOLALITY, CALCULATED: 292 mOsm/kg — ABNORMAL HIGH (ref 270–290)
POTASSIUM: 4.7 mmol/L (ref 3.5–5.1)
PROTEIN TOTAL: 7.3 g/dL (ref 6.4–8.2)
SODIUM: 136 mmol/L (ref 136–145)

## 2021-10-24 LAB — CBC WITH DIFF
BASOPHIL #: 0 10*3/uL (ref 0.00–0.30)
BASOPHIL %: 0 % (ref 0–3)
EOSINOPHIL #: 0.2 10*3/uL (ref 0.00–0.80)
EOSINOPHIL %: 2 % (ref 0–7)
HCT: 26.9 % — ABNORMAL LOW (ref 37.0–47.0)
HGB: 8.8 g/dL — ABNORMAL LOW (ref 12.5–16.0)
LYMPHOCYTE #: 1.6 10*3/uL (ref 1.10–5.00)
LYMPHOCYTE %: 12 % — ABNORMAL LOW (ref 25–45)
MCH: 28.9 pg (ref 27.0–32.0)
MCHC: 32.9 g/dL (ref 32.0–36.0)
MCV: 87.9 fL (ref 78.0–99.0)
MONOCYTE #: 1.6 10*3/uL — ABNORMAL HIGH (ref 0.00–1.30)
MONOCYTE %: 11 % (ref 0–12)
MPV: 6.9 fL — ABNORMAL LOW (ref 7.4–10.4)
NEUTROPHIL #: 10.3 10*3/uL — ABNORMAL HIGH (ref 1.80–8.40)
NEUTROPHIL %: 75 % (ref 40–76)
PLATELETS: 407 10*3/uL (ref 140–440)
RBC: 3.06 10*6/uL — ABNORMAL LOW (ref 4.20–5.40)
RDW: 22.1 % — ABNORMAL HIGH (ref 11.6–14.8)
WBC: 13.7 10*3/uL — ABNORMAL HIGH (ref 4.0–10.5)
WBCS UNCORRECTED: 13.7 10*3/uL

## 2021-10-24 LAB — TROPONIN-I
TROPONIN I: 82 ng/L — ABNORMAL HIGH (ref <15)
TROPONIN I: 78 ng/L — ABNORMAL HIGH (ref <15)
TROPONIN I: 86 ng/L — ABNORMAL HIGH (ref <15)

## 2021-10-24 LAB — PT/INR
INR: 1.01 (ref 0.88–1.10)
PROTHROMBIN TIME: 11.7 seconds (ref 9.8–12.7)

## 2021-10-24 LAB — LIPASE: LIPASE: 641 U/L — ABNORMAL HIGH (ref 73–393)

## 2021-10-24 MED ORDER — ASPIRIN 81 MG CHEWABLE TABLET
CHEWABLE_TABLET | ORAL | Status: AC
Start: 2021-10-24 — End: 2021-10-24
  Filled 2021-10-24: qty 4

## 2021-10-24 MED ORDER — ASPIRIN 81 MG CHEWABLE TABLET
324.0000 mg | CHEWABLE_TABLET | ORAL | Status: AC
Start: 2021-10-24 — End: 2021-10-24
  Administered 2021-10-24: 324 mg via ORAL

## 2021-10-24 MED ORDER — SODIUM PHOSPHATES 19 GRAM-7 GRAM/118 ML ENEMA
133.0000 mL | ENEMA | RECTAL | Status: AC
Start: 2021-10-24 — End: 2021-10-24
  Administered 2021-10-24: 133 mL via RECTAL

## 2021-10-24 NOTE — Discharge Instructions (Signed)
With a slow transit constipation the enema work the other day, did not work so well today.  You are currently prescribed lactulose, increase that to twice daily and you can even increase the volume if needed until your bowel start to move.  Continue other home medications call follow-up with your primary care provider and nephrologist as scheduled.  Your troponin is still up some, likely secondary to the stent week and a half ago, and other issues with your kidney failure and difficulty in processing and excreting the troponin.

## 2021-10-24 NOTE — ED Attending Note (Addendum)
[facility] emergency department         HISTORY OF PRESENT ILLNESS     Date:  10/24/2021  Patient's Name:  [name]  Date of Birth:  1976/01/15    Patient is a 46 year old presenting with abdominal pain nausea and chest pain in the last 24 hours.  Patient is on hemodialysis patient was dialyzed yesterday did not complete a full 4 hours of treatment secondary to chest pain.  Patient also was seen 2 days ago for abdominal pain constipation.  She did receive a fleets and lactulose was a prescribed on discharge.  Tonight patient said the chest tightness was worse and since she was discharged from the emergency room she is not had a bowel movement.  She admits to cramping abdominal pain.  Patient was treated with fentanyl by EMS prior to coming to the ER.           Review of Systems     Review of Systems   Constitutional: Negative.    Eyes: Negative.    Respiratory:  Positive for shortness of breath.    Cardiovascular:  Positive for chest pain.   Gastrointestinal:  Positive for abdominal pain and constipation.   All other systems reviewed and are negative.    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    Tricuspid valve regurgitation     Uncontrolled hypertension        Past Surgical History:  Past Surgical History:   Procedure Laterality Date    Coronary artery angioplasty      Esophagogastroduodenoscopy      Hx back surgery      Hx cholecystectomy      Hx foot surgery Right     Hx hysterectomy      Hx tonsillectomy          Social History:  Social History     Tobacco Use    Smoking status: Every Day     Packs/day: 1.00     Years: 10.00     Pack years: 10.00     Types: Cigarettes    Smokeless tobacco: Former    Tobacco comments:     "Haven't had a cigerette in 2 weeks."   Vaping Use    Vaping Use: Never used   Substance Use Topics    Alcohol use: Not Currently    Drug use: Yes     Types: Marijuana     Social History     Substance and Sexual Activity   Drug Use Yes    Types: Marijuana       Family History:  Family History   Problem Relation Age of Onset    Diabetes type II Mother     Coronary Artery Disease Mother     Breast Cancer Mother     Hypertension (High  Blood Pressure) Father        Medication History:  Current Outpatient Medications   Medication Sig    acetaminophen (TYLENOL) 325 mg Oral Tablet Take 2 Tablets (650 mg total) by mouth Every 4 hours as needed    amLODIPine (NORVASC) 10 mg Oral Tablet Take 1 Tablet (10 mg total) by mouth Once a day Take one tablet every day    apixaban (ELIQUIS) 2.5 mg Oral Tablet Take 1 Tablet (2.5 mg total) by mouth Twice daily for 30 days    budesonide-formoteroL (SYMBICORT) 160-4.5 mcg/actuation Inhalation oral inhaler Take 2 Puffs by inhalation Twice daily    calcitrioL (ROCALTROL) 0.5 mcg Oral Capsule Take 2 Capsules (1 mcg total) by mouth Every Monday, Wednesday and Friday    calcium acetate,phosphat bind, (PHOSLO) 667 mg Oral Capsule Take 2 Capsules (1,334 mg total) by mouth Three times daily with meals for 30 days    cinacalcet (SENSIPAR) 30 mg Oral Tablet Take 1 Tablet (30 mg total) by mouth Every evening    cloNIDine (CATAPRES-TTS) 0.3 mg/24 hr Transdermal Patch Weekly Place 1 Patch (0.3 mg total) on the skin Every 7 days    cloNIDine HCL (CATAPRES) 0.2 mg Oral Tablet Take 1 Tablet (0.2 mg total) by mouth Twice daily Take one tablet twice a day    clopidogreL (PLAVIX) 75 mg Oral Tablet Take 1 Tablet (75 mg total) by mouth Once a day    cyanocobalamin (VITAMIN B 12) 1,000 mcg Oral  Tablet Take 1 Tablet (1,000 mcg total) by mouth Once a day with lunch (Patient not taking: Reported on 10/15/2021)    cyclobenzaprine (FLEXERIL) 5 mg Oral Tablet Take 1 Tablet (5 mg total) by mouth Twice per day as needed for Muscle spasms    diphenhydrAMINE (BENADRYL) 50 mg Oral Capsule Take 1 Capsule (50 mg total) by mouth Every 6 hours as needed for Itching (Patient taking differently: Take 25 mg by mouth Every 6 hours as needed for Itching)    doxazosin (CARDURA) 4 mg Oral Tablet Take 1 Tablet (4 mg total) by mouth Every evening    doxycycline 100 mg Oral Tablet Take 1 Tablet (100 mg total) by mouth Twice daily for 5 days    epoetin alfa-epbx (RETACRIT) 10,000 unit/mL Injection Solution Infuse 1 mL (10,000 Units total) into a venous catheter Give in Dialysis for 30 days    famotidine (PEPCID) 40 mg Oral Tablet Take 1 Tablet (40 mg total) by mouth Once a day Take one tablet once a day    furosemide (LASIX) 80 mg Oral Tablet Take 2 Tablets (160 mg total) by mouth Once a day Take after dialysis on dialysis days; otherwise take in morning daily.    hydrALAZINE (APRESOLINE) 100 mg Oral Tablet Take 1 Tablet (100 mg total) by mouth Every 8 hours for 30 days    ipratropium-albuterol 0.5 mg-3 mg(2.5 mg base)/3 mL Solution for Nebulization Take 3 mL by nebulization Four times a day as needed    isosorbide mononitrate (IMDUR) 120 mg Oral Tablet Sustained Release 24 hr Take 1 Tablet (120 mg total) by mouth Every morning for 30 days    labetaloL (NORMODYNE) 300 mg Oral Tablet Take 1 Tablet (300 mg total) by mouth Twice daily    lactulose (ENULOSE) 10 gram/15 mL Oral Solution Take 30 mL by mouth Once a day for 3 days    losartan (COZAAR) 100 mg Oral Tablet Take 1 Tablet (100 mg total) by mouth Every night Take one tablet everyday at bedtime  Methylprednisolone (MEDROL DOSEPACK) 4 mg Oral Tablets, Dose Pack Take as instructed.    montelukast (SINGULAIR) 10 mg Oral Tablet Take 1 Tablet (10 mg total) by mouth Every evening     oxyCODONE-acetaminophen (PERCOCET) 5-325 mg Oral Tablet Take 1 Tablet by mouth Every 6 hours as needed    pantoprazole (PROTONIX) 40 mg Oral Tablet, Delayed Release (E.C.) Take 1 Tablet (40 mg total) by mouth Once a day    rosuvastatin (CRESTOR) 40 mg Oral Tablet Take 1 Tablet (40 mg total) by mouth Every evening Take one tablet every day    spironolactone (ALDACTONE) 25 mg Oral Tablet Take 2 Tablets (50 mg total) by mouth Twice daily Patient takes 2 tablets of 25 mg twice a day       Allergies:  Allergies   Allergen Reactions    Lisinopril Swelling     Tongue and throat swelling    Cardene [Nicardipine]  Other Adverse Reaction (Add comment)     Chest pain     Oxycodone Itching       Physical Exam     Vitals:    BP (!) 155/90   Pulse 71   Temp 36.5 C (97.7 F)   Resp 16   Ht 1.702 m (5\' 7" )   Wt 83.9 kg (185 lb)   SpO2 100%   BMI 28.98 kg/m           Physical Exam  Vitals and nursing note reviewed.   Constitutional:       General: She is not in acute distress.     Appearance: She is well-developed.   HENT:      Head: Normocephalic and atraumatic.      Right Ear: Tympanic membrane normal.      Left Ear: Tympanic membrane normal.      Mouth/Throat:      Mouth: Mucous membranes are dry.   Eyes:      Conjunctiva/sclera: Conjunctivae normal.   Cardiovascular:      Rate and Rhythm: Normal rate and regular rhythm.      Pulses: Normal pulses.      Heart sounds: No murmur heard.  Pulmonary:      Effort: Pulmonary effort is normal. No respiratory distress.      Breath sounds: Normal breath sounds.   Abdominal:      General: Bowel sounds are normal.      Palpations: Abdomen is soft.      Tenderness: There is no abdominal tenderness.   Musculoskeletal:         General: No swelling. Normal range of motion.      Cervical back: Normal range of motion and neck supple.   Skin:     General: Skin is warm and dry.      Capillary Refill: Capillary refill takes less than 2 seconds.   Neurological:      Mental Status: She is  alert.   Psychiatric:         Mood and Affect: Mood normal.       Diagnostic Studies/Treatment     Medications:  Medications Administered in the ED   aspirin chewable tablet 324 mg (324 mg Oral Given 10/24/21 0602)       New Prescriptions    No medications on file       Labs:    Results for orders placed or performed during the hospital encounter of 10/24/21 (from the past 12 hour(s))   TROPONIN NOW   Result Value Ref Range  TROPONIN I 82 (H) <15 ng/L   PT/INR   Result Value Ref Range    PROTHROMBIN TIME 11.7 9.8 - 12.7 seconds    INR 1.01 0.88 - 1.10   LIPASE   Result Value Ref Range    LIPASE 641 (H) 73 - 393 U/L   COMPREHENSIVE METABOLIC PANEL, NON-FASTING   Result Value Ref Range    SODIUM 136 136 - 145 mmol/L    POTASSIUM 4.7 3.5 - 5.1 mmol/L    CHLORIDE 94 (L) 98 - 107 mmol/L    CO2 TOTAL 30 21 - 32 mmol/L    ANION GAP 12 4 - 13 mmol/L    BUN 69 (H) 7 - 18 mg/dL    CREATININE 8.42 (H) 0.55 - 1.02 mg/dL    BUN/CREA RATIO 8     ESTIMATED GFR 5 (L) >59 mL/min/1.18m^2    ALBUMIN 3.3 (L) 3.4 - 5.0 g/dL    CALCIUM 9.1 8.5 - 10.1 mg/dL    GLUCOSE 101 74 - 106 mg/dL    ALKALINE PHOSPHATASE 76 46 - 116 U/L    ALT (SGPT) 6 <=78 U/L    AST (SGOT) 17 15 - 37 U/L    BILIRUBIN TOTAL 0.5 0.2 - 1.0 mg/dL    PROTEIN TOTAL 7.3 6.4 - 8.2 g/dL    ALBUMIN/GLOBULIN RATIO 0.8 0.8 - 1.4    OSMOLALITY, CALCULATED 292 (H) 270 - 290 mOsm/kg    CALCIUM, CORRECTED 9.8 mg/dL    GLOBULIN 4.0         Radiology:  XR CHEST AP  XR KUB    XR CHEST AP   Final Result   QUESTIONABLE MILD VASCULAR CONGESTION WITH IMPROVED AERATION FROM THE PRIOR EXAM.         Radiologist location ID: [id number]         XR KUB   Final Result   CONSTIPATION.            Radiologist location ID: [id number]             ECG:   Normal sinus rhythm ventricular hypertrophy rate 71 beats per minute PR interval 206            Differential diagnosis  Chest pain, history of coronary disease, chronic renal failure on hemodialysis, abdominal pain,  constipation    Course/Disposition/Plan     Course:     ED Course as of 10/24/21 0705   Thu Oct 24, 2021   0704 Patient abdominal x-ray constipation patient will be treated with soapsuds enema.   0704 Patient with elevated troponin.  History of elevated troponins in the past will continue with serial troponins.     Chest pain will do serial troponins.  Patient was here within the last 2 days with constipation and abdominal pain did receive fleets enema and lactulose on discharge.  Disposition:    Data Unavailable    Condition at Disposition:     Stable  Follow up:   No follow-up provider specified.    Clinical Impression:     Clinical Impression   Chest pain, unspecified type (Primary)   Generalized abdominal pain   Constipation, unspecified constipation type         Winfred Burn, MD

## 2021-10-24 NOTE — ED Nurses Note (Signed)
Pt DC home ambulatory. Portable 02 in use. Verbal and written voices understanding.

## 2021-10-24 NOTE — ED Nurses Note (Signed)
Dr. Riki Sheer in DC instructions given. Pt voices understanding.

## 2021-10-24 NOTE — ED Nurses Note (Signed)
Nurse in . Pt awake alert . Sitting up on side of bed. Vas cath intact at right upper chest. Dressing intact. Pt informed Fleets enema ordered. Pt out of bed to [facility] . Fleets enema administered. Pt tolerated without difficulty.

## 2021-10-24 NOTE — ED Nurses Note (Signed)
Small formed brown stool noted. Pt back to bed without difficulty. States, "My stomach feels better now, can I have some breakfast? I just want to go home I'm tired. " Dr. Riki Sheer informed. Breakfast given. Pt taking without difficulty.

## 2021-10-24 NOTE — ED Attending Handoff Note (Signed)
MDM:  [name] presents with abdominal pain, and chest pain today.  She states the abdominal pain he is worse than her chest pain, and that her chest pain episodes of actually been less severe and less prolonged since she had the stents put in her heart about 3 weeks ago.  She has small bowel movement with her abdominal pain and continued constipation since the last visit, and she has lactulose at home but is only taking it once daily.  Exam has lungs are clear, heart regular rate and rhythm and abdomen is soft, mildly distended mild generalized tenderness without guarding, rebound or referred pain.  Labs CT were reviewed, lipase is 641, less than twice normal but elevated.  Go back to her renal diet, increase lactulose twice daily or even 3 times daily if that is what is needed to start and keep normal bowel movements.  She is call follow-up with Nephrology primary care provider, see discharge      ED Course as of 10/24/21 1115   Thu Oct 24, 2021   0736 LIPASE(!): 641      Discharged  Clinical Impression   Chest pain, unspecified type   Generalized abdominal pain (Primary)   Slow transit constipation     Medications Administered in the ED   aspirin chewable tablet 324 mg (324 mg Oral Given 10/24/21 0602)   sodium phosphates (FLEET) 133 mL enema (133 mL Rectal Given 10/24/21 1041)        Current Discharge Medication List        CONTINUE these medications - NO CHANGES were made during your visit.        Details   acetaminophen 325 mg Tablet  Commonly known as: TYLENOL   650 mg, Oral, EVERY 4 HOURS PRN  Refills: 0     amLODIPine 10 mg Tablet  Commonly known as: NORVASC   10 mg, Oral, DAILY, Take one tablet every day  Refills: 0     apixaban 2.5 mg Tablet  Commonly known as: ELIQUIS   2.5 mg, Oral, 2 TIMES DAILY  Qty: 60 Tablet  Refills: 0     budesonide-formoteroL 160-4.5 mcg/actuation oral inhaler  Commonly known as: SYMBICORT   2 Puffs, Inhalation, 2 TIMES DAILY  Refills: 0     calcitrioL 0.5 mcg Capsule  Commonly  known as: ROCALTROL   1 mcg, Oral, EVERY MO, WE AND FR  Refills: 0     calcium acetate(phosphat bind) 667 mg Capsule  Commonly known as: PHOSLO   1,334 mg, Oral, 3 TIMES DAILY WITH MEALS  Qty: 180 Capsule  Refills: 0     cinacalcet 30 mg Tablet  Commonly known as: SENSIPAR   30 mg, Oral, EVERY EVENING  Refills: 0     cloNIDine 0.3 mg/24 hr Patch Weekly  Commonly known as: CATAPRES-TTS   0.3 mg, Transdermal, EVERY 7 DAYS  Refills: 0     cloNIDine HCL 0.2 mg Tablet  Commonly known as: CATAPRES   0.2 mg, Oral, 2 TIMES DAILY, Take one tablet twice a day  Refills: 0     clopidogreL 75 mg Tablet  Commonly known as: PLAVIX   75 mg, Oral, DAILY  Refills: 0     cyanocobalamin 1,000 mcg Tablet  Commonly known as: VITAMIN B 12   1,000 mcg, Oral, DAILY WITH LUNCH  Refills: 0     cyclobenzaprine 5 mg Tablet  Commonly known as: FLEXERIL   5 mg, Oral, 2 TIMES DAILY PRN  Refills: 0  diphenhydrAMINE 50 mg Capsule  Commonly known as: BENADRYL   50 mg, Oral, EVERY 6 HOURS PRN  Refills: 0     doxazosin 4 mg Tablet  Commonly known as: CARDURA   4 mg, Oral, EVERY EVENING  Refills: 0     doxycycline 100 mg Tablet   100 mg, Oral, 2 TIMES DAILY  Qty: 10 Tablet  Refills: 0     epoetin alfa-epbx 10,000 unit/mL Solution  Commonly known as: RETACRIT   10,000 Units, Intravenous, GIVE IN DIALYSIS  Refills: 0     famotidine 40 mg Tablet  Commonly known as: PEPCID   40 mg, Oral, DAILY, Take one tablet once a day  Refills: 0     furosemide 80 mg Tablet  Commonly known as: LASIX   160 mg, Oral, DAILY, Take after dialysis on dialysis days; otherwise take in morning daily.   Refills: 0     hydrALAZINE 100 mg Tablet  Commonly known as: APRESOLINE   100 mg, Oral, EVERY 8 HOURS (SCHEDULED)  Qty: 90 Tablet  Refills: 0     ipratropium-albuteroL 0.5 mg-3 mg(2.5 mg base)/3 mL nebulizer solution  Commonly known as: DUONEB   3 mL, Nebulization, 4 TIMES DAILY PRN  Refills: 0     Isosorbide Mononitrate 120 mg Tablet Sustained Release 24 hr  Commonly known as:  IMDUR   120 mg, Oral, EVERY MORNING  Qty: 30 Tablet  Refills: 0     labetaloL 300 mg Tablet  Commonly known as: NORMODYNE   300 mg, Oral, 2 TIMES DAILY  Refills: 0     lactulose 10 gram/15 mL Solution  Commonly known as: ENULOSE   30 mL, Oral, DAILY  Qty: 90 mL  Refills: 0     losartan 100 mg Tablet  Commonly known as: COZAAR   100 mg, Oral, NIGHTLY, Take one tablet everyday at bedtime  Refills: 0     Methylprednisolone 4 mg Tablets, Dose Pack  Commonly known as: MEDROL DOSEPACK   Take as instructed.  Qty: 21 Tablet  Refills: 0     montelukast 10 mg Tablet  Commonly known as: SINGULAIR   10 mg, Oral, EVERY EVENING  Refills: 0     oxyCODONE-acetaminophen 5-325 mg Tablet  Commonly known as: PERCOCET   1 Tablet, Oral, EVERY 6 HOURS PRN  Refills: 0     pantoprazole 40 mg Tablet, Delayed Release (E.C.)  Commonly known as: PROTONIX   40 mg, Oral, DAILY  Refills: 0     rosuvastatin 40 mg Tablet  Commonly known as: CRESTOR   40 mg, Oral, EVERY EVENING, Take one tablet every day  Refills: 0     spironolactone 25 mg Tablet  Commonly known as: ALDACTONE   50 mg, Oral, 2 TIMES DAILY, Patient takes 2 tablets of 25 mg twice a day  Refills: 0

## 2021-10-24 NOTE — ED Nurses Note (Signed)
Pt presents with c/o of intermittent mid sternal chest tightening radiating to left upper arm since yesterday. Pt also reports abdominal pain and discomfort. No  BM since Monday.

## 2021-10-24 NOTE — ED Triage Notes (Addendum)
EMS reports CP/epigastric pain X 1 day . Pt reports unsure if it is CP or constipation. 30mcg fentanyl IM given en route.

## 2021-10-25 ENCOUNTER — Emergency Department
Admission: EM | Admit: 2021-10-25 | Discharge: 2021-10-26 | Disposition: A | Discharge status: Other Acute Inpatient Hospital | Payer: Medicare Other | Attending: FAMILY PRACTICE | Admitting: FAMILY PRACTICE

## 2021-10-25 ENCOUNTER — Emergency Department (HOSPITAL_BASED_OUTPATIENT_CLINIC_OR_DEPARTMENT_OTHER): Payer: Medicare Other

## 2021-10-25 ENCOUNTER — Emergency Department (HOSPITAL_BASED_OUTPATIENT_CLINIC_OR_DEPARTMENT_OTHER): Payer: Commercial Managed Care - PPO

## 2021-10-25 ENCOUNTER — Other Ambulatory Visit: Payer: Self-pay

## 2021-10-25 DIAGNOSIS — N186 End stage renal disease: Secondary | ICD-10-CM | POA: Insufficient documentation

## 2021-10-25 DIAGNOSIS — Z992 Dependence on renal dialysis: Secondary | ICD-10-CM | POA: Insufficient documentation

## 2021-10-25 DIAGNOSIS — J449 Chronic obstructive pulmonary disease, unspecified: Secondary | ICD-10-CM | POA: Insufficient documentation

## 2021-10-25 DIAGNOSIS — Z955 Presence of coronary angioplasty implant and graft: Secondary | ICD-10-CM | POA: Insufficient documentation

## 2021-10-25 DIAGNOSIS — I25118 Atherosclerotic heart disease of native coronary artery with other forms of angina pectoris: Secondary | ICD-10-CM

## 2021-10-25 DIAGNOSIS — R079 Chest pain, unspecified: Secondary | ICD-10-CM | POA: Insufficient documentation

## 2021-10-25 DIAGNOSIS — Z87891 Personal history of nicotine dependence: Secondary | ICD-10-CM | POA: Insufficient documentation

## 2021-10-25 DIAGNOSIS — I25119 Atherosclerotic heart disease of native coronary artery with unspecified angina pectoris: Secondary | ICD-10-CM | POA: Insufficient documentation

## 2021-10-25 DIAGNOSIS — R0789 Other chest pain: Secondary | ICD-10-CM

## 2021-10-25 LAB — MANUAL DIFFERENTIAL
LYMPHOCYTE %: 7 % — ABNORMAL LOW (ref 25–45)
LYMPHOCYTE ABSOLUTE: 1.14 10*3/uL (ref 1.10–5.00)
LYMPHOCYTES MANUAL: 7
MONOCYTE %: 4 % (ref 0–12)
MONOCYTE ABSOLUTE: 0.65 10*3/uL (ref 0.00–1.30)
MONOCYTES MANUAL: 4
NEUTROPHIL %: 89 % — ABNORMAL HIGH (ref 40–76)
NEUTROPHIL ABSOLUTE: 14.51 10*3/uL — ABNORMAL HIGH (ref 1.80–8.40)
NEUTROPHILS MANUAL: 89
PLATELET MORPHOLOGY COMMENT: ADEQUATE
RBC MORPHOLOGY COMMENT: ABNORMAL
TOTAL CELLS COUNTED [#] IN BLOOD: 100
WBC: 16.3 10*3/uL

## 2021-10-25 LAB — PTT (PARTIAL THROMBOPLASTIN TIME): APTT: 48.6 seconds — ABNORMAL HIGH (ref 22.0–31.7)

## 2021-10-25 LAB — ARTERIAL BLOOD GAS/LACTATE
%FIO2 (ARTERIAL): 28 %
BASE EXCESS (ARTERIAL): 6.4 mmol/L — ABNORMAL HIGH (ref <=2.0)
BICARBONATE (ARTERIAL): 30.1 mmol/L — ABNORMAL HIGH (ref 20.0–26.0)
CARBOXYHEMOGLOBIN: 3.7 % — ABNORMAL HIGH (ref <=1.5)
LACTATE: 0.7 mmol/L (ref <=4.0)
MET-HEMOGLOBIN: 0.2 % (ref <=2.0)
O2CT: 11.7 %
OXYHEMOGLOBIN: 94.8 % (ref 88.0–100.0)
PCO2 (ARTERIAL): 39 mm/Hg (ref 35–45)
PH (ARTERIAL): 7.5 — ABNORMAL HIGH (ref 7.35–7.45)
PO2 (ARTERIAL): 85 mm/Hg (ref 80–100)

## 2021-10-25 LAB — TROPONIN-I
TROPONIN I: 91 ng/L — ABNORMAL HIGH (ref <15)
TROPONIN I: 80 ng/L — ABNORMAL HIGH (ref <15)

## 2021-10-25 LAB — COMPREHENSIVE METABOLIC PANEL, NON-FASTING
ALBUMIN/GLOBULIN RATIO: 0.8 (ref 0.8–1.4)
ALBUMIN: 3.6 g/dL (ref 3.4–5.0)
ALKALINE PHOSPHATASE: 78 U/L (ref 46–116)
ALT (SGPT): 6 U/L (ref <=78)
ANION GAP: 11 mmol/L (ref 4–13)
AST (SGOT): 22 U/L (ref 15–37)
BILIRUBIN TOTAL: 0.6 mg/dL (ref 0.2–1.0)
BUN/CREA RATIO: 7
BUN: 46 mg/dL — ABNORMAL HIGH (ref 7–18)
CALCIUM, CORRECTED: 10.7 mg/dL
CALCIUM: 10.3 mg/dL — ABNORMAL HIGH (ref 8.5–10.1)
CHLORIDE: 94 mmol/L — ABNORMAL LOW (ref 98–107)
CO2 TOTAL: 31 mmol/L (ref 21–32)
CREATININE: 7.04 mg/dL — ABNORMAL HIGH (ref 0.55–1.02)
ESTIMATED GFR: 7 mL/min/{1.73_m2} — ABNORMAL LOW (ref >59)
GLOBULIN: 4.4
GLUCOSE: 97 mg/dL (ref 74–106)
OSMOLALITY, CALCULATED: 284 mOsm/kg (ref 270–290)
POTASSIUM: 4.6 mmol/L (ref 3.5–5.1)
PROTEIN TOTAL: 8 g/dL (ref 6.4–8.2)
SODIUM: 136 mmol/L (ref 136–145)

## 2021-10-25 LAB — CBC WITH DIFF
HCT: 29 % — ABNORMAL LOW (ref 37.0–47.0)
HGB: 9.1 g/dL — ABNORMAL LOW (ref 12.5–16.0)
MCH: 28.8 pg (ref 27.0–32.0)
MCHC: 31.4 g/dL — ABNORMAL LOW (ref 32.0–36.0)
MCV: 91.7 fL (ref 78.0–99.0)
MPV: 7.8 fL (ref 7.4–10.4)
PLATELETS: 452 10*3/uL — ABNORMAL HIGH (ref 140–440)
RBC: 3.16 10*6/uL — ABNORMAL LOW (ref 4.20–5.40)
RDW: 24.3 % — ABNORMAL HIGH (ref 11.6–14.8)
WBC: 16.3 10*3/uL — ABNORMAL HIGH (ref 4.0–10.5)

## 2021-10-25 LAB — PT/INR
INR: 0.98 (ref 0.88–1.10)
PROTHROMBIN TIME: 11.4 seconds (ref 9.8–12.7)

## 2021-10-25 LAB — MAGNESIUM: MAGNESIUM: 2.1 mg/dL (ref 1.8–2.4)

## 2021-10-25 LAB — LACTIC ACID LEVEL W/ REFLEX FOR LEVEL >2.0: LACTIC ACID: 0.9 mmol/L (ref 0.4–2.0)

## 2021-10-25 MED ORDER — NITROGLYCERIN 2 % TRANSDERMAL OINTMENT - PACKET
TOPICAL_OINTMENT | TRANSDERMAL | Status: AC
Start: 2021-10-25 — End: 2021-10-25
  Filled 2021-10-25: qty 1

## 2021-10-25 MED ORDER — ONDANSETRON HCL (PF) 4 MG/2 ML INJECTION SOLUTION
INTRAMUSCULAR | Status: AC
Start: 2021-10-25 — End: 2021-10-25
  Filled 2021-10-25: qty 2

## 2021-10-25 MED ORDER — NITROGLYCERIN 0.4 MG SUBLINGUAL TABLET
0.4000 mg | SUBLINGUAL_TABLET | SUBLINGUAL | 0 refills | Status: AC | PRN
Start: 2021-10-25 — End: ?

## 2021-10-25 MED ORDER — ASPIRIN 81 MG CHEWABLE TABLET
CHEWABLE_TABLET | ORAL | Status: AC
Start: 2021-10-25 — End: 2021-10-25
  Filled 2021-10-25: qty 4

## 2021-10-25 MED ORDER — PROCHLORPERAZINE EDISYLATE 10 MG/2 ML (5 MG/ML) INJECTION SOLUTION
10.0000 mg | INTRAMUSCULAR | Status: AC
Start: 2021-10-25 — End: 2021-10-25
  Administered 2021-10-25: 10 mg via INTRAVENOUS

## 2021-10-25 MED ORDER — VANCOMYCIN 1,000 MG INTRAVENOUS INJECTION
INTRAVENOUS | Status: AC
Start: 2021-10-25 — End: 2021-10-25
  Filled 2021-10-25: qty 10

## 2021-10-25 MED ORDER — SODIUM CHLORIDE 0.9 % INTRAVENOUS PIGGYBACK
1.0000 g | INTRAVENOUS | Status: DC
Start: 2021-10-25 — End: 2021-10-26
  Administered 2021-10-25: 1 g via INTRAVENOUS
  Administered 2021-10-25 (×2): 0 g via INTRAVENOUS

## 2021-10-25 MED ORDER — LABETALOL 5 MG/ML INTRAVENOUS SOLUTION
INTRAVENOUS | Status: AC
Start: 2021-10-25 — End: 2021-10-25
  Filled 2021-10-25: qty 20

## 2021-10-25 MED ORDER — FENTANYL (PF) 50 MCG/ML INJECTION SOLUTION
50.0000 ug | INTRAMUSCULAR | Status: AC
Start: 2021-10-25 — End: 2021-10-25
  Administered 2021-10-25: 50 ug via INTRAVENOUS

## 2021-10-25 MED ORDER — HYDRALAZINE 20 MG/ML INJECTION SOLUTION
INTRAMUSCULAR | Status: AC
Start: 2021-10-25 — End: 2021-10-25
  Filled 2021-10-25: qty 1

## 2021-10-25 MED ORDER — PROCHLORPERAZINE EDISYLATE 10 MG/2 ML (5 MG/ML) INJECTION SOLUTION
INTRAMUSCULAR | Status: AC
Start: 2021-10-25 — End: 2021-10-25
  Filled 2021-10-25: qty 2

## 2021-10-25 MED ORDER — ASPIRIN 81 MG CHEWABLE TABLET
324.0000 mg | CHEWABLE_TABLET | ORAL | Status: AC
Start: 2021-10-25 — End: 2021-10-25
  Administered 2021-10-25: 324 mg via ORAL

## 2021-10-25 MED ORDER — NITROGLYCERIN 0.4 MG SUBLINGUAL TABLET
0.4000 mg | SUBLINGUAL_TABLET | SUBLINGUAL | Status: AC
Start: 2021-10-25 — End: 2021-10-25
  Administered 2021-10-25: 0.4 mg via SUBLINGUAL

## 2021-10-25 MED ORDER — HYDRALAZINE 20 MG/ML INJECTION SOLUTION
20.0000 mg | INTRAMUSCULAR | Status: AC
Start: 2021-10-25 — End: 2021-10-25
  Administered 2021-10-25: 20 mg via INTRAVENOUS

## 2021-10-25 MED ORDER — FENTANYL (PF) 50 MCG/ML INJECTION SOLUTION
INTRAMUSCULAR | Status: AC
Start: 2021-10-25 — End: 2021-10-25
  Filled 2021-10-25: qty 2

## 2021-10-25 MED ORDER — NITROGLYCERIN 0.4 MG SUBLINGUAL TABLET
SUBLINGUAL_TABLET | SUBLINGUAL | Status: AC
Start: 2021-10-25 — End: 2021-10-25
  Filled 2021-10-25: qty 1

## 2021-10-25 MED ORDER — LABETALOL 20 MG/4 ML (5 MG/ML) INTRAVENOUS SYRINGE
10.0000 mg | INJECTION | INTRAVENOUS | Status: AC
Start: 2021-10-25 — End: 2021-10-25
  Administered 2021-10-25: 10 mg via INTRAVENOUS
  Filled 2021-10-25: qty 4

## 2021-10-25 MED ORDER — VANCOMYCIN 1,000 MG INTRAVENOUS INJECTION
20.0000 mg/kg | INTRAVENOUS | Status: AC
Start: 2021-10-25 — End: 2021-10-25
  Administered 2021-10-25: 0 mg via INTRAVENOUS
  Administered 2021-10-25: 1500 mg via INTRAVENOUS
  Filled 2021-10-25: qty 20

## 2021-10-25 MED ORDER — CEFEPIME 1 GRAM SOLUTION FOR INJECTION
INTRAMUSCULAR | Status: AC
Start: 2021-10-25 — End: 2021-10-25
  Filled 2021-10-25: qty 10

## 2021-10-25 NOTE — ED Triage Notes (Signed)
Chest pain started this morning and patient has been vomiting all day.

## 2021-10-25 NOTE — ED Provider Notes (Signed)
[facility], [address] [facility] Emergency Department  ED Primary Provider Note  History of Present Illness   Chief Complaint   Patient presents with    Chest Pain      [name] is a 46 y.o. female who had concerns including Chest Pain .  Arrival: The patient arrived by Car    This 46 year old female patient presents emergency department with chest pain heaviness tightness and shortness of breath onset earlier today, she went to dialysis today and completed her dialysis, going home hoping that her chest pain and dyspnea would improve, but it has not.  She did recently have left heart catheterization with stents placed in the anterior portion of the heart, RCA was not addressed at that point and she says the provider at [facility] feels that she may need CABG.  Her blood pressures been very poorly controlled, however she is been running in the 885-027 range systolic over the last couple weeks since the heart catheterization, but today she is greatly elevated at 2 20/133 again.  She also has associated nausea, vomiting, dyspnea and diaphoresis associated with the chest pain and she notes hand and leg swelling that she has not had recently as well.  She recently quit smoking denies alcohol illicit drug use or marijuana use.    She is COVID vaccinated with the [employer] vaccine, and [place] booster.    Review of Systems   Pertinent positive and negative ROS as per HPI.  Historical Data   History Reviewed This Encounter:  Patient's past medical, surgical, social history reviewed noted.    Physical Exam   ED Triage Vitals [10/25/21 1906]   BP (Non-Invasive) (!) 220/133   Heart Rate 95   Respiratory Rate 20   Temperature 37.3 C (99.1 F)   SpO2 98 %   Weight 83.9 kg (185 lb)   Height 1.702 m (5' 7" )     Physical Exam  General:  Patient appears acutely ill, diaphoretic sitting upright in the bed.    Ears: TMs clear throat for traction  Nasal:  Boggy mucosa, patent bilaterally   Oral:  Pink moist  oropharynx: Mild injection without PND, exudate, petechiae   Neck: Supple without JVD or accessory muscle use to breathe   Lungs:  Bilateral lower lobe crackles, clear symmetrical good aeration otherwise.    Heart: Regular rate rhythm S1-S2 without obvious gallop, left sternal border soft mid systolic murmur   Abdomen:  Soft normal bowel sounds, nontender.    Extremities:  Trace edema in the hands and feet, moving symmetrically   Neurological:  No gross motor sensory deficits.      Patient Data     Labs Ordered/Reviewed   COMPREHENSIVE METABOLIC PANEL, NON-FASTING - Abnormal; Notable for the following components:       Result Value    CHLORIDE 94 (*)     BUN 46 (*)     CREATININE 7.04 (*)     ESTIMATED GFR 7 (*)     CALCIUM 10.3 (*)     All other components within normal limits    Narrative:     Above range, check for dilution.  Estimated Glomerular Filtration Rate (eGFR) is calculated using the CKD-EPI (2021) equation, intended for patients 52 years of age and older. If gender is not documented or "unknown", there will be no eGFR calculation.   TROPONIN-I - Abnormal; Notable for the following components:    TROPONIN I 91 (*)     All other components within  normal limits    Narrative:     Values received on females ranging between 12-15 ng/L MUST include the next serial troponin to review changes in the delta differences as the reference range for the Access II chemistry analyzer is lower than the established reference range.     ARTERIAL BLOOD GAS/LACTATE - Abnormal; Notable for the following components:    PH (ARTERIAL) 7.50 (*)     BICARBONATE (ARTERIAL) 30.1 (*)     BASE EXCESS (ARTERIAL) 6.4 (*)     CARBOXYHEMOGLOBIN 3.7 (*)     All other components within normal limits    Narrative:     2 LPM NC   PTT (PARTIAL THROMBOPLASTIN TIME) - Abnormal; Notable for the following components:    APTT 48.6 (*)     All other components within normal limits   CBC WITH DIFF - Abnormal; Notable for the following components:     WBC 16.3 (*)     RBC 3.16 (*)     HGB 9.1 (*)     HCT 29.0 (*)     MCHC 31.4 (*)     RDW 24.3 (*)     PLATELETS 452 (*)     All other components within normal limits   MANUAL DIFFERENTIAL - Abnormal; Notable for the following components:    NEUTROPHIL % 89 (*)     LYMPHOCYTE % 7 (*)     NEUTROPHIL ABSOLUTE 14.51 (*)     All other components within normal limits   MAGNESIUM - Normal   PT/INR - Normal   LACTIC ACID LEVEL W/ REFLEX FOR LEVEL >2.0 - Normal   ADULT ROUTINE BLOOD CULTURE, SET OF 2 BOTTLES (BACTERIA AND YEAST)   ADULT ROUTINE BLOOD CULTURE, SET OF 2 BOTTLES (BACTERIA AND YEAST)   CBC/DIFF    Narrative:     The following orders were created for panel order CBC/DIFF.  Procedure                               Abnormality         Status                     ---------                               -----------         ------                     CBC WITH DGUY[[id number]]                Abnormal            Final result               MANUAL DIFFERENTIAL[[id number]]          Abnormal            Final result                 Please view results for these tests on the individual orders.   TROPONIN-I   TROPONIN-I     XR AP MOBILE CHEST   Final Result by Edi, Radresults In (09/01 2002)   POSSIBLE PULMONARY VASCULAR CONGESTION. NO OTHER ACUTE FINDINGS ARE SEEN.         Radiologist location ID: [id number]  Medical Decision Making        Medical Decision Making  High complexity due comorbidities presentation.  Differential includes MI, pneumonia, PE, pneumothorax, uncontrolled hypertension, angina    Amount and/or Complexity of Data Reviewed  Labs: ordered.  Radiology: ordered.  ECG/medicine tests: ordered.    Risk  OTC drugs.  Prescription drug management.  Parenteral controlled substances.      ED Course as of 10/25/21 2210   Fri Oct 25, 2021   2029 Patient transitioned over to Dr. Nelson Chimes, shift provider for review of remainder of labs, disposition.   2124 EKG at 7:10 p.m., read at 7:11 p.m.:  Sinus rhythm with  first-degree heart block, LVH at 95 normal intervals and axis otherwise.  No STEMI   2157 Patient's labs, presentation discussed and transition of care over to Dr. Nelson Chimes for final disposition         Medications Administered in the ED   vancomycin (VANCOCIN) 1,500 mg in NS 500 mL IVPB (1,500 mg Intravenous New Bag/New Syringe 10/25/21 2145)   cefepime (MAXIPIME) 1 g in NS 50 mL IVPB minibag (1 g Intravenous New Bag/New Syringe 10/25/21 2146)   aspirin chewable tablet 324 mg (324 mg Oral Given 10/25/21 1942)   hydrALAZINE (APRESOLINE) injection 20 mg (20 mg Intravenous Given 10/25/21 1942)   prochlorperazine (COMPAZINE) 5 mg/mL injection (10 mg Intravenous Given 10/25/21 2017)   fentaNYL (SUBLIMAZE) 50 mcg/mL injection (50 mcg Intravenous Given 10/25/21 2017)   labetalol (TRANDATE) 5 mg/mL injection (10 mg Intravenous Given 10/25/21 2045)   prochlorperazine (COMPAZINE) 5 mg/mL injection (10 mg Intravenous Given 10/25/21 2200)   fentaNYL (SUBLIMAZE) 50 mcg/mL injection (50 mcg Intravenous Given 10/25/21 2207)     Clinical Impression   Chest pressure (Primary)   Atherosclerosis of native coronary artery of native heart with other form of angina pectoris (CMS HCC)   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 Nelson Chimes, night shift provider for disposition.    CRITICAL CARE ATTESTATION: Total critical care time spent in direct care of this patient presenting with uncontrolled hypertension, chest pain with corner after a sclerosis and end-stage renal disease on dialysis who is at high risk of permanent disability and/or death based on history/exam/and complaint or signs/symptoms/history elicited during initial evaluation or during course of ED care, including the initial evaluation and stabilization, care delivered throughout ED stay, review of  data, re-examination, discussion with admitting and consulting services to arrange definitive care, discussion with patient and family members as appropriate regarding such care, documentation of such, and exclusive of any procedures performed, was 40 minutes.     Marland KitchenBretta Bang, DO

## 2021-10-25 NOTE — ED Nurses Note (Signed)
Transportation attempts made to    [place]

## 2021-10-25 NOTE — ED Nurses Note (Signed)
Patient accepted to [facility]  Dr. Babs Bertin to [place] room 615  Report number is [phone]

## 2021-10-25 NOTE — ED Nurses Note (Signed)
Admission attempts made to    [facility]

## 2021-10-26 LAB — TROPONIN-I: TROPONIN I: 97 ng/L — ABNORMAL HIGH (ref <15)

## 2021-10-26 MED ORDER — LOSARTAN 50 MG TABLET
100.0000 mg | ORAL_TABLET | Freq: Once | ORAL | Status: AC
Start: 2021-10-26 — End: 2021-10-26
  Administered 2021-10-26: 100 mg via ORAL

## 2021-10-26 MED ORDER — LOSARTAN 50 MG TABLET
ORAL_TABLET | ORAL | Status: AC
Start: 2021-10-26 — End: 2021-10-26
  Filled 2021-10-26: qty 2

## 2021-10-26 MED ORDER — HALOPERIDOL LACTATE 5 MG/ML INJECTION SOLUTION
INTRAMUSCULAR | Status: AC
Start: 2021-10-26 — End: 2021-10-26
  Filled 2021-10-26: qty 1

## 2021-10-26 MED ORDER — HALOPERIDOL LACTATE 5 MG/ML INJECTION SOLUTION
5.0000 mg | INTRAMUSCULAR | Status: AC
Start: 2021-10-26 — End: 2021-10-26
  Administered 2021-10-26: 5 mg via INTRAMUSCULAR

## 2021-10-26 MED ORDER — CLONIDINE HCL 0.1 MG TABLET
0.1000 mg | ORAL_TABLET | Freq: Once | ORAL | Status: AC
Start: 2021-10-26 — End: 2021-10-26
  Administered 2021-10-26: 0.1 mg via ORAL

## 2021-10-26 MED ORDER — CLONIDINE HCL 0.1 MG TABLET
ORAL_TABLET | ORAL | Status: AC
Start: 2021-10-26 — End: 2021-10-26
  Filled 2021-10-26: qty 1

## 2021-10-26 NOTE — ED Nurses Note (Signed)
Pt given nitro 0.4mg  sl for vhest pain.  Md aware

## 2021-10-26 NOTE — ED Attending Handoff Note (Signed)
MDM:    ED Course as of 10/26/21 0317   Fri Oct 25, 2021   2230 I did receive sign-out for this patient at approximately 10:15 p.m.Marland Kitchen  She confirms large parts of history.  She was catheterization and stented about a month ago and still had some degree of vessel disease remaining.  She states she is had chest pain all day and is currently have an episode which is about 7/10 left-sided.  She is not short of breath.  She states she has been compliant with Brilinta.  She received a full dose of dialysis today.  Patient remains full code.  Patient will be given nitro she just receive labetalol for blood pressure control.   2247 I did speak with [facility] who declined admission as patient is too complex for [place].  We reviewed [facility] cardiac catheterization report and recent cardiac history and she was transferred to [facility] secondary to complexity.  Chronic states that it is unlikely we will be able to provide her a significant intervention which she may need   2330 Case discussed with [facility] cardiology Bradly Chris who kindly accepted for transfer.          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 (CMS [facility])   Chronic obstructive pulmonary disease, unspecified COPD type (CMS HCC)   History of tobacco abuse     Medications Administered in the ED   cefepime (MAXIPIME) 1 g in NS 50 mL IVPB minibag (0 g Intravenous Stopped 10/25/21 2226)   aspirin chewable tablet 324 mg (324 mg Oral Given 10/25/21 1942)   hydrALAZINE (APRESOLINE) injection 20 mg (20 mg Intravenous Given 10/25/21 1942)   prochlorperazine (COMPAZINE) 5 mg/mL injection (10 mg Intravenous Given 10/25/21 2017)   fentaNYL (SUBLIMAZE) 50 mcg/mL injection (50 mcg Intravenous Given 10/25/21 2017)   labetalol (TRANDATE) 5 mg/mL injection (10 mg  Intravenous Given 10/25/21 2045)   vancomycin (VANCOCIN) 1,500 mg in NS 500 mL IVPB (0 mg Intravenous Stopped 10/25/21 2345)   prochlorperazine (COMPAZINE) 5 mg/mL injection (10 mg Intravenous Given 10/25/21 2200)   fentaNYL (SUBLIMAZE) 50 mcg/mL injection (50 mcg Intravenous Given 10/25/21 2207)   nitroGLYCERIN (NITROSTAT) sublingual tablet (0.4 mg Sublingual Given 10/25/21 2257)   losartan (COZAAR) tablet (has no administration in time range)   cloNIDine (CATAPRES) tablet (has no administration in time range)        Current Discharge Medication List        START taking these medications.        Details   nitroGLYCERIN 0.4 mg Tablet, Sublingual  Commonly known as: NITROSTAT   0.4 mg, Sublingual, EVERY 5 MIN PRN, for 3 doses over 15 minutes  Qty: 3 Tablet  Refills: 0            CONTINUE these medications - NO CHANGES were made during your visit.        Details   acetaminophen 325 mg Tablet  Commonly known as: TYLENOL   650 mg, Oral, EVERY 4 HOURS PRN  Refills: 0     amLODIPine 10 mg Tablet  Commonly known as: NORVASC   10 mg, Oral, DAILY, Take one tablet every day  Refills: 0     apixaban 2.5 mg Tablet  Commonly known as: ELIQUIS   2.5 mg, Oral, 2  TIMES DAILY  Qty: 60 Tablet  Refills: 0     budesonide-formoteroL 160-4.5 mcg/actuation oral inhaler  Commonly known as: SYMBICORT   2 Puffs, Inhalation, 2 TIMES DAILY  Refills: 0     calcitrioL 0.5 mcg Capsule  Commonly known as: ROCALTROL   1 mcg, Oral, EVERY MO, WE AND FR  Refills: 0     calcium acetate(phosphat bind) 667 mg Capsule  Commonly known as: PHOSLO   1,334 mg, Oral, 3 TIMES DAILY WITH MEALS  Qty: 180 Capsule  Refills: 0     cinacalcet 30 mg Tablet  Commonly known as: SENSIPAR   30 mg, Oral, EVERY EVENING  Refills: 0     cloNIDine 0.3 mg/24 hr Patch Weekly  Commonly known as: CATAPRES-TTS   0.3 mg, Transdermal, EVERY 7 DAYS  Refills: 0     cloNIDine HCL 0.2 mg Tablet  Commonly known as: CATAPRES   0.2 mg, Oral, 2 TIMES DAILY, Take one tablet twice a day  Refills: 0      clopidogreL 75 mg Tablet  Commonly known as: PLAVIX   75 mg, Oral, DAILY  Refills: 0     cyanocobalamin 1,000 mcg Tablet  Commonly known as: VITAMIN B 12   1,000 mcg, Oral, DAILY WITH LUNCH  Refills: 0     cyclobenzaprine 5 mg Tablet  Commonly known as: FLEXERIL   5 mg, Oral, 2 TIMES DAILY PRN  Refills: 0     diphenhydrAMINE 50 mg Capsule  Commonly known as: BENADRYL   50 mg, Oral, EVERY 6 HOURS PRN  Refills: 0     doxazosin 4 mg Tablet  Commonly known as: CARDURA   4 mg, Oral, EVERY EVENING  Refills: 0     doxycycline 100 mg Tablet   100 mg, Oral, 2 TIMES DAILY  Qty: 10 Tablet  Refills: 0     epoetin alfa-epbx 10,000 unit/mL Solution  Commonly known as: RETACRIT   10,000 Units, Intravenous, GIVE IN DIALYSIS  Refills: 0     famotidine 40 mg Tablet  Commonly known as: PEPCID   40 mg, Oral, DAILY, Take one tablet once a day  Refills: 0     furosemide 80 mg Tablet  Commonly known as: LASIX   160 mg, Oral, DAILY, Take after dialysis on dialysis days; otherwise take in morning daily.   Refills: 0     hydrALAZINE 100 mg Tablet  Commonly known as: APRESOLINE   100 mg, Oral, EVERY 8 HOURS (SCHEDULED)  Qty: 90 Tablet  Refills: 0     ipratropium-albuteroL 0.5 mg-3 mg(2.5 mg base)/3 mL nebulizer solution  Commonly known as: DUONEB   3 mL, Nebulization, 4 TIMES DAILY PRN  Refills: 0     Isosorbide Mononitrate 120 mg Tablet Sustained Release 24 hr  Commonly known as: IMDUR   120 mg, Oral, EVERY MORNING  Qty: 30 Tablet  Refills: 0     labetaloL 300 mg Tablet  Commonly known as: NORMODYNE   300 mg, Oral, 2 TIMES DAILY  Refills: 0     losartan 100 mg Tablet  Commonly known as: COZAAR   100 mg, Oral, NIGHTLY, Take one tablet everyday at bedtime  Refills: 0     Methylprednisolone 4 mg Tablets, Dose Pack  Commonly known as: MEDROL DOSEPACK   Take as instructed.  Qty: 21 Tablet  Refills: 0     montelukast 10 mg Tablet  Commonly known as: SINGULAIR   10 mg, Oral, EVERY EVENING  Refills: 0  oxyCODONE-acetaminophen 5-325 mg  Tablet  Commonly known as: PERCOCET   1 Tablet, Oral, EVERY 6 HOURS PRN  Refills: 0     pantoprazole 40 mg Tablet, Delayed Release (E.C.)  Commonly known as: PROTONIX   40 mg, Oral, DAILY  Refills: 0     rosuvastatin 40 mg Tablet  Commonly known as: CRESTOR   40 mg, Oral, EVERY EVENING, Take one tablet every day  Refills: 0     spironolactone 25 mg Tablet  Commonly known as: ALDACTONE   50 mg, Oral, 2 TIMES DAILY, Patient takes 2 tablets of 25 mg twice a day  Refills: 0            ASK your doctor about these medications.        Details   lactulose 10 gram/15 mL Solution  Commonly known as: ENULOSE  Ask about: Should I take this medication?   30 mL, Oral, DAILY  Qty: 90 mL  Refills: 0

## 2021-10-26 NOTE — ED Nurses Note (Signed)
Report called to [facility] RN Maudie Mercury.  [facility] here to transport patient.  Pt remians hypertensive, all other vital signs stable.  Patient denies pain or discomfort at this time. IV intact.  Left ED with [facility].

## 2021-10-28 DIAGNOSIS — R079 Chest pain, unspecified: Secondary | ICD-10-CM

## 2021-10-28 LAB — ECG 12 LEAD
Atrial Rate: 95 {beats}/min
Calculated P Axis: 71 degrees
Calculated R Axis: 17 degrees
Calculated T Axis: 85 degrees
PR Interval: 208 ms
QRS Duration: 106 ms
QT Interval: 376 ms
QTC Calculation: 472 ms
Ventricular rate: 95 {beats}/min

## 2021-10-30 LAB — ADULT ROUTINE BLOOD CULTURE, SET OF 2 BOTTLES (BACTERIA AND YEAST)
BLOOD CULTURE, ROUTINE: NO GROWTH
BLOOD CULTURE, ROUTINE: NO GROWTH

## 2021-11-01 LAB — ECG 12 LEAD
Atrial Rate: 71 {beats}/min
Calculated P Axis: 39 degrees
Calculated R Axis: 22 degrees
Calculated T Axis: 72 degrees
PR Interval: 206 ms
QRS Duration: 108 ms
QT Interval: 428 ms
QTC Calculation: 465 ms
Ventricular rate: 71 {beats}/min

## 2021-11-09 ENCOUNTER — Emergency Department (HOSPITAL_BASED_OUTPATIENT_CLINIC_OR_DEPARTMENT_OTHER): Payer: Medicare Other

## 2021-11-09 ENCOUNTER — Other Ambulatory Visit: Payer: Self-pay

## 2021-11-09 ENCOUNTER — Emergency Department
Admission: EM | Admit: 2021-11-09 | Discharge: 2021-11-09 | Disposition: A | Discharge status: Home / Self Care | Payer: Medicare Other | Attending: Emergency Medicine | Admitting: Emergency Medicine

## 2021-11-09 DIAGNOSIS — S0003XA Contusion of scalp, initial encounter: Secondary | ICD-10-CM | POA: Insufficient documentation

## 2021-11-09 DIAGNOSIS — Z7902 Long term (current) use of antithrombotics/antiplatelets: Secondary | ICD-10-CM | POA: Insufficient documentation

## 2021-11-09 DIAGNOSIS — S40012A Contusion of left shoulder, initial encounter: Secondary | ICD-10-CM | POA: Insufficient documentation

## 2021-11-09 DIAGNOSIS — S0990XA Unspecified injury of head, initial encounter: Secondary | ICD-10-CM

## 2021-11-09 DIAGNOSIS — W050XXA Fall from non-moving wheelchair, initial encounter: Secondary | ICD-10-CM | POA: Insufficient documentation

## 2021-11-09 DIAGNOSIS — Z7901 Long term (current) use of anticoagulants: Secondary | ICD-10-CM

## 2021-11-09 DIAGNOSIS — Z20822 Contact with and (suspected) exposure to covid-19: Secondary | ICD-10-CM | POA: Insufficient documentation

## 2021-11-09 DIAGNOSIS — Z992 Dependence on renal dialysis: Secondary | ICD-10-CM | POA: Insufficient documentation

## 2021-11-09 DIAGNOSIS — W19XXXA Unspecified fall, initial encounter: Secondary | ICD-10-CM

## 2021-11-09 DIAGNOSIS — S161XXA Strain of muscle, fascia and tendon at neck level, initial encounter: Secondary | ICD-10-CM | POA: Insufficient documentation

## 2021-11-09 LAB — ECG 12 LEAD
Atrial Rate: 71 {beats}/min
Calculated P Axis: 69 degrees
Calculated R Axis: -36 degrees
Calculated T Axis: 90 degrees
PR Interval: 222 ms
QRS Duration: 108 ms
QT Interval: 452 ms
QTC Calculation: 491 ms
Ventricular rate: 71 {beats}/min

## 2021-11-09 LAB — COVID-19, FLU A/B, RSV RAPID BY PCR
INFLUENZA VIRUS TYPE A: NOT DETECTED
INFLUENZA VIRUS TYPE B: NOT DETECTED
RESPIRATORY SYNCTIAL VIRUS (RSV): NOT DETECTED
SARS-CoV-2: NOT DETECTED

## 2021-11-09 LAB — COMPREHENSIVE METABOLIC PANEL, NON-FASTING
ALBUMIN/GLOBULIN RATIO: 0.8 (ref 0.8–1.4)
ALBUMIN: 3.1 g/dL — ABNORMAL LOW (ref 3.4–5.0)
ALKALINE PHOSPHATASE: 74 U/L (ref 46–116)
ALT (SGPT): 9 U/L (ref <=78)
ANION GAP: 10 mmol/L (ref 4–13)
AST (SGOT): 18 U/L (ref 15–37)
BILIRUBIN TOTAL: 0.6 mg/dL (ref 0.2–1.0)
BUN/CREA RATIO: 4
BUN: 30 mg/dL — ABNORMAL HIGH (ref 7–18)
CALCIUM, CORRECTED: 9.8 mg/dL
CALCIUM: 8.9 mg/dL (ref 8.5–10.1)
CHLORIDE: 95 mmol/L — ABNORMAL LOW (ref 98–107)
CO2 TOTAL: 30 mmol/L (ref 21–32)
CREATININE: 7.54 mg/dL — ABNORMAL HIGH (ref 0.55–1.02)
ESTIMATED GFR: 6 mL/min/{1.73_m2} — ABNORMAL LOW (ref >59)
GLOBULIN: 4.1
GLUCOSE: 102 mg/dL (ref 74–106)
OSMOLALITY, CALCULATED: 277 mOsm/kg (ref 270–290)
POTASSIUM: 4.1 mmol/L (ref 3.5–5.1)
PROTEIN TOTAL: 7.2 g/dL (ref 6.4–8.2)
SODIUM: 135 mmol/L — ABNORMAL LOW (ref 136–145)

## 2021-11-09 MED ORDER — TRAMADOL 50 MG TABLET
50.0000 mg | ORAL_TABLET | ORAL | Status: DC
Start: 2021-11-09 — End: 2021-11-09
  Administered 2021-11-09: 0 mg via ORAL

## 2021-11-09 MED ORDER — TRAMADOL 50 MG TABLET
1.0000 | ORAL_TABLET | Freq: Four times a day (QID) | ORAL | 0 refills | Status: DC | PRN
Start: 2021-11-09 — End: 2022-01-30

## 2021-11-09 NOTE — ED Provider Notes (Signed)
[facility], [facility] Emergency Department  ED Primary Provider Note  History of Present Illness   Chief Complaint   Patient presents with    Generalized Body Aches     [name] is a 46 y.o. female who had concerns including Generalized Body Aches.  Arrival: The patient arrived by Ambulance complaining hitting her head, neck pain, and left shoulder pain after her wheelchair tipped over on her yesterday landing on top of her.  Patient states she laid there for approximately 35 minutes before someone came to help get the wheelchair off of her.  She stated the wheelchair wheals were sliding on the ramp and then all of a sudden went over.  Patient denies any chest pain or shortness of breath.  No abdominal pain.  Patient states she is sore from her head to her left shoulder.  Patient is complaining of neck pain radiating into the left shoulder.  She denies any numbness or tingling in the extremity.  No one-sided weakness.  Patient presently takes Plavix as well as Eliquis.  She is complaining of a severe headache on the left side as well.  She denies any blurred or double vision.  Patient had dialysis yesterday and was on her way home when this occurred.    HPI  Review of Systems   Review of Systems   Constitutional:  Positive for activity change and appetite change. Negative for chills and fever.   HENT:  Negative for ear pain and sore throat.    Eyes:  Negative for pain and visual disturbance.   Respiratory:  Negative for cough and shortness of breath.    Cardiovascular:  Negative for chest pain and palpitations.   Gastrointestinal:  Negative for abdominal pain and vomiting.   Genitourinary:  Negative for dysuria and hematuria.   Musculoskeletal:  Positive for arthralgias, joint swelling, myalgias and neck pain. Negative for back pain.   Skin:  Negative for color change and rash.   Neurological:  Positive for headaches. Negative for seizures and syncope.   All other systems  reviewed and are negative.   Historical Data   History Reviewed This Encounter:     Physical Exam   ED Triage Vitals [11/09/21 0619]   BP (Non-Invasive) (!) 140/80   Heart Rate 77   Respiratory Rate 20   Temperature 36.7 C (98.1 F)   SpO2 98 %   Weight 83.9 kg (185 lb)   Height 1.702 m (5' 7" )     Physical Exam  Vitals and nursing note reviewed.   Constitutional:       General: She is not in acute distress.     Appearance: She is well-developed. She is obese.   HENT:      Head: Normocephalic.      Comments: Positive tenderness with minimal swelling on the left occiput area and left parietal area.     Right Ear: Ear canal and external ear normal.      Left Ear: Ear canal and external ear normal.      Nose: Nose normal.      Mouth/Throat:      Mouth: Mucous membranes are moist.   Eyes:      Extraocular Movements: Extraocular movements intact.      Conjunctiva/sclera: Conjunctivae normal.      Pupils: Pupils are equal, round, and reactive to light.   Neck:      Comments: Tenderness over C5-C6 left aspect of the posterior neck.  Positive tenderness over  the left trapezius muscle.  Cardiovascular:      Rate and Rhythm: Normal rate and regular rhythm.      Pulses: Normal pulses.      Heart sounds: Normal heart sounds. No murmur heard.  Pulmonary:      Effort: Pulmonary effort is normal. No respiratory distress.      Breath sounds: Normal breath sounds.   Abdominal:      General: Bowel sounds are normal.      Palpations: Abdomen is soft.      Tenderness: There is no abdominal tenderness.   Musculoskeletal:         General: Tenderness present. No swelling.      Cervical back: Neck supple. Tenderness present.      Comments: Tenderness over the posterior left shoulder radiating onto the superior aspect of the shoulder.  No swelling or bruising.  No crepitus or deformity.   Skin:     General: Skin is warm and dry.      Capillary Refill: Capillary refill takes less than 2 seconds.   Neurological:      General: No focal  deficit present.      Mental Status: She is alert and oriented to person, place, and time.   Psychiatric:         Mood and Affect: Mood normal.         Behavior: Behavior normal.         Thought Content: Thought content normal.     Patient Data     Labs Ordered/Reviewed   COMPREHENSIVE METABOLIC PANEL, NON-FASTING - Abnormal; Notable for the following components:       Result Value    SODIUM 135 (*)     CHLORIDE 95 (*)     BUN 30 (*)     CREATININE 7.54 (*)     ESTIMATED GFR 6 (*)     ALBUMIN 3.1 (*)     All other components within normal limits    Narrative:     Estimated Glomerular Filtration Rate (eGFR) is calculated using the CKD-EPI (2021) equation, intended for patients 80 years of age and older. If gender is not documented or "unknown", there will be no eGFR calculation.   COVID-19, FLU A/B, RSV RAPID BY PCR - Normal    Narrative:     Results are for the simultaneous qualitative identification of SARS-CoV-2 (formerly 2019-nCoV), Influenza A, Influenza B, and RSV RNA. These etiologic agents are generally detectable in nasopharyngeal and nasal swabs during the ACUTE PHASE of infection. Hence, this test is intended to be performed on respiratory specimens collected from individuals with signs and symptoms of upper respiratory tract infection who meet Centers for Disease Control and Prevention (CDC) clinical and/or epidemiological criteria for Coronavirus Disease 2019 (COVID-19) testing. CDC COVID-19 criteria for testing on human specimens is available at [facility] webpage information for Healthcare Professionals: Coronavirus Disease 2019 (COVID-19) ([email or web address]).     False-negative results may occur if the virus has genomic mutations, insertions, deletions, or rearrangements or if performed very early in the course of illness. Otherwise, negative results indicate virus specific RNA targets are not detected, however negative results do not preclude SARS-CoV-2  infection/COVID-19, Influenza, or Respiratory syncytial virus infection. Results should not be used as the sole basis for patient management decisions. Negative results must be combined with clinical observations, patient history, and epidemiological information. If upper respiratory tract infection is still suspected based on exposure history together with other clinical findings, re-testing should be considered.  Disclaimer:   This assay has been authorized by FDA under an Emergency Use Authorization for use in laboratories certified under the Clinical Laboratory Improvement Amendments of 1988 (CLIA), 42 U.S.C. 9403475506, to perform high complexity tests. The impacts of vaccines, antiviral therapeutics, antibiotics, chemotherapeutic or immunosuppressant drugs have not been evaluated.     Test methodology:   Cepheid Xpert Xpress SARS-CoV-2/Flu/RSV Assay real-time polymerase chain reaction (RT-PCR) test on the GeneXpert Dx and Xpert Xpress systems.     CT CERVICAL SPINE WO IV CONTRAST   Final Result by Edi, Radresults In (09/16 0850)   NO ACUTE CERVICAL FRACTURE         One or more dose reduction techniques were used (e.g., Automated exposure control, adjustment of the mA and/or kV according to patient size, use of iterative reconstruction technique).         Radiologist location ID: [id number]         CT BRAIN WO IV CONTRAST   Final Result by Edi, Radresults In (09/16 0846)   NO ACUTE FINDINGS         One or more dose reduction techniques were used (e.g., Automated exposure control, adjustment of the mA and/or kV according to patient size, use of iterative reconstruction technique).         Radiologist location ID: [id number]         XR SHOULDER LEFT   Final Result by Edi, Radresults In (09/16 0801)   NO ACUTE FRACTURE OR DISLOCATION.                Radiologist location ID: [place] [facility]        Medical Decision Making  Patient is 46 year old black female with a history of dialysis  was on her way back when she was in her electric wheelchair and was going up the ramp where she lived when all of a sudden the wheels began to spin and it toppled over her onto her for proximally 35 minutes.  Bystander came and helped.  She is now complaining severe headache after hitting her head as well as neck pain left shoulder pain.  Patient is presently on Plavix as well as Eliquis.  She denies any numbness or tingling going into the arm.  No one-sided weakness.  She stated her whole left side is sore.  Patient will have a CT of the head neck and then an x-ray of her left shoulder.  Patient denied any cough for sore throat, earache, or myalgias/arthralgias.  Patient will then be given analgesia and more than likely sent home to see her PMD at a later date.    Amount and/or Complexity of Data Reviewed  Labs: ordered.  Radiology: ordered.  ECG/medicine tests: ordered.     Details: Normal sinus rhythm 71, PR interval 222 MS, first-degree AV block, QT interval 452 MS, incomplete right bundle-branch block, LVH, prolonged QT, poor R-wave progression through the precordium.    Risk  Prescription drug management.             Medications Administered in the ED   traMADol (ULTRAM) tablet (has no administration in time range)     Clinical Impression   Fall (Primary)   Contusion of multiple sites of left shoulder and upper arm, initial encounter   Acute strain of neck muscle, initial encounter   Closed head injury, initial encounter   Contusion of left temporofrontal scalp, initial encounter  Disposition: Discharged               Clinical Impression   Fall (Primary)   Contusion of multiple sites of left shoulder and upper arm, initial encounter   Acute strain of neck muscle, initial encounter   Closed head injury, initial encounter   Contusion of left temporofrontal scalp, initial encounter       Current Discharge Medication List        START taking these medications    Details   traMADoL (ULTRAM) 50 mg Oral Tablet  Take 1 Tablet (50 mg total) by mouth Every 6 hours as needed for Pain  Qty: 20 Tablet, Refills: 0

## 2021-11-09 NOTE — ED Triage Notes (Signed)
[place] [address] out of wheelchair yesterday and complains of generalized pain, worse on left side  of her body.

## 2021-11-09 NOTE — Result Encounter Note (Signed)
Phone number is disconnected.  No other number listed on facesheet.

## 2021-11-11 ENCOUNTER — Emergency Department (HOSPITAL_BASED_OUTPATIENT_CLINIC_OR_DEPARTMENT_OTHER): Payer: Medicare Other

## 2021-11-11 ENCOUNTER — Emergency Department (EMERGENCY_DEPARTMENT_HOSPITAL)
Admission: EM | Admit: 2021-11-11 | Discharge: 2021-11-12 | Discharge status: Against Medical Advice | Payer: Medicare Other | Source: Home / Self Care | Attending: Emergency Medicine | Admitting: Emergency Medicine

## 2021-11-11 ENCOUNTER — Encounter (HOSPITAL_BASED_OUTPATIENT_CLINIC_OR_DEPARTMENT_OTHER): Payer: Self-pay | Admitting: Family

## 2021-11-11 ENCOUNTER — Other Ambulatory Visit: Payer: Self-pay

## 2021-11-11 DIAGNOSIS — Z992 Dependence on renal dialysis: Secondary | ICD-10-CM | POA: Insufficient documentation

## 2021-11-11 DIAGNOSIS — N186 End stage renal disease: Secondary | ICD-10-CM

## 2021-11-11 DIAGNOSIS — R072 Precordial pain: Secondary | ICD-10-CM | POA: Insufficient documentation

## 2021-11-11 DIAGNOSIS — N185 Chronic kidney disease, stage 5: Secondary | ICD-10-CM

## 2021-11-11 DIAGNOSIS — E1122 Type 2 diabetes mellitus with diabetic chronic kidney disease: Secondary | ICD-10-CM | POA: Insufficient documentation

## 2021-11-11 DIAGNOSIS — D649 Anemia, unspecified: Secondary | ICD-10-CM | POA: Insufficient documentation

## 2021-11-11 DIAGNOSIS — I132 Hypertensive heart and chronic kidney disease with heart failure and with stage 5 chronic kidney disease, or end stage renal disease: Secondary | ICD-10-CM | POA: Insufficient documentation

## 2021-11-11 DIAGNOSIS — I509 Heart failure, unspecified: Secondary | ICD-10-CM | POA: Insufficient documentation

## 2021-11-11 DIAGNOSIS — R059 Cough, unspecified: Secondary | ICD-10-CM | POA: Insufficient documentation

## 2021-11-11 DIAGNOSIS — R509 Fever, unspecified: Secondary | ICD-10-CM | POA: Insufficient documentation

## 2021-11-11 DIAGNOSIS — I252 Old myocardial infarction: Secondary | ICD-10-CM | POA: Insufficient documentation

## 2021-11-11 DIAGNOSIS — Z20822 Contact with and (suspected) exposure to covid-19: Secondary | ICD-10-CM | POA: Insufficient documentation

## 2021-11-11 DIAGNOSIS — I25119 Atherosclerotic heart disease of native coronary artery with unspecified angina pectoris: Secondary | ICD-10-CM

## 2021-11-11 DIAGNOSIS — Z955 Presence of coronary angioplasty implant and graft: Secondary | ICD-10-CM | POA: Insufficient documentation

## 2021-11-11 DIAGNOSIS — R9431 Abnormal electrocardiogram [ECG] [EKG]: Secondary | ICD-10-CM | POA: Insufficient documentation

## 2021-11-11 DIAGNOSIS — Z5329 Procedure and treatment not carried out because of patient's decision for other reasons: Secondary | ICD-10-CM | POA: Insufficient documentation

## 2021-11-11 LAB — COMPREHENSIVE METABOLIC PANEL, NON-FASTING
ALBUMIN/GLOBULIN RATIO: 0.7 — ABNORMAL LOW (ref 0.8–1.4)
ALBUMIN: 3.1 g/dL — ABNORMAL LOW (ref 3.4–5.0)
ALKALINE PHOSPHATASE: 83 U/L (ref 46–116)
ALT (SGPT): <7 U/L (ref <=78)
ANION GAP: 7 mmol/L (ref 4–13)
AST (SGOT): 12 U/L — ABNORMAL LOW (ref 15–37)
BILIRUBIN TOTAL: 0.5 mg/dL (ref 0.2–1.0)
BUN/CREA RATIO: 4
BUN: 30 mg/dL — ABNORMAL HIGH (ref 7–18)
CALCIUM, CORRECTED: 10 mg/dL
CALCIUM: 9.1 mg/dL (ref 8.5–10.1)
CHLORIDE: 96 mmol/L — ABNORMAL LOW (ref 98–107)
CO2 TOTAL: 31 mmol/L (ref 21–32)
CREATININE: 7.2 mg/dL — ABNORMAL HIGH (ref 0.55–1.02)
ESTIMATED GFR: 7 mL/min/{1.73_m2} — ABNORMAL LOW (ref >59)
GLOBULIN: 4.7
GLUCOSE: 91 mg/dL (ref 74–106)
OSMOLALITY, CALCULATED: 274 mOsm/kg (ref 270–290)
POTASSIUM: 4 mmol/L (ref 3.5–5.1)
PROTEIN TOTAL: 7.8 g/dL (ref 6.4–8.2)
SODIUM: 134 mmol/L — ABNORMAL LOW (ref 136–145)

## 2021-11-11 LAB — COVID-19, FLU A/B, RSV RAPID BY PCR
INFLUENZA VIRUS TYPE A: NOT DETECTED
INFLUENZA VIRUS TYPE B: NOT DETECTED
RESPIRATORY SYNCTIAL VIRUS (RSV): NOT DETECTED
SARS-CoV-2: NOT DETECTED

## 2021-11-11 LAB — CBC WITH DIFF
BASOPHIL #: 0.03 10*3/uL (ref 0.00–0.30)
BASOPHIL %: 1 % (ref 0–3)
EOSINOPHIL #: 0.11 10*3/uL (ref 0.00–0.80)
EOSINOPHIL %: 2 % (ref 0–7)
HCT: 20.5 % — CL (ref 37.0–47.0)
HGB: 6.8 g/dL — CL (ref 12.5–16.0)
LYMPHOCYTE #: 0.89 10*3/uL — ABNORMAL LOW (ref 1.10–5.00)
LYMPHOCYTE %: 15 % — ABNORMAL LOW (ref 25–45)
MCH: 29.6 pg (ref 27.0–32.0)
MCHC: 32.9 g/dL (ref 32.0–36.0)
MCV: 89.8 fL (ref 78.0–99.0)
MONOCYTE #: 0.44 10*3/uL (ref 0.00–1.30)
MONOCYTE %: 7 % (ref 0–12)
MPV: 8.1 fL (ref 7.4–10.4)
NEUTROPHIL #: 4.53 10*3/uL (ref 1.80–8.40)
NEUTROPHIL %: 75 % (ref 40–76)
PLATELETS: 277 10*3/uL (ref 140–440)
RBC: 2.28 10*6/uL — ABNORMAL LOW (ref 4.20–5.40)
RDW: 20.1 % — ABNORMAL HIGH (ref 11.6–14.8)
WBC: 6 10*3/uL (ref 4.0–10.5)

## 2021-11-11 LAB — TROPONIN-I
TROPONIN I: 80 ng/L — ABNORMAL HIGH (ref <15)
TROPONIN I: 85 ng/L — ABNORMAL HIGH (ref <15)

## 2021-11-11 LAB — LACTIC ACID LEVEL W/ REFLEX FOR LEVEL >2.0: LACTIC ACID: 0.6 mmol/L (ref 0.4–2.0)

## 2021-11-11 LAB — PT/INR
INR: 1.13 — ABNORMAL HIGH (ref 0.88–1.10)
PROTHROMBIN TIME: 13.1 seconds — ABNORMAL HIGH (ref 9.8–12.7)

## 2021-11-11 LAB — MAGNESIUM: MAGNESIUM: 1.9 mg/dL (ref 1.8–2.4)

## 2021-11-11 LAB — PTT (PARTIAL THROMBOPLASTIN TIME): APTT: 58.9 seconds — ABNORMAL HIGH (ref 22.0–31.7)

## 2021-11-11 MED ORDER — ASPIRIN 81 MG CHEWABLE TABLET
324.0000 mg | CHEWABLE_TABLET | ORAL | Status: DC
Start: 2021-11-11 — End: 2021-11-11

## 2021-11-11 MED ORDER — FENTANYL (PF) 50 MCG/ML INJECTION SOLUTION
25.0000 ug | INTRAMUSCULAR | Status: AC
Start: 2021-11-11 — End: 2021-11-11
  Administered 2021-11-11: 25 ug via INTRAVENOUS

## 2021-11-11 MED ORDER — LABETALOL 5 MG/ML INTRAVENOUS SOLUTION
INTRAVENOUS | Status: AC
Start: 2021-11-11 — End: 2021-11-11
  Filled 2021-11-11: qty 20

## 2021-11-11 MED ORDER — SODIUM CHLORIDE 0.9 % IV BOLUS
40.0000 mL | INJECTION | Freq: Once | Status: AC | PRN
Start: 2021-11-11 — End: 2021-11-12

## 2021-11-11 MED ORDER — FENTANYL (PF) 50 MCG/ML INJECTION SOLUTION
INTRAMUSCULAR | Status: AC
Start: 2021-11-11 — End: 2021-11-11
  Filled 2021-11-11: qty 2

## 2021-11-11 MED ORDER — LABETALOL 5 MG/ML INTRAVENOUS SOLUTION
5.0000 mg | Freq: Once | INTRAVENOUS | Status: AC
Start: 2021-11-12 — End: 2021-11-11
  Administered 2021-11-11: 5 mg via INTRAVENOUS

## 2021-11-11 NOTE — ED Nurses Note (Signed)
Pt states she is still in pain. Provider notified.

## 2021-11-11 NOTE — ED Provider Notes (Signed)
[facility], [facility] Emergency Department  ED Primary Provider Note  History of Present Illness   Chief Complaint   Patient presents with    Chest Pain      [name] is a 46 y.o. female who had concerns including Chest Pain .  Arrival: The patient arrived by Ambulance    Patient 46 year old female presents emergency room after dialysis complaining of left-sided chest pain disorder proximally 3 hours post dialysis.  Patient has CKD and receives dialysis Monday Wednesday Friday.  Patient states today after dialysis she developed chest pain and "just does not feel it. " patient was brought in by EMS and was given 324 of aspirin and 3 nitro.  Patient states that the nitroglycerin did not relieve chest pain.  Patient states she has history of CKD, MI with stent placement 1 week prior to arrival by Dr. Geraldo Pitter, hyperlipidemia, hypertension, CHF, and diabetes.    Review of Systems   Pertinent positive and negative ROS as per HPI.  Historical Data   History Reviewed This Encounter: Medical History  Surgical History  Family History  Social History    Physical Exam   ED Triage Vitals [11/11/21 2135]   BP (Non-Invasive) (!) 165/103   Heart Rate 83   Respiratory Rate (!) 25   Temperature 37.3 C (99.1 F)   SpO2 98 %   Weight 81.6 kg (180 lb)   Height 1.702 m (5' 7" )     Physical Exam  Vitals and nursing note reviewed.   Constitutional:       General: She is not in acute distress.     Appearance: She is well-developed.   HENT:      Head: Normocephalic and atraumatic.   Eyes:      Conjunctiva/sclera: Conjunctivae normal.   Cardiovascular:      Rate and Rhythm: Normal rate and regular rhythm.      Heart sounds: No murmur heard.  Pulmonary:      Effort: Pulmonary effort is normal. No respiratory distress.      Breath sounds: Normal breath sounds.   Abdominal:      Palpations: Abdomen is soft.      Tenderness: There is no abdominal tenderness.   Musculoskeletal:         General: No  swelling.      Cervical back: Neck supple.   Skin:     General: Skin is warm and dry.      Capillary Refill: Capillary refill takes less than 2 seconds.   Neurological:      Mental Status: She is alert.   Psychiatric:         Mood and Affect: Mood normal.     Patient Data     Labs Ordered/Reviewed   COMPREHENSIVE METABOLIC PANEL, NON-FASTING - Abnormal; Notable for the following components:       Result Value    SODIUM 134 (*)     CHLORIDE 96 (*)     BUN 30 (*)     CREATININE 7.20 (*)     ESTIMATED GFR 7 (*)     ALBUMIN 3.1 (*)     AST (SGOT) 12 (*)     ALBUMIN/GLOBULIN RATIO 0.7 (*)     All other components within normal limits    Narrative:     Above range, check for dilution.  Estimated Glomerular Filtration Rate (eGFR) is calculated using the CKD-EPI (2021) equation, intended for patients 59 years of age and older. If gender  is not documented or "unknown", there will be no eGFR calculation.   TROPONIN-I - Abnormal; Notable for the following components:    TROPONIN I 80 (*)     All other components within normal limits    Narrative:     Values received on females ranging between 12-15 ng/L MUST include the next serial troponin to review changes in the delta differences as the reference range for the Access II chemistry analyzer is lower than the established reference range.     TROPONIN-I - Abnormal; Notable for the following components:    TROPONIN I 85 (*)     All other components within normal limits    Narrative:     Values received on females ranging between 12-15 ng/L MUST include the next serial troponin to review changes in the delta differences as the reference range for the Access II chemistry analyzer is lower than the established reference range.     PT/INR - Abnormal; Notable for the following components:    PROTHROMBIN TIME 13.1 (*)     INR 1.13 (*)     All other components within normal limits   PTT (PARTIAL THROMBOPLASTIN TIME) - Abnormal; Notable for the following components:    APTT 58.9 (*)      All other components within normal limits   CBC WITH DIFF - Abnormal; Notable for the following components:    RBC 2.28 (*)     HGB 6.8 (*)     HCT 20.5 (*)     RDW 20.1 (*)     LYMPHOCYTE % 15 (*)     LYMPHOCYTE # 0.89 (*)     All other components within normal limits   MAGNESIUM - Normal   COVID-19, FLU A/B, RSV RAPID BY PCR - Normal    Narrative:     Results are for the simultaneous qualitative identification of SARS-CoV-2 (formerly 2019-nCoV), Influenza A, Influenza B, and RSV RNA. These etiologic agents are generally detectable in nasopharyngeal and nasal swabs during the ACUTE PHASE of infection. Hence, this test is intended to be performed on respiratory specimens collected from individuals with signs and symptoms of upper respiratory tract infection who meet Centers for Disease Control and Prevention (CDC) clinical and/or epidemiological criteria for Coronavirus Disease 2019 (COVID-19) testing. CDC COVID-19 criteria for testing on human specimens is available at [facility] webpage information for Healthcare Professionals: Coronavirus Disease 2019 (COVID-19) ([email or web address][email or web address]).     False-negative results may occur if the virus has genomic mutations, insertions, deletions, or rearrangements or if performed very early in the course of illness. Otherwise, negative results indicate virus specific RNA targets are not detected, however negative results do not preclude SARS-CoV-2 infection/COVID-19, Influenza, or Respiratory syncytial virus infection. Results should not be used as the sole basis for patient management decisions. Negative results must be combined with clinical observations, patient history, and epidemiological information. If upper respiratory tract infection is still suspected based on exposure history together with other clinical findings, re-testing should be considered.    Disclaimer:   This assay has been authorized by FDA under an Emergency Use Authorization  for use in laboratories certified under the Clinical Laboratory Improvement Amendments of 1988 (CLIA), 42 U.S.C. [phone], to perform high complexity tests. The impacts of vaccines, antiviral therapeutics, antibiotics, chemotherapeutic or immunosuppressant drugs have not been evaluated.     Test methodology:   Cepheid Xpert Xpress SARS-CoV-2/Flu/RSV Assay real-time polymerase chain reaction (RT-PCR) test on the GeneXpert Dx and Xpert Xpress systems.   LACTIC ACID  LEVEL W/ REFLEX FOR LEVEL >2.0 - Normal   CBC/DIFF    Narrative:     The following orders were created for panel order CBC/DIFF.  Procedure                               Abnormality         Status                     ---------                               -----------         ------                     CBC WITH LAGT[[id number]]                Abnormal            Final result                 Please view results for these tests on the individual orders.   TROPONIN-I   CROSSMATCH RED CELLS - UNITS   TYPE AND SCREEN     XR CHEST AP   Final Result by Edi, Radresults In (09/18 2217)   Pulmonary edema         Radiologist location ID: [id number] [facility]        Medical Decision Making  Patient 46 year old female presents emergency room after dialysis complaining of left-sided chest pain disorder proximally 3 hours post dialysis.  Patient was discharged from currently in clinic in [place] on non 4 status post stent placement.  Patient had echocardiogram with stent placement on September 4th in the mid LAD with no complications.  On echocardiogram that was done on September 4th patient ejection fraction greater than 75%.  Patient to ED today complaining of the substernal chest pain as well as cough and low-grade fever.  Differential diagnosis include but are not limited to STEMI, NSTEMI, PE, aortic aneurysm, pneumonia, pleurisy.  Physical examination no swelling noted to lower extremities.  Respirations equal and nonlabored with clear lung  fields bilaterally.  Patient has a low-grade fever of 99.1.  Patient was given 3 nitro with no relief prior to arrival.  Patient was also given 324 of aspirin.  Initial troponin of 80.  Last troponin on 09/13 of 92.  Creatinine elevated at 7.2 with last creatinine of 7.4 on 09/13 as well.  Chest x-ray shows pulmonary edema which is stable and subsequently seen on all chest x-rays over the past 4 months.  COVID, flu, RSV all negative at this time.  Hemoglobin low at 6.2.  2 units of PRBCs ordered for patient.  Patient had a EGD on 09/13 showed no bleeding and was to follow up outpatient for colonoscopy.  Patient has chronic anemia and is currently denying any source bleeding including and stool at this time.  Patient's last hemoglobin at [facility] on 09/13 was 7.2.  Patient signed off to Dr. Nelson Chimes pending disposition.    Amount and/or Complexity of Data Reviewed  Labs: ordered.  Radiology: ordered.  ECG/medicine tests: ordered.    Risk  Prescription drug management.  Parenteral controlled substances.             Medications Administered  in the ED   fentaNYL (SUBLIMAZE) 50 mcg/mL injection (has no administration in time range)   NS bolus infusion 40 mL (has no administration in time range)   labetalol (TRANDATE) 5 mg/mL injection (has no administration in time range)     Clinical Impression   Anemia, unspecified type (Primary)   CKD (chronic kidney disease) stage 5, GFR less than 15 ml/min (CMS HCC)       Disposition: Data Unavailable     This patient was treated criticall. This includes evaluation of vital signs, direct patient contact, evaluating the need for and adjusting medication/dosing. This also included respiratory monitoring, and management. Also included is discussion with other providers, providing documentation/charting, review of the medical record, and observation of the patient while in the emergency department. This time does not include procedures. The time is an approximation of the minimum time spent  caring for the patient.        Approximated minimal time spent: >30  minutes.

## 2021-11-11 NOTE — ED Triage Notes (Signed)
Left sided chest pain that started after dialysis, had 3 nitro, 324 aspirin and fentanyl.

## 2021-11-11 NOTE — ED Nurses Note (Addendum)
Pt states that she had dialysis today and started to feel chest pain afterwards. Pt describes pain as aching and dull and rates as a 7 out of 10. Pt also has a cough that she states started on Friday. C/o feeling weak and fatigued last few days and feverish with body aches all over. Denies any GI symptoms at this time. Pt talking on phone at this time. Call light within reach at bedside.

## 2021-11-11 NOTE — ED Nurses Note (Signed)
Critical H&H 6.8 & 20.5 rec. From lab; provider notified.

## 2021-11-12 ENCOUNTER — Emergency Department (HOSPITAL_BASED_OUTPATIENT_CLINIC_OR_DEPARTMENT_OTHER): Payer: Medicare Other

## 2021-11-12 ENCOUNTER — Other Ambulatory Visit: Payer: Self-pay

## 2021-11-12 LAB — ECG 12 LEAD
Atrial Rate: 97 {beats}/min
Calculated P Axis: 83 degrees
Calculated R Axis: 34 degrees
Calculated T Axis: 90 degrees
PR Interval: 206 ms
QRS Duration: 116 ms
QT Interval: 384 ms
QTC Calculation: 487 ms
Ventricular rate: 97 {beats}/min

## 2021-11-12 LAB — H & H
HCT: 27.9 % — ABNORMAL LOW (ref 37.0–47.0)
HGB: 9.2 g/dL — ABNORMAL LOW (ref 12.5–16.0)

## 2021-11-12 LAB — TROPONIN-I: TROPONIN I: 82 ng/L — ABNORMAL HIGH (ref <15)

## 2021-11-12 MED ORDER — LABETALOL 200 MG TABLET
ORAL_TABLET | ORAL | Status: AC
Start: 2021-11-12 — End: 2021-11-12
  Filled 2021-11-12: qty 2

## 2021-11-12 MED ORDER — FUROSEMIDE 10 MG/ML INJECTION SOLUTION
INTRAMUSCULAR | Status: AC
Start: 2021-11-12 — End: 2021-11-12
  Filled 2021-11-12: qty 8

## 2021-11-12 MED ORDER — HYDRALAZINE 50 MG TABLET
100.0000 mg | ORAL_TABLET | ORAL | Status: AC
Start: 2021-11-12 — End: 2021-11-12
  Administered 2021-11-12: 100 mg via ORAL

## 2021-11-12 MED ORDER — FENTANYL (PF) 50 MCG/ML INJECTION SOLUTION
75.0000 ug | INTRAMUSCULAR | Status: AC
Start: 2021-11-12 — End: 2021-11-12
  Administered 2021-11-12: 75 ug via INTRAVENOUS

## 2021-11-12 MED ORDER — IPRATROPIUM 0.5 MG-ALBUTEROL 3 MG (2.5 MG BASE)/3 ML NEBULIZATION SOLN
3.0000 mL | INHALATION_SOLUTION | RESPIRATORY_TRACT | Status: AC
Start: 2021-11-12 — End: 2021-11-12
  Administered 2021-11-12: 3 mL via RESPIRATORY_TRACT

## 2021-11-12 MED ORDER — IPRATROPIUM 0.5 MG-ALBUTEROL 3 MG (2.5 MG BASE)/3 ML NEBULIZATION SOLN
INHALATION_SOLUTION | RESPIRATORY_TRACT | Status: AC
Start: 2021-11-12 — End: 2021-11-12
  Filled 2021-11-12: qty 3

## 2021-11-12 MED ORDER — HYDRALAZINE 50 MG TABLET
ORAL_TABLET | ORAL | Status: AC
Start: 2021-11-12 — End: 2021-11-12
  Filled 2021-11-12: qty 2

## 2021-11-12 MED ORDER — FENTANYL (PF) 50 MCG/ML INJECTION SOLUTION
INTRAMUSCULAR | Status: AC
Start: 2021-11-12 — End: 2021-11-12
  Filled 2021-11-12: qty 2

## 2021-11-12 MED ORDER — BUPRENORPHINE HCL 0.3 MG/ML INJECTION SOLUTION
INTRAMUSCULAR | Status: AC
Start: 2021-11-12 — End: 2021-11-12
  Filled 2021-11-12: qty 1

## 2021-11-12 MED ORDER — HYDROCODONE 5 MG-ACETAMINOPHEN 325 MG TABLET
ORAL_TABLET | ORAL | Status: AC
Start: 2021-11-12 — End: 2021-11-12
  Filled 2021-11-12: qty 2

## 2021-11-12 MED ORDER — HYDROCODONE 5 MG-ACETAMINOPHEN 325 MG TABLET
2.0000 | ORAL_TABLET | ORAL | Status: AC
Start: 2021-11-12 — End: 2021-11-12
  Administered 2021-11-12: 2 via ORAL

## 2021-11-12 MED ORDER — PROCHLORPERAZINE EDISYLATE 10 MG/2 ML (5 MG/ML) INJECTION SOLUTION
10.0000 mg | INTRAMUSCULAR | Status: AC
Start: 2021-11-12 — End: 2021-11-12
  Administered 2021-11-12: 10 mg via INTRAVENOUS

## 2021-11-12 MED ORDER — LABETALOL 200 MG TABLET
400.0000 mg | ORAL_TABLET | ORAL | Status: AC
Start: 2021-11-12 — End: 2021-11-12
  Administered 2021-11-12: 400 mg via ORAL

## 2021-11-12 MED ORDER — BUPRENORPHINE HCL 0.3 MG/ML INJECTION SOLUTION
0.1500 mg | INTRAMUSCULAR | Status: AC
Start: 2021-11-12 — End: 2021-11-12
  Administered 2021-11-12: 0.15 mg via INTRAMUSCULAR

## 2021-11-12 MED ORDER — PROCHLORPERAZINE EDISYLATE 10 MG/2 ML (5 MG/ML) INJECTION SOLUTION
INTRAMUSCULAR | Status: AC
Start: 2021-11-12 — End: 2021-11-12
  Filled 2021-11-12: qty 2

## 2021-11-12 MED ORDER — FUROSEMIDE 10 MG/ML INJECTION SOLUTION
80.0000 mg | Freq: Once | INTRAMUSCULAR | Status: AC
Start: 2021-11-12 — End: 2021-11-12
  Administered 2021-11-12: 80 mg via INTRAVENOUS

## 2021-11-12 NOTE — ED Nurses Note (Signed)
Blood infusion complete, patient has a wet cough and states "I feel like I am going into fluid overload and if I get the 2nd unit I will be in fluid overload. Holding 2nd unit of PRBC due to fluid overload.

## 2021-11-12 NOTE — ED Attending Handoff Note (Signed)
MDM:    ED Course as of 11/12/21 0729   Mon Nov 11, 2021   2244 NT-PROBNP   Tue Nov 12, 2021   0200 Patient resting comfortably.  Awaiting blood.  She is chest pain-free   0729 Patient currently receiving blood at this time.  Plan is for discharge following completion.  All pertinent details will be signed out to oncoming provider for continued management and final disposition.        Critical Care Time:  Total critical care time spent in direct care of this patient at high risk based on presenting history/exam/and complaint, including initial evaluation and stabilization, review of data, re-examination, discussion with admitting and consulting services to arrange definitive care, and exclusive of any procedures performed, was 30 minutes.    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        Details   acetaminophen 325 mg Tablet  Commonly known as: TYLENOL   650 mg, Oral, EVERY 4 HOURS PRN  Refills: 0     amLODIPine 10 mg Tablet  Commonly known as: NORVASC   10 mg, Oral, DAILY, Take one tablet every day  Refills: 0     apixaban 2.5 mg Tablet  Commonly known as: ELIQUIS   2.5 mg, Oral, 2 TIMES DAILY  Qty: 60 Tablet  Refills: 0     budesonide-formoteroL 160-4.5 mcg/actuation oral inhaler  Commonly known as: SYMBICORT   2 Puffs, Inhalation, 2 TIMES DAILY  Refills: 0     calcitrioL 0.5 mcg Capsule  Commonly known as: ROCALTROL   1  mcg, Oral, EVERY MO, WE AND FR  Refills: 0     calcium acetate(phosphat bind) 667 mg Capsule  Commonly known as: PHOSLO   1,334 mg, Oral, 3 TIMES DAILY WITH MEALS  Qty: 180 Capsule  Refills: 0     cinacalcet 30 mg Tablet  Commonly known as: SENSIPAR   30 mg, Oral, EVERY EVENING  Refills: 0     cloNIDine 0.3 mg/24 hr Patch Weekly  Commonly known as: CATAPRES-TTS   0.3 mg, Transdermal, EVERY 7 DAYS  Refills: 0     cloNIDine HCL 0.2 mg Tablet  Commonly known as: CATAPRES   0.2 mg, Oral, 2 TIMES DAILY, Take one tablet twice a day  Refills: 0     clopidogreL 75 mg Tablet  Commonly known as: PLAVIX   75 mg, Oral, DAILY  Refills: 0     cyanocobalamin 1,000 mcg Tablet  Commonly known as: VITAMIN B 12   1,000 mcg, Oral, DAILY WITH LUNCH  Refills: 0     cyclobenzaprine 5 mg Tablet  Commonly known as: FLEXERIL   5 mg, Oral, 2 TIMES DAILY PRN  Refills: 0     diphenhydrAMINE 50 mg Capsule  Commonly known as: BENADRYL   50 mg, Oral, EVERY 6  HOURS PRN  Refills: 0     doxazosin 4 mg Tablet  Commonly known as: CARDURA   4 mg, Oral, EVERY EVENING  Refills: 0     epoetin alfa-epbx 10,000 unit/mL Solution  Commonly known as: RETACRIT   10,000 Units, Intravenous, GIVE IN DIALYSIS  Refills: 0     famotidine 40 mg Tablet  Commonly known as: PEPCID   40 mg, Oral, DAILY, Take one tablet once a day  Refills: 0     furosemide 80 mg Tablet  Commonly known as: LASIX   160 mg, Oral, DAILY, Take after dialysis on dialysis days; otherwise take in morning daily.   Refills: 0     hydrALAZINE 100 mg Tablet  Commonly known as: APRESOLINE   100 mg, Oral, EVERY 8 HOURS (SCHEDULED)  Qty: 90 Tablet  Refills: 0     ipratropium-albuteroL 0.5 mg-3 mg(2.5 mg base)/3 mL nebulizer solution  Commonly known as: DUONEB   3 mL, Nebulization, 4 TIMES DAILY PRN  Refills: 0     Isosorbide Mononitrate 120 mg Tablet Sustained Release 24 hr  Commonly known as: IMDUR   120 mg, Oral, EVERY MORNING  Qty: 30 Tablet  Refills: 0     labetaloL 300 mg Tablet  Commonly known as:  NORMODYNE   300 mg, Oral, 2 TIMES DAILY  Refills: 0     losartan 100 mg Tablet  Commonly known as: COZAAR   100 mg, Oral, NIGHTLY, Take one tablet everyday at bedtime  Refills: 0     Methylprednisolone 4 mg Tablets, Dose Pack  Commonly known as: MEDROL DOSEPACK   Take as instructed.  Qty: 21 Tablet  Refills: 0     montelukast 10 mg Tablet  Commonly known as: SINGULAIR   10 mg, Oral, EVERY EVENING  Refills: 0     nitroGLYCERIN 0.4 mg Tablet, Sublingual  Commonly known as: NITROSTAT   0.4 mg, Sublingual, EVERY 5 MIN PRN, for 3 doses over 15 minutes  Qty: 3 Tablet  Refills: 0     oxyCODONE-acetaminophen 5-325 mg Tablet  Commonly known as: PERCOCET   1 Tablet, Oral, EVERY 6 HOURS PRN  Refills: 0     pantoprazole 40 mg Tablet, Delayed Release (E.C.)  Commonly known as: PROTONIX   40 mg, Oral, DAILY  Refills: 0     rosuvastatin 40 mg Tablet  Commonly known as: CRESTOR   40 mg, Oral, EVERY EVENING, Take one tablet every day  Refills: 0     spironolactone 25 mg Tablet  Commonly known as: ALDACTONE   50 mg, Oral, 2 TIMES DAILY, Patient takes 2 tablets of 25 mg twice a day  Refills: 0     traMADoL 50 mg Tablet  Commonly known as: ULTRAM   50 mg, Oral, EVERY 6 HOURS PRN  Qty: 20 Tablet  Refills: 0            ASK your doctor about these medications.        Details   doxycycline 100 mg Tablet  Ask about: Should I take this medication?   100 mg, Oral, 2 TIMES DAILY  Qty: 10 Tablet  Refills: 0     lactulose 10 gram/15 mL Solution  Commonly known as: ENULOSE  Ask about: Should I take this medication?   30 mL, Oral, DAILY  Qty: 90 mL  Refills: 0

## 2021-11-12 NOTE — ED Attending Handoff Note (Addendum)
MDM:  Patient seen earlier this morning has crackles in both lungs from the mid portion down, and a cough.  She was re-evaluated and continues to have a cough, and shortness of breath.  Repeat chest x-ray does show pulmonary edema, similar to last night, she continues to cough.  She is not had her morning medications, blood pressure is elevated and she has significant pain with cough, chest and abdomen and back pain, which is chronic but worse today.  Said her lungs have crackles, heart regular rate rhythm with a soft mid systolic murmur no gallop, abdomen was soft, mild tenderness without guarding.  She does have peripheral edema, 1+ not significant currently.  She will be given nebulizer treatment, Buprenex, and will be going to [facility] for dialysis at 2:00 p.m. to give her more blood along with pulling more fluid from dialysis.  Case was discussed with Dr.Eter who has set up the dialysis, and the blood transfusion for the patient.    16 10:  Revisit with the patient, she does not want to go to [place] and is breathing much better.  She does continue to have greatly elevated blood pressure, has not had her medications for the day yet, and pain all over but no specific chest pressure, heaviness or palpitations.  We will give her her hydralazine and labetalol now along with her Lasix, she is going to let me get a hemoglobin and hematocrit prior to her leaving the hospital, but is wanting AMA papers since she has dialysis early in the morning.  Her lungs do have basilar crackles, fair aeration otherwise, heart has regular rate and rhythm normal S1-S2 with a very soft mid systolic murmur.  Patient is not coughing any longer, not tachypneic and says she is breathing much easier.  Labs reviewed, H&H, prior to her AMA.  Patient's hemoglobin is 9.2, she can follow up with a regular dialysis in the morning.      Data Unavailable  Clinical Impression   Anemia, unspecified type (Primary)   CKD (chronic kidney disease)  stage 5, GFR less than 15 ml/min (CMS HCC)   Dialysis patient (CMS HCC)     Medications Administered in the ED   NS bolus infusion 40 mL (has no administration in time range)   fentaNYL (SUBLIMAZE) 50 mcg/mL injection (25 mcg Intravenous Given 11/11/21 2342)   labetalol (TRANDATE) 5 mg/mL injection (5 mg Intravenous Given 11/11/21 2342)   fentaNYL (SUBLIMAZE) 50 mcg/mL injection (75 mcg Intravenous Given 11/12/21 0539)        Current Discharge Medication List        CONTINUE these medications - NO CHANGES were made during your visit.        Details   acetaminophen 325 mg Tablet  Commonly known as: TYLENOL   650 mg, Oral, EVERY 4 HOURS PRN  Refills: 0     amLODIPine 10 mg Tablet  Commonly known as: NORVASC   10 mg, Oral, DAILY, Take one tablet every day  Refills: 0     apixaban 2.5 mg Tablet  Commonly known as: ELIQUIS   2.5 mg, Oral, 2 TIMES DAILY  Qty: 60 Tablet  Refills: 0     budesonide-formoteroL 160-4.5 mcg/actuation oral inhaler  Commonly known as: SYMBICORT   2 Puffs, Inhalation, 2 TIMES DAILY  Refills: 0     calcitrioL 0.5 mcg Capsule  Commonly known as: ROCALTROL   1 mcg, Oral, EVERY MO, WE AND FR  Refills: 0     calcium acetate(phosphat  bind) 667 mg Capsule  Commonly known as: PHOSLO   1,334 mg, Oral, 3 TIMES DAILY WITH MEALS  Qty: 180 Capsule  Refills: 0     cinacalcet 30 mg Tablet  Commonly known as: SENSIPAR   30 mg, Oral, EVERY EVENING  Refills: 0     cloNIDine 0.3 mg/24 hr Patch Weekly  Commonly known as: CATAPRES-TTS   0.3 mg, Transdermal, EVERY 7 DAYS  Refills: 0     cloNIDine HCL 0.2 mg Tablet  Commonly known as: CATAPRES   0.2 mg, Oral, 2 TIMES DAILY, Take one tablet twice a day  Refills: 0     clopidogreL 75 mg Tablet  Commonly known as: PLAVIX   75 mg, Oral, DAILY  Refills: 0     cyanocobalamin 1,000 mcg Tablet  Commonly known as: VITAMIN B 12   1,000 mcg, Oral, DAILY WITH LUNCH  Refills: 0     cyclobenzaprine 5 mg Tablet  Commonly known as: FLEXERIL   5 mg, Oral, 2 TIMES DAILY PRN  Refills: 0      diphenhydrAMINE 50 mg Capsule  Commonly known as: BENADRYL   50 mg, Oral, EVERY 6 HOURS PRN  Refills: 0     doxazosin 4 mg Tablet  Commonly known as: CARDURA   4 mg, Oral, EVERY EVENING  Refills: 0     epoetin alfa-epbx 10,000 unit/mL Solution  Commonly known as: RETACRIT   10,000 Units, Intravenous, GIVE IN DIALYSIS  Refills: 0     famotidine 40 mg Tablet  Commonly known as: PEPCID   40 mg, Oral, DAILY, Take one tablet once a day  Refills: 0     furosemide 80 mg Tablet  Commonly known as: LASIX   160 mg, Oral, DAILY, Take after dialysis on dialysis days; otherwise take in morning daily.   Refills: 0     hydrALAZINE 100 mg Tablet  Commonly known as: APRESOLINE   100 mg, Oral, EVERY 8 HOURS (SCHEDULED)  Qty: 90 Tablet  Refills: 0     ipratropium-albuteroL 0.5 mg-3 mg(2.5 mg base)/3 mL nebulizer solution  Commonly known as: DUONEB   3 mL, Nebulization, 4 TIMES DAILY PRN  Refills: 0     Isosorbide Mononitrate 120 mg Tablet Sustained Release 24 hr  Commonly known as: IMDUR   120 mg, Oral, EVERY MORNING  Qty: 30 Tablet  Refills: 0     labetaloL 300 mg Tablet  Commonly known as: NORMODYNE   300 mg, Oral, 2 TIMES DAILY  Refills: 0     losartan 100 mg Tablet  Commonly known as: COZAAR   100 mg, Oral, NIGHTLY, Take one tablet everyday at bedtime  Refills: 0     Methylprednisolone 4 mg Tablets, Dose Pack  Commonly known as: MEDROL DOSEPACK   Take as instructed.  Qty: 21 Tablet  Refills: 0     montelukast 10 mg Tablet  Commonly known as: SINGULAIR   10 mg, Oral, EVERY EVENING  Refills: 0     nitroGLYCERIN 0.4 mg Tablet, Sublingual  Commonly known as: NITROSTAT   0.4 mg, Sublingual, EVERY 5 MIN PRN, for 3 doses over 15 minutes  Qty: 3 Tablet  Refills: 0     oxyCODONE-acetaminophen 5-325 mg Tablet  Commonly known as: PERCOCET   1 Tablet, Oral, EVERY 6 HOURS PRN  Refills: 0     pantoprazole 40 mg Tablet, Delayed Release (E.C.)  Commonly known as: PROTONIX   40 mg, Oral, DAILY  Refills: 0     rosuvastatin  40 mg Tablet  Commonly  known as: CRESTOR   40 mg, Oral, EVERY EVENING, Take one tablet every day  Refills: 0     spironolactone 25 mg Tablet  Commonly known as: ALDACTONE   50 mg, Oral, 2 TIMES DAILY, Patient takes 2 tablets of 25 mg twice a day  Refills: 0     traMADoL 50 mg Tablet  Commonly known as: ULTRAM   50 mg, Oral, EVERY 6 HOURS PRN  Qty: 20 Tablet  Refills: 0            ASK your doctor about these medications.        Details   doxycycline 100 mg Tablet  Ask about: Should I take this medication?   100 mg, Oral, 2 TIMES DAILY  Qty: 10 Tablet  Refills: 0     lactulose 10 gram/15 mL Solution  Commonly known as: ENULOSE  Ask about: Should I take this medication?   30 mL, Oral, DAILY  Qty: 90 mL  Refills: 0

## 2021-11-12 NOTE — ED Nurses Note (Signed)
Provider notified of pts blood pressure

## 2021-11-12 NOTE — ED Nurses Note (Signed)
Patient informed she was on the waitlist for inpatient at [facility]. Patient states she wanted to go home. Provider in to see patient at this time.

## 2021-11-12 NOTE — ED Nurses Note (Signed)
Pt tolerating transfusion w/o difficulty at this time. Asked for food tray; provided pt with tray.

## 2021-11-12 NOTE — ED Nurses Note (Signed)
Provider notified of patient's blood pressure.  

## 2021-11-12 NOTE — ED Nurses Note (Signed)
Told pt that her dialysis care called and asked to make pt aware that with receiving 2 units of blood today to be careful with her fluid intake today as she could go into fluid overload and to be sure pt f/u with her dialysis tomorrow. Pt verbalized understanding.

## 2021-11-12 NOTE — ED Nurses Note (Signed)
Blood transfusion consent signed pt pt; copy placed in medical records tray

## 2021-11-12 NOTE — ED Nurses Note (Signed)
Dr Liliane Shi called and states patient needs to be in dialysis in [place] at 2pm and will receive blood there with dialysis. Dr Riki Sheer spoke briefly with Dr Liliane Shi and is aware of the plan.

## 2021-11-12 NOTE — ED Nurses Note (Signed)
Patient still asking to go home. Provider in to speak with patient informing her she would have to sign out AMA and the risks with going home including and up to death. Patient understands risk. Patient alert and oriented x4. Patient signed AMA form at this time. IV removed.

## 2021-11-12 NOTE — Result Encounter Note (Signed)
Pt is in er, she states dr Viann Shove is her cardiologist and she will call for apt, she verbalized understanding that she is to follow up for abnormal EKG results

## 2021-11-12 NOTE — Discharge Instructions (Addendum)
Going home with an [place] discharge, hemoglobin did come up with this 1 unit of blood or your today.  Follow-up with your dialysis at 8:00 a.m. in the morning as scheduled, should you have recurring of chest pain, dyspnea, cough then you need to call the ambulance and go to the nearest ER, preferably the emergency department at [facility] with the also have inpatient capabilities and dialysis capabilities.  Continue home medications, follow-up in the morning as scheduled.

## 2021-11-13 ENCOUNTER — Inpatient Hospital Stay
Admission: EM | Admit: 2021-11-13 | Discharge: 2021-11-14 | DRG: 291 | Disposition: A | Discharge status: Home / Self Care | Payer: Medicare Other | Attending: Internal Medicine | Admitting: Internal Medicine

## 2021-11-13 ENCOUNTER — Emergency Department (HOSPITAL_BASED_OUTPATIENT_CLINIC_OR_DEPARTMENT_OTHER): Payer: Medicare Other

## 2021-11-13 ENCOUNTER — Other Ambulatory Visit: Payer: Self-pay

## 2021-11-13 DIAGNOSIS — Z7982 Long term (current) use of aspirin: Secondary | ICD-10-CM

## 2021-11-13 DIAGNOSIS — F17200 Nicotine dependence, unspecified, uncomplicated: Secondary | ICD-10-CM | POA: Diagnosis present

## 2021-11-13 DIAGNOSIS — M899 Disorder of bone, unspecified: Secondary | ICD-10-CM

## 2021-11-13 DIAGNOSIS — E8770 Fluid overload, unspecified: Secondary | ICD-10-CM

## 2021-11-13 DIAGNOSIS — D631 Anemia in chronic kidney disease: Secondary | ICD-10-CM | POA: Diagnosis present

## 2021-11-13 DIAGNOSIS — R0601 Orthopnea: Secondary | ICD-10-CM

## 2021-11-13 DIAGNOSIS — J811 Chronic pulmonary edema: Secondary | ICD-10-CM

## 2021-11-13 DIAGNOSIS — Z86718 Personal history of other venous thrombosis and embolism: Secondary | ICD-10-CM

## 2021-11-13 DIAGNOSIS — J45909 Unspecified asthma, uncomplicated: Secondary | ICD-10-CM

## 2021-11-13 DIAGNOSIS — Z9049 Acquired absence of other specified parts of digestive tract: Secondary | ICD-10-CM

## 2021-11-13 DIAGNOSIS — Z90710 Acquired absence of both cervix and uterus: Secondary | ICD-10-CM

## 2021-11-13 DIAGNOSIS — K219 Gastro-esophageal reflux disease without esophagitis: Secondary | ICD-10-CM | POA: Diagnosis present

## 2021-11-13 DIAGNOSIS — J9621 Acute and chronic respiratory failure with hypoxia: Secondary | ICD-10-CM | POA: Diagnosis present

## 2021-11-13 DIAGNOSIS — I120 Hypertensive chronic kidney disease with stage 5 chronic kidney disease or end stage renal disease: Secondary | ICD-10-CM

## 2021-11-13 DIAGNOSIS — I160 Hypertensive urgency: Secondary | ICD-10-CM | POA: Diagnosis present

## 2021-11-13 DIAGNOSIS — N186 End stage renal disease: Secondary | ICD-10-CM | POA: Diagnosis present

## 2021-11-13 DIAGNOSIS — Z20822 Contact with and (suspected) exposure to covid-19: Secondary | ICD-10-CM | POA: Diagnosis present

## 2021-11-13 DIAGNOSIS — Z9981 Dependence on supplemental oxygen: Secondary | ICD-10-CM

## 2021-11-13 DIAGNOSIS — Z91158 Patient's noncompliance with renal dialysis for other reason: Secondary | ICD-10-CM

## 2021-11-13 DIAGNOSIS — Z7951 Long term (current) use of inhaled steroids: Secondary | ICD-10-CM

## 2021-11-13 DIAGNOSIS — I081 Rheumatic disorders of both mitral and tricuspid valves: Secondary | ICD-10-CM | POA: Diagnosis present

## 2021-11-13 DIAGNOSIS — Z79899 Other long term (current) drug therapy: Secondary | ICD-10-CM

## 2021-11-13 DIAGNOSIS — J449 Chronic obstructive pulmonary disease, unspecified: Secondary | ICD-10-CM | POA: Diagnosis present

## 2021-11-13 DIAGNOSIS — E118 Type 2 diabetes mellitus with unspecified complications: Secondary | ICD-10-CM

## 2021-11-13 DIAGNOSIS — Z7902 Long term (current) use of antithrombotics/antiplatelets: Secondary | ICD-10-CM

## 2021-11-13 DIAGNOSIS — I2510 Atherosclerotic heart disease of native coronary artery without angina pectoris: Secondary | ICD-10-CM | POA: Diagnosis present

## 2021-11-13 DIAGNOSIS — N189 Chronic kidney disease, unspecified: Secondary | ICD-10-CM | POA: Diagnosis present

## 2021-11-13 DIAGNOSIS — I252 Old myocardial infarction: Secondary | ICD-10-CM

## 2021-11-13 DIAGNOSIS — D649 Anemia, unspecified: Secondary | ICD-10-CM | POA: Diagnosis present

## 2021-11-13 DIAGNOSIS — I132 Hypertensive heart and chronic kidney disease with heart failure and with stage 5 chronic kidney disease, or end stage renal disease: Principal | ICD-10-CM | POA: Diagnosis present

## 2021-11-13 DIAGNOSIS — I5032 Chronic diastolic (congestive) heart failure: Secondary | ICD-10-CM | POA: Diagnosis present

## 2021-11-13 DIAGNOSIS — J9601 Acute respiratory failure with hypoxia: Secondary | ICD-10-CM | POA: Diagnosis present

## 2021-11-13 DIAGNOSIS — Z9889 Other specified postprocedural states: Secondary | ICD-10-CM

## 2021-11-13 DIAGNOSIS — Z955 Presence of coronary angioplasty implant and graft: Secondary | ICD-10-CM

## 2021-11-13 DIAGNOSIS — F17210 Nicotine dependence, cigarettes, uncomplicated: Secondary | ICD-10-CM | POA: Diagnosis present

## 2021-11-13 DIAGNOSIS — Z992 Dependence on renal dialysis: Secondary | ICD-10-CM

## 2021-11-13 LAB — ARTERIAL BLOOD GAS/LACTATE
%FIO2 (ARTERIAL): 40 %
BASE EXCESS (ARTERIAL): 0.6 mmol/L (ref <=2.0)
BICARBONATE (ARTERIAL): 26.5 mmol/L — ABNORMAL HIGH (ref 20.0–26.0)
CARBOXYHEMOGLOBIN: 3.8 % — ABNORMAL HIGH (ref <=1.5)
LACTATE: 0.3 mmol/L (ref <=2.0)
MET-HEMOGLOBIN: 0.5 % (ref <=2.0)
O2CT: 10.2 %
OXYHEMOGLOBIN: 89.5 % (ref 88.0–100.0)
PCO2 (ARTERIAL): 48 mm/Hg — ABNORMAL HIGH (ref 35–45)
PH (ARTERIAL): 7.35 (ref 7.35–7.45)
PO2 (ARTERIAL): 69 mm/Hg — ABNORMAL LOW (ref 80–100)

## 2021-11-13 LAB — COMPREHENSIVE METABOLIC PANEL, NON-FASTING
ALBUMIN/GLOBULIN RATIO: 0.7 — ABNORMAL LOW (ref 0.8–1.4)
ALBUMIN: 3.3 g/dL — ABNORMAL LOW (ref 3.4–5.0)
ALKALINE PHOSPHATASE: 89 U/L (ref 46–116)
ALT (SGPT): 9 U/L (ref <=78)
ANION GAP: 11 mmol/L (ref 4–13)
AST (SGOT): 31 U/L (ref 15–37)
BILIRUBIN TOTAL: 0.8 mg/dL (ref 0.2–1.0)
BUN/CREA RATIO: 4
BUN: 44 mg/dL — ABNORMAL HIGH (ref 7–18)
CALCIUM, CORRECTED: 10.1 mg/dL
CALCIUM: 9.4 mg/dL (ref 8.5–10.1)
CHLORIDE: 95 mmol/L — ABNORMAL LOW (ref 98–107)
CO2 TOTAL: 28 mmol/L (ref 21–32)
CREATININE: 9.84 mg/dL — ABNORMAL HIGH (ref 0.55–1.02)
ESTIMATED GFR: 5 mL/min/{1.73_m2} — ABNORMAL LOW (ref >59)
GLOBULIN: 4.8
GLUCOSE: 81 mg/dL (ref 74–106)
OSMOLALITY, CALCULATED: 279 mOsm/kg (ref 270–290)
POTASSIUM: 5.1 mmol/L (ref 3.5–5.1)
PROTEIN TOTAL: 8.1 g/dL (ref 6.4–8.2)
SODIUM: 134 mmol/L — ABNORMAL LOW (ref 136–145)

## 2021-11-13 LAB — CBC WITH DIFF
BASOPHIL #: 0.03 10*3/uL (ref 0.00–0.30)
BASOPHIL %: 0 % (ref 0–3)
EOSINOPHIL #: 0.2 10*3/uL (ref 0.00–0.80)
EOSINOPHIL %: 2 % (ref 0–7)
HCT: 26.3 % — ABNORMAL LOW (ref 37.0–47.0)
HGB: 8.6 g/dL — ABNORMAL LOW (ref 12.5–16.0)
LYMPHOCYTE #: 0.64 10*3/uL — ABNORMAL LOW (ref 1.10–5.00)
LYMPHOCYTE %: 8 % — ABNORMAL LOW (ref 25–45)
MCH: 29.9 pg (ref 27.0–32.0)
MCHC: 32.8 g/dL (ref 32.0–36.0)
MCV: 91.2 fL (ref 78.0–99.0)
MONOCYTE #: 0.69 10*3/uL (ref 0.00–1.30)
MONOCYTE %: 9 % (ref 0–12)
MPV: 9.2 fL (ref 7.4–10.4)
NEUTROPHIL #: 6.4 10*3/uL (ref 1.80–8.40)
NEUTROPHIL %: 80 % — ABNORMAL HIGH (ref 40–76)
PLATELETS: 177 10*3/uL (ref 140–440)
RBC: 2.88 10*6/uL — ABNORMAL LOW (ref 4.20–5.40)
RDW: 18.9 % — ABNORMAL HIGH (ref 11.6–14.8)
WBC: 8 10*3/uL (ref 4.0–10.5)

## 2021-11-13 LAB — COVID-19, FLU A/B, RSV RAPID BY PCR
INFLUENZA VIRUS TYPE A: NOT DETECTED
INFLUENZA VIRUS TYPE B: NOT DETECTED
RESPIRATORY SYNCTIAL VIRUS (RSV): NOT DETECTED
SARS-CoV-2: NOT DETECTED

## 2021-11-13 LAB — TROPONIN-I
TROPONIN I: 81 ng/L — ABNORMAL HIGH (ref <15)
TROPONIN I: 83 ng/L — ABNORMAL HIGH (ref <15)
TROPONIN I: 109 ng/L — ABNORMAL HIGH (ref <15)

## 2021-11-13 LAB — B-TYPE NATRIURETIC PEPTIDE (BNP),PLASMA: BNP: >35000 pg/mL

## 2021-11-13 MED ORDER — FUROSEMIDE 10 MG/ML INJECTION SOLUTION
40.0000 mg | INTRAMUSCULAR | Status: AC
Start: 2021-11-13 — End: 2021-11-13
  Administered 2021-11-13: 40 mg via INTRAVENOUS

## 2021-11-13 MED ORDER — ETHYL ALCOHOL 62 % (NOZIN NASAL SANITIZER) NASAL SOLUTION - BULK BOTTLE
1.0000 | Freq: Two times a day (BID) | NASAL | Status: DC
Start: 2021-11-13 — End: 2021-11-14
  Administered 2021-11-14 (×2): 1 via NASAL

## 2021-11-13 MED ORDER — FENTANYL (PF) 50 MCG/ML INJECTION SOLUTION
100.0000 ug | INTRAMUSCULAR | Status: AC
Start: 2021-11-13 — End: 2021-11-13
  Administered 2021-11-13: 100 ug via INTRAVENOUS

## 2021-11-13 MED ORDER — TRAMADOL 50 MG TABLET
50.0000 mg | ORAL_TABLET | Freq: Three times a day (TID) | ORAL | Status: DC | PRN
Start: 2021-11-13 — End: 2021-11-14
  Administered 2021-11-13: 50 mg via ORAL
  Filled 2021-11-13: qty 1

## 2021-11-13 MED ORDER — MIDODRINE 2.5 MG TABLET
5.0000 mg | ORAL_TABLET | Freq: Every day | ORAL | Status: DC | PRN
Start: 2021-11-13 — End: 2021-11-14
  Administered 2021-11-13: 5 mg via ORAL

## 2021-11-13 MED ORDER — FUROSEMIDE 10 MG/ML INJECTION SOLUTION
80.0000 mg | INTRAMUSCULAR | Status: DC
Start: 2021-11-13 — End: 2021-11-13

## 2021-11-13 MED ORDER — FUROSEMIDE 10 MG/ML INJECTION SOLUTION
INTRAMUSCULAR | Status: AC
Start: 2021-11-13 — End: 2021-11-13
  Filled 2021-11-13: qty 4

## 2021-11-13 MED ORDER — NITROGLYCERIN 0.4 MG SUBLINGUAL TABLET
0.4000 mg | SUBLINGUAL_TABLET | SUBLINGUAL | Status: DC
Start: 2021-11-13 — End: 2021-11-13
  Administered 2021-11-13 (×3): 0.4 mg via SUBLINGUAL

## 2021-11-13 MED ORDER — ASPIRIN 81 MG CHEWABLE TABLET
CHEWABLE_TABLET | ORAL | Status: AC
Start: 2021-11-13 — End: 2021-11-13
  Filled 2021-11-13: qty 4

## 2021-11-13 MED ORDER — NITROGLYCERIN 0.4 MG SUBLINGUAL TABLET
0.4000 mg | SUBLINGUAL_TABLET | SUBLINGUAL | Status: DC | PRN
Start: 2021-11-13 — End: 2021-11-14
  Filled 2021-11-13: qty 1

## 2021-11-13 MED ORDER — ASPIRIN 81 MG CHEWABLE TABLET
324.0000 mg | CHEWABLE_TABLET | ORAL | Status: DC
Start: 2021-11-13 — End: 2021-11-14
  Administered 2021-11-13: 0 mg via ORAL

## 2021-11-13 MED ORDER — MIDODRINE 2.5 MG TABLET
ORAL_TABLET | ORAL | Status: AC
Start: 2021-11-13 — End: 2021-11-13
  Filled 2021-11-13: qty 2

## 2021-11-13 MED ORDER — FENTANYL (PF) 50 MCG/ML INJECTION SOLUTION
INTRAMUSCULAR | Status: AC
Start: 2021-11-13 — End: 2021-11-13
  Filled 2021-11-13: qty 2

## 2021-11-13 MED ORDER — NITROGLYCERIN 0.4 MG SUBLINGUAL TABLET
SUBLINGUAL_TABLET | SUBLINGUAL | Status: AC
Start: 2021-11-13 — End: 2021-11-13
  Filled 2021-11-13: qty 1

## 2021-11-13 NOTE — ED Nurses Note (Signed)
Pt stated that her pain is still a 8/10 after 3 nitro. Provider made aware.

## 2021-11-13 NOTE — ED Nurses Note (Signed)
Pt resting with eye closed at this time. Resp even and non labored. Equal chest rise and fall. Pt tolerating BiPap well at this time.

## 2021-11-13 NOTE — ED Provider Notes (Addendum)
[facility], [facility] Emergency Department  ED Primary Provider Note  HPI:  [name] is a 46 y.o. female     Patient states she is been short of breath all day.  Also endorses chest pain left-sided 10/10.  Also endorses cough nonproductive.  Patient was seen here earlier today following dialysis for shortness of breath chest pain found to be critically anemic.  She was given 2 L of packed red cells.  Following blood transfusion documentation indicates that patient had signs and symptoms of pulmonary edema.  Plan was for what sounds like another session of dialysis however patient left against medical advice.  I did review records including most recent discharge summary.  Patient has a history of ESRD HD dependent, COPD CAD with recent PCI in 8/15 and stent placement.  Patient is COVID vaccinated.  Full code    ROS review and negative aside from stated in HPI.    Physical Exam:  ED Triage Vitals [11/13/21 0308]   BP (Non-Invasive) (!) 155/116   Heart Rate (!) 111   Respiratory Rate (!) 30   Temperature 37.1 C (98.7 F)   SpO2 (!) 84 %   Weight 81.6 kg (180 lb)   Height 1.753 m (5' 9" )     Patient appears acutely ill tachypneic tachycardic.  She appears to be in pain..  Pupils 3 mm equal round reactive.  Extraocular movements are intact.  Oropharynx is clear.  Mucous membranes moist.  Trachea midline.  Neck is supple.  JVD noted.  Heart has regular rate and rhythm without significant murmurs rubs or gallops.  Lungs are diffusely rhonchorous..  Dialysis catheter site clean dry  Abdomen soft nontender, nondistended.  Moving all extremities without difficulty.  No rash no edema.      Patient data:  Labs Ordered/Reviewed   COMPREHENSIVE METABOLIC PANEL, NON-FASTING - Abnormal; Notable for the following components:       Result Value    SODIUM 134 (*)     CHLORIDE 95 (*)     BUN 44 (*)     CREATININE 9.84 (*)     ESTIMATED GFR 5 (*)     ALBUMIN 3.3 (*)     ALBUMIN/GLOBULIN RATIO 0.7  (*)     All other components within normal limits    Narrative:     Estimated Glomerular Filtration Rate (eGFR) is calculated using the CKD-EPI (2021) equation, intended for patients 25 years of age and older. If gender is not documented or "unknown", there will be no eGFR calculation.   TROPONIN-I - Abnormal; Notable for the following components:    TROPONIN I 81 (*)     All other components within normal limits    Narrative:     Values received on females ranging between 12-15 ng/L MUST include the next serial troponin to review changes in the delta differences as the reference range for the Access II chemistry analyzer is lower than the established reference range.     TROPONIN-I - Abnormal; Notable for the following components:    TROPONIN I 83 (*)     All other components within normal limits    Narrative:     Values received on females ranging between 12-15 ng/L MUST include the next serial troponin to review changes in the delta differences as the reference range for the Access II chemistry analyzer is lower than the established reference range.     CBC WITH DIFF - Abnormal; Notable for the following components:  RBC 2.88 (*)     HGB 8.6 (*)     HCT 26.3 (*)     RDW 18.9 (*)     NEUTROPHIL % 80 (*)     LYMPHOCYTE % 8 (*)     LYMPHOCYTE # 0.64 (*)     All other components within normal limits   ARTERIAL BLOOD GAS/LACTATE - Abnormal; Notable for the following components:    PCO2 (ARTERIAL) 48 (*)     BICARBONATE (ARTERIAL) 26.5 (*)     CARBOXYHEMOGLOBIN 3.8 (*)     PO2 (ARTERIAL) 69 (*)     All other components within normal limits    Narrative:     15/8 R-18 40%   COVID-19, FLU A/B, RSV RAPID BY PCR - Normal    Narrative:     Results are for the simultaneous qualitative identification of SARS-CoV-2 (formerly 2019-nCoV), Influenza A, Influenza B, and RSV RNA. These etiologic agents are generally detectable in nasopharyngeal and nasal swabs during the ACUTE PHASE of infection. Hence, this test is intended to  be performed on respiratory specimens collected from individuals with signs and symptoms of upper respiratory tract infection who meet Centers for Disease Control and Prevention (CDC) clinical and/or epidemiological criteria for Coronavirus Disease 2019 (COVID-19) testing. CDC COVID-19 criteria for testing on human specimens is available at [facility] webpage information for Healthcare Professionals: Coronavirus Disease 2019 (COVID-19) ([email or web address]).     False-negative results may occur if the virus has genomic mutations, insertions, deletions, or rearrangements or if performed very early in the course of illness. Otherwise, negative results indicate virus specific RNA targets are not detected, however negative results do not preclude SARS-CoV-2 infection/COVID-19, Influenza, or Respiratory syncytial virus infection. Results should not be used as the sole basis for patient management decisions. Negative results must be combined with clinical observations, patient history, and epidemiological information. If upper respiratory tract infection is still suspected based on exposure history together with other clinical findings, re-testing should be considered.    Disclaimer:   This assay has been authorized by FDA under an Emergency Use Authorization for use in laboratories certified under the Clinical Laboratory Improvement Amendments of 1988 (CLIA), 42 U.S.C. [phone], to perform high complexity tests. The impacts of vaccines, antiviral therapeutics, antibiotics, chemotherapeutic or immunosuppressant drugs have not been evaluated.     Test methodology:   Cepheid Xpert Xpress SARS-CoV-2/Flu/RSV Assay real-time polymerase chain reaction (RT-PCR) test on the GeneXpert Dx and Xpert Xpress systems.   CBC/DIFF    Narrative:     The following orders were created for panel order CBC/DIFF.  Procedure                               Abnormality         Status                     ---------                                -----------         ------                     CBC WITH KVQQ[[id number]]                Abnormal            Final result  Please view results for these tests on the individual orders.   B-TYPE NATRIURETIC PEPTIDE   TROPONIN-I     No orders to display     EKG read by me shows a sinus tachycardia with a rate of 104, normal axis, QTC is 462.  I do not appreciate significant ST or T-wave changes no delta waves are S1 Q3 T3 pattern.  Significantly changed from prior    MDM:  Shortness of breath chest pain.  DX includes fluid overload ACS arrhythmia pneumonia.  Patient hypertensive to 182/118.  EKG shows no significant changes from prior.  Nonspecific.  Serial troponin at patient's baseline around 83.  Hemoglobin is 8.6 which is improved from measures prior to recent transfusion.  BNP is above detectable limit.  Remainder of labs CBC CMP COVID influenza negative or at patient's baseline. Chest x-ray shows bilateral airspace disease with sparing of the left upper lobe.  Findings consistent with edema versus pneumonia.   I did review these findings with the patient.  She was quite short of breath.  Immediately placed on BiPAP with good improvement in saturations and work of breathing.  Patient given nitroglycerin Lasix.  She would very little diuresis.  It was marginal improvement of her chest pain.  She was treated with fentanyl which helped her greatly.  Aspirin ordered but I believe she received aspirin prior to arrival .  Patient requires dialysis.  She was re-evaluated at proximally 15 minute intervals doing well on BiPAP.  Stable on BiPAP  She is now agreeable to admission.      ED Course as of 11/13/21 0655   Wed Nov 13, 2021   0453 I did speak to [facility] ED provider Haze Justin who kindly accepted the patient for transfer.        Critical Care Time:  Total critical care time spent in direct care of this patient at high risk based on presenting history/exam/and complaint, including  initial evaluation and stabilization, review of data, re-examination, discussion with admitting and consulting services to arrange definitive care, and exclusive of any procedures performed, was 30 minutes.    Admitted  Clinical Impression   Hypervolemia, unspecified hypervolemia type (Primary)   Hemodialysis patient (CMS HCC)     Medications Administered in the ED   aspirin chewable tablet 324 mg (0 mg Oral Not Given 11/13/21 0328)   nitroGLYCERIN (NITROSTAT) sublingual tablet (0.4 mg Sublingual Given 11/13/21 0355)   furosemide (LASIX) 10 mg/mL injection (40 mg Intravenous Given 11/13/21 0357)   fentaNYL (SUBLIMAZE) 50 mcg/mL injection (has no administration in time range)        Current Discharge Medication List        CONTINUE these medications - NO CHANGES were made during your visit.        Details   acetaminophen 325 mg Tablet  Commonly known as: TYLENOL   650 mg, Oral, EVERY 4 HOURS PRN  Refills: 0     amLODIPine 10 mg Tablet  Commonly known as: NORVASC   10 mg, Oral, DAILY, Take one tablet every day  Refills: 0     apixaban 2.5 mg Tablet  Commonly known as: ELIQUIS   2.5 mg, Oral, 2 TIMES DAILY  Qty: 60 Tablet  Refills: 0     budesonide-formoteroL 160-4.5 mcg/actuation oral inhaler  Commonly known as: SYMBICORT   2 Puffs, Inhalation, 2 TIMES DAILY  Refills: 0     calcitrioL 0.5 mcg Capsule  Commonly known as: ROCALTROL   1 mcg, Oral, EVERY  MO, WE AND FR  Refills: 0     calcium acetate(phosphat bind) 667 mg Capsule  Commonly known as: PHOSLO   1,334 mg, Oral, 3 TIMES DAILY WITH MEALS  Qty: 180 Capsule  Refills: 0     cinacalcet 30 mg Tablet  Commonly known as: SENSIPAR   30 mg, Oral, EVERY EVENING  Refills: 0     cloNIDine 0.3 mg/24 hr Patch Weekly  Commonly known as: CATAPRES-TTS   0.3 mg, Transdermal, EVERY 7 DAYS  Refills: 0     cloNIDine HCL 0.2 mg Tablet  Commonly known as: CATAPRES   0.2 mg, Oral, 2 TIMES DAILY, Take one tablet twice a day  Refills: 0     clopidogreL 75 mg Tablet  Commonly known as:  PLAVIX   75 mg, Oral, DAILY  Refills: 0     cyanocobalamin 1,000 mcg Tablet  Commonly known as: VITAMIN B 12   1,000 mcg, Oral, DAILY WITH LUNCH  Refills: 0     cyclobenzaprine 5 mg Tablet  Commonly known as: FLEXERIL   5 mg, Oral, 2 TIMES DAILY PRN  Refills: 0     diphenhydrAMINE 50 mg Capsule  Commonly known as: BENADRYL   50 mg, Oral, EVERY 6 HOURS PRN  Refills: 0     doxazosin 4 mg Tablet  Commonly known as: CARDURA   4 mg, Oral, EVERY EVENING  Refills: 0     epoetin alfa-epbx 10,000 unit/mL Solution  Commonly known as: RETACRIT   10,000 Units, Intravenous, GIVE IN DIALYSIS  Refills: 0     famotidine 40 mg Tablet  Commonly known as: PEPCID   40 mg, Oral, DAILY, Take one tablet once a day  Refills: 0     furosemide 80 mg Tablet  Commonly known as: LASIX   160 mg, Oral, DAILY, Take after dialysis on dialysis days; otherwise take in morning daily.   Refills: 0     hydrALAZINE 100 mg Tablet  Commonly known as: APRESOLINE   100 mg, Oral, EVERY 8 HOURS (SCHEDULED)  Qty: 90 Tablet  Refills: 0     ipratropium-albuteroL 0.5 mg-3 mg(2.5 mg base)/3 mL nebulizer solution  Commonly known as: DUONEB   3 mL, Nebulization, 4 TIMES DAILY PRN  Refills: 0     Isosorbide Mononitrate 120 mg Tablet Sustained Release 24 hr  Commonly known as: IMDUR   120 mg, Oral, EVERY MORNING  Qty: 30 Tablet  Refills: 0     labetaloL 300 mg Tablet  Commonly known as: NORMODYNE   300 mg, Oral, 2 TIMES DAILY  Refills: 0     losartan 100 mg Tablet  Commonly known as: COZAAR   100 mg, Oral, NIGHTLY, Take one tablet everyday at bedtime  Refills: 0     Methylprednisolone 4 mg Tablets, Dose Pack  Commonly known as: MEDROL DOSEPACK   Take as instructed.  Qty: 21 Tablet  Refills: 0     montelukast 10 mg Tablet  Commonly known as: SINGULAIR   10 mg, Oral, EVERY EVENING  Refills: 0     nitroGLYCERIN 0.4 mg Tablet, Sublingual  Commonly known as: NITROSTAT   0.4 mg, Sublingual, EVERY 5 MIN PRN, for 3 doses over 15 minutes  Qty: 3 Tablet  Refills: 0      oxyCODONE-acetaminophen 5-325 mg Tablet  Commonly known as: PERCOCET   1 Tablet, Oral, EVERY 6 HOURS PRN  Refills: 0     pantoprazole 40 mg Tablet, Delayed Release (E.C.)  Commonly known as: PROTONIX  40 mg, Oral, DAILY  Refills: 0     rosuvastatin 40 mg Tablet  Commonly known as: CRESTOR   40 mg, Oral, EVERY EVENING, Take one tablet every day  Refills: 0     spironolactone 25 mg Tablet  Commonly known as: ALDACTONE   50 mg, Oral, 2 TIMES DAILY, Patient takes 2 tablets of 25 mg twice a day  Refills: 0     traMADoL 50 mg Tablet  Commonly known as: ULTRAM   50 mg, Oral, EVERY 6 HOURS PRN  Qty: 20 Tablet  Refills: 0            ASK your doctor about these medications.        Details   doxycycline 100 mg Tablet  Ask about: Should I take this medication?   100 mg, Oral, 2 TIMES DAILY  Qty: 10 Tablet  Refills: 0     lactulose 10 gram/15 mL Solution  Commonly known as: ENULOSE  Ask about: Should I take this medication?   30 mL, Oral, DAILY  Qty: 90 mL  Refills: 0

## 2021-11-13 NOTE — ED Nurses Note (Signed)
PT transported to dialysis with a portable telemetry monitor in use. Care endorsed to dialysis nurse. Respiratory at bedside to set up patients BiPAP

## 2021-11-13 NOTE — ED Attending Handoff Note (Signed)
[facility]  Emergency Department  Course Note    Care/report received from Dr.Oteng at [facility]; call received 5:30 a.m.Marland Kitchen  Patient arrived at [facility] emergency room at 6:45 a.m.Marland Kitchen    Per report:  [name] is a 46 y.o. female who had concerns including Shortness of Breath and Chest Pain .  Patient has a dialysis patient.  She does have shortness of breath after receiving 2 units of packed red blood cells secondary to anemia on the evening September 18th into the morning of September 19th.  She does have orthopnea.  Bilateral basilar crackles.  She does have a history of CHF is a dialysis patient normally on Monday Wednesdays and Fridays.  She did go to dialysis on Monday morning.  She developed chest pain/shortness of breath Monday afternoon and went to the emergency room Monday evening.  Patient was noted to have a hemoglobin of 6.2 at that time.  She was given 2 units of packed red blood cells.  Patient states after leaving in the morning of September 19th she developed shortness of breath with orthopnea.  Patient was evaluated at [facility] emergency room.  No beds were available to place patient on floo at [facility] r so patient was sent ER to ER from [facility].  Patient is currently requiring BiPAP to help with her oxygen discomfort.  She is currently on 15/8 at 40% with a backup rate of 18.  Patient's nephrologist is Dr. Liliane Shi.    Pending labs/imaging/consults:  I did speak with Dr. Liliane Shi; he will be coming in to see patient.  Plan:  Dr. Liliane Shi will be coming in to see patient.  He does have [facility] on admitting patient for dialysis.  She was sitting up currently on BiPAP.  She was doing well as long as she does not lay back.  Patient was signed out to Dr. Yolande Jolly at 7:30 a.m.  ED Course as of 11/13/21 0714   Wed Nov 13, 2021   0713 I did speak with Dr. Liliane Shi.  He will be coming into evaluate patient for dialysis.     Patient will be admitted to the  service for  further workup and management.    Disposition: Admitted    Clinical Impression   Hypervolemia, unspecified hypervolemia type (Primary)   Hemodialysis patient (CMS [place])         Regina Eck, DO  11/13/2021, 06:59

## 2021-11-13 NOTE — ED Nurses Note (Signed)
Report called to [facility]. PT will not return to ER from dialysis. Dialysis notified of patients bed placement.

## 2021-11-13 NOTE — ED Nurses Note (Signed)
Pt states that her pain is still a 8/10. Provider notified.

## 2021-11-13 NOTE — ED Triage Notes (Signed)
EMS reports CP/SOb after receiving blood this AM . Hx of COP , chronically on 2lpm. ESRD on dialysis. O2 sat 84 on 6lpm NC per EMS . 324 mg ASA given en route

## 2021-11-13 NOTE — ED Nurses Note (Addendum)
Pt arrived via EMS with chest pain and shortness of breath that has been going on "all day." EMS states that they gave pt aspirin enroute to the hospital. Pt states that she was in the ER last night and received blood and states that her chest has hurt and she has been short of breath ever since. Pt rates her pain at a 10/10. States that pain is mid-sternal that radiates to her back and down her left arm. Pt states that she wears oxygen at home at 2 liters a minute via nasal cannula. EMS brought pt in on 6 liters and pt saturation was 84%. Pt is alert and able to answer questions at this time. She states that she was supposed to have a dialysis treatment today, but states that they did not have a chair for her today so she did not go. Pt lungs diminished in bilateral lower lobes with crackles bilaterally.

## 2021-11-13 NOTE — H&P (Signed)
[facility]    HOSPITALIST H&P    [name] 46 y.o. female ED03/ED03   Date of Service: 11/13/2021    Date of Admission:  11/13/2021   PCP: Cam Hai, FNP Code Status:Prior       Chief Complaint:  Shortness a breath    HPI:  46 year old female past medical history of COPD coronary artery disease who is status post PCI with stent placement on August 15th who has end-stage renal disease on hemodialysis Monday Wednesday Friday coming in with shortness a breath.  Patient was seen in [facility] ER where she was noted to have anemia with hemoglobin up to 6.8 following which was transfused with 1 unit of packed red blood cells and ended up going into respiratory distress likely due to fluid overload.  She required BiPAP and was transferred to the ER here for further evaluation.  Patient has required emergent hemodialysis upon arrival.  Nephrology service is aware.  She is current getting hemodialysis she is awake alert able to answer questions.    At baseline she is on 2 L of oxygen by nasal cannula continuously.  Of note as per records from [facility] patient recently had PCI in August 15th but was not a candidate for CABG due to high-risk for CABG surgery.  Currently she will continues with medical management on Eliquis Plavix as well as Imdur.  Patient's blood pressure remains uncontrolled likely due to noncompliance with recurrent episodes of hypertensive urgency.      ED medications:   Medications Administered in the ED   aspirin chewable tablet 324 mg (0 mg Oral Not Given 11/13/21 0328)   nitroGLYCERIN (NITROSTAT) sublingual tablet (0.4 mg Sublingual Given 11/13/21 0355)   furosemide (LASIX) 10 mg/mL injection (40 mg Intravenous Given 11/13/21 0357)   fentaNYL (SUBLIMAZE) 50 mcg/mL injection (has no administration in time range)         PMHx:    Past Medical History:   Diagnosis Date    Asthma     Chronic diastolic CHF (congestive heart failure) (CMS HCC)     Dependence on  supplemental oxygen     Esophageal reflux     ESRD (end stage renal disease) (CMS HCC)     History of anemia due to CKD     MI (myocardial infarction) (CMS HCC)     Mitral valve regurgitation     Pulmonary edema     Sleep apnea     SVC syndrome     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    Tricuspid valve regurgitation     Uncontrolled hypertension         PSHx:   Past Surgical History:   Procedure Laterality Date    CORONARY ARTERY ANGIOPLASTY      ESOPHAGOGASTRODUODENOSCOPY      HX BACK SURGERY      HX CHOLECYSTECTOMY      HX FOOT SURGERY Right     HX HYSTERECTOMY      HX TONSILLECTOMY            Allergies:    Allergies   Allergen Reactions    Lisinopril Swelling     Tongue and throat swelling    Cardene [Nicardipine]  Other Adverse Reaction (Add comment)     Chest pain     Oxycodone Itching    Social History  Social History     Tobacco Use    Smoking status: Every Day  Packs/day: 1.00     Years: 10.00     Pack years: 10.00     Types: Cigarettes    Smokeless tobacco: Former    Tobacco comments:     "Haven't had a cigerette in 2 weeks."   Vaping Use    Vaping Use: Never used   Substance Use Topics    Alcohol use: Not Currently    Drug use: Yes     Types: Marijuana       Family History  Family Medical History:       Problem Relation (Age of Onset)    Breast Cancer Mother    Coronary Artery Disease Mother    Diabetes type II Mother    Hypertension (High Blood Pressure) Father               Home Meds:      Prior to Admission medications    Medication Sig Start Date End Date Taking? Authorizing Provider   acetaminophen (TYLENOL) 325 mg Oral Tablet Take 2 Tablets (650 mg total) by mouth Every 4 hours as needed 08/19/21   Scudiere, Loa Socks, APRN   amLODIPine (NORVASC) 10 mg Oral Tablet Take 1 Tablet (10 mg total) by mouth Once a day Take one tablet every day    Provider, Historical   apixaban (ELIQUIS) 2.5 mg Oral Tablet Take 1 Tablet (2.5 mg total) by mouth Twice daily for 30  days 10/21/21 11/20/21  Carron Brazen, DO   budesonide-formoteroL [facility]) 160-4.5 mcg/actuation Inhalation oral inhaler Take 2 Puffs by inhalation Twice daily    Provider, Historical   calcitrioL (ROCALTROL) 0.5 mcg Oral Capsule Take 2 Capsules (1 mcg total) by mouth Every Monday, Wednesday and Friday    Provider, Historical   calcium acetate,phosphat bind, (PHOSLO) 667 mg Oral Capsule Take 2 Capsules (1,334 mg total) by mouth Three times daily with meals for 30 days 10/21/21 11/20/21  Carron Brazen, DO   cinacalcet (SENSIPAR) 30 mg Oral Tablet Take 1 Tablet (30 mg total) by mouth Every evening    Provider, Historical   cloNIDine (CATAPRES-TTS) 0.3 mg/24 hr Transdermal Patch Weekly Place 1 Patch (0.3 mg total) on the skin Every 7 days 08/02/21   Provider, Historical   cloNIDine HCL (CATAPRES) 0.2 mg Oral Tablet Take 1 Tablet (0.2 mg total) by mouth Twice daily Take one tablet twice a day    Provider, Historical   clopidogreL (PLAVIX) 75 mg Oral Tablet Take 1 Tablet (75 mg total) by mouth Once a day    Provider, Historical   cyclobenzaprine (FLEXERIL) 5 mg Oral Tablet Take 1 Tablet (5 mg total) by mouth Twice per day as needed for Muscle spasms    Provider, Historical   doxazosin (CARDURA) 4 mg Oral Tablet Take 1 Tablet (4 mg total) by mouth Every evening    Provider, Historical   epoetin alfa-epbx (RETACRIT) 10,000 unit/mL Injection Solution Infuse 1 mL (10,000 Units total) into a venous catheter Give in Dialysis for 30 days 10/21/21 11/20/21  Carron Brazen, DO   famotidine (PEPCID) 40 mg Oral Tablet Take 1 Tablet (40 mg total) by mouth Once a day Take one tablet once a day    Provider, Historical   furosemide (LASIX) 80 mg Oral Tablet Take 2 Tablets (160 mg total) by mouth Once a day Take after dialysis on dialysis days; otherwise take in morning daily.    Provider, Historical   hydrALAZINE (APRESOLINE) 100 mg Oral Tablet Take 1 Tablet (100 mg total) by mouth Every 8 hours  for 30 days 10/21/21 11/20/21  Carron Brazen, DO   ipratropium-albuterol 0.5 mg-3 mg(2.5 mg base)/3 mL Solution for Nebulization Take 3 mL by nebulization Four times a day as needed 10/03/21   Sherol Dade, PA-C   isosorbide mononitrate (IMDUR) 120 mg Oral Tablet Sustained Release 24 hr Take 1 Tablet (120 mg total) by mouth Every morning for 30 days 10/22/21 11/21/21  Carron Brazen, DO   labetaloL (NORMODYNE) 300 mg Oral Tablet Take 1 Tablet (300 mg total) by mouth Twice daily 10/03/21   Sherol Dade, PA-C   losartan (COZAAR) 100 mg Oral Tablet Take 1 Tablet (100 mg total) by mouth Every night Take one tablet everyday at bedtime    Provider, Historical   montelukast (SINGULAIR) 10 mg Oral Tablet Take 1 Tablet (10 mg total) by mouth Every evening    Provider, Historical   nitroGLYCERIN (NITROSTAT) 0.4 mg Sublingual Tablet, Sublingual Place 1 Tablet (0.4 mg total) under the tongue Every 5 minutes as needed for Chest pain for up to 3 doses for 3 doses over 15 minutes 10/25/21   Bretta Bang, DO   pantoprazole (PROTONIX) 40 mg Oral Tablet, Delayed Release (E.C.) Take 1 Tablet (40 mg total) by mouth Once a day    Provider, Historical   rosuvastatin (CRESTOR) 40 mg Oral Tablet Take 1 Tablet (40 mg total) by mouth Every evening Take one tablet every day    Provider, Historical   spironolactone (ALDACTONE) 25 mg Oral Tablet Take 2 Tablets (50 mg total) by mouth Twice daily Patient takes 2 tablets of 25 mg twice a day    Provider, Historical   traMADoL (ULTRAM) 50 mg Oral Tablet Take 1 Tablet (50 mg total) by mouth Every 6 hours as needed for Pain 11/09/21   Ammie Ferrier, MD   apixaban (ELIQUIS) 2.5 mg Oral Tablet Take 1 Tablet (2.5 mg total) by mouth Twice daily for 41 doses 08/31/21 10/21/21  Julian Hy, MD   aspirin 81 mg Oral Tablet, Chewable Chew 1 Tablet (81 mg total) Once a day Take one tablet every day for 30 days  10/21/21  Provider, Historical   cyanocobalamin (VITAMIN B 12) 1,000 mcg Oral Tablet Take 1 Tablet (1,000 mcg total) by  mouth Once a day with lunch  Patient not taking: Reported on 10/15/2021 10/03/21 11/13/21  Sherol Dade, PA-C   dilTIAZem (CARDIZEM CD) 180 mg Oral Capsule, Sust. Release 24 hr Take 1 Capsule (180 mg total) by mouth Once a day for 30 days  Patient not taking: Reported on 10/15/2021 08/19/21 10/21/21  Scudiere, Loa Socks, APRN   diphenhydrAMINE (BENADRYL) 50 mg Oral Capsule Take 1 Capsule (50 mg total) by mouth Every 6 hours as needed for Itching  Patient taking differently: Take 25 mg by mouth Every 6 hours as needed for Itching 10/03/21 11/13/21  Sherol Dade, PA-C   doxycycline 100 mg Oral Tablet Take 1 Tablet (100 mg total) by mouth Twice daily for 5 days 10/21/21 11/13/21  Carron Brazen, DO   epoetin alfa-epbx (RETACRIT) 10,000 unit/mL Injection Solution Infuse 1 mL (10,000 Units total) into a venous catheter Give in Dialysis for 5 days  Patient not taking: Reported on 10/15/2021 10/03/21 10/21/21  Sherol Dade, PA-C   folic acid (FOLVITE) 1 mg Oral Tablet Take 1 Tablet (1 mg total) by mouth Once a day  Patient not taking: Reported on 10/15/2021 10/03/21 10/21/21  Sherol Dade, PA-C   guaiFENesin (MUCINEX) 600 mg Oral Tablet Extended  Release 12hr Take 1 Tablet (600 mg total) by mouth Every 12 hours  Patient not taking: Reported on 10/15/2021  10/21/21  Provider, Historical   hydrALAZINE (APRESOLINE) 100 mg Oral Tablet Take 1 Tablet (100 mg total) by mouth Twice daily  10/21/21  Provider, Historical   HYDROmorphone (DILAUDID) 2 mg/mL Injection Syringe Infuse 0.2 mL (0.4 mg total) into a venous catheter Every 4 hours as needed 10/03/21 10/21/21  Sherol Dade, PA-C   iron sucrose (VENOFER) 100 mg iron/5 mL Intravenous Solution Infuse 5 mL (100 mg total) into a venous catheter Give in Dialysis for 5 days  Patient not taking: Reported on 10/15/2021 10/03/21 10/21/21  Sherol Dade, PA-C   isosorbide mononitrate (IMDUR) 120 mg Oral Tablet Sustained Release 24 hr Take 1 Tablet (120 mg total) by mouth Every morning for  30 days 08/20/21 10/21/21  Scudiere, Loa Socks, APRN   labetaloL (TRANDATE) 5 mg/mL Intravenous Solution Infuse 2 mL (10 mg total) into a venous catheter Every one hour as needed for up to 1 day 10/03/21 10/21/21  Sherol Dade, PA-C   lactulose (ENULOSE) 10 gram/15 mL Oral Solution Take 30 mL by mouth Once a day for 3 days 10/22/21 11/13/21  Winfred Burn, MD   loratadine (CLARITIN) 10 mg Oral Tablet Take 1 Tablet (10 mg total) by mouth Once a day  Patient not taking: Reported on 10/15/2021  10/21/21  Provider, Historical   Methylprednisolone (MEDROL DOSEPACK) 4 mg Oral Tablets, Dose Pack Take as instructed. 10/21/21 11/13/21  Reyes, Razelle, DO   nitroGLYCERIN in 5 % dextrose 50 mg/250 mL (200 mcg/mL) Intravenous Solution Infuse 5 mcg/min into a venous catheter Cath Lab Continuous  Patient not taking: Reported on 10/15/2021 10/03/21 10/21/21  Sherol Dade, PA-C   ondansetron [facility]) 2 mg/mL Injection Solution Infuse 2 mL (4 mg total) into a venous catheter Every 8 hours as needed 10/03/21 10/21/21  Sherol Dade, PA-C   oxyCODONE-acetaminophen (PERCOCET) 5-325 mg Oral Tablet Take 1 Tablet by mouth Every 6 hours as needed 10/03/21 11/13/21  Sherol Dade, PA-C          ROS:   General: No fever or chills. No weight changes, fatigue, weakness.   HEENT: No headaches, dizziness, changes in vision, changes in hearing, or difficulty swallowing.    Skin:  No rashes, erythema or bruises.   Cardiac: No chest pain, palpitations, or arrhythmia.    Respiratory: Shortness of breath, cough, or wheezing.  GI: No nausea or vomiting. No abdominal pain.   Urinary: No dysuria, hematuria, or change in frequency.    Vascular: No edema.     Musculoskeletal: No muscle weakness, pain, or decreased range of motion.   Neurologic: No loss of sensation, numbness or tingling.   Endocrine: No heat or cold intolerance or polydipsia.   Psychiatric: No insomnia, depression or anxiety.      Results for orders placed or performed during the hospital  encounter of 11/13/21 (from the past 24 hour(s))   ECG 12 LEAD   Result Value Ref Range    Ventricular rate 104 BPM    Atrial Rate 104 BPM    PR Interval 194 ms    QRS Duration 104 ms    QT Interval 352 ms    QTC Calculation 462 ms    Calculated P Axis 63 degrees    Calculated R Axis 14 degrees    Calculated T Axis 80 degrees   COMPREHENSIVE METABOLIC PANEL, NON-FASTING   Result Value  Ref Range    SODIUM 134 (L) 136 - 145 mmol/L    POTASSIUM 5.1 3.5 - 5.1 mmol/L    CHLORIDE 95 (L) 98 - 107 mmol/L    CO2 TOTAL 28 21 - 32 mmol/L    ANION GAP 11 4 - 13 mmol/L    BUN 44 (H) 7 - 18 mg/dL    CREATININE 9.84 (H) 0.55 - 1.02 mg/dL    BUN/CREA RATIO 4     ESTIMATED GFR 5 (L) >59 mL/min/1.79m2    ALBUMIN 3.3 (L) 3.4 - 5.0 g/dL    CALCIUM 9.4 8.5 - 10.1 mg/dL    GLUCOSE 81 74 - 106 mg/dL    ALKALINE PHOSPHATASE 89 46 - 116 U/L    ALT (SGPT) 9 <=78 U/L    AST (SGOT) 31 15 - 37 U/L    BILIRUBIN TOTAL 0.8 0.2 - 1.0 mg/dL    PROTEIN TOTAL 8.1 6.4 - 8.2 g/dL    ALBUMIN/GLOBULIN RATIO 0.7 (L) 0.8 - 1.4    OSMOLALITY, CALCULATED 279 270 - 290 mOsm/kg    CALCIUM, CORRECTED 10.1 mg/dL    GLOBULIN 4.8    TROPONIN-I NOW   Result Value Ref Range    TROPONIN I 81 (H) <15 ng/L   CBC WITH DIFF   Result Value Ref Range    WBC 8.0 4.0 - 10.5 x10^3/uL    RBC 2.88 (L) 4.20 - 5.40 x10^6/uL    HGB 8.6 (L) 12.5 - 16.0 g/dL    HCT 26.3 (L) 37.0 - 47.0 %    MCV 91.2 78.0 - 99.0 fL    MCH 29.9 27.0 - 32.0 pg    MCHC 32.8 32.0 - 36.0 g/dL    RDW 18.9 (H) 11.6 - 14.8 %    PLATELETS 177 140 - 440 x10^3/uL    MPV 9.2 7.4 - 10.4 fL    NEUTROPHIL % 80 (H) 40 - 76 %    LYMPHOCYTE % 8 (L) 25 - 45 %    MONOCYTE % 9 0 - 12 %    EOSINOPHIL % 2 0 - 7 %    BASOPHIL % 0 0 - 3 %    NEUTROPHIL # 6.40 1.80 - 8.40 x10^3/uL    LYMPHOCYTE # 0.64 (L) 1.10 - 5.00 x10^3/uL    MONOCYTE # 0.69 0.00 - 1.30 x10^3/uL    EOSINOPHIL # 0.20 0.00 - 0.80 x10^3/uL    BASOPHIL # 0.03 0.00 - 0.30 x10^3/uL   COVID-19, FLU A/B, RSV RAPID BY PCR   Result Value Ref Range    SARS-CoV-2 Not  Detected Not Detected    INFLUENZA VIRUS TYPE A Not Detected Not Detected    INFLUENZA VIRUS TYPE B Not Detected Not Detected    RESPIRATORY SYNCTIAL VIRUS (RSV) Not Detected Not Detected   ARTERIAL BLOOD GAS/LACTATE   Result Value Ref Range    PH (ARTERIAL) 7.35 7.35 - 7.45    PCO2 (ARTERIAL) 48 (H) 35 - 45 mm/Hg    BICARBONATE (ARTERIAL) 26.5 (H) 20.0 - 26.0 mmol/L    BASE EXCESS (ARTERIAL) 0.6 -2.0 - 2.0 mmol/L    MET-HEMOGLOBIN 0.5 <=2.0 %    LACTATE 0.3 <=2.0 mmol/L    CARBOXYHEMOGLOBIN 3.8 (H) <=1.5 %    O2CT 10.2 %    %FIO2 (ARTERIAL) 40 %    PO2 (ARTERIAL) 69 (L) 80 - 100 mm/Hg    OXYHEMOGLOBIN 89.5 88.0 - 100.0 %    ALLEN TEST YES     DRAW SITE  RBRACH    TROPONIN-I IN ONE HOUR   Result Value Ref Range    TROPONIN I 83 (H) <15 ng/L   B-TYPE NATRIURETIC PEPTIDE   Result Value Ref Range    BNP >35,000 pg/mL          Physical:  Filed Vitals:    11/13/21 0430 11/13/21 0445 11/13/21 0515 11/13/21 0640   BP: (!) 181/126 (!) 192/122 (!) 182/118    Pulse: 89 88 82    Resp: (!) _0 Temp:       SpO2: 100% 99% 99% 100%      General: Patient is alert and oriented to person, place, and time. No acute distress. Communicates appropriately.   Head: Normocephalic and atraumatic.    Eyes: Pupils equally round and react to light and accommodate. Extraocular movements intact.  Conjunctiva normal. Sclerae are normal.    Nose: Nasal passages clear. Mucosa moist.  BiPAP mask in place.  Throat: Moist oral mucosa. No erythema or exudate of the pharynx. Clear oropharynx.    Neck: Supple. No cervical lymphadenopathy or supraclavicular nodes detected. Trachea midline   Heart: Regular rate and rhythm. S1 & S2 present. No S3 or S4. No rubs, gallops, or murmurs appreciated.  Radial and dorsalis pedis pulses +2/4 bilaterally.  Brisk capillary refill.    Lungs: Clear to auscultation bilaterally with no wheezes or rales. Equal chest excursion.  No conversational dyspnea. No respiratory distress noted.   Abdomen: Soft, nontender,  nondistended belly. Bowel sounds are present in all four quadrants. No rigidity.  No guarding.  No ascites.   Extremities: No edema, cyanosis, or clubbing. Grossly moves all extremities.  PermCath right infraclavicular area.    Skin: Warm and dry without lesions. No ecchymosis noted.    Neurologic: Cranial nerves II through XII are grossly intact. Sensation to light touch is intact. Strength 5/5 in upper extremities and lower extremities bilaterally.    Genitourinary:  No urinary incontinence or Foley catheter   Psychiatric: Judgment and insight are intact. Mood and affect are appropriate for the situation.       Diagnostic studies:  XR CHEST AP    Result Date: 11/11/2021  Impression Pulmonary edema Radiologist location ID: [id number]         EKG interpretation:     Sinus tachycardia with nonspecific ST and T-wave abnormalities    Assessments:  Active Hospital Problems   (*Primary Problem)    Diagnosis    *Fluid overload    Hypertensive urgency    Acute respiratory failure with hypoxia (CMS HCC)    Anemia in chronic kidney disease    Tobacco dependence    End-stage renal disease on hemodialysis (CMS HCC)         Plan:    46 year old female with multiple medical problems including hypertensive urgency chronic hypoxic respiratory failure end-stage renal disease on hemodialysis tobacco dependence with COPD with oxygen dependent up to 2 L at home by nasal cannula coming in with shortness a breath following a blood transfusion.    -Acute on chronic hypoxic respiratory failure likely due to fluid overload  This followed blood transfusion.  Patient getting emergent hemodialysis.  Nephrology service was consulted follow-up and evaluation appreciated    -Hypertensive urgency  Continue with home medications to adjust as blood pressure responds.    -Coronary artery disease  Status post PCI 8/15  Continue with aspirin Plavix, ACE inhibitor/ARB, Imdur with her other home medications    -End-stage renal disease  on  hemodialysis  Patient getting hemodialysis Monday Wednesday Friday  Nephrology evaluation appreciated    -Anemia due to chronic disease  Status post transfusion with packed red blood cells  We will continue monitor H/H    -Tobacco dependence  Smoking cessation discussed  Nicotine patch    -History of COPD  Continue bronchodilators and oxygen supplementation    Code status:  Full code  DVT prophylaxis: Eliquis  Diet:  Renal and heart healthy diet    Disposition:  The patient is currently acutely ill requiring treatment on the medical floor for acute hypoxic respiratory failure due to fluid overload. Patient will be closely evaluated monitor and remove be adjusted accordingly.  Estimated length of stay greater than 48 hr to obtain full medical treatment.      Norman Herrlich, MD    [place] HOSPITALIST

## 2021-11-13 NOTE — ED Nurses Note (Signed)
Called report to [facility] and spoke with Sonia Baller. [facility] in room to transport patient to [facility]. Pt remains on BiPAP at settings set by physician and respiratory. Pt stable at transfer. Pt leaving with belongings and paperwork.

## 2021-11-13 NOTE — Consults (Signed)
[facility]    NEPHROLOGY CONSULTATION NOTE       Rikki Confer 46 y.o. female 328/A   Date of Service: 11/13/2021    Date of Admission:  11/13/2021   PCP: Cam Hai, FNP Code Status:Prior       Reason for Consultation:  End-stage renal disease on hemodialysis.  Fluid overload     HPI:   46 year old female with past medical history of end-stage renal disease on hemodialysis was found anemic went to the emergency room in another facility received the blood she developed worsening shortness of breath was sent to our facility on BiPAP to arrange for dialysis.  Patient was complaining of shortness of breath at rest with orthopnea patient was not feeling well denies productive cough fevers or chills.    I have arranged for stat dialysis for her.  I re-evaluate the patient her symptoms improved she does not require BiPAP anymore.      ROS:   Systematic review of 12 organ systems was negative except what mentioned in in the HPI.    ED medications:   Medications Administered in the ED   aspirin chewable tablet 324 mg (0 mg Oral Not Given 11/13/21 0328)   nitroGLYCERIN (NITROSTAT) sublingual tablet (0.4 mg Sublingual Given 11/13/21 0355)   furosemide (LASIX) 10 mg/mL injection (40 mg Intravenous Given 11/13/21 0357)   fentaNYL (SUBLIMAZE) 50 mcg/mL injection (has no administration in time range)       PMHx:    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    Tricuspid valve regurgitation     Uncontrolled hypertension         PSHx:   Past Surgical History:   Procedure Laterality Date     CORONARY ARTERY ANGIOPLASTY      ESOPHAGOGASTRODUODENOSCOPY      HX BACK SURGERY      HX CHOLECYSTECTOMY      HX FOOT SURGERY Right     HX HYSTERECTOMY      HX TONSILLECTOMY            Allergies:    Allergies   Allergen Reactions    Lisinopril Swelling     Tongue and throat swelling    Cardene [Nicardipine]  Other Adverse Reaction (Add comment)     Chest pain     Oxycodone Itching    Social History  Social History     Tobacco Use    Smoking status: Every Day     Packs/day: 1.00     Years: 10.00     Pack years: 10.00     Types: Cigarettes    Smokeless tobacco: Former    Tobacco comments:     "Haven't had a cigerette in 2 weeks."   Vaping Use    Vaping Use: Never used   Substance Use Topics    Alcohol use: Not Currently    Drug use:  Yes     Types: Marijuana       Family History  Family Medical History:       Problem Relation (Age of Onset)    Breast Cancer Mother    Coronary Artery Disease Mother    Diabetes type II Mother    Hypertension (High Blood Pressure) Father               Home Meds:      Prior to Admission medications    Medication Sig Start Date End Date Taking? Authorizing Provider   acetaminophen (TYLENOL) 325 mg Oral Tablet Take 2 Tablets (650 mg total) by mouth Every 4 hours as needed 08/19/21   Scudiere, Loa Socks, APRN   amLODIPine (NORVASC) 10 mg Oral Tablet Take 1 Tablet (10 mg total) by mouth Once a day Take one tablet every day    Provider, Historical   apixaban (ELIQUIS) 2.5 mg Oral Tablet Take 1 Tablet (2.5 mg total) by mouth Twice daily for 30 days 10/21/21 11/20/21  Carron Brazen, DO   budesonide-formoteroL [facility]) 160-4.5 mcg/actuation Inhalation oral inhaler Take 2 Puffs by inhalation Twice daily    Provider, Historical   calcitrioL (ROCALTROL) 0.5 mcg Oral Capsule Take 2 Capsules (1 mcg total) by mouth Every Monday, Wednesday and Friday    Provider, Historical   calcium acetate,phosphat bind, (PHOSLO) 667 mg Oral Capsule Take 2 Capsules (1,334 mg total) by mouth Three times daily with meals  for 30 days 10/21/21 11/20/21  Carron Brazen, DO   cinacalcet (SENSIPAR) 30 mg Oral Tablet Take 1 Tablet (30 mg total) by mouth Every evening    Provider, Historical   cloNIDine (CATAPRES-TTS) 0.3 mg/24 hr Transdermal Patch Weekly Place 1 Patch (0.3 mg total) on the skin Every 7 days 08/02/21   Provider, Historical   cloNIDine HCL (CATAPRES) 0.2 mg Oral Tablet Take 1 Tablet (0.2 mg total) by mouth Twice daily Take one tablet twice a day    Provider, Historical   clopidogreL (PLAVIX) 75 mg Oral Tablet Take 1 Tablet (75 mg total) by mouth Once a day    Provider, Historical   cyanocobalamin (VITAMIN B 12) 1,000 mcg Oral Tablet Take 1 Tablet (1,000 mcg total) by mouth Once a day with lunch  Patient not taking: Reported on 10/15/2021 10/03/21   Sherol Dade, PA-C   cyclobenzaprine (FLEXERIL) 5 mg Oral Tablet Take 1 Tablet (5 mg total) by mouth Twice per day as needed for Muscle spasms    Provider, Historical   doxazosin (CARDURA) 4 mg Oral Tablet Take 1 Tablet (4 mg total) by mouth Every evening    Provider, Historical   epoetin alfa-epbx (RETACRIT) 10,000 unit/mL Injection Solution Infuse 1 mL (10,000 Units total) into a venous catheter Give in Dialysis for 30 days 10/21/21 11/20/21  Carron Brazen, DO   famotidine (PEPCID) 40 mg Oral Tablet Take 1 Tablet (40 mg total) by mouth Once a day Take one tablet once a day    Provider, Historical   furosemide (LASIX) 80 mg Oral Tablet Take 2 Tablets (160 mg total) by mouth Once a day Take after dialysis on dialysis days; otherwise take in morning daily.    Provider, Historical   hydrALAZINE (APRESOLINE) 100 mg Oral Tablet Take 1 Tablet (100 mg total) by mouth Every 8 hours for 30 days 10/21/21 11/20/21  Carron Brazen, DO   ipratropium-albuterol 0.5 mg-3 mg(2.5 mg base)/3 mL Solution for Nebulization Take 3 mL by nebulization Four times a day as needed 10/03/21  Sherol Dade, PA-C   isosorbide mononitrate (IMDUR) 120 mg Oral Tablet Sustained Release 24 hr Take 1 Tablet (120 mg  total) by mouth Every morning for 30 days 10/22/21 11/21/21  Carron Brazen, DO   labetaloL (NORMODYNE) 300 mg Oral Tablet Take 1 Tablet (300 mg total) by mouth Twice daily 10/03/21   Sherol Dade, PA-C   lactulose (ENULOSE) 10 gram/15 mL Oral Solution Take 30 mL by mouth Once a day for 3 days 10/22/21 10/25/21  Winfred Burn, MD   losartan (COZAAR) 100 mg Oral Tablet Take 1 Tablet (100 mg total) by mouth Every night Take one tablet everyday at bedtime    Provider, Historical   montelukast (SINGULAIR) 10 mg Oral Tablet Take 1 Tablet (10 mg total) by mouth Every evening    Provider, Historical   nitroGLYCERIN (NITROSTAT) 0.4 mg Sublingual Tablet, Sublingual Place 1 Tablet (0.4 mg total) under the tongue Every 5 minutes as needed for Chest pain for up to 3 doses for 3 doses over 15 minutes 10/25/21   Bretta Bang, DO   pantoprazole (PROTONIX) 40 mg Oral Tablet, Delayed Release (E.C.) Take 1 Tablet (40 mg total) by mouth Once a day    Provider, Historical   rosuvastatin (CRESTOR) 40 mg Oral Tablet Take 1 Tablet (40 mg total) by mouth Every evening Take one tablet every day    Provider, Historical   spironolactone (ALDACTONE) 25 mg Oral Tablet Take 2 Tablets (50 mg total) by mouth Twice daily Patient takes 2 tablets of 25 mg twice a day    Provider, Historical   traMADoL (ULTRAM) 50 mg Oral Tablet Take 1 Tablet (50 mg total) by mouth Every 6 hours as needed for Pain 11/09/21   Ammie Ferrier, MD   apixaban (ELIQUIS) 2.5 mg Oral Tablet Take 1 Tablet (2.5 mg total) by mouth Twice daily for 41 doses 08/31/21 10/21/21  Julian Hy, MD   aspirin 81 mg Oral Tablet, Chewable Chew 1 Tablet (81 mg total) Once a day Take one tablet every day for 30 days  10/21/21  Provider, Historical   dilTIAZem (CARDIZEM CD) 180 mg Oral Capsule, Sust. Release 24 hr Take 1 Capsule (180 mg total) by mouth Once a day for 30 days  Patient not taking: Reported on 10/15/2021 08/19/21 10/21/21  Scudiere, Loa Socks, APRN   diphenhydrAMINE (BENADRYL)  50 mg Oral Capsule Take 1 Capsule (50 mg total) by mouth Every 6 hours as needed for Itching  Patient taking differently: Take 25 mg by mouth Every 6 hours as needed for Itching 10/03/21 11/13/21  Sherol Dade, PA-C   doxycycline 100 mg Oral Tablet Take 1 Tablet (100 mg total) by mouth Twice daily for 5 days 10/21/21 11/13/21  Carron Brazen, DO   epoetin alfa-epbx (RETACRIT) 10,000 unit/mL Injection Solution Infuse 1 mL (10,000 Units total) into a venous catheter Give in Dialysis for 5 days  Patient not taking: Reported on 10/15/2021 10/03/21 10/21/21  Sherol Dade, PA-C   folic acid (FOLVITE) 1 mg Oral Tablet Take 1 Tablet (1 mg total) by mouth Once a day  Patient not taking: Reported on 10/15/2021 10/03/21 10/21/21  Sherol Dade, PA-C   guaiFENesin [facility]) 600 mg Oral Tablet Extended Release 12hr Take 1 Tablet (600 mg total) by mouth Every 12 hours  Patient not taking: Reported on 10/15/2021  10/21/21  Provider, Historical   hydrALAZINE (APRESOLINE) 100 mg Oral Tablet Take 1 Tablet (100 mg total) by mouth Twice daily  10/21/21  Provider, Historical   HYDROmorphone (DILAUDID) 2 mg/mL Injection Syringe Infuse 0.2 mL (0.4 mg total) into a venous catheter Every 4 hours as needed 10/03/21 10/21/21  Sherol Dade, PA-C   iron sucrose (VENOFER) 100 mg iron/5 mL Intravenous Solution Infuse 5 mL (100 mg total) into a venous catheter Give in Dialysis for 5 days  Patient not taking: Reported on 10/15/2021 10/03/21 10/21/21  Sherol Dade, PA-C   isosorbide mononitrate (IMDUR) 120 mg Oral Tablet Sustained Release 24 hr Take 1 Tablet (120 mg total) by mouth Every morning for 30 days 08/20/21 10/21/21  Scudiere, Loa Socks, APRN   labetaloL (TRANDATE) 5 mg/mL Intravenous Solution Infuse 2 mL (10 mg total) into a venous catheter Every one hour as needed for up to 1 day 10/03/21 10/21/21  Sherol Dade, PA-C   loratadine (CLARITIN) 10 mg Oral Tablet Take 1 Tablet (10 mg total) by mouth Once a day  Patient not taking: Reported on  10/15/2021  10/21/21  Provider, Historical   Methylprednisolone (MEDROL DOSEPACK) 4 mg Oral Tablets, Dose Pack Take as instructed. 10/21/21 11/13/21  Reyes, Razelle, DO   nitroGLYCERIN in 5 % dextrose 50 mg/250 mL (200 mcg/mL) Intravenous Solution Infuse 5 mcg/min into a venous catheter Cath Lab Continuous  Patient not taking: Reported on 10/15/2021 10/03/21 10/21/21  Sherol Dade, PA-C   ondansetron [facility]) 2 mg/mL Injection Solution Infuse 2 mL (4 mg total) into a venous catheter Every 8 hours as needed 10/03/21 10/21/21  Sherol Dade, PA-C   oxyCODONE-acetaminophen (PERCOCET) 5-325 mg Oral Tablet Take 1 Tablet by mouth Every 6 hours as needed 10/03/21 11/13/21  Sherol Dade, PA-C          Current medications   aspirin chewable tablet 324 mg, 324 mg, Oral, Now  midodrine (PROAMITINE) tablet, 5 mg, Oral, DIALYSIS DAILY PRN  nitroGLYCERIN (NITROSTAT) sublingual tablet, 0.4 mg, Sublingual, Q5 Min            Physical:  Filed Vitals:    11/13/21 0745 11/13/21 0815 11/13/21 0830 11/13/21 0854   BP: (!) 171/108 (!) 178/133 (!) 172/106    Pulse: 77 94 83    Resp: 15 (!) 22 15    Temp:    36.7 C (98.1 F)   SpO2: 100% 100% 100%       No intake or output data in the 24 hours ending 11/13/21 1437     Patient is moderate distress secondary to shortness of breath.    HEENT normocephalic atraumatic.  Eye exam normal inspection.    Mucous membranes moist, no jaundice.  Neck exam no JVD normal inspection.  Cardiovascular system: Regular rate and rhythm no murmurs rubs or gallops. No chest wall tenderness  Lungs:  Fine crackles bilaterally.  Abdomen soft nontender nondistended.  Extremities no clubbing cyanosis or edema   Neuro exam: EOMI, normal speech  Right chest PermCath.    Labs:  CBC:     8.0 (09/20 0348) \   8.6* (09/20 7858) /   177 (09/20 0348)      / 26.3* (09/20 0348) \          BMP:   134* (09/20 0348) 95* (09/20 0348) 44* (09/20 0348)    /     81 (09/20 0348)   5.1 (09/20 0348) 28 (09/20 0348) 9.84* (09/20 0348)  \                 Diagnostic studies:  Imaging:   XR AP  MOBILE CHEST  Narrative: Livy Piekarski    RADIOLOGIST: Alfredia Ferguson, MD    XR AP MOBILE CHEST performed on 11/13/2021 4:16 AM    CLINICAL HISTORY: Chest Pain.  chest pain    TECHNIQUE: Frontal view of the chest.    COMPARISON:  11/12/2021.    FINDINGS:  Right IJ catheter stable in position   Cardiac and mediastinal contours are stable.   Persistent diffuse bilateral airspace disease, not significantly changed.   No pneumothorax.  Impression: NO SIGNIFICANT CHANGE SINCE THE PRIOR EXAM.    Radiologist location ID: [id number]  ECG 12 LEAD  Sinus tachycardia  Minimal voltage criteria for LVH, may be normal variant ( [employer] product )  Nonspecific ST and T wave abnormality  Abnormal ECG  When compared with ECG of 11-Nov-2021 21:39,  No significant change was found      Assessments:  Active Hospital Problems   (*Primary Problem)    Diagnosis    *Fluid overload    Hypertensive urgency    Acute respiratory failure with hypoxia (CMS HCC)    Anemia in chronic kidney disease    Tobacco dependence    End-stage renal disease on hemodialysis (CMS HCC)       Plan:       End-stage renal disease on hemodialysis  -Continue dialysis 3 times a week on MWF.  Arrange for stat dialysis today due to fluid overload.  -please refer to dialysis orders    Anemia  -Monitor  -Epogen    CKD MBD  -Phosphorus, take binders.  -Renal diet    Acid-base  -Stable  -Continue dialysis    Electrolytes  -Monitor  -See dialysis orders  -Replace as needed    Volume status/fluid overload  -start dialysis today  -Fluid restriction  -Dialysis    Diet  -Renal diet      After dialysis, from Nephrology standpoint, patient is okay for discharge.        MY ORDERS LAST 24 (24h ago, onward)       Start     Ordered    11/13/21 0745  HEMODIALYSIS  Every Mon,Wed, Fri (0800)      Comments: Heparin:  0   UF Goal 3.0- 4.0 L  Contact me for any questions    11/13/21 0738    11/13/21 0745  [facility] PATIENT PRE AND POST  DIALYSIS TREATMENT  EVERY OTHER DAY         11/13/21 0738    11/13/21 0745  MISCELLANEOUS MD/DO TO NURSE  UNTIL DISCONTINUED        Comments: Please obtain consent for HD and call the dialysis suite [phone] for STAT dialysis    11/13/21 0739    11/13/21 0737  midodrine (PROAMITINE) tablet  DIALYSIS DAILY PRN        Note to Pharmacy: Administer at start of HD.  May repeat the dose after 30 min from start of HD    11/13/21 [place], MD, FASN, 11/13/2021, 14:37

## 2021-11-13 NOTE — ED Nurses Note (Signed)
Pt states that her pain has gone down to a 7/10. Pt tolerating BiPap well. States that she does not feel like she is fighting to breath.

## 2021-11-13 NOTE — Care Plan (Signed)
Pt O2 SAT remains >90 on RA. Pt c/o back pain (chronic) and desire to change diet.  No s/s of distress noted.      Problem: Adult Inpatient Plan of Care  Goal: Plan of Care Review  Outcome: Ongoing (see interventions/notes)  Goal: Patient-Specific Goal (Individualized)  Outcome: Ongoing (see interventions/notes)  Flowsheets (Taken 11/13/2021 1400)  Individualized Care Needs: O2 mgnt, breathing treatments  Anxieties, Fears or Concerns: Pt concerned about chest pain  Patient-Specific Goals (Include Timeframe): To remove fluid and go home  Goal: Absence of Hospital-Acquired Illness or Injury  Outcome: Ongoing (see interventions/notes)  Goal: Optimal Comfort and Wellbeing  Outcome: Ongoing (see interventions/notes)  Goal: Rounds/Family Conference  Outcome: Ongoing (see interventions/notes)     Problem: Gas Exchange Impaired  Goal: Optimal Gas Exchange  Outcome: Ongoing (see interventions/notes)     Problem: Hemodialysis  Goal: Safe, Effective Therapy Delivery  Outcome: Ongoing (see interventions/notes)  Goal: Effective Tissue Perfusion  Outcome: Ongoing (see interventions/notes)  Goal: Absence of Infection Signs and Symptoms  Outcome: Ongoing (see interventions/notes)

## 2021-11-13 NOTE — Nurses Notes (Signed)
Pt arrived from HD

## 2021-11-14 ENCOUNTER — Inpatient Hospital Stay (HOSPITAL_COMMUNITY): Payer: Medicare Other

## 2021-11-14 DIAGNOSIS — J9601 Acute respiratory failure with hypoxia: Secondary | ICD-10-CM

## 2021-11-14 DIAGNOSIS — F17200 Nicotine dependence, unspecified, uncomplicated: Secondary | ICD-10-CM | POA: Insufficient documentation

## 2021-11-14 DIAGNOSIS — R918 Other nonspecific abnormal finding of lung field: Secondary | ICD-10-CM

## 2021-11-14 DIAGNOSIS — Z992 Dependence on renal dialysis: Secondary | ICD-10-CM

## 2021-11-14 DIAGNOSIS — Z9981 Dependence on supplemental oxygen: Secondary | ICD-10-CM

## 2021-11-14 DIAGNOSIS — J9600 Acute respiratory failure, unspecified whether with hypoxia or hypercapnia: Secondary | ICD-10-CM

## 2021-11-14 MED ORDER — FUROSEMIDE 40 MG TABLET
80.0000 mg | ORAL_TABLET | Freq: Two times a day (BID) | ORAL | Status: DC
Start: 2021-11-14 — End: 2021-11-14
  Administered 2021-11-14: 80 mg via ORAL
  Filled 2021-11-14: qty 2

## 2021-11-14 MED ORDER — MONTELUKAST 10 MG TABLET
10.0000 mg | ORAL_TABLET | Freq: Every evening | ORAL | Status: DC
Start: 2021-11-14 — End: 2021-11-14
  Administered 2021-11-14: 10 mg via ORAL
  Filled 2021-11-14: qty 1

## 2021-11-14 MED ORDER — ISOSORBIDE MONONITRATE ER 60 MG TABLET,EXTENDED RELEASE 24 HR
120.0000 mg | ORAL_TABLET | Freq: Every morning | ORAL | Status: DC
Start: 2021-11-14 — End: 2021-11-14
  Administered 2021-11-14: 0 mg via ORAL
  Filled 2021-11-14 (×2): qty 2

## 2021-11-14 MED ORDER — CLONIDINE 0.1 MG/24 HR WEEKLY TRANSDERMAL PATCH
0.3000 mg | MEDICATED_PATCH | TRANSDERMAL | Status: DC
Start: 2021-11-14 — End: 2021-11-14
  Administered 2021-11-14: 0 mg via TRANSDERMAL

## 2021-11-14 MED ORDER — SODIUM CHLORIDE 0.9 % (FLUSH) INJECTION SYRINGE
3.0000 mL | INJECTION | Freq: Three times a day (TID) | INTRAMUSCULAR | Status: DC
Start: 2021-11-14 — End: 2021-11-14
  Administered 2021-11-14: 0 mL
  Administered 2021-11-14: 3 mL

## 2021-11-14 MED ORDER — CALCIUM ACETATE(PHOSPHATE BINDERS) 667 MG CAPSULE
1334.0000 mg | ORAL_CAPSULE | Freq: Three times a day (TID) | ORAL | Status: DC
Start: 2021-11-14 — End: 2021-11-14
  Administered 2021-11-14 (×2): 0 mg via ORAL
  Filled 2021-11-14: qty 2

## 2021-11-14 MED ORDER — TRAMADOL 50 MG TABLET
50.0000 mg | ORAL_TABLET | Freq: Four times a day (QID) | ORAL | Status: DC | PRN
Start: 2021-11-14 — End: 2021-11-14

## 2021-11-14 MED ORDER — IPRATROPIUM 0.5 MG-ALBUTEROL 3 MG (2.5 MG BASE)/3 ML NEBULIZATION SOLN
3.0000 mL | INHALATION_SOLUTION | Freq: Four times a day (QID) | RESPIRATORY_TRACT | Status: DC | PRN
Start: 2021-11-14 — End: 2021-11-14
  Administered 2021-11-14: 3 mL via RESPIRATORY_TRACT
  Filled 2021-11-14: qty 3

## 2021-11-14 MED ORDER — SODIUM CHLORIDE 0.9 % (FLUSH) INJECTION SYRINGE
3.0000 mL | INJECTION | INTRAMUSCULAR | Status: DC | PRN
Start: 2021-11-14 — End: 2021-11-14

## 2021-11-14 MED ORDER — APIXABAN 5 MG TABLET
2.5000 mg | ORAL_TABLET | Freq: Two times a day (BID) | ORAL | Status: DC
Start: 2021-11-14 — End: 2021-11-14
  Administered 2021-11-14 (×2): 0 mg via ORAL
  Filled 2021-11-14: qty 1

## 2021-11-14 MED ORDER — LABETALOL 200 MG TABLET
300.0000 mg | ORAL_TABLET | Freq: Two times a day (BID) | ORAL | Status: DC
Start: 2021-11-14 — End: 2021-11-14
  Administered 2021-11-14 (×2): 300 mg via ORAL
  Filled 2021-11-14 (×2): qty 2

## 2021-11-14 MED ORDER — BUDESONIDE-FORMOTEROL HFA 160 MCG-4.5 MCG/ACTUATION AEROSOL INHALER
2.0000 | INHALATION_SPRAY | Freq: Two times a day (BID) | RESPIRATORY_TRACT | Status: DC
Start: 2021-11-14 — End: 2021-11-14
  Administered 2021-11-14: 0 via RESPIRATORY_TRACT
  Administered 2021-11-14: 2 via RESPIRATORY_TRACT
  Filled 2021-11-14: qty 12

## 2021-11-14 MED ORDER — CALCITRIOL 0.25 MCG CAPSULE
1.0000 ug | ORAL_CAPSULE | ORAL | Status: DC
Start: 2021-11-15 — End: 2021-11-14

## 2021-11-14 MED ORDER — ATORVASTATIN 40 MG TABLET
40.0000 mg | ORAL_TABLET | Freq: Every evening | ORAL | Status: DC
Start: 2021-11-14 — End: 2021-11-14
  Administered 2021-11-14: 40 mg via ORAL
  Filled 2021-11-14: qty 1

## 2021-11-14 MED ORDER — EPOETIN ALFA-EPBX 10,000 UNIT/ML INJECTION SOLUTION
10000.0000 [IU] | INTRAMUSCULAR | Status: DC
Start: 2021-11-14 — End: 2021-11-14

## 2021-11-14 MED ORDER — CINACALCET 30 MG TABLET
30.0000 mg | ORAL_TABLET | Freq: Every evening | ORAL | Status: DC
Start: 2021-11-14 — End: 2021-11-14
  Administered 2021-11-14: 0 mg via ORAL

## 2021-11-14 MED ORDER — CLOPIDOGREL 75 MG TABLET
75.0000 mg | ORAL_TABLET | Freq: Every day | ORAL | Status: DC
Start: 2021-11-14 — End: 2021-11-14
  Administered 2021-11-14: 75 mg via ORAL
  Filled 2021-11-14: qty 1

## 2021-11-14 MED ORDER — PANTOPRAZOLE 40 MG TABLET,DELAYED RELEASE
40.0000 mg | DELAYED_RELEASE_TABLET | Freq: Every day | ORAL | Status: DC
Start: 2021-11-14 — End: 2021-11-14
  Administered 2021-11-14: 40 mg via ORAL
  Filled 2021-11-14: qty 1

## 2021-11-14 MED ORDER — DOCUSATE SODIUM 100 MG CAPSULE
100.0000 mg | ORAL_CAPSULE | Freq: Two times a day (BID) | ORAL | Status: DC | PRN
Start: 2021-11-14 — End: 2021-11-14

## 2021-11-14 MED ORDER — DOXAZOSIN 4 MG TABLET
4.0000 mg | ORAL_TABLET | Freq: Every evening | ORAL | Status: DC
Start: 2021-11-14 — End: 2021-11-14
  Administered 2021-11-14: 4 mg via ORAL
  Filled 2021-11-14: qty 1

## 2021-11-14 MED ORDER — CYCLOBENZAPRINE 10 MG TABLET
5.0000 mg | ORAL_TABLET | Freq: Two times a day (BID) | ORAL | Status: DC | PRN
Start: 2021-11-14 — End: 2021-11-14

## 2021-11-14 MED ORDER — ACETAMINOPHEN 325 MG TABLET
650.0000 mg | ORAL_TABLET | ORAL | Status: DC | PRN
Start: 2021-11-14 — End: 2021-11-14

## 2021-11-14 MED ORDER — FAMOTIDINE 40 MG TABLET
40.0000 mg | ORAL_TABLET | Freq: Every day | ORAL | Status: DC
Start: 2021-11-14 — End: 2021-11-14
  Administered 2021-11-14: 40 mg via ORAL
  Filled 2021-11-14: qty 1

## 2021-11-14 MED ORDER — HYDRALAZINE 50 MG TABLET
100.0000 mg | ORAL_TABLET | Freq: Three times a day (TID) | ORAL | Status: DC
Start: 2021-11-14 — End: 2021-11-14
  Administered 2021-11-14: 100 mg via ORAL
  Administered 2021-11-14: 0 mg via ORAL
  Filled 2021-11-14 (×3): qty 2

## 2021-11-14 MED ORDER — NITROGLYCERIN 0.4 MG SUBLINGUAL TABLET
0.4000 mg | SUBLINGUAL_TABLET | SUBLINGUAL | Status: DC | PRN
Start: 2021-11-14 — End: 2021-11-14

## 2021-11-14 MED ORDER — HYDROMORPHONE 2 MG/ML INJECTION WRAPPER
0.2000 mg | INJECTION | Freq: Once | INTRAMUSCULAR | Status: AC
Start: 2021-11-14 — End: 2021-11-14
  Administered 2021-11-14: 0.2 mg via INTRAVENOUS
  Filled 2021-11-14: qty 1

## 2021-11-14 MED ORDER — CLONIDINE HCL 0.2 MG TABLET
0.2000 mg | ORAL_TABLET | Freq: Two times a day (BID) | ORAL | Status: DC
Start: 2021-11-14 — End: 2021-11-14
  Administered 2021-11-14 (×2): 0.2 mg via ORAL
  Filled 2021-11-14 (×2): qty 1

## 2021-11-14 MED ORDER — SPIRONOLACTONE 25 MG TABLET
50.0000 mg | ORAL_TABLET | Freq: Two times a day (BID) | ORAL | Status: DC
Start: 2021-11-14 — End: 2021-11-14
  Administered 2021-11-14: 0 mg via ORAL
  Filled 2021-11-14: qty 2

## 2021-11-14 MED ORDER — AMLODIPINE 10 MG TABLET
10.0000 mg | ORAL_TABLET | Freq: Every day | ORAL | Status: DC
Start: 2021-11-14 — End: 2021-11-14
  Administered 2021-11-14: 10 mg via ORAL
  Filled 2021-11-14: qty 1

## 2021-11-14 MED ORDER — LOSARTAN 50 MG TABLET
100.0000 mg | ORAL_TABLET | Freq: Every evening | ORAL | Status: DC
Start: 2021-11-14 — End: 2021-11-14
  Administered 2021-11-14: 100 mg via ORAL
  Filled 2021-11-14: qty 2

## 2021-11-14 NOTE — Nurses Notes (Signed)
Patient being discharged to home at this time. AVS given to patient. IV removed with catheter intact. Telemetry removed. All follow up appointments made.

## 2021-11-14 NOTE — Consults (Signed)
[name]    NEPHROLOGY CONSULTATION NOTE       [name] 46 y.o. female 328/A   Date of Service: 11/14/2021    Date of Admission:  11/13/2021   PCP: Cam Hai, FNP Code Status:Prior       Reason for Consultation:  End-stage renal disease on hemodialysis.  Fluid overload     HPI:   46 year old female with past medical history of end-stage renal disease on hemodialysis was found anemic went to the emergency room in another facility received the blood she developed worsening shortness of breath was sent to our facility on BiPAP to arrange for dialysis.      Shortness of breath is better.  No nausea no vomiting.  No diarrhea.  We will arrange for ultrafiltration session today.  Patient is agreeable to go for extra treatment.      ROS:   Systematic review of 12 organ systems was negative except what mentioned in in the HPI.    Current medications   No current facility-administered medications for this encounter.    Medication list reviewed this morning.  Please refer to medication list      Physical:  Filed Vitals:    11/14/21 0811 11/14/21 0830 11/14/21 1107 11/14/21 1400   BP:  (!) 182/103  (!) 149/92   Pulse:  89 73 68   Resp:  17     Temp:  36.6 C (97.8 F)     SpO2: 94% 95%  96%        Intake/Output Summary (Last 24 hours) at 11/14/2021 1757  Last data filed at 11/14/2021 0718  Gross per 24 hour   Intake 220 ml   Output --   Net 220 ml        Patient is awake.  NAD.  Neck exam no JVD normal inspection.  Cardiovascular system: Regular rate and rhythm no murmurs rubs or gallops. No chest wall tenderness  Lungs:  Fine crackles bilaterally.  Abdomen soft nontender nondistended.  Extremities no clubbing cyanosis or edema   Neuro exam: EOMI, normal speech  Right chest PermCath.    Labs:  CBC:       \     /          /   \          BMP:            /               \                 Diagnostic studies:  Imaging:   XR AP MOBILE CHEST  Narrative: Valari Chuck    PROCEDURE DESCRIPTION:  XR PORTABLE CHEST X-RAY    CLINICAL HISTORY:Acute respiratory failure    COMPARISON:11/13/2021        FINDINGS:  A single view of the chest was obtained. Bilateral fairly diffuse infiltrates are again identified and appear similar to the previous examination given differences in technique. The heart size is at the upper limits of normal. No pleural effusion or pneumothorax is seen.    The hemodialysis catheter is unchanged in position. A left axillary vein stent is noted.  Impression: Bilateral fairly diffuse infiltrates are again identified and appear similar to the previous examination given differences in technique.    Radiologist location ID: [id number]      Assessments:  Active Hospital Problems   (*Primary Problem)    Diagnosis    *  Fluid overload    Tobacco use disorder    Hypertensive urgency    Acute respiratory failure with hypoxia (CMS HCC)    Anemia in chronic kidney disease    Tobacco dependence    End-stage renal disease on hemodialysis (CMS HCC)       Plan:       End-stage renal disease on hemodialysis  -Continue dialysis 3 times a week on MWF.    -extra ultrafiltration session today.  -please refer to dialysis orders    Anemia  -Monitor  -Epogen    CKD MBD  -Phosphorus, take binders.  -Renal diet    Acid-base  -Stable  -Continue dialysis    Electrolytes  -Monitor  -See dialysis orders  -Replace as needed    Volume status/fluid overload  -start dialysis today  -Fluid restriction  -Dialysis    Diet  -Renal diet      After dialysis, from Nephrology standpoint, patient is okay for discharge.        MY ORDERS LAST 24 (24h ago, onward)       Start     Ordered    11/14/21 1100  HYDROmorphone (DILAUDID) 2 mg/mL injection  ONCE         11/14/21 0926    11/14/21 0930  HEMODIALYSIS  ONE TIME,   Status:  Canceled        Comments: UF only   Heparin:  2000 units at the start of dialysis,   Contact me for any questions    11/14/21 0927    11/14/21 0930  [facility] PATIENT PRE AND POST DIALYSIS TREATMENT  EVERY OTHER  DAY,   Status:  Canceled         11/14/21 [place], MD, FASN, 11/14/2021, 17:57

## 2021-11-14 NOTE — Nurses Notes (Signed)
Patient to dialysis at this time.

## 2021-11-14 NOTE — Care Plan (Signed)
Patient has been awake most of the night. Complained of pain, but denied tramadol. Has had RT up here multiple times through the night to take Bi-pap on and off. BP of 202/120 at 2300. Dr notified. BP went down to 176/101 after her night time home medications.   Problem: Adult Inpatient Plan of Care  Goal: Plan of Care Review  Outcome: Ongoing (see interventions/notes)  Goal: Patient-Specific Goal (Individualized)  Outcome: Ongoing (see interventions/notes)  Goal: Absence of Hospital-Acquired Illness or Injury  Outcome: Ongoing (see interventions/notes)  Goal: Optimal Comfort and Wellbeing  Outcome: Ongoing (see interventions/notes)  Goal: Rounds/Family Conference  Outcome: Ongoing (see interventions/notes)     Problem: Gas Exchange Impaired  Goal: Optimal Gas Exchange  Outcome: Ongoing (see interventions/notes)     Problem: Hemodialysis  Goal: Safe, Effective Therapy Delivery  Outcome: Ongoing (see interventions/notes)  Goal: Effective Tissue Perfusion  Outcome: Ongoing (see interventions/notes)  Goal: Absence of Infection Signs and Symptoms  Outcome: Ongoing (see interventions/notes)     Problem: Hemodialysis  Goal: Effective Tissue Perfusion  Outcome: Ongoing (see interventions/notes)     Problem: Hemodialysis  Goal: Absence of Infection Signs and Symptoms  Outcome: Ongoing (see interventions/notes)

## 2021-11-14 NOTE — Consults (Signed)
[facility]  Palliative Care Nurse Practitioner  Consult Note    [name], 46 y.o. female  Date of Birth:  20-Sep-1975  MRN: [id number]  Admit Date: 11/13/2021   Attending: Hospitalist  Cam Hai, FNP   Reason for Consult: Goal Coordination    Chief Complaint:  Shortness of breath    HPI:  [name] is a 46 y.o. female who presented to the ED with increasing shortness of breath.  Hgb was 6.8 in [facility] ED and was given PRBC's, went into respiratory distress due to fluid overload, required BiPAP and transfer to [facility] ED,  and received emergent dialysis upon arrival.  Chest x-ray showed pulmonary edema.  Patient admitted with acute on chronic hypoxic respiratory failure due to fluid overload, hypertensive urgency, CAD, ESRD on HD, anemia due to chronic disease, tobacco dependence, hx COPD.  Pt tx with nephrology consult for dialysis, oxygen, home meds for BP/CAD, monitor H/H, nicotine patch, bronchodilators.  Hospitalist documented BP uncontrolled likely due to noncompliance with recurrent episodes of hypertensive urgency.  H/H 8.6/26.3 on 11/13/21.      Subjective:  Patient seen while in dialysis.  She states that her breathing has improved and feels that she is at her baseline shortness of breath.  Voices no other complaints.      Review of Systems:  ROS: Other than ROS in the HPI, all other systems were negative.    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    Tricuspid valve regurgitation     Uncontrolled hypertension          Past Surgical History:   Procedure Laterality  Date    CORONARY ARTERY ANGIOPLASTY      ESOPHAGOGASTRODUODENOSCOPY      HX BACK SURGERY      HX CHOLECYSTECTOMY      HX FOOT SURGERY Right     HX HYSTERECTOMY      HX TONSILLECTOMY            Family Medical History:       Problem Relation (Age of Onset)    Breast Cancer Mother    Coronary Artery Disease Mother    Diabetes type II Mother    Hypertension (High Blood Pressure) Father            Social Determinants of Health     Financial Resource Strain: No   Transportation Needs: None   Social Connections: Family interaction   Intimate Partner Violence: None   Housing Stability: Has housing     Social History     Socioeconomic History    Marital status: Divorced   Tobacco Use    Smoking status: Every Day     Packs/day: 1.00     Years: 10.00     Pack years: 10.00     Types: Cigarettes    Smokeless tobacco: Former    Tobacco comments:     "  Haven't had a cigerette in 2 weeks."   Vaping Use    Vaping Use: Never used   Substance and Sexual Activity    Alcohol use: Not Currently    Drug use: Yes     Types: Marijuana       Current Outpatient Medications   Medication Instructions    acetaminophen (TYLENOL) 650 mg, Oral, EVERY 4 HOURS PRN    amLODIPine (NORVASC) 10 mg, Oral, DAILY, Take one tablet every day    budesonide-formoteroL (SYMBICORT) 160-4.5 mcg/actuation Inhalation oral inhaler 2 Puffs, Inhalation, 2 TIMES DAILY    calcitrioL (ROCALTROL) 1 mcg, Oral, EVERY MO, WE AND FR    calcium acetate(phosphat bind) (PHOSLO) 1,334 mg, Oral, 3 TIMES DAILY WITH MEALS    cinacalcet (SENSIPAR) 30 mg, Oral, EVERY EVENING    cloNIDine (CATAPRES-TTS) 0.3 mg, Transdermal, EVERY 7 DAYS    cloNIDine HCL (CATAPRES) 0.2 mg, Oral, 2 TIMES DAILY, Take one tablet twice a day    clopidogreL (PLAVIX) 75 mg, Oral, DAILY    cyclobenzaprine (FLEXERIL) 5 mg, Oral, 2 TIMES DAILY PRN    doxazosin (CARDURA) 4 mg, Oral, EVERY EVENING    epoetin alfa-epbx (RETACRIT) 10,000 Units, Intravenous, GIVE IN DIALYSIS    famotidine (PEPCID) 40 mg, Oral, DAILY     furosemide (LASIX) 160 mg, Oral, DAILY, Take after dialysis on dialysis days; otherwise take in morning daily.<BR>    hydrALAZINE (APRESOLINE) 100 mg, Oral, EVERY 8 HOURS (SCHEDULED)    ipratropium-albuterol 0.5 mg-3 mg(2.5 mg base)/3 mL Solution for Nebulization 3 mL, Nebulization, 4 TIMES DAILY PRN    Isosorbide Mononitrate (IMDUR) 120 mg, Oral, EVERY MORNING    labetaloL (NORMODYNE) 300 mg, Oral, 2 TIMES DAILY    montelukast (SINGULAIR) 10 mg, Oral, EVERY EVENING    nitroGLYCERIN (NITROSTAT) 0.4 mg, Sublingual, EVERY 5 MIN PRN, for 3 doses over 15 minutes    pantoprazole (PROTONIX) 40 mg, Oral, DAILY    rosuvastatin (CRESTOR) 40 mg, Oral, EVERY EVENING, Take one tablet every day    traMADoL (ULTRAM) 50 mg, Oral, EVERY 6 HOURS PRN      Allergies   Allergen Reactions    Lisinopril Swelling     Tongue and throat swelling    Cardene [Nicardipine]  Other Adverse Reaction (Add comment)     Chest pain     Oxycodone Itching        Physical Exam:  Constitutional: no distress  Respiratory: Clear to auscultation bilaterally.   Cardiovascular: regular rate and rhythm  Gastrointestinal: Soft, non-tender, Bowel sounds normal, non-distended  Extremities: No edema  Integumentary:  Skin warm and dry  Neurologic: Alert and oriented x3    BP (!) 182/103   Pulse 89   Temp 36.6 C (97.8 F)   Resp 17   Ht 1.753 m (5' 9.02")   Wt 82.3 kg (181 lb 7 oz)   SpO2 95%   BMI 26.78 kg/m        Pain: Numeric 0     Labs:  Lab Results Today:    Results for orders placed or performed during the hospital encounter of 11/13/21 (from the past 24 hour(s))   TROPONIN-I IN THREE HOURS   Result Value Ref Range    TROPONIN I 109 (H) <15 ng/L       Imaging Studies:  XR AP MOBILE CHEST    Result Date: 11/13/2021  Impression NO SIGNIFICANT CHANGE SINCE THE PRIOR EXAM. Radiologist location ID: [id number]    Images and Reports reviewed to current  date.      Patient Active Problem List   Diagnosis    Tobacco dependence    Insomnia    Coronary artery  disease involving native coronary artery    Noncompliance    Anemia in chronic kidney disease    Fluid overload    Acute respiratory failure with hypoxia (CMS HCC)    End-stage renal disease on hemodialysis (CMS HCC)    Hypertensive urgency    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)    H/O heart artery stent        ACP:  Patient confirms her cousin Murriel Hopper and friend Prudence Davidson are her MPOA's.  Patient confirms she is agreeable to a temporary tube feeding as indicated in MPOA.  Copy of MPOA is on file.    Code Status:  Code status discussed in regards to patient wishes and underlying condition and patient wishes to remain a full code.      GOC:  Patient lives at home alone.  She states that she has a Insurance underwriter that comes in 4 days a week of the morning and then comes back in the afternoon after she gets home from dialysis.  She states that the worker does everything for her including assistance with ADL's, shopping, house work, cleaning, cooking.  Patient uses medical transport or the city bus for transportation.  She has a walker, power chair, BSC, shower chair at home.  Uses the power chair mostly for mobility.  Education and complications of CHF, ESRD, Asthma, COPD, CAD discussed with patient.  Patient had a stent placed last month and is not a candidate for CABG.  She states that she is very weak and gets short or breath easy at home.  Wears oxygen @ 2L NC ATC.  Uses inhalers and nebulizers at home.  Follows with Dr. Linton Rump for pulmonology, Dr. Viann Shove for cardiology, Dr. Liliane Shi for nephrology.  Patient is currently on community palliative services and would like those services continued when discharged.      PPS: 50%    Persons present and participating in discussion: Patient      Was the conversation voluntary? Yes      Plan:  Continue community palliative services when discharged    Patient Disposition/Discharge needs:  Home    ACP/GOC time:     16 minutes    Total visit time:     44 minutes  including ACP/GOC time, Face to face and reviewing medical records.    Thank you for this consult.    Kreg Shropshire, APRN,NP-C  Palliative Care Nurse Practitioner    This note may have been partially generated using Mmodal Fluency Direct system and there may be some incorrect words, spellings, and punctuation that were not noted in checking the note before saving.

## 2021-11-14 NOTE — Discharge Summary (Signed)
[facility]     DISCHARGE SUMMARY      PATIENT NAME:  [name]   MRN:  [id number]  DOB:  Feb 27, 1975    INPATIENT ADMISSION DATE: 11/13/2021   DATE OF DISCHARGE:  11/14/21     ATTENDING PHYSICIAN: Norman Herrlich, MD     PRIMARY CARE PHYSICIAN: Cam Hai, [place] PRESENTATION:    Please see full admission H&P for details.      As per HPI: 46 year old female past medical history of COPD coronary artery disease who is status post PCI with stent placement on August 15th who has end-stage renal disease on hemodialysis Monday Wednesday Friday coming in with shortness a breath.  Patient was seen in [facility] ER where she was noted to have anemia with hemoglobin up to 6.8 following which was transfused with 1 unit of packed red blood cells and ended up going into respiratory distress likely due to fluid overload.  She required BiPAP and was transferred to the ER here for further evaluation.  Patient has required emergent hemodialysis upon arrival.  Nephrology service is aware.  She is current getting hemodialysis she is awake alert able to answer questions.     At baseline she is on 2 L of oxygen by nasal cannula continuously.  Of note as per records from [facility] patient recently had PCI in August 15th but was not a candidate for CABG due to high-risk for CABG surgery.  Currently she will continues with medical management on Eliquis Plavix as well as Imdur.  Patient's blood pressure remains uncontrolled likely due to noncompliance with recurrent episodes of hypertensive urgency.       FURTHER HOSPITAL COURSE WITH DISCHARGE DIAGNOSES:  46 year old female with multiple medical problems including hypertensive urgency chronic hypoxic respiratory failure end-stage renal disease on hemodialysis tobacco dependence with COPD with oxygen dependent up to 2 L at home by nasal cannula coming in with shortness a breath following a blood transfusion.  Upon arrival in the ED patient  was placed on BiPAP and following admission patient received emergent hemodialysis.  This helped improve her respiratory symptoms and currently she is back to her baseline respiratory status on 2 L of oxygen by nasal cannula.  She chronically has uncontrolled blood pressure currently getting a repeat session of hemodialysis.  Patient states she wants to go home today and she will be discharged follow-up with the primary care provider and nephrologist for medication adjustment and hemodialysis.  Patient verbalized understanding discharge and follow-up instructions all questions answered.        Problem List:  Active Hospital Problems   (*Primary Problem)    Diagnosis    *Fluid overload    Hypertensive urgency    Acute respiratory failure with hypoxia (CMS HCC)    Anemia in chronic kidney disease    Tobacco dependence    End-stage renal disease on hemodialysis (CMS HCC)       PHYSICAL EXAM   DAY OF DISCHARGE:    BP (!) 182/103   Pulse 73   Temp 36.6 C (97.8 F)   Resp 17   Ht 1.753 m (5' 9.02")   Wt 82.3 kg (181 lb 7 oz)   SpO2 95%   BMI 26.78 kg/m   General:  Patient is resting in bed, no acute distress, alert and oriented x3   Eyes:  PERRL, no scleral icterus   HENT:  Normocephalic, atraumatic, oral mucosa is moist and pink, no nasal  discharge.  PermCath right infraclavicular area.  Heart:  RRR, S1 and S2 auscultated, no murmurs appreciated   Lungs:  Unlabored respirations.  Lungs are clear to auscultation bilaterally, no wheezes, no rales  Abdomen:  Soft, active bowel sounds, non-tender to palpation, non-distended  Extremities:  Pulses equal in all extremities bilaterally.  Capillary refill less than 3 seconds.  No edema in lower extremities bilaterally   Skin:  Warm and dry.  Not diaphoretic  Neuro:  A&O x 3.  No focal deficits.  Speech intact.  Not tremulous  Psych:  Cooperative, not agitated    LABS:  CBC with Diff (Last 48 Hours):    Recent Results in last 48 hours     11/12/21  1702 11/13/21  0348    WBC  --  8.0   HGB 9.2* 8.6*   HCT 27.9* 26.3*   MCV  --  91.2   PLTCNT  --  177   PMNS  --  80*   LYMPHOCYTES  --  8*   MONOCYTES  --  9   EOSINOPHIL  --  2   BASOPHILS  --  0  0.03          Last BMP  (Last result in the past 48 hours)        Na   K   Cl   CO2   BUN   Cr   Calcium   Glucose   Glucose-Fasting        11/13/21 0348 134   5.1   95   28   44   9.84   9.4   81               Last Hepatic Panel  (Last result in the past 48 hours)        Albumin   Total PTN   Total Bili   Direct Bili   Ast/SGOT   Alt/SGPT   Alk Phos        11/13/21 0348 3.3   8.1   0._0 89                BMP (Last 48 Hours):    Recent Results in last 48 hours     11/13/21  0348   SODIUM 134*   POTASSIUM 5.1   CHLORIDE 95*   CO2 28   BUN 44*   CREATININE 9.84*   CALCIUM 9.4   GLUCOSENF 81          DISCHARGE MEDICATIONS:     Current Discharge Medication List        CONTINUE these medications - NO CHANGES were made during your visit.        Details   acetaminophen 325 mg Tablet  Commonly known as: TYLENOL   650 mg, Oral, EVERY 4 HOURS PRN  Refills: 0     amLODIPine 10 mg Tablet  Commonly known as: NORVASC   10 mg, Oral, DAILY, Take one tablet every day  Refills: 0     budesonide-formoteroL 160-4.5 mcg/actuation oral inhaler  Commonly known as: SYMBICORT   2 Puffs, Inhalation, 2 TIMES DAILY  Refills: 0     calcitrioL 0.5 mcg Capsule  Commonly known as: ROCALTROL   1 mcg, Oral, EVERY MO, WE AND FR  Refills: 0     calcium acetate(phosphat bind) 667 mg Capsule  Commonly known as: PHOSLO   1,334 mg, Oral, 3 TIMES DAILY WITH  MEALS  Qty: 180 Capsule  Refills: 0     cinacalcet 30 mg Tablet  Commonly known as: SENSIPAR   30 mg, Oral, EVERY EVENING  Refills: 0     cloNIDine 0.3 mg/24 hr Patch Weekly  Commonly known as: CATAPRES-TTS   0.3 mg, Transdermal, EVERY 7 DAYS  Refills: 0     cloNIDine HCL 0.2 mg Tablet  Commonly known as: CATAPRES   0.2 mg, Oral, 2 TIMES DAILY, Take one tablet twice a day  Refills: 0     clopidogreL 75 mg  Tablet  Commonly known as: PLAVIX   75 mg, Oral, DAILY  Refills: 0     cyclobenzaprine 5 mg Tablet  Commonly known as: FLEXERIL   5 mg, Oral, 2 TIMES DAILY PRN  Refills: 0     doxazosin 4 mg Tablet  Commonly known as: CARDURA   4 mg, Oral, EVERY EVENING  Refills: 0     epoetin alfa-epbx 10,000 unit/mL Solution  Commonly known as: RETACRIT   10,000 Units, Intravenous, GIVE IN DIALYSIS  Refills: 0     famotidine 40 mg Tablet  Commonly known as: PEPCID   40 mg, Oral, DAILY  Refills: 0     furosemide 80 mg Tablet  Commonly known as: LASIX   160 mg, Oral, DAILY, Take after dialysis on dialysis days; otherwise take in morning daily.   Refills: 0     hydrALAZINE 100 mg Tablet  Commonly known as: APRESOLINE   100 mg, Oral, EVERY 8 HOURS (SCHEDULED)  Qty: 90 Tablet  Refills: 0     ipratropium-albuteroL 0.5 mg-3 mg(2.5 mg base)/3 mL nebulizer solution  Commonly known as: DUONEB   3 mL, Nebulization, 4 TIMES DAILY PRN  Refills: 0     Isosorbide Mononitrate 120 mg Tablet Sustained Release 24 hr  Commonly known as: IMDUR   120 mg, Oral, EVERY MORNING  Qty: 30 Tablet  Refills: 0     labetaloL 300 mg Tablet  Commonly known as: NORMODYNE   300 mg, Oral, 2 TIMES DAILY  Refills: 0     montelukast 10 mg Tablet  Commonly known as: SINGULAIR   10 mg, Oral, EVERY EVENING  Refills: 0     nitroGLYCERIN 0.4 mg Tablet, Sublingual  Commonly known as: NITROSTAT   0.4 mg, Sublingual, EVERY 5 MIN PRN, for 3 doses over 15 minutes  Qty: 3 Tablet  Refills: 0     pantoprazole 40 mg Tablet, Delayed Release (E.C.)  Commonly known as: PROTONIX   40 mg, Oral, DAILY  Refills: 0     rosuvastatin 40 mg Tablet  Commonly known as: CRESTOR   40 mg, Oral, EVERY EVENING, Take one tablet every day  Refills: 0     traMADoL 50 mg Tablet  Commonly known as: ULTRAM   50 mg, Oral, EVERY 6 HOURS PRN  Qty: 20 Tablet  Refills: 0                 DISCHARGE DISPOSITION:  Home follow up with her primary care provider and nephrologist for hemodialysis re-evaluation and  medication adjustment.    DISCHARGE INSTRUCTIONS:    Follow-up Information       Deel, Leona Carry, FNP In 1 day.    Specialty: NURSE PRACTITIONER  Why: FOLLOW-UP: [place], 2023 AT 2:30 PM WITH JESSICA DEEL  Contact information:  [address]  [place] [address]  [phone]  Beather Arbour, MD Follow up in 2 day(s).    Specialties: NEPHROLOGY, HOSPITALIST  Why: Follow-up for re-evaluation, medication adjustment with hemodialysis.  Contact information:  [address]  [address]  [place] [address]  [phone]                              DISCHARGE INSTRUCTION - CARDIAC DIET     Diet: CARDIAC DIET      DISCHARGE INSTRUCTION - ACTIVITY     Activity: AS TOLERATED       Follow-up Information       Deel, Leona Carry, FNP In 1 day.    Specialty: NURSE PRACTITIONER  Why: FOLLOW-UP: [place], 2023 AT 2:30 PM WITH JESSICA DEEL  Contact information:  [address]  [place] [address]  [phone]               Beather Arbour, MD Follow up in 2 day(s).    Specialties: NEPHROLOGY, HOSPITALIST  Why: Follow-up for re-evaluation, medication adjustment with hemodialysis.  Contact information:  [address]  [address]  [place] [address]  [phone]                                    Copies sent to Care Team         Relationship Specialty Notifications Start End    Deel, Leona Carry, FNP PCP - General NURSE PRACTITIONER  12/04/20     Phone: [phone] Fax: [phone]         [address] [place] [address]            Discharge and follow-up plan discussed with patient who verbalized understanding and agreed.  Time spent, 37 minutes.      Norman Herrlich, MD  [facility] HOSPITALIST

## 2021-11-15 ENCOUNTER — Telehealth (HOSPITAL_COMMUNITY): Payer: Self-pay

## 2021-11-16 LAB — ADULT ROUTINE BLOOD CULTURE, SET OF 2 BOTTLES (BACTERIA AND YEAST)
BLOOD CULTURE, ROUTINE: NO GROWTH
BLOOD CULTURE, ROUTINE: NO GROWTH

## 2021-11-18 ENCOUNTER — Emergency Department (HOSPITAL_BASED_OUTPATIENT_CLINIC_OR_DEPARTMENT_OTHER): Payer: Medicare Other

## 2021-11-18 ENCOUNTER — Encounter (HOSPITAL_BASED_OUTPATIENT_CLINIC_OR_DEPARTMENT_OTHER): Payer: Self-pay

## 2021-11-18 ENCOUNTER — Emergency Department (EMERGENCY_DEPARTMENT_HOSPITAL)
Admission: EM | Admit: 2021-11-18 | Discharge: 2021-11-18 | Disposition: A | Discharge status: Home / Self Care | Payer: Medicare Other | Source: Home / Self Care

## 2021-11-18 ENCOUNTER — Other Ambulatory Visit: Payer: Self-pay

## 2021-11-18 ENCOUNTER — Emergency Department
Admission: EM | Admit: 2021-11-18 | Discharge: 2021-11-18 | Discharge status: Against Medical Advice | Payer: Medicare Other | Attending: Emergency Medicine | Admitting: Emergency Medicine

## 2021-11-18 ENCOUNTER — Encounter (HOSPITAL_COMMUNITY): Payer: Self-pay | Admitting: Family Medicine

## 2021-11-18 DIAGNOSIS — R112 Nausea with vomiting, unspecified: Secondary | ICD-10-CM | POA: Insufficient documentation

## 2021-11-18 DIAGNOSIS — I509 Heart failure, unspecified: Secondary | ICD-10-CM | POA: Insufficient documentation

## 2021-11-18 DIAGNOSIS — F17210 Nicotine dependence, cigarettes, uncomplicated: Secondary | ICD-10-CM | POA: Insufficient documentation

## 2021-11-18 DIAGNOSIS — I440 Atrioventricular block, first degree: Secondary | ICD-10-CM | POA: Insufficient documentation

## 2021-11-18 DIAGNOSIS — I132 Hypertensive heart and chronic kidney disease with heart failure and with stage 5 chronic kidney disease, or end stage renal disease: Secondary | ICD-10-CM | POA: Insufficient documentation

## 2021-11-18 DIAGNOSIS — Z5329 Procedure and treatment not carried out because of patient's decision for other reasons: Secondary | ICD-10-CM | POA: Insufficient documentation

## 2021-11-18 DIAGNOSIS — R079 Chest pain, unspecified: Secondary | ICD-10-CM | POA: Insufficient documentation

## 2021-11-18 DIAGNOSIS — Z992 Dependence on renal dialysis: Secondary | ICD-10-CM | POA: Insufficient documentation

## 2021-11-18 DIAGNOSIS — I10 Essential (primary) hypertension: Secondary | ICD-10-CM

## 2021-11-18 DIAGNOSIS — N179 Acute kidney failure, unspecified: Secondary | ICD-10-CM

## 2021-11-18 DIAGNOSIS — I5032 Chronic diastolic (congestive) heart failure: Secondary | ICD-10-CM | POA: Insufficient documentation

## 2021-11-18 DIAGNOSIS — I110 Hypertensive heart disease with heart failure: Secondary | ICD-10-CM

## 2021-11-18 DIAGNOSIS — J449 Chronic obstructive pulmonary disease, unspecified: Secondary | ICD-10-CM | POA: Insufficient documentation

## 2021-11-18 DIAGNOSIS — R778 Other specified abnormalities of plasma proteins: Secondary | ICD-10-CM | POA: Insufficient documentation

## 2021-11-18 DIAGNOSIS — N186 End stage renal disease: Secondary | ICD-10-CM | POA: Insufficient documentation

## 2021-11-18 LAB — COMPREHENSIVE METABOLIC PANEL, NON-FASTING
ALBUMIN/GLOBULIN RATIO: 0.7 — ABNORMAL LOW (ref 0.8–1.4)
ALBUMIN: 3.5 g/dL (ref 3.4–5.0)
ALKALINE PHOSPHATASE: 92 U/L (ref 46–116)
ALT (SGPT): <6 U/L (ref <=78)
ANION GAP: 14 mmol/L — ABNORMAL HIGH (ref 4–13)
AST (SGOT): 19 U/L (ref 15–37)
BILIRUBIN TOTAL: 0.5 mg/dL (ref 0.2–1.0)
BUN/CREA RATIO: 4
BUN: 42 mg/dL — ABNORMAL HIGH (ref 7–18)
CALCIUM, CORRECTED: 10.6 mg/dL
CALCIUM: 10.1 mg/dL (ref 8.5–10.1)
CHLORIDE: 97 mmol/L — ABNORMAL LOW (ref 98–107)
CO2 TOTAL: 24 mmol/L (ref 21–32)
CREATININE: 11.73 mg/dL — ABNORMAL HIGH (ref 0.55–1.02)
ESTIMATED GFR: 4 mL/min/{1.73_m2} — ABNORMAL LOW (ref >59)
GLOBULIN: 5
GLUCOSE: 101 mg/dL (ref 74–106)
OSMOLALITY, CALCULATED: 281 mOsm/kg (ref 270–290)
POTASSIUM: 4.3 mmol/L (ref 3.5–5.1)
PROTEIN TOTAL: 8.5 g/dL — ABNORMAL HIGH (ref 6.4–8.2)
SODIUM: 135 mmol/L — ABNORMAL LOW (ref 136–145)

## 2021-11-18 LAB — CBC WITH DIFF
BASOPHIL #: 0.07 10*3/uL (ref 0.00–0.30)
BASOPHIL #: 0.1 10*3/uL (ref 0.00–0.10)
BASOPHIL %: 1 % (ref 0–3)
BASOPHIL %: 1 % (ref 0–1)
EOSINOPHIL #: 0.13 10*3/uL (ref 0.00–0.80)
EOSINOPHIL #: 0.1 10*3/uL (ref 0.00–0.50)
EOSINOPHIL %: 1 % (ref 0–7)
EOSINOPHIL %: 1 %
HCT: 28.5 % — ABNORMAL LOW (ref 37.0–47.0)
HCT: 27.6 % — ABNORMAL LOW (ref 31.2–41.9)
HGB: 9.2 g/dL — ABNORMAL LOW (ref 12.5–16.0)
HGB: 9.2 g/dL — ABNORMAL LOW (ref 10.9–14.3)
LYMPHOCYTE #: 0.88 10*3/uL — ABNORMAL LOW (ref 1.10–5.00)
LYMPHOCYTE #: 0.9 10*3/uL — ABNORMAL LOW (ref 1.00–3.00)
LYMPHOCYTE %: 9 % — ABNORMAL LOW (ref 25–45)
LYMPHOCYTE %: 11 % — ABNORMAL LOW (ref 16–44)
MCH: 29.4 pg (ref 27.0–32.0)
MCH: 29.2 pg (ref 24.7–32.8)
MCHC: 32.4 g/dL (ref 32.0–36.0)
MCHC: 33.3 g/dL (ref 32.3–35.6)
MCV: 90.8 fL (ref 78.0–99.0)
MCV: 87.5 fL (ref 75.5–95.3)
MONOCYTE #: 0.91 10*3/uL (ref 0.00–1.30)
MONOCYTE #: 1 10*3/uL (ref 0.30–1.00)
MONOCYTE %: 9 % (ref 0–12)
MONOCYTE %: 11 % (ref 5–13)
MPV: 7.9 fL (ref 7.4–10.4)
MPV: 7 fL — ABNORMAL LOW (ref 7.9–10.8)
NEUTROPHIL #: 7.92 10*3/uL (ref 1.80–8.40)
NEUTROPHIL #: 6.5 10*3/uL (ref 1.85–7.80)
NEUTROPHIL %: 80 % — ABNORMAL HIGH (ref 40–76)
NEUTROPHIL %: 76 % (ref 43–77)
PLATELETS: 296 10*3/uL (ref 140–440)
PLATELETS: 341 10*3/uL (ref 140–440)
RBC: 3.14 10*6/uL — ABNORMAL LOW (ref 4.20–5.40)
RBC: 3.15 10*6/uL — ABNORMAL LOW (ref 3.63–4.92)
RDW: 19 % — ABNORMAL HIGH (ref 11.6–14.8)
RDW: 18.8 % — ABNORMAL HIGH (ref 12.3–17.7)
WBC: 9.9 10*3/uL (ref 4.0–10.5)
WBC: 8.6 10*3/uL (ref 3.8–11.8)

## 2021-11-18 LAB — HEPATIC FUNCTION PANEL
ALBUMIN/GLOBULIN RATIO: 1.2 (ref 0.8–1.4)
ALBUMIN: 4.4 g/dL (ref 3.5–5.7)
ALKALINE PHOSPHATASE: 80 U/L (ref 34–104)
ALT (SGPT): 9 U/L (ref 7–52)
AST (SGOT): 16 U/L (ref 13–39)
BILIRUBIN DIRECT: 0.14 md/dL (ref <=0.20)
BILIRUBIN TOTAL: 0.7 mg/dL (ref 0.3–1.2)
BILIRUBIN, INDIRECT: 0.56 mg/dL (ref <=1)
GLOBULIN: 3.8 (ref 2.9–5.4)
PROTEIN TOTAL: 8.2 g/dL (ref 6.4–8.9)

## 2021-11-18 LAB — B-TYPE NATRIURETIC PEPTIDE (BNP),PLASMA: BNP: 1585 pg/mL — ABNORMAL HIGH (ref 5–100)

## 2021-11-18 LAB — NT-PROBNP: NT-PROBNP: >35000 pg/mL — ABNORMAL HIGH (ref <=125)

## 2021-11-18 LAB — TROPONIN-I
TROPONIN I: 87 ng/L — ABNORMAL HIGH (ref <15)
TROPONIN I: 74 ng/L — ABNORMAL HIGH (ref <15)
TROPONIN I: 105 ng/L — ABNORMAL HIGH (ref <15)
TROPONIN I: 111 ng/L — ABNORMAL HIGH (ref <15)

## 2021-11-18 LAB — BLUE TOP TUBE

## 2021-11-18 LAB — PT/INR
INR: 1.11 — ABNORMAL HIGH (ref 0.88–1.10)
PROTHROMBIN TIME: 12.9 seconds — ABNORMAL HIGH (ref 9.8–12.7)

## 2021-11-18 LAB — LIPASE: LIPASE: 38 U/L (ref 11–82)

## 2021-11-18 MED ORDER — FENTANYL (PF) 50 MCG/ML INJECTION SOLUTION
INTRAMUSCULAR | Status: AC
Start: 2021-11-18 — End: 2021-11-18
  Filled 2021-11-18: qty 2

## 2021-11-18 MED ORDER — ONDANSETRON 4 MG DISINTEGRATING TABLET
4.0000 mg | ORAL_TABLET | Freq: Three times a day (TID) | ORAL | 0 refills | Status: DC | PRN
Start: 2021-11-18 — End: 2022-01-30

## 2021-11-18 MED ORDER — HYDRALAZINE 20 MG/ML INJECTION SOLUTION
INTRAMUSCULAR | Status: AC
Start: 2021-11-18 — End: 2021-11-18
  Filled 2021-11-18: qty 1

## 2021-11-18 MED ORDER — SODIUM CHLORIDE 0.9 % (FLUSH) INJECTION SYRINGE
3.0000 mL | INJECTION | INTRAMUSCULAR | Status: DC | PRN
Start: 2021-11-18 — End: 2021-11-19

## 2021-11-18 MED ORDER — FUROSEMIDE 10 MG/ML INJECTION SOLUTION
INTRAMUSCULAR | Status: AC
Start: 2021-11-18 — End: 2021-11-18
  Filled 2021-11-18: qty 4

## 2021-11-18 MED ORDER — SODIUM CHLORIDE 0.9 % INTRAVENOUS SOLUTION
120.0000 mg | INTRAVENOUS | Status: AC
Start: 2021-11-18 — End: 2021-11-18
  Administered 2021-11-18: 0 mg via INTRAVENOUS
  Administered 2021-11-18: 120 mg via INTRAVENOUS
  Filled 2021-11-18: qty 12

## 2021-11-18 MED ORDER — ONDANSETRON HCL (PF) 4 MG/2 ML INJECTION SOLUTION
4.0000 mg | INTRAMUSCULAR | Status: AC
Start: 2021-11-18 — End: 2021-11-18
  Administered 2021-11-18: 4 mg via INTRAVENOUS

## 2021-11-18 MED ORDER — FENTANYL (PF) 50 MCG/ML INJECTION SOLUTION
50.0000 ug | INTRAMUSCULAR | Status: AC
Start: 2021-11-18 — End: 2021-11-18
  Administered 2021-11-18: 50 ug via INTRAVENOUS

## 2021-11-18 MED ORDER — FENTANYL (PF) 50 MCG/ML INJECTION SOLUTION
100.0000 ug | INTRAMUSCULAR | Status: AC
Start: 2021-11-18 — End: 2021-11-18
  Administered 2021-11-18: 100 ug via INTRAVENOUS

## 2021-11-18 MED ORDER — HYDRALAZINE 20 MG/ML INJECTION SOLUTION
20.0000 mg | INTRAMUSCULAR | Status: AC
Start: 2021-11-18 — End: 2021-11-18
  Administered 2021-11-18: 20 mg via INTRAVENOUS

## 2021-11-18 MED ORDER — SODIUM CHLORIDE 0.9 % (FLUSH) INJECTION SYRINGE
3.0000 mL | INJECTION | Freq: Three times a day (TID) | INTRAMUSCULAR | Status: DC
Start: 2021-11-18 — End: 2021-11-19
  Administered 2021-11-18: 3 mL

## 2021-11-18 MED ORDER — ONDANSETRON HCL (PF) 4 MG/2 ML INJECTION SOLUTION
INTRAMUSCULAR | Status: AC
Start: 2021-11-18 — End: 2021-11-18
  Filled 2021-11-18: qty 2

## 2021-11-18 MED ORDER — PROCHLORPERAZINE EDISYLATE 10 MG/2 ML (5 MG/ML) INJECTION SOLUTION
INTRAMUSCULAR | Status: AC
Start: 2021-11-18 — End: 2021-11-18
  Filled 2021-11-18: qty 2

## 2021-11-18 MED ORDER — CLONIDINE HCL 0.2 MG TABLET
ORAL_TABLET | ORAL | Status: AC
Start: 2021-11-18 — End: 2021-11-18
  Filled 2021-11-18: qty 1

## 2021-11-18 MED ORDER — NITROGLYCERIN 50 MG/250 ML (200 MCG/ML) IN 5 % DEXTROSE INTRAVENOUS
INTRAVENOUS | Status: AC
Start: 2021-11-18 — End: 2021-11-18
  Filled 2021-11-18: qty 250

## 2021-11-18 MED ORDER — CLONIDINE HCL 0.2 MG TABLET
0.2000 mg | ORAL_TABLET | ORAL | Status: AC
Start: 2021-11-18 — End: 2021-11-18
  Administered 2021-11-18: 0.2 mg via ORAL

## 2021-11-18 MED ORDER — PROCHLORPERAZINE EDISYLATE 10 MG/2 ML (5 MG/ML) INJECTION SOLUTION
10.0000 mg | INTRAMUSCULAR | Status: AC
Start: 2021-11-18 — End: 2021-11-18
  Administered 2021-11-18: 10 mg via INTRAVENOUS

## 2021-11-18 MED ORDER — NITROGLYCERIN 50 MG/250 ML (200 MCG/ML) IN 5 % DEXTROSE INTRAVENOUS
10.0000 ug/min | INTRAVENOUS | Status: DC
Start: 2021-11-18 — End: 2021-11-18
  Administered 2021-11-18: 10 ug/min via INTRAVENOUS

## 2021-11-18 MED ORDER — FUROSEMIDE 10 MG/ML INJECTION SOLUTION
INTRAMUSCULAR | Status: AC
Start: 2021-11-18 — End: 2021-11-18
  Filled 2021-11-18: qty 10

## 2021-11-18 NOTE — ED Nurses Note (Signed)
Patient reports she needs a ride home via EMS, patient reports she does not have a way home. Charge nurse made aware

## 2021-11-18 NOTE — ED Provider Notes (Signed)
[facility]  ED Primary Provider Note  History of Present Illness   Chief Complaint   Patient presents with    Abdominal Pain     [name] is a 46 y.o. female who had concerns including Abdominal Pain.  Arrival: The patient arrived by Ambulance    HPI  A 46 year old female patient presents by EMS with a chief complaint vomiting x4 days.  Patient denies any constipation or diarrhea denies any urinary symptoms.  Does report that she was evaluated today [facility] and was admitted here however she signed the [employer] so she could go have dialysis performed patient states after dialysis symptoms have remained the same.  Now reporting some chest pain.  Patient reports a history of CHF, COPD end-stage renal disorder and hypertension. Patient is MWF dialysis patient  Review of Systems   Pertinent positive and negative ROS as per HPI.  Historical Data   History Reviewed This Encounter: Medical History  Surgical History  Family History  Social History    Physical Exam   ED Triage Vitals [11/18/21 1946]   BP (Non-Invasive) (!) 142/89   Heart Rate 81   Respiratory Rate 20   Temperature 37.2 C (98.9 F)   SpO2 100 %   Weight 82.1 kg (181 lb)   Height 1.753 m (5\' 9" )     Physical Exam  Const: minor distress, active, obese  HENT:  Normocephalic, normal external appearance  EYES:  Pupils perrl  Neck:  Normal visual inspection, full range of motion, no lymphadenopathy  Chest: Normal visual inspection, no tenderness  Resp:  Normal respiratory effort  Cardio:  In regular rate and rhythm  GI:  Normal visual inspection soft to palpation, generalized tenderness  Musculoskeletal:  No CVA tenderness, normal range of motion  Derm:  No rashes or lesions noted, turgor normal.  No obvious wound noted  Neuro:  Patient awake oriented x3 moves all extremities  Psych:  Mental status is normal, speech and movement normal, mood appropriate for age and situation  Exam Notes:   Patient Data      Labs Ordered/Reviewed   CBC WITH DIFF - Abnormal; Notable for the following components:       Result Value    RBC 3.15 (*)     HGB 9.2 (*)     HCT 27.6 (*)     RDW 18.8 (*)     MPV 7.0 (*)     LYMPHOCYTE % 11 (*)     LYMPHOCYTE # 0.90 (*)     All other components within normal limits   B-TYPE NATRIURETIC PEPTIDE - Abnormal; Notable for the following components:    BNP 1,585 (*)     All other components within normal limits    Narrative:                                 Class 1: 101-250 pg/mL                              Class 2: 251-550 pg/mL                              Class 3: 551-900 pg/mL  Class 4: >901 pg/mL     The [employer] has developed a four-stage functional classification system for CHF that is based on a subjective interpretation of the severity of a patient's clinical signs and symptoms.    Class 1 - Patients have no limitations on physical activity and have no symptoms with ordinary physical activity.    Class 2 - Patients have a slight limitation of physical activity and have symptoms with ordinary physical activity.    Class 3 - Patients have a marked limitation of physical activity and have symptoms with less than ordinary physical activity, but not at rest.    Class 4 - Patients are unable to perform any physical activity without discomfort.   TROPONIN-I - Abnormal; Notable for the following components:    TROPONIN I 111 (*)     All other components within normal limits   TROPONIN-I - Abnormal; Notable for the following components:    TROPONIN I 105 (*)     All other components within normal limits   LIPASE - Normal   HEPATIC FUNCTION PANEL - Normal   CBC/DIFF    Narrative:     The following orders were created for panel order CBC/DIFF.  Procedure                               Abnormality         Status                     ---------                               -----------         ------                     CBC WITH ZOXW[[id number]]                 Abnormal            Final result                 Please view results for these tests on the individual orders.   BASIC METABOLIC PANEL   URINALYSIS, MACROSCOPIC AND MICROSCOPIC W/CULTURE REFLEX    Narrative:     The following orders were created for panel order URINALYSIS, MACROSCOPIC AND MICROSCOPIC W/CULTURE REFLEX [PRN ONLY].  Procedure                               Abnormality         Status                     ---------                               -----------         ------                     URINALYSIS, MACROSCOPIC[[id number]]  URINALYSIS, MICROSCOPIC[[id number]]                                                       Please view results for these tests on the individual orders.   URINALYSIS, MACROSCOPIC   URINALYSIS, MICROSCOPIC   EXTRA TUBES    Narrative:     The following orders were created for panel order EXTRA TUBES.  Procedure                               Abnormality         Status                     ---------                               -----------         ------                     BLUE TOP KPQA[[id number]]                                    In process                 GOLD TOP RTMY[[id number]]                                    In process                   Please view results for these tests on the individual orders.   BLUE TOP TUBE   GOLD TOP TUBE     No orders to display     Medical Decision Making        Medical Decision Making  I did consult with Dr. Deloria Lair for hospital admission did review the labs with him and he states the patient's labs are currently at her baseline and feels the patient can be discharged.    Amount and/or Complexity of Data Reviewed  Labs: ordered.  ECG/medicine tests: ordered and independent interpretation performed.    Risk  Prescription drug management.      ED Course as of 11/18/21 2217   [facility] Nov 18, 2021   2058 TROPONIN-I(!): 111   2100 Troponin 74 at 0737 this morning   2116 B-TYPE NATRIURETIC PEPTIDE(!): 1,585    2121 FINDINGS:  Right internal jugular catheter remains in place   The heart size is normal.   There is some indistinctness of the pulmonary vasculature suggesting interstitial edema slightly improved since 11/14/2021            IMPRESSION:  INTERSTITIAL EDEMA     2205 TROPONIN-I(!): 105         Medications Administered in the ED   NS flush syringe (3 mL Intracatheter Given 11/18/21 2200)   NS flush syringe (has no administration in time range)   ondansetron (ZOFRAN) 2 mg/mL injection (4 mg Intravenous Given 11/18/21 2000)     Clinical Impression   Chest pain, unspecified type (Primary)  Nausea & vomiting       Disposition: Discharged

## 2021-11-18 NOTE — ED Triage Notes (Signed)
Abdominal pain, N/V since Friday. Was seen at [facility] earlier today and accepted as inpatient here, but signed out AMA to go to dialysis.

## 2021-11-18 NOTE — ED Attending Handoff Note (Signed)
[facility] Emergency Department  Emergency Department  Course Note    Care/report received from Dr. Scarlett Presto, Ophelia Shoulder, MD at  07:47.  Per report:  [name] is a 46 y.o. female who had concerns including Hypertension.  PATIENT CAME IN FOR INTERMITTENT CHEST PAIN OVER THE PAST 3 DAYS.  PATIENT HAS NOT TAKEN HER ANTIHYPERTENSIVE MEDS SINCE FRIDAY.  PATIENT STATES SHE IS BEEN HAVING NAUSEA AND VOMITING FOR THE LAST 3 DAYS.  PATIENT DENIES ANY ABDOMINAL PAIN.  PATIENT DENIES ANY FEVER CHILLS.  PATIENT HAD DIALYSIS ON FRIDAY.  PATIENT STATED THAT SHE ALSO HAS SHORTNESS OF BREATH WITH THE CHEST PAIN.  PATIENT HAS ALWAYS HAD ELEVATED TROPONINS.    Pending labs/imaging/consults:  PATIENT IS AWAITING CONSULT WITH HOSPITALIST TO GET THE PATIENT ADMITTED AT [facility]  Plan:  PATIENT NEEDS TO BE ADMITTED FOR EMERGENCY HYPERTENSION.  PATIENT ALSO NEEDS DIALYSIS TODAY.    Course:  PATIENT HAS BEEN RECEIVING MULTIPLE ANTIHYPERTENSIVE MEDS BRINGING OR PRESSURE DOWN TO 160S OVER 1 TEENS.  PATIENT DENIES ANY CHEST PAIN AT THIS TIME.  I spoke to the patient and she stated she did not want to be admitted to the hospital.  She wants to sign out AMA and go to her dialysis appointment.  I explained to her that she could die because of her high blood pressure and CHF.  She could also get much worse than what she is now.  She understood but I told her and stated that I still want sign out.  Patient then signed and was adamant about leaving.       Disposition: AMA          Clinical Impression   Severe uncontrolled hypertension (Primary)   Acute renal failure superimposed on chronic kidney disease, on chronic dialysis, unspecified acute renal failure type (CMS HCC)   Chest pain, unspecified type   Elevated troponin   Congestive heart failure, unspecified HF chronicity, unspecified heart failure type (CMS HCC)         Ammie Ferrier, MD

## 2021-11-18 NOTE — ED Attending Note (Signed)
[place] emergency department         HISTORY OF PRESENT ILLNESS     Date:  11/18/2021  Patient's Name:  [name]  Date of Birth:  1975-04-04    PATIENT IS A 46-YEAR-OLD ON HEMODIALYSIS MONDAY WEDNESDAY FRIDAY DUE FOR DIALYSIS TODAY.  PATIENT WITH NAUSEA AND VOMITING OFF AND ON LAST 3 DAYS.  PATIENT STATES SHE IS BEEN UNABLE TO TAKE HER BLOOD PRESSURE MEDICINE SINCE FRIDAY LAST WEEK.  SHE DENIES ANY FEVER OR CHILLS SHE HAS INTERMITTENT CHEST PAIN.  PATIENT DENIES ANY FEVER OR CHILLS.        Review of Systems     Review of Systems   Constitutional: Negative.    HENT: Negative.     Respiratory:  Positive for chest tightness and shortness of breath.    Cardiovascular:  Positive for chest pain.   Gastrointestinal: Negative.    Endocrine: Negative.    Musculoskeletal: Negative.    Neurological: Negative.    Psychiatric/Behavioral: Negative.     All other systems reviewed and are negative.      Previous History     Past Medical History:  Past Medical History:   Diagnosis Date   . Asthma    . Chronic diastolic CHF (congestive heart failure) (CMS HCC)    . Dependence on supplemental oxygen    . Esophageal reflux    . ESRD (end stage renal disease) (CMS [place])    . History of anemia due to CKD    . MI (myocardial infarction) (CMS [place])    . Mitral valve regurgitation    . Pulmonary edema    . Sleep apnea    . 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 Tricuspid valve regurgitation    . Uncontrolled hypertension        Past Surgical History:  Past Surgical History:   Procedure Laterality Date   . Coronary artery angioplasty     . Esophagogastroduodenoscopy     . Hx back surgery     . Hx cholecystectomy     . Hx foot surgery Right    . Hx hysterectomy     . Hx tonsillectomy         Social History:  Social History     Tobacco Use   . Smoking status: Every Day     Packs/day: 1.00     Years: 10.00     Additional pack years:  0.00     Total pack years: 10.00     Types: Cigarettes   . Smokeless tobacco: Former   . Tobacco comments:     "Haven't had a cigerette in 2 weeks."   Vaping Use   . Vaping Use: Never used   Substance Use Topics   . Alcohol use: Not Currently   . Drug use: Yes     Types: Marijuana     Social History     Substance and Sexual Activity   Drug Use Yes   . Types: Marijuana       Family History:  Family History   Problem Relation Age of Onset   . Diabetes type II Mother    . Coronary Artery Disease Mother    . Breast Cancer Mother    . Hypertension (High Blood Pressure) Father        Medication History:  Current Outpatient Medications   Medication Sig   .  acetaminophen (TYLENOL) 325 mg Oral Tablet Take 2 Tablets (650 mg total) by mouth Every 4 hours as needed   . amLODIPine (NORVASC) 10 mg Oral Tablet Take 1 Tablet (10 mg total) by mouth Once a day Take one tablet every day   . budesonide-formoteroL (SYMBICORT) 160-4.5 mcg/actuation Inhalation oral inhaler Take 2 Puffs by inhalation Twice daily   . calcitrioL (ROCALTROL) 0.5 mcg Oral Capsule Take 2 Capsules (1 mcg total) by mouth Every Monday, Wednesday and Friday   . calcium acetate,phosphat bind, (PHOSLO) 667 mg Oral Capsule Take 2 Capsules (1,334 mg total) by mouth Three times daily with meals for 30 days   . cinacalcet (SENSIPAR) 30 mg Oral Tablet Take 1 Tablet (30 mg total) by mouth Every evening   . cloNIDine (CATAPRES-TTS) 0.3 mg/24 hr Transdermal Patch Weekly Place 1 Patch (0.3 mg total) on the skin Every 7 days   . cloNIDine HCL (CATAPRES) 0.2 mg Oral Tablet Take 1 Tablet (0.2 mg total) by mouth Twice daily Take one tablet twice a day   . clopidogreL (PLAVIX) 75 mg Oral Tablet Take 1 Tablet (75 mg total) by mouth Once a day   . cyclobenzaprine (FLEXERIL) 5 mg Oral Tablet Take 1 Tablet (5 mg total) by mouth Twice per day as needed for Muscle spasms   . doxazosin (CARDURA) 4 mg Oral Tablet Take 1 Tablet (4 mg total) by mouth Every evening   . epoetin alfa-epbx  (RETACRIT) 10,000 unit/mL Injection Solution Infuse 1 mL (10,000 Units total) into a venous catheter Give in Dialysis for 30 days   . famotidine (PEPCID) 40 mg Oral Tablet Take 1 Tablet (40 mg total) by mouth Once a day   . furosemide (LASIX) 80 mg Oral Tablet Take 2 Tablets (160 mg total) by mouth Once a day Take after dialysis on dialysis days; otherwise take in morning daily.   . hydrALAZINE (APRESOLINE) 100 mg Oral Tablet Take 1 Tablet (100 mg total) by mouth Every 8 hours for 30 days   . ipratropium-albuterol 0.5 mg-3 mg(2.5 mg base)/3 mL Solution for Nebulization Take 3 mL by nebulization Four times a day as needed   . isosorbide mononitrate (IMDUR) 120 mg Oral Tablet Sustained Release 24 hr Take 1 Tablet (120 mg total) by mouth Every morning for 30 days   . labetaloL (NORMODYNE) 300 mg Oral Tablet Take 1 Tablet (300 mg total) by mouth Twice daily   . montelukast (SINGULAIR) 10 mg Oral Tablet Take 1 Tablet (10 mg total) by mouth Every evening   . nitroGLYCERIN (NITROSTAT) 0.4 mg Sublingual Tablet, Sublingual Place 1 Tablet (0.4 mg total) under the tongue Every 5 minutes as needed for Chest pain for up to 3 doses for 3 doses over 15 minutes   . pantoprazole (PROTONIX) 40 mg Oral Tablet, Delayed Release (E.C.) Take 1 Tablet (40 mg total) by mouth Once a day   . rosuvastatin (CRESTOR) 40 mg Oral Tablet Take 1 Tablet (40 mg total) by mouth Every evening Take one tablet every day   . traMADoL (ULTRAM) 50 mg Oral Tablet Take 1 Tablet (50 mg total) by mouth Every 6 hours as needed for Pain       Allergies:  Allergies   Allergen Reactions   . Lisinopril Swelling     Tongue and throat swelling   . Cardene [Nicardipine]  Other Adverse Reaction (Add comment)     Chest pain    . Oxycodone Itching       Physical Exam  Vitals:    BP (!) 206/122   Pulse 84   Temp 36.8 C (98.3 F)   Resp 16   Ht 1.753 m (5\' 9" )   Wt 82.1 kg (181 lb)   SpO2 97%   BMI 26.73 kg/m           Physical Exam  Vitals and nursing note  reviewed.   Constitutional:       General: She is not in acute distress.     Appearance: Normal appearance. She is well-developed.   HENT:      Head: Normocephalic and atraumatic.      Right Ear: Tympanic membrane normal.      Left Ear: Tympanic membrane normal.      Nose: Nose normal.      Mouth/Throat:      Mouth: Mucous membranes are moist.   Eyes:      Conjunctiva/sclera: Conjunctivae normal.   Cardiovascular:      Rate and Rhythm: Regular rhythm. Tachycardia present.      Heart sounds: No murmur heard.  Pulmonary:      Effort: Pulmonary effort is normal. No respiratory distress.      Breath sounds: Normal breath sounds.   Abdominal:      General: Bowel sounds are normal.      Palpations: Abdomen is soft.      Tenderness: There is no abdominal tenderness.   Musculoskeletal:         General: No swelling.      Cervical back: Normal range of motion and neck supple.   Skin:     General: Skin is warm and dry.      Capillary Refill: Capillary refill takes less than 2 seconds.   Neurological:      Mental Status: She is alert and oriented to person, place, and time.   Psychiatric:         Mood and Affect: Mood normal.         Diagnostic Studies/Treatment     Medications:  Medications Administered in the ED   hydrALAZINE (APRESOLINE) injection 20 mg (has no administration in time range)   furosemide (LASIX) 120 mg in NS 100 mL IVPB (has no administration in time range)   prochlorperazine (COMPAZINE) 5 mg/mL injection (has no administration in time range)   fentaNYL (SUBLIMAZE) 50 mcg/mL injection (has no administration in time range)       New Prescriptions    No medications on file       Labs:    No results found for this or any previous visit (from the past 12 hour(s)).     Radiology:  XR CHEST AP    XR CHEST AP    (Results Pending)       ECG:   SINUS RHYTHM WITH FIRST-DEGREE AV BLOCK 86 BEATS PER MINUTE PR INTERVAL 210 NONSPECIFIC ST-T CHANGE            Differential diagnosis  CONGESTIVE HEART FAILURE, CHRONIC RENAL  FAILURE ON HEMODIALYSIS CHEST PAIN NONCARDIAC UNCONTROLLED HYPERTENSION    Course/Disposition/Plan     Course:     ED Course as of 11/18/21 0430   Mon Nov 18, 2021   0430 CHEST X-RAY PULMONARY EDEMA CHANGE       Disposition:    Data Unavailable    Condition at Disposition:   STABLE    Follow up:   No follow-up provider specified.    Clinical Impression:     Clinical Impression   Severe uncontrolled  hypertension (Primary)   Acute renal failure superimposed on chronic kidney disease, on chronic dialysis, unspecified acute renal failure type (CMS HCC)   Chest pain, unspecified type         Winfred Burn, MD

## 2021-11-18 NOTE — ED Nurses Note (Signed)
Pt c/o chest pressure that began "right before I called the rescue squad." Pt rates pain as a 6 out of 10. Also c/o of nausea and vomiting that began "a few days ago." Respirations are even and unlabored at this time. Pt is on 2L NC at home.

## 2021-11-18 NOTE — ED Triage Notes (Signed)
Per EMS, patient c/o nausea, vomiting tonight. Reports elevated blood pressure and intermittent chest pain. Was given NTG X 1 by EMS.

## 2021-11-18 NOTE — Discharge Instructions (Signed)
Thank you for allowing us to be part of your care.    Please discuss all medications with her pharmacist.  You may have received sedating medications during your visit, please discuss this with your discharging nurse as you may not be able to drive or operate machinery with this medication in your system.  If you feel your situation worsens or does not get better please see a physician for re-evaluation.

## 2021-11-19 LAB — ECG 12 LEAD
Atrial Rate: 76 {beats}/min
Calculated P Axis: 64 degrees
Calculated R Axis: -39 degrees
Calculated T Axis: 90 degrees
PR Interval: 214 ms
QRS Duration: 108 ms
QT Interval: 460 ms
QTC Calculation: 517 ms
Ventricular rate: 76 {beats}/min

## 2021-11-19 LAB — GOLD TOP TUBE

## 2021-11-20 LAB — TYPE AND SCREEN
ABO/RH(D): O POS
ANTIBODY SCREEN: NEGATIVE
UNITS ORDERED: 2

## 2021-11-20 LAB — CROSSMATCH RED CELLS - UNITS
UNIT DIVISION: 0
UNIT DIVISION: 0

## 2021-11-22 LAB — ECG 12 LEAD
Atrial Rate: 104 {beats}/min
Calculated P Axis: 63 degrees
Calculated R Axis: 14 degrees
Calculated T Axis: 80 degrees
PR Interval: 194 ms
QRS Duration: 104 ms
QT Interval: 352 ms
QTC Calculation: 462 ms
Ventricular rate: 104 {beats}/min

## 2021-11-23 LAB — ECG 12 LEAD
Atrial Rate: 86 {beats}/min
Calculated P Axis: 78 degrees
Calculated R Axis: 2 degrees
Calculated T Axis: 83 degrees
PR Interval: 210 ms
QRS Duration: 102 ms
QT Interval: 408 ms
QTC Calculation: 488 ms
Ventricular rate: 86 {beats}/min

## 2021-12-09 ENCOUNTER — Emergency Department
Admission: EM | Admit: 2021-12-09 | Discharge: 2021-12-09 | Disposition: A | Discharge status: Home / Self Care | Payer: Medicare Other | Attending: Emergency Medicine | Admitting: Emergency Medicine

## 2021-12-09 ENCOUNTER — Encounter (HOSPITAL_BASED_OUTPATIENT_CLINIC_OR_DEPARTMENT_OTHER): Payer: Self-pay

## 2021-12-09 ENCOUNTER — Emergency Department (HOSPITAL_BASED_OUTPATIENT_CLINIC_OR_DEPARTMENT_OTHER): Payer: Medicare Other

## 2021-12-09 ENCOUNTER — Other Ambulatory Visit: Payer: Self-pay

## 2021-12-09 DIAGNOSIS — I2510 Atherosclerotic heart disease of native coronary artery without angina pectoris: Secondary | ICD-10-CM | POA: Insufficient documentation

## 2021-12-09 DIAGNOSIS — D649 Anemia, unspecified: Secondary | ICD-10-CM

## 2021-12-09 DIAGNOSIS — Z1152 Encounter for screening for COVID-19: Secondary | ICD-10-CM | POA: Insufficient documentation

## 2021-12-09 DIAGNOSIS — Z7901 Long term (current) use of anticoagulants: Secondary | ICD-10-CM | POA: Insufficient documentation

## 2021-12-09 DIAGNOSIS — R079 Chest pain, unspecified: Secondary | ICD-10-CM | POA: Insufficient documentation

## 2021-12-09 DIAGNOSIS — S0502XA Injury of conjunctiva and corneal abrasion without foreign body, left eye, initial encounter: Secondary | ICD-10-CM | POA: Insufficient documentation

## 2021-12-09 DIAGNOSIS — I120 Hypertensive chronic kidney disease with stage 5 chronic kidney disease or end stage renal disease: Secondary | ICD-10-CM | POA: Insufficient documentation

## 2021-12-09 DIAGNOSIS — R9431 Abnormal electrocardiogram [ECG] [EKG]: Secondary | ICD-10-CM

## 2021-12-09 DIAGNOSIS — N186 End stage renal disease: Secondary | ICD-10-CM | POA: Insufficient documentation

## 2021-12-09 DIAGNOSIS — X58XXXA Exposure to other specified factors, initial encounter: Secondary | ICD-10-CM

## 2021-12-09 DIAGNOSIS — I10 Essential (primary) hypertension: Secondary | ICD-10-CM

## 2021-12-09 DIAGNOSIS — Z7902 Long term (current) use of antithrombotics/antiplatelets: Secondary | ICD-10-CM | POA: Insufficient documentation

## 2021-12-09 DIAGNOSIS — Z20822 Contact with and (suspected) exposure to covid-19: Secondary | ICD-10-CM | POA: Insufficient documentation

## 2021-12-09 DIAGNOSIS — Z992 Dependence on renal dialysis: Secondary | ICD-10-CM | POA: Insufficient documentation

## 2021-12-09 DIAGNOSIS — Z955 Presence of coronary angioplasty implant and graft: Secondary | ICD-10-CM | POA: Insufficient documentation

## 2021-12-09 LAB — COMPREHENSIVE METABOLIC PANEL, NON-FASTING
ALBUMIN/GLOBULIN RATIO: 0.7 — ABNORMAL LOW (ref 0.8–1.4)
ALBUMIN: 3.4 g/dL (ref 3.4–5.0)
ALKALINE PHOSPHATASE: 93 U/L (ref 46–116)
ALT (SGPT): 14 U/L (ref <=78)
ANION GAP: 13 mmol/L (ref 4–13)
AST (SGOT): 19 U/L (ref 15–37)
BILIRUBIN TOTAL: 0.6 mg/dL (ref 0.2–1.0)
BUN/CREA RATIO: 4
BUN: 52 mg/dL — ABNORMAL HIGH (ref 7–18)
CALCIUM, CORRECTED: 10.3 mg/dL
CALCIUM: 9.7 mg/dL (ref 8.5–10.1)
CHLORIDE: 92 mmol/L — ABNORMAL LOW (ref 98–107)
CO2 TOTAL: 26 mmol/L (ref 21–32)
CREATININE: 13.58 mg/dL — ABNORMAL HIGH (ref 0.55–1.02)
ESTIMATED GFR: 3 mL/min/{1.73_m2} — ABNORMAL LOW (ref >59)
GLOBULIN: 4.8
GLUCOSE: 105 mg/dL (ref 74–106)
OSMOLALITY, CALCULATED: 277 mOsm/kg (ref 270–290)
POTASSIUM: 5.3 mmol/L — ABNORMAL HIGH (ref 3.5–5.1)
PROTEIN TOTAL: 8.2 g/dL (ref 6.4–8.2)
SODIUM: 131 mmol/L — ABNORMAL LOW (ref 136–145)

## 2021-12-09 LAB — CBC WITH DIFF
BASOPHIL #: 0.04 10*3/uL (ref 0.00–0.30)
BASOPHIL %: 1 % (ref 0–3)
EOSINOPHIL #: 0.27 10*3/uL (ref 0.00–0.80)
EOSINOPHIL %: 4 % (ref 0–7)
HCT: 28.7 % — ABNORMAL LOW (ref 37.0–47.0)
HGB: 9.5 g/dL — ABNORMAL LOW (ref 12.5–16.0)
LYMPHOCYTE #: 0.94 10*3/uL — ABNORMAL LOW (ref 1.10–5.00)
LYMPHOCYTE %: 12 % — ABNORMAL LOW (ref 25–45)
MCH: 30.5 pg (ref 27.0–32.0)
MCHC: 33 g/dL (ref 32.0–36.0)
MCV: 92.6 fL (ref 78.0–99.0)
MONOCYTE #: 0.74 10*3/uL (ref 0.00–1.30)
MONOCYTE %: 10 % (ref 0–12)
MPV: 8.2 fL (ref 7.4–10.4)
NEUTROPHIL #: 5.55 10*3/uL (ref 1.80–8.40)
NEUTROPHIL %: 74 % (ref 40–76)
PLATELETS: 183 10*3/uL (ref 140–440)
RBC: 3.1 10*6/uL — ABNORMAL LOW (ref 4.20–5.40)
RDW: 20.2 % — ABNORMAL HIGH (ref 11.6–14.8)
WBC: 7.5 10*3/uL (ref 4.0–10.5)

## 2021-12-09 LAB — COVID-19, FLU A/B, RSV RAPID BY PCR
INFLUENZA VIRUS TYPE A: NOT DETECTED
INFLUENZA VIRUS TYPE B: NOT DETECTED
RESPIRATORY SYNCTIAL VIRUS (RSV): NOT DETECTED
SARS-CoV-2: NOT DETECTED

## 2021-12-09 LAB — ECG 12 LEAD
Atrial Rate: 72 {beats}/min
Calculated P Axis: 66 degrees
Calculated R Axis: 67 degrees
Calculated T Axis: 42 degrees
PR Interval: 204 ms
QRS Duration: 106 ms
QT Interval: 454 ms
QTC Calculation: 497 ms
Ventricular rate: 72 {beats}/min

## 2021-12-09 LAB — TROPONIN-I
TROPONIN I: 68 ng/L — ABNORMAL HIGH (ref <15)
TROPONIN I: 70 ng/L — ABNORMAL HIGH (ref <15)

## 2021-12-09 MED ORDER — CIPROFLOXACIN 0.3 % EYE DROPS
OPHTHALMIC | Status: AC
Start: 2021-12-09 — End: 2021-12-09
  Filled 2021-12-09: qty 5

## 2021-12-09 MED ORDER — HYDRALAZINE 20 MG/ML INJECTION SOLUTION
INTRAMUSCULAR | Status: AC
Start: 2021-12-09 — End: 2021-12-09
  Filled 2021-12-09: qty 1

## 2021-12-09 MED ORDER — CIPROFLOXACIN 0.3 % EYE DROPS
1.0000 [drp] | Freq: Four times a day (QID) | OPHTHALMIC | 0 refills | Status: DC
Start: 2021-12-09 — End: 2021-12-16

## 2021-12-09 MED ORDER — CIPROFLOXACIN 0.3 % EYE DROPS
1.0000 [drp] | Freq: Four times a day (QID) | OPHTHALMIC | Status: DC
Start: 2021-12-09 — End: 2021-12-09
  Administered 2021-12-09: 1 [drp] via OPHTHALMIC

## 2021-12-09 MED ORDER — TETRACAINE HCL (PF) 0.5 % EYE DROPS
OPHTHALMIC | Status: AC
Start: 2021-12-09 — End: 2021-12-09
  Filled 2021-12-09: qty 80

## 2021-12-09 MED ORDER — ASPIRIN 81 MG CHEWABLE TABLET
324.0000 mg | CHEWABLE_TABLET | ORAL | Status: DC
Start: 2021-12-09 — End: 2021-12-09
  Administered 2021-12-09: 0 mg via ORAL

## 2021-12-09 MED ORDER — BALANCED SALT SOLUTION COMBINATION NO.2 INTRAOCULAR IRRIGATION
INTRAOCULAR | Status: AC
Start: 2021-12-09 — End: 2021-12-09
  Filled 2021-12-09: qty 15

## 2021-12-09 MED ORDER — FLUORESCEIN 1 MG EYE STRIPS
ORAL_STRIP | OPHTHALMIC | Status: AC
Start: 2021-12-09 — End: 2021-12-09
  Filled 2021-12-09: qty 1

## 2021-12-09 MED ORDER — HYDRALAZINE 20 MG/ML INJECTION SOLUTION
20.0000 mg | INTRAMUSCULAR | Status: AC
Start: 2021-12-09 — End: 2021-12-09
  Administered 2021-12-09: 20 mg via INTRAVENOUS

## 2021-12-09 NOTE — ED Nurses Note (Signed)
Patient discharged home all instructions gone over with patient including medications with opportunity to ask questions. Patient verbalized understanding and ambulated off unit independently.

## 2021-12-09 NOTE — Discharge Instructions (Signed)
Take eyedrops as prescribed and to completion.  It is important that you go to dialysis today.  Return to emergency department if he have new worsening shortness of breath chest pain changes in her vision or any other concerning symptoms.  Thank you for visiting [place]

## 2021-12-09 NOTE — ED Nurses Note (Signed)
Patient informed we are giving hydralazine and will recheck her blood pressure prior to her leaving. Patient has dialysis today in [place] and will ride the bus

## 2021-12-09 NOTE — ED Nurses Note (Addendum)
Difficulty placing IV in right arm; pt states per her nephrologist, she can have IV access in L arm.

## 2021-12-09 NOTE — ED Nurses Note (Addendum)
Pt c/o of chest pain that started aprx 3hr PTA. Pt describes pain as pressure and denies that pain radiates. Pt also c/o of headache, cough, and L eye drainage and burning. Cough sounds congested and pt states she has had cough/ cold symptoms for about 1 week. Respirations even and unlabored at this time. No s/s of resp distress at this time. Pt is on 3L NC.  Pt does state chest pain is worse when she is coughing.

## 2021-12-09 NOTE — ED Provider Notes (Signed)
[facility], [facility] Emergency Department  ED Primary Provider Note  HPI:  [name] is a 46 y.o. female     Patient complains of pain scratch in his left eye.  No changes in vision feels like there might be a form object in there although she is no recollection.  Symptoms have been ongoing for about a day.  She does not wear contacts.  Patient also endorses chest tightness this is chronic and no different from her baseline she says.  It does get a bit worse when she coughs she is had a cough for about a week.  Nonproductive.  No fevers no chills no nausea no vomiting no belly pain.  Patient has a history of end-stage renal disease.  She is dialysis dependent Monday Wednesday Friday through right subclavian catheter.  Also has a history of CAD status post stent placement August 15th.  Patient maintained on Eliquis Plavix and Imdur.  She is COVID vaccinated.  Full code.    ROS review and negative aside from stated in HPI.    Physical Exam:  ED Triage Vitals [12/09/21 0527]   BP (Non-Invasive) (!) 146/106   Heart Rate 78   Respiratory Rate 20   Temperature 36.3 C (97.3 F)   SpO2 99 %   Weight 78 kg (171 lb 15.3 oz)   Height 1.753 m (5' 9" )     No acute distress.  She appears chronically ill.  Patient awake alert oriented x3.  Mood is appropriate.  Pupils 3 mm equal round reactive.  Fluorescein exam shows negative Seidel.  Patient has a small avoid defect the 7 oclock position over the cornea.  No associated linear abrasions.  Negative eye sweep. Extraocular movements are intact.  Oropharynx is clear.  Mucous membranes moist.  Trachea midline.  Neck is supple.  Heart has regular rate and rhythm without significant murmurs rubs or gallops.  Lungs are clear to auscultation.  Abdomen soft nontender, nondistended.  Moving all extremities without difficulty.  No rash no edema.  Left subclavian dialysis catheter site is clean and dry.  There is no fluctuance there is no tenderness.   Skin overall is dry.       Patient data:  Labs Ordered/Reviewed   COMPREHENSIVE METABOLIC PANEL, NON-FASTING - Abnormal; Notable for the following components:       Result Value    SODIUM 131 (*)     POTASSIUM 5.3 (*)     CHLORIDE 92 (*)     BUN 52 (*)     CREATININE 13.58 (*)     ESTIMATED GFR 3 (*)     ALBUMIN/GLOBULIN RATIO 0.7 (*)     All other components within normal limits    Narrative:     Estimated Glomerular Filtration Rate (eGFR) is calculated using the CKD-EPI (2021) equation, intended for patients 26 years of age and older. If gender is not documented or "unknown", there will be no eGFR calculation.   TROPONIN-I - Abnormal; Notable for the following components:    TROPONIN I 68 (*)     All other components within normal limits    Narrative:     Values received on females ranging between 12-15 ng/L MUST include the next serial troponin to review changes in the delta differences as the reference range for the Access II chemistry analyzer is lower than the established reference range.     TROPONIN-I - Abnormal; Notable for the following components:    TROPONIN I 70 (*)  All other components within normal limits    Narrative:     Values received on females ranging between 12-15 ng/L MUST include the next serial troponin to review changes in the delta differences as the reference range for the Access II chemistry analyzer is lower than the established reference range.     CBC WITH DIFF - Abnormal; Notable for the following components:    RBC 3.10 (*)     HGB 9.5 (*)     HCT 28.7 (*)     RDW 20.2 (*)     LYMPHOCYTE % 12 (*)     LYMPHOCYTE # 0.94 (*)     All other components within normal limits   CBC/DIFF    Narrative:     The following orders were created for panel order CBC/DIFF.  Procedure                               Abnormality         Status                     ---------                               -----------         ------                     CBC WITH YIRS[[id number]]                Abnormal             Final result                 Please view results for these tests on the individual orders.   TROPONIN-I   COVID-19, FLU A/B, RSV RAPID BY PCR     XR AP MOBILE CHEST   Final Result by Edi, Radresults In (10/16 0093)   NO SIGNIFICANT CHANGE SINCE THE PRIOR EXAM.         Radiologist location ID: [id number]           EKG read by me shows a sinus rhythm with a rate of 72, normal axis, QTC is 497.  I do not appreciate significant ST or T-wave changes.  No significant change compared to prior EKG     MDM:  Eye pain chest pain.  Broad DDX includes corneal abrasion/foreign body/ulcer, viral conjunctivitis.  Low suspicion for trigeminal neuralgia Ramsay Hunt.  Will evaluate for COVID pneumonia.  And also rule out ACS although chest pain appears very chronic.  Patient is not critically fluid overloaded.  Initial blood pressure 216/94 which is quite near patient's baseline.  Patient returned COVID influenza negative.  Corneal abrasion noted left eye.  Remainder of workup including serial troponin EKG CBC CMP  as expected or near patient's baseline.  I did review these findings with the patient and answered her questions.  She was given a dose of ciprofloxacin and hydralazine.  She also received aspirin per chest pain protocol she endorsed improvement of chest discomfort once her eye pain was treated.  Patient will be discharged with ciprofloxacin and instructed to continue her normal dialysis schedule.  I gave strict return to ED instructions in  both a verbal and written manner.  Patient comfortable with discharge.        Discharged  Clinical  Impression   Chest pain, unspecified type (Primary)   ESRD on dialysis (CMS [place])   Hypertension, accelerated   Abrasion of left cornea, initial encounter     Medications Administered in the ED   aspirin chewable tablet 324 mg (0 mg Oral Not Given 12/09/21 0600)   ciprofloxacin (CILOXIN) 0.3% ophthalmic solution (1 Drop Left Eye Given 12/09/21 0752)   hydrALAZINE (APRESOLINE)  injection 20 mg (20 mg Intravenous Given 12/09/21 0752)        Current Discharge Medication List        START taking these medications.        Details   ciprofloxacin HCl 0.3 % Drops  Commonly known as: CILOXIN   1 Drop, Left Eye, EVERY 6 HOURS  Qty: 5 mL  Refills: 0            CONTINUE these medications - NO CHANGES were made during your visit.        Details   acetaminophen 325 mg Tablet  Commonly known as: TYLENOL   650 mg, Oral, EVERY 4 HOURS PRN  Refills: 0     amLODIPine 10 mg Tablet  Commonly known as: NORVASC   10 mg, Oral, DAILY, Take one tablet every day  Refills: 0     budesonide-formoteroL 160-4.5 mcg/actuation oral inhaler  Commonly known as: SYMBICORT   2 Puffs, Inhalation, 2 TIMES DAILY  Refills: 0     calcitrioL 0.5 mcg Capsule  Commonly known as: ROCALTROL   1 mcg, Oral, EVERY MO, WE AND FR  Refills: 0     cinacalcet 30 mg Tablet  Commonly known as: SENSIPAR   30 mg, Oral, EVERY EVENING  Refills: 0     cloNIDine 0.3 mg/24 hr Patch Weekly  Commonly known as: CATAPRES-TTS   0.3 mg, Transdermal, EVERY 7 DAYS  Refills: 0     cloNIDine HCL 0.2 mg Tablet  Commonly known as: CATAPRES   0.2 mg, Oral, 2 TIMES DAILY, Take one tablet twice a day  Refills: 0     clopidogreL 75 mg Tablet  Commonly known as: PLAVIX   75 mg, Oral, DAILY  Refills: 0     cyclobenzaprine 5 mg Tablet  Commonly known as: FLEXERIL   5 mg, Oral, 2 TIMES DAILY PRN  Refills: 0     doxazosin 4 mg Tablet  Commonly known as: CARDURA   4 mg, Oral, EVERY EVENING  Refills: 0     famotidine 40 mg Tablet  Commonly known as: PEPCID   40 mg, Oral, DAILY  Refills: 0     furosemide 80 mg Tablet  Commonly known as: LASIX   160 mg, Oral, DAILY, Take after dialysis on dialysis days; otherwise take in morning daily.   Refills: 0     hydrALAZINE 100 mg Tablet  Commonly known as: APRESOLINE   100 mg, Oral, EVERY 8 HOURS (SCHEDULED)  Qty: 90 Tablet  Refills: 0     ipratropium-albuteroL 0.5 mg-3 mg(2.5 mg base)/3 mL nebulizer solution  Commonly known as:  DUONEB   3 mL, Nebulization, 4 TIMES DAILY PRN  Refills: 0     Isosorbide Mononitrate 120 mg Tablet Sustained Release 24 hr  Commonly known as: IMDUR   120 mg, Oral, EVERY MORNING  Qty: 30 Tablet  Refills: 0     labetaloL 300 mg Tablet  Commonly known as: NORMODYNE   300 mg, Oral, 2 TIMES DAILY  Refills: 0     montelukast 10 mg Tablet  Commonly known  as: SINGULAIR   10 mg, Oral, EVERY EVENING  Refills: 0     nitroGLYCERIN 0.4 mg Tablet, Sublingual  Commonly known as: NITROSTAT   0.4 mg, Sublingual, EVERY 5 MIN PRN, for 3 doses over 15 minutes  Qty: 3 Tablet  Refills: 0     ondansetron 4 mg Tablet, Rapid Dissolve  Commonly known as: ZOFRAN ODT   4 mg, Oral, EVERY 8 HOURS PRN  Qty: 12 Tablet  Refills: 0     pantoprazole 40 mg Tablet, Delayed Release (E.C.)  Commonly known as: PROTONIX   40 mg, Oral, DAILY  Refills: 0     rosuvastatin 40 mg Tablet  Commonly known as: CRESTOR   40 mg, Oral, EVERY EVENING, Take one tablet every day  Refills: 0     traMADoL 50 mg Tablet  Commonly known as: ULTRAM   50 mg, Oral, EVERY 6 HOURS PRN  Qty: 20 Tablet  Refills: 0            ASK your doctor about these medications.        Details   calcium acetate(phosphat bind) 667 mg Capsule  Commonly known as: PHOSLO  Ask about: Should I take this medication?   1,334 mg, Oral, 3 TIMES DAILY WITH MEALS  Qty: 180 Capsule  Refills: 0     epoetin alfa-epbx 10,000 unit/mL Solution  Commonly known as: RETACRIT  Ask about: Should I take this medication?   10,000 Units, Intravenous, GIVE IN DIALYSIS  Refills: 0

## 2021-12-09 NOTE — ED Nurses Note (Signed)
Dr Nelson Chimes in room and examining patients left eye

## 2021-12-09 NOTE — ED Triage Notes (Addendum)
Per EMS, patient c/o chest pain for the past 3 hours. Patient reports respiratory illness with a cough. Chest pain is worse with cough. Pt took 324 mg ASA prior to EMS arrival. Describes as tightness.

## 2021-12-16 ENCOUNTER — Other Ambulatory Visit: Payer: Self-pay

## 2021-12-16 ENCOUNTER — Emergency Department (HOSPITAL_BASED_OUTPATIENT_CLINIC_OR_DEPARTMENT_OTHER): Payer: Medicare Other

## 2021-12-16 ENCOUNTER — Encounter (HOSPITAL_BASED_OUTPATIENT_CLINIC_OR_DEPARTMENT_OTHER): Payer: Self-pay

## 2021-12-16 ENCOUNTER — Inpatient Hospital Stay
Admission: EM | Admit: 2021-12-16 | Discharge: 2021-12-17 | DRG: 280 | Discharge status: Against Medical Advice | Payer: Medicare Other | Attending: Critical Care Medicine | Admitting: Critical Care Medicine

## 2021-12-16 DIAGNOSIS — Z9049 Acquired absence of other specified parts of digestive tract: Secondary | ICD-10-CM

## 2021-12-16 DIAGNOSIS — I259 Chronic ischemic heart disease, unspecified: Secondary | ICD-10-CM

## 2021-12-16 DIAGNOSIS — I081 Rheumatic disorders of both mitral and tricuspid valves: Secondary | ICD-10-CM | POA: Diagnosis present

## 2021-12-16 DIAGNOSIS — Z8249 Family history of ischemic heart disease and other diseases of the circulatory system: Secondary | ICD-10-CM

## 2021-12-16 DIAGNOSIS — Z87891 Personal history of nicotine dependence: Secondary | ICD-10-CM

## 2021-12-16 DIAGNOSIS — D631 Anemia in chronic kidney disease: Secondary | ICD-10-CM | POA: Diagnosis present

## 2021-12-16 DIAGNOSIS — T465X6A Underdosing of other antihypertensive drugs, initial encounter: Secondary | ICD-10-CM | POA: Diagnosis present

## 2021-12-16 DIAGNOSIS — Z992 Dependence on renal dialysis: Secondary | ICD-10-CM

## 2021-12-16 DIAGNOSIS — R9431 Abnormal electrocardiogram [ECG] [EKG]: Secondary | ICD-10-CM

## 2021-12-16 DIAGNOSIS — Z86718 Personal history of other venous thrombosis and embolism: Secondary | ICD-10-CM

## 2021-12-16 DIAGNOSIS — E7800 Pure hypercholesterolemia, unspecified: Secondary | ICD-10-CM | POA: Diagnosis present

## 2021-12-16 DIAGNOSIS — Z91128 Patient's intentional underdosing of medication regimen for other reason: Secondary | ICD-10-CM

## 2021-12-16 DIAGNOSIS — R7989 Other specified abnormal findings of blood chemistry: Secondary | ICD-10-CM

## 2021-12-16 DIAGNOSIS — I120 Hypertensive chronic kidney disease with stage 5 chronic kidney disease or end stage renal disease: Secondary | ICD-10-CM

## 2021-12-16 DIAGNOSIS — Z9981 Dependence on supplemental oxygen: Secondary | ICD-10-CM

## 2021-12-16 DIAGNOSIS — I160 Hypertensive urgency: Secondary | ICD-10-CM | POA: Diagnosis present

## 2021-12-16 DIAGNOSIS — Z90710 Acquired absence of both cervix and uterus: Secondary | ICD-10-CM

## 2021-12-16 DIAGNOSIS — I252 Old myocardial infarction: Secondary | ICD-10-CM

## 2021-12-16 DIAGNOSIS — I214 Non-ST elevation (NSTEMI) myocardial infarction: Principal | ICD-10-CM | POA: Diagnosis present

## 2021-12-16 DIAGNOSIS — I132 Hypertensive heart and chronic kidney disease with heart failure and with stage 5 chronic kidney disease, or end stage renal disease: Secondary | ICD-10-CM | POA: Diagnosis present

## 2021-12-16 DIAGNOSIS — Z9861 Coronary angioplasty status: Secondary | ICD-10-CM

## 2021-12-16 DIAGNOSIS — N186 End stage renal disease: Secondary | ICD-10-CM

## 2021-12-16 DIAGNOSIS — J45909 Unspecified asthma, uncomplicated: Secondary | ICD-10-CM | POA: Diagnosis present

## 2021-12-16 DIAGNOSIS — I10 Essential (primary) hypertension: Principal | ICD-10-CM

## 2021-12-16 DIAGNOSIS — I5032 Chronic diastolic (congestive) heart failure: Secondary | ICD-10-CM | POA: Diagnosis present

## 2021-12-16 DIAGNOSIS — I161 Hypertensive emergency: Secondary | ICD-10-CM | POA: Diagnosis present

## 2021-12-16 DIAGNOSIS — I2510 Atherosclerotic heart disease of native coronary artery without angina pectoris: Secondary | ICD-10-CM | POA: Diagnosis present

## 2021-12-16 LAB — ECG 12 LEAD
Atrial Rate: 87 {beats}/min
Calculated P Axis: 76 degrees
Calculated R Axis: 7 degrees
Calculated T Axis: 87 degrees
PR Interval: 198 ms
QRS Duration: 100 ms
QT Interval: 408 ms
QTC Calculation: 490 ms
Ventricular rate: 87 {beats}/min

## 2021-12-16 LAB — COMPREHENSIVE METABOLIC PANEL, NON-FASTING
ALBUMIN/GLOBULIN RATIO: 0.7 — ABNORMAL LOW (ref 0.8–1.4)
ALBUMIN: 3.5 g/dL (ref 3.4–5.0)
ALKALINE PHOSPHATASE: 92 U/L (ref 46–116)
ALT (SGPT): 12 U/L (ref <=78)
ANION GAP: 14 mmol/L — ABNORMAL HIGH (ref 4–13)
AST (SGOT): 23 U/L (ref 15–37)
BILIRUBIN TOTAL: 0.7 mg/dL (ref 0.2–1.0)
BUN/CREA RATIO: 3
BUN: 39 mg/dL — ABNORMAL HIGH (ref 7–18)
CALCIUM, CORRECTED: 10.5 mg/dL
CALCIUM: 10 mg/dL (ref 8.5–10.1)
CHLORIDE: 94 mmol/L — ABNORMAL LOW (ref 98–107)
CO2 TOTAL: 26 mmol/L (ref 21–32)
CREATININE: 12.09 mg/dL — ABNORMAL HIGH (ref 0.55–1.02)
ESTIMATED GFR: 4 mL/min/{1.73_m2} — ABNORMAL LOW (ref >59)
GLOBULIN: 5.2
GLUCOSE: 96 mg/dL (ref 74–106)
OSMOLALITY, CALCULATED: 278 mOsm/kg (ref 270–290)
POTASSIUM: 4.7 mmol/L (ref 3.5–5.1)
PROTEIN TOTAL: 8.7 g/dL — ABNORMAL HIGH (ref 6.4–8.2)
SODIUM: 134 mmol/L — ABNORMAL LOW (ref 136–145)

## 2021-12-16 LAB — CBC WITH DIFF
BASOPHIL #: 0.02 10*3/uL (ref 0.00–0.30)
BASOPHIL %: 0 % (ref 0–3)
EOSINOPHIL #: 0.09 10*3/uL (ref 0.00–0.80)
EOSINOPHIL %: 1 % (ref 0–7)
HCT: 31.2 % — ABNORMAL LOW (ref 37.0–47.0)
HGB: 9.9 g/dL — ABNORMAL LOW (ref 12.5–16.0)
LYMPHOCYTE #: 0.47 10*3/uL — ABNORMAL LOW (ref 1.10–5.00)
LYMPHOCYTE %: 4 % — ABNORMAL LOW (ref 25–45)
MCH: 28.9 pg (ref 27.0–32.0)
MCHC: 31.6 g/dL — ABNORMAL LOW (ref 32.0–36.0)
MCV: 91.4 fL (ref 78.0–99.0)
MONOCYTE #: 0.34 10*3/uL (ref 0.00–1.30)
MONOCYTE %: 3 % (ref 0–12)
MPV: 7.8 fL (ref 7.4–10.4)
NEUTROPHIL #: 11.26 10*3/uL — ABNORMAL HIGH (ref 1.80–8.40)
NEUTROPHIL %: 92 % — ABNORMAL HIGH (ref 40–76)
PLATELETS: 316 10*3/uL (ref 140–440)
RBC: 3.41 10*6/uL — ABNORMAL LOW (ref 4.20–5.40)
RDW: 19.6 % — ABNORMAL HIGH (ref 11.6–14.8)
WBC: 12.2 10*3/uL — ABNORMAL HIGH (ref 4.0–10.5)

## 2021-12-16 LAB — PT/INR
INR: 1.13 — ABNORMAL HIGH (ref 0.88–1.10)
INR: 1.16 (ref <=5.00)
PROTHROMBIN TIME: 13.1 seconds — ABNORMAL HIGH (ref 9.8–12.7)
PROTHROMBIN TIME: 13.4 seconds — ABNORMAL HIGH (ref 9.8–12.7)

## 2021-12-16 LAB — PTT (PARTIAL THROMBOPLASTIN TIME)
APTT: 63.1 seconds — ABNORMAL HIGH (ref 22.0–31.7)
APTT: 84.4 seconds — ABNORMAL HIGH (ref 26.0–36.0)

## 2021-12-16 LAB — TROPONIN-I
TROPONIN I: 91 ng/L — ABNORMAL HIGH (ref <15)
TROPONIN I: 87 ng/L — ABNORMAL HIGH (ref <15)
TROPONIN I: 78 ng/L — ABNORMAL HIGH (ref <15)

## 2021-12-16 LAB — MAGNESIUM: MAGNESIUM: 1.9 mg/dL (ref 1.8–2.4)

## 2021-12-16 MED ORDER — ALBUTEROL SULFATE 2.5 MG/3 ML (0.083 %) SOLUTION FOR NEBULIZATION
2.5000 mg | INHALATION_SOLUTION | RESPIRATORY_TRACT | Status: DC | PRN
Start: 2021-12-16 — End: 2021-12-17

## 2021-12-16 MED ORDER — ISOSORBIDE MONONITRATE ER 60 MG TABLET,EXTENDED RELEASE 24 HR
120.0000 mg | ORAL_TABLET | Freq: Every morning | ORAL | Status: DC
Start: 2021-12-17 — End: 2021-12-17
  Administered 2021-12-17: 120 mg via ORAL

## 2021-12-16 MED ORDER — ATORVASTATIN 40 MG TABLET
ORAL_TABLET | ORAL | Status: AC
Start: 2021-12-16 — End: 2021-12-16
  Filled 2021-12-16: qty 1

## 2021-12-16 MED ORDER — SODIUM CHLORIDE 0.9 % IV BOLUS
500.0000 mL | INJECTION | Status: AC
Start: 2021-12-16 — End: 2021-12-16
  Administered 2021-12-16: 0 mL via INTRAVENOUS
  Administered 2021-12-16: 500 mL via INTRAVENOUS

## 2021-12-16 MED ORDER — PANTOPRAZOLE 40 MG TABLET,DELAYED RELEASE
40.0000 mg | DELAYED_RELEASE_TABLET | Freq: Every day | ORAL | Status: DC
Start: 2021-12-16 — End: 2021-12-17
  Administered 2021-12-16: 40 mg via ORAL

## 2021-12-16 MED ORDER — FAMOTIDINE 20 MG TABLET
ORAL_TABLET | ORAL | Status: AC
Start: 2021-12-16 — End: 2021-12-16
  Filled 2021-12-16: qty 2

## 2021-12-16 MED ORDER — SODIUM CHLORIDE 0.9 % INTRAVENOUS SOLUTION
120.0000 mg | INTRAVENOUS | Status: AC
Start: 2021-12-16 — End: 2021-12-16
  Administered 2021-12-16: 120 mg via INTRAVENOUS
  Administered 2021-12-16: 0 mg via INTRAVENOUS
  Filled 2021-12-16: qty 12

## 2021-12-16 MED ORDER — ONDANSETRON HCL (PF) 4 MG/2 ML INJECTION SOLUTION
4.0000 mg | Freq: Four times a day (QID) | INTRAMUSCULAR | Status: DC | PRN
Start: 2021-12-16 — End: 2021-12-17
  Administered 2021-12-16: 4 mg via INTRAVENOUS
  Filled 2021-12-16: qty 2

## 2021-12-16 MED ORDER — FUROSEMIDE 40 MG TABLET
ORAL_TABLET | ORAL | Status: AC
Start: 2021-12-16 — End: 2021-12-16
  Filled 2021-12-16: qty 4

## 2021-12-16 MED ORDER — METOPROLOL TARTRATE 25 MG TABLET
ORAL_TABLET | ORAL | Status: AC
Start: 2021-12-16 — End: 2021-12-16
  Filled 2021-12-16: qty 1

## 2021-12-16 MED ORDER — MORPHINE 4 MG/ML INJECTION WRAPPER
4.0000 mg | INJECTION | INTRAMUSCULAR | Status: AC
Start: 2021-12-16 — End: 2021-12-16
  Administered 2021-12-16: 4 mg via INTRAVENOUS

## 2021-12-16 MED ORDER — HEPARIN (PORCINE) 5,000 UNIT/ML INJECTION SOLUTION
INTRAMUSCULAR | Status: AC
Start: 2021-12-16 — End: 2021-12-16
  Filled 2021-12-16: qty 1

## 2021-12-16 MED ORDER — HYDRALAZINE 20 MG/ML INJECTION SOLUTION
20.0000 mg | INTRAMUSCULAR | Status: AC
Start: 2021-12-16 — End: 2021-12-16
  Administered 2021-12-16: 20 mg via INTRAVENOUS

## 2021-12-16 MED ORDER — MONTELUKAST 10 MG TABLET
ORAL_TABLET | ORAL | Status: AC
Start: 2021-12-16 — End: 2021-12-16
  Filled 2021-12-16: qty 1

## 2021-12-16 MED ORDER — NITROGLYCERIN 50 MG/250 ML (200 MCG/ML) IN 5 % DEXTROSE INTRAVENOUS
INTRAVENOUS | Status: AC
Start: 2021-12-16 — End: 2021-12-16
  Filled 2021-12-16: qty 250

## 2021-12-16 MED ORDER — HYDROMORPHONE 2 MG/ML INJECTION WRAPPER
0.2000 mg | INJECTION | Freq: Once | INTRAMUSCULAR | Status: AC
Start: 2021-12-16 — End: 2021-12-16
  Administered 2021-12-16: 0.2 mg via INTRAVENOUS

## 2021-12-16 MED ORDER — MONTELUKAST 10 MG TABLET
10.0000 mg | ORAL_TABLET | Freq: Every evening | ORAL | Status: DC
Start: 2021-12-16 — End: 2021-12-17
  Administered 2021-12-16: 10 mg via ORAL

## 2021-12-16 MED ORDER — CLOPIDOGREL 75 MG TABLET
ORAL_TABLET | ORAL | Status: AC
Start: 2021-12-16 — End: 2021-12-16
  Filled 2021-12-16: qty 1

## 2021-12-16 MED ORDER — NITROGLYCERIN 0.4 MG SUBLINGUAL TABLET
0.4000 mg | SUBLINGUAL_TABLET | SUBLINGUAL | Status: AC
Start: 2021-12-16 — End: 2021-12-16
  Administered 2021-12-16: 0.4 mg via SUBLINGUAL

## 2021-12-16 MED ORDER — HYDRALAZINE 25 MG TABLET
100.0000 mg | ORAL_TABLET | Freq: Three times a day (TID) | ORAL | Status: DC
Start: 2021-12-16 — End: 2021-12-17
  Administered 2021-12-16 – 2021-12-17 (×2): 100 mg via ORAL

## 2021-12-16 MED ORDER — HYDRALAZINE 20 MG/ML INJECTION SOLUTION
INTRAMUSCULAR | Status: AC
Start: 2021-12-16 — End: 2021-12-16
  Filled 2021-12-16: qty 1

## 2021-12-16 MED ORDER — HEPARIN (PORCINE) 25,000 UNIT/250 ML IN 0.45 % SODIUM CHLORIDE IV SOLN
12.0000 [IU]/kg/h | INTRAVENOUS | Status: DC
Start: 2021-12-16 — End: 2021-12-17
  Administered 2021-12-16 (×2): 12 [IU]/kg/h via INTRAVENOUS
  Administered 2021-12-16: 0 [IU]/kg/h via INTRAVENOUS
  Administered 2021-12-16: 9 [IU]/kg/h via INTRAVENOUS
  Administered 2021-12-17: 6 [IU]/kg/h via INTRAVENOUS
  Administered 2021-12-17: 0 [IU]/kg/h via INTRAVENOUS

## 2021-12-16 MED ORDER — HYDRALAZINE 25 MG TABLET
ORAL_TABLET | ORAL | Status: AC
Start: 2021-12-16 — End: 2021-12-16
  Filled 2021-12-16: qty 4

## 2021-12-16 MED ORDER — NITROGLYCERIN 2 % TRANSDERMAL OINTMENT - PACKET
1.0000 [in_us] | TOPICAL_OINTMENT | TRANSDERMAL | Status: AC
Start: 2021-12-16 — End: 2021-12-16
  Administered 2021-12-16: 1 [in_us] via TOPICAL

## 2021-12-16 MED ORDER — DOXAZOSIN 4 MG TABLET
4.0000 mg | ORAL_TABLET | Freq: Every evening | ORAL | Status: DC
Start: 2021-12-16 — End: 2021-12-17
  Administered 2021-12-16: 4 mg via ORAL
  Filled 2021-12-16 (×2): qty 1

## 2021-12-16 MED ORDER — FUROSEMIDE 10 MG/ML INJECTION SOLUTION
INTRAMUSCULAR | Status: AC
Start: 2021-12-16 — End: 2021-12-16
  Filled 2021-12-16: qty 20

## 2021-12-16 MED ORDER — TRAMADOL 50 MG TABLET
50.0000 mg | ORAL_TABLET | Freq: Four times a day (QID) | ORAL | Status: DC | PRN
Start: 2021-12-16 — End: 2021-12-17
  Administered 2021-12-17: 50 mg via ORAL

## 2021-12-16 MED ORDER — IPRATROPIUM 0.5 MG-ALBUTEROL 3 MG (2.5 MG BASE)/3 ML NEBULIZATION SOLN
3.0000 mL | INHALATION_SOLUTION | Freq: Four times a day (QID) | RESPIRATORY_TRACT | Status: DC | PRN
Start: 2021-12-16 — End: 2021-12-17

## 2021-12-16 MED ORDER — CLONIDINE 0.2 MG/24 HR WEEKLY TRANSDERMAL PATCH
0.3000 mg | MEDICATED_PATCH | TRANSDERMAL | Status: DC
Start: 2021-12-20 — End: 2021-12-17

## 2021-12-16 MED ORDER — CLOPIDOGREL 75 MG TABLET
75.0000 mg | ORAL_TABLET | Freq: Every day | ORAL | Status: DC
Start: 2021-12-16 — End: 2021-12-17
  Administered 2021-12-16: 75 mg via ORAL

## 2021-12-16 MED ORDER — PANTOPRAZOLE 40 MG TABLET,DELAYED RELEASE
DELAYED_RELEASE_TABLET | ORAL | Status: AC
Start: 2021-12-16 — End: 2021-12-16
  Filled 2021-12-16: qty 1

## 2021-12-16 MED ORDER — DICYCLOMINE 20 MG TABLET
ORAL_TABLET | ORAL | Status: AC
Start: 2021-12-16 — End: 2021-12-16
  Filled 2021-12-16: qty 1

## 2021-12-16 MED ORDER — HEPARIN (PORCINE) 25,000 UNIT/250 ML IN 0.45 % SODIUM CHLORIDE IV SOLN
INTRAVENOUS | Status: AC
Start: 2021-12-16 — End: 2021-12-16
  Filled 2021-12-16: qty 250

## 2021-12-16 MED ORDER — AMLODIPINE 5 MG TABLET
ORAL_TABLET | ORAL | Status: AC
Start: 2021-12-16 — End: 2021-12-16
  Filled 2021-12-16: qty 2

## 2021-12-16 MED ORDER — LABETALOL 200 MG TABLET
300.0000 mg | ORAL_TABLET | Freq: Two times a day (BID) | ORAL | Status: DC
Start: 2021-12-16 — End: 2021-12-17
  Administered 2021-12-16: 300 mg via ORAL
  Filled 2021-12-16 (×4): qty 1.5

## 2021-12-16 MED ORDER — NITROGLYCERIN 0.4 MG SUBLINGUAL TABLET
SUBLINGUAL_TABLET | SUBLINGUAL | Status: AC
Start: 2021-12-16 — End: 2021-12-16
  Filled 2021-12-16: qty 3

## 2021-12-16 MED ORDER — BUDESONIDE-FORMOTEROL HFA 160 MCG-4.5 MCG/ACTUATION AEROSOL INHALER
2.0000 | INHALATION_SPRAY | Freq: Two times a day (BID) | RESPIRATORY_TRACT | Status: DC
Start: 2021-12-16 — End: 2021-12-17
  Administered 2021-12-16: 0 via RESPIRATORY_TRACT
  Administered 2021-12-17: 2 via RESPIRATORY_TRACT
  Filled 2021-12-16: qty 12

## 2021-12-16 MED ORDER — AMLODIPINE 5 MG TABLET
10.0000 mg | ORAL_TABLET | Freq: Every day | ORAL | Status: DC
Start: 2021-12-16 — End: 2021-12-17
  Administered 2021-12-16: 10 mg via ORAL

## 2021-12-16 MED ORDER — DICYCLOMINE 10 MG CAPSULE
20.0000 mg | ORAL_CAPSULE | ORAL | Status: AC
Start: 2021-12-16 — End: 2021-12-16
  Administered 2021-12-16: 20 mg via ORAL
  Filled 2021-12-16: qty 2

## 2021-12-16 MED ORDER — ACETAMINOPHEN 325 MG TABLET
650.0000 mg | ORAL_TABLET | ORAL | Status: DC | PRN
Start: 2021-12-16 — End: 2021-12-17

## 2021-12-16 MED ORDER — MIDODRINE 2.5 MG TABLET
5.0000 mg | ORAL_TABLET | Freq: Every day | ORAL | Status: DC | PRN
Start: 2021-12-16 — End: 2021-12-17

## 2021-12-16 MED ORDER — NITROGLYCERIN 50 MG/250 ML (200 MCG/ML) IN 5 % DEXTROSE INTRAVENOUS
5.0000 ug/min | INTRAVENOUS | Status: DC
Start: 2021-12-16 — End: 2021-12-17
  Administered 2021-12-16: 5 ug/min via INTRAVENOUS
  Administered 2021-12-17: 10 ug/min via INTRAVENOUS

## 2021-12-16 MED ORDER — FUROSEMIDE 40 MG TABLET
160.0000 mg | ORAL_TABLET | ORAL | Status: DC
Start: 2021-12-16 — End: 2021-12-17
  Administered 2021-12-16: 160 mg via ORAL

## 2021-12-16 MED ORDER — CINACALCET 30 MG TABLET
30.0000 mg | ORAL_TABLET | Freq: Every evening | ORAL | Status: DC
Start: 2021-12-16 — End: 2021-12-17
  Administered 2021-12-16: 30 mg via ORAL
  Filled 2021-12-16 (×2): qty 1

## 2021-12-16 MED ORDER — HEPARIN (PORCINE) 5,000 UNITS/ML BOLUS
60.0000 [IU]/kg | Freq: Once | INTRAMUSCULAR | Status: AC
Start: 2021-12-16 — End: 2021-12-16
  Administered 2021-12-16: 4500 [IU] via INTRAVENOUS

## 2021-12-16 MED ORDER — ASPIRIN 81 MG CHEWABLE TABLET
324.0000 mg | CHEWABLE_TABLET | ORAL | Status: DC
Start: 2021-12-16 — End: 2021-12-16

## 2021-12-16 MED ORDER — HEPARIN (PORCINE) 5,000 UNIT/ML INJECTION SOLUTION
5000.0000 [IU] | Freq: Three times a day (TID) | INTRAMUSCULAR | Status: DC
Start: 2021-12-16 — End: 2021-12-16

## 2021-12-16 MED ORDER — ATORVASTATIN 40 MG TABLET
40.0000 mg | ORAL_TABLET | Freq: Every evening | ORAL | Status: DC
Start: 2021-12-16 — End: 2021-12-17
  Administered 2021-12-16: 40 mg via ORAL

## 2021-12-16 MED ORDER — CYCLOBENZAPRINE 10 MG TABLET
5.0000 mg | ORAL_TABLET | Freq: Two times a day (BID) | ORAL | Status: DC | PRN
Start: 2021-12-16 — End: 2021-12-17

## 2021-12-16 MED ORDER — HYDROMORPHONE 2 MG/ML INJECTION WRAPPER
INJECTION | INTRAMUSCULAR | Status: AC
Start: 2021-12-16 — End: 2021-12-16
  Filled 2021-12-16: qty 1

## 2021-12-16 MED ORDER — CALCIUM ACETATE(PHOSPHATE BINDERS) 667 MG CAPSULE
1334.0000 mg | ORAL_CAPSULE | Freq: Three times a day (TID) | ORAL | Status: DC
Start: 2021-12-17 — End: 2021-12-17
  Filled 2021-12-16 (×3): qty 2

## 2021-12-16 MED ORDER — MORPHINE 4 MG/ML INJECTION WRAPPER
INJECTION | INTRAMUSCULAR | Status: AC
Start: 2021-12-16 — End: 2021-12-16
  Filled 2021-12-16: qty 1

## 2021-12-16 MED ORDER — NITROGLYCERIN 2 % TRANSDERMAL OINTMENT - PACKET
TOPICAL_OINTMENT | TRANSDERMAL | Status: AC
Start: 2021-12-16 — End: 2021-12-16
  Filled 2021-12-16: qty 1

## 2021-12-16 MED ORDER — FAMOTIDINE 20 MG TABLET
40.0000 mg | ORAL_TABLET | Freq: Every day | ORAL | Status: DC
Start: 2021-12-16 — End: 2021-12-17
  Administered 2021-12-16: 40 mg via ORAL

## 2021-12-16 MED ORDER — EPOETIN ALFA-EPBX 10,000 UNIT/ML INJECTION SOLUTION
10000.0000 [IU] | INTRAMUSCULAR | Status: DC
Start: 2021-12-18 — End: 2021-12-17
  Filled 2021-12-16: qty 1

## 2021-12-16 MED ORDER — CALCITRIOL 0.25 MCG CAPSULE
1.0000 ug | ORAL_CAPSULE | ORAL | Status: DC
Start: 2021-12-18 — End: 2021-12-17
  Filled 2021-12-16: qty 4

## 2021-12-16 NOTE — ED APP Handoff Note (Signed)
[facility]  Emergency Department  Course Note    Care/report received from [facility] at  14:48.  Per report:  [name] is a 46 y.o. female who had concerns including Chest Pain .     Pending labs/imaging/consults:  No pending labs.  Plan:  Patient's creatinine was 12 Dr. Monia Pouch notified patient now in [facility] emergency department.    Course:  Hospitalist was consulted then intensivist was consulted now awaiting on interventional cardiology to return call.  Spoke with Dr. Leonides Schanz interventional cardiologist on call he states patient does not need to be transferred at this time we will continue to monitor her with medical management.  Return call to Dr. Flavia Shipper the intensivist on to admit patient to critical care unit.     Patient will be admitted to the  service for further workup and management.    Disposition: Admitted    Clinical Impression   Accelerated hypertension (Primary)   Non-STEMI (non-ST elevated myocardial infarction) (CMS HCC)   End-stage renal disease (ESRD) (CMS HCC)   Renal azotemia         Sheppard Coil, APRN

## 2021-12-16 NOTE — ED Nurses Note (Signed)
RECEIVED FROM [facility] ED @ THIS TIME. AWAITING BED PLACEMENT.

## 2021-12-16 NOTE — H&P (Signed)
[facility]  IP H&P NOTE      Date of Admission:  12/16/2021  Hospital Day:  LOS: 0 days       Subjective:   46 year old well known to service due to multiple admissions.  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  Notes that it is her typical chest pain that she gets.  She is been evaluated numerous times in the past by Cardiology including catheterizations.  At this time it is felt that she needs medical management.  She did appear to have ST segment changes in her anterior lateral leads.  Her troponins were similar to what they have been previously.  I asked the ED staff to reach out to Interventional Cardiology with regards to if any intervention needed to be done for her EKG changes.  Emergency staff reach out to me and let me know that they had spoken with Interventional Cardiology, Dr Leonides Schanz with response that patient did not need transfer in would be taken care of medically.  Upon transfer from [facility] emergency department to [facility] Emergency Department she was noted to be hypertensive.  She was placed on a nitroglycerin drip.  With regards to her blood pressure she notes that she is been taking all her medications except that she has run out of her clonidine patch.  She states the pharmacy has not gotten any ischemia and therefore she is been out of it.    Vitals:  Temp (24hrs) Max:36.9 C (98.4 F)      Temperature: 36.5 C (97.7 F)  BP (Non-Invasive): (!) 202/168  MAP (Non-Invasive): 177 mmHG  Heart Rate: 87  Respiratory Rate: 14  SpO2: 99 %    I/O:  I/O last 24 hours:    Intake/Output Summary (Last 24 hours) at 12/16/2021 1715  Last data filed at 12/16/2021 1200  Gross per 24 hour   Intake 500 ml   Output --   Net 500 ml       PE:  General: NAD  HEENT:  no thrush, no scleral icterus, no conjunctival pallor  Cardiovascular:  Regular rate, normal S1  and S2, 2/6 murmurs or extra heart sounds, 2+ radial pulses B/L,  Respiratory:  No increased work of breathing, good air movement throughout, no wheezes or crackles  Abdomen: Soft, non-tender, non-distended  Extremities: No lower extremity edema  Skin: Warm, no rashes  Neurologic: Symmetric gaze, no facial asymmetry, interactive      Current Meds:  heparin 25,000 units in 0.45% NS 250 mL infusion, 12 Units/kg/hr (Adjusted), Intravenous, Continuous  midodrine (PROAMITINE) tablet, 5 mg, Oral, DIALYSIS DAILY PRN  nitroGLYCERIN 50 mg in 250 ml D5W premix infusion, 5 mcg/min, Intravenous, Continuous        Labs:  Reviewed:   Results for orders placed or performed during the hospital encounter of 12/16/21 (from the past 24 hour(s))   CBC/DIFF    Narrative    The following orders were created for panel order CBC/DIFF.  Procedure                               Abnormality         Status                     ---------                               -----------         ------  CBC WITH ZMOQ[[id number]]                Abnormal            Final result                 Please view results for these tests on the individual orders.   COMPREHENSIVE METABOLIC PANEL, NON-FASTING   Result Value Ref Range    SODIUM 134 (L) 136 - 145 mmol/L    POTASSIUM 4.7 3.5 - 5.1 mmol/L    CHLORIDE 94 (L) 98 - 107 mmol/L    CO2 TOTAL 26 21 - 32 mmol/L    ANION GAP 14 (H) 4 - 13 mmol/L    BUN 39 (H) 7 - 18 mg/dL    CREATININE 12.09 (H) 0.55 - 1.02 mg/dL    BUN/CREA RATIO 3     ESTIMATED GFR 4 (L) >59 mL/min/1.28m2    ALBUMIN 3.5 3.4 - 5.0 g/dL    CALCIUM 10.0 8.5 - 10.1 mg/dL    GLUCOSE 96 74 - 106 mg/dL    ALKALINE PHOSPHATASE 92 46 - 116 U/L    ALT (SGPT) 12 <=78 U/L    AST (SGOT) 23 15 - 37 U/L    BILIRUBIN TOTAL 0.7 0.2 - 1.0 mg/dL    PROTEIN TOTAL 8.7 (H) 6.4 - 8.2 g/dL    ALBUMIN/GLOBULIN RATIO 0.7 (L) 0.8 - 1.4    OSMOLALITY, CALCULATED 278 270 - 290 mOsm/kg    CALCIUM, CORRECTED 10.5 mg/dL    GLOBULIN 5.2     Narrative     Estimated Glomerular Filtration Rate (eGFR) is calculated using the CKD-EPI (2021) equation, intended for patients 172years of age and older. If gender is not documented or "unknown", there will be no eGFR calculation.   TROPONIN-I NOW   Result Value Ref Range    TROPONIN I 91 (H) <15 ng/L    Narrative    Values received on females ranging between 12-15 ng/L MUST include the next serial troponin to review changes in the delta differences as the reference range for the Access II chemistry analyzer is lower than the established reference range.     TROPONIN-I IN ONE HOUR   Result Value Ref Range    TROPONIN I 87 (H) <15 ng/L    Narrative    Values received on females ranging between 12-15 ng/L MUST include the next serial troponin to review changes in the delta differences as the reference range for the Access II chemistry analyzer is lower than the established reference range.     TROPONIN-I IN THREE HOURS   Result Value Ref Range    TROPONIN I 78 (H) <15 ng/L    Narrative    Values received on females ranging between 12-15 ng/L MUST include the next serial troponin to review changes in the delta differences as the reference range for the Access II chemistry analyzer is lower than the established reference range.     MAGNESIUM   Result Value Ref Range    MAGNESIUM 1.9 1.8 - 2.4 mg/dL   PT/INR   Result Value Ref Range    PROTHROMBIN TIME 13.1 (H) 9.8 - 12.7 seconds    INR 1.13 (H) 0.88 - 1.10    Narrative    INR OF 2.0-3.0  RECOMMENDED FOR: PROPHYLAXIS/TREATMENT OF VENEOUS THROMBOSIS, PULMONARY EMBOLISM, PREVENTION OF SYSTEMIC EMBOLISM FROM ATRIAL FIBRILATION, MYOCARDIAL INFARCTION.    INR OF 2.5-3.5  RECOMMENDED FOR MECHANICAL PROSTHETIC HEART VALVES, RECURRENT SYSTEMIC EMBOLISM,  RECURRENT MYOCARDIAL INFARCTION.     PTT (PARTIAL THROMBOPLASTIN TIME)   Result Value Ref Range    APTT 63.1 (H) 22.0 - 31.7 seconds   URINALYSIS WITH REFLEX MICROSCOPIC AND CULTURE IF POSITIVE    Specimen: Urine, Site not specified     Narrative    The following orders were created for panel order URINALYSIS WITH REFLEX MICROSCOPIC AND CULTURE IF POSITIVE.  Procedure                               Abnormality         Status                     ---------                               -----------         ------                     URINALYSIS, MACRO/MICRO[[id number]]                                                       Please view results for these tests on the individual orders.   CBC WITH DIFF   Result Value Ref Range    WBC 12.2 (H) 4.0 - 10.5 x10^3/uL    RBC 3.41 (L) 4.20 - 5.40 x10^6/uL    HGB 9.9 (L) 12.5 - 16.0 g/dL    HCT 31.2 (L) 37.0 - 47.0 %    MCV 91.4 78.0 - 99.0 fL    MCH 28.9 27.0 - 32.0 pg    MCHC 31.6 (L) 32.0 - 36.0 g/dL    RDW 19.6 (H) 11.6 - 14.8 %    PLATELETS 316 140 - 440 x10^3/uL    MPV 7.8 7.4 - 10.4 fL    NEUTROPHIL % 92 (H) 40 - 76 %    LYMPHOCYTE % 4 (L) 25 - 45 %    MONOCYTE % 3 0 - 12 %    EOSINOPHIL % 1 0 - 7 %    BASOPHIL % 0 0 - 3 %    NEUTROPHIL # 11.26 (H) 1.80 - 8.40 x10^3/uL    LYMPHOCYTE # 0.47 (L) 1.10 - 5.00 x10^3/uL    MONOCYTE # 0.34 0.00 - 1.30 x10^3/uL    EOSINOPHIL # 0.09 0.00 - 0.80 x10^3/uL    BASOPHIL # 0.02 0.00 - 0.30 x10^3/uL          Radiology:  Reviewed:   Results for orders placed or performed during the hospital encounter of 12/16/21 (from the past 24 hour(s))   XR AP MOBILE CHEST     Status: None    Narrative    Mena Winnick    RADIOLOGIST: Berton Bon, MD    XR AP MOBILE CHEST performed on 12/16/2021 8:07 AM    CLINICAL HISTORY: Chest Pain.  CP w/SOB    TECHNIQUE: Frontal view of the chest.    COMPARISON:  12/09/2021    FINDINGS:  Right central venous catheter in the SVC   Heart size is mildly enlarged.     There are increased perihilar markings  Impression    Cardiomegaly with mild central vascular congestion similar to the prior exam      Radiologist location ID: [id number]             Problem List:  [facility] Problems   (*Primary Problem)    Diagnosis    Non-STEMI (non-ST  elevated myocardial infarction) (CMS HCC)    Hypertensive emergency    End-stage renal disease (ESRD) (CMS HCC)       A/P:  Hypertensive urgency   Chest pain  History of coronary artery disease   End-stage renal disease    Patient has been started on nitroglycerin drip  Will resume outpatient oral antihypertensives  Will resume clonidine patch  Can give p.o. clonidine p.r.n. in the meantime as well  There is the potential that she is having rebound hypertension from the lack of alpha-blocker and not taking her clonidine patch.  Dr. Liliane Shi has seen patient and plans dialysis  Per ED staff cardiology recommends medical management of chest pain and coronary artery disease.  Consultation note is not available in system as yet.      IS PATIENT CRITICALLY ILL?  Is there a high potential of sudden, clinically significant, or life threatening deterioration? yes  Is there a need for direct personal assessment and management to treat/prevent multiple vital organ failure/deterioration?  yes  If this patient is not critically ill, I certify the patient requires continued in-patient hospitalization         I personally performed 32 min of aggregate critical care time exclusive of procedures and teaching.  This includes time spent during direct patient evaluation and reassessment, interpreting diagnostic tests, directing life and/or organ supporting interventions and documentation on the unit.

## 2021-12-16 NOTE — ED Nurses Note (Signed)
Patient transported to [facility] ED via [facility]. IV infusing on transport. Report called.

## 2021-12-16 NOTE — ED Nurses Note (Signed)
Patient states she is having abdominal cramping. Denies nausea. States pain feels like period cramps. Medicated per order in [facility]. Patient denies further needs at this time. Awaiting transport.

## 2021-12-16 NOTE — ED Triage Notes (Addendum)
Pt complains of chest pain onset 10-12 hrs ago. Took a total of 648  mg aspirins within that period and also 3 nitroglycerin. Denies any shortness of breath, is nausea. Pain under the left breast radiating to the left arm.

## 2021-12-16 NOTE — Consults (Signed)
[place]    NEPHROLOGY CONSULTATION NOTE       [name] 46 y.o. female ED29/ED29   Date of Service: 12/16/2021    Date of Admission:  12/16/2021   PCP: Cam Hai, FNP Code Status:Prior       Reason for Consultation:  End-stage renal disease on hemodialysis     HPI:   46 year old female with past medical history of end-stage renal disease on hemodialysis Monday Wednesday and Friday who has been complaining of chest pain on and off yesterday was getting worse decided come to the hospital for further evaluation.  Patient was in the emergency room in [place] was contacted patient had elevation in her troponin her blood pressure was very high and her chest x-ray indicated vascular congestion.  Patient is due for dialysis today requesting transferring the patient to our facility start heparin drip and arrange for dialysis as soon as possible.  When I went to evaluate the patient she reports improvement in her chest pain.  She reports the chest pain is worse when she is lying and it happens on rest.  Patient's blood pressure has been elevated she isn't taking blood pressure medication this morning.    Patient denies edema reports mild shortness of breath.      ROS:   Systematic review of 12 organ systems was negative except what mentioned in in the HPI.    ED medications:   Medications Administered in the ED   heparin 25,000 units in 0.45% NS 250 mL infusion (12 Units/kg/hr  72.3 kg (Adjusted) Intravenous New Bag/New Syringe 12/16/21 1103)   NS bolus infusion 500 mL (0 mL Intravenous Stopped 12/16/21 1200)   nitroGLYCERIN (NITROSTAT) sublingual tablet (0.4 mg Sublingual Given 12/16/21 0759)   nitroGLYCERIN (NITRO-BID) 2 % topical ointment (1 Inch Apply Topically Given 12/16/21 0759)   furosemide (LASIX) 120 mg in NS 100 mL IVPB (0 mg Intravenous Stopped 12/16/21 1120)   hydrALAZINE (APRESOLINE) injection 20 mg (20 mg Intravenous Given 12/16/21 0904)   heparin 5,000  units/mL initial IV BOLUS (4,500 Units Intravenous Given 12/16/21 1102)   hydrALAZINE (APRESOLINE) injection 20 mg (20 mg Intravenous Given 12/16/21 1047)   dicyclomine (BENTYL) capsule (20 mg Oral Given 12/16/21 1200)   morphine 4 mg/mL injection (4 mg Intravenous Given 12/16/21 1158)       PMHx:    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    Tricuspid valve regurgitation     Uncontrolled hypertension         PSHx:   Past Surgical History:   Procedure Laterality Date    CORONARY ARTERY ANGIOPLASTY      ESOPHAGOGASTRODUODENOSCOPY      HX BACK SURGERY      HX CHOLECYSTECTOMY      HX FOOT SURGERY Right     HX HYSTERECTOMY      HX TONSILLECTOMY  Allergies:    Allergies   Allergen Reactions    Lisinopril Swelling     Tongue and throat swelling    Cardene [Nicardipine]  Other Adverse Reaction (Add comment)     Chest pain     Oxycodone Itching    Social History  Social History     Tobacco Use    Passive exposure: Past    Smokeless tobacco: Former    Tobacco comments:     "Haven't had a cigerette in 2 weeks."   Vaping Use    Vaping Use: Never used   Substance Use Topics    Alcohol use: Not Currently    Drug use: Yes     Types: Marijuana       Family History  Family Medical History:       Problem Relation (Age of Onset)    Breast Cancer Mother    Coronary Artery Disease Mother    Diabetes type II Mother    Hypertension (High Blood Pressure) Father               Home Meds:      Prior to Admission medications    Medication Sig Start Date End Date Taking? Authorizing Provider   acetaminophen (TYLENOL)  325 mg Oral Tablet Take 2 Tablets (650 mg total) by mouth Every 4 hours as needed 08/19/21   Scudiere, Loa Socks, APRN   amLODIPine (NORVASC) 10 mg Oral Tablet Take 1 Tablet (10 mg total) by mouth Once a day Take one tablet every day    Provider, Historical   budesonide-formoteroL (SYMBICORT) 160-4.5 mcg/actuation Inhalation oral inhaler Take 2 Puffs by inhalation Twice daily    Provider, Historical   calcitrioL (ROCALTROL) 0.5 mcg Oral Capsule Take 2 Capsules (1 mcg total) by mouth Every Monday, Wednesday and Friday    Provider, Historical   calcium acetate,phosphat bind, (PHOSLO) 667 mg Oral Capsule Take 2 Capsules (1,334 mg total) by mouth Three times daily with meals for 30 days 10/21/21 11/20/21  Carron Brazen, DO   cinacalcet (SENSIPAR) 30 mg Oral Tablet Take 1 Tablet (30 mg total) by mouth Every evening    Provider, Historical   ciprofloxacin HCl (CILOXIN) 0.3 % Ophthalmic Drops Administer 1 Drop into the left eye Every 6 hours for 5 days 12/09/21 12/14/21  Eustace Quail, MD   cloNIDine (CATAPRES-TTS) 0.3 mg/24 hr Transdermal Patch Weekly Place 1 Patch (0.3 mg total) on the skin Every 7 days 08/02/21   Provider, Historical   cloNIDine HCL (CATAPRES) 0.2 mg Oral Tablet Take 1 Tablet (0.2 mg total) by mouth Twice daily Take one tablet twice a day    Provider, Historical   clopidogreL (PLAVIX) 75 mg Oral Tablet Take 1 Tablet (75 mg total) by mouth Once a day    Provider, Historical   cyclobenzaprine (FLEXERIL) 5 mg Oral Tablet Take 1 Tablet (5 mg total) by mouth Twice per day as needed for Muscle spasms    Provider, Historical   doxazosin (CARDURA) 4 mg Oral Tablet Take 1 Tablet (4 mg total) by mouth Every evening    Provider, Historical   epoetin alfa-epbx (RETACRIT) 10,000 unit/mL Injection Solution Infuse 1 mL (10,000 Units total) into a venous catheter Give in Dialysis for 30 days 10/21/21 11/20/21  Carron Brazen, DO   famotidine (PEPCID) 40 mg Oral Tablet Take 1 Tablet (40 mg total) by mouth Once a day     Provider, Historical   furosemide (LASIX) 80 mg Oral Tablet Take 2 Tablets (160 mg  total) by mouth Once a day Take after dialysis on dialysis days; otherwise take in morning daily.    Provider, Historical   hydrALAZINE (APRESOLINE) 100 mg Oral Tablet Take 1 Tablet (100 mg total) by mouth Every 8 hours for 30 days 10/21/21 11/20/21  Carron Brazen, DO   ipratropium-albuterol 0.5 mg-3 mg(2.5 mg base)/3 mL Solution for Nebulization Take 3 mL by nebulization Four times a day as needed 10/03/21   Sherol Dade, PA-C   isosorbide mononitrate (IMDUR) 120 mg Oral Tablet Sustained Release 24 hr Take 1 Tablet (120 mg total) by mouth Every morning for 30 days 10/22/21 11/21/21  Carron Brazen, DO   labetaloL (NORMODYNE) 300 mg Oral Tablet Take 1 Tablet (300 mg total) by mouth Twice daily 10/03/21   Sherol Dade, PA-C   montelukast (SINGULAIR) 10 mg Oral Tablet Take 1 Tablet (10 mg total) by mouth Every evening    Provider, Historical   nitroGLYCERIN (NITROSTAT) 0.4 mg Sublingual Tablet, Sublingual Place 1 Tablet (0.4 mg total) under the tongue Every 5 minutes as needed for Chest pain for up to 3 doses for 3 doses over 15 minutes 10/25/21   Bretta Bang, DO   ondansetron (ZOFRAN ODT) 4 mg Oral Tablet, Rapid Dissolve Take 1 Tablet (4 mg total) by mouth Every 8 hours as needed for Nausea/Vomiting 11/18/21   Clevinger, Heath E, NP-C   pantoprazole (PROTONIX) 40 mg Oral Tablet, Delayed Release (E.C.) Take 1 Tablet (40 mg total) by mouth Once a day    Provider, Historical   rosuvastatin (CRESTOR) 40 mg Oral Tablet Take 1 Tablet (40 mg total) by mouth Every evening Take one tablet every day    Provider, Historical   traMADoL (ULTRAM) 50 mg Oral Tablet Take 1 Tablet (50 mg total) by mouth Every 6 hours as needed for Pain 11/09/21   Ammie Ferrier, MD          Current medications   heparin 25,000 units in 0.45% NS 250 mL infusion, 12 Units/kg/hr (Adjusted), Intravenous, Continuous            Physical:  Filed Vitals:    12/16/21 1215  12/16/21 1330 12/16/21 1433 12/16/21 1500   BP: (!) 191/122 (!) 193/135 (!) 185/131 (!) 186/120   Pulse: (!) 101 (!) 102 95 90   Resp: (!) 22 (!) 37 (!) 22 19   Temp:   36.3 C (97.4 F) 36.5 C (97.7 F)   SpO2: 99% 98% 98% 100%        Intake/Output Summary (Last 24 hours) at 12/16/2021 1550  Last data filed at 12/16/2021 1200  Gross per 24 hour   Intake 500 ml   Output --   Net 500 ml        Patient is alert awake. Normal mood and affect.  HEENT normocephalic atraumatic.  Eye exam normal inspection.    Mucous membranes moist, no jaundice.  Neck exam no JVD normal inspection.  Cardiovascular system: Regular rate and rhythm no murmurs rubs or gallops. No chest wall tenderness  Lungs: Clear to auscultation bilaterally no wheezing no rhonchi.  Abdomen soft nontender nondistended.  Extremities no clubbing cyanosis or edema   Neuro exam: EOMI, normal speech    Labs:  CBC:     12.2* (10/23 0754) \   9.9* (10/23 0754) /   316 (10/23 0754)      / 31.2* (10/23 0754) \          BMP:   134* (10/23 0754)  94* (10/23 1275) 39* (10/23 1700)    /     96 (10/23 0754)   4.7 (10/23 1749) 26 (10/23 0754) 12.09* (10/23 0754) \                 Diagnostic studies:  Imaging:   ECG 12 LEAD  Normal sinus rhythm  Possible Left atrial enlargement  Minimal voltage criteria for LVH, may be normal variant ( Cornell product )  Prolonged QT  Abnormal ECG  When compared with ECG of 09-Dec-2021 05:48,  Questionable change in QRS axis  ST elevation now present in Anterolateral leads  T wave inversion now evident in Lateral leads  Confirmed by Gloriann Loan, Kristen ([id number]) on 12/16/2021 1:20:16 PM  XR AP MOBILE CHEST  Narrative: Jolanda Mccleese    RADIOLOGIST: Berton Bon, MD    XR AP MOBILE CHEST performed on 12/16/2021 8:07 AM    CLINICAL HISTORY: Chest Pain.  CP w/SOB    TECHNIQUE: Frontal view of the chest.    COMPARISON:  12/09/2021    FINDINGS:  Right central venous catheter in the SVC   Heart size is mildly enlarged.     There are increased perihilar  markings   Impression: Cardiomegaly with mild central vascular congestion similar to the prior exam    Radiologist location ID: [id number]      Assessments:  There are no active hospital problems to display for this patient.      Plan:         End-stage renal disease on hemodialysis  -Continue dialysis 3 times a week on MWF   -please refer to dialysis orders  -right upper chest PermCath.    Anemia  -Monitor  -Epogen     CKD MBD  -Phosphorus  -Renal diet    Acid-base  -Stable  -Continue dialysis    Electrolytes  -Monitor  -See dialysis orders  -Replace as needed    Volume status  -Fluid restriction  -Dialysis    Diet  -Renal diet    Non-STEMI  -c/s cardiology   - Heparin Drip   -h/o of CAD. Medical management only     HTN urgency /emergency   - ? ICU   - hd   - NTG drip.          MY ORDERS LAST 24 (24h ago, onward)      None                        Beather Arbour, MD, FASN, 12/16/2021, 15:50

## 2021-12-16 NOTE — ED Nurses Note (Signed)
[facility] contacted to transport patient to [facility] ED at this time

## 2021-12-16 NOTE — ED Nurses Note (Signed)
Report given and care endorsed to Bradie, RN

## 2021-12-16 NOTE — ED Provider Notes (Signed)
[facility], [facility] Emergency Department  ED Primary Provider Note  History of Present Illness   Chief Complaint   Patient presents with    Chest Pain      [name] is a 46 y.o. female who had concerns including Chest Pain .  Arrival: The patient arrived by Ambulance complaining of left-sided chest pain which awoke her from sleep around 4:00 a.m.Marland Kitchen  She states the pain radiates to her neck and down her left arm.  Patient states she also has shortness of breath with the pain.  She stated vomiting 1 time.  She denies any diaphoresis.  Patient states she took 8 baby aspirin through the course of the morning.  She also has had 3 nitroglycerin.  She states she still has the same pain in the left side of her chest.  She states it feels like a stabbing pain.  Patient does have 3 stents.  Patient also has hypertension and high cholesterol.  She states quitting smoking 4 months ago.  She also admits to having family history of CAD.  Patient has had an MI in the past.  Patient has history of CHF, mitral valve regurg, tricuspid valve regurg, and history of end-stage renal disease of which she gets dialysis Monday Wednesday and Friday.  Patient's last dialysis was Friday.    HPI  Review of Systems   Review of Systems   Constitutional:  Positive for activity change, appetite change and fatigue. Negative for chills and fever.   HENT:  Negative for ear pain and sore throat.    Eyes:  Negative for pain and visual disturbance.   Respiratory:  Positive for shortness of breath. Negative for cough.    Cardiovascular:  Positive for chest pain. Negative for palpitations.   Gastrointestinal:  Positive for nausea and vomiting. Negative for abdominal pain.   Genitourinary:  Negative for dysuria and hematuria.   Musculoskeletal:  Negative for arthralgias and back pain.   Skin:  Negative for color change and rash.   Neurological:  Positive for weakness. Negative for seizures and syncope.   All other  systems reviewed and are negative.     Historical Data   History Reviewed This Encounter:     Physical Exam   ED Triage Vitals   BP (Non-Invasive) 12/16/21 0745 (!) 183/120   Heart Rate 12/16/21 0745 91   Respiratory Rate 12/16/21 0745 (!) 29   Temperature 12/16/21 0747 36.9 C (98.4 F)   SpO2 12/16/21 0745 95 %   Weight 12/16/21 0739 78 kg (171 lb 15.3 oz)   Height 12/16/21 0739 1.778 m (5' 10" )     Physical Exam  Vitals and nursing note reviewed.   Constitutional:       General: She is not in acute distress.     Appearance: She is well-developed. She is obese.   HENT:      Head: Normocephalic and atraumatic.      Right Ear: External ear normal.      Left Ear: External ear normal.      Nose: Nose normal.      Mouth/Throat:      Mouth: Mucous membranes are moist.   Eyes:      Extraocular Movements: Extraocular movements intact.      Conjunctiva/sclera: Conjunctivae normal.      Pupils: Pupils are equal, round, and reactive to light.   Cardiovascular:      Rate and Rhythm: Normal rate and regular rhythm.  Pulses: Normal pulses.      Heart sounds: Normal heart sounds. No murmur heard.  Pulmonary:      Effort: Pulmonary effort is normal. No respiratory distress.      Breath sounds: Rales present.      Comments: Bibasilar rales  Abdominal:      General: Bowel sounds are normal.      Palpations: Abdomen is soft.      Tenderness: There is no abdominal tenderness.   Musculoskeletal:         General: No swelling. Normal range of motion.      Cervical back: Normal range of motion and neck supple.   Skin:     General: Skin is warm and dry.      Capillary Refill: Capillary refill takes less than 2 seconds.   Neurological:      General: No focal deficit present.      Mental Status: She is alert and oriented to person, place, and time.   Psychiatric:         Mood and Affect: Mood normal.         Behavior: Behavior normal.         Thought Content: Thought content normal.         Judgment: Judgment normal.       Patient Data      Labs Ordered/Reviewed   COMPREHENSIVE METABOLIC PANEL, NON-FASTING - Abnormal; Notable for the following components:       Result Value    SODIUM 134 (*)     CHLORIDE 94 (*)     ANION GAP 14 (*)     BUN 39 (*)     CREATININE 12.09 (*)     ESTIMATED GFR 4 (*)     PROTEIN TOTAL 8.7 (*)     ALBUMIN/GLOBULIN RATIO 0.7 (*)     All other components within normal limits    Narrative:     Estimated Glomerular Filtration Rate (eGFR) is calculated using the CKD-EPI (2021) equation, intended for patients 52 years of age and older. If gender is not documented or "unknown", there will be no eGFR calculation.   TROPONIN-I - Abnormal; Notable for the following components:    TROPONIN I 91 (*)     All other components within normal limits    Narrative:     Values received on females ranging between 12-15 ng/L MUST include the next serial troponin to review changes in the delta differences as the reference range for the Access II chemistry analyzer is lower than the established reference range.     TROPONIN-I - Abnormal; Notable for the following components:    TROPONIN I 87 (*)     All other components within normal limits    Narrative:     Values received on females ranging between 12-15 ng/L MUST include the next serial troponin to review changes in the delta differences as the reference range for the Access II chemistry analyzer is lower than the established reference range.     PT/INR - Abnormal; Notable for the following components:    PROTHROMBIN TIME 13.1 (*)     INR 1.13 (*)     All other components within normal limits    Narrative:     INR OF 2.0-3.0  RECOMMENDED FOR: PROPHYLAXIS/TREATMENT OF VENEOUS THROMBOSIS, PULMONARY EMBOLISM, PREVENTION OF SYSTEMIC EMBOLISM FROM ATRIAL FIBRILATION, MYOCARDIAL INFARCTION.    INR OF 2.5-3.5  RECOMMENDED FOR MECHANICAL PROSTHETIC HEART VALVES, RECURRENT SYSTEMIC EMBOLISM, RECURRENT MYOCARDIAL INFARCTION.  PTT (PARTIAL THROMBOPLASTIN TIME) - Abnormal; Notable for the following  components:    APTT 63.1 (*)     All other components within normal limits   CBC WITH DIFF - Abnormal; Notable for the following components:    WBC 12.2 (*)     RBC 3.41 (*)     HGB 9.9 (*)     HCT 31.2 (*)     MCHC 31.6 (*)     RDW 19.6 (*)     NEUTROPHIL % 92 (*)     LYMPHOCYTE % 4 (*)     NEUTROPHIL # 11.26 (*)     LYMPHOCYTE # 0.47 (*)     All other components within normal limits   MAGNESIUM - Normal   CBC/DIFF    Narrative:     The following orders were created for panel order CBC/DIFF.  Procedure                               Abnormality         Status                     ---------                               -----------         ------                     CBC WITH ZOXW[[id number]]                Abnormal            Final result                 Please view results for these tests on the individual orders.   TROPONIN-I   URINALYSIS WITH REFLEX MICROSCOPIC AND CULTURE IF POSITIVE    Narrative:     The following orders were created for panel order URINALYSIS WITH REFLEX MICROSCOPIC AND CULTURE IF POSITIVE.  Procedure                               Abnormality         Status                     ---------                               -----------         ------                     URINALYSIS, MACRO/MICRO[[id number]]                                                       Please view results for these tests on the individual orders.   URINE DRUG SCREEN   URINALYSIS, MACRO/MICRO     XR AP MOBILE CHEST   Final Result by Edi, Radresults In (10/23 1191)   Cardiomegaly with mild central vascular congestion similar to the prior exam  Radiologist location ID: [id number]        Medical Decision Making  Patient is 46 year old black female complaining left-sided chest pain radiating down left arm.  Pains associated with shortness of breath and nausea vomiting.  Patient took a baby aspirin since the pain started around 4:00 a.m.Marland Kitchen  Patient has also had 3 sublingual nitro.  Patient's last  dialysis was Friday.  Patient is due today for dialysis .patient states she still has left-sided chest pain.  Patient has a history of 3 stents in the past with an MI. patient also has hypertension as well as high cholesterol.  Patient states quitting smoking 4 months ago.  Patient has long history family CAD.  Patient will have labs and an x-ray done including serial troponins.  Patient will be watched in the ER until her labs and serial troponins are back that will depend on whether the patient gets admitted or whether she be discharged home.  I spoke to Dr.eter, nephrologist who wanted patient sent to [facility] immediately for emergent dialysis.  I called and spoke to the hospitalist, Dr.staton, who stated that there were no beds available in that at have to send him to the ER as a hold.  I then spoke to Dr. Rosalee Kaufman, ER attending who accepted the patient for transfer and admission.  Patient will be started on heparin drip for a non-STEMI.  Her 1st troponin was 91 in her 2nd was 73.  Patient also is accelerated hypertension presently.  Patient received hydralazine IV along with Lasix.  Patient will get a 2nd hydralazine trying get the pressure down.        Critical Care Time:  Total critical care time spent in direct care of this patient at high risk based on presenting history/exam/and complaint, including initial evaluation and stabilization, review of data, re-examination, discussion with admitting and consulting services to arrange definitive care, and exclusive of any procedures performed, was 1.5 hour     Amount and/or Complexity of Data Reviewed  Labs: ordered.  Radiology: ordered.  ECG/medicine tests: ordered.     Details: Normal sinus rhythm 87, PR interval 198 MS, QT interval 408 MS, left atrial enlargement, LVH, prolonged QT, poor R-wave progression through the precordium.    Risk  OTC drugs.  Prescription drug management.             Medications Administered in the ED   heparin 5,000 units/mL initial IV  BOLUS (has no administration in time range)   heparin 25,000 units in 0.45% NS 250 mL infusion (has no administration in time range)   NS bolus infusion 500 mL (500 mL Intravenous New Bag/New Syringe 12/16/21 0822)   nitroGLYCERIN (NITROSTAT) sublingual tablet (0.4 mg Sublingual Given 12/16/21 0759)   nitroGLYCERIN (NITRO-BID) 2 % topical ointment (1 Inch Apply Topically Given 12/16/21 0759)   furosemide (LASIX) 120 mg in NS 100 mL IVPB (120 mg Intravenous New Bag/New Syringe 12/16/21 0905)   hydrALAZINE (APRESOLINE) injection 20 mg (20 mg Intravenous Given 12/16/21 0904)     Clinical Impression   Accelerated hypertension (Primary)   Non-STEMI (non-ST elevated myocardial infarction) (CMS HCC)   End-stage renal disease (ESRD) (CMS HCC)   Renal azotemia       Disposition: Transfered to Another Facility               Clinical Impression   Accelerated hypertension (Primary)   Non-STEMI (non-ST elevated myocardial infarction) (CMS  [place])   End-stage renal disease (ESRD) (CMS HCC)   Renal azotemia       Current Discharge Medication List

## 2021-12-16 NOTE — ED Nurses Note (Signed)
[facility], [facility] Emergency Department  Peer Recovery Coach Assessment    Initial Evaluation  Referred by:: Nurse  Location of Evaluation: Emergency Department  How many times in the last 12 months have you been to the ED?: 6 or more  Have you ever served or are you currently serving in the [employer]?: No             Substance Use History  Patient current substance use status: Patient is an avid Marijuana user.    Prior treatment history?: No    Currently enrolled in substance use program?: No    Within the last 30 days, what substances has the patient used?: Marijuana  Patient's age at first substance use?: 12-15  Drug route of administration: Smoked    Has the patient ever had sustained abstinence?: No              Family, Social, Home & Safety History  Marital Status: Divorced            Need to improve relationships with family?: No    Social network: Immediate family, Non-substance using peers/friends/other    Current living situation: Independent  Any help needed with the following?: None  Contact phone number for the patient: [phone]       Has the patient had any legal issues within the past 30 days?: None         Employment  Current employment status: Disabled    Landscape architect?: No  Needs assistance with job search?: No    Engagement  Readiness ruler: 1  Summary of assessment priority areas: comments: Patient does not feel a need to change.    Brief Intervention  Discussed plan to reduce/quit substance use?: Yes  Discussed willingness to enter treatment?: Yes  Indicated patient's stage of change:: 1 - Precontemplation    Patient seen by Peer Recovery Coach and is a candidate for buprenorphine administration in the ED. Patient needs assessment for bup treatment.: No    Plan  Was the patient referred to treatment?: No    Was patient referred to physician for Buprenorphine Assessment in the ED?: No    Did patient receive Narcan in the ED?: No          Follow-up  Patient admitted for treatment?: No        Need for additional follow-up?: No       Rossie Muskrat, Peer Recovery Coach 12/16/2021 08:47

## 2021-12-16 NOTE — ED Nurses Note (Signed)
Patient transported upstairs at this time for dialysis via stretcher by transport.

## 2021-12-17 DIAGNOSIS — Z5329 Procedure and treatment not carried out because of patient's decision for other reasons: Secondary | ICD-10-CM

## 2021-12-17 DIAGNOSIS — I160 Hypertensive urgency: Secondary | ICD-10-CM

## 2021-12-17 LAB — CBC
HCT: 27.8 % — ABNORMAL LOW (ref 31.2–41.9)
HGB: 9.1 g/dL — ABNORMAL LOW (ref 10.9–14.3)
MCH: 28.7 pg (ref 24.7–32.8)
MCHC: 32.7 g/dL (ref 32.3–35.6)
MCV: 87.8 fL (ref 75.5–95.3)
MPV: 7 fL — ABNORMAL LOW (ref 7.9–10.8)
PLATELETS: 299 10*3/uL (ref 140–440)
RBC: 3.16 10*6/uL — ABNORMAL LOW (ref 3.63–4.92)
RDW: 19 % — ABNORMAL HIGH (ref 12.3–17.7)
WBC: 8.1 10*3/uL (ref 3.8–11.8)

## 2021-12-17 LAB — PTT (PARTIAL THROMBOPLASTIN TIME)
APTT: 75.9 seconds — ABNORMAL HIGH (ref 26.0–36.0)
APTT: 54.8 seconds — ABNORMAL HIGH (ref 26.0–36.0)

## 2021-12-17 MED ORDER — ISOSORBIDE MONONITRATE ER 60 MG TABLET,EXTENDED RELEASE 24 HR
ORAL_TABLET | ORAL | Status: AC
Start: 2021-12-17 — End: 2021-12-17
  Filled 2021-12-17: qty 2

## 2021-12-17 MED ORDER — TRAMADOL 50 MG TABLET
ORAL_TABLET | ORAL | Status: AC
Start: 2021-12-17 — End: 2021-12-17
  Filled 2021-12-17: qty 1

## 2021-12-17 MED ORDER — HYDRALAZINE 25 MG TABLET
ORAL_TABLET | ORAL | Status: AC
Start: 2021-12-17 — End: 2021-12-17
  Filled 2021-12-17: qty 4

## 2021-12-17 MED ORDER — OXYCODONE-ACETAMINOPHEN 5 MG-325 MG TABLET
1.0000 | ORAL_TABLET | Freq: Once | ORAL | Status: DC
Start: 2021-12-17 — End: 2021-12-17
  Administered 2021-12-17: 0 via ORAL

## 2021-12-17 MED ORDER — AEROCHAMBER W/ FLOWSIGNAL SPACER
Freq: Once | Status: AC
Start: 2021-12-17 — End: 2021-12-17
  Filled 2021-12-17: qty 1

## 2021-12-17 MED ORDER — OXYCODONE-ACETAMINOPHEN 5 MG-325 MG TABLET
ORAL_TABLET | ORAL | Status: AC
Start: 2021-12-17 — End: 2021-12-17
  Filled 2021-12-17: qty 1

## 2021-12-17 NOTE — ED Nurses Note (Signed)
CALLED TO ROOM, PATIENT STATES She IS LEAVING AMA. IV TAKEN OUT PER PATIENT DEMAND, AMA FORM SIGNED. PATIENT TAKEN TO [place] VIA WC WITH PERSONAL O2 INTACT AND FLOWING.

## 2021-12-17 NOTE — Discharge Summary (Incomplete)
[facility]  DISCHARGE SUMMARY    PATIENT NAME:  [name], [name]  MRN:  [id number]  DOB:  1975/05/19    ENCOUNTER DATE:  12/16/2021  INPATIENT ADMISSION DATE: 12/16/2021  DISCHARGE DATE:  12/17/2021    ATTENDING PHYSICIAN: No att. providers found  SERVICE: PRN HOSPITALIST INTENSIVIST  PRIMARY CARE PHYSICIAN: Cam Hai, FNP       No lay caregiver identified.    PRIMARY DISCHARGE DIAGNOSIS: Hypertensive urgency  Active Hospital Problems    Diagnosis Date Noted    Principal Problem: Hypertensive urgency [I16.0] 09/29/2021    Non-STEMI (non-ST elevated myocardial infarction) (CMS HCC) [I21.4] 12/16/2021    Hypertensive emergency [I16.1] 12/16/2021    End-stage renal disease (ESRD) (CMS HCC) [N18.6] 12/16/2021      Resolved Hospital Problems   No resolved problems to display.     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        Details   acetaminophen 325 mg Tablet  Commonly known as: TYLENOL   Take 2 Tablets (650 mg total) by mouth Every 4 hours as needed  Refills: 0     amLODIPine 10 mg Tablet  Commonly known as: NORVASC   Take 1 Tablet (10 mg total) by mouth Once a day Take one tablet every day  Refills: 0     budesonide-formoteroL 160-4.5 mcg/actuation oral inhaler  Commonly known as: SYMBICORT   Take 2 Puffs by inhalation Twice daily  Refills: 0     calcitrioL 0.5 mcg  Capsule  Commonly known as: ROCALTROL   Take 2 Capsules (1 mcg total) by mouth Every Monday, Wednesday and Friday  Refills: 0     cinacalcet 30 mg Tablet  Commonly known as: SENSIPAR   Take 1 Tablet (30 mg total) by mouth Every evening  Refills: 0     cloNIDine 0.3 mg/24 hr Patch Weekly  Commonly known as: CATAPRES-TTS   Place 1 Patch (0.3 mg total) on the skin Every 7 days  Refills: 0     cloNIDine HCL 0.2 mg Tablet  Commonly known as: CATAPRES   Take 1 Tablet (0.2 mg total) by mouth Twice daily Take one tablet twice a day  Refills: 0     clopidogreL 75 mg Tablet  Commonly known as: PLAVIX   Take 1 Tablet (75 mg total) by mouth Once a day  Refills: 0     cyclobenzaprine 5 mg Tablet  Commonly known as: FLEXERIL   Take 1 Tablet (5 mg total) by mouth Twice per day as needed for Muscle spasms  Refills: 0     doxazosin 4 mg Tablet  Commonly known as: CARDURA   Take 1 Tablet (4 mg total) by mouth Every evening  Refills: 0     famotidine  40 mg Tablet  Commonly known as: PEPCID   Take 1 Tablet (40 mg total) by mouth Once a day  Refills: 0     furosemide 80 mg Tablet  Commonly known as: LASIX   Take 2 Tablets (160 mg total) by mouth Once a day Take after dialysis on dialysis days; otherwise take in morning daily.  Refills: 0     hydrALAZINE 100 mg Tablet  Commonly known as: APRESOLINE   Take 1 Tablet (100 mg total) by mouth Every 8 hours for 30 days  Qty: 90 Tablet  Refills: 0     ipratropium-albuteroL 0.5 mg-3 mg(2.5 mg base)/3 mL nebulizer solution  Commonly known as: DUONEB   Take 3 mL by nebulization Four times a day as needed  Refills: 0     Isosorbide Mononitrate 120 mg Tablet Sustained Release 24 hr  Commonly known as: IMDUR   Take 1 Tablet (120 mg total) by mouth Every morning for 30 days  Qty: 30 Tablet  Refills: 0     labetaloL 300 mg Tablet  Commonly known as: NORMODYNE   Take 1 Tablet (300 mg total) by mouth Twice daily  Refills: 0     montelukast 10 mg Tablet  Commonly known as: SINGULAIR   Take 1 Tablet (10 mg  total) by mouth Every evening  Refills: 0     nitroGLYCERIN 0.4 mg Tablet, Sublingual  Commonly known as: NITROSTAT   Place 1 Tablet (0.4 mg total) under the tongue Every 5 minutes as needed for Chest pain for up to 3 doses for 3 doses over 15 minutes  Qty: 3 Tablet  Refills: 0     ondansetron 4 mg Tablet, Rapid Dissolve  Commonly known as: ZOFRAN ODT   Take 1 Tablet (4 mg total) by mouth Every 8 hours as needed for Nausea/Vomiting  Qty: 12 Tablet  Refills: 0     pantoprazole 40 mg Tablet, Delayed Release (E.C.)  Commonly known as: PROTONIX   Take 1 Tablet (40 mg total) by mouth Once a day  Refills: 0     rosuvastatin 40 mg Tablet  Commonly known as: CRESTOR   Take 1 Tablet (40 mg total) by mouth Every evening Take one tablet every day  Refills: 0     traMADoL 50 mg Tablet  Commonly known as: ULTRAM   Take 1 Tablet (50 mg total) by mouth Every 6 hours as needed for Pain  Qty: 20 Tablet  Refills: 0            ASK your doctor about these medications.        Details   calcium acetate(phosphat bind) 667 mg Capsule  Commonly known as: PHOSLO  Ask about: Should I take this medication?   Take 2 Capsules (1,334 mg total) by mouth Three times daily with meals for 30 days  Qty: 180 Capsule  Refills: 0     epoetin alfa-epbx 10,000 unit/mL Solution  Commonly known as: RETACRIT  Ask about: Should I take this medication?   Infuse 1 mL (10,000 Units total) into a venous catheter Give in Dialysis for 30 days  Refills: 0            Discharge med list refreshed?  YES     Allergies   Allergen Reactions    Lisinopril Swelling     Tongue and throat swelling    Cardene [Nicardipine]  Other Adverse Reaction (Add comment)     Chest pain     Oxycodone Itching  HOSPITAL PROCEDURE(S):   No orders of the defined types were placed in this encounter.      REASON FOR HOSPITALIZATION AND HOSPITAL COURSE   BRIEF HPI:  This is a 46 y.o., female admitted for CP.  BRIEF HOSPITAL NARRATIVE:       46 year old well known to service due to multiple  admissions.  She has a history of end-stage renal disease, coronary artery disease, hypertension, history of SVC syndrome who presented to [facility] emergency department with chest pain.  Notes that it is her typical chest pain that she gets.  She is been evaluated numerous times in the past by Cardiology including catheterizations.  At this time it is felt that she needs medical management.  She did appear to have ST segment changes in her anterior lateral leads.  Her troponins were similar to what they have been previously.  I asked the ED staff to reach out to Interventional Cardiology with regards to if any intervention needed to be done for her EKG changes.  Emergency staff reach out to me and let me know that they had spoken with Interventional Cardiology, Dr Leonides Schanz with response that patient did not need transfer in would be taken care of medically.  Upon transfer from [facility] emergency department to [facility] Emergency Department she was noted to be hypertensive.  She was placed on a nitroglycerin drip.  With regards to her blood pressure she notes that she is been taking all her medications except that she has run out of her clonidine patch.  She states the pharmacy has not gotten any ischemia and therefore she is been out of it.     She was admitted to CCU on nitro and heparin drip.  Bed was not available in ICU/CCU and patient did have to say in the ER.  Upon rounding this a.m., nursing staff told us that patient left AMA earlier.  TRANSITION/POST DISCHARGE CARE/PENDING TESTS/REFERRALS: ***    CONDITION ON DISCHARGE:  A. Ambulation: {Ambulation:50024333}  B. Self-care Ability: {Self Care:50023725}  C. Cognitive Status {Condition:50023629}  D. Code status at discharge:       LINES/DRAINS/WOUNDS AT DISCHARGE:   Patient Lines/Drains/Airways Status       Active Line / Dialysis Catheter / Dialysis Graft / Drain / Airway / Wound       Name Placement date Placement time Site Days    AV Fistula Left;Upper Arm  12/05/20  1502  -- 377    Dialysis Catheter Cuffed/Tunneled 08/13/21  1316  -- 126                    DISCHARGE DISPOSITION:  {DISCHARGE DISPOSITION:25849::"Home discharge"}  DISCHARGE INSTRUCTIONS:  Post-Discharge Follow Up Appointments       Wednesday Mar 26, 2022    Return Patient Visit with Beather Arbour, MD at  1:00 PM      Nephrology, [place], [place] [address]  [phone]          No discharge procedures on file.       Angelina Pih, NP-C    Copies sent to Care Team         Relationship Specialty Notifications Start End    Deel, Leona Carry, FNP PCP - General NURSE PRACTITIONER  12/04/20     Phone: [phone] Fax: [phone]         [address] [address]    Beather Arbour,  MD  NEPHROLOGY  11/15/21     Phone: [phone] Fax: [phone]         [address]  [place] [address]    Wilma Flavin, [place]  PULMONARY DISEASE  11/15/21     Phone: [phone] Fax: [phone]         [place] [address]    Sherlyn Lees, MD  CARDIOVASCULAR DISEASE  11/15/21     Phone: [phone] Fax: [phone]         [address] [place] [address]            Referring providers can utilize [email or web address] to access their referred [place] patient's information.

## 2021-12-31 ENCOUNTER — Inpatient Hospital Stay
Admission: EM | Admit: 2021-12-31 | Discharge: 2022-01-03 | DRG: 193 | Disposition: A | Discharge status: Home / Self Care | Payer: Medicare Other | Attending: Internal Medicine | Admitting: Internal Medicine

## 2021-12-31 ENCOUNTER — Inpatient Hospital Stay (HOSPITAL_COMMUNITY): Payer: Medicare Other | Admitting: Internal Medicine

## 2021-12-31 ENCOUNTER — Emergency Department (HOSPITAL_BASED_OUTPATIENT_CLINIC_OR_DEPARTMENT_OTHER): Payer: Medicare Other

## 2021-12-31 ENCOUNTER — Encounter (HOSPITAL_BASED_OUTPATIENT_CLINIC_OR_DEPARTMENT_OTHER): Payer: Self-pay

## 2021-12-31 ENCOUNTER — Other Ambulatory Visit: Payer: Self-pay

## 2021-12-31 DIAGNOSIS — Z7951 Long term (current) use of inhaled steroids: Secondary | ICD-10-CM

## 2021-12-31 DIAGNOSIS — N189 Chronic kidney disease, unspecified: Secondary | ICD-10-CM | POA: Diagnosis present

## 2021-12-31 DIAGNOSIS — Z9861 Coronary angioplasty status: Secondary | ICD-10-CM

## 2021-12-31 DIAGNOSIS — G4730 Sleep apnea, unspecified: Secondary | ICD-10-CM | POA: Diagnosis present

## 2021-12-31 DIAGNOSIS — F17200 Nicotine dependence, unspecified, uncomplicated: Secondary | ICD-10-CM | POA: Diagnosis present

## 2021-12-31 DIAGNOSIS — I2510 Atherosclerotic heart disease of native coronary artery without angina pectoris: Secondary | ICD-10-CM | POA: Diagnosis present

## 2021-12-31 DIAGNOSIS — J9811 Atelectasis: Secondary | ICD-10-CM | POA: Diagnosis present

## 2021-12-31 DIAGNOSIS — I10 Essential (primary) hypertension: Secondary | ICD-10-CM

## 2021-12-31 DIAGNOSIS — N186 End stage renal disease: Secondary | ICD-10-CM | POA: Diagnosis present

## 2021-12-31 DIAGNOSIS — Z992 Dependence on renal dialysis: Secondary | ICD-10-CM

## 2021-12-31 DIAGNOSIS — K219 Gastro-esophageal reflux disease without esophagitis: Secondary | ICD-10-CM | POA: Diagnosis present

## 2021-12-31 DIAGNOSIS — Z1152 Encounter for screening for COVID-19: Secondary | ICD-10-CM

## 2021-12-31 DIAGNOSIS — D631 Anemia in chronic kidney disease: Secondary | ICD-10-CM | POA: Diagnosis present

## 2021-12-31 DIAGNOSIS — I081 Rheumatic disorders of both mitral and tricuspid valves: Secondary | ICD-10-CM | POA: Diagnosis present

## 2021-12-31 DIAGNOSIS — J189 Pneumonia, unspecified organism: Principal | ICD-10-CM | POA: Diagnosis present

## 2021-12-31 DIAGNOSIS — Z515 Encounter for palliative care: Secondary | ICD-10-CM

## 2021-12-31 DIAGNOSIS — Z86718 Personal history of other venous thrombosis and embolism: Secondary | ICD-10-CM

## 2021-12-31 DIAGNOSIS — I252 Old myocardial infarction: Secondary | ICD-10-CM

## 2021-12-31 DIAGNOSIS — D649 Anemia, unspecified: Secondary | ICD-10-CM | POA: Diagnosis present

## 2021-12-31 DIAGNOSIS — I5032 Chronic diastolic (congestive) heart failure: Secondary | ICD-10-CM | POA: Diagnosis present

## 2021-12-31 DIAGNOSIS — G8929 Other chronic pain: Secondary | ICD-10-CM | POA: Diagnosis present

## 2021-12-31 DIAGNOSIS — F17210 Nicotine dependence, cigarettes, uncomplicated: Secondary | ICD-10-CM

## 2021-12-31 DIAGNOSIS — Z9981 Dependence on supplemental oxygen: Secondary | ICD-10-CM

## 2021-12-31 DIAGNOSIS — J440 Chronic obstructive pulmonary disease with acute lower respiratory infection: Secondary | ICD-10-CM | POA: Diagnosis present

## 2021-12-31 DIAGNOSIS — Z79899 Other long term (current) drug therapy: Secondary | ICD-10-CM

## 2021-12-31 DIAGNOSIS — E875 Hyperkalemia: Secondary | ICD-10-CM | POA: Diagnosis present

## 2021-12-31 DIAGNOSIS — R0902 Hypoxemia: Secondary | ICD-10-CM | POA: Diagnosis present

## 2021-12-31 DIAGNOSIS — I132 Hypertensive heart and chronic kidney disease with heart failure and with stage 5 chronic kidney disease, or end stage renal disease: Secondary | ICD-10-CM | POA: Diagnosis present

## 2021-12-31 DIAGNOSIS — Z7902 Long term (current) use of antithrombotics/antiplatelets: Secondary | ICD-10-CM

## 2021-12-31 LAB — TROPONIN-I: TROPONIN I: 79 ng/L — ABNORMAL HIGH (ref <15)

## 2021-12-31 LAB — COMPREHENSIVE METABOLIC PANEL, NON-FASTING
ALBUMIN/GLOBULIN RATIO: 0.7 — ABNORMAL LOW (ref 0.8–1.4)
ALBUMIN: 3.4 g/dL (ref 3.4–5.0)
ALKALINE PHOSPHATASE: 96 U/L (ref 46–116)
ALT (SGPT): 9 U/L (ref <=78)
ANION GAP: 19 mmol/L — ABNORMAL HIGH (ref 4–13)
AST (SGOT): 17 U/L (ref 15–37)
BILIRUBIN TOTAL: 0.6 mg/dL (ref 0.2–1.0)
BUN/CREA RATIO: 6
BUN: 98 mg/dL — ABNORMAL HIGH (ref 7–18)
CALCIUM, CORRECTED: 10.4 mg/dL
CALCIUM: 9.9 mg/dL (ref 8.5–10.1)
CHLORIDE: 89 mmol/L — ABNORMAL LOW (ref 98–107)
CO2 TOTAL: 20 mmol/L — ABNORMAL LOW (ref 21–32)
CREATININE: 15.75 mg/dL — ABNORMAL HIGH (ref 0.55–1.02)
ESTIMATED GFR: 3 mL/min/{1.73_m2} — ABNORMAL LOW (ref >59)
GLOBULIN: 4.9
GLUCOSE: 84 mg/dL (ref 74–106)
OSMOLALITY, CALCULATED: 287 mOsm/kg (ref 270–290)
POTASSIUM: 5.5 mmol/L — ABNORMAL HIGH (ref 3.5–5.1)
PROTEIN TOTAL: 8.3 g/dL — ABNORMAL HIGH (ref 6.4–8.2)
SODIUM: 128 mmol/L — ABNORMAL LOW (ref 136–145)

## 2021-12-31 LAB — CBC WITH DIFF
BASOPHIL #: 0.05 10*3/uL (ref 0.00–0.30)
BASOPHIL %: 0 % (ref 0–3)
EOSINOPHIL #: 0.17 10*3/uL (ref 0.00–0.80)
EOSINOPHIL %: 1 % (ref 0–7)
HCT: 29.5 % — ABNORMAL LOW (ref 37.0–47.0)
HGB: 9.5 g/dL — ABNORMAL LOW (ref 12.5–16.0)
LYMPHOCYTE #: 0.77 10*3/uL — ABNORMAL LOW (ref 1.10–5.00)
LYMPHOCYTE %: 6 % — ABNORMAL LOW (ref 25–45)
MCH: 29.2 pg (ref 27.0–32.0)
MCHC: 32.2 g/dL (ref 32.0–36.0)
MCV: 90.9 fL (ref 78.0–99.0)
MONOCYTE #: 0.72 10*3/uL (ref 0.00–1.30)
MONOCYTE %: 6 % (ref 0–12)
MPV: 8.2 fL (ref 7.4–10.4)
NEUTROPHIL #: 10.34 10*3/uL — ABNORMAL HIGH (ref 1.80–8.40)
NEUTROPHIL %: 86 % — ABNORMAL HIGH (ref 40–76)
PLATELETS: 350 10*3/uL (ref 140–440)
RBC: 3.25 10*6/uL — ABNORMAL LOW (ref 4.20–5.40)
RDW: 21 % — ABNORMAL HIGH (ref 11.6–14.8)
WBC: 12.1 10*3/uL — ABNORMAL HIGH (ref 4.0–10.5)

## 2021-12-31 LAB — COVID-19, FLU A/B, RSV RAPID BY PCR
INFLUENZA VIRUS TYPE A: NOT DETECTED
INFLUENZA VIRUS TYPE B: NOT DETECTED
RESPIRATORY SYNCTIAL VIRUS (RSV): NOT DETECTED
SARS-CoV-2: NOT DETECTED

## 2021-12-31 LAB — LACTIC ACID LEVEL W/ REFLEX FOR LEVEL >2.0: LACTIC ACID: 0.4 mmol/L (ref 0.4–2.0)

## 2021-12-31 MED ORDER — SODIUM CHLORIDE 0.9 % INTRAVENOUS PIGGYBACK
2.0000 g | INTRAVENOUS | Status: AC
Start: 2021-12-31 — End: 2022-01-01
  Administered 2021-12-31: 2 g via INTRAVENOUS
  Administered 2022-01-01: 0 g via INTRAVENOUS

## 2021-12-31 MED ORDER — ACETAMINOPHEN 325 MG TABLET
ORAL_TABLET | ORAL | Status: AC
Start: 2021-12-31 — End: 2021-12-31
  Filled 2021-12-31: qty 2

## 2021-12-31 MED ORDER — HYDRALAZINE 20 MG/ML INJECTION SOLUTION
INTRAMUSCULAR | Status: AC
Start: 2021-12-31 — End: 2021-12-31
  Filled 2021-12-31: qty 1

## 2021-12-31 MED ORDER — SODIUM CHLORIDE 0.9 % INTRAVENOUS SOLUTION
500.0000 mg | INTRAVENOUS | Status: AC
Start: 2021-12-31 — End: 2022-01-01
  Administered 2021-12-31: 500 mg via INTRAVENOUS
  Administered 2022-01-01: 0 mg via INTRAVENOUS
  Filled 2021-12-31: qty 5

## 2021-12-31 MED ORDER — ACETAMINOPHEN 325 MG TABLET
650.0000 mg | ORAL_TABLET | ORAL | Status: AC
Start: 2021-12-31 — End: 2021-12-31
  Administered 2021-12-31: 650 mg via ORAL

## 2021-12-31 MED ORDER — SODIUM CHLORIDE 0.9 % (FLUSH) INJECTION SYRINGE
3.0000 mL | INJECTION | INTRAMUSCULAR | Status: DC | PRN
Start: 2021-12-31 — End: 2022-01-03

## 2021-12-31 MED ORDER — ACETAMINOPHEN 325 MG TABLET
650.0000 mg | ORAL_TABLET | ORAL | Status: DC
Start: 2021-12-31 — End: 2021-12-31

## 2021-12-31 MED ORDER — HYDROCODONE 5 MG-ACETAMINOPHEN 325 MG TABLET
1.0000 | ORAL_TABLET | ORAL | Status: AC
Start: 2021-12-31 — End: 2021-12-31
  Administered 2021-12-31: 1 via ORAL

## 2021-12-31 MED ORDER — HYDRALAZINE 20 MG/ML INJECTION SOLUTION
20.0000 mg | INTRAMUSCULAR | Status: AC
Start: 2021-12-31 — End: 2021-12-31
  Administered 2021-12-31: 20 mg via INTRAVENOUS

## 2021-12-31 MED ORDER — ALBUTEROL SULFATE HFA 90 MCG/ACTUATION AEROSOL INHALER
INHALATION_SPRAY | RESPIRATORY_TRACT | Status: AC
Start: 2021-12-31 — End: 2021-12-31
  Filled 2021-12-31: qty 8.5

## 2021-12-31 MED ORDER — DEXAMETHASONE SODIUM PHOSPHATE (PF) 10 MG/ML INJECTION SOLUTION
INTRAMUSCULAR | Status: AC
Start: 2021-12-31 — End: 2021-12-31
  Filled 2021-12-31: qty 1

## 2021-12-31 MED ORDER — AZITHROMYCIN 500 MG INTRAVENOUS SOLUTION
INTRAVENOUS | Status: AC
Start: 2021-12-31 — End: 2021-12-31
  Filled 2021-12-31: qty 5

## 2021-12-31 MED ORDER — ALBUTEROL SULFATE HFA 90 MCG/ACTUATION AEROSOL INHALER
2.0000 | INHALATION_SPRAY | Freq: Four times a day (QID) | RESPIRATORY_TRACT | Status: DC | PRN
Start: 2021-12-31 — End: 2022-01-03
  Administered 2021-12-31: 2 via RESPIRATORY_TRACT

## 2021-12-31 MED ORDER — DEXAMETHASONE SODIUM PHOSPHATE (PF) 10 MG/ML INJECTION SOLUTION
10.0000 mg | INTRAMUSCULAR | Status: AC
Start: 2021-12-31 — End: 2021-12-31
  Administered 2021-12-31: 10 mg via INTRAVENOUS

## 2021-12-31 MED ORDER — CEFTRIAXONE 2 GRAM SOLUTION FOR INJECTION
INTRAMUSCULAR | Status: AC
Start: 2021-12-31 — End: 2021-12-31
  Filled 2021-12-31: qty 20

## 2021-12-31 MED ORDER — SODIUM CHLORIDE 0.9 % (FLUSH) INJECTION SYRINGE
3.0000 mL | INJECTION | Freq: Three times a day (TID) | INTRAMUSCULAR | Status: DC
Start: 2021-12-31 — End: 2022-01-03
  Administered 2021-12-31: 3 mL
  Administered 2022-01-01 (×2): 0 mL
  Administered 2022-01-01 – 2022-01-02 (×2): 3 mL
  Administered 2022-01-02 – 2022-01-03 (×3): 0 mL
  Administered 2022-01-03: 3 mL

## 2021-12-31 MED ORDER — HYDROCODONE 5 MG-ACETAMINOPHEN 325 MG TABLET
ORAL_TABLET | ORAL | Status: AC
Start: 2021-12-31 — End: 2021-12-31
  Filled 2021-12-31: qty 1

## 2021-12-31 MED ORDER — ONDANSETRON HCL (PF) 4 MG/2 ML INJECTION SOLUTION
4.0000 mg | INTRAMUSCULAR | Status: AC
Start: 2022-01-01 — End: 2022-01-01
  Administered 2022-01-01: 4 mg via INTRAVENOUS

## 2021-12-31 MED ORDER — HYDROMORPHONE 2 MG/ML INJECTION WRAPPER
1.0000 mg | INJECTION | INTRAMUSCULAR | Status: AC
Start: 2022-01-01 — End: 2022-01-01
  Administered 2022-01-01: 1 mg via INTRAVENOUS

## 2021-12-31 NOTE — ED Nurses Note (Signed)
Provider in room at this time to place a central line. Attempts made to obtain peripheral IV. No success after multiple nurses attempted.

## 2021-12-31 NOTE — ED Provider Notes (Addendum)
[facility] Emergency Department  ED Primary Provider Note  History of Present Illness   Chief Complaint   Patient presents with    Chest Pain     Shortness of Breath     Arrival: The patient arrived by Ambulance  [name] is a 46 y.o. female who had concerns including Chest Pain  and Shortness of Breath. Cough congestion body aches. Exp to covid at dialysis Friday. Has not hd  since then. States sob.     Review of Systems   Constitutional: + body aches fever, chills + weakness   Skin: No rash or diaphoresis  HENT: + headaches, +congestion  Eyes: No vision changes or photophobia   Cardio: + chest pain, no palpitations or leg swelling   Respiratory: + cough, wheezing +SOB  GI:  No nausea, vomiting or stool changes  GU:  No dysuria, hematuria, or increased frequency  MSK: No muscle aches, joint or back pain  Neuro: No seizures, LOC, numbness, tingling, or focal weakness  Psychiatric: No depression, SI or substance abuse  All other systems reviewed and are negative.    Historical Data   History Reviewed This Encounter: all noted and reviwed    Physical Exam   ED Triage Vitals [12/31/21 2148]   BP (Non-Invasive) (!) 176/99   Heart Rate (!) 115   Respiratory Rate (!) 26   Temperature 37.2 C (99 F)   SpO2 98 %   Weight 85.7 kg (189 lb)   Height 1.702 m (_0 )     Constitutional:  46 y.o. female who appears in no distress. Normal color, no cyanosis.   HENT:   Head: Normocephalic and atraumatic.   Mouth/Throat: Oropharynx is clear and moist.   Eyes: EOMI, PERRL   Neck: Trachea midline. Neck supple.  Cardiovascular: RRR, No murmurs, rubs or gallops. Intact distal pulses.  Pulmonary/Chest: BS equal bilaterally. Mild tachypnea. . + all fields wheezes, rales no  chest tenderness.   Abdominal: Bowel sounds present and normal. Abdomen soft, no tenderness, no rebound and no guarding.  Back: No midline spinal tenderness, no paraspinal tenderness, no CVA tenderness.            Musculoskeletal: No edema, tenderness or deformity.  Skin: warm and dry. No rash, erythema, pallor or cyanosis  Psychiatric: normal mood and affect. Behavior is normal.   Neurological: Patient keenly alert and responsive, easily able to raise eyebrows, facial muscles/expressions symmetric, speaking in fluent sentences, moving all extremities equally and fully, normal gait  Patient Data   Labs Ordered/Reviewed   COMPREHENSIVE METABOLIC PANEL, NON-FASTING - Abnormal; Notable for the following components:       Result Value    SODIUM 128 (*)     POTASSIUM 5.5 (*)     CHLORIDE 89 (*)     CO2 TOTAL 20 (*)     ANION GAP 19 (*)     BUN 98 (*)     CREATININE 15.75 (*)     ESTIMATED GFR 3 (*)     PROTEIN TOTAL 8.3 (*)     ALBUMIN/GLOBULIN RATIO 0.7 (*)     All other components within normal limits    Narrative:     Above range, check for dilution.  Estimated Glomerular Filtration Rate (eGFR) is calculated using the CKD-EPI (2021) equation, intended for patients 27 years of age and older. If gender is not documented or "unknown", there will be no eGFR calculation.   CBC WITH DIFF - Abnormal;  Notable for the following components:    WBC 12.1 (*)     RBC 3.25 (*)     HGB 9.5 (*)     HCT 29.5 (*)     RDW 21.0 (*)     NEUTROPHIL % 86 (*)     LYMPHOCYTE % 6 (*)     NEUTROPHIL # 10.34 (*)     LYMPHOCYTE # 0.77 (*)     All other components within normal limits   TROPONIN-I - Abnormal; Notable for the following components:    TROPONIN I 79 (*)     All other components within normal limits    Narrative:     Values received on females ranging between 12-15 ng/L MUST include the next serial troponin to review changes in the delta differences as the reference range for the Access II chemistry analyzer is lower than the established reference range.     COVID-19, FLU A/B, RSV RAPID BY PCR - Normal    Narrative:     Results are for the simultaneous qualitative identification of SARS-CoV-2 (formerly 2019-nCoV), Influenza A, Influenza B,  and RSV RNA. These etiologic agents are generally detectable in nasopharyngeal and nasal swabs during the ACUTE PHASE of infection. Hence, this test is intended to be performed on respiratory specimens collected from individuals with signs and symptoms of upper respiratory tract infection who meet Centers for Disease Control and Prevention (CDC) clinical and/or epidemiological criteria for Coronavirus Disease 2019 (COVID-19) testing. CDC COVID-19 criteria for testing on human specimens is available at [facility] webpage information for Healthcare Professionals: Coronavirus Disease 2019 (COVID-19) ([employer]uk).     False-negative results may occur if the virus has genomic mutations, insertions, deletions, or rearrangements or if performed very early in the course of illness. Otherwise, negative results indicate virus specific RNA targets are not detected, however negative results do not preclude SARS-CoV-2 infection/COVID-19, Influenza, or Respiratory syncytial virus infection. Results should not be used as the sole basis for patient management decisions. Negative results must be combined with clinical observations, patient history, and epidemiological information. If upper respiratory tract infection is still suspected based on exposure history together with other clinical findings, re-testing should be considered.    Disclaimer:   This assay has been authorized by FDA under an Emergency Use Authorization for use in laboratories certified under the Clinical Laboratory Improvement Amendments of 1988 (CLIA), 42 U.S.C. [phone], to perform high complexity tests. The impacts of vaccines, antiviral therapeutics, antibiotics, chemotherapeutic or immunosuppressant drugs have not been evaluated.     Test methodology:   Cepheid Xpert Xpress SARS-CoV-2/Flu/RSV Assay real-time polymerase chain reaction (RT-PCR) test on the GeneXpert Dx and Xpert Xpress systems.   LACTIC ACID LEVEL W/  REFLEX FOR LEVEL >2.0 - Normal   ADULT ROUTINE BLOOD CULTURE, SET OF 2 BOTTLES (BACTERIA AND YEAST)   ADULT ROUTINE BLOOD CULTURE, SET OF 2 BOTTLES (BACTERIA AND YEAST)   CBC/DIFF    Narrative:     The following orders were created for panel order CBC/DIFF.  Procedure                               Abnormality         Status                     ---------                               -----------         ------  CBC WITH [facility][[id number]]                Abnormal            Final result                 Please view results for these tests on the individual orders.   TROPONIN-I   TROPONIN-I     XR CHEST AP   Final Result by Edi, Radresults In (11/07 2235)   Mild vascular congestion with ill-defined infiltrates within both perihilar regions as well as the right lung base.      Stable mild enlargement of the cardiac silhouette.         Radiologist location ID: [place] Decision Making   Diff dx of covid pneumonia flu bronchitis. Sepsis.   2256 care endorsed to dr. Rise Patience.     Medications Administered in the ED   albuterol 90 mcg per inhalation oral inhaler - "Respiratory to administer" (2 Puffs Inhalation Given 12/31/21 2156)   NS flush syringe (3 mL Intracatheter Given 12/31/21 2200)   NS flush syringe (has no administration in time range)   cefTRIAXone (ROCEPHIN) 2 g in NS 50 mL IVPB minibag (has no administration in time range)   azithromycin (ZITHROMAX) 500 mg in NS 250 mL IVPB (has no administration in time range)   dexAMETHasone (PF) 10 mg/mL injection (has no administration in time range)   hydrALAZINE (APRESOLINE) injection 20 mg (has no administration in time range)   HYDROcodone-acetaminophen (NORCO) 5-325 mg per tablet (1 Tablet Oral Given 12/31/21 2243)   acetaminophen (TYLENOL) tablet (650 mg Oral Given 12/31/21 2240)     Clinical Impression   Bilateral pneumonia (Primary)   End-stage renal disease (ESRD) (CMS HCC)   Hypertension, unspecified type       Disposition:  Admitted

## 2021-12-31 NOTE — ED Nurses Note (Addendum)
Pt states that symptoms started yesterday. States that she got up and "felt bad." States that she called dialysis and told them. States that they asked her if she was running a fever and she checked her temp and she states it was "101.22F" She states that she has had body aches and chills since then. States that she has had chest pain and shortness of breath for approx the last 2-3 hours. According to EMS, states they gave 324mg  of aspirin and one nitro. States that they gave a breathing treatment enroute. Pt is alert and oriented at this time. Resp even and non labored. Equal chest rise and fall. Pt is on oxygen at 4 lpm via nasal cannula. Pt has chronic cough non productive. Pt lung sounds diminished bilaterally with rales. Call bell in reach. Pt has her clonidine patch on her left shoulder.

## 2021-12-31 NOTE — ED Triage Notes (Signed)
EMS reports fever/SOB X 2-3 days. CP X 2-3 hours . Last dialysis on Friday. 324mg  ASA, X 1 nitro, X 1 duoneb given en route.

## 2022-01-01 ENCOUNTER — Encounter (HOSPITAL_COMMUNITY): Payer: Self-pay | Admitting: Internal Medicine

## 2022-01-01 DIAGNOSIS — I440 Atrioventricular block, first degree: Secondary | ICD-10-CM

## 2022-01-01 DIAGNOSIS — R9431 Abnormal electrocardiogram [ECG] [EKG]: Secondary | ICD-10-CM

## 2022-01-01 DIAGNOSIS — J189 Pneumonia, unspecified organism: Secondary | ICD-10-CM | POA: Diagnosis present

## 2022-01-01 DIAGNOSIS — E875 Hyperkalemia: Secondary | ICD-10-CM | POA: Insufficient documentation

## 2022-01-01 DIAGNOSIS — I10 Essential (primary) hypertension: Secondary | ICD-10-CM | POA: Diagnosis present

## 2022-01-01 DIAGNOSIS — R079 Chest pain, unspecified: Secondary | ICD-10-CM

## 2022-01-01 DIAGNOSIS — I132 Hypertensive heart and chronic kidney disease with heart failure and with stage 5 chronic kidney disease, or end stage renal disease: Secondary | ICD-10-CM

## 2022-01-01 LAB — COMPREHENSIVE METABOLIC PANEL, NON-FASTING
ALBUMIN/GLOBULIN RATIO: 1.1 (ref 0.8–1.4)
ALBUMIN: 4.4 g/dL (ref 3.5–5.7)
ALKALINE PHOSPHATASE: 89 U/L (ref 34–104)
ALT (SGPT): 8 U/L (ref 7–52)
ANION GAP: 16 mmol/L — ABNORMAL HIGH (ref 4–13)
AST (SGOT): 14 U/L (ref 13–39)
BILIRUBIN TOTAL: 0.5 mg/dL (ref 0.3–1.2)
BUN/CREA RATIO: 6 (ref 6–22)
BUN: 96 mg/dL — ABNORMAL HIGH (ref 7–25)
CALCIUM, CORRECTED: 9.8 mg/dL (ref 8.9–10.8)
CALCIUM: 10.1 mg/dL (ref 8.6–10.3)
CHLORIDE: 92 mmol/L — ABNORMAL LOW (ref 98–107)
CO2 TOTAL: 20 mmol/L — ABNORMAL LOW (ref 21–31)
CREATININE: 15.17 mg/dL — ABNORMAL HIGH (ref 0.60–1.30)
ESTIMATED GFR: 3 mL/min/{1.73_m2} — ABNORMAL LOW (ref >59)
GLOBULIN: 3.9 (ref 2.9–5.4)
GLUCOSE: 149 mg/dL — ABNORMAL HIGH (ref 74–109)
OSMOLALITY, CALCULATED: 290 mOsm/kg (ref 270–290)
POTASSIUM: 6.9 mmol/L (ref 3.5–5.1)
PROTEIN TOTAL: 8.3 g/dL (ref 6.4–8.9)
SODIUM: 128 mmol/L — ABNORMAL LOW (ref 136–145)

## 2022-01-01 LAB — ECG 12 LEAD
Atrial Rate: 86 {beats}/min
Atrial Rate: 83 {beats}/min
Calculated P Axis: 56 degrees
Calculated P Axis: 81 degrees
Calculated R Axis: 87 degrees
Calculated R Axis: 41 degrees
Calculated T Axis: 4 degrees
Calculated T Axis: 88 degrees
PR Interval: 212 ms
PR Interval: 228 ms
QRS Duration: 108 ms
QRS Duration: 110 ms
QT Interval: 402 ms
QT Interval: 452 ms
QTC Calculation: 481 ms
QTC Calculation: 531 ms
Ventricular rate: 86 {beats}/min
Ventricular rate: 83 {beats}/min

## 2022-01-01 LAB — CBC
HCT: 29 % — ABNORMAL LOW (ref 31.2–41.9)
HGB: 9.3 g/dL — ABNORMAL LOW (ref 10.9–14.3)
MCH: 28.3 pg (ref 24.7–32.8)
MCHC: 31.9 g/dL — ABNORMAL LOW (ref 32.3–35.6)
MCV: 88.9 fL (ref 75.5–95.3)
MPV: 7 fL — ABNORMAL LOW (ref 7.9–10.8)
PLATELETS: 345 10*3/uL (ref 140–440)
RBC: 3.27 10*6/uL — ABNORMAL LOW (ref 3.63–4.92)
RDW: 21.4 % — ABNORMAL HIGH (ref 12.3–17.7)
WBC: 12.8 10*3/uL — ABNORMAL HIGH (ref 3.8–11.8)

## 2022-01-01 LAB — TROPONIN-I
TROPONIN I: 79 ng/L — ABNORMAL HIGH (ref <15)
TROPONIN I: 79 ng/L — ABNORMAL HIGH (ref <15)

## 2022-01-01 LAB — MAGNESIUM: MAGNESIUM: 2.3 mg/dL (ref 1.9–2.7)

## 2022-01-01 LAB — POC BLOOD GLUCOSE (RESULTS): GLUCOSE, POC: 125 mg/dl (ref 50–500)

## 2022-01-01 MED ORDER — ISOSORBIDE MONONITRATE ER 60 MG TABLET,EXTENDED RELEASE 24 HR
120.0000 mg | ORAL_TABLET | Freq: Every morning | ORAL | Status: DC
Start: 2022-01-01 — End: 2022-01-03
  Administered 2022-01-01 – 2022-01-03 (×3): 120 mg via ORAL
  Filled 2022-01-01 (×4): qty 2

## 2022-01-01 MED ORDER — DEXTROSE 50 % IN WATER (D50W) INTRAVENOUS SYRINGE
INJECTION | INTRAVENOUS | Status: AC
Start: 2022-01-01 — End: 2022-01-01
  Filled 2022-01-01: qty 50

## 2022-01-01 MED ORDER — PANTOPRAZOLE 40 MG TABLET,DELAYED RELEASE
40.0000 mg | DELAYED_RELEASE_TABLET | Freq: Every day | ORAL | Status: DC
Start: 2022-01-01 — End: 2022-01-03
  Administered 2022-01-01 – 2022-01-03 (×3): 40 mg via ORAL
  Filled 2022-01-01 (×3): qty 1

## 2022-01-01 MED ORDER — MONTELUKAST 10 MG TABLET
10.0000 mg | ORAL_TABLET | Freq: Every evening | ORAL | Status: DC
Start: 2022-01-01 — End: 2022-01-03
  Administered 2022-01-01 – 2022-01-02 (×3): 10 mg via ORAL
  Filled 2022-01-01 (×3): qty 1

## 2022-01-01 MED ORDER — CLOPIDOGREL 75 MG TABLET
75.0000 mg | ORAL_TABLET | Freq: Every day | ORAL | Status: DC
Start: 2022-01-01 — End: 2022-01-03
  Administered 2022-01-01 – 2022-01-03 (×3): 75 mg via ORAL
  Filled 2022-01-01 (×3): qty 1

## 2022-01-01 MED ORDER — HYDROMORPHONE 2 MG/ML INJECTION WRAPPER
0.5000 mg | INJECTION | INTRAMUSCULAR | Status: AC | PRN
Start: 2022-01-01 — End: 2022-01-01
  Administered 2022-01-01 (×2): 0.5 mg via INTRAVENOUS
  Filled 2022-01-01 (×2): qty 1

## 2022-01-01 MED ORDER — INSULIN REGULAR HUMAN 100 UNIT/ML INJECTION SSIP
0.0000 [IU] | INJECTION | Freq: Four times a day (QID) | SUBCUTANEOUS | Status: DC | PRN
Start: 2022-01-01 — End: 2022-01-03

## 2022-01-01 MED ORDER — DOXAZOSIN 4 MG TABLET
4.0000 mg | ORAL_TABLET | Freq: Every evening | ORAL | Status: DC
Start: 2022-01-01 — End: 2022-01-03
  Administered 2022-01-01 – 2022-01-02 (×3): 4 mg via ORAL
  Filled 2022-01-01 (×3): qty 1

## 2022-01-01 MED ORDER — ATORVASTATIN 40 MG TABLET
80.0000 mg | ORAL_TABLET | Freq: Every evening | ORAL | Status: DC
Start: 2022-01-01 — End: 2022-01-03
  Administered 2022-01-01 – 2022-01-02 (×3): 80 mg via ORAL
  Filled 2022-01-01 (×3): qty 2

## 2022-01-01 MED ORDER — GUAIFENESIN 100 MG/5 ML ORAL LIQUID
200.0000 mg | ORAL | Status: DC | PRN
Start: 2022-01-01 — End: 2022-01-03

## 2022-01-01 MED ORDER — HYDROMORPHONE 2 MG/ML INJECTION WRAPPER
1.0000 mg | INJECTION | INTRAMUSCULAR | Status: AC
Start: 2022-01-01 — End: 2022-01-01
  Administered 2022-01-01: 1 mg via INTRAVENOUS

## 2022-01-01 MED ORDER — SODIUM CHLORIDE 0.9 % INTRAVENOUS SOLUTION
500.0000 mg | INTRAVENOUS | Status: DC
Start: 2022-01-01 — End: 2022-01-03
  Administered 2022-01-01: 500 mg via INTRAVENOUS
  Administered 2022-01-01 – 2022-01-02 (×2): 0 mg via INTRAVENOUS
  Filled 2022-01-01: qty 5

## 2022-01-01 MED ORDER — BUDESONIDE-FORMOTEROL HFA 160 MCG-4.5 MCG/ACTUATION AEROSOL INHALER
2.0000 | INHALATION_SPRAY | Freq: Two times a day (BID) | RESPIRATORY_TRACT | Status: DC
Start: 2022-01-01 — End: 2022-01-03
  Administered 2022-01-01: 0 via RESPIRATORY_TRACT
  Administered 2022-01-01 – 2022-01-02 (×3): 2 via RESPIRATORY_TRACT
  Administered 2022-01-03: 0 via RESPIRATORY_TRACT

## 2022-01-01 MED ORDER — CINACALCET 30 MG TABLET
30.0000 mg | ORAL_TABLET | Freq: Every evening | ORAL | Status: DC
Start: 2022-01-01 — End: 2022-01-03
  Administered 2022-01-01 – 2022-01-02 (×3): 30 mg via ORAL
  Filled 2022-01-01 (×4): qty 1

## 2022-01-01 MED ORDER — IPRATROPIUM 0.5 MG-ALBUTEROL 3 MG (2.5 MG BASE)/3 ML NEBULIZATION SOLN
3.0000 mL | INHALATION_SOLUTION | RESPIRATORY_TRACT | Status: DC | PRN
Start: 2022-01-01 — End: 2022-01-03
  Administered 2022-01-01: 3 mL via RESPIRATORY_TRACT

## 2022-01-01 MED ORDER — METOCLOPRAMIDE 5 MG/ML INJECTION SOLUTION
10.0000 mg | INTRAMUSCULAR | Status: AC
Start: 2022-01-01 — End: 2022-01-01
  Administered 2022-01-01: 10 mg via INTRAVENOUS

## 2022-01-01 MED ORDER — HYDROMORPHONE 2 MG/ML INJECTION WRAPPER
INJECTION | INTRAMUSCULAR | Status: AC
Start: 2022-01-01 — End: 2022-01-01
  Filled 2022-01-01: qty 1

## 2022-01-01 MED ORDER — ACETAMINOPHEN 325 MG TABLET
650.0000 mg | ORAL_TABLET | ORAL | Status: DC | PRN
Start: 2022-01-01 — End: 2022-01-03

## 2022-01-01 MED ORDER — GLUCAGON 1 MG/ML SOLUTION FOR INJECTION
1.0000 mg | INTRAMUSCULAR | Status: DC | PRN
Start: 2022-01-01 — End: 2022-01-03

## 2022-01-01 MED ORDER — METOCLOPRAMIDE 5 MG/ML INJECTION SOLUTION
INTRAMUSCULAR | Status: AC
Start: 2022-01-01 — End: 2022-01-01
  Filled 2022-01-01: qty 2

## 2022-01-01 MED ORDER — FAMOTIDINE 40 MG TABLET
40.0000 mg | ORAL_TABLET | Freq: Every day | ORAL | Status: DC
Start: 2022-01-01 — End: 2022-01-03
  Administered 2022-01-01 – 2022-01-03 (×3): 40 mg via ORAL
  Filled 2022-01-01 (×3): qty 1

## 2022-01-01 MED ORDER — DEXTROSE 50 % IN WATER (D50W) INTRAVENOUS SYRINGE
25.0000 g | INJECTION | INTRAVENOUS | Status: DC | PRN
Start: 2022-01-01 — End: 2022-01-03

## 2022-01-01 MED ORDER — LABETALOL 200 MG TABLET
400.0000 mg | ORAL_TABLET | Freq: Three times a day (TID) | ORAL | Status: DC
Start: 2022-01-01 — End: 2022-01-03
  Administered 2022-01-01: 400 mg via ORAL
  Administered 2022-01-01 (×2): 0 mg via ORAL
  Administered 2022-01-02 – 2022-01-03 (×4): 400 mg via ORAL
  Administered 2022-01-03: 0 mg via ORAL
  Filled 2022-01-01 (×5): qty 2

## 2022-01-01 MED ORDER — ONDANSETRON HCL (PF) 4 MG/2 ML INJECTION SOLUTION
INTRAMUSCULAR | Status: AC
Start: 2022-01-01 — End: 2022-01-01
  Filled 2022-01-01: qty 2

## 2022-01-01 MED ORDER — IPRATROPIUM 0.5 MG-ALBUTEROL 3 MG (2.5 MG BASE)/3 ML NEBULIZATION SOLN
3.0000 mL | INHALATION_SOLUTION | Freq: Four times a day (QID) | RESPIRATORY_TRACT | Status: DC | PRN
Start: 2022-01-01 — End: 2022-01-03

## 2022-01-01 MED ORDER — FUROSEMIDE 40 MG TABLET
160.0000 mg | ORAL_TABLET | Freq: Every day | ORAL | Status: DC
Start: 2022-01-01 — End: 2022-01-03
  Administered 2022-01-01 – 2022-01-03 (×3): 160 mg via ORAL
  Filled 2022-01-01 (×3): qty 4

## 2022-01-01 MED ORDER — CALCITRIOL 0.25 MCG CAPSULE
1.0000 ug | ORAL_CAPSULE | ORAL | Status: DC
Start: 2022-01-01 — End: 2022-01-03
  Administered 2022-01-01 – 2022-01-03 (×2): 1 ug via ORAL
  Filled 2022-01-01 (×2): qty 4

## 2022-01-01 MED ORDER — CLONIDINE HCL 0.2 MG TABLET
0.2000 mg | ORAL_TABLET | Freq: Two times a day (BID) | ORAL | Status: DC
Start: 2022-01-01 — End: 2022-01-03
  Administered 2022-01-01 – 2022-01-03 (×5): 0.2 mg via ORAL
  Filled 2022-01-01 (×5): qty 1

## 2022-01-01 MED ORDER — HYDRALAZINE 20 MG/ML INJECTION SOLUTION
10.0000 mg | Freq: Four times a day (QID) | INTRAMUSCULAR | Status: DC | PRN
Start: 2022-01-01 — End: 2022-01-03
  Administered 2022-01-01: 10 mg via INTRAVENOUS
  Filled 2022-01-01: qty 1

## 2022-01-01 MED ORDER — CALCIUM ACETATE(PHOSPHATE BINDERS) 667 MG CAPSULE
1334.0000 mg | ORAL_CAPSULE | Freq: Three times a day (TID) | ORAL | Status: DC
Start: 2022-01-01 — End: 2022-01-03
  Administered 2022-01-01 (×3): 0 mg via ORAL
  Administered 2022-01-02: 1334 mg via ORAL
  Administered 2022-01-02 – 2022-01-03 (×4): 0 mg via ORAL
  Filled 2022-01-01 (×2): qty 2

## 2022-01-01 MED ORDER — DEXTROSE 50 % IN WATER (D50W) INTRAVENOUS SYRINGE
12.5000 g | INJECTION | INTRAVENOUS | Status: DC | PRN
Start: 2022-01-01 — End: 2022-01-03

## 2022-01-01 MED ORDER — ETHYL ALCOHOL 62 % (NOZIN NASAL SANITIZER) NASAL SOLUTION - BULK BOTTLE
1.0000 | Freq: Two times a day (BID) | NASAL | Status: DC
Start: 2022-01-01 — End: 2022-01-03
  Administered 2022-01-01 – 2022-01-03 (×4): 1 via NASAL

## 2022-01-01 MED ORDER — AMLODIPINE 10 MG TABLET
10.0000 mg | ORAL_TABLET | Freq: Every day | ORAL | Status: DC
Start: 2022-01-01 — End: 2022-01-03
  Administered 2022-01-01 – 2022-01-03 (×3): 10 mg via ORAL
  Filled 2022-01-01 (×3): qty 1

## 2022-01-01 MED ORDER — ACETAMINOPHEN 325 MG TABLET
650.0000 mg | ORAL_TABLET | ORAL | Status: DC | PRN
Start: 2022-01-01 — End: 2022-01-03
  Filled 2022-01-01: qty 2

## 2022-01-01 MED ORDER — CYCLOBENZAPRINE 10 MG TABLET
5.0000 mg | ORAL_TABLET | Freq: Two times a day (BID) | ORAL | Status: DC | PRN
Start: 2022-01-01 — End: 2022-01-03
  Administered 2022-01-01: 5 mg via ORAL
  Filled 2022-01-01: qty 1

## 2022-01-01 MED ORDER — EPOETIN ALFA-EPBX 10,000 UNIT/ML INJECTION SOLUTION
10000.0000 [IU] | INTRAMUSCULAR | Status: DC
Start: 2022-01-01 — End: 2022-01-03
  Administered 2022-01-01 – 2022-01-03 (×3): 10000 [IU] via INTRAVENOUS
  Filled 2022-01-01 (×3): qty 1

## 2022-01-01 MED ORDER — TRAMADOL 50 MG TABLET
50.0000 mg | ORAL_TABLET | Freq: Three times a day (TID) | ORAL | Status: DC | PRN
Start: 2022-01-01 — End: 2022-01-03
  Administered 2022-01-01: 0 mg via ORAL
  Administered 2022-01-01 – 2022-01-02 (×2): 50 mg via ORAL
  Filled 2022-01-01 (×3): qty 1

## 2022-01-01 MED ORDER — HYDRALAZINE 50 MG TABLET
100.0000 mg | ORAL_TABLET | Freq: Three times a day (TID) | ORAL | Status: DC
Start: 2022-01-01 — End: 2022-01-03
  Administered 2022-01-01 – 2022-01-02 (×6): 100 mg via ORAL
  Administered 2022-01-03: 0 mg via ORAL
  Administered 2022-01-03: 100 mg via ORAL
  Filled 2022-01-01 (×7): qty 2

## 2022-01-01 MED ORDER — HYDROMORPHONE 2 MG/ML INJECTION WRAPPER
0.5000 mg | INJECTION | INTRAMUSCULAR | Status: AC
Start: 2022-01-01 — End: 2022-01-01
  Administered 2022-01-01: 0.5 mg via INTRAVENOUS
  Filled 2022-01-01: qty 1

## 2022-01-01 MED ORDER — PROCHLORPERAZINE EDISYLATE 10 MG/2 ML (5 MG/ML) INJECTION SOLUTION
10.0000 mg | Freq: Four times a day (QID) | INTRAMUSCULAR | Status: DC | PRN
Start: 2022-01-01 — End: 2022-01-03
  Administered 2022-01-01: 10 mg via INTRAVENOUS
  Filled 2022-01-01: qty 2

## 2022-01-01 MED ORDER — SODIUM CHLORIDE 0.9 % INTRAVENOUS PIGGYBACK
1.0000 g | INTRAVENOUS | Status: DC
Start: 2022-01-01 — End: 2022-01-03
  Administered 2022-01-01: 0 g via INTRAVENOUS
  Administered 2022-01-01: 1 g via INTRAVENOUS
  Administered 2022-01-02: 0 g via INTRAVENOUS
  Filled 2022-01-01: qty 10

## 2022-01-01 NOTE — Nurses Notes (Signed)
CN Emily aware of patient's BP 184/120 and complaints of chest pain, Dr. Tamala Julian made aware. Seeing patient at bedside and placing new orders, see MAR. Giving patient home dose of BP medications, and pain medication then rechecking.

## 2022-01-01 NOTE — Consults (Signed)
[facility]  Palliative Care Nurse Practitioner  Consult Note    [name], [name], 46 y.o. female  Date of Birth:  1976-02-17  MRN: [id number]  Admit Date: 12/31/2021   Attending: Hospitalist  Cam Hai, FNP   Reason for Consult: Goal Coordination    Chief Complaint:  Shortness of breath    HPI:  [name] is a 46 y.o. female who presented to the ED with increased SOB and fever.  She started having shortness of breath and fever on Monday during dialysis.  Chest x-ray- mild vascular congestion with ill-defined infiltrates within both perihilar regions as well as the right lung base.  WBC 12.1, H/H 9.5/29.5, Na 128, K+ 5.5, Bun 90, Cr 15.75, Trop i 79.  Patient admitted with pneumonia, HTN, ESRD, CAD.  Patient tx with IV abx, pulmonology consult, nephrology consult for dialysis management.      Subjective:  Patient seen while in dialysis.  Patient states that she is having some shortness of breath but states it is better than when she was admitted.  Patient on 5L oxymask with Sat 98%.    Review of Systems:  ROS: Other than ROS in the HPI, all other systems were negative.    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    Tricuspid valve regurgitation     Uncontrolled hypertension          Past Surgical History:   Procedure Laterality Date    CORONARY ARTERY ANGIOPLASTY      ESOPHAGOGASTRODUODENOSCOPY      HX BACK SURGERY      HX CHOLECYSTECTOMY      HX FOOT SURGERY Right     HX HYSTERECTOMY      HX TONSILLECTOMY            Family Medical History:       Problem Relation (Age of Onset)     Breast Cancer Mother    Coronary Artery Disease Mother    Diabetes type II Mother    Hypertension (High Blood Pressure) Father            Social Determinants of Health     Financial Resource Strain: No   Transportation Needs: None   Social Connections: Family interaction   Intimate Partner Violence: None   Housing Stability: Has housing     Social History     Socioeconomic History    Marital status: Divorced   Tobacco Use    Smoking status: Every Day     Packs/day: 1.00     Years: 10.00     Additional pack years: 0.00     Total pack years: 10.00     Types: Cigarettes     Passive exposure: Past    Smokeless tobacco: Former    Tobacco comments:     "Haven't had a cigerette in 2 weeks."   Vaping Use    Vaping Use: Never  used   Substance and Sexual Activity    Alcohol use: Not Currently    Drug use: Yes     Types: Marijuana    Sexual activity: Not Currently     Social Determinants of Health     Social Connections: Medium Risk (01/01/2022)    Social Connections     SDOH Social Isolation: 1 or 2 times a week       Current Outpatient Medications   Medication Instructions    acetaminophen (TYLENOL) 650 mg, Oral, EVERY 4 HOURS PRN    amLODIPine (NORVASC) 10 mg, Oral, DAILY, Take one tablet every day    budesonide-formoteroL (SYMBICORT) 160-4.5 mcg/actuation Inhalation oral inhaler 2 Puffs, Inhalation, 2 TIMES DAILY    calcitrioL (ROCALTROL) 1 mcg, Oral, EVERY MO, WE AND FR    cinacalcet (SENSIPAR) 30 mg, Oral, EVERY EVENING    cloNIDine (CATAPRES-TTS) 0.3 mg, Transdermal, EVERY 7 DAYS    cloNIDine HCL (CATAPRES) 0.2 mg, Oral, 2 TIMES DAILY, Take one tablet twice a day    clopidogreL (PLAVIX) 75 mg, Oral, DAILY    cyclobenzaprine (FLEXERIL) 5 mg, Oral, 2 TIMES DAILY PRN    doxazosin (CARDURA) 4 mg, Oral, EVERY EVENING    famotidine (PEPCID) 40 mg, Oral, DAILY    furosemide (LASIX) 160 mg, Oral, DAILY, Take after dialysis on dialysis days; otherwise take in morning daily.<BR>    hydrALAZINE (APRESOLINE) 100 mg, Oral, EVERY 8  HOURS (SCHEDULED)    ipratropium-albuterol 0.5 mg-3 mg(2.5 mg base)/3 mL Solution for Nebulization 3 mL, Nebulization, 4 TIMES DAILY PRN    Isosorbide Mononitrate (IMDUR) 120 mg, Oral, EVERY MORNING    isosorbide mononitrate (IMDUR) 60 mg, Oral, EVERY MORNING    labetaloL (NORMODYNE) 400 mg, Oral, 3 TIMES DAILY    losartan (COZAAR) 100 mg, Oral, DAILY    montelukast (SINGULAIR) 10 mg, Oral, EVERY EVENING    nitroGLYCERIN (NITROSTAT) 0.4 mg, Sublingual, EVERY 5 MIN PRN, for 3 doses over 15 minutes    omeprazole (PRILOSEC) 40 mg, Oral, DAILY    ondansetron (ZOFRAN ODT) 4 mg, Oral, EVERY 8 HOURS PRN    pantoprazole (PROTONIX) 40 mg, Oral, DAILY    rosuvastatin (CRESTOR) 40 mg, Oral, EVERY EVENING, Take one tablet every day    traMADoL (ULTRAM) 50 mg, Oral, EVERY 6 HOURS PRN      Allergies   Allergen Reactions    Lisinopril Swelling     Tongue and throat swelling    Cardene [Nicardipine]  Other Adverse Reaction (Add comment)     Chest pain     Oxycodone Itching        Physical Exam:  Constitutional: no distress  Respiratory: decreased breath sounds bilaterally  Cardiovascular: regular rate and rhythm  Gastrointestinal: Soft, non-tender, Bowel sounds normal, non-distended  Extremities: No edema in lower extremities  Integumentary:  Skin warm and dry, Permacath to right upper chest  Neurologic: Alert and oriented x3    BP (!) 137/111   Pulse (!) 111   Temp 36.5 C (97.7 F)   Resp 20   Ht 1.702 m (5\' 7" )   Wt 85.7 kg (189 lb)   SpO2 98%   BMI 29.60 kg/m        Pain: Numeric 0     Labs:  Lab Results Today:    Results for orders placed or performed during the hospital encounter of 12/31/21 (from the past 24 hour(s))   ECG 12 LEAD   Result Value Ref Range    Ventricular rate  86 BPM    Atrial Rate 86 BPM    PR Interval 212 ms    QRS Duration 108 ms    QT Interval 402 ms    QTC Calculation 481 ms    Calculated P Axis 56 degrees    Calculated R Axis 87 degrees    Calculated T Axis 4 degrees   COVID-19, FLU A/B, RSV  RAPID BY PCR   Result Value Ref Range    SARS-CoV-2 Not Detected Not Detected    INFLUENZA VIRUS TYPE A Not Detected Not Detected    INFLUENZA VIRUS TYPE B Not Detected Not Detected    RESPIRATORY SYNCTIAL VIRUS (RSV) Not Detected Not Detected   COMPREHENSIVE METABOLIC PANEL, NON-FASTING   Result Value Ref Range    SODIUM 128 (L) 136 - 145 mmol/L    POTASSIUM 5.5 (H) 3.5 - 5.1 mmol/L    CHLORIDE 89 (L) 98 - 107 mmol/L    CO2 TOTAL 20 (L) 21 - 32 mmol/L    ANION GAP 19 (H) 4 - 13 mmol/L    BUN 98 (H) 7 - 18 mg/dL    CREATININE 15.75 (H) 0.55 - 1.02 mg/dL    BUN/CREA RATIO 6     ESTIMATED GFR 3 (L) >59 mL/min/1.29m^2    ALBUMIN 3.4 3.4 - 5.0 g/dL    CALCIUM 9.9 8.5 - 10.1 mg/dL    GLUCOSE 84 74 - 106 mg/dL    ALKALINE PHOSPHATASE 96 46 - 116 U/L    ALT (SGPT) 9 <=78 U/L    AST (SGOT) 17 15 - 37 U/L    BILIRUBIN TOTAL 0.6 0.2 - 1.0 mg/dL    PROTEIN TOTAL 8.3 (H) 6.4 - 8.2 g/dL    ALBUMIN/GLOBULIN RATIO 0.7 (L) 0.8 - 1.4    OSMOLALITY, CALCULATED 287 270 - 290 mOsm/kg    CALCIUM, CORRECTED 10.4 mg/dL    GLOBULIN 4.9    LACTIC ACID LEVEL W/ REFLEX FOR LEVEL >2.0   Result Value Ref Range    LACTIC ACID 0.4 0.4 - 2.0 mmol/L   CBC WITH DIFF   Result Value Ref Range    WBC 12.1 (H) 4.0 - 10.5 x10^3/uL    RBC 3.25 (L) 4.20 - 5.40 x10^6/uL    HGB 9.5 (L) 12.5 - 16.0 g/dL    HCT 29.5 (L) 37.0 - 47.0 %    MCV 90.9 78.0 - 99.0 fL    MCH 29.2 27.0 - 32.0 pg    MCHC 32.2 32.0 - 36.0 g/dL    RDW 21.0 (H) 11.6 - 14.8 %    PLATELETS 350 140 - 440 x10^3/uL    MPV 8.2 7.4 - 10.4 fL    NEUTROPHIL % 86 (H) 40 - 76 %    LYMPHOCYTE % 6 (L) 25 - 45 %    MONOCYTE % 6 0 - 12 %    EOSINOPHIL % 1 0 - 7 %    BASOPHIL % 0 0 - 3 %    NEUTROPHIL # 10.34 (H) 1.80 - 8.40 x10^3/uL    LYMPHOCYTE # 0.77 (L) 1.10 - 5.00 x10^3/uL    MONOCYTE # 0.72 0.00 - 1.30 x10^3/uL    EOSINOPHIL # 0.17 0.00 - 0.80 x10^3/uL    BASOPHIL # 0.05 0.00 - 0.30 x10^3/uL   TROPONIN NOW   Result Value Ref Range    TROPONIN I 79 (H) <15 ng/L   TROPONIN IN ONE HOUR   Result Value  Ref Range    TROPONIN I  79 (H) <15 ng/L   TROPONIN IN THREE HOURS   Result Value Ref Range    TROPONIN I 79 (H) <15 ng/L   CBC - ONCE   Result Value Ref Range    WBC 12.8 (H) 3.8 - 11.8 x10^3/uL    RBC 3.27 (L) 3.63 - 4.92 x10^6/uL    HGB 9.3 (L) 10.9 - 14.3 g/dL    HCT 29.0 (L) 31.2 - 41.9 %    MCV 88.9 75.5 - 95.3 fL    MCH 28.3 24.7 - 32.8 pg    MCHC 31.9 (L) 32.3 - 35.6 g/dL    RDW 21.4 (H) 12.3 - 17.7 %    PLATELETS 345 140 - 440 x10^3/uL    MPV 7.0 (L) 7.9 - 10.8 fL   COMPREHENSIVE METABOLIC PANEL, NON-FASTING   Result Value Ref Range    SODIUM 128 (L) 136 - 145 mmol/L    POTASSIUM 6.9 (HH) 3.5 - 5.1 mmol/L    CHLORIDE 92 (L) 98 - 107 mmol/L    CO2 TOTAL 20 (L) 21 - 31 mmol/L    ANION GAP 16 (H) 4 - 13 mmol/L    BUN 96 (H) 7 - 25 mg/dL    CREATININE 15.17 (H) 0.60 - 1.30 mg/dL    BUN/CREA RATIO 6 6 - 22    ESTIMATED GFR 3 (L) >59 mL/min/1.54m^2    ALBUMIN 4.4 3.5 - 5.7 g/dL    CALCIUM 10.1 8.6 - 10.3 mg/dL    GLUCOSE 149 (H) 74 - 109 mg/dL    ALKALINE PHOSPHATASE 89 34 - 104 U/L    ALT (SGPT) 8 7 - 52 U/L    AST (SGOT) 14 13 - 39 U/L    BILIRUBIN TOTAL 0.5 0.3 - 1.2 mg/dL    PROTEIN TOTAL 8.3 6.4 - 8.9 g/dL    ALBUMIN/GLOBULIN RATIO 1.1 0.8 - 1.4    OSMOLALITY, CALCULATED 290 270 - 290 mOsm/kg    CALCIUM, CORRECTED 9.8 8.9 - 10.8 mg/dL    GLOBULIN 3.9 2.9 - 5.4   MAGNESIUM - ONCE   Result Value Ref Range    MAGNESIUM 2.3 1.9 - 2.7 mg/dL   POC BLOOD GLUCOSE (RESULTS)   Result Value Ref Range    GLUCOSE, POC 125 50 - 500 mg/dl       Imaging Studies:    Images and Reports reviewed to current date.      ACP:  The patient confirmed her MPOAs are Murriel Hopper and Prudence Davidson.  A copy is on file in EPIC.    Code Status:  Patient wishes to remain a full code.    GOC:  Patient lives at home alone.  She states that she has a Insurance underwriter through [employer] M-F for 3.5hrs each day.  She states that her worker assists her with all ADL's and will transport her to appointments.  She states she also uses the city bus for  transportation.  She states she has all the DME she needs at home.  She uses the power chair for mobility.  We reviewed current condition and plan of care.  Education and complications of COPD, CHF, ESRD discussed.  Patient states that she used to be on oxygen @ 2L NC but her oxygen has been increased to 4L after she had a sleep study completed.  She uses inhaler and nebulizers for her COPD and follows with Dr. Linton Rump for pulmonology.  Patient states that she follows with Dr. Viann Shove for cardiology.  Patient states  that she goes to dialysis on M-W-F and follows with Dr. Liliane Shi.  Patient is currently on palliative services and would like for them to continue at discharge.    PPS: 50%      Persons present and participating in discussion: Patient      Was the conversation voluntary? Yes      Plan:  Continue community palliative services when discharged    Patient Disposition/Discharge needs:  Home    ACP/GOC time:     16 minutes    Total visit time:     41 minutes including ACP/GOC time, Face to face and reviewing medical records.    Thank you for this consult.    Kreg Shropshire, APRN,NP-C  Palliative Care Nurse Practitioner    This note may have been partially generated using Mmodal Fluency Direct system and there may be some incorrect words, spellings, and punctuation that were not noted in checking the note before saving.

## 2022-01-01 NOTE — ED Nurses Note (Signed)
Provider placed a central line in the right femoral. Pt tolerated well. Provider secured with stitches and clear tegaderm with antimicrobial square. Pt has some bloody drainage noted. All lines flushed without difficulty and all pull blood.

## 2022-01-01 NOTE — Consults (Signed)
[facility]   Nephrology Consult      [place], [place], 46 y.o. female  Encounter Start Date:  12/31/2021  Inpatient Admission Date:  01/01/2022  Date of Birth:  [birth year]    Admitting Provider:  Hospitalist Service    Reason for Consult:  End-stage renal disease on hemodialysis with hyperkalemia    HPI:  Jadelynn Boylan is a 46 y.o. female who presented to the ED with increased SOB and fever.  She started having shortness of breath and fever on Monday during dialysis.  Chest x-ray- mild vascular congestion with ill-defined infiltrates within both perihilar regions as well as the right lung base.  WBC 12.1, H/H 9.5/29.5, Na 128, K+ 5.5, Bun 90, Cr 15.75, Trop i 79.  Patient admitted with pneumonia, HTN, ESRD, CAD   Patient known to have end-stage renal disease on hemodialysis 3 times a week missed dialysis came in with shortness of breadth and hyperkalemia potassium 6.9 dialyzed today ultrafiltration 3 L dialyzed on a 1K bath still short of breath will arrange for dialysis tomorrow 2nd session with ultrafiltration aiming for 3 L tomorrow anemia of chronic kidney disease hemoglobin 9.3 will check anemia profile and PTH    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    Tricuspid valve regurgitation     Uncontrolled hypertension          Medications Prior to Admission       Prescriptions    acetaminophen (TYLENOL) 325 mg Oral Tablet    Take 2 Tablets (650 mg total) by mouth Every 4 hours as needed    amLODIPine (NORVASC) 10 mg Oral Tablet    Take 1 Tablet (10 mg total) by mouth Once a day  Take one tablet every day    budesonide-formoteroL (SYMBICORT) 160-4.5 mcg/actuation Inhalation oral inhaler    Take 2 Puffs by inhalation Twice daily    calcitrioL (ROCALTROL) 0.5 mcg Oral Capsule    Take 2 Capsules (1 mcg total) by mouth Every Monday, Wednesday and Friday    calcium acetate,phosphat bind, (PHOSLO) 667 mg Oral Capsule    Take 2 Capsules (1,334 mg total) by mouth Three times daily with meals for 30 days    cinacalcet (SENSIPAR) 30 mg Oral Tablet    Take 1 Tablet (30 mg total) by mouth Every evening    cloNIDine (CATAPRES-TTS) 0.3 mg/24 hr Transdermal Patch Weekly    Place 1 Patch (0.3 mg total) on the skin Every 7 days    cloNIDine HCL (CATAPRES) 0.2 mg Oral Tablet    Take 1 Tablet (0.2 mg total) by mouth Twice daily Take one tablet twice a day    clopidogreL (PLAVIX) 75 mg Oral Tablet    Take 1 Tablet (75 mg total) by mouth Once a day    cyclobenzaprine (FLEXERIL) 5 mg Oral Tablet    Take 1 Tablet (5 mg total) by mouth Twice per day as  needed for Muscle spasms    doxazosin (CARDURA) 4 mg Oral Tablet    Take 1 Tablet (4 mg total) by mouth Every evening    epoetin alfa-epbx (RETACRIT) 10,000 unit/mL Injection Solution    Infuse 1 mL (10,000 Units total) into a venous catheter Give in Dialysis for 30 days    famotidine (PEPCID) 40 mg Oral Tablet    Take 1 Tablet (40 mg total) by mouth Once a day    furosemide (LASIX) 80 mg Oral Tablet    Take 2 Tablets (160 mg total) by mouth Once a day Take after dialysis on dialysis days; otherwise take in morning daily.    hydrALAZINE (APRESOLINE) 100 mg Oral Tablet    Take 1 Tablet (100 mg total) by mouth Every 8 hours for 30 days    ipratropium-albuterol 0.5 mg-3 mg(2.5 mg base)/3 mL Solution for Nebulization    Take 3 mL by nebulization Four times a day as needed    isosorbide mononitrate (IMDUR) 120 mg Oral Tablet Sustained Release 24 hr    Take 1 Tablet (120 mg total) by mouth Every morning for 30 days    isosorbide mononitrate (IMDUR) 60 mg Oral Tablet  Sustained Release 24 hr    Take 1 Tablet (60 mg total) by mouth Every morning    labetaloL (NORMODYNE) 200 mg Oral Tablet    Take 2 Tablets (400 mg total) by mouth Three times a day    losartan (COZAAR) 100 mg Oral Tablet    Take 1 Tablet (100 mg total) by mouth Once a day    montelukast (SINGULAIR) 10 mg Oral Tablet    Take 1 Tablet (10 mg total) by mouth Every evening    nitroGLYCERIN (NITROSTAT) 0.4 mg Sublingual Tablet, Sublingual    Place 1 Tablet (0.4 mg total) under the tongue Every 5 minutes as needed for Chest pain for up to 3 doses for 3 doses over 15 minutes    omeprazole (PRILOSEC) 40 mg Oral Capsule, Delayed Release(E.C.)    Take 1 Capsule (40 mg total) by mouth Once a day    ondansetron (ZOFRAN ODT) 4 mg Oral Tablet, Rapid Dissolve    Take 1 Tablet (4 mg total) by mouth Every 8 hours as needed for Nausea/Vomiting    pantoprazole (PROTONIX) 40 mg Oral Tablet, Delayed Release (E.C.)    Take 1 Tablet (40 mg total) by mouth Once a day    rosuvastatin (CRESTOR) 40 mg Oral Tablet    Take 1 Tablet (40 mg total) by mouth Every evening Take one tablet every day    traMADoL (ULTRAM) 50 mg Oral Tablet    Take 1 Tablet (50 mg total) by mouth Every 6 hours as needed for Pain          acetaminophen (TYLENOL) tablet, 650 mg, Oral, Q4H PRN  acetaminophen (TYLENOL) tablet, 650 mg, Oral, Q4H PRN  albuterol 90 mcg per inhalation oral inhaler - "Respiratory to administer", 2 Puff, Inhalation, Q6H PRN  alcohol 62 % (NOZIN NASAL SANITIZER) nasal solution, 1 Each, Each Nostril, 2x/day  amLODIPine (NORVASC) tablet, 10 mg, Oral, Daily  atorvastatin (LIPITOR) tablet, 80 mg, Oral, QPM  azithromycin (ZITHROMAX) 500 mg in NS 250 mL IVPB, 500 mg, Intravenous, Q24H  budesonide-formoterol (SYMBICORT) 160 mcg-4.5 mcg per inhalation oral inhaler - "Respiratory to administer", 2 Puff, Inhalation, 2x/day  calcitriol (ROCALTROL) capsule, 1 mcg, Oral, M, W, and F  calcium acetate (PHOSLO) capsule, 1,334 mg, Oral, 3x/day-Meals  cefTRIAXone  (ROCEPHIN) 1 g in  NS 50 mL IVPB minibag, 1 g, Intravenous, Q24H  cinacalcet (SENSIPAR) tablet, 30 mg, Oral, QPM  cloNIDine (CATAPRES) tablet, 0.2 mg, Oral, 2x/day  clopidogrel (PLAVIX) 75 mg tablet, 75 mg, Oral, Daily  cyclobenzaprine (FLEXERIL) tablet, 5 mg, Oral, 2x/day PRN  dextrose 50% (0.5 g/mL) injection - syringe, 25 g, Intravenous, Q15 Min PRN   Or  dextrose 50% (0.5 g/mL) injection - syringe, 12.5 g, Intravenous, Q15 Min PRN   Or  glucagon (GLUCAGEN DIAGNOSTIC KIT) injection 1 mg, 1 mg, Subcutaneous, Q15 Min PRN   Or  glucagon (GLUCAGEN DIAGNOSTIC KIT) injection 1 mg, 1 mg, IntraMUSCULAR, Q15 Min PRN  doxazosin (CARDURA) tablet, 4 mg, Oral, QPM  epoetin alfa-epbx (RETACRIT) 10,000 units/mL injection, 10,000 Units, Intravenous, Give in Dialysis  famotidine (PEPCID) tablet, 40 mg, Oral, Daily  furosemide (LASIX) tablet, 160 mg, Oral, Daily  guaiFENesin 164m per 560moral liquid - for cough (expectorant), 200 mg, Oral, Q4H PRN  hydrALAZINE (APRESOLINE) injection 10 mg, 10 mg, Intravenous, Q6H PRN  hydrALAZINE (APRESOLINE) tablet, 100 mg, Oral, Q8HRS  HYDROmorphone (DILAUDID) 2 mg/mL injection, 0.5 mg, Intravenous, Q4H PRN  ipratropium-albuterol 0.5 mg-3 mg(2.5 mg base)/3 mL Solution for Nebulization, 3 mL, Nebulization, 4x/day PRN  ipratropium-albuterol 0.5 mg-3 mg(2.5 mg base)/3 mL Solution for Nebulization, 3 mL, Nebulization, Q4H PRN  isosorbide mononitrate (IMDUR) 24 hr extended release tablet, 120 mg, Oral, QAM  labetalol (NORMODYNE) tablet, 400 mg, Oral, 3x/day  montelukast (SINGULAIR) 10 mg tablet, 10 mg, Oral, QPM  NS flush syringe, 3 mL, Intracatheter, Q8HRS  NS flush syringe, 3 mL, Intracatheter, Q1H PRN  pantoprazole (PROTONIX) delayed release tablet, 40 mg, Oral, Daily  SSIP insulin R human (HumuLIN R) 100 units/mL injection, 0-12 Units, Subcutaneous, 4x/day PRN  traMADol (ULTRAM) tablet, 50 mg, Oral, Q8H PRN      Allergies   Allergen Reactions    Lisinopril Swelling     Tongue and throat swelling     Cardene [Nicardipine]  Other Adverse Reaction (Add comment)     Chest pain     Oxycodone Itching       ROS:   Constitutional: negative for fevers, chills, sweats, and fatigue  Eyes: negative for visual disturbance, irritation, redness, and icterus  Ears, nose, mouth, throat, and face: negative for hearing loss, tinnitus, ear drainage, nasal congestion, epistaxis, and sore throat  Respiratory: negative for cough, sputum, hemoptysis, wheezing, or dyspnea on exertion  Cardiovascular: negative for chest pain, palpitations, , orthopnea, paroxysmal nocturnal dyspnea, and lower extremity edema  Gastrointestinal: negative for  nausea, vomiting, melena, diarrhea, constipation, and abdominal pain  Genitourinary:negative for frequency, dysuria, nocturia, urinary incontinence, hesitancy, and hematuria  Integument/breast: negative for rash, skin lesion(s), and pruritus  Hematologic/lymphatic: negative for easy bruising, bleeding, and petechiae  Musculoskeletal:negative for myalgias, arthralgias, neck pain, back pain, and muscle weakness  Neurological: negative for headaches, dizziness, seizures, speech problems, tremor, and weakness  Behavioral/Psych: negative for anxiety, behavior problems, mood swings, and sleep disturbance  Endocrine: negative for temperature intolerance  Allergic/Immunologic: negative for urticaria and angioedema      EXAM:  Filed Vitals:    01/01/22 0631 01/01/22 0700 01/01/22 1143 01/01/22 1220   BP:  (!) 176/101  (!) 137/111   Pulse: 85  (!) 105 (!) 111   Resp:    (!) 22   Temp:    36.8 C (98.2 F)   SpO2:  98%         Constitutional: Alert and Oriented time person and place . Not in acute  distress  HEENT : Vision and hearing grossly normal   Eyes: Conjunctiva clear., Pupils equal and round, reactive to light and accomodation. , Sclera non-icteric.   ENT: Nose without erythema. , Mouth mucous membranes moist.   Neck: JVD negative no thyromegaly or lymphadenopathy and supple, symmetrical, trachea  midline  Respiratory: Clear to auscultation bilaterally. No wheezes, No rales, Good air exchange bilaterally  Cardiovascular: regular rate and rhythm no murmurs no rub  Gastrointestinal: Soft, non-tender, Bowel sounds normal, No hepatosplenomegaly  Genitourinary: no suprapubic tenderness  Lower Extremities : No edema . Pulses + 4 bilateral   Neurologic: Grossly normal. Speech clear.   Psychiatric: Normal mood and affect,   Labs:         CBC Results Differential Results   Recent Labs     12/31/21  2238 01/01/22  0629   WBC 12.1* 12.8*   HGB 9.5* 9.3*   HCT 29.5* 29.0*    Recent Labs     12/31/21  2238   PMNS 86*   LYMPHOCYTES 6*   MONOCYTES 6   EOSINOPHIL 1   BASOPHILS 0  0.05   PMNABS 10.34*   LYMPHSABS 0.77*   MONOSABS 0.72   EOSABS 0.17      BMP Results Other Chemistries Results   Recent Labs     12/31/21  2238 01/01/22  0629   SODIUM 128* 128*   POTASSIUM 5.5* 6.9*   CHLORIDE 89* 92*   CO2 20* 20*   BUN 98* 96*   CREATININE 15.75* 15.17*   GFR 3* 3*   ANIONGAP 19* 16*    Recent Labs     12/31/21  2238 01/01/22  0629   CALCIUM 9.9 10.1   ALBUMIN 3.4 4.4   MAGNESIUM  --  2.3      Liver/Pancreas Enzyme Results Blood Gas     Recent Labs     12/31/21  2238 01/01/22  0629   TOTALPROTEIN 8.3* 8.3   ALBUMIN 3.4 4.4   AST 17 14   ALT 9 8   ALKPHOS 96 89    No results found for this encounter   Cardiac Results    Coags Results     TROPONIN I  Lab Results   Component Value Date    UHCEASTTROPI 0.06 ([place]) 12/13/2020    TROPONINI 79 (H) 01/01/2022    TROPONINI 79 (H) 01/01/2022    TROPONINI 79 (H) 12/31/2021         No results for input(s): "INR" in the last 72 hours.    Invalid input(s): "PTT", "PT"       Imaging Studies:    XR CHEST AP   Final Result   Mild vascular congestion with ill-defined infiltrates within both perihilar regions as well as the right lung base.      Stable mild enlargement of the cardiac silhouette.         Radiologist location ID: [id number]              Assessment/Plan:  Active Hospital Problems     Diagnosis    Primary Problem: Pneumonia    Hypertension    Hyperkalemia    Anemia in chronic kidney disease    Coronary artery disease involving native coronary artery    End-stage renal disease on hemodialysis (CMS HCC)      End-stage renal disease hemodialysis 3 times a week missed dialysis came in short of breath fever cough started on antibiotics dialyzed today with ultrafiltration 3 L next dialysis  tomorrow also another session  Anemia chronic kidney disease check anemia profile and PTH  Hyperkalemia dialyzed on a 1K bath      Richardean Sale, MD

## 2022-01-01 NOTE — Care Plan (Signed)
POC reviewed with pt. Pt agrees to POC including medications and monitoring. Pt denies further needs. NAD.   Problem: Adult Inpatient Plan of Care  Goal: Plan of Care Review  Outcome: Ongoing (see interventions/notes)  Goal: Patient-Specific Goal (Individualized)  Outcome: Ongoing (see interventions/notes)  Goal: Absence of Hospital-Acquired Illness or Injury  Outcome: Ongoing (see interventions/notes)  Intervention: Prevent and Manage VTE (Venous Thromboembolism) Risk  Recent Flowsheet Documentation  Taken 01/01/2022 1100 by Concepcion Living, RN  VTE Prevention/Management:   ambulation promoted   anticoagulant therapy maintained  Goal: Optimal Comfort and Wellbeing  Outcome: Ongoing (see interventions/notes)  Goal: Rounds/Family Conference  Outcome: Ongoing (see interventions/notes)     Problem: Health Knowledge, Opportunity to Enhance (Adult,Obstetrics,Pediatric)  Goal: Knowledgeable about Health Subject/Topic  Description: Patient will demonstrate the desired outcomes by discharge/transition of care.  Outcome: Ongoing (see interventions/notes)     Problem: Pain Acute  Goal: Optimal Pain Control and Function  Outcome: Ongoing (see interventions/notes)

## 2022-01-01 NOTE — Nurses Notes (Signed)
Rechecked BP at bedside shift report with offgoing RN. BP was 176/101. Pt currently sitting up in bed, wearing oxymask, falling asleep while talking. Pt able to wake up and sign consent for HD. Fell asleep when RN stopped talking to pt.

## 2022-01-01 NOTE — ED Nurses Note (Signed)
Pt complains of chest pain mid sternal. Rating a 10/10 that radiates to her back. Provider notified. Pt states that she has had chest pain on and off for the last approx 2 days. Resp even and non labored. Equal chest rise and fall. Call bell in reach. Cardiac monitor in place. Central line in place and secured.

## 2022-01-01 NOTE — Nurses Notes (Signed)
Pt sitting up in bed, appears asleep. Pt's eyes are closed, and is snoring at this time. NAD.

## 2022-01-01 NOTE — H&P (Signed)
[facility]  History and Physical    Date of Service:  01/01/2022  Dawn, Malone, 46 y.o. female  Encounter Start Date:  12/31/2021  Inpatient Admission Date: 01/01/2022  Date of Birth:  January 15, 1976  PCP: Cam Hai, FNP       Chief Complaint:  Increased SOB/Fever     HPI: [name] is a 46 y.o., 55 American female who presents to [facility] from [facility] with complaints of increased SOB and fever.  Patient was seen examined at bedside.  Patient is well known to hospitalist service.  Patient states she started having increased shortness of breath and fever on Monday during dialysis.  Patient has a history of ESRD and is on dialysis Monday, Wednesday, Friday.  Patient also complaining of her chronic chest pain.  Patient was just discharged from this facility on October 23rd.  Patient was negative for COVID in the ED. patient did state that her last dialysis treatment was on Monday.  Patient had imaging scans in the ED that was suggestive of pneumonia.  Patient denies any other complaints at this time.  Patient will be admitted to hospitalist services for further evaluation.    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    Tricuspid valve regurgitation     Uncontrolled hypertension              Medications Prior to Admission       Prescriptions    acetaminophen (TYLENOL) 325 mg Oral Tablet    Take 2 Tablets (650 mg total) by mouth Every 4 hours as needed    amLODIPine (NORVASC) 10 mg Oral Tablet    Take 1 Tablet (10 mg total) by mouth Once a day Take one  tablet every day    budesonide-formoteroL (SYMBICORT) 160-4.5 mcg/actuation Inhalation oral inhaler    Take 2 Puffs by inhalation Twice daily    calcitrioL (ROCALTROL) 0.5 mcg Oral Capsule    Take 2 Capsules (1 mcg total) by mouth Every Monday, Wednesday and Friday    calcium acetate,phosphat bind, (PHOSLO) 667 mg Oral Capsule    Take 2 Capsules (1,334 mg total) by mouth Three times daily with meals for 30 days    cinacalcet (SENSIPAR) 30 mg Oral Tablet    Take 1 Tablet (30 mg total) by mouth Every evening    cloNIDine (CATAPRES-TTS) 0.3 mg/24 hr Transdermal Patch Weekly    Place 1 Patch (0.3 mg total) on the skin Every 7 days    cloNIDine HCL (CATAPRES) 0.2 mg Oral Tablet    Take 1 Tablet (0.2 mg total) by mouth Twice daily Take one tablet twice a day    clopidogreL (PLAVIX) 75 mg Oral Tablet    Take 1 Tablet (75 mg total) by mouth Once a day    cyclobenzaprine (FLEXERIL) 5 mg Oral Tablet    Take 1 Tablet (5 mg total)  by mouth Twice per day as needed for Muscle spasms    doxazosin (CARDURA) 4 mg Oral Tablet    Take 1 Tablet (4 mg total) by mouth Every evening    epoetin alfa-epbx (RETACRIT) 10,000 unit/mL Injection Solution    Infuse 1 mL (10,000 Units total) into a venous catheter Give in Dialysis for 30 days    famotidine (PEPCID) 40 mg Oral Tablet    Take 1 Tablet (40 mg total) by mouth Once a day    furosemide (LASIX) 80 mg Oral Tablet    Take 2 Tablets (160 mg total) by mouth Once a day Take after dialysis on dialysis days; otherwise take in morning daily.    hydrALAZINE (APRESOLINE) 100 mg Oral Tablet    Take 1 Tablet (100 mg total) by mouth Every 8 hours for 30 days    ipratropium-albuterol 0.5 mg-3 mg(2.5 mg base)/3 mL Solution for Nebulization    Take 3 mL by nebulization Four times a day as needed    isosorbide mononitrate (IMDUR) 120 mg Oral Tablet Sustained Release 24 hr    Take 1 Tablet (120 mg total) by mouth Every morning for 30 days    isosorbide mononitrate (IMDUR) 60 mg Oral Tablet Sustained  Release 24 hr    Take 1 Tablet (60 mg total) by mouth Every morning    labetaloL (NORMODYNE) 200 mg Oral Tablet    Take 2 Tablets (400 mg total) by mouth Three times a day    losartan (COZAAR) 100 mg Oral Tablet    Take 1 Tablet (100 mg total) by mouth Once a day    montelukast (SINGULAIR) 10 mg Oral Tablet    Take 1 Tablet (10 mg total) by mouth Every evening    nitroGLYCERIN (NITROSTAT) 0.4 mg Sublingual Tablet, Sublingual    Place 1 Tablet (0.4 mg total) under the tongue Every 5 minutes as needed for Chest pain for up to 3 doses for 3 doses over 15 minutes    omeprazole (PRILOSEC) 40 mg Oral Capsule, Delayed Release(E.C.)    Take 1 Capsule (40 mg total) by mouth Once a day    ondansetron (ZOFRAN ODT) 4 mg Oral Tablet, Rapid Dissolve    Take 1 Tablet (4 mg total) by mouth Every 8 hours as needed for Nausea/Vomiting    pantoprazole (PROTONIX) 40 mg Oral Tablet, Delayed Release (E.C.)    Take 1 Tablet (40 mg total) by mouth Once a day    rosuvastatin (CRESTOR) 40 mg Oral Tablet    Take 1 Tablet (40 mg total) by mouth Every evening Take one tablet every day    traMADoL (ULTRAM) 50 mg Oral Tablet    Take 1 Tablet (50 mg total) by mouth Every 6 hours as needed for Pain          Allergies   Allergen Reactions    Lisinopril Swelling     Tongue and throat swelling    Cardene [Nicardipine]  Other Adverse Reaction (Add comment)     Chest pain     Oxycodone Itching       Past Surgical History:  Past Surgical History:   Procedure Laterality Date    CORONARY ARTERY ANGIOPLASTY      ESOPHAGOGASTRODUODENOSCOPY      HX BACK SURGERY      HX CHOLECYSTECTOMY      HX FOOT SURGERY Right     HX HYSTERECTOMY      HX TONSILLECTOMY  Family History:  Family Medical History:       Problem Relation (Age of Onset)    Breast Cancer Mother    Coronary Artery Disease Mother    Diabetes type II Mother    Hypertension (High Blood Pressure) Father               Social History:  Social History     Tobacco Use    Smoking status: Every Day      Packs/day: 1.00     Years: 10.00     Additional pack years: 0.00     Total pack years: 10.00     Types: Cigarettes     Passive exposure: Past    Smokeless tobacco: Former    Tobacco comments:     "Haven't had a cigerette in 2 weeks."   Vaping Use    Vaping Use: Never used   Substance Use Topics    Alcohol use: Not Currently    Drug use: Yes     Types: Marijuana        Review of Systems:  All systems are reviewed and are negative except those mentioned in the HPI portion    Examination:  BP (!) 183/112   Pulse 90   Temp 37.2 C (99 F)   Resp 16   Ht 1.702 m (5\' 7" )   Wt 85.7 kg (189 lb)   SpO2 97%   BMI 29.60 kg/m         General: Patient is alert and oriented to person, place and time.    HEENT: Pupils are of round shape, equal in size, and reactive to light bilaterally. Oral mucous membranes are moist.    Heart: S1 and S2 are present. No appreciable murmur.    Lungs: Breath sounds are appreciated at all posterior lung fields, rales and wheezing noted.  Patient is tachypneic.    Gastrointestinal: Bowel sounds are appreciated at all 4 quadrants. Abdomen is soft, not appreciably distended, non-tender to palpation at all quadrants.    Extremities: Radial pulses are 3/4 bilaterally, dorsalis pedis pulses are 3/4 bilaterally. Capillary refill is less than 3 seconds at distal digits bilaterally. No appreciable edema of the lower extremities.    Genitourinary: No appreciable suprapubic tenderness.    Neurologic: Follows commands appropriately. No appreciable facial droop. No appreciable focal weakness of the bilateral upper or lower extremities.    Skin: Grossly intact at observable areas.    Labs:    Lab Results Today:    Results for orders placed or performed during the hospital encounter of 12/31/21 (from the past 24 hour(s))   ECG 12 LEAD   Result Value Ref Range    Ventricular rate 86 BPM    Atrial Rate 86 BPM    PR Interval 212 ms    QRS Duration 108 ms    QT Interval 402 ms    QTC Calculation 481 ms     Calculated P Axis 56 degrees    Calculated R Axis 87 degrees    Calculated T Axis 4 degrees   COVID-19, FLU A/B, RSV RAPID BY PCR   Result Value Ref Range    SARS-CoV-2 Not Detected Not Detected    INFLUENZA VIRUS TYPE A Not Detected Not Detected    INFLUENZA VIRUS TYPE B Not Detected Not Detected    RESPIRATORY SYNCTIAL VIRUS (RSV) Not Detected Not Detected   COMPREHENSIVE METABOLIC PANEL, NON-FASTING   Result Value Ref Range    SODIUM 128 (L) 136 -  145 mmol/L    POTASSIUM 5.5 (H) 3.5 - 5.1 mmol/L    CHLORIDE 89 (L) 98 - 107 mmol/L    CO2 TOTAL 20 (L) 21 - 32 mmol/L    ANION GAP 19 (H) 4 - 13 mmol/L    BUN 98 (H) 7 - 18 mg/dL    CREATININE 15.75 (H) 0.55 - 1.02 mg/dL    BUN/CREA RATIO 6     ESTIMATED GFR 3 (L) >59 mL/min/1.47m^2    ALBUMIN 3.4 3.4 - 5.0 g/dL    CALCIUM 9.9 8.5 - 10.1 mg/dL    GLUCOSE 84 74 - 106 mg/dL    ALKALINE PHOSPHATASE 96 46 - 116 U/L    ALT (SGPT) 9 <=78 U/L    AST (SGOT) 17 15 - 37 U/L    BILIRUBIN TOTAL 0.6 0.2 - 1.0 mg/dL    PROTEIN TOTAL 8.3 (H) 6.4 - 8.2 g/dL    ALBUMIN/GLOBULIN RATIO 0.7 (L) 0.8 - 1.4    OSMOLALITY, CALCULATED 287 270 - 290 mOsm/kg    CALCIUM, CORRECTED 10.4 mg/dL    GLOBULIN 4.9    LACTIC ACID LEVEL W/ REFLEX FOR LEVEL >2.0   Result Value Ref Range    LACTIC ACID 0.4 0.4 - 2.0 mmol/L   CBC WITH DIFF   Result Value Ref Range    WBC 12.1 (H) 4.0 - 10.5 x10^3/uL    RBC 3.25 (L) 4.20 - 5.40 x10^6/uL    HGB 9.5 (L) 12.5 - 16.0 g/dL    HCT 29.5 (L) 37.0 - 47.0 %    MCV 90.9 78.0 - 99.0 fL    MCH 29.2 27.0 - 32.0 pg    MCHC 32.2 32.0 - 36.0 g/dL    RDW 21.0 (H) 11.6 - 14.8 %    PLATELETS 350 140 - 440 x10^3/uL    MPV 8.2 7.4 - 10.4 fL    NEUTROPHIL % 86 (H) 40 - 76 %    LYMPHOCYTE % 6 (L) 25 - 45 %    MONOCYTE % 6 0 - 12 %    EOSINOPHIL % 1 0 - 7 %    BASOPHIL % 0 0 - 3 %    NEUTROPHIL # 10.34 (H) 1.80 - 8.40 x10^3/uL    LYMPHOCYTE # 0.77 (L) 1.10 - 5.00 x10^3/uL    MONOCYTE # 0.72 0.00 - 1.30 x10^3/uL    EOSINOPHIL # 0.17 0.00 - 0.80 x10^3/uL    BASOPHIL # 0.05 0.00 - 0.30  x10^3/uL   TROPONIN NOW   Result Value Ref Range    TROPONIN I 79 (H) <15 ng/L   TROPONIN IN ONE HOUR   Result Value Ref Range    TROPONIN I 79 (H) <15 ng/L   TROPONIN IN THREE HOURS   Result Value Ref Range    TROPONIN I 79 (H) <15 ng/L       Imaging Studies:  Results for orders placed or performed during the hospital encounter of 12/31/21 (from the past 24 hour(s))   XR CHEST AP     Status: None    Narrative    Jalexia Aguino    RADIOLOGIST: Diana Eves, MD    XR CHEST AP performed on 12/31/2021 10:25 PM    CLINICAL HISTORY: cough congestion sob.Marland Kitchen  FEVER, COUGH, CONGESTION    TECHNIQUE: Frontal view of the chest.    COMPARISON:  12/16/2021    FINDINGS:  Dual-lumen right internal tragus intravenous catheter is unchanged in position. Metallic stent is again noted overlying  the region of the distal superior vena cava. Metallic stent is again noted within the left axillary region as well.   There is stable mild enlargement of the cardiac silhouette. There is mild vascular congestion.   There are ill-defined infiltrates within both perihilar regions as well as the right lung base. No large effusion.           Impression    Mild vascular congestion with ill-defined infiltrates within both perihilar regions as well as the right lung base.    Stable mild enlargement of the cardiac silhouette.      Radiologist location ID: [id number]          Assessment/Plan:   Active Hospital Problems    Diagnosis    Primary Problem: Pneumonia    Hypertension    Anemia in chronic kidney disease    Coronary artery disease involving native coronary artery    End-stage renal disease on hemodialysis (CMS HCC)     Plan to admit patient MIP.  Telemetry, continuous pulse ox, and supplemental oxygen titration p.r.n. will be ordered.  Will verify patient's home medications.  Patient will have SCDs ordered for DVT prophylaxis.  IV Rocephin and IV Zithromax will be ordered for antimicrobial therapy.  Patient on breathing treatments and cough  syrup as needed for increased shortness of breath and cough.  Patient on p.o. Tylenol available for fever.  Plan to consult Nephrology, patient has dialysis Monday Wednesday Friday.  Plan repeat labs in a.m.Marland Kitchen  See attending addendum and orders for further information.    DVT/PE Prophylaxis: SCDs/ Venodynes/Impulse boots    Gardner Candle, FNP-BC    Contents of the document, in whole or in part, are completed utilizing M*Modal dictation technology, please forgive any typographical errors that may exist.

## 2022-01-01 NOTE — Nurses Notes (Signed)
Pt sitting up in bed rocking and crying. Pt stopped crying long enough to tell this RN that she was not taking the tramadol because it does not work. Pt stated "I don't know why they do this to me. The only thing that works is dilaudid". Pt asked a 2nd time if she wants the tramadol the doctor ordered from her home med list. Pt stated "No, it doesn't help. This is why I hate when Dr Liliane Shi isn't here, he orders my medications right". Pt asked for dialysis nurse Shiree to be called. Pt then stated her chest pain went through to her back and wanted this RN to rub her back. CN notified of the above.

## 2022-01-01 NOTE — Consults (Signed)
[facility]        PULMONARY MEDICINE CONSULTATION NOTE    [name], [name], 46 y.o. female  Date of Birth:  January 08, 1976  Encounter Start Date:  12/31/2021  Inpatient Admission Date: 01/01/2022  Date of service: 01/01/2022    Service: Pulmonary Medicine  Requesting MD: Dr Corie Chiquito    Reason for consultation:  Pneumonia, hypoxemia    HPI:  [name] is a 46 y.o. female end-stage renal disease dialysis dependent very noncompliant recently quit smoking already on continuous home oxygen therapy currently in the hospital because of worsening shortness of a does have cough with whitish expectoration without any fever chills or chest pain, denies any trouble swallowing choking, currently on 5 L oxygen pulse ox is 98%, being treated with antibiotics.    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    Tricuspid valve regurgitation     Uncontrolled hypertension      Past Surgical History:   Procedure Laterality Date    CORONARY ARTERY ANGIOPLASTY      ESOPHAGOGASTRODUODENOSCOPY      HX BACK SURGERY      HX CHOLECYSTECTOMY      HX FOOT SURGERY Right     HX HYSTERECTOMY      HX TONSILLECTOMY           Allergies   Allergen Reactions    Lisinopril Swelling     Tongue and throat swelling    Cardene [Nicardipine]  Other Adverse Reaction (Add comment)     Chest pain     Oxycodone Itching     Family History  Family Medical History:       Problem Relation (Age of Onset)    Breast Cancer Mother    Coronary Artery Disease Mother    Diabetes type II Mother    Hypertension (High  Blood Pressure) Father           Social History  Social History     Tobacco Use    Smoking status: Every Day     Packs/day: 1.00     Years: 10.00     Additional pack years: 0.00     Total pack years: 10.00     Types: Cigarettes     Passive exposure: Past    Smokeless tobacco: Former    Tobacco comments:     "Haven't had a cigerette in 2 weeks."   Vaping Use    Vaping Use: Never used   Substance Use Topics    Alcohol use: Not Currently    Drug use: Yes     Types: Marijuana            Medications Prior to Admission       Prescriptions    acetaminophen (TYLENOL) 325 mg Oral Tablet    Take 2 Tablets (650 mg total) by mouth Every 4 hours as needed  amLODIPine (NORVASC) 10 mg Oral Tablet    Take 1 Tablet (10 mg total) by mouth Once a day Take one tablet every day    budesonide-formoteroL (SYMBICORT) 160-4.5 mcg/actuation Inhalation oral inhaler    Take 2 Puffs by inhalation Twice daily    calcitrioL (ROCALTROL) 0.5 mcg Oral Capsule    Take 2 Capsules (1 mcg total) by mouth Every Monday, Wednesday and Friday    calcium acetate,phosphat bind, (PHOSLO) 667 mg Oral Capsule    Take 2 Capsules (1,334 mg total) by mouth Three times daily with meals for 30 days    cinacalcet (SENSIPAR) 30 mg Oral Tablet    Take 1 Tablet (30 mg total) by mouth Every evening    cloNIDine (CATAPRES-TTS) 0.3 mg/24 hr Transdermal Patch Weekly    Place 1 Patch (0.3 mg total) on the skin Every 7 days    cloNIDine HCL (CATAPRES) 0.2 mg Oral Tablet    Take 1 Tablet (0.2 mg total) by mouth Twice daily Take one tablet twice a day    clopidogreL (PLAVIX) 75 mg Oral Tablet    Take 1 Tablet (75 mg total) by mouth Once a day    cyclobenzaprine (FLEXERIL) 5 mg Oral Tablet    Take 1 Tablet (5 mg total) by mouth Twice per day as needed for Muscle spasms    doxazosin (CARDURA) 4 mg Oral Tablet    Take 1 Tablet (4 mg total) by mouth Every evening    epoetin alfa-epbx (RETACRIT) 10,000 unit/mL Injection Solution    Infuse 1 mL (10,000 Units total) into a venous  catheter Give in Dialysis for 30 days    famotidine (PEPCID) 40 mg Oral Tablet    Take 1 Tablet (40 mg total) by mouth Once a day    furosemide (LASIX) 80 mg Oral Tablet    Take 2 Tablets (160 mg total) by mouth Once a day Take after dialysis on dialysis days; otherwise take in morning daily.    hydrALAZINE (APRESOLINE) 100 mg Oral Tablet    Take 1 Tablet (100 mg total) by mouth Every 8 hours for 30 days    ipratropium-albuterol 0.5 mg-3 mg(2.5 mg base)/3 mL Solution for Nebulization    Take 3 mL by nebulization Four times a day as needed    isosorbide mononitrate (IMDUR) 120 mg Oral Tablet Sustained Release 24 hr    Take 1 Tablet (120 mg total) by mouth Every morning for 30 days    isosorbide mononitrate (IMDUR) 60 mg Oral Tablet Sustained Release 24 hr    Take 1 Tablet (60 mg total) by mouth Every morning    labetaloL (NORMODYNE) 200 mg Oral Tablet    Take 2 Tablets (400 mg total) by mouth Three times a day    losartan (COZAAR) 100 mg Oral Tablet    Take 1 Tablet (100 mg total) by mouth Once a day    montelukast (SINGULAIR) 10 mg Oral Tablet    Take 1 Tablet (10 mg total) by mouth Every evening    nitroGLYCERIN (NITROSTAT) 0.4 mg Sublingual Tablet, Sublingual    Place 1 Tablet (0.4 mg total) under the tongue Every 5 minutes as needed for Chest pain for up to 3 doses for 3 doses over 15 minutes    omeprazole (PRILOSEC) 40 mg Oral Capsule, Delayed Release(E.C.)    Take 1 Capsule (40 mg total) by mouth Once a day    ondansetron (ZOFRAN ODT) 4 mg Oral Tablet, Rapid Dissolve    Take 1 Tablet (  4 mg total) by mouth Every 8 hours as needed for Nausea/Vomiting    pantoprazole (PROTONIX) 40 mg Oral Tablet, Delayed Release (E.C.)    Take 1 Tablet (40 mg total) by mouth Once a day    rosuvastatin (CRESTOR) 40 mg Oral Tablet    Take 1 Tablet (40 mg total) by mouth Every evening Take one tablet every day    traMADoL (ULTRAM) 50 mg Oral Tablet    Take 1 Tablet (50 mg total) by mouth Every 6 hours as needed for Pain           acetaminophen (TYLENOL) tablet, 650 mg, Oral, Q4H PRN  acetaminophen (TYLENOL) tablet, 650 mg, Oral, Q4H PRN  albuterol 90 mcg per inhalation oral inhaler - "Respiratory to administer", 2 Puff, Inhalation, Q6H PRN  alcohol 62 % (NOZIN NASAL SANITIZER) nasal solution, 1 Each, Each Nostril, 2x/day  amLODIPine (NORVASC) tablet, 10 mg, Oral, Daily  atorvastatin (LIPITOR) tablet, 80 mg, Oral, QPM  azithromycin (ZITHROMAX) 500 mg in NS 250 mL IVPB, 500 mg, Intravenous, Q24H  budesonide-formoterol (SYMBICORT) 160 mcg-4.5 mcg per inhalation oral inhaler - "Respiratory to administer", 2 Puff, Inhalation, 2x/day  calcitriol (ROCALTROL) capsule, 1 mcg, Oral, M, W, and F  calcium acetate (PHOSLO) capsule, 1,334 mg, Oral, 3x/day-Meals  cefTRIAXone (ROCEPHIN) 1 g in NS 50 mL IVPB minibag, 1 g, Intravenous, Q24H  cinacalcet (SENSIPAR) tablet, 30 mg, Oral, QPM  cloNIDine (CATAPRES) tablet, 0.2 mg, Oral, 2x/day  clopidogrel (PLAVIX) 75 mg tablet, 75 mg, Oral, Daily  cyclobenzaprine (FLEXERIL) tablet, 5 mg, Oral, 2x/day PRN  dextrose 50% (0.5 g/mL) injection - syringe, 25 g, Intravenous, Q15 Min PRN   Or  dextrose 50% (0.5 g/mL) injection - syringe, 12.5 g, Intravenous, Q15 Min PRN   Or  glucagon (GLUCAGEN DIAGNOSTIC KIT) injection 1 mg, 1 mg, Subcutaneous, Q15 Min PRN   Or  glucagon (GLUCAGEN DIAGNOSTIC KIT) injection 1 mg, 1 mg, IntraMUSCULAR, Q15 Min PRN  doxazosin (CARDURA) tablet, 4 mg, Oral, QPM  epoetin alfa-epbx (RETACRIT) 10,000 units/mL injection, 10,000 Units, Intravenous, Give in Dialysis  famotidine (PEPCID) tablet, 40 mg, Oral, Daily  furosemide (LASIX) tablet, 160 mg, Oral, Daily  guaiFENesin 174m per 51moral liquid - for cough (expectorant), 200 mg, Oral, Q4H PRN  hydrALAZINE (APRESOLINE) injection 10 mg, 10 mg, Intravenous, Q6H PRN  hydrALAZINE (APRESOLINE) tablet, 100 mg, Oral, Q8HRS  HYDROmorphone (DILAUDID) 2 mg/mL injection, 0.5 mg, Intravenous, Q4H PRN  ipratropium-albuterol 0.5 mg-3 mg(2.5 mg base)/3 mL  Solution for Nebulization, 3 mL, Nebulization, 4x/day PRN  ipratropium-albuterol 0.5 mg-3 mg(2.5 mg base)/3 mL Solution for Nebulization, 3 mL, Nebulization, Q4H PRN  isosorbide mononitrate (IMDUR) 24 hr extended release tablet, 120 mg, Oral, QAM  labetalol (NORMODYNE) tablet, 400 mg, Oral, 3x/day  montelukast (SINGULAIR) 10 mg tablet, 10 mg, Oral, QPM  NS flush syringe, 3 mL, Intracatheter, Q8HRS  NS flush syringe, 3 mL, Intracatheter, Q1H PRN  pantoprazole (PROTONIX) delayed release tablet, 40 mg, Oral, Daily  SSIP insulin R human (HumuLIN R) 100 units/mL injection, 0-12 Units, Subcutaneous, 4x/day PRN  traMADol (ULTRAM) tablet, 50 mg, Oral, Q8H PRN      Active Orders   Imaging    XR AP MOBILE CHEST     Frequency: ONE TIME     Number of Occurrences: 1 Occurrences     Scheduling Instructions:               Lab    BASIC METABOLIC PANEL, NON-FASTING     Frequency: 0530 - AM  DRAW     Number of Occurrences: 1 Occurrences    CBC/DIFF     Frequency: 0530 - AM DRAW     Number of Occurrences: 1 Occurrences    FOLATE     Frequency: 0530 - AM DRAW     Number of Occurrences: 1 Occurrences    IRON TRANSFERRIN AND TIBC     Frequency: 0530 - AM DRAW     Number of Occurrences: 1 Occurrences    MAGNESIUM     Frequency: 0530 - AM DRAW     Number of Occurrences: 1 Occurrences    PARATHYROID HORMONE (PTH)     Frequency: 0530 - AM DRAW     Number of Occurrences: 1 Occurrences    VITAMIN B12     Frequency: 0530 - AM DRAW     Number of Occurrences: 1 Occurrences   Diet    DIET CARDIAC (2G NA, LOWFAT, LOW CHOL) Do you want to initiate MNT Protocol? Yes; Additional modifications/limitations: RENAL     Frequency: All Meals     Number of Occurrences: 1 Occurrences   Nursing    ACTIVITY Activity: AS TOLERATED; Instructions: WITH ASSIST; Weight bearing limits: NO WEIGHT-BEARING LIMITATIONS     Frequency: UNTIL DISCONTINUED     Number of Occurrences: Until Specified    APPLY SEQUENTIAL COMPRESSION DEVICE     Frequency: ONE TIME     Number of  Occurrences: 1 Occurrences    INTAKE AND OUTPUT QSHIFT     Frequency: QSHIFT     Number of Occurrences: Until Specified    MAINTAIN SEQUENTIAL COMPRESSION DEVICE     Frequency: CONTINUOUS X 72 HRS     Number of Occurrences: 72 Hours    MISCELLANEOUS MD/DO TO NURSE     Frequency: ONE TIME     Number of Occurrences: 1 Occurrences     Order Comments: Notify physician when patient arrives to [facility], thank you      Notify MD Vital Signs     Frequency: PRN     Number of Occurrences: Until Specified    PT IS INTERMEDIATE RISK FOR VENOUS THROMBOEMBOLISM     Frequency: CONTINUOUS     Number of Occurrences: Until Specified    PULSE OXIMETRY Q4H     Frequency: Q4H     Number of Occurrences: Until Specified    TELEMETRY MONITORING - Continuous     Frequency: CONTINUOUS     Number of Occurrences: Until Specified    VITAL SIGNS  Q4H     Frequency: Q4H     Number of Occurrences: Until Specified    WEIGH PATIENT PRE AND POST DIALYSIS TREATMENT     Frequency: EVERY OTHER DAY     Number of Occurrences: Until Specified   Consult    IP CONSULT TO NEPHROLOGY Requested Provider; EL-KHATIB, ABBAS     Frequency: ONE TIME     Number of Occurrences: 1 Occurrences    IP CONSULT TO PALLIATIVE CARE     Frequency: ONE TIME     Number of Occurrences: 1 Occurrences    IP CONSULT TO PULMONOLOGY Requested Provider; Posey Pronto, VISHNU     Frequency: ONE TIME     Number of Occurrences: 1 Occurrences   Point of Care Testing    PERFORM POC WHOLE BLOOD GLUCOSE     Frequency: TID AC & HS     Number of Occurrences: Until Specified   IV    INSERT & MAINTAIN PERIPHERAL IV ACCESS  Frequency: UNTIL DISCONTINUED     Number of Occurrences: Until Specified    PERIPHERAL IV DRESSING CHANGE     Frequency: PRN     Number of Occurrences: Until Specified   Dialysis    HEMODIALYSIS     Frequency: ONE TIME     Number of Occurrences: 1 Occurrences   Medications    acetaminophen (TYLENOL) tablet     Frequency: Q4H PRN     Dose: 650 mg     Route: Oral    acetaminophen (TYLENOL)  tablet     Frequency: Q4H PRN     Dose: 650 mg     Route: Oral    albuterol 90 mcg per inhalation oral inhaler - "Respiratory to administer"     Frequency: Q6H PRN     Dose: 2 Puff     Route: Inhalation    alcohol 62 % (NOZIN NASAL SANITIZER) nasal solution     Frequency: 2x/day     Dose: 1 Each     Route: Each Nostril    amLODIPine (NORVASC) tablet     Frequency: Daily     Dose: 10 mg     Route: Oral    atorvastatin (LIPITOR) tablet     Frequency: QPM     Dose: 80 mg     Route: Oral    azithromycin (ZITHROMAX) 500 mg in NS 250 mL IVPB     Frequency: Q24H     Dose: 500 mg     Route: Intravenous    budesonide-formoterol (SYMBICORT) 160 mcg-4.5 mcg per inhalation oral inhaler - "Respiratory to administer"     Frequency: 2x/day     Dose: 2 Puff     Route: Inhalation    calcitriol (ROCALTROL) capsule     Frequency: M, W, and F     Dose: 1 mcg     Route: Oral    calcium acetate (PHOSLO) capsule     Frequency: 3x/day-Meals     Dose: 1,334 mg     Route: Oral    cefTRIAXone (ROCEPHIN) 1 g in NS 50 mL IVPB minibag     Frequency: Q24H     Dose: 1 g     Route: Intravenous    cinacalcet (SENSIPAR) tablet     Frequency: QPM     Dose: 30 mg     Route: Oral    cloNIDine (CATAPRES) tablet     Frequency: 2x/day     Dose: 0.2 mg     Route: Oral    clopidogrel (PLAVIX) 75 mg tablet     Frequency: Daily     Dose: 75 mg     Route: Oral    cyclobenzaprine (FLEXERIL) tablet     Frequency: 2x/day PRN     Dose: 5 mg     Route: Oral    dextrose 50% (0.5 g/mL) injection - syringe     Linked Order: Or     Frequency: Q15 Min PRN     Dose: 25 g     Route: Intravenous    dextrose 50% (0.5 g/mL) injection - syringe     Linked Order: Or     Frequency: Q15 Min PRN     Dose: 12.5 g     Route: Intravenous    doxazosin (CARDURA) tablet     Frequency: QPM     Dose: 4 mg     Route: Oral    epoetin alfa-epbx (RETACRIT) 10,000 units/mL injection     Frequency: Give in  Dialysis     Dose: 10,000 Units     Route: Intravenous    famotidine (PEPCID) tablet      Frequency: Daily     Dose: 40 mg     Route: Oral    furosemide (LASIX) tablet     Frequency: Daily     Dose: 160 mg     Route: Oral    glucagon (GLUCAGEN DIAGNOSTIC KIT) injection 1 mg     Linked Order: Or     Frequency: Q15 Min PRN     Dose: 1 mg     Route: Subcutaneous    glucagon (GLUCAGEN DIAGNOSTIC KIT) injection 1 mg     Linked Order: Or     Frequency: Q15 Min PRN     Dose: 1 mg     Route: IntraMUSCULAR    guaiFENesin 174m per 586moral liquid - for cough (expectorant)     Frequency: Q4H PRN     Dose: 200 mg     Route: Oral    hydrALAZINE (APRESOLINE) injection 10 mg     Frequency: Q6H PRN     Dose: 10 mg     Route: Intravenous    hydrALAZINE (APRESOLINE) tablet     Frequency: Q8HRS     Dose: 100 mg     Route: Oral    HYDROmorphone (DILAUDID) 2 mg/mL injection     Frequency: Q4H PRN     Dose: 0.5 mg     Route: Intravenous    ipratropium-albuterol 0.5 mg-3 mg(2.5 mg base)/3 mL Solution for Nebulization     Frequency: 4x/day PRN     Dose: 3 mL     Route: Nebulization    ipratropium-albuterol 0.5 mg-3 mg(2.5 mg base)/3 mL Solution for Nebulization     Frequency: Q4H PRN     Dose: 3 mL     Route: Nebulization    isosorbide mononitrate (IMDUR) 24 hr extended release tablet     Frequency: QAM     Dose: 120 mg     Route: Oral    labetalol (NORMODYNE) tablet     Frequency: 3x/day     Dose: 400 mg     Route: Oral    montelukast (SINGULAIR) 10 mg tablet     Frequency: QPM     Dose: 10 mg     Route: Oral    NS flush syringe     Frequency: Q8HRS     Dose: 3 mL     Route: Intracatheter    NS flush syringe     Frequency: Q1H PRN     Dose: 3 mL     Route: Intracatheter    pantoprazole (PROTONIX) delayed release tablet     Frequency: Daily     Dose: 40 mg     Route: Oral    SSIP insulin R human (HumuLIN R) 100 units/mL injection     Frequency: 4x/day PRN     Dose: 0-12 Units     Route: Subcutaneous    traMADol (ULTRAM) tablet     Frequency: Q8H PRN     Dose: 50 mg     Route: Oral        ROS:  GENERAL: The patient denies any  weight change, dizziness.  SKIN: No rashes or sores.  HEAD: No trauma, no headache. No dizziness.  EYES: No blurriness, no acute visual loss.  EARS: No hearing loss, no tinnitus.  THROAT: No bleeding gums, no sore throat. No drainage.  CARDIAC: As per  HPI.  RESPIRATORY: As per HPI.  GI: No nausea, vomiting or diarrhea. No abdominal pain.  GU: No polyuria, no dysuria. No urgency.  MUSCULOSKELETAL: No muscle weakness   EXTREMITIES: No swelling noted.  NEUROLOGICAL: No numbness, tingling or tremors.  HEMATOLOGICAL: No easy bruising or bleeding.      EXAM:  Temperature: 36.5 C (97.7 F)  Heart Rate: 93  BP (Non-Invasive): (!) 169/88  Respiratory Rate: 20  SpO2: (!) 87 %  Gen:  Awake oriented x3 without any obvious respiratory distress  Head:  Normocephalic/atraumatic  Eyes:  Pupils equally round and reactive light  ENT:  Membranes moist oropharynx free erythema and exudate or thrush.  No new lesions, rashes, or ulcerations.  Neck:  Supple, with normal range motion.  No adenopathy or thyromegaly  CV:  Regular rate and rhythm without murmurs rubs or gallops. Marland Kitchen  RESP:   Decreased air entry with scattered crackles  ABDOMEN:  Abdomen is soft, nontender, nondistended.  Bowel sounds normoactive.  NEURO:  Essentially unremarkable  EXTREMITIES:  No pitting edema cyanosis or clubbing      Studies:  I have reviewed all available studies within the electronic medical record.    Labs:      BMP:  BMP (Last 24 Hours):    Recent Results last 24 hours     12/31/21  2238 01/01/22  0629   SODIUM 128* 128*   POTASSIUM 5.5* 6.9*   CHLORIDE 89* 92*   CO2 20* 20*   BUN 98* 96*   CREATININE 15.75* 15.17*   CALCIUM 9.9 10.1   GLUCOSENF 84 149*       CBC Results Differential Results   Recent Labs     01/01/22  0629   WBC 12.8*   HGB 9.3*   HCT 29.0*   PLTCNT 345    Recent Results (from the past 30 hour(s))   CBC WITH DIFF    Collection Time: 12/31/21 10:38 PM   Result Value    WBC 12.1 (H)    NEUTROPHIL % 86 (H)    LYMPHOCYTE % 6 (L)     MONOCYTE % 6    EOSINOPHIL % 1    BASOPHIL % 0    BASOPHIL # 0.05        Hepatic Function:    Recent Labs     01/01/22  0629   TOTALPROTEIN 8.3   ALBUMIN 4.4   TOTBILIRUBIN 0.5   AST 14   ALT 8   ALKPHOS 89     PT:  No results found for this encounter  INR:   No results found for this encounter  PTT:   No results found for this encounter  Most Recent Cardiac Markers:    Recent Labs     01/01/22  0228   TROPONINI 73*     Blood Gas: No results found for this encounter  Lipid Panel:  No results found for this encounter  TSH:  No results found for this encounter    Imaging Studies:    Results for orders placed or performed during the hospital encounter of 12/31/21 (from the past 72 hour(s))   XR CHEST AP     Status: None    Narrative    Carlyon Halls    RADIOLOGIST: Diana Eves, MD    XR CHEST AP performed on 12/31/2021 10:25 PM    CLINICAL HISTORY: cough congestion sob.Marland Kitchen  FEVER, COUGH, CONGESTION    TECHNIQUE: Frontal view of the chest.  COMPARISON:  12/16/2021    FINDINGS:  Dual-lumen right internal tragus intravenous catheter is unchanged in position. Metallic stent is again noted overlying the region of the distal superior vena cava. Metallic stent is again noted within the left axillary region as well.   There is stable mild enlargement of the cardiac silhouette. There is mild vascular congestion.   There are ill-defined infiltrates within both perihilar regions as well as the right lung base. No large effusion.           Impression    Mild vascular congestion with ill-defined infiltrates within both perihilar regions as well as the right lung base.    Stable mild enlargement of the cardiac silhouette.      Radiologist location ID: [id number]         Echo: _0 @     Last Pathology/Cytology Result   SURGICAL PATHOLOGY SPECIMEN (Collected: 12/08/2020 12:51 PM)   Result Value Ref Range    Final Diagnosis       A) RIGHT BREAST SKIN, EXCISION:  - Skin with stromal edema. Negative for  neoplasm. See note.    Note: Step levels and cytokeratin AE1/3 IHC is performed and is negative for carcinoma cells. Neoplasm is not identified. Clinical correlation is recommended. This case was reviewed by a second pathologist as part of the [facility].      Gross Description       A: Skin  Specimen is received in formalin in one container labeled with the patient's name and "right breast skin", and consists of a 1.3 x 0.5 x 0.2 cm tan edematous skin excision and a 0.7 x 0.4 x 0.2 cm piece of tan white soft tissue. The skin is inked green and serially sectioned. All submitted in one cassette. (RM)  Removed from patient: 12/08/2020 12:51   Placed in formalin: 12/08/2020 12:54  Cold ischemia time: <0.1 hr  Formalin fixation time: 57.1 hr (RM)         DNR Status:  Prior    Assessment:   Active Hospital Problems    Diagnosis    Primary Problem: Pneumonia    Hypertension    Hyperkalemia    Bilateral pneumonia    Anemia in chronic kidney disease    Coronary artery disease involving native coronary artery    End-stage renal disease on hemodialysis (CMS HCC)       Assessment and Recommendations:  Chest x-ray is consistent with bibasilar atelectasis, pneumonitis and pleural effusion   Continue aggressive dialysis   I agree with current antibiotics   DuoNeb 4 times a day  Incentive spirometry   Early ambulation and physical therapy  Currently does not smoke   Repeat chest x-ray after dialysis tomorrow     Thank you for allowing me to participate in this patient's care, I will follow along with you during this hospital course, If you have  any questions please do not hesitate to contact me any time.  On the day of the encounter, a total of  55 minutes was spent on this patient encounter including review of historical information, examination, documentation and post-visit activities, reviewing radiological studies and discussion with nursing staff         Unk Lightning MD,FCCP,FASM  Board  Certified ,[facility]    This note has been created with voice recognition software.  Please excuse any errors in transcription.  Occasional wrong word or sound alike substitutions may have occurred due to the inherent limitations  of voice recognition software.  Please read the chart carefully and recognize using context with the substitutions may have occurred.  If you find any mistake or needs clarification please contact me any time

## 2022-01-01 NOTE — ED Nurses Note (Signed)
Pt resting in bed at this time. Resp even and non labored. Equal chest rise and fall. Cardiac monitor in place. Call bell in place.

## 2022-01-01 NOTE — Respiratory Therapy (Signed)
Scheduled (0900) MDI not administered. Patient not in room.

## 2022-01-01 NOTE — ED Nurses Note (Incomplete)
Pt stats that her chst is hurting.

## 2022-01-01 NOTE — ED Nurses Note (Signed)
Pt states that her chest pain has "eased up."

## 2022-01-01 NOTE — ED Nurses Note (Addendum)
Pt able to urinate approx 50cc of clear yellow urine. Pt states that her urine output has been decreased lately.

## 2022-01-01 NOTE — Nurses Notes (Signed)
Pt sitting up in bed, appears calm at this time, NAD. Pt states her pain is 8/10 at this time. Pt given PRN dialudid as ordered. Pt talkative with this RN.

## 2022-01-01 NOTE — ED Nurses Note (Signed)
Called report to [facility]. Spoke to [employer]. Pt alert and oriented at this time. Resp even and non labored. Equal chest rise and fall. [facility] in room to transport patient. Central line intact. Cardiac monitor in place. Pt leaving with belongings and paperwork.

## 2022-01-01 NOTE — Nurses Notes (Signed)
Patient's BP still elevated at 187/104. CN Emily aware. Administering PRN hydralazine at this time.

## 2022-01-01 NOTE — ED Nurses Note (Signed)
Pt complaining of mid sternal chest pain rated at 10/10 with nausea. Provider notified. New orders.

## 2022-01-01 NOTE — Progress Notes (Signed)
[name]    HOSPITALIST PROGRESS NOTE    [name]  Date of service: 01/01/2022  Date of Admission:  12/31/2021  Hospital Day:  LOS: 0 days     Subjective:   Patient seen while having hemodialysis.  He is awake alert reporting having some trouble breathing with cough.  No fever chills or rigors reported.  Blood pressure still uncontrolled which appears chronic for her.  No other new complaints reported.    Vital Signs:  Filed Vitals:    01/01/22 0518 01/01/22 0541 01/01/22 0631 01/01/22 0700   BP: (!) 184/120   (!) 176/101   Pulse: 98  85    Resp: 20      Temp: 36.5 C (97.7 F)      SpO2: 90% 92%  98%        Physical Exam:  General:  Patient in NAD, resting in bed, no visitors present  Head:  Normocephalic, atraumatic  Eyes:  PERRL, anicteric sclera  ENT:  Oral mucosa moist, no nasal discharge   Neck:  Soft, supple, trachea midline  Heart:  RRR, S1 and S2 normal  Lungs:  Unlabored respirations.  Lungs decreased breath sounds to auscultation bilaterally, with no wheezes, no rales, no conversational dyspnea  Abdomen:  Soft, active bowel sounds, non-tender to palpation, non-distended  Extremities:  Pulses equal bilaterally.  Capillary refill less than 3 seconds.  No edema in lower extremities bilaterally   Skin:  Warm and dry, not diaphoretic.  No ecchymosis noted.   Neuro:  A&O x 3.  No focal deficits.  Speech intact  Psych:  Cooperative, not agitated    Intake & Output:    Intake/Output Summary (Last 24 hours) at 01/01/2022 1102  Last data filed at 01/01/2022 0050  Gross per 24 hour   Intake 305 ml   Output --   Net 305 ml     I/O current shift:  No intake/output data recorded.  Emesis:    BM:    Date of Last Bowel Movement: 01/01/22  Heme:      acetaminophen (TYLENOL) tablet, 650 mg, Oral, Q4H PRN  acetaminophen (TYLENOL) tablet, 650 mg, Oral, Q4H PRN  albuterol 90 mcg per inhalation oral inhaler - "Respiratory to administer", 2 Puff, Inhalation, Q6H PRN  amLODIPine (NORVASC)  tablet, 10 mg, Oral, Daily  atorvastatin (LIPITOR) tablet, 80 mg, Oral, QPM  azithromycin (ZITHROMAX) 500 mg in NS 250 mL IVPB, 500 mg, Intravenous, Q24H  budesonide-formoterol (SYMBICORT) 160 mcg-4.5 mcg per inhalation oral inhaler - "Respiratory to administer", 2 Puff, Inhalation, 2x/day  calcitriol (ROCALTROL) capsule, 1 mcg, Oral, M, W, and F  calcium acetate (PHOSLO) capsule, 1,334 mg, Oral, 3x/day-Meals  cefTRIAXone (ROCEPHIN) 1 g in NS 50 mL IVPB minibag, 1 g, Intravenous, Q24H  cinacalcet (SENSIPAR) tablet, 30 mg, Oral, QPM  cloNIDine (CATAPRES) tablet, 0.2 mg, Oral, 2x/day  clopidogrel (PLAVIX) 75 mg tablet, 75 mg, Oral, Daily  cyclobenzaprine (FLEXERIL) tablet, 5 mg, Oral, 2x/day PRN  dextrose 50% (0.5 g/mL) injection - syringe, 25 g, Intravenous, Q15 Min PRN   Or  dextrose 50% (0.5 g/mL) injection - syringe, 12.5 g, Intravenous, Q15 Min PRN   Or  glucagon (GLUCAGEN DIAGNOSTIC KIT) injection 1 mg, 1 mg, Subcutaneous, Q15 Min PRN   Or  glucagon (GLUCAGEN DIAGNOSTIC KIT) injection 1 mg, 1 mg, IntraMUSCULAR, Q15 Min PRN  doxazosin (CARDURA) tablet, 4 mg, Oral, QPM  epoetin alfa-epbx (RETACRIT) 10,000 units/mL injection, 10,000 Units, Intravenous, Give in Dialysis  famotidine (  PEPCID) tablet, 40 mg, Oral, Daily  furosemide (LASIX) tablet, 160 mg, Oral, Daily  guaiFENesin 176m per 572moral liquid - for cough (expectorant), 200 mg, Oral, Q4H PRN  hydrALAZINE (APRESOLINE) injection 10 mg, 10 mg, Intravenous, Q6H PRN  hydrALAZINE (APRESOLINE) tablet, 100 mg, Oral, Q8HRS  ipratropium-albuterol 0.5 mg-3 mg(2.5 mg base)/3 mL Solution for Nebulization, 3 mL, Nebulization, 4x/day PRN  ipratropium-albuterol 0.5 mg-3 mg(2.5 mg base)/3 mL Solution for Nebulization, 3 mL, Nebulization, Q4H PRN  isosorbide mononitrate (IMDUR) 24 hr extended release tablet, 120 mg, Oral, QAM  labetalol (NORMODYNE) tablet, 400 mg, Oral, 3x/day  montelukast (SINGULAIR) 10 mg tablet, 10 mg, Oral, QPM  NS flush syringe, 3 mL, Intracatheter,  Q8HRS  NS flush syringe, 3 mL, Intracatheter, Q1H PRN  pantoprazole (PROTONIX) delayed release tablet, 40 mg, Oral, Daily  SSIP insulin R human (HumuLIN R) 100 units/mL injection, 0-12 Units, Subcutaneous, 4x/day PRN          Labs:  Recent Results (from the past 48 hour(s))   CBC WITH DIFF    Collection Time: 12/31/21 10:38 PM   Result Value    WBC 12.1 (H)    HGB 9.5 (L)    HCT 29.5 (L)    PLATELETS 350      No results found for this or any previous visit (from the past 48 hour(s)).   Recent Results (from the past 48 hour(s))   COMPREHENSIVE METABOLIC PANEL, NON-FASTING    Collection Time: 01/01/22  6:29 AM   Result Value    ALKALINE PHOSPHATASE 89    ALT (SGPT) 8    AST (SGOT) 14   COMPREHENSIVE METABOLIC PANEL, NON-FASTING    Collection Time: 12/31/21 10:38 PM   Result Value    ALKALINE PHOSPHATASE 96    ALT (SGPT) 9    AST (SGOT) 17      Results for orders placed or performed during the hospital encounter of 12/31/21 (from the past 48 hour(s))   TROPONIN IN THREE HOURS    Collection Time: 01/01/22  2:28 AM   Result Value    TROPONIN I 79 (H)      No results found for this or any previous visit (from the past 48 hour(s)).   No results found for this or any previous visit (from the past 1344 hour(s)).   No results found for this or any previous visit (from the past 48 hour(s)).     Microbiology:  No results found for any visits on 12/31/21 (from the past 96 hour(s)).    Imaging:   ECG 12 LEAD  Sinus rhythm with 1st degree AV block  Minimal voltage criteria for LVH, may be normal variant ( Cornell product )  Cannot rule out Inferior infarct , age undetermined  Abnormal ECG  When compared with ECG of 16-Dec-2021 07:48,  Minimal criteria for Inferior infarct are now present  ST now depressed in Inferior leads  Confirmed by FeYisroel [id number] 01/01/2022 7:07:46 AM        Assessment/ Plan:   Active Hospital Problems   (*Primary Problem)    Diagnosis    *Pneumonia    Hypertension    Anemia in chronic kidney disease     Coronary artery disease involving native coronary artery    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  We will follow up cultures to deescalate antibiotics accordingly  We will follow up serial chest x-rays  Consult pulmonology  for further recommendations    -Hypertension, uncontrolled  Continue with clonidine, doxazosin, Norvasc, labetalol with hydralazine to adjust medications as blood pressure responds    -End-stage renal disease on hemodialysis  Patient getting hemodialysis Monday Wednesday Friday.  Nephrology follow-up and evaluation appreciated    -Coronary artery disease  Continue with Lipitor, Plavix Imdur.  She currently denies chest    For other chronic conditions, will resume home medications upon medication reconciliation.    Disposition Planning:  Home in 2-3 days depending on clinical course    Norman Herrlich, MD  01/01/2022  [facility]PITAL[facility]

## 2022-01-01 NOTE — Procedures (Addendum)
Central Line    Date/Time: 01/01/2022 12:05 AM    Performed by: Winfred Burn, MD  Authorized by: Winfred Burn, MD    Consent:     Consent obtained:  Verbal and emergent situation    Consent given by:  Patient    Risks, benefits, and alternatives were discussed: yes      Risks discussed:  Arterial puncture, bleeding and infection    Alternatives discussed:  No treatment  Universal protocol:     Procedure explained and questions answered to patient or proxy's satisfaction: yes      Relevant documents present and verified: yes      Test results available: yes      Imaging studies available: yes      Required blood products, implants, devices, and special equipment available: yes      Site/side marked: yes      Immediately prior to procedure, a time out was called: yes      Patient identity confirmed:  Verbally with patient and arm band  Pre-procedure details:     Indication(s): central venous access and insufficient peripheral access      Hand hygiene: Hand hygiene performed prior to insertion      Sterile barrier technique: All elements of maximal sterile technique followed      Skin preparation:  Chlorhexidine    Skin preparation agent: Skin preparation agent completely dried prior to procedure    Sedation:     Sedation type:  None  Anesthesia:     Anesthesia method:  None  Procedure details:     Location:  R femoral    Patient position:  Supine    Procedural supplies:  Triple lumen    Catheter size:  8 Fr    Landmarks identified: yes      Ultrasound guidance: yes      Ultrasound guidance timing: real time      Sterile ultrasound techniques: Sterile gel and sterile probe covers were used      Number of attempts:  1    Successful placement: yes    Post-procedure details:     Post-procedure:  Dressing applied and line sutured    Assessment:  Blood return through all ports and free fluid flow    Procedure completion:  Tolerated well, no immediate complications      Winfred Burn, MD   11/8/202300:05

## 2022-01-02 ENCOUNTER — Inpatient Hospital Stay (HOSPITAL_COMMUNITY): Payer: Medicare Other

## 2022-01-02 DIAGNOSIS — N186 End stage renal disease: Secondary | ICD-10-CM

## 2022-01-02 DIAGNOSIS — I120 Hypertensive chronic kidney disease with stage 5 chronic kidney disease or end stage renal disease: Secondary | ICD-10-CM

## 2022-01-02 DIAGNOSIS — Z7902 Long term (current) use of antithrombotics/antiplatelets: Secondary | ICD-10-CM

## 2022-01-02 DIAGNOSIS — Z79899 Other long term (current) drug therapy: Secondary | ICD-10-CM

## 2022-01-02 DIAGNOSIS — I2510 Atherosclerotic heart disease of native coronary artery without angina pectoris: Secondary | ICD-10-CM

## 2022-01-02 DIAGNOSIS — R918 Other nonspecific abnormal finding of lung field: Secondary | ICD-10-CM

## 2022-01-02 DIAGNOSIS — E8770 Fluid overload, unspecified: Secondary | ICD-10-CM

## 2022-01-02 DIAGNOSIS — J189 Pneumonia, unspecified organism: Principal | ICD-10-CM

## 2022-01-02 LAB — PARATHYROID HORMONE (PTH): PTH: 326.2 pg/mL — ABNORMAL HIGH (ref 12.0–88.0)

## 2022-01-02 LAB — IRON TRANSFERRIN AND TIBC
IRON (TRANSFERRIN) SATURATION: 27 % (ref 15–50)
IRON: 48 ug/dL — ABNORMAL LOW (ref 50–212)
TOTAL IRON BINDING CAPACITY: 181 ug/dL — ABNORMAL LOW (ref 250–450)
TRANSFERRIN: 129 mg/dL — ABNORMAL LOW (ref 203–362)
UIBC: 133 ug/dL (ref 130–375)

## 2022-01-02 LAB — CBC WITH DIFF
BASOPHIL #: 0.1 10*3/uL (ref 0.00–0.10)
BASOPHIL %: 1 % (ref 0–1)
EOSINOPHIL #: 0.1 10*3/uL (ref 0.00–0.50)
EOSINOPHIL %: 1 %
HCT: 24.6 % — ABNORMAL LOW (ref 31.2–41.9)
HGB: 8.2 g/dL — ABNORMAL LOW (ref 10.9–14.3)
LYMPHOCYTE #: 1 10*3/uL (ref 1.00–3.00)
LYMPHOCYTE %: 12 % — ABNORMAL LOW (ref 16–44)
MCH: 29.4 pg (ref 24.7–32.8)
MCHC: 33.5 g/dL (ref 32.3–35.6)
MCV: 87.7 fL (ref 75.5–95.3)
MONOCYTE #: 0.6 10*3/uL (ref 0.30–1.00)
MONOCYTE %: 7 % (ref 5–13)
MPV: 6.9 fL — ABNORMAL LOW (ref 7.9–10.8)
NEUTROPHIL #: 6.8 10*3/uL (ref 1.85–7.80)
NEUTROPHIL %: 79 % — ABNORMAL HIGH (ref 43–77)
PLATELETS: 302 10*3/uL (ref 140–440)
RBC: 2.8 10*6/uL — ABNORMAL LOW (ref 3.63–4.92)
RDW: 20.9 % — ABNORMAL HIGH (ref 12.3–17.7)
WBC: 8.6 10*3/uL (ref 3.8–11.8)

## 2022-01-02 LAB — MAGNESIUM: MAGNESIUM: 2.2 mg/dL (ref 1.9–2.7)

## 2022-01-02 LAB — BASIC METABOLIC PANEL
ANION GAP: 17 mmol/L — ABNORMAL HIGH (ref 4–13)
BUN/CREA RATIO: 6 (ref 6–22)
BUN: 64 mg/dL — ABNORMAL HIGH (ref 7–25)
CALCIUM: 8.6 mg/dL (ref 8.6–10.3)
CHLORIDE: 96 mmol/L — ABNORMAL LOW (ref 98–107)
CO2 TOTAL: 24 mmol/L (ref 21–31)
CREATININE: 11.52 mg/dL — ABNORMAL HIGH (ref 0.60–1.30)
ESTIMATED GFR: 4 mL/min/{1.73_m2} — ABNORMAL LOW (ref >59)
GLUCOSE: 99 mg/dL (ref 74–109)
OSMOLALITY, CALCULATED: 292 mOsm/kg — ABNORMAL HIGH (ref 270–290)
POTASSIUM: 4.2 mmol/L (ref 3.5–5.1)
SODIUM: 137 mmol/L (ref 136–145)

## 2022-01-02 LAB — POC BLOOD GLUCOSE (RESULTS): GLUCOSE, POC: 92 mg/dl (ref 50–500)

## 2022-01-02 LAB — VITAMIN B12: VITAMIN B 12: 307 pg/mL (ref 180–914)

## 2022-01-02 LAB — FOLATE: FOLATE: 8.8 ng/mL (ref 5.9–24.4)

## 2022-01-02 NOTE — Respiratory Therapy (Signed)
Smoking/Tobacco Cessation education completed.

## 2022-01-02 NOTE — Nurses Notes (Signed)
1935 pt hob elevated,call bell in reach, pt refused to have her BS checked, pt stated she is not a diabetic, adm sandwich tray, pt stated she did not eat much today, no sob noted, will monitor, pt refused to have her BS taken.  0000 pt c/o nausea, no vomiting noted, adm compazine, prn compazine effective.  0330 pt resting, no distress noted.  0500 this rn drew am labs via rt femoral tlc, pt tolerated well, will monitor.

## 2022-01-02 NOTE — Nurses Notes (Signed)
Patient refusing accu checks. Attending FNP notified. Patient's 'ast accucheck was 41.

## 2022-01-02 NOTE — Progress Notes (Addendum)
[facility]               IP PROGRESS NOTE      [name], [name]  Date of Admission:  12/31/2021  Date of Birth:  Oct 21, 1975  Date of Service:  01/02/2022    Hospital Day:  LOS: 1 day       Subjective:   Patient sleeping in dialysis. Woke easily. Discussed plan for dialysis tomorrow, then probable d/c after. Patient wanting to go home today, but admits she doesn't want to go to dialysis tomorrow. Discussed need to get her back on track from missing previous dialysis sessions. Patient verbalized understanding, but stated she was tired of it and that close together makes her feel bad.     Vital Signs:  Temp (24hrs) Max:36.8 C (98.2 F)      Temperature: 36.6 C (97.9 F)  BP (Non-Invasive): (!) 145/86  MAP (Non-Invasive): 102 mmHG  Heart Rate: 69  Respiratory Rate: 17  SpO2: 96 %    Current Medications:  acetaminophen (TYLENOL) tablet, 650 mg, Oral, Q4H PRN  acetaminophen (TYLENOL) tablet, 650 mg, Oral, Q4H PRN  albuterol 90 mcg per inhalation oral inhaler - "Respiratory to administer", 2 Puff, Inhalation, Q6H PRN  alcohol 62 % (NOZIN NASAL SANITIZER) nasal solution, 1 Each, Each Nostril, 2x/day  amLODIPine (NORVASC) tablet, 10 mg, Oral, Daily  atorvastatin (LIPITOR) tablet, 80 mg, Oral, QPM  azithromycin (ZITHROMAX) 500 mg in NS 250 mL IVPB, 500 mg, Intravenous, Q24H  budesonide-formoterol (SYMBICORT) 160 mcg-4.5 mcg per inhalation oral inhaler - "Respiratory to administer", 2 Puff, Inhalation, 2x/day  calcitriol (ROCALTROL) capsule, 1 mcg, Oral, M, W, and F  calcium acetate (PHOSLO) capsule, 1,334 mg, Oral, 3x/day-Meals  cefTRIAXone (ROCEPHIN) 1 g in NS 50 mL IVPB minibag, 1 g, Intravenous, Q24H  cinacalcet (SENSIPAR) tablet, 30 mg, Oral, QPM  cloNIDine (CATAPRES) tablet, 0.2 mg, Oral, 2x/day  clopidogrel (PLAVIX) 75 mg tablet, 75 mg, Oral, Daily  cyclobenzaprine (FLEXERIL) tablet, 5 mg, Oral, 2x/day PRN  dextrose 50% (0.5 g/mL) injection - syringe, 25 g, Intravenous, Q15 Min PRN   Or  dextrose 50%  (0.5 g/mL) injection - syringe, 12.5 g, Intravenous, Q15 Min PRN   Or  glucagon (GLUCAGEN DIAGNOSTIC KIT) injection 1 mg, 1 mg, Subcutaneous, Q15 Min PRN   Or  glucagon (GLUCAGEN DIAGNOSTIC KIT) injection 1 mg, 1 mg, IntraMUSCULAR, Q15 Min PRN  doxazosin (CARDURA) tablet, 4 mg, Oral, QPM  epoetin alfa-epbx (RETACRIT) 10,000 units/mL injection, 10,000 Units, Intravenous, Give in Dialysis  famotidine (PEPCID) tablet, 40 mg, Oral, Daily  furosemide (LASIX) tablet, 160 mg, Oral, Daily  guaiFENesin 18m per 513moral liquid - for cough (expectorant), 200 mg, Oral, Q4H PRN  hydrALAZINE (APRESOLINE) injection 10 mg, 10 mg, Intravenous, Q6H PRN  hydrALAZINE (APRESOLINE) tablet, 100 mg, Oral, Q8HRS  ipratropium-albuterol 0.5 mg-3 mg(2.5 mg base)/3 mL Solution for Nebulization, 3 mL, Nebulization, 4x/day PRN  ipratropium-albuterol 0.5 mg-3 mg(2.5 mg base)/3 mL Solution for Nebulization, 3 mL, Nebulization, Q4H PRN  isosorbide mononitrate (IMDUR) 24 hr extended release tablet, 120 mg, Oral, QAM  labetalol (NORMODYNE) tablet, 400 mg, Oral, 3x/day  montelukast (SINGULAIR) 10 mg tablet, 10 mg, Oral, QPM  NS flush syringe, 3 mL, Intracatheter, Q8HRS  NS flush syringe, 3 mL, Intracatheter, Q1H PRN  pantoprazole (PROTONIX) delayed release tablet, 40 mg, Oral, Daily  prochlorperazine (COMPAZINE) 5 mg/mL injection, 10 mg, Intravenous, Q6H PRN  SSIP insulin R human (HumuLIN R) 100 units/mL injection, 0-12 Units, Subcutaneous, 4x/day PRN  traMADol (ULTRAM) tablet,  50 mg, Oral, Q8H PRN    Current Orders:  Active Orders   Lab    CBC     Frequency: 0530 - AM DRAW     Number of Occurrences: 1 Occurrences    COMPREHENSIVE METABOLIC PANEL, NON-FASTING     Frequency: 0530 - AM DRAW     Number of Occurrences: 1 Occurrences    MAGNESIUM     Frequency: 0530 - AM DRAW     Number of Occurrences: 1 Occurrences   Diet    DIET CARDIAC (2G NA, LOWFAT, LOW CHOL) Do you want to initiate MNT Protocol? Yes; Additional modifications/limitations: RENAL      Frequency: All Meals     Number of Occurrences: 1 Occurrences   Nursing    ACTIVITY Activity: AS TOLERATED; Instructions: WITH ASSIST; Weight bearing limits: NO WEIGHT-BEARING LIMITATIONS     Frequency: UNTIL DISCONTINUED     Number of Occurrences: Until Specified    APPLY SEQUENTIAL COMPRESSION DEVICE     Frequency: ONE TIME     Number of Occurrences: 1 Occurrences    INTAKE AND OUTPUT QSHIFT     Frequency: QSHIFT     Number of Occurrences: Until Specified    MAINTAIN SEQUENTIAL COMPRESSION DEVICE     Frequency: CONTINUOUS X 72 HRS     Number of Occurrences: 72 Hours    MISCELLANEOUS MD/DO TO NURSE     Frequency: ONE TIME     Number of Occurrences: 1 Occurrences     Order Comments: Notify physician when patient arrives to [facility], thank you      Notify MD Vital Signs     Frequency: PRN     Number of Occurrences: Until Specified    PT IS INTERMEDIATE RISK FOR VENOUS THROMBOEMBOLISM     Frequency: CONTINUOUS     Number of Occurrences: Until Specified    PULSE OXIMETRY Q4H     Frequency: Q4H     Number of Occurrences: Until Specified    TELEMETRY MONITORING - Continuous     Frequency: CONTINUOUS     Number of Occurrences: Until Specified    VITAL SIGNS  Q4H     Frequency: Q4H     Number of Occurrences: Until Specified    WEIGH PATIENT PRE AND POST DIALYSIS TREATMENT     Frequency: EVERY OTHER DAY     Number of Occurrences: Until Specified   Consult    IP CONSULT TO NEPHROLOGY Requested Provider; EL-KHATIB, ABBAS     Frequency: ONE TIME     Number of Occurrences: 1 Occurrences    IP CONSULT TO PALLIATIVE CARE     Frequency: ONE TIME     Number of Occurrences: 1 Occurrences    IP CONSULT TO PULMONOLOGY Requested Provider; Posey Pronto, VISHNU     Frequency: ONE TIME     Number of Occurrences: 1 Occurrences   Point of Care Testing    PERFORM POC WHOLE BLOOD GLUCOSE     Frequency: TID AC & HS     Number of Occurrences: Until Specified   IV    INSERT & MAINTAIN PERIPHERAL IV ACCESS     Frequency: UNTIL DISCONTINUED     Number of  Occurrences: Until Specified    PERIPHERAL IV DRESSING CHANGE     Frequency: PRN     Number of Occurrences: Until Specified   Dialysis    HEMODIALYSIS     Frequency: ONE TIME     Number of Occurrences: 1 Occurrences   Medications  acetaminophen (TYLENOL) tablet     Frequency: Q4H PRN     Dose: 650 mg     Route: Oral    acetaminophen (TYLENOL) tablet     Frequency: Q4H PRN     Dose: 650 mg     Route: Oral    albuterol 90 mcg per inhalation oral inhaler - "Respiratory to administer"     Frequency: Q6H PRN     Dose: 2 Puff     Route: Inhalation    alcohol 62 % (NOZIN NASAL SANITIZER) nasal solution     Frequency: 2x/day     Dose: 1 Each     Route: Each Nostril    amLODIPine (NORVASC) tablet     Frequency: Daily     Dose: 10 mg     Route: Oral    atorvastatin (LIPITOR) tablet     Frequency: QPM     Dose: 80 mg     Route: Oral    azithromycin (ZITHROMAX) 500 mg in NS 250 mL IVPB     Frequency: Q24H     Dose: 500 mg     Route: Intravenous    budesonide-formoterol (SYMBICORT) 160 mcg-4.5 mcg per inhalation oral inhaler - "Respiratory to administer"     Frequency: 2x/day     Dose: 2 Puff     Route: Inhalation    calcitriol (ROCALTROL) capsule     Frequency: M, W, and F     Dose: 1 mcg     Route: Oral    calcium acetate (PHOSLO) capsule     Frequency: 3x/day-Meals     Dose: 1,334 mg     Route: Oral    cefTRIAXone (ROCEPHIN) 1 g in NS 50 mL IVPB minibag     Frequency: Q24H     Dose: 1 g     Route: Intravenous    cinacalcet (SENSIPAR) tablet     Frequency: QPM     Dose: 30 mg     Route: Oral    cloNIDine (CATAPRES) tablet     Frequency: 2x/day     Dose: 0.2 mg     Route: Oral    clopidogrel (PLAVIX) 75 mg tablet     Frequency: Daily     Dose: 75 mg     Route: Oral    cyclobenzaprine (FLEXERIL) tablet     Frequency: 2x/day PRN     Dose: 5 mg     Route: Oral    dextrose 50% (0.5 g/mL) injection - syringe     Linked Order: Or     Frequency: Q15 Min PRN     Dose: 25 g     Route: Intravenous    dextrose 50% (0.5 g/mL) injection -  syringe     Linked Order: Or     Frequency: Q15 Min PRN     Dose: 12.5 g     Route: Intravenous    doxazosin (CARDURA) tablet     Frequency: QPM     Dose: 4 mg     Route: Oral    epoetin alfa-epbx (RETACRIT) 10,000 units/mL injection     Frequency: Give in Dialysis     Dose: 10,000 Units     Route: Intravenous    famotidine (PEPCID) tablet     Frequency: Daily     Dose: 40 mg     Route: Oral    furosemide (LASIX) tablet     Frequency: Daily     Dose: 160 mg     Route:  Oral    glucagon (GLUCAGEN DIAGNOSTIC KIT) injection 1 mg     Linked Order: Or     Frequency: Q15 Min PRN     Dose: 1 mg     Route: Subcutaneous    glucagon (GLUCAGEN DIAGNOSTIC KIT) injection 1 mg     Linked Order: Or     Frequency: Q15 Min PRN     Dose: 1 mg     Route: IntraMUSCULAR    guaiFENesin 145m per 558moral liquid - for cough (expectorant)     Frequency: Q4H PRN     Dose: 200 mg     Route: Oral    hydrALAZINE (APRESOLINE) injection 10 mg     Frequency: Q6H PRN     Dose: 10 mg     Route: Intravenous    hydrALAZINE (APRESOLINE) tablet     Frequency: Q8HRS     Dose: 100 mg     Route: Oral    ipratropium-albuterol 0.5 mg-3 mg(2.5 mg base)/3 mL Solution for Nebulization     Frequency: 4x/day PRN     Dose: 3 mL     Route: Nebulization    ipratropium-albuterol 0.5 mg-3 mg(2.5 mg base)/3 mL Solution for Nebulization     Frequency: Q4H PRN     Dose: 3 mL     Route: Nebulization    isosorbide mononitrate (IMDUR) 24 hr extended release tablet     Frequency: QAM     Dose: 120 mg     Route: Oral    labetalol (NORMODYNE) tablet     Frequency: 3x/day     Dose: 400 mg     Route: Oral    montelukast (SINGULAIR) 10 mg tablet     Frequency: QPM     Dose: 10 mg     Route: Oral    NS flush syringe     Frequency: Q8HRS     Dose: 3 mL     Route: Intracatheter    NS flush syringe     Frequency: Q1H PRN     Dose: 3 mL     Route: Intracatheter    pantoprazole (PROTONIX) delayed release tablet     Frequency: Daily     Dose: 40 mg     Route: Oral    prochlorperazine  (COMPAZINE) 5 mg/mL injection     Frequency: Q6H PRN     Dose: 10 mg     Route: Intravenous    SSIP insulin R human (HumuLIN R) 100 units/mL injection     Frequency: 4x/day PRN     Dose: 0-12 Units     Route: Subcutaneous    traMADol (ULTRAM) tablet     Frequency: Q8H PRN     Dose: 50 mg     Route: Oral      Review of Systems:  Focused review of system was completed. Refer to the HPI for ROS details.     Today's Physical Exam:  Physical Exam  Constitutional:       General: She is not in acute distress.  Cardiovascular:      Rate and Rhythm: Normal rate and regular rhythm.   Pulmonary:      Effort: No respiratory distress.      Breath sounds: Normal breath sounds. No wheezing.   Abdominal:      General: Bowel sounds are normal. There is no distension.      Palpations: Abdomen is soft.      Tenderness: There is no abdominal tenderness.  Musculoskeletal:         General: No swelling.   Skin:     General: Skin is warm and dry.   Neurological:      General: No focal deficit present.      Mental Status: She is alert.     I/O:  I/O last 24 hours:    Intake/Output Summary (Last 24 hours) at 01/02/2022 1219  Last data filed at 01/02/2022 0452  Gross per 24 hour   Intake 645 ml   Output 100 ml   Net 545 ml     I/O current shift:  No intake/output data recorded.    Labs:  Reviewed: I have reviewed all lab results.  Lab Results Today:    Results for orders placed or performed during the hospital encounter of 12/31/21 (from the past 24 hour(s))   ECG 12 LEAD   Result Value Ref Range    Ventricular rate 83 BPM    Atrial Rate 83 BPM    PR Interval 228 ms    QRS Duration 110 ms    QT Interval 452 ms    QTC Calculation 531 ms    Calculated P Axis 81 degrees    Calculated R Axis 41 degrees    Calculated T Axis 88 degrees   BASIC METABOLIC PANEL, NON-FASTING   Result Value Ref Range    SODIUM 137 136 - 145 mmol/L    POTASSIUM 4.2 3.5 - 5.1 mmol/L    CHLORIDE 96 (L) 98 - 107 mmol/L    CO2 TOTAL 24 21 - 31 mmol/L    ANION GAP 17 (H) 4 -  13 mmol/L    CALCIUM 8.6 8.6 - 10.3 mg/dL    GLUCOSE 99 74 - 109 mg/dL    BUN 64 (H) 7 - 25 mg/dL    CREATININE 11.52 (H) 0.60 - 1.30 mg/dL    BUN/CREA RATIO 6 6 - 22    ESTIMATED GFR 4 (L) >59 mL/min/1.32m2    OSMOLALITY, CALCULATED 292 (H) 270 - 290 mOsm/kg   FOLATE   Result Value Ref Range    FOLATE 8.8 5.9 - 24.4 ng/mL   IRON TRANSFERRIN AND TIBC   Result Value Ref Range    TOTAL IRON BINDING CAPACITY 181 (L) 250 - 450 ug/dL    IRON (TRANSFERRIN) SATURATION 27 15 - 50 %    IRON 48 (L) 50 - 212 ug/dL    TRANSFERRIN 129 (L) 203 - 362 mg/dL    UIBC 133 130 - 375 ug/dL   MAGNESIUM   Result Value Ref Range    MAGNESIUM 2.2 1.9 - 2.7 mg/dL   PARATHYROID HORMONE (PTH)   Result Value Ref Range    PTH 326.2 (H) 12.0 - 88.0 pg/mL   VITAMIN B12   Result Value Ref Range    VITAMIN B 12 307 180 - 914 pg/mL   CBC WITH DIFF   Result Value Ref Range    WBC 8.6 3.8 - 11.8 x10^3/uL    RBC 2.80 (L) 3.63 - 4.92 x10^6/uL    HGB 8.2 (L) 10.9 - 14.3 g/dL    HCT 24.6 (L) 31.2 - 41.9 %    MCV 87.7 75.5 - 95.3 fL    MCH 29.4 24.7 - 32.8 pg    MCHC 33.5 32.3 - 35.6 g/dL    RDW 20.9 (H) 12.3 - 17.7 %    PLATELETS 302 140 - 440 x10^3/uL    MPV 6.9 (L) 7.9 - 10.8 fL  NEUTROPHIL % 79 (H) 43 - 77 %    LYMPHOCYTE % 12 (L) 16 - 44 %    MONOCYTE % 7 5 - 13 %    EOSINOPHIL % 1 %    BASOPHIL % 1 0 - 1 %    NEUTROPHIL # 6.80 1.85 - 7.80 x10^3/uL    LYMPHOCYTE # 1.00 1.00 - 3.00 x10^3/uL    MONOCYTE # 0.60 0.30 - 1.00 x10^3/uL    EOSINOPHIL # 0.10 0.00 - 0.50 x10^3/uL    BASOPHIL # 0.10 0.00 - 0.10 x10^3/uL   POC BLOOD GLUCOSE (RESULTS)   Result Value Ref Range    GLUCOSE, POC 92 50 - 500 mg/dl     Micro Results: No results found for any visits on 12/31/21 (from the past 24 hour(s)).  Images:   XR CHEST AP    Result Date: 12/31/2021  Impression Mild vascular congestion with ill-defined infiltrates within both perihilar regions as well as the right lung base. Stable mild enlargement of the cardiac silhouette. Radiologist location ID: [id number]      XR AP MOBILE CHEST   Final Result   Bilateral infiltrates are again identified and appear somewhat improved since the last examination.         Radiologist location ID: [id number]         XR CHEST AP   Final Result   Mild vascular congestion with ill-defined infiltrates within both perihilar regions as well as the right lung base.      Stable mild enlargement of the cardiac silhouette.         Radiologist location ID: [id number]           Problem List:  [facility]   (*Primary Problem)    Diagnosis    *Pneumonia    Hypertension    Hyperkalemia    Bilateral pneumonia    Anemia in chronic kidney disease    Coronary artery disease involving native coronary artery    End-stage renal disease on hemodialysis (CMS HCC)     Assessment/ Plan:   Community-acquired pneumonia  Continue Rocephin and azithromycin.  We will follow up cultures to deescalate antibiotics accordingly  Will follow up serial chest x-rays. Today's chest x-ray shows bilateral infiltrates somewhat improved since last exam.   Dr. Sherrye Payor consulted for pulmonology recommendations. Recommends to continue duonebs, incentive spirometry, early ambulation, and aggressive dialysis.        2. Hypertension, uncontrolled  Continue with clonidine, doxazosin, Norvasc, labetalol with hydralazine to adjust medications as blood pressure responds     3. End-stage renal disease on hemodialysis  Patient getting hemodialysis Monday Wednesday Friday. Extra session today per nephrology.   Nephrology follow-up and evaluation appreciated     4. Coronary artery disease  Continue with Lipitor, Plavix Imdur.    5. Other:  -Elevated troponin due to chronic kidney disease  -Acute on chronic hypoxic respiratory  -consider d/c home tomorrow if remains stable after dialysis.   -further treatment adjustments will be made based off clinical course. See orders.   -Hospitalist personally evaluated and examined the patient in conjunction with the MLP and agree with the  assessments, treatment plan and disposition of the patient as recorded by the [facility].     DVT/PE Prophylaxis: SCDs/ Venodynes/Impulse boots    Joselyn Glassman, FNP-BC      Plan of care discussed as documented above.    [place], [place] HOSPITALIST

## 2022-01-02 NOTE — Care Plan (Signed)
Pt will not have any cp by d/c.  Problem: Pain Acute  Goal: Optimal Pain Control and Function  Outcome: Ongoing (see interventions/notes)  Intervention: Optimize Psychosocial Wellbeing  Recent Flowsheet Documentation  Taken 01/01/2022 1935 by Seward Speck, RN  Diversional Activities: television  Supportive Measures: active listening utilized

## 2022-01-02 NOTE — Consults (Signed)
[facility]   Nephrology Consult      [place], [place], 46 y.o. female  Encounter Start Date:  12/31/2021  Inpatient Admission Date:  01/01/2022  Date of Birth:  11-Dec-1975    Admitting Provider:  Hospitalist Service    Reason for Consult:  End-stage renal disease on hemodialysis with fluid overload    HPI:  [name] is a 46 y.o. female who presented to the ED with increased SOB and fever.  She started having shortness of breath and fever on Monday during dialysis.  Chest x-ray- mild vascular congestion with ill-defined infiltrates within both perihilar regions as well as the right lung base.  WBC 12.1, H/H 9.5/29.5, Na 128, K+ 5.5, Bun 90, Cr 15.75, Trop i 79.  Patient admitted with pneumonia, HTN, ESRD, CAD   Patient known to have end-stage renal disease on hemodialysis 3 times a week missed dialysis came in with shortness of breadth and hyperkalemia potassium 6.9 dialyzed today ultrafiltration 3 L dialyzed on a 1K bath still short of breath will arrange for dialysis tomorrow 2nd session with ultrafiltration aiming for 3 L tomorrow anemia of chronic kidney disease hemoglobin 9.3 will check anemia profile and PTH   01-02-2022  Nephrology progress note on this patient with end-stage renal disease hemodialysis Monday Wednesday Friday came in with fluid overload chest infection pneumonia started on antibiotic dialyzed 2 days in a row tomorrow is her dialysis day will be dialyzed also tomorrow to be back on her scheduled for dialysis anemia chronic kidney disease treatment per protocol    Past Medical History:   Diagnosis Date    Asthma     Chronic diastolic CHF (congestive heart failure) (CMS HCC)     Dependence on supplemental oxygen     Esophageal reflux     ESRD (end stage renal disease) (CMS HCC)     History of anemia due to CKD     MI (myocardial infarction) (CMS HCC)     Mitral valve regurgitation     Pulmonary edema     Sleep apnea     SVC syndrome     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    Tricuspid valve regurgitation     Uncontrolled hypertension          Medications Prior to Admission       Prescriptions    acetaminophen (TYLENOL) 325 mg Oral Tablet    Take 2 Tablets (650 mg total) by mouth Every 4 hours as needed    amLODIPine (NORVASC) 10 mg Oral Tablet    Take 1 Tablet (10 mg total) by mouth Once a day Take one tablet every day    budesonide-formoteroL (SYMBICORT) 160-4.5 mcg/actuation Inhalation oral inhaler    Take 2 Puffs by inhalation Twice daily    calcitrioL (ROCALTROL) 0.5 mcg Oral Capsule    Take 2 Capsules (1 mcg total) by mouth Every Monday, Wednesday and Friday    calcium acetate,phosphat bind, (PHOSLO) 667 mg Oral Capsule    Take 2 Capsules (1,334 mg total) by mouth Three times daily with meals for 30 days    cinacalcet (SENSIPAR) 30 mg Oral Tablet    Take 1 Tablet (30 mg total) by mouth Every evening    cloNIDine (CATAPRES-TTS) 0.3 mg/24 hr Transdermal Patch Weekly    Place 1 Patch (0.3 mg total) on the skin Every 7 days    cloNIDine HCL (CATAPRES) 0.2 mg Oral Tablet    Take 1  Tablet (0.2 mg total) by mouth Twice daily Take one tablet twice a day    clopidogreL (PLAVIX) 75 mg Oral Tablet    Take 1 Tablet (75 mg total) by mouth Once a day    cyclobenzaprine (FLEXERIL) 5 mg Oral Tablet    Take 1 Tablet (5 mg total) by mouth Twice per day as needed for Muscle spasms    doxazosin (CARDURA) 4 mg Oral Tablet    Take 1 Tablet (4 mg total) by mouth Every evening    epoetin alfa-epbx (RETACRIT) 10,000 unit/mL Injection Solution    Infuse 1 mL (10,000 Units total) into a venous catheter Give in Dialysis for 30 days    famotidine (PEPCID) 40 mg Oral Tablet    Take 1 Tablet (40 mg total) by mouth Once a day    furosemide (LASIX) 80 mg Oral Tablet    Take 2 Tablets (160 mg total) by mouth Once a day Take after dialysis on dialysis days; otherwise take in morning daily.    hydrALAZINE (APRESOLINE) 100 mg Oral Tablet    Take 1 Tablet (100 mg  total) by mouth Every 8 hours for 30 days    ipratropium-albuterol 0.5 mg-3 mg(2.5 mg base)/3 mL Solution for Nebulization    Take 3 mL by nebulization Four times a day as needed    isosorbide mononitrate (IMDUR) 120 mg Oral Tablet Sustained Release 24 hr    Take 1 Tablet (120 mg total) by mouth Every morning for 30 days    isosorbide mononitrate (IMDUR) 60 mg Oral Tablet Sustained Release 24 hr    Take 1 Tablet (60 mg total) by mouth Every morning    labetaloL (NORMODYNE) 200 mg Oral Tablet    Take 2 Tablets (400 mg total) by mouth Three times a day    losartan (COZAAR) 100 mg Oral Tablet    Take 1 Tablet (100 mg total) by mouth Once a day    montelukast (SINGULAIR) 10 mg Oral Tablet    Take 1 Tablet (10 mg total) by mouth Every evening    nitroGLYCERIN (NITROSTAT) 0.4 mg Sublingual Tablet, Sublingual    Place 1 Tablet (0.4 mg total) under the tongue Every 5 minutes as needed for Chest pain for up to 3 doses for 3 doses over 15 minutes    omeprazole (PRILOSEC) 40 mg Oral Capsule, Delayed Release(E.C.)    Take 1 Capsule (40 mg total) by mouth Once a day    ondansetron (ZOFRAN ODT) 4 mg Oral Tablet, Rapid Dissolve    Take 1 Tablet (4 mg total) by mouth Every 8 hours as needed for Nausea/Vomiting    pantoprazole (PROTONIX) 40 mg Oral Tablet, Delayed Release (E.C.)    Take 1 Tablet (40 mg total) by mouth Once a day    rosuvastatin (CRESTOR) 40 mg Oral Tablet    Take 1 Tablet (40 mg total) by mouth Every evening Take one tablet every day    traMADoL (ULTRAM) 50 mg Oral Tablet    Take 1 Tablet (50 mg total) by mouth Every 6 hours as needed for Pain          acetaminophen (TYLENOL) tablet, 650 mg, Oral, Q4H PRN  acetaminophen (TYLENOL) tablet, 650 mg, Oral, Q4H PRN  albuterol 90 mcg per inhalation oral inhaler - "Respiratory to administer", 2 Puff, Inhalation, Q6H PRN  alcohol 62 % (NOZIN NASAL SANITIZER) nasal solution, 1 Each, Each Nostril, 2x/day  amLODIPine (NORVASC) tablet, 10 mg, Oral, Daily  atorvastatin (LIPITOR)  tablet, 80  mg, Oral, QPM  azithromycin (ZITHROMAX) 500 mg in NS 250 mL IVPB, 500 mg, Intravenous, Q24H  budesonide-formoterol (SYMBICORT) 160 mcg-4.5 mcg per inhalation oral inhaler - "Respiratory to administer", 2 Puff, Inhalation, 2x/day  calcitriol (ROCALTROL) capsule, 1 mcg, Oral, M, W, and F  calcium acetate (PHOSLO) capsule, 1,334 mg, Oral, 3x/day-Meals  cefTRIAXone (ROCEPHIN) 1 g in NS 50 mL IVPB minibag, 1 g, Intravenous, Q24H  cinacalcet (SENSIPAR) tablet, 30 mg, Oral, QPM  cloNIDine (CATAPRES) tablet, 0.2 mg, Oral, 2x/day  clopidogrel (PLAVIX) 75 mg tablet, 75 mg, Oral, Daily  cyclobenzaprine (FLEXERIL) tablet, 5 mg, Oral, 2x/day PRN  dextrose 50% (0.5 g/mL) injection - syringe, 25 g, Intravenous, Q15 Min PRN   Or  dextrose 50% (0.5 g/mL) injection - syringe, 12.5 g, Intravenous, Q15 Min PRN   Or  glucagon (GLUCAGEN DIAGNOSTIC KIT) injection 1 mg, 1 mg, Subcutaneous, Q15 Min PRN   Or  glucagon (GLUCAGEN DIAGNOSTIC KIT) injection 1 mg, 1 mg, IntraMUSCULAR, Q15 Min PRN  doxazosin (CARDURA) tablet, 4 mg, Oral, QPM  epoetin alfa-epbx (RETACRIT) 10,000 units/mL injection, 10,000 Units, Intravenous, Give in Dialysis  famotidine (PEPCID) tablet, 40 mg, Oral, Daily  furosemide (LASIX) tablet, 160 mg, Oral, Daily  guaiFENesin 143m per 511moral liquid - for cough (expectorant), 200 mg, Oral, Q4H PRN  hydrALAZINE (APRESOLINE) injection 10 mg, 10 mg, Intravenous, Q6H PRN  hydrALAZINE (APRESOLINE) tablet, 100 mg, Oral, Q8HRS  ipratropium-albuterol 0.5 mg-3 mg(2.5 mg base)/3 mL Solution for Nebulization, 3 mL, Nebulization, 4x/day PRN  ipratropium-albuterol 0.5 mg-3 mg(2.5 mg base)/3 mL Solution for Nebulization, 3 mL, Nebulization, Q4H PRN  isosorbide mononitrate (IMDUR) 24 hr extended release tablet, 120 mg, Oral, QAM  labetalol (NORMODYNE) tablet, 400 mg, Oral, 3x/day  montelukast (SINGULAIR) 10 mg tablet, 10 mg, Oral, QPM  NS flush syringe, 3 mL, Intracatheter, Q8HRS  NS flush syringe, 3 mL, Intracatheter, Q1H  PRN  pantoprazole (PROTONIX) delayed release tablet, 40 mg, Oral, Daily  prochlorperazine (COMPAZINE) 5 mg/mL injection, 10 mg, Intravenous, Q6H PRN  SSIP insulin R human (HumuLIN R) 100 units/mL injection, 0-12 Units, Subcutaneous, 4x/day PRN  traMADol (ULTRAM) tablet, 50 mg, Oral, Q8H PRN      Allergies   Allergen Reactions    Lisinopril Swelling     Tongue and throat swelling    Cardene [Nicardipine]  Other Adverse Reaction (Add comment)     Chest pain     Oxycodone Itching       ROS:   Constitutional: negative for fevers, chills, sweats, and fatigue  Eyes: negative for visual disturbance, irritation, redness, and icterus  Ears, nose, mouth, throat, and face: negative for hearing loss, tinnitus, ear drainage, nasal congestion, epistaxis, and sore throat  Respiratory: negative for cough, sputum, hemoptysis, wheezing, or dyspnea on exertion  Cardiovascular: negative for chest pain, palpitations, , orthopnea, paroxysmal nocturnal dyspnea, and lower extremity edema  Gastrointestinal: negative for  nausea, vomiting, melena, diarrhea, constipation, and abdominal pain  Genitourinary:negative for frequency, dysuria, nocturia, urinary incontinence, hesitancy, and hematuria  Integument/breast: negative for rash, skin lesion(s), and pruritus  Hematologic/lymphatic: negative for easy bruising, bleeding, and petechiae  Musculoskeletal:negative for myalgias, arthralgias, neck pain, back pain, and muscle weakness  Neurological: negative for headaches, dizziness, seizures, speech problems, tremor, and weakness  Behavioral/Psych: negative for anxiety, behavior problems, mood swings, and sleep disturbance  Endocrine: negative for temperature intolerance  Allergic/Immunologic: negative for urticaria and angioedema      EXAM:  Filed Vitals:    01/02/22 0749 01/02/22 0800 01/02/22 1013 01/02/22 1256  BP: (!) 145/86   135/85   Pulse: 75  69    Resp: 17      Temp: 36.6 C (97.9 F)      SpO2: 98% 96%         Constitutional: Alert and  Oriented time person and place . Not in acute distress  HEENT : Vision and hearing grossly normal   Eyes: Conjunctiva clear., Pupils equal and round, reactive to light and accomodation. , Sclera non-icteric.   ENT: Nose without erythema. , Mouth mucous membranes moist.   Neck: JVD negative no thyromegaly or lymphadenopathy and supple, symmetrical, trachea midline  Respiratory: Clear to auscultation bilaterally. No wheezes, No rales, Good air exchange bilaterally  Cardiovascular: regular rate and rhythm no murmurs no rub  Gastrointestinal: Soft, non-tender, Bowel sounds normal, No hepatosplenomegaly  Genitourinary: no suprapubic tenderness  Lower Extremities : No edema . Pulses + 4 bilateral   Neurologic: Grossly normal. Speech clear.   Psychiatric: Normal mood and affect,   Labs:         CBC Results Differential Results   Recent Labs     12/31/21  2238 01/01/22  0629 01/02/22  0435   WBC 12.1* 12.8* 8.6   HGB 9.5* 9.3* 8.2*   HCT 29.5* 29.0* 24.6*    Recent Labs     12/31/21  2238 01/02/22  0435   PMNS 86* 79*   LYMPHOCYTES 6* 12*   MONOCYTES 6 7   EOSINOPHIL 1 1   BASOPHILS 0  0.05 1  0.10   PMNABS 10.34* 6.80   LYMPHSABS 0.77* 1.00   MONOSABS 0.72 0.60   EOSABS 0.17 0.10      BMP Results Other Chemistries Results   Recent Labs     12/31/21  2238 01/01/22  0629 01/02/22  0435   SODIUM 128* 128* 137   POTASSIUM 5.5* 6.9* 4.2   CHLORIDE 89* 92* 96*   CO2 20* 20* 24   BUN 98* 96* 64*   CREATININE 15.75* 15.17* 11.52*   GFR 3* 3* 4*   ANIONGAP 19* 16* 17*    Recent Labs     12/31/21  2238 01/01/22  0629 01/02/22  0435   CALCIUM 9.9 10.1 8.6   ALBUMIN 3.4 4.4  --    MAGNESIUM  --  2.3 2.2      Liver/Pancreas Enzyme Results Blood Gas     Recent Labs     12/31/21  2238 01/01/22  0629   TOTALPROTEIN 8.3* 8.3   ALBUMIN 3.4 4.4   AST 17 14   ALT 9 8   ALKPHOS 96 89    No results found for this encounter   Cardiac Results    Coags Results     TROPONIN I  Lab Results   Component Value Date    UHCEASTTROPI 0.06 ([place])  12/13/2020    TROPONINI 79 (H) 01/01/2022    TROPONINI 79 (H) 01/01/2022    TROPONINI 79 (H) 12/31/2021         No results for input(s): "INR" in the last 72 hours.    Invalid input(s): "PTT", "PT"       Imaging Studies:    XR AP MOBILE CHEST   Final Result   Bilateral infiltrates are again identified and appear somewhat improved since the last examination.         Radiologist location ID: [id number]         XR CHEST AP   Final Result   Mild  vascular congestion with ill-defined infiltrates within both perihilar regions as well as the right lung base.      Stable mild enlargement of the cardiac silhouette.         Radiologist location ID: [id number]         XR AP MOBILE CHEST    (Results Pending)        Assessment/Plan:  Active Hospital Problems    Diagnosis    Primary Problem: Pneumonia    Hypertension    Hyperkalemia    Bilateral pneumonia    Anemia in chronic kidney disease    Coronary artery disease involving native coronary artery    End-stage renal disease on hemodialysis (CMS [facility])      End-stage renal disease hemodialysis Monday Wednesday Friday dialyzed today extra session for fluid overload next dialysis tomorrow  Anemia chronic kidney disease treatment per protocol  Hyperkalemia resolved with dialysis  Pneumonia on antibiotics improving      Richardean Sale, MD

## 2022-01-03 DIAGNOSIS — E875 Hyperkalemia: Secondary | ICD-10-CM

## 2022-01-03 DIAGNOSIS — D631 Anemia in chronic kidney disease: Secondary | ICD-10-CM

## 2022-01-03 LAB — COMPREHENSIVE METABOLIC PANEL, NON-FASTING
ALBUMIN/GLOBULIN RATIO: 1.1 (ref 0.8–1.4)
ALBUMIN: 3.9 g/dL (ref 3.5–5.7)
ALKALINE PHOSPHATASE: 67 U/L (ref 34–104)
ALT (SGPT): <7 U/L — ABNORMAL LOW (ref 7–52)
ANION GAP: 12 mmol/L (ref 4–13)
AST (SGOT): 13 U/L (ref 13–39)
BILIRUBIN TOTAL: 0.6 mg/dL (ref 0.3–1.2)
BUN/CREA RATIO: 5 — ABNORMAL LOW (ref 6–22)
BUN: 39 mg/dL — ABNORMAL HIGH (ref 7–25)
CALCIUM, CORRECTED: 8.7 mg/dL — ABNORMAL LOW (ref 8.9–10.8)
CALCIUM: 8.6 mg/dL (ref 8.6–10.3)
CHLORIDE: 96 mmol/L — ABNORMAL LOW (ref 98–107)
CO2 TOTAL: 28 mmol/L (ref 21–31)
CREATININE: 8.51 mg/dL — ABNORMAL HIGH (ref 0.60–1.30)
ESTIMATED GFR: 5 mL/min/{1.73_m2} — ABNORMAL LOW (ref >59)
GLOBULIN: 3.4 (ref 2.9–5.4)
GLUCOSE: 112 mg/dL — ABNORMAL HIGH (ref 74–109)
OSMOLALITY, CALCULATED: 282 mOsm/kg (ref 270–290)
POTASSIUM: 4.4 mmol/L (ref 3.5–5.1)
PROTEIN TOTAL: 7.3 g/dL (ref 6.4–8.9)
SODIUM: 136 mmol/L (ref 136–145)

## 2022-01-03 LAB — CBC
HCT: 26.4 % — ABNORMAL LOW (ref 31.2–41.9)
HGB: 8.6 g/dL — ABNORMAL LOW (ref 10.9–14.3)
MCH: 28.7 pg (ref 24.7–32.8)
MCHC: 32.5 g/dL (ref 32.3–35.6)
MCV: 88.2 fL (ref 75.5–95.3)
MPV: 6.6 fL — ABNORMAL LOW (ref 7.9–10.8)
PLATELET COMMENT: NORMAL
PLATELETS: 318 10*3/uL (ref 140–440)
RBC: 2.99 10*6/uL — ABNORMAL LOW (ref 3.63–4.92)
RDW: 20.8 % — ABNORMAL HIGH (ref 12.3–17.7)
WBC: 7.4 10*3/uL (ref 3.8–11.8)

## 2022-01-03 LAB — HEPATITIS B SURFACE ANTIGEN: HBV SURFACE ANTIGEN QUALITATIVE: NEGATIVE

## 2022-01-03 LAB — HEPATITIS B CORE IGM, AB: HBV CORE IGM ANTIBODY QUALITATIVE: NEGATIVE

## 2022-01-03 LAB — MAGNESIUM: MAGNESIUM: 2 mg/dL (ref 1.9–2.7)

## 2022-01-03 LAB — HEPATITIS A (HAV) IGM ANTIBODY: HAV IGM: NEGATIVE

## 2022-01-03 MED ORDER — DOXYCYCLINE MONOHYDRATE 100 MG CAPSULE
100.0000 mg | ORAL_CAPSULE | Freq: Two times a day (BID) | ORAL | 0 refills | Status: DC
Start: 2022-01-03 — End: 2022-01-09

## 2022-01-03 MED ORDER — CALCIUM ACETATE(PHOSPHATE BINDERS) 667 MG CAPSULE
1334.0000 mg | ORAL_CAPSULE | Freq: Three times a day (TID) | ORAL | 0 refills | Status: DC
Start: 2022-01-03 — End: 2022-01-30

## 2022-01-03 NOTE — Nurses Notes (Signed)
Patient discharged home with family.  AVS reviewed with patient/care giver.  A written copy of the AVS and discharge instructions was given to the patient/care giver. Scripts handed to patient/care giver. Questions sufficiently answered as needed.  Patient/care giver encouraged to follow up with PCP as indicated.  In the event of an emergency, patient/care giver instructed to call 911 or go to the nearest emergency room. Femoral line removed. Discharge packet explained to patient. Questions sufficiently answered

## 2022-01-03 NOTE — Nurses Notes (Signed)
1930 pt hob elevated,call bell in reach, pt eating snack,pt eager to be d/c,informed pt that staff would assist with hibcense bath for dialysis tomorrow, pt verbalized understanding.  2200 nsg tech assisted pt with hibclense bath and changed bed linen.  0300 pt hob elevated,call bell in reach, no distress noted.  0530 no distress noted, will monitor.

## 2022-01-03 NOTE — Care Plan (Signed)
Pt will not have any cp by d/c.  Problem: Pain Acute  Goal: Optimal Pain Control and Function  Outcome: Ongoing (see interventions/notes)  Intervention: Optimize Psychosocial Wellbeing  Recent Flowsheet Documentation  Taken 01/02/2022 1930 by Seward Speck, RN  Diversional Activities: television  Supportive Measures: active listening utilized

## 2022-01-03 NOTE — Discharge Summary (Signed)
[facility]  DISCHARGE SUMMARY    PATIENT NAME:  [name], [name]  MRN:  [id number]  DOB:  April 06, 1975    ENCOUNTER DATE:  12/31/2021  INPATIENT ADMISSION DATE: 01/01/2022  DISCHARGE DATE:  01/03/2022    ATTENDING PHYSICIAN: Norman Herrlich, MD  SERVICE: PRN HOSPITALIST 2  PRIMARY CARE PHYSICIAN: Cam Hai, FNP       No lay caregiver identified.    PRIMARY DISCHARGE DIAGNOSIS: Pneumonia  Active Hospital Problems    Diagnosis Date Noted    Principal Problem: Pneumonia [J18.9] 01/01/2022    Hypertension [I10] 01/01/2022    Hyperkalemia [E87.5] 01/01/2022    Bilateral pneumonia [J18.9] 01/01/2022    Anemia in chronic kidney disease [N18.9, D63.1] 08/05/2021    Coronary artery disease involving native coronary artery [I25.10] 07/01/2021    End-stage renal disease on hemodialysis (CMS HCC) [N18.6, Z99.2] 03/02/2013      Resolved Hospital Problems   No resolved problems to display.     Active Non-Hospital Problems    Diagnosis Date Noted    Non-STEMI (non-ST elevated myocardial infarction) (CMS HCC) 12/16/2021    Hypertensive emergency 12/16/2021    End-stage renal disease (ESRD) (CMS [place]) 12/16/2021    Tobacco use disorder 11/14/2021    Acute exacerbation of COPD with asthma (CMS [place])  10/16/2021    H/O heart artery stent 10/16/2021    Hypertensive urgency 09/29/2021    Acute respiratory failure with hypoxia (CMS HCC) 08/28/2021    Fluid overload 08/11/2021    Noncompliance 07/01/2021    Tobacco dependence 12/04/2020    Insomnia 12/04/2020           Current Discharge Medication List        START taking these medications.        Details   doxycycline monohydrate 100 mg Capsule  Commonly known as: MONODOX   100 mg, Oral, 2 TIMES DAILY  Qty: 12 Capsule  Refills: 0            CONTINUE these medications - NO CHANGES were made during your visit.        Details   acetaminophen 325 mg Tablet  Commonly known as: TYLENOL   650 mg, Oral, EVERY 4 HOURS PRN  Refills: 0     amLODIPine 10 mg Tablet  Commonly known as:  NORVASC   10 mg, Oral, DAILY, Take one tablet every day  Refills: 0     budesonide-formoteroL 160-4.5 mcg/actuation oral inhaler  Commonly known as: SYMBICORT   2 Puffs, Inhalation, 2 TIMES DAILY  Refills: 0     calcitrioL 0.5 mcg Capsule  Commonly known as: ROCALTROL   1 mcg, Oral, EVERY MO, WE AND FR  Refills: 0     calcium acetate(phosphat bind) 667 mg Capsule  Commonly known as: PHOSLO   1,334 mg, Oral, 3 TIMES DAILY WITH MEALS  Qty: 180 Capsule  Refills: 0     cinacalcet 30 mg Tablet  Commonly known as: SENSIPAR   30 mg, Oral, EVERY EVENING  Refills: 0     cloNIDine 0.3 mg/24 hr Patch Weekly  Commonly known as: CATAPRES-TTS   0.3 mg, Transdermal, EVERY 7 DAYS  Refills: 0     cloNIDine HCL 0.2 mg Tablet  Commonly known as: CATAPRES   0.2 mg, Oral, 2 TIMES DAILY, Take one tablet twice a day  Refills: 0     clopidogreL 75 mg Tablet  Commonly known as: PLAVIX   75 mg, Oral, DAILY  Refills: 0  cyclobenzaprine 5 mg Tablet  Commonly known as: FLEXERIL   5 mg, Oral, 2 TIMES DAILY PRN  Refills: 0     doxazosin 4 mg Tablet  Commonly known as: CARDURA   4 mg, Oral, EVERY EVENING  Refills: 0     famotidine 40 mg Tablet  Commonly known as: PEPCID   40 mg, Oral, DAILY  Refills: 0     furosemide 80 mg Tablet  Commonly known as: LASIX   160 mg, Oral, DAILY, Take after dialysis on dialysis days; otherwise take in morning daily.   Refills: 0     hydrALAZINE 100 mg Tablet  Commonly known as: APRESOLINE   100 mg, Oral, EVERY 8 HOURS (SCHEDULED)  Qty: 90 Tablet  Refills: 0     ipratropium-albuteroL 0.5 mg-3 mg(2.5 mg base)/3 mL nebulizer solution  Commonly known as: DUONEB   3 mL, Nebulization, 4 TIMES DAILY PRN  Refills: 0     * isosorbide mononitrate 60 mg Tablet Sustained Release 24 hr  Commonly known as: IMDUR   60 mg, Oral, EVERY MORNING  Refills: 0     * Isosorbide Mononitrate 120 mg Tablet Sustained Release 24 hr  Commonly known as: IMDUR   120 mg, Oral, EVERY MORNING  Qty: 30 Tablet  Refills: 0     labetaloL 200 mg  Tablet  Commonly known as: NORMODYNE   400 mg, Oral, 3 TIMES DAILY  Refills: 0     losartan 100 mg Tablet  Commonly known as: COZAAR   100 mg, Oral, DAILY  Refills: 0     montelukast 10 mg Tablet  Commonly known as: SINGULAIR   10 mg, Oral, EVERY EVENING  Refills: 0     nitroGLYCERIN 0.4 mg Tablet, Sublingual  Commonly known as: NITROSTAT   0.4 mg, Sublingual, EVERY 5 MIN PRN, for 3 doses over 15 minutes  Qty: 3 Tablet  Refills: 0     omeprazole 40 mg Capsule, Delayed Release(E.C.)  Commonly known as: PRILOSEC   40 mg, Oral, DAILY  Refills: 0     ondansetron 4 mg Tablet, Rapid Dissolve  Commonly known as: ZOFRAN ODT   4 mg, Oral, EVERY 8 HOURS PRN  Qty: 12 Tablet  Refills: 0     pantoprazole 40 mg Tablet, Delayed Release (E.C.)  Commonly known as: PROTONIX   40 mg, Oral, DAILY  Refills: 0     rosuvastatin 40 mg Tablet  Commonly known as: CRESTOR   40 mg, Oral, EVERY EVENING, Take one tablet every day  Refills: 0     traMADoL 50 mg Tablet  Commonly known as: ULTRAM   50 mg, Oral, EVERY 6 HOURS PRN  Qty: 20 Tablet  Refills: 0           * This list has 2 medication(s) that are the same as other medications prescribed for you. Read the directions carefully, and ask your doctor or other care provider to review them with you.                STOP taking these medications.      epoetin alfa-epbx 10,000 unit/mL Solution  Commonly known as: RETACRIT            Discharge med list refreshed?  YES     Allergies   Allergen Reactions    Lisinopril Swelling     Tongue and throat swelling    Cardene [Nicardipine]  Other Adverse Reaction (Add comment)     Chest pain  Oxycodone Itching     HOSPITAL PROCEDURE(S):   Orders Placed This Encounter   Procedures    BEDSIDE  CENTRAL LINE INSERTION/CHANGE/REMOVAL       REASON FOR HOSPITALIZATION AND HOSPITAL COURSE   BRIEF HPI:  This is a 46 y.o., female admitted for pneumonia. Patient presented to the ED via [facility] for increased shortness of breath and fever. She states she first noted her  symptoms the day prior during dialysis. Patient has a history of ESRD and is on hemodialysis Monday, Wednesday, Friday. She is also complaining of chronic chest pain. Patient was recently discharged from this facility on 12/16/2021. ED imaging suggestive of pneumonia. COVID-19 negative.   BRIEF HOSPITAL NARRATIVE:   Patient admitted to hosptialist services for pneumonia treatment with IV Rocephin and azithromycin, DuoNebs, and cough syrup. CXR consistent with bibasilar atelectasis, pneumonitis, and pleural effusion. Pulmonology consulted, felt imaging results more consistent with fluid overload than pneumonia. Continued antibiotics throughout admission, will transition to oral antibiotics upon discharge. Most recent CXR on 01/02/2022 shows no changes in bilateral infiltrates since 12/31/2021 imaging.     Nephrology consulted as patient has ESRD requiring dialysis on Monday, Wednesday, Friday. Continued with dialysis per schedule along with an extra session on 01/02/2022 for fluid overload. Patient is stable for discharge from nephrology stand-point.     Continued Lipitor, Plavix, and Imdur throughout admission for coronary artery disease associated chest pain. Patient has chronically uncontrolled hypertension, treated with clonidine, doxazosin, Norvasc, labetalol with hydralazine.     TRANSITION/POST DISCHARGE CARE/PENDING TESTS/REFERRALS: No changes in medications. Prescription sent for doxycycline for 6 days for complete antibiotic coverage for community-acquired pneumonia. Follow up with PCP within 7-10 days. Attend dialysis as scheduled. Follow up with nephrology as scheduled.     CONDITION ON DISCHARGE:  A. Ambulation: Full ambulation  B. Self-care Ability: Complete  C. Cognitive Status Alert and Oriented x 3  D. Code status at discharge: Full code      LINES/DRAINS/WOUNDS AT DISCHARGE:   Patient Lines/Drains/Airways Status       Active Line / Dialysis Catheter / Dialysis Graft / Drain / Airway / Wound       Name  Placement date Placement time Site Days    AV Fistula Left;Upper Arm 12/05/20  1502  -- 394    Central Triple Lumen Right Femoral 12/31/21  2300  -- 2    Dialysis Catheter Cuffed/Tunneled 08/13/21  1316  -- 143                    DISCHARGE DISPOSITION:  Home discharge  DISCHARGE INSTRUCTIONS:  Post-Discharge Follow Up Appointments       Wednesday Mar 26, 2022    Return Patient Visit with Beather Arbour, MD at  1:00 PM      Nephrology, [place], [place] [address]  [phone]          No discharge procedures on file.       Delos Haring, PA    Copies sent to Care Team         Relationship Specialty Notifications Start End    Deel, Leona Carry, FNP PCP - General NURSE PRACTITIONER  12/04/20     Phone: [phone] Fax: [phone]         [address] [address]    Beather Arbour, [place]  11/15/21     Phone: [phone] Fax: [phone]  [address] [place] [address]    Wilma Flavin, MD  PULMONARY DISEASE  11/15/21     Phone: [phone] Fax: [phone]         [place] [address]    Sherlyn Lees, MD  CARDIOVASCULAR DISEASE  11/15/21     Phone: [phone] Fax: [phone]         [address] [place] [address]            Referring providers can utilize [email or web address] to access their referred [place] patient's information.            The Hospitalist personally evaluated and examined the patient in conjunction with the MLP and agree with the assessments, treatment plan and disposition of the patient as recorded by the [facility].      Discharge and follow-up plan discussed with patient and family who verbalized understanding and agreed.  Time spent, 39 minutes.    [place], [place] HOSPITALIST

## 2022-01-03 NOTE — Consults (Signed)
[place]  Nephrology Consult Progress Note           Dawn, Malone  Date of Admission:  12/31/2021  Date of Birth:  1975-03-27  Date of Service:  01/03/2022    Hospital Day:  LOS: 2 days       HPI/Subjective:   [name] is a 46 y.o. female was seen in the hospital for end-stage renal disease.  Patient had dialysis earlier today.  She denies nausea or vomiting.  No chest pain.  Reports improvement in his shortness of breath.  Review of Systems  Systematic review of 12 organ systems was negative except what mentioned in in the HPI.  Vital Signs:  Temp (24hrs) Max:36.9 C (98.4 F)      Temperature: 36.9 C (98.4 F)  BP (Non-Invasive): (!) 153/84  MAP (Non-Invasive): 102 mmHG  Heart Rate: 74  Respiratory Rate: 17  SpO2: 99 %  I/O:  I/O last 24 hours:    Intake/Output Summary (Last 24 hours) at 01/03/2022 2234  Last data filed at 01/03/2022 0100  Gross per 24 hour   Intake --   Output 100 ml   Net -100 ml     Examination  Patient is alert awake and oriented not in acute distress.  Normal mood and affect.  HEENT normocephalic atraumatic.  Eye exam normal inspection.   Mucous membranes moist, no jaundice.  Neck exam no JVD normal inspection.  Cardiovascular system: Regular rate and rhythm no murmurs rubs or gallops. No chest wall tenderness  Lungs: Clear to auscultation bilaterally no wheezing no rhonchi.  Abdomen soft nontender nondistended.  Extremities no clubbing cyanosis or edema.  Neuro exam: EOMI, normal speech  Current Medications:  No current facility-administered medications for this encounter.    Labs:  BMP:   136 (11/10 0459) 96* (11/10 0459) 39* (11/10 0459)    /     112* (11/10 0459)   4.4 (11/10 0459) 28 (11/10 0459) 8.51* (11/10 0459) \             CBC:     7.4 (11/10 0459) \   8.6* (11/10 0459) /   318 (11/10 0459)      / 26.4* (11/10 0459) \          Microbiology:  Hospital Encounter on 12/31/21 (from the past 96 hour(s))   ADULT ROUTINE BLOOD CULTURE, SET OF 2  ADULT BOTTLES (BACTERIA AND YEAST)    Collection Time: 12/31/21 10:38 PM    Specimen: Blood   Culture Result Status    BLOOD CULTURE, ROUTINE No Growth 2 Days Preliminary   ADULT ROUTINE BLOOD CULTURE, SET OF 2 ADULT BOTTLES (BACTERIA AND YEAST)    Collection Time: 12/31/21 11:16 PM    Specimen: Blood   Culture Result Status    BLOOD CULTURE, ROUTINE No Growth 2 Days Preliminary     Imaging:   XR AP MOBILE CHEST  Narrative: [name]    RADIOLOGIST: Dawn Eves, MD    XR AP MOBILE CHEST performed on 01/02/2022 8:13 PM    CLINICAL HISTORY: follow up resp failure/pneumonia.  follow up resp fail    TECHNIQUE: Frontal view of the chest.    COMPARISON:  01/02/2022 at 6:12 AM    FINDINGS:  : Right internal tragus intravenous catheter as well as metallic stents are again noted.   The cardiac silhouette is stable at the upper limits of normal for size. Pulmonary vasculature is within normal limits. There  are stable ill-defined bilateral infiltrates, right greater than left.  Impression: NO SIGNIFICANT CHANGE SINCE THE PRIOR EXAM.    Radiologist location ID: [id number]  XR AP MOBILE CHEST  Narrative: [name]    PROCEDURE DESCRIPTION: XR PORTABLE CHEST X-RAY    CLINICAL HISTORY:Pneumonia    COMPARISON:12/31/2021        FINDINGS:  A single view of the chest was obtained. Bilateral infiltrates are again identified which appear somewhat improved since the last examination. The heart size is at the upper limits of normal. The tunneled hemodialysis catheter appears unchanged in position. A stent is again noted in the superior vena cava. No pleural effusion or pneumothorax is seen.      Impression: Bilateral infiltrates are again identified and appear somewhat improved since the last examination.    Radiologist location ID: [id number]    Assessment/ Plan:   Active Hospital Problems   (*Primary Problem)    Diagnosis    *Pneumonia    Hypertension    Hyperkalemia    Bilateral pneumonia    Anemia in chronic kidney  disease    Coronary artery disease involving native coronary artery    End-stage renal disease on hemodialysis (CMS HCC)           End-stage renal disease on hemodialysis  -Continue dialysis 3 times a week on MWF  -please refer to dialysis orders    Anemia  -Monitor  -Epogen     CKD MBD  -Phosphorus, Ca, PTH monitor  -binders for Phos > 5.5  -aim for Phos level 3.5-5.5  -Renal diet    Volume status-  -better continue dialysis    Pneumonia  -per primary team        Beather Arbour, MD, FASN, 01/03/2022, 22:34

## 2022-01-04 LAB — HEPATITIS C ANTIBODY SCREEN WITH REFLEX TO HCV PCR: HCV ANTIBODY QUALITATIVE: NEGATIVE

## 2022-01-05 LAB — ADULT ROUTINE BLOOD CULTURE, SET OF 2 BOTTLES (BACTERIA AND YEAST): BLOOD CULTURE, ROUTINE: NO GROWTH

## 2022-01-06 ENCOUNTER — Other Ambulatory Visit: Payer: Self-pay

## 2022-01-06 ENCOUNTER — Encounter (HOSPITAL_COMMUNITY): Payer: Self-pay

## 2022-01-06 ENCOUNTER — Emergency Department (HOSPITAL_BASED_OUTPATIENT_CLINIC_OR_DEPARTMENT_OTHER): Payer: Medicare Other

## 2022-01-06 ENCOUNTER — Observation Stay (HOSPITAL_COMMUNITY): Payer: Medicare Other | Admitting: Emergency Medicine

## 2022-01-06 ENCOUNTER — Observation Stay (HOSPITAL_COMMUNITY): Payer: Medicare Other

## 2022-01-06 ENCOUNTER — Emergency Department (EMERGENCY_DEPARTMENT_HOSPITAL): Payer: Medicare Other

## 2022-01-06 ENCOUNTER — Emergency Department (EMERGENCY_DEPARTMENT_HOSPITAL)
Admission: EM | Admit: 2022-01-06 | Discharge: 2022-01-06 | Disposition: A | Discharge status: Home / Self Care | Payer: Medicare Other | Source: Home / Self Care | Attending: Emergency Medicine | Admitting: Emergency Medicine

## 2022-01-06 ENCOUNTER — Encounter (HOSPITAL_BASED_OUTPATIENT_CLINIC_OR_DEPARTMENT_OTHER): Payer: Self-pay

## 2022-01-06 ENCOUNTER — Observation Stay
Admission: EM | Admit: 2022-01-06 | Discharge: 2022-01-09 | Disposition: A | Discharge status: Home / Self Care | Payer: Medicare Other | Attending: Emergency Medicine | Admitting: Emergency Medicine

## 2022-01-06 DIAGNOSIS — R079 Chest pain, unspecified: Secondary | ICD-10-CM

## 2022-01-06 DIAGNOSIS — F17210 Nicotine dependence, cigarettes, uncomplicated: Secondary | ICD-10-CM | POA: Insufficient documentation

## 2022-01-06 DIAGNOSIS — E8770 Fluid overload, unspecified: Secondary | ICD-10-CM

## 2022-01-06 DIAGNOSIS — R918 Other nonspecific abnormal finding of lung field: Secondary | ICD-10-CM

## 2022-01-06 DIAGNOSIS — J189 Pneumonia, unspecified organism: Secondary | ICD-10-CM

## 2022-01-06 DIAGNOSIS — I2089 Other forms of angina pectoris: Secondary | ICD-10-CM

## 2022-01-06 DIAGNOSIS — R7989 Other specified abnormal findings of blood chemistry: Secondary | ICD-10-CM

## 2022-01-06 DIAGNOSIS — I25118 Atherosclerotic heart disease of native coronary artery with other forms of angina pectoris: Secondary | ICD-10-CM | POA: Insufficient documentation

## 2022-01-06 DIAGNOSIS — Z992 Dependence on renal dialysis: Secondary | ICD-10-CM | POA: Insufficient documentation

## 2022-01-06 DIAGNOSIS — Z86718 Personal history of other venous thrombosis and embolism: Secondary | ICD-10-CM

## 2022-01-06 DIAGNOSIS — R9431 Abnormal electrocardiogram [ECG] [EKG]: Secondary | ICD-10-CM | POA: Insufficient documentation

## 2022-01-06 DIAGNOSIS — Z1152 Encounter for screening for COVID-19: Secondary | ICD-10-CM | POA: Insufficient documentation

## 2022-01-06 DIAGNOSIS — I5032 Chronic diastolic (congestive) heart failure: Secondary | ICD-10-CM | POA: Insufficient documentation

## 2022-01-06 DIAGNOSIS — Z9861 Coronary angioplasty status: Secondary | ICD-10-CM | POA: Insufficient documentation

## 2022-01-06 DIAGNOSIS — I132 Hypertensive heart and chronic kidney disease with heart failure and with stage 5 chronic kidney disease, or end stage renal disease: Secondary | ICD-10-CM | POA: Insufficient documentation

## 2022-01-06 DIAGNOSIS — Z7902 Long term (current) use of antithrombotics/antiplatelets: Secondary | ICD-10-CM | POA: Insufficient documentation

## 2022-01-06 DIAGNOSIS — Z7951 Long term (current) use of inhaled steroids: Secondary | ICD-10-CM | POA: Insufficient documentation

## 2022-01-06 DIAGNOSIS — N186 End stage renal disease: Secondary | ICD-10-CM | POA: Insufficient documentation

## 2022-01-06 DIAGNOSIS — R059 Cough, unspecified: Secondary | ICD-10-CM | POA: Insufficient documentation

## 2022-01-06 DIAGNOSIS — I2510 Atherosclerotic heart disease of native coronary artery without angina pectoris: Secondary | ICD-10-CM | POA: Insufficient documentation

## 2022-01-06 DIAGNOSIS — N189 Chronic kidney disease, unspecified: Secondary | ICD-10-CM | POA: Diagnosis present

## 2022-01-06 DIAGNOSIS — I440 Atrioventricular block, first degree: Secondary | ICD-10-CM | POA: Insufficient documentation

## 2022-01-06 DIAGNOSIS — D649 Anemia, unspecified: Secondary | ICD-10-CM | POA: Diagnosis present

## 2022-01-06 DIAGNOSIS — R0600 Dyspnea, unspecified: Secondary | ICD-10-CM | POA: Diagnosis present

## 2022-01-06 DIAGNOSIS — Z79899 Other long term (current) drug therapy: Secondary | ICD-10-CM | POA: Insufficient documentation

## 2022-01-06 DIAGNOSIS — I252 Old myocardial infarction: Secondary | ICD-10-CM

## 2022-01-06 DIAGNOSIS — I509 Heart failure, unspecified: Secondary | ICD-10-CM

## 2022-01-06 DIAGNOSIS — D631 Anemia in chronic kidney disease: Secondary | ICD-10-CM | POA: Insufficient documentation

## 2022-01-06 LAB — CBC WITH DIFF
BASOPHIL #: 0.03 10*3/uL (ref 0.00–0.30)
BASOPHIL #: 0.1 10*3/uL (ref 0.00–0.10)
BASOPHIL %: 0 % (ref 0–3)
BASOPHIL %: 1 % (ref 0–1)
EOSINOPHIL #: 0.14 10*3/uL (ref 0.00–0.80)
EOSINOPHIL #: 0.3 10*3/uL (ref 0.00–0.50)
EOSINOPHIL %: 2 % (ref 0–7)
EOSINOPHIL %: 3 %
HCT: 30.1 % — ABNORMAL LOW (ref 31.2–41.9)
HCT: 30.2 % — ABNORMAL LOW (ref 37.0–47.0)
HGB: 9.6 g/dL — ABNORMAL LOW (ref 12.5–16.0)
HGB: 9.7 g/dL — ABNORMAL LOW (ref 10.9–14.3)
LYMPHOCYTE #: 0.6 10*3/uL — ABNORMAL LOW (ref 1.00–3.00)
LYMPHOCYTE #: 0.76 10*3/uL — ABNORMAL LOW (ref 1.10–5.00)
LYMPHOCYTE %: 6 % — ABNORMAL LOW (ref 16–44)
LYMPHOCYTE %: 8 % — ABNORMAL LOW (ref 25–45)
MCH: 28.7 pg (ref 24.7–32.8)
MCH: 29 pg (ref 27.0–32.0)
MCHC: 31.6 g/dL — ABNORMAL LOW (ref 32.0–36.0)
MCHC: 32.2 g/dL — ABNORMAL LOW (ref 32.3–35.6)
MCV: 89.2 fL (ref 75.5–95.3)
MCV: 91.8 fL (ref 78.0–99.0)
MONOCYTE #: 0.5 10*3/uL (ref 0.30–1.00)
MONOCYTE #: 0.75 10*3/uL (ref 0.00–1.30)
MONOCYTE %: 5 % (ref 5–13)
MONOCYTE %: 8 % (ref 0–12)
MPV: 6.3 fL — ABNORMAL LOW (ref 7.9–10.8)
MPV: 7.8 fL (ref 7.4–10.4)
NEUTROPHIL #: 7.59 10*3/uL (ref 1.80–8.40)
NEUTROPHIL #: 9.2 10*3/uL — ABNORMAL HIGH (ref 1.85–7.80)
NEUTROPHIL %: 82 % — ABNORMAL HIGH (ref 40–76)
NEUTROPHIL %: 86 % — ABNORMAL HIGH (ref 43–77)
PLATELET COMMENT: NORMAL
PLATELETS: 333 10*3/uL (ref 140–440)
PLATELETS: 336 10*3/uL (ref 140–440)
RBC: 3.29 10*6/uL — ABNORMAL LOW (ref 4.20–5.40)
RBC: 3.37 10*6/uL — ABNORMAL LOW (ref 3.63–4.92)
RDW: 21.1 % — ABNORMAL HIGH (ref 12.3–17.7)
RDW: 21.2 % — ABNORMAL HIGH (ref 11.6–14.8)
WBC: 10.7 10*3/uL (ref 3.8–11.8)
WBC: 9.3 10*3/uL (ref 4.0–10.5)

## 2022-01-06 LAB — ECG 12 LEAD
Atrial Rate: 78 {beats}/min
Calculated P Axis: 78 degrees
Calculated R Axis: 55 degrees
Calculated T Axis: 62 degrees
PR Interval: 210 ms
QRS Duration: 98 ms
QT Interval: 430 ms
QTC Calculation: 490 ms
Ventricular rate: 78 {beats}/min

## 2022-01-06 LAB — COMPREHENSIVE METABOLIC PANEL, NON-FASTING
ALBUMIN/GLOBULIN RATIO: 0.9 (ref 0.8–1.4)
ALBUMIN/GLOBULIN RATIO: 1.2 (ref 0.8–1.4)
ALBUMIN: 3.6 g/dL (ref 3.4–5.0)
ALBUMIN: 4.5 g/dL (ref 3.5–5.7)
ALKALINE PHOSPHATASE: 90 U/L (ref 46–116)
ALKALINE PHOSPHATASE: 84 U/L (ref 34–104)
ALT (SGPT): 11 U/L (ref <=78)
ALT (SGPT): 7 U/L (ref 7–52)
ANION GAP: 16 mmol/L — ABNORMAL HIGH (ref 4–13)
ANION GAP: 16 mmol/L — ABNORMAL HIGH (ref 4–13)
AST (SGOT): 19 U/L (ref 15–37)
AST (SGOT): 14 U/L (ref 13–39)
BILIRUBIN TOTAL: 0.6 mg/dL (ref 0.2–1.0)
BILIRUBIN TOTAL: 0.7 mg/dL (ref 0.3–1.2)
BUN/CREA RATIO: 5
BUN/CREA RATIO: 5 — ABNORMAL LOW (ref 6–22)
BUN: 61 mg/dL — ABNORMAL HIGH (ref 7–18)
BUN: 66 mg/dL — ABNORMAL HIGH (ref 7–25)
CALCIUM, CORRECTED: 10 mg/dL
CALCIUM, CORRECTED: 9.7 mg/dL (ref 8.9–10.8)
CALCIUM: 9.7 mg/dL (ref 8.5–10.1)
CALCIUM: 10.1 mg/dL (ref 8.6–10.3)
CHLORIDE: 93 mmol/L — ABNORMAL LOW (ref 98–107)
CHLORIDE: 93 mmol/L — ABNORMAL LOW (ref 98–107)
CO2 TOTAL: 25 mmol/L (ref 21–32)
CO2 TOTAL: 24 mmol/L (ref 21–31)
CREATININE: 12.5 mg/dL — ABNORMAL HIGH (ref 0.55–1.02)
CREATININE: 12.74 mg/dL — ABNORMAL HIGH (ref 0.60–1.30)
ESTIMATED GFR: 3 mL/min/{1.73_m2} — ABNORMAL LOW (ref >59)
ESTIMATED GFR: 3 mL/min/{1.73_m2} — ABNORMAL LOW (ref >59)
GLOBULIN: 4.1
GLOBULIN: 3.8 (ref 2.9–5.4)
GLUCOSE: 91 mg/dL (ref 74–106)
GLUCOSE: 100 mg/dL (ref 74–109)
OSMOLALITY, CALCULATED: 285 mOsm/kg (ref 270–290)
OSMOLALITY, CALCULATED: 286 mOsm/kg (ref 270–290)
POTASSIUM: 4.7 mmol/L (ref 3.5–5.1)
POTASSIUM: 4.5 mmol/L (ref 3.5–5.1)
PROTEIN TOTAL: 7.7 g/dL (ref 6.4–8.2)
PROTEIN TOTAL: 8.3 g/dL (ref 6.4–8.9)
SODIUM: 134 mmol/L — ABNORMAL LOW (ref 136–145)
SODIUM: 133 mmol/L — ABNORMAL LOW (ref 136–145)

## 2022-01-06 LAB — D-DIMER: D-DIMER: 2268 ng/mL FEU (ref 215–500)

## 2022-01-06 LAB — TROPONIN-I
TROPONIN I: 216 ng/L — ABNORMAL HIGH (ref <15)
TROPONIN I: 224 ng/L — ABNORMAL HIGH (ref <15)
TROPONIN I: 179 ng/L — ABNORMAL HIGH (ref <15)
TROPONIN I: 170 ng/L — ABNORMAL HIGH (ref <15)
TROPONIN I: 180 ng/L — ABNORMAL HIGH (ref <15)

## 2022-01-06 LAB — PTT (PARTIAL THROMBOPLASTIN TIME): APTT: 69.9 seconds — ABNORMAL HIGH (ref 26.0–36.0)

## 2022-01-06 LAB — PT/INR
INR: 1.1 (ref <=5.00)
PROTHROMBIN TIME: 12.7 seconds (ref 9.8–12.7)

## 2022-01-06 LAB — COVID-19, FLU A/B, RSV RAPID BY PCR
INFLUENZA VIRUS TYPE A: NOT DETECTED
INFLUENZA VIRUS TYPE B: NOT DETECTED
RESPIRATORY SYNCTIAL VIRUS (RSV): NOT DETECTED
SARS-CoV-2: NOT DETECTED

## 2022-01-06 LAB — B-TYPE NATRIURETIC PEPTIDE (BNP),PLASMA: BNP: 1498 pg/mL — ABNORMAL HIGH (ref 5–100)

## 2022-01-06 LAB — MAGNESIUM: MAGNESIUM: 2 mg/dL (ref 1.9–2.7)

## 2022-01-06 LAB — POC BLOOD GLUCOSE (RESULTS): GLUCOSE, POC: 96 mg/dl (ref 50–500)

## 2022-01-06 LAB — C-REACTIVE PROTEIN (CRP): C-REACTIVE PROTEIN (CRP): 2.6 mg/dL — ABNORMAL HIGH (ref 0.1–0.5)

## 2022-01-06 LAB — LIPASE: LIPASE: 48 U/L (ref 11–82)

## 2022-01-06 LAB — THYROID STIMULATING HORMONE WITH FREE T4 REFLEX: TSH: 1.5 u[IU]/mL (ref 0.450–5.330)

## 2022-01-06 LAB — LACTIC ACID LEVEL W/ REFLEX FOR LEVEL >2.0: LACTIC ACID: 0.5 mmol/L (ref 0.5–2.2)

## 2022-01-06 LAB — ADULT ROUTINE BLOOD CULTURE, SET OF 2 BOTTLES (BACTERIA AND YEAST): BLOOD CULTURE, ROUTINE: NO GROWTH

## 2022-01-06 MED ORDER — ASPIRIN 81 MG CHEWABLE TABLET
324.0000 mg | CHEWABLE_TABLET | ORAL | Status: DC
Start: 2022-01-06 — End: 2022-01-06
  Administered 2022-01-06: 0 mg via ORAL

## 2022-01-06 MED ORDER — LIDOCAINE 4 % TOPICAL PATCH
1.0000 | MEDICATED_PATCH | CUTANEOUS | Status: DC
Start: 2022-01-06 — End: 2022-01-06
  Administered 2022-01-06: 1 via TRANSDERMAL

## 2022-01-06 MED ORDER — TRAMADOL 50 MG TABLET
50.0000 mg | ORAL_TABLET | Freq: Four times a day (QID) | ORAL | Status: DC | PRN
Start: 2022-01-06 — End: 2022-01-09
  Administered 2022-01-08: 50 mg via ORAL
  Filled 2022-01-06: qty 1

## 2022-01-06 MED ORDER — LABETALOL 200 MG TABLET
400.0000 mg | ORAL_TABLET | Freq: Three times a day (TID) | ORAL | Status: DC
Start: 2022-01-06 — End: 2022-01-09
  Administered 2022-01-06 – 2022-01-07 (×2): 400 mg via ORAL
  Administered 2022-01-07: 0 mg via ORAL
  Administered 2022-01-07 – 2022-01-08 (×3): 400 mg via ORAL
  Administered 2022-01-08: 0 mg via ORAL
  Filled 2022-01-06 (×8): qty 2

## 2022-01-06 MED ORDER — HYDRALAZINE 25 MG TABLET
100.0000 mg | ORAL_TABLET | Freq: Three times a day (TID) | ORAL | Status: DC
Start: 2022-01-06 — End: 2022-01-06
  Administered 2022-01-06: 100 mg via ORAL
  Filled 2022-01-06: qty 4

## 2022-01-06 MED ORDER — CLONIDINE HCL 0.1 MG TABLET
0.1000 mg | ORAL_TABLET | ORAL | Status: DC
Start: 2022-01-06 — End: 2022-01-09
  Administered 2022-01-06: 0 mg via ORAL

## 2022-01-06 MED ORDER — CLOPIDOGREL 75 MG TABLET
75.0000 mg | ORAL_TABLET | Freq: Every day | ORAL | Status: DC
Start: 2022-01-06 — End: 2022-01-09
  Administered 2022-01-06 – 2022-01-08 (×3): 75 mg via ORAL
  Filled 2022-01-06 (×3): qty 1

## 2022-01-06 MED ORDER — CLONIDINE HCL 0.1 MG TABLET
0.2000 mg | ORAL_TABLET | Freq: Two times a day (BID) | ORAL | Status: DC
Start: 2022-01-06 — End: 2022-01-09
  Administered 2022-01-06 – 2022-01-08 (×5): 0.2 mg via ORAL
  Filled 2022-01-06 (×5): qty 2

## 2022-01-06 MED ORDER — FAMOTIDINE 20 MG TABLET
40.0000 mg | ORAL_TABLET | Freq: Every day | ORAL | Status: DC
Start: 2022-01-06 — End: 2022-01-09
  Administered 2022-01-06 – 2022-01-08 (×3): 40 mg via ORAL
  Filled 2022-01-06 (×3): qty 2

## 2022-01-06 MED ORDER — FUROSEMIDE 40 MG TABLET
160.0000 mg | ORAL_TABLET | Freq: Every day | ORAL | Status: DC
Start: 2022-01-06 — End: 2022-01-09
  Administered 2022-01-06 – 2022-01-08 (×3): 160 mg via ORAL
  Filled 2022-01-06 (×3): qty 4

## 2022-01-06 MED ORDER — HYDROMORPHONE 2 MG/ML INJECTION WRAPPER
0.5000 mg | INJECTION | Freq: Once | INTRAMUSCULAR | Status: AC
Start: 2022-01-07 — End: 2022-01-07
  Administered 2022-01-07: 0.5 mg via INTRAVENOUS
  Filled 2022-01-06: qty 1

## 2022-01-06 MED ORDER — CALCIUM ACETATE(PHOSPHATE BINDERS) 667 MG CAPSULE
1334.0000 mg | ORAL_CAPSULE | Freq: Three times a day (TID) | ORAL | Status: DC
Start: 2022-01-06 — End: 2022-01-09
  Administered 2022-01-06: 1334 mg via ORAL
  Administered 2022-01-07 – 2022-01-08 (×6): 0 mg via ORAL
  Filled 2022-01-06 (×7): qty 2

## 2022-01-06 MED ORDER — ISOSORBIDE MONONITRATE ER 60 MG TABLET,EXTENDED RELEASE 24 HR
120.0000 mg | ORAL_TABLET | Freq: Every morning | ORAL | Status: AC
Start: 2022-01-07 — End: 2022-01-07
  Administered 2022-01-07: 120 mg via ORAL
  Filled 2022-01-06: qty 2

## 2022-01-06 MED ORDER — CALCITRIOL 0.25 MCG CAPSULE
1.0000 ug | ORAL_CAPSULE | ORAL | Status: DC
Start: 2022-01-08 — End: 2022-01-09
  Administered 2022-01-08: 1 ug via ORAL
  Filled 2022-01-06: qty 4

## 2022-01-06 MED ORDER — LIDOCAINE 4 % TOPICAL PATCH
MEDICATED_PATCH | CUTANEOUS | Status: AC
Start: 2022-01-06 — End: 2022-01-06
  Filled 2022-01-06: qty 1

## 2022-01-06 MED ORDER — IOHEXOL 350 MG IODINE/ML INTRAVENOUS SOLUTION
100.0000 mL | INTRAVENOUS | Status: AC
Start: 2022-01-06 — End: 2022-01-06
  Administered 2022-01-06: 100 mL via INTRAVENOUS

## 2022-01-06 MED ORDER — DOXAZOSIN 4 MG TABLET
4.0000 mg | ORAL_TABLET | Freq: Every evening | ORAL | Status: DC
Start: 2022-01-06 — End: 2022-01-09
  Administered 2022-01-06 – 2022-01-08 (×3): 4 mg via ORAL
  Filled 2022-01-06 (×4): qty 1

## 2022-01-06 MED ORDER — LOSARTAN 50 MG TABLET
100.0000 mg | ORAL_TABLET | Freq: Every day | ORAL | Status: DC
Start: 2022-01-06 — End: 2022-01-09
  Administered 2022-01-06: 0 mg via ORAL
  Administered 2022-01-07 – 2022-01-08 (×2): 100 mg via ORAL
  Filled 2022-01-06 (×2): qty 2

## 2022-01-06 MED ORDER — CLONIDINE HCL 0.1 MG TABLET
ORAL_TABLET | ORAL | Status: AC
Start: 2022-01-06 — End: 2022-01-06
  Filled 2022-01-06: qty 1

## 2022-01-06 MED ORDER — ONDANSETRON 4 MG DISINTEGRATING TABLET
4.0000 mg | ORAL_TABLET | Freq: Three times a day (TID) | ORAL | Status: DC | PRN
Start: 2022-01-06 — End: 2022-01-09
  Administered 2022-01-07: 4 mg via ORAL
  Filled 2022-01-06: qty 1

## 2022-01-06 MED ORDER — NITROGLYCERIN 0.4 MG SUBLINGUAL TABLET
0.4000 mg | SUBLINGUAL_TABLET | SUBLINGUAL | Status: DC | PRN
Start: 2022-01-06 — End: 2022-01-06

## 2022-01-06 MED ORDER — MONTELUKAST 10 MG TABLET
10.0000 mg | ORAL_TABLET | Freq: Every evening | ORAL | Status: DC
Start: 2022-01-06 — End: 2022-01-09
  Administered 2022-01-06 – 2022-01-08 (×3): 10 mg via ORAL
  Filled 2022-01-06 (×3): qty 1

## 2022-01-06 MED ORDER — IPRATROPIUM 0.5 MG-ALBUTEROL 3 MG (2.5 MG BASE)/3 ML NEBULIZATION SOLN
3.0000 mL | INHALATION_SOLUTION | Freq: Four times a day (QID) | RESPIRATORY_TRACT | Status: DC | PRN
Start: 2022-01-06 — End: 2022-01-09

## 2022-01-06 MED ORDER — FENTANYL (PF) 50 MCG/ML INJECTION SOLUTION
75.0000 ug | INTRAMUSCULAR | Status: AC
Start: 2022-01-06 — End: 2022-01-06
  Administered 2022-01-06: 75 ug via INTRAVENOUS

## 2022-01-06 MED ORDER — CINACALCET 30 MG TABLET
30.0000 mg | ORAL_TABLET | Freq: Every evening | ORAL | Status: DC
Start: 2022-01-06 — End: 2022-01-09
  Administered 2022-01-06 – 2022-01-08 (×3): 30 mg via ORAL
  Filled 2022-01-06 (×3): qty 1

## 2022-01-06 MED ORDER — HYDRALAZINE 50 MG TABLET
100.0000 mg | ORAL_TABLET | Freq: Three times a day (TID) | ORAL | Status: DC
Start: 2022-01-07 — End: 2022-01-09
  Administered 2022-01-07 – 2022-01-08 (×5): 100 mg via ORAL
  Administered 2022-01-08: 0 mg via ORAL
  Administered 2022-01-09: 100 mg via ORAL
  Filled 2022-01-06 (×6): qty 2

## 2022-01-06 MED ORDER — PANTOPRAZOLE 40 MG TABLET,DELAYED RELEASE
40.0000 mg | DELAYED_RELEASE_TABLET | Freq: Every day | ORAL | Status: DC
Start: 2022-01-06 — End: 2022-01-09
  Administered 2022-01-06 – 2022-01-08 (×3): 40 mg via ORAL
  Filled 2022-01-06 (×3): qty 1

## 2022-01-06 MED ORDER — FENTANYL (PF) 50 MCG/ML INJECTION SOLUTION
INTRAMUSCULAR | Status: AC
Start: 2022-01-06 — End: 2022-01-06
  Filled 2022-01-06: qty 2

## 2022-01-06 MED ORDER — ACETAMINOPHEN 325 MG TABLET
650.0000 mg | ORAL_TABLET | ORAL | Status: DC | PRN
Start: 2022-01-06 — End: 2022-01-09

## 2022-01-06 MED ORDER — BUDESONIDE-FORMOTEROL HFA 160 MCG-4.5 MCG/ACTUATION AEROSOL INHALER
2.0000 | INHALATION_SPRAY | Freq: Two times a day (BID) | RESPIRATORY_TRACT | Status: DC
Start: 2022-01-06 — End: 2022-01-09
  Administered 2022-01-06 – 2022-01-07 (×3): 0 via RESPIRATORY_TRACT
  Administered 2022-01-08 (×2): 2 via RESPIRATORY_TRACT
  Filled 2022-01-06: qty 12

## 2022-01-06 MED ORDER — CYCLOBENZAPRINE 10 MG TABLET
5.0000 mg | ORAL_TABLET | Freq: Two times a day (BID) | ORAL | Status: DC | PRN
Start: 2022-01-06 — End: 2022-01-09
  Administered 2022-01-09: 5 mg via ORAL
  Filled 2022-01-06: qty 1

## 2022-01-06 MED ORDER — DOXYCYCLINE HYCLATE 100 MG TABLET
ORAL_TABLET | ORAL | Status: AC
Start: 2022-01-06 — End: 2022-01-06
  Filled 2022-01-06: qty 1

## 2022-01-06 MED ORDER — AMLODIPINE 5 MG TABLET
10.0000 mg | ORAL_TABLET | Freq: Every day | ORAL | Status: DC
Start: 2022-01-06 — End: 2022-01-09
  Administered 2022-01-06: 0 mg via ORAL
  Administered 2022-01-07 – 2022-01-08 (×2): 10 mg via ORAL
  Filled 2022-01-06 (×2): qty 2

## 2022-01-06 MED ORDER — DOXYCYCLINE HYCLATE 100 MG TABLET
100.0000 mg | ORAL_TABLET | ORAL | Status: AC
Start: 2022-01-06 — End: 2022-01-06
  Administered 2022-01-06: 100 mg via ORAL

## 2022-01-06 NOTE — H&P (Signed)
[facility]  Admission H&P    Date of Service:  01/06/2022  [name], [name], 46 y.o. female  Encounter Start Date:  01/06/2022  Inpatient Admission Date:   Date of Birth:  October 09, 1975  PCP: Cam Hai, FNP        Chief Complaint:  Shortness of breath    HPI: [name] is a 46 y.o., 56 American female who is well known to hospitalist service with multiple admissions including a recent 1 as of last week  She presents today with complaint of shortness a breath  Denies any chest neck arm back or jaw pain  Has chronic orthopnea and states she stays in the sitting position "essentially 24/7"  She denies any fever chills  Has a nonproductive cough  Good appetite, denies any abdominal pain nausea vomiting or diarrhea  Chronic dialysis patient M/W/F  ER has spoken with Dr. Liliane Shi who advised admission for dialysis today    Assessment/plan:  ** ACUTE ON CHRONIC DYSPNEA  ** ESRD, DIALYSIS M/W/F  ** HYPERTENSION  ** ANEMIA CHRONIC DISEASE  ** CAD    Admit to medical service  Nephrology consult as above  Home meds as appropriate  Likely discharge in a.m.        BMP:   133* (11/13 1220) 93* (11/13 1220) 66* (11/13 1220)    /     100 (11/13 1220)   4.5 (11/13 1220) 24 (11/13 1220) 12.74* (11/13 1220) \          CBC:     10.7 (11/13 1221) \   9.7* (11/13 1221) /   333 (11/13 1221)      / 30.1* (11/13 1221) \                               PAST MEDICAL:    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    Tricuspid valve regurgitation     Uncontrolled hypertension         Past Surgical  History:   Procedure Laterality Date    CORONARY ARTERY ANGIOPLASTY      ESOPHAGOGASTRODUODENOSCOPY      HX BACK SURGERY      HX CHOLECYSTECTOMY      HX FOOT SURGERY Right     HX HYSTERECTOMY      HX TONSILLECTOMY              Medications Prior to Admission       Prescriptions    acetaminophen (TYLENOL) 325 mg Oral Tablet    Take 2 Tablets (650 mg total) by mouth Every 4 hours as needed    amLODIPine (NORVASC) 10 mg Oral Tablet    Take 1 Tablet (10 mg total) by mouth Once a day Take one tablet every day    budesonide-formoteroL (SYMBICORT) 160-4.5 mcg/actuation Inhalation oral inhaler  Take 2 Puffs by inhalation Twice daily    calcitrioL (ROCALTROL) 0.5 mcg Oral Capsule    Take 2 Capsules (1 mcg total) by mouth Every Monday, Wednesday and Friday    calcium acetate,phosphat bind, (PHOSLO) 667 mg Oral Capsule    Take 2 Capsules (1,334 mg total) by mouth Three times daily with meals for 30 days    cinacalcet (SENSIPAR) 30 mg Oral Tablet    Take 1 Tablet (30 mg total) by mouth Every evening    cloNIDine (CATAPRES-TTS) 0.3 mg/24 hr Transdermal Patch Weekly    Place 1 Patch (0.3 mg total) on the skin Every 7 days    cloNIDine HCL (CATAPRES) 0.2 mg Oral Tablet    Take 1 Tablet (0.2 mg total) by mouth Twice daily Take one tablet twice a day    clopidogreL (PLAVIX) 75 mg Oral Tablet    Take 1 Tablet (75 mg total) by mouth Once a day    cyclobenzaprine (FLEXERIL) 5 mg Oral Tablet    Take 1 Tablet (5 mg total) by mouth Twice per day as needed for Muscle spasms    doxazosin (CARDURA) 4 mg Oral Tablet    Take 1 Tablet (4 mg total) by mouth Every evening    doxycycline monohydrate (MONODOX) 100 mg Oral Capsule    Take 1 Capsule (100 mg total) by mouth Twice daily for 6 days    famotidine (PEPCID) 40 mg Oral Tablet    Take 1 Tablet (40 mg total) by mouth Once a day    furosemide (LASIX) 80 mg Oral Tablet    Take 2 Tablets (160 mg total) by mouth Once a day Take after dialysis on dialysis days; otherwise take in morning daily.     hydrALAZINE (APRESOLINE) 100 mg Oral Tablet    Take 1 Tablet (100 mg total) by mouth Every 8 hours for 30 days    ipratropium-albuterol 0.5 mg-3 mg(2.5 mg base)/3 mL Solution for Nebulization    Take 3 mL by nebulization Four times a day as needed    isosorbide mononitrate (IMDUR) 120 mg Oral Tablet Sustained Release 24 hr    Take 1 Tablet (120 mg total) by mouth Every morning for 30 days    labetaloL (NORMODYNE) 200 mg Oral Tablet    Take 2 Tablets (400 mg total) by mouth Three times a day    losartan (COZAAR) 100 mg Oral Tablet    Take 1 Tablet (100 mg total) by mouth Once a day    montelukast (SINGULAIR) 10 mg Oral Tablet    Take 1 Tablet (10 mg total) by mouth Every evening    nitroGLYCERIN (NITROSTAT) 0.4 mg Sublingual Tablet, Sublingual    Place 1 Tablet (0.4 mg total) under the tongue Every 5 minutes as needed for Chest pain for up to 3 doses for 3 doses over 15 minutes    omeprazole (PRILOSEC) 40 mg Oral Capsule, Delayed Release(E.C.)    Take 1 Capsule (40 mg total) by mouth Once a day    ondansetron (ZOFRAN ODT) 4 mg Oral Tablet, Rapid Dissolve    Take 1 Tablet (4 mg total) by mouth Every 8 hours as needed for Nausea/Vomiting    pantoprazole (PROTONIX) 40 mg Oral Tablet, Delayed Release (E.C.)    Take 1 Tablet (40 mg total) by mouth Once a day    rosuvastatin (CRESTOR) 40 mg Oral Tablet    Take 1 Tablet (40 mg total) by mouth Every evening Take one tablet every day  traMADoL (ULTRAM) 50 mg Oral Tablet    Take 1 Tablet (50 mg total) by mouth Every 6 hours as needed for Pain          Allergies   Allergen Reactions    Lisinopril Swelling     Tongue and throat swelling    Cardene [Nicardipine]  Other Adverse Reaction (Add comment)     Chest pain     Oxycodone Itching       Family History:  Family Medical History:       Problem Relation (Age of Onset)    Breast Cancer Mother    Coronary Artery Disease Mother    Diabetes type II Mother    Hypertension (High Blood Pressure) Father               Social  History:  Social History     Tobacco Use    Smoking status: Every Day     Packs/day: 1.00     Years: 10.00     Additional pack years: 0.00     Total pack years: 10.00     Types: Cigarettes     Passive exposure: Past    Smokeless tobacco: Former    Tobacco comments:     "Haven't had a cigerette in 2 weeks."   Vaping Use    Vaping Use: Never used   Substance Use Topics    Alcohol use: Not Currently    Drug use: Yes     Types: Marijuana        Review of Systems:  ROS as above otherwise noncontributing    Examination:  Temperature: (!) 35.9 C (96.7 F) Heart Rate: 71 BP (Non-Invasive): (!) 140/103   Respiratory Rate: 15 SpO2: 98 %     Physical Exam   General well-developed well-nourished black female in no acute distress  HEENT normocephalic atraumatic membranes dry  Neck supple  Chest equal expansion  Lungs scattered rhonchi  Heart regular rate and rhythm  Abdomen soft nontender nondistended  Extremities negative for edema  Neuro alert oriented no gross deficits    Labs:    I have reviewed all lab results.  Lab Results Today:    Results for orders placed or performed during the hospital encounter of 01/06/22 (from the past 24 hour(s))   COVID-19, FLU A/B, RSV RAPID BY PCR   Result Value Ref Range    SARS-CoV-2 Not Detected Not Detected    INFLUENZA VIRUS TYPE A Not Detected Not Detected    INFLUENZA VIRUS TYPE B Not Detected Not Detected    RESPIRATORY SYNCTIAL VIRUS (RSV) Not Detected Not Detected   POC BLOOD GLUCOSE (RESULTS)   Result Value Ref Range    GLUCOSE, POC 96 50 - 500 mg/dl   TROPONIN NOW   Result Value Ref Range    TROPONIN I 179 (H) <15 ng/L   THYROID STIMULATING HORMONE WITH FREE T4 REFLEX   Result Value Ref Range    TSH 1.500 0.450 - 5.330 uIU/mL   LIPASE   Result Value Ref Range    LIPASE 48 11 - 82 U/L   MAGNESIUM   Result Value Ref Range    MAGNESIUM 2.0 1.9 - 2.7 mg/dL   PT/INR   Result Value Ref Range    PROTHROMBIN TIME 12.7 9.8 - 12.7 seconds    INR 1.10 <=5.00   PTT (PARTIAL THROMBOPLASTIN  TIME)   Result Value Ref Range    APTT 69.9 (H) 26.0 - 36.0 seconds   COMPREHENSIVE  METABOLIC PANEL, NON-FASTING   Result Value Ref Range    SODIUM 133 (L) 136 - 145 mmol/L    POTASSIUM 4.5 3.5 - 5.1 mmol/L    CHLORIDE 93 (L) 98 - 107 mmol/L    CO2 TOTAL 24 21 - 31 mmol/L    ANION GAP 16 (H) 4 - 13 mmol/L    BUN 66 (H) 7 - 25 mg/dL    CREATININE 12.74 (H) 0.60 - 1.30 mg/dL    BUN/CREA RATIO 5 (L) 6 - 22    ESTIMATED GFR 3 (L) >59 mL/min/1.71m^2    ALBUMIN 4.5 3.5 - 5.7 g/dL    CALCIUM 10.1 8.6 - 10.3 mg/dL    GLUCOSE 100 74 - 109 mg/dL    ALKALINE PHOSPHATASE 84 34 - 104 U/L    ALT (SGPT) 7 7 - 52 U/L    AST (SGOT) 14 13 - 39 U/L    BILIRUBIN TOTAL 0.7 0.3 - 1.2 mg/dL    PROTEIN TOTAL 8.3 6.4 - 8.9 g/dL    ALBUMIN/GLOBULIN RATIO 1.2 0.8 - 1.4    OSMOLALITY, CALCULATED 286 270 - 290 mOsm/kg    CALCIUM, CORRECTED 9.7 8.9 - 10.8 mg/dL    GLOBULIN 3.8 2.9 - 5.4   C-REACTIVE PROTEIN (CRP)   Result Value Ref Range    C-REACTIVE PROTEIN (CRP) 2.6 (H) 0.1 - 0.5 mg/dL   D-DIMER   Result Value Ref Range    D-DIMER 2,268 (HH) 215 - 500 ng/mL FEU   LACTIC ACID LEVEL W/ REFLEX FOR LEVEL >2.0   Result Value Ref Range    LACTIC ACID 0.5 0.5 - 2.2 mmol/L   B-TYPE NATRIURETIC PEPTIDE   Result Value Ref Range    BNP 1,498 (H) 5 - 100 pg/mL   CBC WITH DIFF   Result Value Ref Range    WBC 10.7 3.8 - 11.8 x10^3/uL    RBC 3.37 (L) 3.63 - 4.92 x10^6/uL    HGB 9.7 (L) 10.9 - 14.3 g/dL    HCT 30.1 (L) 31.2 - 41.9 %    MCV 89.2 75.5 - 95.3 fL    MCH 28.7 24.7 - 32.8 pg    MCHC 32.2 (L) 32.3 - 35.6 g/dL    RDW 21.1 (H) 12.3 - 17.7 %    PLATELETS 333 140 - 440 x10^3/uL    MPV 6.3 (L) 7.9 - 10.8 fL    NEUTROPHIL % 86 (H) 43 - 77 %    LYMPHOCYTE % 6 (L) 16 - 44 %    MONOCYTE % 5 5 - 13 %    EOSINOPHIL % 3 %    BASOPHIL % 1 0 - 1 %    NEUTROPHIL # 9.20 (H) 1.85 - 7.80 x10^3/uL    LYMPHOCYTE # 0.60 (L) 1.00 - 3.00 x10^3/uL    MONOCYTE # 0.50 0.30 - 1.00 x10^3/uL    EOSINOPHIL # 0.30 0.00 - 0.50 x10^3/uL    BASOPHIL # 0.10 0.00 - 0.10 x10^3/uL     RBC COMMENT Moderate Anisocytosis     PLATELET COMMENT Normal    TROPONIN IN ONE HOUR   Result Value Ref Range    TROPONIN I 170 (H) <15 ng/L   Results for orders placed or performed during the hospital encounter of 01/06/22 (from the past 24 hour(s))   ECG 12 LEAD   Result Value Ref Range    Ventricular rate 78 BPM    Atrial Rate 78 BPM    PR Interval 210 ms  QRS Duration 98 ms    QT Interval 430 ms    QTC Calculation 490 ms    Calculated P Axis 78 degrees    Calculated R Axis 55 degrees    Calculated T Axis 62 degrees   COMPREHENSIVE METABOLIC PANEL, NON-FASTING   Result Value Ref Range    SODIUM 134 (L) 136 - 145 mmol/L    POTASSIUM 4.7 3.5 - 5.1 mmol/L    CHLORIDE 93 (L) 98 - 107 mmol/L    CO2 TOTAL 25 21 - 32 mmol/L    ANION GAP 16 (H) 4 - 13 mmol/L    BUN 61 (H) 7 - 18 mg/dL    CREATININE 12.50 (H) 0.55 - 1.02 mg/dL    BUN/CREA RATIO 5     ESTIMATED GFR 3 (L) >59 mL/min/1.2m^2    ALBUMIN 3.6 3.4 - 5.0 g/dL    CALCIUM 9.7 8.5 - 10.1 mg/dL    GLUCOSE 91 74 - 106 mg/dL    ALKALINE PHOSPHATASE 90 46 - 116 U/L    ALT (SGPT) 11 <=78 U/L    AST (SGOT) 19 15 - 37 U/L    BILIRUBIN TOTAL 0.6 0.2 - 1.0 mg/dL    PROTEIN TOTAL 7.7 6.4 - 8.2 g/dL    ALBUMIN/GLOBULIN RATIO 0.9 0.8 - 1.4    OSMOLALITY, CALCULATED 285 270 - 290 mOsm/kg    CALCIUM, CORRECTED 10.0 mg/dL    GLOBULIN 4.1    TROPONIN-I NOW   Result Value Ref Range    TROPONIN I 216 (H) <15 ng/L   CBC WITH DIFF   Result Value Ref Range    WBC 9.3 4.0 - 10.5 x10^3/uL    RBC 3.29 (L) 4.20 - 5.40 x10^6/uL    HGB 9.6 (L) 12.5 - 16.0 g/dL    HCT 30.2 (L) 37.0 - 47.0 %    MCV 91.8 78.0 - 99.0 fL    MCH 29.0 27.0 - 32.0 pg    MCHC 31.6 (L) 32.0 - 36.0 g/dL    RDW 21.2 (H) 11.6 - 14.8 %    PLATELETS 336 140 - 440 x10^3/uL    MPV 7.8 7.4 - 10.4 fL    NEUTROPHIL % 82 (H) 40 - 76 %    LYMPHOCYTE % 8 (L) 25 - 45 %    MONOCYTE % 8 0 - 12 %    EOSINOPHIL % 2 0 - 7 %    BASOPHIL % 0 0 - 3 %    NEUTROPHIL # 7.59 1.80 - 8.40 x10^3/uL    LYMPHOCYTE # 0.76 (L) 1.10 - 5.00 x10^3/uL     MONOCYTE # 0.75 0.00 - 1.30 x10^3/uL    EOSINOPHIL # 0.14 0.00 - 0.80 x10^3/uL    BASOPHIL # 0.03 0.00 - 0.30 x10^3/uL   TROPONIN-I IN ONE HOUR   Result Value Ref Range    TROPONIN I 224 (H) <15 ng/L       Imaging Studies:    No results found.  XR AP MOBILE CHEST   Final Result   Patchy infiltrates are identified in the mid the lower lungs bilaterally which appear somewhat worse than on the previous study and may reflect acute pneumonia.         Radiologist location ID: [id number]              DNR Status:  Prior    Assessment/Plan:   There are no active hospital problems to display for this patient.  DVT/PE Prophylaxis:     Felecia Jan MD

## 2022-01-06 NOTE — Consults (Signed)
[facility]    NEPHROLOGY CONSULTATION NOTE       [name] 46 y.o. female ERA/ERA   Date of Service: 01/06/2022    Date of Admission:  01/06/2022   PCP: Cam Hai, FNP Code Status:Full Code       Reason for Consultation:  ESRD on HD     HPI:   46 year old female with past medical history of heart disease, ESRD on HD, hypertension presented to the hospital with shortness of breath, patient reported that she is not following fluid restrictions.  Patient's symptoms has been going since yesterday evening was started getting worse decided to come to hospital for further evaluation.  Patient was due for dialysis today as she gets dialysis Monday Wednesday and Friday she could not make it to dialysis.  Further workup indicated fluid overload chest pain and shortness of breath.      ROS:   Systematic review of 12 organ systems was negative except what mentioned in in the HPI.    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    Tricuspid valve regurgitation     Uncontrolled hypertension         PSHx:   Past Surgical History:   Procedure Laterality Date    CORONARY ARTERY ANGIOPLASTY      ESOPHAGOGASTRODUODENOSCOPY      HX BACK SURGERY      HX CHOLECYSTECTOMY      HX FOOT SURGERY Right     HX HYSTERECTOMY      HX TONSILLECTOMY            Allergies:    Allergies   Allergen Reactions    Lisinopril Swelling     Tongue and throat swelling     Cardene [Nicardipine]  Other Adverse Reaction (Add comment)     Chest pain     Oxycodone Itching    Social History  Social History     Tobacco Use    Smoking status: Every Day     Packs/day: 1.00     Years: 10.00     Additional pack years: 0.00     Total pack years: 10.00     Types: Cigarettes     Passive exposure: Past    Smokeless tobacco: Former    Tobacco comments:     "Haven't had a cigerette in 2 weeks."   Vaping Use    Vaping Use: Never used   Substance Use Topics    Alcohol use: Not Currently    Drug use: Yes     Types: Marijuana       Family History  Family Medical History:       Problem Relation (Age of Onset)    Breast  Cancer Mother    Coronary Artery Disease Mother    Diabetes type II Mother    Hypertension (High Blood Pressure) Father               Home Meds:      Prior to Admission medications    Medication Sig Start Date End Date Taking? Authorizing Provider   acetaminophen (TYLENOL) 325 mg Oral Tablet Take 2 Tablets (650 mg total) by mouth Every 4 hours as needed 08/19/21  Yes Scudiere, Loa Socks, APRN   amLODIPine (NORVASC) 10 mg Oral Tablet Take 1 Tablet (10 mg total) by mouth Once a day Take one tablet every day   Yes Provider, Historical   budesonide-formoteroL (SYMBICORT) 160-4.5 mcg/actuation Inhalation oral inhaler Take 2 Puffs by inhalation Twice daily   Yes Provider, Historical   calcitrioL (ROCALTROL) 0.5 mcg Oral Capsule Take 2 Capsules (1 mcg total) by mouth Every Monday, Wednesday and Friday   Yes Provider, Historical   calcium acetate,phosphat bind, (PHOSLO) 667 mg Oral Capsule Take 2 Capsules (1,334 mg total) by mouth Three times daily with meals for 30 days 01/03/22 02/02/22 Yes Maxey, Speed, PA   cinacalcet (SENSIPAR) 30 mg Oral Tablet Take 1 Tablet (30 mg total) by mouth Every evening   Yes Provider, Historical   cloNIDine (CATAPRES-TTS) 0.3 mg/24 hr Transdermal Patch Weekly Place 1 Patch (0.3 mg total) on the skin Every 7 days 08/02/21  Yes Provider, Historical   cloNIDine HCL (CATAPRES)  0.2 mg Oral Tablet Take 1 Tablet (0.2 mg total) by mouth Twice daily Take one tablet twice a day   Yes Provider, Historical   clopidogreL (PLAVIX) 75 mg Oral Tablet Take 1 Tablet (75 mg total) by mouth Once a day   Yes Provider, Historical   cyclobenzaprine (FLEXERIL) 5 mg Oral Tablet Take 1 Tablet (5 mg total) by mouth Twice per day as needed for Muscle spasms   Yes Provider, Historical   doxazosin (CARDURA) 4 mg Oral Tablet Take 1 Tablet (4 mg total) by mouth Every evening   Yes Provider, Historical   doxycycline monohydrate (MONODOX) 100 mg Oral Capsule Take 1 Capsule (100 mg total) by mouth Twice daily for 6 days 01/03/22 01/09/22 Yes Maxey, Rhae Lerner, PA   famotidine (PEPCID) 40 mg Oral Tablet Take 1 Tablet (40 mg total) by mouth Once a day   Yes Provider, Historical   furosemide (LASIX) 80 mg Oral Tablet Take 2 Tablets (160 mg total) by mouth Once a day Take after dialysis on dialysis days; otherwise take in morning daily.   Yes Provider, Historical   hydrALAZINE (APRESOLINE) 100 mg Oral Tablet Take 1 Tablet (100 mg total) by mouth Every 8 hours for 30 days 10/21/21 01/06/22 Yes Reyes, Razelle, DO   ipratropium-albuterol 0.5 mg-3 mg(2.5 mg base)/3 mL Solution for Nebulization Take 3 mL by nebulization Four times a day as needed 10/03/21  Yes Sherol Dade, PA-C   isosorbide mononitrate (IMDUR) 120 mg Oral Tablet Sustained Release 24 hr Take 1 Tablet (120 mg total) by mouth Every morning for 30 days 10/22/21 01/06/22 Yes Reyes, Razelle, DO   labetaloL (NORMODYNE) 200 mg Oral Tablet Take 2 Tablets (400 mg total) by mouth Three times a day   Yes Provider, Historical   losartan (COZAAR) 100 mg Oral Tablet Take 1 Tablet (100 mg total) by mouth Once a day   Yes Provider, Historical   montelukast (SINGULAIR) 10 mg Oral Tablet Take 1 Tablet (10 mg total) by mouth Every evening   Yes  Provider, Historical   nitroGLYCERIN (NITROSTAT) 0.4 mg Sublingual Tablet, Sublingual Place 1 Tablet (0.4 mg total) under the tongue Every  5 minutes as needed for Chest pain for up to 3 doses for 3 doses over 15 minutes 10/25/21  Yes Bretta Bang, DO   omeprazole (PRILOSEC) 40 mg Oral Capsule, Delayed Release(E.C.) Take 1 Capsule (40 mg total) by mouth Once a day   Yes Provider, Historical   ondansetron (ZOFRAN ODT) 4 mg Oral Tablet, Rapid Dissolve Take 1 Tablet (4 mg total) by mouth Every 8 hours as needed for Nausea/Vomiting 11/18/21  Yes Clevinger, Heath E, NP-C   pantoprazole (PROTONIX) 40 mg Oral Tablet, Delayed Release (E.C.) Take 1 Tablet (40 mg total) by mouth Once a day   Yes Provider, Historical   rosuvastatin (CRESTOR) 40 mg Oral Tablet Take 1 Tablet (40 mg total) by mouth Every evening Take one tablet every day   Yes Provider, Historical   traMADoL (ULTRAM) 50 mg Oral Tablet Take 1 Tablet (50 mg total) by mouth Every 6 hours as needed for Pain 11/09/21  Yes Ammie Ferrier, MD   calcium acetate,phosphat bind, (PHOSLO) 667 mg Oral Capsule Take 2 Capsules (1,334 mg total) by mouth Three times daily with meals for 30 days 10/21/21 01/03/22  Carron Brazen, DO   ciprofloxacin HCl (CILOXIN) 0.3 % Ophthalmic Drops Administer 1 Drop into the left eye Every 6 hours for 5 days 12/09/21 12/16/21  Eustace Quail, MD   epoetin alfa-epbx (RETACRIT) 10,000 unit/mL Injection Solution Infuse 1 mL (10,000 Units total) into a venous catheter Give in Dialysis for 30 days 10/21/21 01/03/22  Carron Brazen, DO   isosorbide mononitrate (IMDUR) 60 mg Oral Tablet Sustained Release 24 hr Take 1 Tablet (60 mg total) by mouth Every morning  01/06/22  Provider, Historical   labetaloL (NORMODYNE) 300 mg Oral Tablet Take 1 Tablet (300 mg total) by mouth Twice daily 10/03/21 01/01/22  Sherol Dade, PA-C          Current medications   acetaminophen (TYLENOL) tablet, 650 mg, Oral, Q4H PRN  amLODIPine (NORVASC) tablet, 10 mg, Oral, Daily  budesonide-formoterol (SYMBICORT) 160 mcg-4.5 mcg per inhalation oral inhaler - "Respiratory to administer", 2 Puff, Inhalation,  2x/day  [START ON 01/08/2022] calcitriol (ROCALTROL) capsule, 1 mcg, Oral, M, W, and F  calcium acetate (PHOSLO) capsule, 1,334 mg, Oral, 3x/day-Meals  cinacalcet (SENSIPAR) tablet, 30 mg, Oral, QPM  cloNIDine (CATAPRES) tablet, 0.1 mg, Oral, Now  cloNIDine (CATAPRES) tablet, 0.2 mg, Oral, 2x/day  clopidogrel (PLAVIX) 75 mg tablet, 75 mg, Oral, Daily  cyclobenzaprine (FLEXERIL) tablet, 5 mg, Oral, 2x/day PRN  doxazosin (CARDURA) tablet, 4 mg, Oral, QPM  famotidine (PEPCID) tablet, 40 mg, Oral, Daily  furosemide (LASIX) tablet, 160 mg, Oral, Daily  hydrALAZINE (APRESOLINE) tablet, 100 mg, Oral, Q8HRS  ipratropium-albuterol 0.5 mg-3 mg(2.5 mg base)/3 mL Solution for Nebulization, 3 mL, Nebulization, 4x/day PRN  [START ON 01/07/2022] isosorbide mononitrate (IMDUR) 24 hr extended release tablet, 120 mg, Oral, QAM  labetalol (NORMODYNE) tablet, 400 mg, Oral, 3x/day  losartan (COZAAR) tablet, 100 mg, Oral, Daily  montelukast (SINGULAIR) 10 mg tablet, 10 mg, Oral, QPM  ondansetron (ZOFRAN ODT) rapid dissolve tablet, 4 mg, Oral, Q8H PRN  pantoprazole (PROTONIX) delayed release tablet, 40 mg, Oral, Daily  traMADol (ULTRAM) tablet, 50 mg, Oral, Q6H PRN            Physical:  Filed Vitals:    01/06/22 1445 01/06/22 1515 01/06/22 1545 01/06/22 1600   BP: Marland Kitchen)  142/99 (!) 160/106 (!) 138/93 (!) 140/103   Pulse: 83 76 71 71   Resp:  16 15 15    Temp:       SpO2: 96% 97%  98%      No intake or output data in the 24 hours ending 01/06/22 1916     Patient is alert awake and oriented not in acute distress.  Normal mood and affect.  HEENT normocephalic atraumatic.  Eye exam normal inspection.    Mucous membranes moist, no jaundice.  Neck exam no JVD normal inspection.  Cardiovascular system: Regular rate and rhythm no murmurs rubs or gallops. No chest wall tenderness  Lungs:  Breath sounds diminished bilaterally.  Positive wheezing  Abdomen soft nontender nondistended.  Extremities no clubbing cyanosis or edema   Neuro exam: EOMI, normal  speech    Labs:  CBC:     10.7 (11/13 1221) \   9.7* (11/13 1221) /   333 (11/13 1221)      / 30.1* (11/13 1221) \          BMP:   133* (11/13 1220) 93* (11/13 1220) 66* (11/13 1220)    /     100 (11/13 1220)   4.5 (11/13 1220) 24 (11/13 1220) 12.74* (11/13 1220) \               Diagnostic studies:  Imaging:   CT ANGIO CHEST FOR PULMONARY EMBOLUS W IV CONTRAST  Narrative: Taja Duerst    RADIOLOGIST: Tempie Donning    CT ANGIO CHEST FOR PULMONARY EMBOLUS W IV CONTRAST performed on 01/06/2022 5:06 PM    CLINICAL HISTORY: CHEST PAIN AND DYSPNEA ON EXERTION, ELEVATED D-DIMER.  CHEST PAIN AND DYSPNEA ON EXERTION, ELEVATED D-DIMER    TECHNIQUE: CTA imaging of the chest with intravenous contrast.  3D reconstructions.  CONTRAST:  100 ml's of Omnipaque 350    COMPARISON: 08/26/2021  # of known CTs in the past 12 months:  0   # of known Cardiac Nuclear Medicine Studies in the past 12 months:  0         FINDINGS:  Hardware:  None.    Lymph nodes:   No mediastinal, hilar, or axillary lymphadenopathy.    Heart:  Coronary artery calcifications are noted. There is likely left ventricular hypertrophy. Mitral valve calcifications.       RV/LV Diameter Ratio: N/A    Thoracic Aorta:  Contrast timing is designed to evaluate the pulmonary arteries.  The thoracic aorta has a normal contour.  Contrast opacification of the aorta is suboptimal for evaluation of the lumen. There are atherosclerotic calcifications of the aorta.    Pulmonary Vessels:  No evidence of acute pulmonary emboli through the major subsegmental branches.    Lungs and Airways:  There are lower lung predominant groundglass opacities. There is interlobular septal thickening. Findings could represent a component of volume overload. There are scattered areas of linear atelectasis.    Pleura: No pleural effusion.  No pneumothorax.    Upper Abdomen: Visualized portions of the upper abdominal viscera are unremarkable.    Bones: Bone windows are unremarkable.  Impression:  There are findings throughout the lungs which could represent volume overload as above.    Extensive coronary artery calcifications.    One or more dose reduction techniques were used (e.g., Automated exposure control, adjustment of the mA and/or kV according to patient size, use of iterative reconstruction technique).    Radiologist location ID: [id number]  XR AP MOBILE CHEST  Narrative: Deziah Buice    PROCEDURE DESCRIPTION: XR PORTABLE CHEST X-RAY    CLINICAL HISTORY:FEVER    COMPARISON:01/06/2022 obtained at 1242 hours        FINDINGS:  A single view of the chest was obtained at 1115 hours. Patchy infiltrates are identified in the mid the lower lungs bilaterally which appear more prominent than on the previous examination and may reflect acute pneumonia. The heart size is at the upper limits of normal. The tunneled hemodialysis catheter appears unchanged in position. A superior vena cava stent is again noted. A stent is also identified in the left upper extremity which likely lies in the left axillary vein. No pleural effusion or pneumothorax is seen.      Impression: Patchy infiltrates are identified in the mid the lower lungs bilaterally which appear somewhat worse than on the previous study and may reflect acute pneumonia.    Radiologist location ID: [id number]  ECG 12 LEAD  Sinus rhythm with 1st degree AV block  Prolonged QT  Abnormal ECG  When compared with ECG of 01-Jan-2022 13:26,  T wave inversion no longer evident in Lateral leads  Confirmed by Gloriann Loan, Kristen ([id number]) on 01/06/2022 9:19:07 AM  XR AP MOBILE CHEST  Narrative: Deirdra Correll    RADIOLOGIST: Dara Lords, MD    XR AP MOBILE CHEST performed on 01/06/2022 12:50 AM    CLINICAL HISTORY: Chest Pain.  CHEST PAIN, DIALYSIS PT    TECHNIQUE: Frontal view of the chest.    COMPARISON:  Yesterday    FINDINGS:  Central catheter tip is in the superior vena cava.   Cardiac and mediastinal contours are stable.   There is diffuse increased  pulmonary markings similar to the prior study. Increased density in the right lung base may signify developing infiltrate.   Impression: Developing right basilar infiltrate.    Radiologist location ID: [id number]      Assessments:  Active Hospital Problems   (*Primary Problem)    Diagnosis    *Dyspnea       Plan:         End-stage renal disease on hemodialysis  -Continue dialysis 3 times a week on schedule MWF  -we will arrange for extra session tomorrow  -please refer to dialysis orders    Anemia  -Monitor  -Epogen if HB less than 11    CKD MBD  -Phosphorus, Ca,   -aim for Phos level 3.5-5.5  -Renal diet    Acid-base  -Stable  -Continue dialysis    Electrolytes  -Monitor  -See dialysis orders  -Replace as needed    Volume status  -volume overload/shortness of breath proceed with dialysis  -Fluid restriction  -Dialysis    Diet  -Renal diet            MY ORDERS LAST 24 (24h ago, onward)       Start     Ordered    01/06/22 1430  HEMODIALYSIS  ONE TIME         01/06/22 1431    01/06/22 1430  [facility] PATIENT PRE AND POST DIALYSIS TREATMENT  EVERY OTHER DAY         01/06/22 1431                                Beather Arbour, MD, FASN, 01/06/2022, 19:16

## 2022-01-06 NOTE — ED Provider Notes (Signed)
[facility]  ED Primary Provider Note  Patient Name: [name]  Patient Age: 46 y.o.  Date of Birth: 09-Apr-1975    Chief Complaint: Chest Pain         History of Present Illness       [name] is a 46 y.o. female who had concerns including Chest Pain .  PATIENT PRESENTED TO THE EMERGENCY DEPARTMENT WITH COMPLAINTS OF CHEST PAIN, SHORTNESS FOR BREATH.  PATIENT STATES THAT THESE SYMPTOMS HAVE BEEN ONGOING FOR A LONG TIME, BUT HAVE CONTINUED TO WORSEN.  SHE STATES THAT SHE IS HAVING WORSENING SHORTNESS A BREATH AND PAIN ON EXERTION.  SHE STATES THAT SHE CAN NOT SEEM TO DO ANYTHING ANYMORE WITHOUT HAVING SIGNIFICANT CHEST PAIN.  SHE STATES THAT SHE WAS TAKEN OFF OF HER ELIQUIS 2 MONTHS AGO.  SHE DOES ADMIT TO HISTORY OF BLOOD CLOTS.  PATIENT ALSO ADMITS TO HISTORY OF KIDNEY INSUFFICIENCY.  PATIENT TOOK ASPIRIN AND 3 DOSES OF NITROGLYCERIN TODAY WITHOUT IMPROVEMENT IN SYMPTOMS.  SHE DENIES ANY SWELLING OF THE LOWER EXTREMITIES, BUT DOES ADMIT TO A HISTORY OF CONGESTIVE HEART FAILURE.  PATIENT ALSO STATES THAT SHE WEARS 3 L OF OXYGEN SUPPLEMENTATION VIA NASAL CANNULA AT HOME.  PATIENT HAS A HISTORY MI, CAD, CHF, PULMONARY EDEMA, ASTHMA, SLEEP APNEA, RENAL INSUFFICIENCY.  NOTHING SEEMS TO MAKE HER SYMPTOMS BETTER.  PATIENT DENIES ANY FURTHER COMPLAINTS AT TIME OF EXAMINATION.        Review of Systems     No other overt Review of Systems are noted to be positive except noted in the HPI.      Historical Data   History Reviewed This Encounter: Medical History  Surgical History  Family History  Social History      Physical Exam   ED Triage Vitals [01/06/22 1047]   BP (Non-Invasive) (!) 211/132   Heart Rate 77   Respiratory Rate 18   Temperature (!) 35.9 C (96.7 F)   SpO2 98 %   Weight 86.6 kg (191 lb)   Height 1.702 m (_0 )         Nursing notes reviewed for what could be assessed. Past Medical, Surgical, and Social history reviewed for what has been completed.      Constitutional: NAD. 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  Head: Normocephalic, atraumatic.  Mouth/Throat: no nasal discharge, posterior pharynx WNL  Eyes: EOM grossly intact, conjunctiva normal.  Neck: Supple  Cardiovascular: Regular Rate and Rhythm, extremities well perfused.  Pulmonary/Chest: No respiratory distress.  SLIGHTLY DECREASED BREATH SOUNDS THROUGHOUT.  Abdominal: Soft, non-tender, non-distended. Non peritoneal, no rebound, no guarding.  MSK: No Lower Extremity Edema.  Skin: Warm, dry, and intact  Neuro: Appropriate, CN II-XII grossly intact   Psych: Cooperative           Procedures      Patient Data     Labs Ordered/Reviewed   TROPONIN-I - Abnormal; Notable for the following components:       Result Value    TROPONIN I 179 (*)     All other components within normal limits   PTT (PARTIAL THROMBOPLASTIN TIME) - Abnormal; Notable for the following components:    APTT 69.9 (*)     All other components within normal limits   B-TYPE NATRIURETIC PEPTIDE - Abnormal; Notable for the following components:    BNP 1,498 (*)     All other components within normal limits    Narrative:  Class 1: 101-250 pg/mL                              Class 2: 251-550 pg/mL                              Class 3: 551-900 pg/mL                              Class 4: >901 pg/mL     The [place] [employer] has developed a four-stage functional classification system for CHF that is based on a subjective interpretation of the severity of a patient's clinical signs and symptoms.    Class 1 - Patients have no limitations on physical activity and have no symptoms with ordinary physical activity.    Class 2 - Patients have a slight limitation of physical activity and have symptoms with ordinary physical activity.    Class 3 - Patients have a marked limitation of physical activity and have symptoms with less than ordinary physical activity, but not at rest.    Class 4 - Patients are unable to perform any  physical activity without discomfort.   COMPREHENSIVE METABOLIC PANEL, NON-FASTING - Abnormal; Notable for the following components:    SODIUM 133 (*)     CHLORIDE 93 (*)     ANION GAP 16 (*)     BUN 66 (*)     CREATININE 12.74 (*)     BUN/CREA RATIO 5 (*)     ESTIMATED GFR 3 (*)     All other components within normal limits    Narrative:     Estimated Glomerular Filtration Rate (eGFR) is calculated using the CKD-EPI (2021) equation, intended for patients 57 years of age and older. If gender is not documented or "unknown", there will be no eGFR calculation.   C-REACTIVE PROTEIN (CRP) - Abnormal; Notable for the following components:    C-REACTIVE PROTEIN (CRP) 2.6 (*)     All other components within normal limits   CBC WITH DIFF - Abnormal; Notable for the following components:    RBC 3.37 (*)     HGB 9.7 (*)     HCT 30.1 (*)     MCHC 32.2 (*)     RDW 21.1 (*)     MPV 6.3 (*)     NEUTROPHIL % 86 (*)     LYMPHOCYTE % 6 (*)     NEUTROPHIL # 9.20 (*)     LYMPHOCYTE # 0.60 (*)     All other components within normal limits   D-DIMER - Abnormal; Notable for the following components:    D-DIMER 2,268 (*)     All other components within normal limits    Narrative:     D-Dimers are reported in FEU per ng/mL.    IF PATIENT IS EXHIBITING SYMPTOMS DVT/PE, THIS D-DIMER RESULT MAY INDICATE A NEED FOR FURTHER TESTING FOR THESE CONDITIONS. IF PATIENT IS SUSPECTED OF DIC AND SYMPTOMS WORSEN OR PERSIST, A REPEAT DIC WORKUP SHOULD BE CONSIDERED.    NOTE: ALTHOUGH THE NORMAL RANGE FOR THIS TEST IS 215-500 ng/mL FEU, LITERATURE RECOMMENDS FURTHER TESTING FOR ANY RESULT >500ng/mL FEU.    A cut off value of 500ng/mL FEU or below can be used as an aid in the diagnosis of Thromboembolism when used in conjunction with the patient's  medical history, clinical presentation and other findings. Results of 500ng/mL FEU or below have a negative predictive value of 100%.     THYROID STIMULATING HORMONE WITH FREE T4 REFLEX - Normal   LACTIC ACID  LEVEL W/ REFLEX FOR LEVEL >2.0 - Normal   LIPASE - Normal   MAGNESIUM - Normal   PT/INR - Normal    Narrative:     INR OF 2.0-3.0  RECOMMENDED FOR: PROPHYLAXIS/TREATMENT OF VENEOUS THROMBOSIS, PULMONARY EMBOLISM, PREVENTION OF SYSTEMIC EMBOLISM FROM ATRIAL FIBRILATION, MYOCARDIAL INFARCTION.    INR OF 2.5-3.5  RECOMMENDED FOR MECHANICAL PROSTHETIC HEART VALVES, RECURRENT SYSTEMIC EMBOLISM, RECURRENT MYOCARDIAL INFARCTION.     COVID-19, FLU A/B, RSV RAPID BY PCR - Normal    Narrative:     Results are for the simultaneous qualitative identification of SARS-CoV-2 (formerly 2019-nCoV), Influenza A, Influenza B, and RSV RNA. These etiologic agents are generally detectable in nasopharyngeal and nasal swabs during the ACUTE PHASE of infection. Hence, this test is intended to be performed on respiratory specimens collected from individuals with signs and symptoms of upper respiratory tract infection who meet Centers for Disease Control and Prevention (CDC) clinical and/or epidemiological criteria for Coronavirus Disease 2019 (COVID-19) testing. CDC COVID-19 criteria for testing on human specimens is available at [facility] webpage information for Healthcare Professionals: Coronavirus Disease 2019 (COVID-19) ([email or web address]).     False-negative results may occur if the virus has genomic mutations, insertions, deletions, or rearrangements or if performed very early in the course of illness. Otherwise, negative results indicate virus specific RNA targets are not detected, however negative results do not preclude SARS-CoV-2 infection/COVID-19, Influenza, or Respiratory syncytial virus infection. Results should not be used as the sole basis for patient management decisions. Negative results must be combined with clinical observations, patient history, and epidemiological information. If upper respiratory tract infection is still suspected based on exposure history together with other clinical  findings, re-testing should be considered.    Disclaimer:   This assay has been authorized by FDA under an Emergency Use Authorization for use in laboratories certified under the Clinical Laboratory Improvement Amendments of 1988 (CLIA), 42 U.S.C. 6107449021, to perform high complexity tests. The impacts of vaccines, antiviral therapeutics, antibiotics, chemotherapeutic or immunosuppressant drugs have not been evaluated.     Test methodology:   Cepheid Xpert Xpress SARS-CoV-2/Flu/RSV Assay real-time polymerase chain reaction (RT-PCR) test on the GeneXpert Dx and Xpert Xpress systems.   POC BLOOD GLUCOSE (RESULTS) - Normal   ADULT ROUTINE BLOOD CULTURE, SET OF 2 BOTTLES (BACTERIA AND YEAST)   ADULT ROUTINE BLOOD CULTURE, SET OF 2 BOTTLES (BACTERIA AND YEAST)   CBC/DIFF    Narrative:     The following orders were created for panel order CBC/DIFF.  Procedure                               Abnormality         Status                     ---------                               -----------         ------                     CBC WITH TWSF[[id number]]  Abnormal            Final result                 Please view results for these tests on the individual orders.   TROPONIN-I   TROPONIN-I   PERFORM POC WHOLE BLOOD GLUCOSE       XR AP MOBILE CHEST   Final Result by Edi, Radresults In (11/13 1130)   Patchy infiltrates are identified in the mid the lower lungs bilaterally which appear somewhat worse than on the previous study and may reflect acute pneumonia.         Radiologist location ID: [id number]             Medical Decision Making          Medical Decision Making        Studies Assessed:  LAB WORK, IMAGING, EKG    EKG:   This EKG interpreted by me shows:    Rate:  78    Interpretation:  NORMAL AXIS, SINUS RHYTHM, RATE 78, NONSPECIFIC ST-T WAVE CHANGES      MDM Narrative:  PATIENT PRESENTED TO THE EMERGENCY DEPARTMENT WITH COMPLAINTS OF CHEST PAIN AND SHORTNESS A BREATH THAT WORSENED ON EXERTION.  PATIENT STATES  THAT THIS HAS BEEN ONGOING FOR SOME TIME, BUT HAS CONTINUED TO WORSENED.  SHE STATES THAT SHE CAN NOT DO ANYTHING ANYMORE WITHOUT HAVING SEVERE CHEST PAIN SHORTNESS FOR BREATH.  SHE WAS TAKEN OFF OF ELIQUIS 2 MONTHS AGO AND DOES HAVE A HISTORY OF BLOOD CLOTS.  PATIENT IS STILL TAKING ASPIRIN AND PLAVIX.  PATIENT HAD FULL-DOSE ASPIRIN, AS WELL AS 3 DOSES OF NITROGLYCERIN EARLIER TODAY WITHOUT IMPROVEMENT IN SYMPTOMS.  PAIN IS LOCATED IN THE CENTER OF HER CHEST WITH SOME RADIATION TO THE BACK.  SHE ADMITS TO A CHRONIC COUGH THAT IS NONPRODUCTIVE AND UNCHANGED.  SHE DENIES ANY RECENT ASSOCIATED FEVER OR CHILLS, NAUSEA, VOMITING OR RECENT CHANGES IN BOWEL HABITS.  PATIENT DOES HAVE A HISTORY OF KIDNEY INSUFFICIENCY AND STATES THAT SHE TAKES 80 MG OF LASIX IN THE MORNING AND IN THE EVENING.  HOWEVER, SHE STATES THAT SHE ONLY URINATES MAYBE TWICE DAILY.  SHE DOES FOLLOW WITH NEPHROLOGY ON AN OUTPATIENT BASIS.  PATIENT ADMITS TO HISTORY OF CONGESTIVE HEART FAILURE AND STATES THAT SHE IS CURRENTLY BEING TREATED FOR PNEUMONIA, DESPITE HER PULMONOLOGIST TELLING HER THAT IT WAS NOT PNEUMONIA, IT WAS JUST FLUID ON THE X-RAY.  AT TIME OF EXAMINATION, PATIENT APPEARS IN NO APPARENT DISTRESS.  THERE ARE SLIGHTLY DECREASED BREATH SOUNDS THROUGHOUT.  PATIENT HAS NO SIGNIFICANT SWELLING OF THE LOWER EXTREMITIES.  PATIENT IS IN NO APPARENT RESPIRATORY DISTRESS.  OXYGEN SUPPLEMENTATION HAS BEEN PROVIDED AT 2 L VIA NASAL CANNULA AND OXYGEN SATURATION IS 96% AT TIME OF EXAMINATION.  LAB WORK AND IMAGING WERE ORDERED.  PATIENT WAS STABLE.  CLONIDINE WAS ORDERED FOR HYPERTENSION.      ED Course as of 01/06/22 1422   Mon Jan 06, 2022   1218 POINT OF CARE GLUCOSE 96   1219 COVID-19, RSV, INFLUENZA TESTING WAS NEGATIVE   1219 CHEST X-RAY REVEALED:   Patchy infiltrates are identified in the mid the lower lungs bilaterally which appear somewhat worse than on the previous study and may reflect acute pneumonia.   1307 WBC 10.7, HEMOGLOBIN 9.7,  PLATELET COUNT 333, SODIUM 133, POTASSIUM 4.5, BUN 66, CREATININE 12.74, TOTAL BILIRUBIN 0.7, AST 14, ALT 7, TOTAL ALKALINE PHOSPHATASE 84, GLUCOSE 100   1308 MAGNESIUM 2.0  1308 MAGNESIUM 2.0, CRP 2.6, LIPASE 48, PT 12.7, INR 1.10, PTT 69.9, TROPONIN 179, LACTIC ACID 0.5, D-DIMER 2268.  VENTILATION/PERFUSION SCAN HAS BEEN ORDERED   1308 TSH 1.500   1309 CLONIDINE WAS ORDERED FOR HYPERTENSION, BUT IT WAS DISCOVERED THE PATIENT HAD NOT TAKEN ANY OF HER DAILY HOME MEDICATIONS, INCLUDING CLONIDINE AND OTHER BLOOD PRESSURE MEDICINES.  PATIENT WAS INSTRUCTED TO TAKE HER MEDICINE, AS PRESCRIBED.   1310 BNP 1498   1310 NEPHROLOGIST WAS PAGED   1335 I WAS ABLE TO SPEAK WITH THE NEPHROLOGIST, WHO STATES THAT HE WILL PRESENTED TO THE EMERGENCY DEPARTMENT TO EVALUATE THE PATIENT AND TAKE FOR EMERGENT DIALYSIS.  HE STATES THAT THE PATIENT CAN RECEIVE CTA OF THE CHEST TO RULE OUT PULMONARY EMBOLISM PRIOR TO DIALYSIS INSTEAD OF WAITING UNTIL LATER THIS EVENING BEFORE SHE IS ABLE TO RECEIVE A VENTILATION/PERFUSION SCAN.  HOSPITALIST WAS PAGED FOR ADMISSION.   65 BLOOD PRESSURE IS IMPROVING AFTER TAKING HOME MEDICATIONS   1408 HOSPITALIST PAGED FOR ADMISSION     PATIENT ACCEPTED FOR ADMISSION UNDER HOSPITALIST SERVICE.  NEPHROLOGIST HAS BEEN CONSULTED AND WILL EVALUATE THE PATIENT IN THE EMERGENCY DEPARTMENT PRIOR TO DIALYSIS.  CTA OF THE CHEST HAS BEEN ORDERED TO RULE OUT PULMONARY EMBOLISM BEFORE SHE GOES TO DIALYSIS.  REPEAT TROPONINS HAVE BEEN ORDERED.  PATIENT IN NO APPARENT DISTRESS AT TIME OF ADMISSION WITH IMPROVEMENT IN VITAL SIGNS AFTER TAKING HOME MEDICATION.    Medications Administered in the ED   cloNIDine (CATAPRES) tablet (0 mg Oral Not Given 01/06/22 1300)       Patient will be admitted to the  service for further workup and management.    Disposition: Admitted             Clinical Impression   Chest pain, unspecified type (Primary)   End-stage renal disease (ESRD) (CMS HCC)   Elevated troponin   Elevated d-dimer    Congestive heart failure, unspecified HF chronicity, unspecified heart failure type (CMS [facility])         Current Discharge Medication List            Thera Flake, DO

## 2022-01-06 NOTE — Care Plan (Signed)
Problem: Adult Inpatient Plan of Care  Goal: Plan of Care Review  01/06/2022 2147 by Shannan Harper, RN  Outcome: Ongoing (see interventions/notes)  01/06/2022 2146 by Shannan Harper, RN  Outcome: Ongoing (see interventions/notes)  Goal: Patient-Specific Goal (Individualized)  01/06/2022 2147 by Shannan Harper, RN  Outcome: Ongoing (see interventions/notes)  01/06/2022 2146 by Shannan Harper, RN  Outcome: Ongoing (see interventions/notes)  Goal: Absence of Hospital-Acquired Illness or Injury  01/06/2022 2147 by Shannan Harper, RN  Outcome: Ongoing (see interventions/notes)  01/06/2022 2146 by Shannan Harper, RN  Outcome: Ongoing (see interventions/notes)  Goal: Optimal Comfort and Wellbeing  01/06/2022 2147 by Shannan Harper, RN  Outcome: Ongoing (see interventions/notes)  01/06/2022 2146 by Shannan Harper, RN  Outcome: Ongoing (see interventions/notes)  Goal: Rounds/Family Conference  01/06/2022 2147 by Shannan Harper, RN  Outcome: Ongoing (see interventions/notes)  01/06/2022 2146 by Shannan Harper, RN  Outcome: Ongoing (see interventions/notes)

## 2022-01-06 NOTE — ED Provider Notes (Signed)
[facility], [facility] Emergency Department  ED Primary Provider Note  HPI:  [name] is a 46 y.o. female   Patient complains of chest and back pain.  It has been ongoing for about a week. Intermittent states it is about an 8/10 there is some radiation down the left arm.  It feels sharp without inciting exacerbating or alleviating factors.  She is not short of breath she is not nauseated she is not vomited.  Review of records including discharge summary 10/23 indicates patient was recently admitted for possible pneumonia and mild fluid overload.  She was treated with antibiotics and discharged on doxycycline.  She is not fill the prescription..  Patient is ESRD Monday Wednesday Friday has not missed dialysis.  She also has severe triple-vessel coronary disease and had cardiac catheterization August 10th which showed diffuse triple-vessel coronary artery disease.  She is was referred to [facility] for possible bypass surgery but states that she was deemed not a candidate. The last echocardiogram showed normal left ventricle systolic function with ejection fraction 60 65%.  She states he is compliant with medications which include Imdur.  COVID vaccinated.  Full code.    ROS review and negative aside from stated in HPI.    Physical Exam:  ED Triage Vitals   BP (Non-Invasive) 01/06/22 0021 (!) 156/100   Heart Rate 01/06/22 0021 83   Respiratory Rate 01/06/22 0021 (!) 22   Temperature 01/06/22 0017 36.9 C (98.4 F)   SpO2 01/06/22 0021 99 %   Weight 01/06/22 0017 86.6 kg (191 lb)   Height 01/06/22 0017 1.702 m (_0 )     No acute distress.  She appears chronically ill.  Patient awake alert oriented x3.  Mood is appropriate.  Pupils 3 mm equal round reactive. Extraocular movements are intact.  Oropharynx is clear.  Mucous membranes moist.  Trachea midline.  Neck is supple.  Heart has regular rate and rhythm.  Lungs are clear to auscultation.  Abdomen soft nontender, nondistended.   Moving all extremities without difficulty.  No rash no edema.  Left subclavian dialysis catheter site is clean and dry. There is no fluctuance there is no tenderness.  Skin overall is dry.    Patient data:  Labs Ordered/Reviewed   COMPREHENSIVE METABOLIC PANEL, NON-FASTING - Abnormal; Notable for the following components:       Result Value    SODIUM 134 (*)     CHLORIDE 93 (*)     ANION GAP 16 (*)     BUN 61 (*)     CREATININE 12.50 (*)     ESTIMATED GFR 3 (*)     All other components within normal limits    Narrative:     Estimated Glomerular Filtration Rate (eGFR) is calculated using the CKD-EPI (2021) equation, intended for patients 8 years of age and older. If gender is not documented or "unknown", there will be no eGFR calculation.   TROPONIN-I - Abnormal; Notable for the following components:    TROPONIN I 216 (*)     All other components within normal limits    Narrative:     Values received on females ranging between 12-15 ng/L MUST include the next serial troponin to review changes in the delta differences as the reference range for the Access II chemistry analyzer is lower than the established reference range.     TROPONIN-I - Abnormal; Notable for the following components:    TROPONIN I 224 (*)  All other components within normal limits    Narrative:     Values received on females ranging between 12-15 ng/L MUST include the next serial troponin to review changes in the delta differences as the reference range for the Access II chemistry analyzer is lower than the established reference range.     CBC WITH DIFF - Abnormal; Notable for the following components:    RBC 3.29 (*)     HGB 9.6 (*)     HCT 30.2 (*)     MCHC 31.6 (*)     RDW 21.2 (*)     NEUTROPHIL % 82 (*)     LYMPHOCYTE % 8 (*)     LYMPHOCYTE # 0.76 (*)     All other components within normal limits   CBC/DIFF    Narrative:     The following orders were created for panel order CBC/DIFF.  Procedure                               Abnormality          Status                     ---------                               -----------         ------                     CBC WITH QMGQ[[id number]]                Abnormal            Final result                 Please view results for these tests on the individual orders.   TROPONIN-I     XR AP MOBILE CHEST   Final Result by Edi, Radresults In (11/13 0056)   Developing right basilar infiltrate.         Radiologist location ID: [id number]           EKG read by me shows a sinus rhythm with a rate of 78, normal axis, QTC is 490.  Do not appreciate significant ST or T-wave who.  No significant changes compared to prior EKG     MDM:    Chest pain.  Broad DX includes chronic angina versus ACS.  Low suspicion for dissection will evaluate for improvement of pneumonia.  Worsening fluid overload also on differential.  Patient stable on home 2 L oxygen.  EKG unchanged from prior nonspecific serious.  Serial troponin to 216 and 224 which did not significantly elevated from prior and consistent with ESRD.  Remainder of lab work unremarkable.  Chest x-ray showed possible developing right basilar infiltrate per Radiology.  I did review these findings with the patient and answered her questions.  Will start on doxycycline as previously planned upon recent discharge I do suspect her chest x-ray findings are due to fluid overload.  She is due for dialysis.  Concerning patient's chest pain.  Pattern is consistent with chronic stable angina.  She is essentially maxed out on therapy with Imdur and is not a candidate for further intervention.  We discussed the possible benefit of adding on Ranexa however there is little data as to by availability and dosing  in the setting of ESRD.  Will defer to patient's nephrologist especially as we are starting doxycycline which is QT prolonging.  Patient was given a dose nitro here lidocaine and fentanyl reported some improvement of symptoms.  She is comfortable with discharge at this time and will go  to dialysis this morning.        Discharged  Clinical Impression   Hypervolemia, unspecified hypervolemia type   CAP (community acquired pneumonia)   Chronic stable angina (Primary)     Medications Administered in the ED   aspirin chewable tablet 324 mg (0 mg Oral Not Given 01/06/22 0100)   lidocaine 4% patch (1 Patch Transdermal Patch Applied 01/06/22 0115)   nitroGLYCERIN (NITROSTAT) sublingual tablet (has no administration in time range)   fentaNYL (SUBLIMAZE) 50 mcg/mL injection (75 mcg Intravenous Given 01/06/22 0115)   doxycycline tablet (100 mg Oral Given 01/06/22 0304)        Current Discharge Medication List        CONTINUE these medications - NO CHANGES were made during your visit.        Details   acetaminophen 325 mg Tablet  Commonly known as: TYLENOL   650 mg, Oral, EVERY 4 HOURS PRN  Refills: 0     amLODIPine 10 mg Tablet  Commonly known as: NORVASC   10 mg, Oral, DAILY, Take one tablet every day  Refills: 0     budesonide-formoteroL 160-4.5 mcg/actuation oral inhaler  Commonly known as: SYMBICORT   2 Puffs, Inhalation, 2 TIMES DAILY  Refills: 0     calcitrioL 0.5 mcg Capsule  Commonly known as: ROCALTROL   1 mcg, Oral, EVERY MO, WE AND FR  Refills: 0     calcium acetate(phosphat bind) 667 mg Capsule  Commonly known as: PHOSLO   1,334 mg, Oral, 3 TIMES DAILY WITH MEALS  Qty: 180 Capsule  Refills: 0     cinacalcet 30 mg Tablet  Commonly known as: SENSIPAR   30 mg, Oral, EVERY EVENING  Refills: 0     cloNIDine 0.3 mg/24 hr Patch Weekly  Commonly known as: CATAPRES-TTS   0.3 mg, Transdermal, EVERY 7 DAYS  Refills: 0     cloNIDine HCL 0.2 mg Tablet  Commonly known as: CATAPRES   0.2 mg, Oral, 2 TIMES DAILY, Take one tablet twice a day  Refills: 0     clopidogreL 75 mg Tablet  Commonly known as: PLAVIX   75 mg, Oral, DAILY  Refills: 0     cyclobenzaprine 5 mg Tablet  Commonly known as: FLEXERIL   5 mg, Oral, 2 TIMES DAILY PRN  Refills: 0     doxazosin 4 mg Tablet  Commonly known as: CARDURA   4 mg, Oral,  EVERY EVENING  Refills: 0     doxycycline monohydrate 100 mg Capsule  Commonly known as: MONODOX   100 mg, Oral, 2 TIMES DAILY  Qty: 12 Capsule  Refills: 0     famotidine 40 mg Tablet  Commonly known as: PEPCID   40 mg, Oral, DAILY  Refills: 0     furosemide 80 mg Tablet  Commonly known as: LASIX   160 mg, Oral, DAILY, Take after dialysis on dialysis days; otherwise take in morning daily.   Refills: 0     hydrALAZINE 100 mg Tablet  Commonly known as: APRESOLINE   100 mg, Oral, EVERY 8 HOURS (SCHEDULED)  Qty: 90 Tablet  Refills: 0     ipratropium-albuteroL 0.5 mg-3 mg(2.5 mg base)/3 mL nebulizer  solution  Commonly known as: DUONEB   3 mL, Nebulization, 4 TIMES DAILY PRN  Refills: 0     * isosorbide mononitrate 60 mg Tablet Sustained Release 24 hr  Commonly known as: IMDUR   60 mg, Oral, EVERY MORNING  Refills: 0     * Isosorbide Mononitrate 120 mg Tablet Sustained Release 24 hr  Commonly known as: IMDUR   120 mg, Oral, EVERY MORNING  Qty: 30 Tablet  Refills: 0     labetaloL 200 mg Tablet  Commonly known as: NORMODYNE   400 mg, Oral, 3 TIMES DAILY  Refills: 0     losartan 100 mg Tablet  Commonly known as: COZAAR   100 mg, Oral, DAILY  Refills: 0     montelukast 10 mg Tablet  Commonly known as: SINGULAIR   10 mg, Oral, EVERY EVENING  Refills: 0     nitroGLYCERIN 0.4 mg Tablet, Sublingual  Commonly known as: NITROSTAT   0.4 mg, Sublingual, EVERY 5 MIN PRN, for 3 doses over 15 minutes  Qty: 3 Tablet  Refills: 0     omeprazole 40 mg Capsule, Delayed Release(E.C.)  Commonly known as: PRILOSEC   40 mg, Oral, DAILY  Refills: 0     ondansetron 4 mg Tablet, Rapid Dissolve  Commonly known as: ZOFRAN ODT   4 mg, Oral, EVERY 8 HOURS PRN  Qty: 12 Tablet  Refills: 0     pantoprazole 40 mg Tablet, Delayed Release (E.C.)  Commonly known as: PROTONIX   40 mg, Oral, DAILY  Refills: 0     rosuvastatin 40 mg Tablet  Commonly known as: CRESTOR   40 mg, Oral, EVERY EVENING, Take one tablet every day  Refills: 0     traMADoL 50 mg  Tablet  Commonly known as: ULTRAM   50 mg, Oral, EVERY 6 HOURS PRN  Qty: 20 Tablet  Refills: 0           * This list has 2 medication(s) that are the same as other medications prescribed for you. Read the directions carefully, and ask your doctor or other care provider to review them with you.

## 2022-01-06 NOTE — ED Nurses Note (Addendum)
Pt not able to given urine sample. Pt in ESRD and only urinates once or maybe twice a day. Pt urinated this morning at home before arrival. Provider aware.

## 2022-01-06 NOTE — ED Triage Notes (Signed)
Per EMS, patient c/o midsternal chest pain since about 2300 after waking. Was recently hospitalized and discharged on Friday. Patient told EMS that she took 2 NTG and 324 mg ASA prior to their arrival.

## 2022-01-06 NOTE — ED Nurses Note (Signed)
Pt has not taken home BP medication this morning. Provider notified. Pt to take home BP meds at this time.

## 2022-01-06 NOTE — Discharge Instructions (Addendum)
You were seen in the emergency department for chest pain.  Your evaluation did not reveal a critical condition requiring hospitalization.  Start doxycycline and take to completion for a total of 6 days.   Because you have chronic chest pain and you are on maximum therapy and not a candidate for bypass,  strongly consider RANOLAZINE.    There is very little data for dosing of this medication inpatient on dialysis see need to have an extensive discussion with your nephrologist cardiologist.  Return to emergency department for worsening chest pain shortness breath or any other concerning symptoms.  Thank you for visiting [facility]

## 2022-01-06 NOTE — ED Nurses Note (Signed)
Pt transported to dialysis at this time. Pt to return to room.

## 2022-01-06 NOTE — ED Nurses Note (Addendum)
Report called to 3W to [place] at this time. Pt in dialysis at this. Pt to be taken from dialysis to admitted room.

## 2022-01-06 NOTE — ED Triage Notes (Signed)
Chest pain for days after walking up 4 flights of steps, seen at [facility] ER last night. Hasn't taken home meds, supposed to have dialysis today but didn't go.   BWVRS: 3NTG, 324 ASA no relief, 20g LFA. O2 cont.

## 2022-01-06 NOTE — ED Nurses Note (Signed)
Pt up for discharge,pt waiting on ride to transport her home.

## 2022-01-06 NOTE — ED Nurses Note (Signed)
Discharge instructions given to pt, verbalized understanding and denies any questions or concerns. Pt sitting up at bedside waiting on ride for discharge home. Pt has own portable O2 tank at 2 lpm NC.

## 2022-01-07 DIAGNOSIS — Z992 Dependence on renal dialysis: Secondary | ICD-10-CM

## 2022-01-07 DIAGNOSIS — I132 Hypertensive heart and chronic kidney disease with heart failure and with stage 5 chronic kidney disease, or end stage renal disease: Secondary | ICD-10-CM

## 2022-01-07 DIAGNOSIS — D631 Anemia in chronic kidney disease: Secondary | ICD-10-CM

## 2022-01-07 DIAGNOSIS — N186 End stage renal disease: Secondary | ICD-10-CM

## 2022-01-07 DIAGNOSIS — I2510 Atherosclerotic heart disease of native coronary artery without angina pectoris: Secondary | ICD-10-CM

## 2022-01-07 DIAGNOSIS — I509 Heart failure, unspecified: Secondary | ICD-10-CM

## 2022-01-07 DIAGNOSIS — R0600 Dyspnea, unspecified: Secondary | ICD-10-CM

## 2022-01-07 DIAGNOSIS — E8770 Fluid overload, unspecified: Secondary | ICD-10-CM

## 2022-01-07 MED ORDER — ETHYL ALCOHOL 62 % (NOZIN NASAL SANITIZER) NASAL SOLUTION - BULK BOTTLE
1.0000 | Freq: Two times a day (BID) | NASAL | Status: DC
Start: 2022-01-07 — End: 2022-01-09
  Administered 2022-01-07 – 2022-01-08 (×4): 1 via NASAL

## 2022-01-07 NOTE — Progress Notes (Signed)
[facility]      Date of Service:  01/07/2022  [name], [name], 46 y.o. female  Encounter Start Date:  01/06/2022  Inpatient Admission Date:   Date of Birth:  1975/12/17  PCP: Cam Hai, FNP        Chief Complaint:  Shortness of breath    HPI: [name] is a 46 y.o., 85 American female who is well known to hospitalist service with multiple admissions including a recent 1 as of last week  She presents today with complaint of shortness a breath  Denies any chest neck arm back or jaw pain  Has chronic orthopnea and states she stays in the sitting position "essentially 24/7"  She denies any fever chills  Has a nonproductive cough  Good appetite, denies any abdominal pain nausea vomiting or diarrhea  Chronic dialysis patient M/W/F  ER has spoken with Dr. Liliane Shi who advised admission for dialysis today    Assessment/plan:  ** ACUTE ON CHRONIC DYSPNEA  ** ESRD, DIALYSIS M/W/F  ** HYPERTENSION  ** ANEMIA CHRONIC DISEASE  ** CAD    Admit to medical service  Nephrology consult as above  Home meds as appropriate  Likely discharge in a.m.    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    Tricuspid valve regurgitation     Uncontrolled hypertension         Past Surgical History:   Procedure Laterality Date    CORONARY ARTERY ANGIOPLASTY      ESOPHAGOGASTRODUODENOSCOPY      HX BACK SURGERY       HX CHOLECYSTECTOMY      HX FOOT SURGERY Right     HX HYSTERECTOMY      HX TONSILLECTOMY              Medications Prior to Admission       Prescriptions    acetaminophen (TYLENOL) 325 mg Oral Tablet    Take 2 Tablets (650 mg total) by mouth Every 4 hours as needed    amLODIPine (NORVASC) 10 mg Oral Tablet    Take 1 Tablet (10 mg total) by mouth Once a day Take one tablet every day    budesonide-formoteroL (SYMBICORT) 160-4.5 mcg/actuation Inhalation oral inhaler    Take 2 Puffs by inhalation Twice daily    calcitrioL (ROCALTROL) 0.5 mcg Oral Capsule    Take 2 Capsules (1 mcg total) by mouth Every Monday, Wednesday and Friday    calcium acetate,phosphat bind, (PHOSLO) 667 mg Oral Capsule    Take 2 Capsules (1,334 mg total)  by mouth Three times daily with meals for 30 days    cinacalcet (SENSIPAR) 30 mg Oral Tablet    Take 1 Tablet (30 mg total) by mouth Every evening    cloNIDine (CATAPRES-TTS) 0.3 mg/24 hr Transdermal Patch Weekly    Place 1 Patch (0.3 mg total) on the skin Every 7 days    cloNIDine HCL (CATAPRES) 0.2 mg Oral Tablet    Take 1 Tablet (0.2 mg total) by mouth Twice daily Take one tablet twice a day    clopidogreL (PLAVIX) 75 mg Oral Tablet    Take 1 Tablet (75 mg total) by mouth Once a day    cyclobenzaprine (FLEXERIL) 5 mg Oral Tablet    Take 1 Tablet (5 mg total) by mouth Twice per day as needed for Muscle spasms    doxazosin (CARDURA) 4 mg Oral Tablet    Take 1 Tablet (4 mg total) by mouth Every evening    doxycycline monohydrate (MONODOX) 100 mg Oral Capsule    Take 1 Capsule (100 mg total) by mouth Twice daily for 6 days    famotidine (PEPCID) 40 mg Oral Tablet    Take 1 Tablet (40 mg total) by mouth Once a day    furosemide (LASIX) 80 mg Oral Tablet    Take 2 Tablets (160 mg total) by mouth Once a day Take after dialysis on dialysis days; otherwise take in morning daily.    hydrALAZINE (APRESOLINE) 100 mg Oral Tablet    Take 1 Tablet (100 mg total) by mouth Every 8 hours for 30 days     ipratropium-albuterol 0.5 mg-3 mg(2.5 mg base)/3 mL Solution for Nebulization    Take 3 mL by nebulization Four times a day as needed    isosorbide mononitrate (IMDUR) 120 mg Oral Tablet Sustained Release 24 hr    Take 1 Tablet (120 mg total) by mouth Every morning for 30 days    labetaloL (NORMODYNE) 200 mg Oral Tablet    Take 2 Tablets (400 mg total) by mouth Three times a day    losartan (COZAAR) 100 mg Oral Tablet    Take 1 Tablet (100 mg total) by mouth Once a day    montelukast (SINGULAIR) 10 mg Oral Tablet    Take 1 Tablet (10 mg total) by mouth Every evening    nitroGLYCERIN (NITROSTAT) 0.4 mg Sublingual Tablet, Sublingual    Place 1 Tablet (0.4 mg total) under the tongue Every 5 minutes as needed for Chest pain for up to 3 doses for 3 doses over 15 minutes    omeprazole (PRILOSEC) 40 mg Oral Capsule, Delayed Release(E.C.)    Take 1 Capsule (40 mg total) by mouth Once a day    ondansetron (ZOFRAN ODT) 4 mg Oral Tablet, Rapid Dissolve    Take 1 Tablet (4 mg total) by mouth Every 8 hours as needed for Nausea/Vomiting    pantoprazole (PROTONIX) 40 mg Oral Tablet, Delayed Release (E.C.)    Take 1 Tablet (40 mg total) by mouth Once a day    rosuvastatin (CRESTOR) 40 mg Oral Tablet    Take 1 Tablet (40 mg total) by mouth Every evening Take one tablet every day    traMADoL (ULTRAM) 50 mg Oral Tablet    Take 1 Tablet (50 mg total) by mouth Every 6 hours as needed for Pain          Allergies   Allergen Reactions    Lisinopril Swelling     Tongue and throat swelling  Cardene [Nicardipine]  Other Adverse Reaction (Add comment)     Chest pain     Oxycodone Itching       Family History:  Family Medical History:       Problem Relation (Age of Onset)    Breast Cancer Mother    Coronary Artery Disease Mother    Diabetes type II Mother    Hypertension (High Blood Pressure) Father               Social History:  Social History     Tobacco Use    Smoking status: Every Day     Packs/day: 1.00     Years: 10.00     Additional  pack years: 0.00     Total pack years: 10.00     Types: Cigarettes     Passive exposure: Past    Smokeless tobacco: Former    Tobacco comments:     "Haven't had a cigerette in 2 weeks."   Vaping Use    Vaping Use: Never used   Substance Use Topics    Alcohol use: Not Currently    Drug use: Yes     Types: Marijuana        Review of Systems:  ROS as above otherwise noncontributing    Examination:  Temperature: 37.1 C (98.8 F) Heart Rate: 70 BP (Non-Invasive): (!) 138/91   Respiratory Rate: 18 SpO2: 97 %     Physical Exam   General well-developed well-nourished black female in no acute distress  HEENT normocephalic atraumatic membranes dry  Neck supple  Chest equal expansion  Lungs scattered rhonchi  Heart regular rate and rhythm  Abdomen soft nontender nondistended  Extremities negative for edema  Neuro alert oriented no gross deficits    Labs:    I have reviewed all lab results.  Lab Results Today:    Results for orders placed or performed during the hospital encounter of 01/06/22 (from the past 24 hour(s))   TROPONIN IN ONE HOUR   Result Value Ref Range    TROPONIN I 170 (H) <15 ng/L   TROPONIN IN THREE HOURS   Result Value Ref Range    TROPONIN I 180 (H) <15 ng/L       Imaging Studies:    XR AP MOBILE CHEST    Result Date: 01/06/2022  Impression Patchy infiltrates are identified in the mid the lower lungs bilaterally which appear somewhat worse than on the previous study and may reflect acute pneumonia. Radiologist location ID: [id number]     XR AP MOBILE CHEST    Result Date: 01/06/2022  Impression Developing right basilar infiltrate. Radiologist location ID: [id number]    CT ANGIO CHEST FOR PULMONARY EMBOLUS W IV CONTRAST   Final Result   There are findings throughout the lungs which could represent volume overload as above.      Extensive coronary artery calcifications.         One or more dose reduction techniques were used (e.g., Automated exposure control, adjustment of the mA and/or kV according to  patient size, use of iterative reconstruction technique).         Radiologist location ID: [id number]         XR AP MOBILE CHEST   Final Result   Patchy infiltrates are identified in the mid the lower lungs bilaterally which appear somewhat worse than on the previous study and may reflect acute pneumonia.         Radiologist location ID: [id number]  DNR Status:  Full Code    Assessment/Plan:   Active Hospital Problems    Diagnosis    Primary Problem: Dyspnea    Chest pain, unspecified type             DVT/PE Prophylaxis:     Felecia Jan MD

## 2022-01-07 NOTE — Consults (Signed)
[facility]   Nephrology Consult      Van, [place], 46 y.o. female  Encounter Start Date:  01/06/2022  Inpatient Admission Date:    Date of Birth:  [birth year]    Admitting Provider:  Hospitalist Service    Reason for Consult:  End-stage renal disease on hemodialysis with fluid overload    HPI:  46 year old female with past medical history of heart disease, ESRD on HD, hypertension presented to the hospital with shortness of breath, patient reported that she is not following fluid restrictions.  Patient's symptoms has been going since yesterday evening was started getting worse decided to come to hospital for further evaluation.  Patient was due for dialysis today as she gets dialysis Monday Wednesday and Friday she could not make it to dialysis.  Further workup indicated fluid overload chest pain and shortness of breath.   01-07-2022  Nephrology progress note on this patient with end-stage renal disease hemodialysis 3 times a week missed dialysis came in with fluid overload dialyzed yesterday and dialyze today 3 L ultrafiltration each session anemia of chronic kidney disease treatment per protocol continue dialysis as scheduled feels better less short of breath    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    Tricuspid valve regurgitation     Uncontrolled hypertension          Medications Prior to Admission       Prescriptions    acetaminophen (TYLENOL) 325 mg Oral Tablet    Take 2 Tablets (650 mg total) by mouth Every 4 hours as needed    amLODIPine (NORVASC) 10 mg Oral Tablet     Take 1 Tablet (10 mg total) by mouth Once a day Take one tablet every day    budesonide-formoteroL (SYMBICORT) 160-4.5 mcg/actuation Inhalation oral inhaler    Take 2 Puffs by inhalation Twice daily    calcitrioL (ROCALTROL) 0.5 mcg Oral Capsule    Take 2 Capsules (1 mcg total) by mouth Every Monday, Wednesday and Friday    calcium acetate,phosphat bind, (PHOSLO) 667 mg Oral Capsule    Take 2 Capsules (1,334 mg total) by mouth Three times daily with meals for 30 days    cinacalcet (SENSIPAR) 30 mg Oral Tablet    Take 1 Tablet (30 mg total) by mouth Every evening    cloNIDine (CATAPRES-TTS) 0.3 mg/24 hr Transdermal Patch Weekly    Place 1 Patch (0.3 mg total) on the skin Every 7 days    cloNIDine HCL (CATAPRES) 0.2 mg Oral Tablet    Take 1 Tablet (0.2 mg total) by mouth Twice daily Take one tablet twice a day    clopidogreL (PLAVIX) 75 mg Oral Tablet    Take 1 Tablet (75 mg total) by mouth Once a day    cyclobenzaprine (FLEXERIL) 5 mg Oral Tablet    Take 1 Tablet (5 mg total) by mouth Twice per day as needed for  Muscle spasms    doxazosin (CARDURA) 4 mg Oral Tablet    Take 1 Tablet (4 mg total) by mouth Every evening    doxycycline monohydrate (MONODOX) 100 mg Oral Capsule    Take 1 Capsule (100 mg total) by mouth Twice daily for 6 days    famotidine (PEPCID) 40 mg Oral Tablet    Take 1 Tablet (40 mg total) by mouth Once a day    furosemide (LASIX) 80 mg Oral Tablet    Take 2 Tablets (160 mg total) by mouth Once a day Take after dialysis on dialysis days; otherwise take in morning daily.    hydrALAZINE (APRESOLINE) 100 mg Oral Tablet    Take 1 Tablet (100 mg total) by mouth Every 8 hours for 30 days    ipratropium-albuterol 0.5 mg-3 mg(2.5 mg base)/3 mL Solution for Nebulization    Take 3 mL by nebulization Four times a day as needed    isosorbide mononitrate (IMDUR) 120 mg Oral Tablet Sustained Release 24 hr    Take 1 Tablet (120 mg total) by mouth Every morning for 30 days    labetaloL (NORMODYNE) 200 mg Oral  Tablet    Take 2 Tablets (400 mg total) by mouth Three times a day    losartan (COZAAR) 100 mg Oral Tablet    Take 1 Tablet (100 mg total) by mouth Once a day    montelukast (SINGULAIR) 10 mg Oral Tablet    Take 1 Tablet (10 mg total) by mouth Every evening    nitroGLYCERIN (NITROSTAT) 0.4 mg Sublingual Tablet, Sublingual    Place 1 Tablet (0.4 mg total) under the tongue Every 5 minutes as needed for Chest pain for up to 3 doses for 3 doses over 15 minutes    omeprazole (PRILOSEC) 40 mg Oral Capsule, Delayed Release(E.C.)    Take 1 Capsule (40 mg total) by mouth Once a day    ondansetron (ZOFRAN ODT) 4 mg Oral Tablet, Rapid Dissolve    Take 1 Tablet (4 mg total) by mouth Every 8 hours as needed for Nausea/Vomiting    pantoprazole (PROTONIX) 40 mg Oral Tablet, Delayed Release (E.C.)    Take 1 Tablet (40 mg total) by mouth Once a day    rosuvastatin (CRESTOR) 40 mg Oral Tablet    Take 1 Tablet (40 mg total) by mouth Every evening Take one tablet every day    traMADoL (ULTRAM) 50 mg Oral Tablet    Take 1 Tablet (50 mg total) by mouth Every 6 hours as needed for Pain          acetaminophen (TYLENOL) tablet, 650 mg, Oral, Q4H PRN  alcohol 62 % (NOZIN NASAL SANITIZER) nasal solution, 1 Each, Each Nostril, 2x/day  amLODIPine (NORVASC) tablet, 10 mg, Oral, Daily  budesonide-formoterol (SYMBICORT) 160 mcg-4.5 mcg per inhalation oral inhaler - "Respiratory to administer", 2 Puff, Inhalation, 2x/day  [START ON 01/08/2022] calcitriol (ROCALTROL) capsule, 1 mcg, Oral, M, W, and F  calcium acetate (PHOSLO) capsule, 1,334 mg, Oral, 3x/day-Meals  cinacalcet (SENSIPAR) tablet, 30 mg, Oral, QPM  cloNIDine (CATAPRES) tablet, 0.1 mg, Oral, Now  cloNIDine (CATAPRES) tablet, 0.2 mg, Oral, 2x/day  clopidogrel (PLAVIX) 75 mg tablet, 75 mg, Oral, Daily  cyclobenzaprine (FLEXERIL) tablet, 5 mg, Oral, 2x/day PRN  doxazosin (CARDURA) tablet, 4 mg, Oral, QPM  famotidine (PEPCID) tablet, 40 mg, Oral, Daily  furosemide (LASIX) tablet, 160 mg,  Oral, Daily  hydrALAZINE (APRESOLINE) tablet, 100 mg, Oral, Q8HRS  ipratropium-albuterol 0.5 mg-3 mg(2.5 mg base)/3 mL  Solution for Nebulization, 3 mL, Nebulization, 4x/day PRN  labetalol (NORMODYNE) tablet, 400 mg, Oral, 3x/day  losartan (COZAAR) tablet, 100 mg, Oral, Daily  montelukast (SINGULAIR) 10 mg tablet, 10 mg, Oral, QPM  ondansetron (ZOFRAN ODT) rapid dissolve tablet, 4 mg, Oral, Q8H PRN  pantoprazole (PROTONIX) delayed release tablet, 40 mg, Oral, Daily  traMADol (ULTRAM) tablet, 50 mg, Oral, Q6H PRN      Allergies   Allergen Reactions    Lisinopril Swelling     Tongue and throat swelling    Cardene [Nicardipine]  Other Adverse Reaction (Add comment)     Chest pain     Oxycodone Itching       ROS:   Constitutional: negative for fevers, chills, sweats, and fatigue  Eyes: negative for visual disturbance, irritation, redness, and icterus  Ears, nose, mouth, throat, and face: negative for hearing loss, tinnitus, ear drainage, nasal congestion, epistaxis, and sore throat  Respiratory: negative for cough, sputum, hemoptysis, wheezing, or dyspnea on exertion  Cardiovascular: negative for chest pain, palpitations, , orthopnea, paroxysmal nocturnal dyspnea, and lower extremity edema  Gastrointestinal: negative for  nausea, vomiting, melena, diarrhea, constipation, and abdominal pain  Genitourinary:negative for frequency, dysuria, nocturia, urinary incontinence, hesitancy, and hematuria  Integument/breast: negative for rash, skin lesion(s), and pruritus  Hematologic/lymphatic: negative for easy bruising, bleeding, and petechiae  Musculoskeletal:negative for myalgias, arthralgias, neck pain, back pain, and muscle weakness  Neurological: negative for headaches, dizziness, seizures, speech problems, tremor, and weakness  Behavioral/Psych: negative for anxiety, behavior problems, mood swings, and sleep disturbance  Endocrine: negative for temperature intolerance  Allergic/Immunologic: negative for urticaria and  angioedema      EXAM:  Filed Vitals:    01/07/22 0351 01/07/22 1045 01/07/22 1300 01/07/22 1707   BP: (!) 149/100  (!) 138/91 (!) 109/57   Pulse: 68 73 70 67   Resp: 18  18 18    Temp: 36.3 C (97.3 F)  37.1 C (98.8 F) 36.9 C (98.4 F)   SpO2: 97%   99%       Constitutional: Alert and Oriented time person and place . Not in acute distress  HEENT : Vision and hearing grossly normal   Eyes: Conjunctiva clear., Pupils equal and round, reactive to light and accomodation. , Sclera non-icteric.   ENT: Nose without erythema. , Mouth mucous membranes moist.   Neck: JVD negative no thyromegaly or lymphadenopathy and supple, symmetrical, trachea midline  Respiratory: Clear to auscultation bilaterally. No wheezes, No rales, Good air exchange bilaterally  Cardiovascular: regular rate and rhythm no murmurs no rub  Gastrointestinal: Soft, non-tender, Bowel sounds normal, No hepatosplenomegaly  Genitourinary: no suprapubic tenderness  Lower Extremities : No edema . Pulses + 4 bilateral   Neurologic: Grossly normal. Speech clear.   Psychiatric: Normal mood and affect,   Labs:         CBC Results Differential Results   Recent Labs     01/06/22  0050 01/06/22  1221   WBC 9.3 10.7   HGB 9.6* 9.7*   HCT 30.2* 30.1*    Recent Labs     01/06/22  0050 01/06/22  1221   PMNS 82* 86*   LYMPHOCYTES 8* 6*   MONOCYTES 8 5   EOSINOPHIL 2 3   BASOPHILS 0  0.03 1  0.10   PMNABS 7.59 9.20*   LYMPHSABS 0.76* 0.60*   MONOSABS 0.75 0.50   EOSABS 0.14 0.30      BMP Results Other Chemistries Results   Recent Labs  01/06/22  0050 01/06/22  1220   SODIUM 134* 133*   POTASSIUM 4.7 4.5   CHLORIDE 93* 93*   CO2 25 24   BUN 61* 66*   CREATININE 12.50* 12.74*   GFR 3* 3*   ANIONGAP 16* 16*    Recent Labs     01/06/22  0050 01/06/22  1220   CALCIUM 9.7 10.1   ALBUMIN 3.6 4.5   MAGNESIUM  --  2.0      Liver/Pancreas Enzyme Results Blood Gas     Recent Labs     01/06/22  0050 01/06/22  1220   TOTALPROTEIN 7.7 8.3   ALBUMIN 3.6 4.5   AST 19 14   ALT 11 7    ALKPHOS 90 84   LIPASE  --  48    No results found for this encounter   Cardiac Results    Coags Results     TROPONIN I  Lab Results   Component Value Date    UHCEASTTROPI 0.06 ([place]) 12/13/2020    TROPONINI 180 (H) 01/06/2022    TROPONINI 170 (H) 01/06/2022    TROPONINI 179 (H) 01/06/2022         Recent Labs     01/06/22  1220   INR 1.10          Imaging Studies:    CT ANGIO CHEST FOR PULMONARY EMBOLUS W IV CONTRAST   Final Result   There are findings throughout the lungs which could represent volume overload as above.      Extensive coronary artery calcifications.         One or more dose reduction techniques were used (e.g., Automated exposure control, adjustment of the mA and/or kV according to patient size, use of iterative reconstruction technique).         Radiologist location ID: [id number]         XR AP MOBILE CHEST   Final Result   Patchy infiltrates are identified in the mid the lower lungs bilaterally which appear somewhat worse than on the previous study and may reflect acute pneumonia.         Radiologist location ID: [id number]              Assessment/Plan:  Active Hospital Problems    Diagnosis    Primary Problem: Dyspnea    Chest pain, unspecified type    Fluid overload    Anemia in chronic kidney disease    End-stage renal disease on hemodialysis (CMS [facility])      End-stage renal disease hemodialysis done today 2 days in a row for fluid overload feels better continue dialysis as scheduled Tuesday Thursday Saturday  Fluid overload resolving with dialysis ultrafiltration 3 L each dialysis  Anemia chronic kidney disease treatment per protocol      Richardean Sale, MD

## 2022-01-08 DIAGNOSIS — N186 End stage renal disease: Secondary | ICD-10-CM

## 2022-01-08 DIAGNOSIS — I132 Hypertensive heart and chronic kidney disease with heart failure and with stage 5 chronic kidney disease, or end stage renal disease: Secondary | ICD-10-CM

## 2022-01-08 DIAGNOSIS — Z91158 Patient's noncompliance with renal dialysis for other reason: Secondary | ICD-10-CM

## 2022-01-08 DIAGNOSIS — D631 Anemia in chronic kidney disease: Secondary | ICD-10-CM

## 2022-01-08 DIAGNOSIS — Z992 Dependence on renal dialysis: Secondary | ICD-10-CM

## 2022-01-08 DIAGNOSIS — R079 Chest pain, unspecified: Secondary | ICD-10-CM

## 2022-01-08 DIAGNOSIS — I509 Heart failure, unspecified: Secondary | ICD-10-CM

## 2022-01-08 LAB — COMPREHENSIVE METABOLIC PANEL, NON-FASTING
ALBUMIN/GLOBULIN RATIO: 1.1 (ref 0.8–1.4)
ALBUMIN: 4.4 g/dL (ref 3.5–5.7)
ALKALINE PHOSPHATASE: 80 U/L (ref 34–104)
ALT (SGPT): 7 U/L (ref 7–52)
ANION GAP: 9 mmol/L (ref 4–13)
AST (SGOT): 14 U/L (ref 13–39)
BILIRUBIN TOTAL: 0.7 mg/dL (ref 0.3–1.2)
BUN/CREA RATIO: 3 — ABNORMAL LOW (ref 6–22)
BUN: 20 mg/dL (ref 7–25)
CALCIUM, CORRECTED: 9.6 mg/dL (ref 8.9–10.8)
CALCIUM: 9.9 mg/dL (ref 8.6–10.3)
CHLORIDE: 95 mmol/L — ABNORMAL LOW (ref 98–107)
CO2 TOTAL: 32 mmol/L — ABNORMAL HIGH (ref 21–31)
CREATININE: 5.86 mg/dL — ABNORMAL HIGH (ref 0.60–1.30)
ESTIMATED GFR: 8 mL/min/{1.73_m2} — ABNORMAL LOW (ref >59)
GLOBULIN: 4 (ref 2.9–5.4)
GLUCOSE: 107 mg/dL (ref 74–109)
OSMOLALITY, CALCULATED: 275 mOsm/kg (ref 270–290)
POTASSIUM: 3.9 mmol/L (ref 3.5–5.1)
PROTEIN TOTAL: 8.4 g/dL (ref 6.4–8.9)
SODIUM: 136 mmol/L (ref 136–145)

## 2022-01-08 LAB — CBC WITH DIFF
BASOPHIL #: 0.1 10*3/uL (ref 0.00–0.10)
BASOPHIL %: 2 % — ABNORMAL HIGH (ref 0–1)
EOSINOPHIL #: 0.4 10*3/uL (ref 0.00–0.50)
EOSINOPHIL %: 6 %
HCT: 30.4 % — ABNORMAL LOW (ref 31.2–41.9)
HGB: 9.8 g/dL — ABNORMAL LOW (ref 10.9–14.3)
LYMPHOCYTE #: 0.8 10*3/uL — ABNORMAL LOW (ref 1.00–3.00)
LYMPHOCYTE %: 14 % — ABNORMAL LOW (ref 16–44)
MCH: 28.7 pg (ref 24.7–32.8)
MCHC: 32.2 g/dL — ABNORMAL LOW (ref 32.3–35.6)
MCV: 89.1 fL (ref 75.5–95.3)
MONOCYTE #: 0.9 10*3/uL (ref 0.30–1.00)
MONOCYTE %: 16 % — ABNORMAL HIGH (ref 5–13)
MPV: 6.2 fL — ABNORMAL LOW (ref 7.9–10.8)
NEUTROPHIL #: 3.6 10*3/uL (ref 1.85–7.80)
NEUTROPHIL %: 63 % (ref 43–77)
PLATELETS: 232 10*3/uL (ref 140–440)
RBC: 3.41 10*6/uL — ABNORMAL LOW (ref 3.63–4.92)
RDW: 21.2 % — ABNORMAL HIGH (ref 12.3–17.7)
WBC: 5.7 10*3/uL (ref 3.8–11.8)

## 2022-01-08 LAB — MAGNESIUM: MAGNESIUM: 1.9 mg/dL (ref 1.9–2.7)

## 2022-01-08 MED ORDER — HYDROMORPHONE 2 MG/ML INJECTION WRAPPER
0.5000 mg | INJECTION | INTRAMUSCULAR | Status: AC
Start: 2022-01-08 — End: 2022-01-08
  Administered 2022-01-08: 0.5 mg via INTRAVENOUS
  Filled 2022-01-08: qty 1

## 2022-01-08 MED ORDER — ISOSORBIDE MONONITRATE ER 120 MG TABLET,EXTENDED RELEASE 24 HR
120.0000 mg | ORAL_TABLET | Freq: Every morning | ORAL | 0 refills | Status: DC
Start: 2022-01-08 — End: 2022-01-30

## 2022-01-08 MED ORDER — HYDROMORPHONE 2 MG/ML INJECTION WRAPPER
0.5000 mg | INJECTION | Freq: Once | INTRAMUSCULAR | Status: DC
Start: 2022-01-08 — End: 2022-01-08

## 2022-01-08 NOTE — Consults (Signed)
[facility]  Palliative Care Nurse Practitioner  Consult Note    [name], [name], 46 y.o. female  Date of Birth:  07/17/1975  MRN: [id number]  Admit Date: 01/06/2022   Attending: Hospitalist  Cam Hai, FNP   Reason for Consult: Goal Coordination    Chief Complaint:  Chest pain    HPI:  [name] is a 46 y.o. female who presented to the ED with complaints of chest and back pain and shortness of breath.  Patient states that she was just discharged from the hospital where she had dialysis every day for a week.  She stated when she went home, the elevator in the building was not working and she had to climb the stairs and got short of breath and had chest pains and came back to the ED.  Patient has hx of ESRD on dialysis, COPD, CHF. Chest x-ray patchy infiltrates in the mid to lower lungs bilaterally somewhat worse than on the previous study and may reflect acute pneumonia.  CT chest angio could represent volume overload, extensive coronary artery calcifications.  Patient admitted with acute on chronic dyspnea, ESRD on dialysis, HTN, anemia chronic disease, CAD, chest pain.  Nephrology was consulted for dialysis management. Hospitalist documenting plan for dialysis 01/08/22 and likely discharge after that.      Subjective:  Patient sitting up in bed upon arrival to the room.  Patient currently denies shortness of breath.  Patient is upset and angry stating that her chest pain is still present and every one keeps telling her that it is from her fluid overload and needs dialysis.  Patient states that she had dialysis every day during her last admission and still had chest pains.  She also verbalized that she isn't getting her medications here like she takes at home and is unable to get any pain medication except for Tramadol which she states does not help her.  I discussed patient's concerns with hospitalist NP.    Review of Systems:  ROS: Other than ROS in the HPI, all other systems were  negative.    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    Tricuspid valve regurgitation     Uncontrolled hypertension          Past Surgical History:   Procedure Laterality Date    CORONARY ARTERY ANGIOPLASTY      ESOPHAGOGASTRODUODENOSCOPY      HX BACK SURGERY      HX CHOLECYSTECTOMY      HX FOOT SURGERY Right     HX HYSTERECTOMY      HX TONSILLECTOMY            Family Medical History:       Problem Relation (Age of Onset)    Breast Cancer Mother    Coronary Artery Disease Mother    Diabetes type II Mother    Hypertension (High Blood Pressure) Father  Social Determinants of Health     Financial Resource Strain: No   Transportation Needs: None   Social Connections: Family interaction   Intimate Partner Violence: None   Housing Stability: Has housing     Social History     Socioeconomic History    Marital status: Divorced   Tobacco Use    Smoking status: Every Day     Packs/day: 1.00     Years: 10.00     Additional pack years: 0.00     Total pack years: 10.00     Types: Cigarettes     Passive exposure: Past    Smokeless tobacco: Former    Tobacco comments:     "Haven't had a cigerette in 2 weeks."   Vaping Use    Vaping Use: Never used   Substance and Sexual Activity    Alcohol use: Not Currently    Drug use: Yes     Types: Marijuana    Sexual activity: Not Currently     Social Determinants of Health     Social Connections: Medium Risk (01/06/2022)    Social Connections     SDOH Social Isolation: 1 or 2 times a week       Current Outpatient Medications   Medication Instructions    acetaminophen  (TYLENOL) 650 mg, Oral, EVERY 4 HOURS PRN    amLODIPine (NORVASC) 10 mg, Oral, DAILY, Take one tablet every day    budesonide-formoteroL (SYMBICORT) 160-4.5 mcg/actuation Inhalation oral inhaler 2 Puffs, Inhalation, 2 TIMES DAILY    calcitrioL (ROCALTROL) 1 mcg, Oral, EVERY MO, WE AND FR    calcium acetate(phosphat bind) (PHOSLO) 1,334 mg, Oral, 3 TIMES DAILY WITH MEALS    cinacalcet (SENSIPAR) 30 mg, Oral, EVERY EVENING    cloNIDine (CATAPRES-TTS) 0.3 mg, Transdermal, EVERY 7 DAYS    cloNIDine HCL (CATAPRES) 0.2 mg, Oral, 2 TIMES DAILY, Take one tablet twice a day    clopidogreL (PLAVIX) 75 mg, Oral, DAILY    cyclobenzaprine (FLEXERIL) 5 mg, Oral, 2 TIMES DAILY PRN    doxazosin (CARDURA) 4 mg, Oral, EVERY EVENING    doxycycline monohydrate (MONODOX) 100 mg, Oral, 2 TIMES DAILY    famotidine (PEPCID) 40 mg, Oral, DAILY    furosemide (LASIX) 160 mg, Oral, DAILY, Take after dialysis on dialysis days; otherwise take in morning daily.<BR>    hydrALAZINE (APRESOLINE) 100 mg, Oral, EVERY 8 HOURS (SCHEDULED)    ipratropium-albuterol 0.5 mg-3 mg(2.5 mg base)/3 mL Solution for Nebulization 3 mL, Nebulization, 4 TIMES DAILY PRN    Isosorbide Mononitrate (IMDUR) 120 mg, Oral, EVERY MORNING    labetaloL (NORMODYNE) 400 mg, Oral, 3 TIMES DAILY    losartan (COZAAR) 100 mg, Oral, DAILY    montelukast (SINGULAIR) 10 mg, Oral, EVERY EVENING    nitroGLYCERIN (NITROSTAT) 0.4 mg, Sublingual, EVERY 5 MIN PRN, for 3 doses over 15 minutes    omeprazole (PRILOSEC) 40 mg, Oral, DAILY    ondansetron (ZOFRAN ODT) 4 mg, Oral, EVERY 8 HOURS PRN    pantoprazole (PROTONIX) 40 mg, Oral, DAILY    rosuvastatin (CRESTOR) 40 mg, Oral, EVERY EVENING, Take one tablet every day    traMADoL (ULTRAM) 50 mg, Oral, EVERY 6 HOURS PRN      Allergies   Allergen Reactions    Lisinopril Swelling     Tongue and throat swelling    Cardene [Nicardipine]  Other Adverse Reaction (Add comment)     Chest pain     Oxycodone Itching  Physical Exam:  Constitutional:  no distress  Respiratory: Clear to auscultation bilaterally.   Cardiovascular: regular rate and rhythm  Gastrointestinal: Soft, non-tender, Bowel sounds normal, non-distended  Extremities: No edema  Integumentary:  Skin warm and dry  Neurologic: Alert and oriented x3    BP 154/87   Pulse 60   Temp 36.6 C (97.9 F)   Resp 18   Ht 1.702 m (5\' 7" )   Wt 89.8 kg (198 lb)   SpO2 100%   BMI 31.01 kg/m        Pain: Numeric 7     Labs:  Lab Results Today:  No results found for any visits on 01/06/22 (from the past 24 hour(s)).    Imaging Studies:  CT ANGIO CHEST FOR PULMONARY EMBOLUS W IV CONTRAST    Result Date: 01/06/2022  Impression There are findings throughout the lungs which could represent volume overload as above. Extensive coronary artery calcifications. One or more dose reduction techniques were used (e.g., Automated exposure control, adjustment of the mA and/or kV according to patient size, use of iterative reconstruction technique). Radiologist location ID: [id number]    Images and Reports reviewed to current date.    Care collaborated with the following providers:  Michel Santee, FNP, Charge nurse Debbie, RN    ACP:  Patient confirms that her MPOAs are Murriel Hopper and Prudence Davidson.  A copy is in EPIC.    Code Status:  Patient wishes to remain a full code.    GOC:  The patient is currently on community palliative services.  I spoke to the community palliative NP and she updated me that on her last visit the patient was upset about her health and they had discussed hospice which the patient wanted to talk with someone from hospice but had got readmitted to the hospital before that conversation could take place.  The patient is very angry and upset during my visit today and voiced multiple concerns regarding care received this admission, her medications, cleanliness of her room, and her chest pain.  Patient states that everything that is wrong with her is because of a doctor.  She states that her  doctors have told her there was nothing wrong with her heart and she followed with a cardiologist in [place] this summer who told her that her doctors were trying to kill her that there was something wrong with heart and the only way to fix it was with a tripple bypass but she was unable to have this procedure because it would kill her.  Patient states, "I have a death sentence either way".  Patient states that everyone states the chest pain is because she has fluid overload and needs dialysis but still has chest pain even with dialysis every day.  Discussed with the patient that her chest pain is likely multifactorial due to her multiple chronic health conditions and being on dialysis.  Patient states that she also gets treated like a drug addict when she tells someone how bad she is hurting and that tramadol does not help her pain.  Patient had multiple complaints regarding nursing care and also with cleanliness of the room.  I discussed with the patient regarding her previously wanting to talk with hospice at home.  She states that she would like to talk to them when she gets discharged because she needs her pain managed and they are able to do that.  I also discussed with her that she would have to stop dialysis in order to  go on hospice services.  She states that she is tired and is thinking about stopping but has not made a decision yet.  She states that her nephrologist Dr. Monia Pouch has discussed with her about doing dialysis at home 5 days a week instead of 3 days a week but does not think she could tolerate this.  Patient states that she was going to talk with [facility] before coming to the hospital and would like to talk to them again when discharged.  Emotional support provided to the patient.  I notified Felicity, RN at [facility] that the patient was currently in the hospital and that she would like someone from hospice to follow up with her when discharged to further discuss hospice and  their services.  I spoke to the patient advocate at the hospital regarding patient's complaints and she is going to follow up with the patient.  I spoke to charge nurse Debbie, RN and updated her on the patient's complaints regarding nursing and housekeeping and she is going to have the manager of the unit talk with the patient.  I updated hospitalist Michel Santee, FNP of my visit with the patient and her concerns.      PPS prior to hospitalization: 50%    PPS currently: 50%      Persons present and participating in discussion: Patient      Was the conversation voluntary? Yes      Plan:  Continue community palliative services when discharged.    Patient Disposition/Discharge needs:  Home    ACP/GOC time:     30 minutes    Total visit time:     60 minutes including ACP/GOC time, Face to face and reviewing medical records.    Thank you for this consult.    Kreg Shropshire, APRN,NP-C  Palliative Care Nurse Practitioner    This note may have been partially generated using [employer] Fluency Direct system and there may be some incorrect words, spellings, and punctuation that were not noted in checking the note before saving.

## 2022-01-08 NOTE — Discharge Summary (Signed)
[facility]  DISCHARGE SUMMARY    PATIENT NAME:  [name], [name]  MRN:  [id number]  DOB:  1975/05/27    ENCOUNTER DATE:  01/06/2022  INPATIENT ADMISSION DATE:   DISCHARGE DATE:  01/08/2022    ATTENDING PHYSICIAN: Dawn Jan, MD  SERVICE: PRN HOSPITALIST 1  PRIMARY CARE PHYSICIAN: Dawn Hai, FNP       No lay caregiver identified.    PRIMARY DISCHARGE DIAGNOSIS: Dyspnea  Active Hospital Problems    Diagnosis Date Noted    Principal Problem: Dyspnea [R06.00] 01/06/2022    Congestive heart failure, unspecified HF chronicity, unspecified heart failure type (CMS HCC) [I50.9] 01/07/2022    Chest pain, unspecified type [R07.9] 01/06/2022    Fluid overload [E87.70] 08/11/2021    Anemia in chronic kidney disease [N18.9, D63.1] 08/05/2021    End-stage renal disease on hemodialysis (CMS HCC) [N18.6, Z99.2] 03/02/2013      Resolved Hospital Problems   No resolved problems to display.     Active Non-Hospital Problems    Diagnosis Date Noted    Pneumonia 01/01/2022    Hypertension 01/01/2022    Hyperkalemia 01/01/2022    Bilateral pneumonia 01/01/2022    Non-STEMI (non-ST elevated myocardial infarction) (CMS HCC) 12/16/2021    Hypertensive emergency 12/16/2021    End-stage renal disease (ESRD) (CMS [place]) 12/16/2021    Tobacco use disorder 11/14/2021    Acute exacerbation of COPD with asthma (CMS [place])  10/16/2021    H/O heart artery stent 10/16/2021    Hypertensive urgency 09/29/2021    Acute respiratory failure with hypoxia (CMS HCC) 08/28/2021    Coronary artery disease involving native coronary artery 07/01/2021    Noncompliance 07/01/2021    Tobacco dependence 12/04/2020    Insomnia 12/04/2020           Current Discharge Medication List        CONTINUE these medications - NO CHANGES were made during your visit.        Details   acetaminophen 325 mg Tablet  Commonly known as: TYLENOL   650 mg, Oral, EVERY 4 HOURS PRN  Refills: 0     amLODIPine 10 mg Tablet  Commonly known as: NORVASC   10 mg, Oral, DAILY,  Take one tablet every day  Refills: 0     budesonide-formoteroL 160-4.5 mcg/actuation oral inhaler  Commonly known as: SYMBICORT   2 Puffs, Inhalation, 2 TIMES DAILY  Refills: 0     calcitrioL 0.5 mcg Capsule  Commonly known as: ROCALTROL   1 mcg, Oral, EVERY MO, WE AND FR  Refills: 0     calcium acetate(phosphat bind) 667 mg Capsule  Commonly known as: PHOSLO   1,334 mg, Oral, 3 TIMES DAILY WITH MEALS  Qty: 180 Capsule  Refills: 0     cinacalcet 30 mg Tablet  Commonly known as: SENSIPAR   30 mg, Oral, EVERY EVENING  Refills: 0     cloNIDine 0.3 mg/24 hr Patch Weekly  Commonly known as: CATAPRES-TTS   0.3 mg, Transdermal, EVERY 7 DAYS  Refills: 0     cloNIDine HCL 0.2 mg Tablet  Commonly known as: CATAPRES   0.2 mg, Oral, 2 TIMES DAILY, Take one tablet twice a day  Refills: 0     clopidogreL 75 mg Tablet  Commonly known as: PLAVIX   75 mg, Oral, DAILY  Refills: 0     cyclobenzaprine 5 mg Tablet  Commonly known as: FLEXERIL   5 mg, Oral, 2 TIMES DAILY PRN  Refills:  0     doxazosin 4 mg Tablet  Commonly known as: CARDURA   4 mg, Oral, EVERY EVENING  Refills: 0     famotidine 40 mg Tablet  Commonly known as: PEPCID   40 mg, Oral, DAILY  Refills: 0     furosemide 80 mg Tablet  Commonly known as: LASIX   160 mg, Oral, DAILY, Take after dialysis on dialysis days; otherwise take in morning daily.   Refills: 0     hydrALAZINE 100 mg Tablet  Commonly known as: APRESOLINE   100 mg, Oral, EVERY 8 HOURS (SCHEDULED)  Qty: 90 Tablet  Refills: 0     ipratropium-albuteroL 0.5 mg-3 mg(2.5 mg base)/3 mL nebulizer solution  Commonly known as: DUONEB   3 mL, Nebulization, 4 TIMES DAILY PRN  Refills: 0     Isosorbide Mononitrate 120 mg Tablet Sustained Release 24 hr  Commonly known as: IMDUR   120 mg, Oral, EVERY MORNING  Qty: 30 Tablet  Refills: 0     labetaloL 200 mg Tablet  Commonly known as: NORMODYNE   400 mg, Oral, 3 TIMES DAILY  Refills: 0     losartan 100 mg Tablet  Commonly known as: COZAAR   100 mg, Oral, DAILY  Refills: 0      montelukast 10 mg Tablet  Commonly known as: SINGULAIR   10 mg, Oral, EVERY EVENING  Refills: 0     nitroGLYCERIN 0.4 mg Tablet, Sublingual  Commonly known as: NITROSTAT   0.4 mg, Sublingual, EVERY 5 MIN PRN, for 3 doses over 15 minutes  Qty: 3 Tablet  Refills: 0     omeprazole 40 mg Capsule, Delayed Release(E.C.)  Commonly known as: PRILOSEC   40 mg, Oral, DAILY  Refills: 0     ondansetron 4 mg Tablet, Rapid Dissolve  Commonly known as: ZOFRAN ODT   4 mg, Oral, EVERY 8 HOURS PRN  Qty: 12 Tablet  Refills: 0     pantoprazole 40 mg Tablet, Delayed Release (E.C.)  Commonly known as: PROTONIX   40 mg, Oral, DAILY  Refills: 0     rosuvastatin 40 mg Tablet  Commonly known as: CRESTOR   40 mg, Oral, EVERY EVENING, Take one tablet every day  Refills: 0     traMADoL 50 mg Tablet  Commonly known as: ULTRAM   50 mg, Oral, EVERY 6 HOURS PRN  Qty: 20 Tablet  Refills: 0            STOP taking these medications.      doxycycline monohydrate 100 mg Capsule  Commonly known as: [place]            Discharge med list refreshed?  YES     Allergies   Allergen Reactions    Lisinopril Swelling     Tongue and throat swelling    Cardene [Nicardipine]  Other Adverse Reaction (Add comment)     Chest pain     Oxycodone Itching     HOSPITAL PROCEDURE(S):   No orders of the defined types were placed in this encounter.      REASON FOR [name] is a 46 year old female who presented to the ER on 01/06/22 with complaints of increased shortness of breath, orthopnea, nonproductive cough, after missing last 3 dialysis treatments.  She denies any chest pain, arm/back/jaw pain, fever, chills, N//V/D.  The patient was placed in observation in order to get dialysis.  The patient received dialysis on 01/06/22,  01/07/22, and 01/08/22, which got her back on schedule.  No other issues or complaints identified.  The patient can be discharged to home after dialysis today if repeat labs are stable.  Home medications  restarted as appropriate.  Patient strongly encouraged to not miss dialysis treatments.  Dr. Alwyn Malone made aware of discharge plans and he is in agreement.  The hospitalist examined the patient, reviewed material, and agreed with discharge at this time.  This discharge took greater than 30 minutes.        TRANSITION/POST DISCHARGE CARE/PENDING TESTS/REFERRALS: Follow up with PCP In 1-2 weeks.  Follow up with Nephrology in dialysis clinic.  Continue dialysis on M-W-F.      CONDITION ON DISCHARGE:  A. Ambulation: Full ambulation  B. Self-care Ability: With full assistance  C. Cognitive Status Alert and Oriented x 3  D. Code status at discharge:  Full code       LINES/DRAINS/WOUNDS AT DISCHARGE:   Patient Lines/Drains/Airways Status       Active Line / Dialysis Catheter / Dialysis Graft / Drain / Airway / Wound       Name Placement date Placement time Site Days    Peripheral IV Left Cephalic  (lateral side of arm) 01/06/22  1123  -- 2    AV Fistula Left;Upper Arm 12/05/20  1502  -- 399    Dialysis Catheter Cuffed/Tunneled 08/13/21  1316  -- 148                    DISCHARGE DISPOSITION:  Home discharge  DISCHARGE INSTRUCTIONS:  Post-Discharge Follow Up Appointments       Follow up with Dawn Hai, FNP in 1 week(s)    Phone: [phone]    Where: [place], [place] [address]      Wednesday Malone 31, 2024    Return Patient Visit with Beather Arbour, MD at  1:00 PM      Nephrology, [place], [place] [address]  [phone]          No discharge procedures on file.       Evette Doffing, FNP-BC    Copies sent to Care Team         Relationship Specialty Notifications Start End    Deel, Leona Carry, FNP PCP - General NURSE PRACTITIONER  12/04/20     Phone: [phone] Fax: [phone]         [place] [address]    Beather Arbour, [place]  11/15/21     Phone: [phone] Fax: [phone]         [address]  [place] [address]    Wilma Flavin, MD  PULMONARY DISEASE  11/15/21     Phone: [phone] Fax: [phone]         [place] [address]    Sherlyn Lees, MD  CARDIOVASCULAR DISEASE  11/15/21     Phone: [phone] Fax: [phone]         [address] [place] [address]            Referring providers can utilize [email or web address] to access their referred [place] patient's information.

## 2022-01-08 NOTE — Consults (Signed)
[facility]   Nephrology Consult      [place], , 46 y.o. female  Encounter Start Date:  01/06/2022  Inpatient Admission Date:    Date of Birth:  1975/04/27    Admitting Provider:  Hospitalist service    Reason for Consult:  End-stage renal disease on hemodialysis    HPI:  46 year old female with past medical history of heart disease, ESRD on HD, hypertension presented to the hospital with shortness of breath, patient reported that she is not following fluid restrictions.  Patient's symptoms has been going since yesterday evening was started getting worse decided to come to hospital for further evaluation.  Patient was due for dialysis today as she gets dialysis Monday Wednesday and Friday she could not make it to dialysis.  Further workup indicated fluid overload chest pain and shortness of breath.   01-07-2022  Nephrology progress note on this patient with end-stage renal disease hemodialysis 3 times a week missed dialysis came in with fluid overload dialyzed yesterday and dialyze today 3 L ultrafiltration each session anemia of chronic kidney disease treatment per protocol continue dialysis as scheduled feels better less short of breath   01-08-2022  Nephrology progress note on this patient with end-stage renal disease hemodialysis 3 times a week admitted with fluid overload and dialyzed 3 days in a row dialyzed today continue dialysis Monday Wednesday Friday she is feels better less short of breath anemia of chronic kidney disease treatment per protocol    Past Medical History:   Diagnosis Date    Asthma     Chronic diastolic CHF (congestive heart failure) (CMS HCC)     Dependence on supplemental oxygen     Esophageal reflux     ESRD (end stage renal disease) (CMS HCC)     History of anemia due to CKD     MI (myocardial infarction) (CMS HCC)     Mitral valve regurgitation     Pulmonary edema     Sleep apnea     SVC syndrome     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    Tricuspid valve regurgitation     Uncontrolled hypertension          Medications Prior to Admission       Prescriptions    acetaminophen (TYLENOL) 325 mg Oral Tablet    Take 2 Tablets (650 mg total) by mouth Every 4 hours as needed    amLODIPine (NORVASC) 10 mg Oral Tablet    Take 1 Tablet (10 mg total) by mouth Once a day Take one tablet every day    budesonide-formoteroL (SYMBICORT) 160-4.5 mcg/actuation Inhalation oral inhaler    Take 2 Puffs by inhalation Twice daily    calcitrioL (ROCALTROL) 0.5 mcg Oral Capsule    Take 2 Capsules (1 mcg total) by mouth Every Monday, Wednesday and Friday    calcium acetate,phosphat bind, (PHOSLO) 667 mg Oral Capsule    Take 2 Capsules (1,334 mg total) by mouth Three times daily with meals for 30 days    cinacalcet (SENSIPAR) 30 mg Oral Tablet    Take 1 Tablet (30 mg total) by mouth Every evening    cloNIDine (CATAPRES-TTS) 0.3 mg/24 hr Transdermal Patch Weekly    Place 1 Patch (0.3 mg total) on the skin Every 7 days    cloNIDine HCL (CATAPRES) 0.2 mg Oral Tablet    Take 1 Tablet (0.2 mg total) by mouth Twice daily Take one tablet twice a  day    clopidogreL (PLAVIX) 75 mg Oral Tablet    Take 1 Tablet (75 mg total) by mouth Once a day    cyclobenzaprine (FLEXERIL) 5 mg Oral Tablet    Take 1 Tablet (5 mg total) by mouth Twice per day as needed for Muscle spasms    doxazosin (CARDURA) 4 mg Oral Tablet    Take 1 Tablet (4 mg total) by mouth Every evening    doxycycline monohydrate (MONODOX) 100 mg Oral Capsule    Take 1 Capsule (100 mg total) by mouth Twice daily for 6 days    famotidine (PEPCID) 40 mg Oral Tablet    Take 1 Tablet (40 mg total) by mouth Once a day    furosemide (LASIX) 80 mg Oral Tablet    Take 2 Tablets (160 mg total) by mouth Once a day Take after dialysis on dialysis days; otherwise take in morning daily.    hydrALAZINE (APRESOLINE) 100 mg Oral Tablet    Take 1 Tablet (100 mg total) by mouth Every 8 hours for 30 days     ipratropium-albuterol 0.5 mg-3 mg(2.5 mg base)/3 mL Solution for Nebulization    Take 3 mL by nebulization Four times a day as needed    labetaloL (NORMODYNE) 200 mg Oral Tablet    Take 2 Tablets (400 mg total) by mouth Three times a day    losartan (COZAAR) 100 mg Oral Tablet    Take 1 Tablet (100 mg total) by mouth Once a day    montelukast (SINGULAIR) 10 mg Oral Tablet    Take 1 Tablet (10 mg total) by mouth Every evening    nitroGLYCERIN (NITROSTAT) 0.4 mg Sublingual Tablet, Sublingual    Place 1 Tablet (0.4 mg total) under the tongue Every 5 minutes as needed for Chest pain for up to 3 doses for 3 doses over 15 minutes    omeprazole (PRILOSEC) 40 mg Oral Capsule, Delayed Release(E.C.)    Take 1 Capsule (40 mg total) by mouth Once a day    ondansetron (ZOFRAN ODT) 4 mg Oral Tablet, Rapid Dissolve    Take 1 Tablet (4 mg total) by mouth Every 8 hours as needed for Nausea/Vomiting    pantoprazole (PROTONIX) 40 mg Oral Tablet, Delayed Release (E.C.)    Take 1 Tablet (40 mg total) by mouth Once a day    rosuvastatin (CRESTOR) 40 mg Oral Tablet    Take 1 Tablet (40 mg total) by mouth Every evening Take one tablet every day    traMADoL (ULTRAM) 50 mg Oral Tablet    Take 1 Tablet (50 mg total) by mouth Every 6 hours as needed for Pain    isosorbide mononitrate (IMDUR) 120 mg Oral Tablet Sustained Release 24 hr    Take 1 Tablet (120 mg total) by mouth Every morning for 30 days          acetaminophen (TYLENOL) tablet, 650 mg, Oral, Q4H PRN  alcohol 62 % (NOZIN NASAL SANITIZER) nasal solution, 1 Each, Each Nostril, 2x/day  amLODIPine (NORVASC) tablet, 10 mg, Oral, Daily  budesonide-formoterol (SYMBICORT) 160 mcg-4.5 mcg per inhalation oral inhaler - "Respiratory to administer", 2 Puff, Inhalation, 2x/day  calcitriol (ROCALTROL) capsule, 1 mcg, Oral, M, W, and F  calcium acetate (PHOSLO) capsule, 1,334 mg, Oral, 3x/day-Meals  cinacalcet (SENSIPAR) tablet, 30 mg, Oral, QPM  cloNIDine (CATAPRES) tablet, 0.1 mg, Oral,  Now  cloNIDine (CATAPRES) tablet, 0.2 mg, Oral, 2x/day  clopidogrel (PLAVIX) 75 mg tablet, 75 mg, Oral, Daily  cyclobenzaprine (  FLEXERIL) tablet, 5 mg, Oral, 2x/day PRN  doxazosin (CARDURA) tablet, 4 mg, Oral, QPM  famotidine (PEPCID) tablet, 40 mg, Oral, Daily  furosemide (LASIX) tablet, 160 mg, Oral, Daily  hydrALAZINE (APRESOLINE) tablet, 100 mg, Oral, Q8HRS  ipratropium-albuterol 0.5 mg-3 mg(2.5 mg base)/3 mL Solution for Nebulization, 3 mL, Nebulization, 4x/day PRN  labetalol (NORMODYNE) tablet, 400 mg, Oral, 3x/day  losartan (COZAAR) tablet, 100 mg, Oral, Daily  montelukast (SINGULAIR) 10 mg tablet, 10 mg, Oral, QPM  ondansetron (ZOFRAN ODT) rapid dissolve tablet, 4 mg, Oral, Q8H PRN  pantoprazole (PROTONIX) delayed release tablet, 40 mg, Oral, Daily  traMADol (ULTRAM) tablet, 50 mg, Oral, Q6H PRN      Allergies   Allergen Reactions    Lisinopril Swelling     Tongue and throat swelling    Cardene [Nicardipine]  Other Adverse Reaction (Add comment)     Chest pain     Oxycodone Itching       ROS:   Constitutional: negative for fevers, chills, sweats, and fatigue  Eyes: negative for visual disturbance, irritation, redness, and icterus  Ears, nose, mouth, throat, and face: negative for hearing loss, tinnitus, ear drainage, nasal congestion, epistaxis, and sore throat  Respiratory: negative for cough, sputum, hemoptysis, wheezing, or dyspnea on exertion  Cardiovascular: negative for chest pain, palpitations, , orthopnea, paroxysmal nocturnal dyspnea, and lower extremity edema  Gastrointestinal: negative for  nausea, vomiting, melena, diarrhea, constipation, and abdominal pain  Genitourinary:negative for frequency, dysuria, nocturia, urinary incontinence, hesitancy, and hematuria  Integument/breast: negative for rash, skin lesion(s), and pruritus  Hematologic/lymphatic: negative for easy bruising, bleeding, and petechiae  Musculoskeletal:negative for myalgias, arthralgias, neck pain, back pain, and muscle  weakness  Neurological: negative for headaches, dizziness, seizures, speech problems, tremor, and weakness  Behavioral/Psych: negative for anxiety, behavior problems, mood swings, and sleep disturbance  Endocrine: negative for temperature intolerance  Allergic/Immunologic: negative for urticaria and angioedema      EXAM:  Filed Vitals:    01/08/22 1027 01/08/22 1101 01/08/22 1246 01/08/22 1747   BP:   (!) 117/94 (!) 164/94   Pulse:  60 60 63   Resp:   18 18   Temp:    36.9 C (98.4 F)   SpO2: 97%  100% 100%       Constitutional: Alert and Oriented time person and place . Not in acute distress  HEENT : Vision and hearing grossly normal   Eyes: Conjunctiva clear., Pupils equal and round, reactive to light and accomodation. , Sclera non-icteric.   ENT: Nose without erythema. , Mouth mucous membranes moist.   Neck: JVD negative no thyromegaly or lymphadenopathy and supple, symmetrical, trachea midline  Respiratory: Clear to auscultation bilaterally. No wheezes, No rales, Good air exchange bilaterally  Cardiovascular: regular rate and rhythm no murmurs no rub  Gastrointestinal: Soft, non-tender, Bowel sounds normal, No hepatosplenomegaly  Genitourinary: no suprapubic tenderness  Lower Extremities : No edema . Pulses + 4 bilateral   Neurologic: Grossly normal. Speech clear.   Psychiatric: Normal mood and affect,   Labs:         CBC Results Differential Results   Recent Labs     01/06/22  0050 01/06/22  1221   WBC 9.3 10.7   HGB 9.6* 9.7*   HCT 30.2* 30.1*    Recent Labs     01/06/22  0050 01/06/22  1221   PMNS 82* 86*   LYMPHOCYTES 8* 6*   MONOCYTES 8 5   EOSINOPHIL 2 3   BASOPHILS 0  0.03 1  0.10   PMNABS 7.59 9.20*   LYMPHSABS 0.76* 0.60*   MONOSABS 0.75 0.50   EOSABS 0.14 0.30      BMP Results Other Chemistries Results   Recent Labs     01/06/22  0050 01/06/22  1220   SODIUM 134* 133*   POTASSIUM 4.7 4.5   CHLORIDE 93* 93*   CO2 25 24   BUN 61* 66*   CREATININE 12.50* 12.74*   GFR 3* 3*   ANIONGAP 16* 16*    Recent  Labs     01/06/22  0050 01/06/22  1220   CALCIUM 9.7 10.1   ALBUMIN 3.6 4.5   MAGNESIUM  --  2.0      Liver/Pancreas Enzyme Results Blood Gas     Recent Labs     01/06/22  0050 01/06/22  1220   TOTALPROTEIN 7.7 8.3   ALBUMIN 3.6 4.5   AST 19 14   ALT 11 7   ALKPHOS 90 84   LIPASE  --  48    No results found for this encounter   Cardiac Results    Coags Results     TROPONIN I  Lab Results   Component Value Date    UHCEASTTROPI 0.06 ([place]) 12/13/2020    TROPONINI 180 (H) 01/06/2022    TROPONINI 170 (H) 01/06/2022    TROPONINI 179 (H) 01/06/2022         Recent Labs     01/06/22  1220   INR 1.10          Imaging Studies:    CT ANGIO CHEST FOR PULMONARY EMBOLUS W IV CONTRAST   Final Result   There are findings throughout the lungs which could represent volume overload as above.      Extensive coronary artery calcifications.         One or more dose reduction techniques were used (e.g., Automated exposure control, adjustment of the mA and/or kV according to patient size, use of iterative reconstruction technique).         Radiologist location ID: [id number]         XR AP MOBILE CHEST   Final Result   Patchy infiltrates are identified in the mid the lower lungs bilaterally which appear somewhat worse than on the previous study and may reflect acute pneumonia.         Radiologist location ID: [id number]              Assessment/Plan:  Active Hospital Problems    Diagnosis    Primary Problem: Dyspnea    Congestive heart failure, unspecified HF chronicity, unspecified heart failure type (CMS HCC)    Chest pain, unspecified type    Fluid overload    Anemia in chronic kidney disease    End-stage renal disease on hemodialysis (CMS HCC)      End-stage renal disease hemodialysis 3 times a week dialyzed 3 days in a row for fluid overload feels better less short of breath continue dialysis schedule Monday Wednesday Friday  Anemia chronic kidney disease treatment per protocol  Chest pain treatment per her medical team      Richardean Sale, MD

## 2022-01-09 ENCOUNTER — Telehealth (HOSPITAL_COMMUNITY): Payer: Self-pay

## 2022-01-09 NOTE — Nurses Notes (Signed)
Patient states ride will be here at 7am for discharge. Notified hospitalist. OK to d/c.

## 2022-01-09 NOTE — Care Plan (Signed)
Patient admitted with dyspnea. Patient resting in bed with call bell in reach. Patient up adlib. Patient on IV lasix.  Labs and vitals being monitored.  Problem: Adult Inpatient Plan of Care  Goal: Patient-Specific Goal (Individualized)  Outcome: Ongoing (see interventions/notes)

## 2022-01-09 NOTE — Nurses Notes (Signed)
Patient's ride arrived at this time.  Patient left facility at this time with belongings and discharge instructions.

## 2022-01-09 NOTE — Nurses Notes (Signed)
Discharge instructions provided at this time with patient voicing understanding.  IV removed from left forearm and telemetry monitor removed.  Waiting on ride to arrive.

## 2022-01-11 LAB — ADULT ROUTINE BLOOD CULTURE, SET OF 2 BOTTLES (BACTERIA AND YEAST)
BLOOD CULTURE, ROUTINE: NO GROWTH
BLOOD CULTURE, ROUTINE: NO GROWTH

## 2022-01-16 ENCOUNTER — Emergency Department
Admission: EM | Admit: 2022-01-16 | Discharge: 2022-01-16 | Disposition: A | Discharge status: Home / Self Care | Payer: Medicare Other | Attending: PHYSICIAN ASSISTANT | Admitting: PHYSICIAN ASSISTANT

## 2022-01-16 ENCOUNTER — Other Ambulatory Visit: Payer: Self-pay

## 2022-01-16 ENCOUNTER — Encounter (HOSPITAL_COMMUNITY): Payer: Self-pay

## 2022-01-16 ENCOUNTER — Emergency Department (HOSPITAL_COMMUNITY): Payer: Medicare Other

## 2022-01-16 DIAGNOSIS — Z992 Dependence on renal dialysis: Secondary | ICD-10-CM | POA: Insufficient documentation

## 2022-01-16 DIAGNOSIS — Z9981 Dependence on supplemental oxygen: Secondary | ICD-10-CM | POA: Insufficient documentation

## 2022-01-16 DIAGNOSIS — N186 End stage renal disease: Secondary | ICD-10-CM | POA: Insufficient documentation

## 2022-01-16 DIAGNOSIS — R079 Chest pain, unspecified: Secondary | ICD-10-CM | POA: Insufficient documentation

## 2022-01-16 LAB — TROPONIN-I
TROPONIN I: 67 ng/L — ABNORMAL HIGH (ref <15)
TROPONIN I: 69 ng/L — ABNORMAL HIGH (ref <15)
TROPONIN I: 64 ng/L — ABNORMAL HIGH (ref <15)

## 2022-01-16 LAB — CBC WITH DIFF
BASOPHIL #: 0.3 10*3/uL — ABNORMAL HIGH (ref 0.00–0.10)
BASOPHIL %: 2 % — ABNORMAL HIGH (ref 0–1)
EOSINOPHIL #: 0.5 10*3/uL (ref 0.00–0.50)
EOSINOPHIL %: 5 %
HCT: 31.4 % (ref 31.2–41.9)
HGB: 9.9 g/dL — ABNORMAL LOW (ref 10.9–14.3)
LYMPHOCYTE #: 1 10*3/uL (ref 1.00–3.00)
LYMPHOCYTE %: 8 % — ABNORMAL LOW (ref 16–44)
MCH: 28.3 pg (ref 24.7–32.8)
MCHC: 31.5 g/dL — ABNORMAL LOW (ref 32.3–35.6)
MCV: 89.9 fL (ref 75.5–95.3)
MONOCYTE #: 0.7 10*3/uL (ref 0.30–1.00)
MONOCYTE %: 6 % (ref 5–13)
MPV: 6.9 fL — ABNORMAL LOW (ref 7.9–10.8)
NEUTROPHIL #: 9.5 10*3/uL — ABNORMAL HIGH (ref 1.85–7.80)
NEUTROPHIL %: 79 % — ABNORMAL HIGH (ref 43–77)
PLATELETS: 270 10*3/uL (ref 140–440)
RBC: 3.49 10*6/uL — ABNORMAL LOW (ref 3.63–4.92)
RDW: 21.6 % — ABNORMAL HIGH (ref 12.3–17.7)
WBC: 12 10*3/uL — ABNORMAL HIGH (ref 3.8–11.8)

## 2022-01-16 LAB — COMPREHENSIVE METABOLIC PANEL, NON-FASTING
ALBUMIN/GLOBULIN RATIO: 1.2 (ref 0.8–1.4)
ALBUMIN: 4.5 g/dL (ref 3.5–5.7)
ALKALINE PHOSPHATASE: 85 U/L (ref 34–104)
ALT (SGPT): 8 U/L (ref 7–52)
ANION GAP: 15 mmol/L — ABNORMAL HIGH (ref 4–13)
AST (SGOT): 15 U/L (ref 13–39)
BILIRUBIN TOTAL: 0.6 mg/dL (ref 0.3–1.2)
BUN/CREA RATIO: 4 — ABNORMAL LOW (ref 6–22)
BUN: 40 mg/dL — ABNORMAL HIGH (ref 7–25)
CALCIUM, CORRECTED: 9.1 mg/dL (ref 8.9–10.8)
CALCIUM: 9.5 mg/dL (ref 8.6–10.3)
CHLORIDE: 93 mmol/L — ABNORMAL LOW (ref 98–107)
CO2 TOTAL: 26 mmol/L (ref 21–31)
CREATININE: 9.59 mg/dL — ABNORMAL HIGH (ref 0.60–1.30)
ESTIMATED GFR: 5 mL/min/{1.73_m2} — ABNORMAL LOW (ref >59)
GLOBULIN: 3.8 (ref 2.9–5.4)
GLUCOSE: 82 mg/dL (ref 74–109)
OSMOLALITY, CALCULATED: 277 mOsm/kg (ref 270–290)
POTASSIUM: 4.8 mmol/L (ref 3.5–5.1)
PROTEIN TOTAL: 8.3 g/dL (ref 6.4–8.9)
SODIUM: 134 mmol/L — ABNORMAL LOW (ref 136–145)

## 2022-01-16 LAB — SALICYLATE ACID LEVEL: SALICYLATE LEVEL: <3 mg/dL (ref <=30)

## 2022-01-16 MED ORDER — ACETAMINOPHEN 325 MG TABLET
650.0000 mg | ORAL_TABLET | ORAL | Status: DC
Start: 2022-01-16 — End: 2022-01-16
  Administered 2022-01-16: 0 mg via ORAL

## 2022-01-16 MED ORDER — ACETAMINOPHEN 325 MG TABLET
ORAL_TABLET | ORAL | Status: AC
Start: 2022-01-16 — End: 2022-01-16
  Filled 2022-01-16: qty 2

## 2022-01-16 NOTE — ED Triage Notes (Signed)
Brought to ED via [facility] with c/o chest pain since yesterday. Pt took 8 aspirin and NTG x4 PTA.    02 @ 3L/min per home use

## 2022-01-16 NOTE — ED Nurses Note (Signed)
PT to ED 20 with 9/10 chest pain. Pt is hypertensive at baseline. Pt states the last nitro she took was apx 30-40 mins ago. Pt does not recall what time she started to take nitro nor aspirin. Pt complains of headache related to nitro.PT takes Plavix. Pt's last dialysis was on Tues. Pt's fistula does not bruit/trill pt has a dialysis port on upper right chest.

## 2022-01-16 NOTE — ED Nurses Note (Signed)
Oriented times four. Discharge instructions reviewed with patient, and patient verbalizes understanding of all information provided. Peripheral IV discontinued with catheter intact and pressure dressing applied. Tolerated well. Site WNL. Patient left this facility ambulatory and in care of self. Care relinquished at this time.

## 2022-01-16 NOTE — ED Provider Notes (Signed)
[facility]  ED Primary Provider Note  History of Present Illness   Chief Complaint   Patient presents with    Chest Pain      [name] is a 46 y.o. female who had concerns including Chest Pain .  Arrival: The patient arrived by Ambulance    46 year old female presents by EMS emergency department complaining of chest pain shortness O breath since yesterday.  Patient has had multiple visits to the emergency department for similar complaints over last several months.  She is end-stage renal disease on dialysis.  Patient typically has dialysis on Mondays Wednesdays and Fridays but had dialysis on Tuesday due to the holiday so it has been 2 days since her last dialysis treatment.  Patient states she started having sharp aching pain yesterday evening.  Pain is worse with movement.  She was taking a total of 8 aspirin and 4 nitroglycerin for this without any changes in her chest pain.  Also reports mild shortness a breath.  Patient is O2 dependent.  Denies any fever, chills, nausea or vomiting      History Reviewed This Encounter: Medical History  Surgical History  Family History  Social History    Physical Exam   ED Triage Vitals [01/16/22 0554]   BP (Non-Invasive) (!) 187/117   Heart Rate 81   Respiratory Rate 18   Temperature 36.1 C (96.9 F)   SpO2 100 %   Weight 86.2 kg (190 lb)   Height 1.702 m (5' 7")     Physical Exam  Vitals reviewed.   Constitutional:       General: She is not in acute distress.     Appearance: Normal appearance. She is well-developed.   HENT:      Head: Normocephalic and atraumatic.   Cardiovascular:      Rate and Rhythm: Normal rate and regular rhythm.      Heart sounds: No murmur heard.  Pulmonary:      Effort: Pulmonary effort is normal. No respiratory distress.      Breath sounds: Normal breath sounds.   Abdominal:      Palpations: Abdomen is soft.      Tenderness: There is no abdominal tenderness.   Musculoskeletal:         General: Tenderness  (over anterior chest wall and thoracic back) present. No swelling.   Skin:     General: Skin is warm and dry.      Capillary Refill: Capillary refill takes less than 2 seconds.   Neurological:      Mental Status: She is alert.   Psychiatric:         Mood and Affect: Mood normal.       Patient Data     Labs Ordered/Reviewed   COMPREHENSIVE METABOLIC PANEL, NON-FASTING - Abnormal; Notable for the following components:       Result Value    SODIUM 134 (*)     CHLORIDE 93 (*)     ANION GAP 15 (*)     BUN 40 (*)     CREATININE 9.59 (*)     BUN/CREA RATIO 4 (*)     ESTIMATED GFR 5 (*)     All other components within normal limits    Narrative:     Estimated Glomerular Filtration Rate (eGFR) is calculated using the CKD-EPI (2021) equation, intended for patients 64 years of age and older. If gender is not documented or "unknown", there will be no eGFR calculation.  TROPONIN-I - Abnormal; Notable for the following components:    TROPONIN I 67 (*)     All other components within normal limits   TROPONIN-I - Abnormal; Notable for the following components:    TROPONIN I 69 (*)     All other components within normal limits   CBC WITH DIFF - Abnormal; Notable for the following components:    WBC 12.0 (*)     RBC 3.49 (*)     HGB 9.9 (*)     MCHC 31.5 (*)     RDW 21.6 (*)     MPV 6.9 (*)     NEUTROPHIL % 79 (*)     LYMPHOCYTE % 8 (*)     BASOPHIL % 2 (*)     NEUTROPHIL # 9.50 (*)     BASOPHIL # 0.30 (*)     All other components within normal limits   SALICYLATE ACID LEVEL - Normal   CBC/DIFF    Narrative:     The following orders were created for panel order CBC/DIFF.  Procedure                               Abnormality         Status                     ---------                               -----------         ------                     CBC WITH EYCX[[id number]]                Abnormal            Final result                 Please view results for these tests on the individual orders.   TROPONIN-I     XR CHEST PA AND LATERAL    Final Result by Edi, Rad[facility] In (11/23 0726)   Mild pulmonary vascular congestion. Mild pulmonary edema.         Radiologist location ID: [facility]        Medical Decision Making  Patient had presented for chest pain and shortness a breath.  She has history of similar presentations in the past as well.  She is end-stage renal disease.  She is 2 days from her last dialysis treatment.  Chest pain was reproducible on palpation.  EKG showed normal sinus rhythm without ST segment changes.  Troponins are elevated with 1st 1 being 67 and repeat being 69.  This is much lower the patient's most recent troponins.  Her BUN and creatinine are elevated but about mid range for her.  Potassium is normal.  Given the fact that labs are at baseline and chest pain was reproducible on palpation I do not feel that this is a cardiac etiology and more musculoskeletal.  I did consult with Dr. Liliane Shi nephrologist on-call.  As patient's labs were at baseline he feels okay with her being discharged home unless her shortness a breath is severe enough that she wants to stay to be dialyzed today if not she can  have dialysis tomorrow.  Patient is wanting to be discharged home at this time.      Amount and/or Complexity of Data Reviewed  Labs: ordered.  Radiology: ordered.    Risk  OTC drugs.      ED Course as of 01/16/22 0907   Thu Jan 16, 2022   0845 Spoke with Dr. Liliane Shi. He is ok with patient being discharged home as her labs are at her baseline.          Medications Administered in the ED   acetaminophen (TYLENOL) tablet (0 mg Oral Not Given 01/16/22 0649)     Clinical Impression   Chest pain, unspecified type (Primary)   ESRD on dialysis (CMS [facility])       Disposition: Discharged

## 2022-01-16 NOTE — Discharge Instructions (Signed)
Drink plenty of fluids. Continue any at home medications as previously prescribed. Discuss any questions you may have concerning your medications with your pharmacist. Follow up with your regular PCP in the next 2-3 days. Return to the ED if symptoms worsen, change, or do not improve.

## 2022-01-17 ENCOUNTER — Emergency Department (HOSPITAL_BASED_OUTPATIENT_CLINIC_OR_DEPARTMENT_OTHER): Payer: Medicare Other

## 2022-01-17 ENCOUNTER — Emergency Department
Admission: EM | Admit: 2022-01-17 | Discharge: 2022-01-17 | Disposition: A | Discharge status: Other Acute Inpatient Hospital | Payer: Medicare Other | Attending: FAMILY PRACTICE | Admitting: FAMILY PRACTICE

## 2022-01-17 ENCOUNTER — Encounter (HOSPITAL_BASED_OUTPATIENT_CLINIC_OR_DEPARTMENT_OTHER): Payer: Self-pay

## 2022-01-17 DIAGNOSIS — I161 Hypertensive emergency: Secondary | ICD-10-CM | POA: Insufficient documentation

## 2022-01-17 DIAGNOSIS — F17210 Nicotine dependence, cigarettes, uncomplicated: Secondary | ICD-10-CM | POA: Insufficient documentation

## 2022-01-17 DIAGNOSIS — R079 Chest pain, unspecified: Secondary | ICD-10-CM | POA: Insufficient documentation

## 2022-01-17 DIAGNOSIS — N186 End stage renal disease: Secondary | ICD-10-CM | POA: Insufficient documentation

## 2022-01-17 DIAGNOSIS — R7989 Other specified abnormal findings of blood chemistry: Secondary | ICD-10-CM | POA: Insufficient documentation

## 2022-01-17 DIAGNOSIS — I10 Essential (primary) hypertension: Secondary | ICD-10-CM

## 2022-01-17 DIAGNOSIS — R9431 Abnormal electrocardiogram [ECG] [EKG]: Secondary | ICD-10-CM

## 2022-01-17 DIAGNOSIS — K219 Gastro-esophageal reflux disease without esophagitis: Secondary | ICD-10-CM | POA: Insufficient documentation

## 2022-01-17 DIAGNOSIS — R0789 Other chest pain: Secondary | ICD-10-CM

## 2022-01-17 DIAGNOSIS — I120 Hypertensive chronic kidney disease with stage 5 chronic kidney disease or end stage renal disease: Secondary | ICD-10-CM | POA: Insufficient documentation

## 2022-01-17 DIAGNOSIS — I5032 Chronic diastolic (congestive) heart failure: Secondary | ICD-10-CM | POA: Insufficient documentation

## 2022-01-17 DIAGNOSIS — I440 Atrioventricular block, first degree: Secondary | ICD-10-CM

## 2022-01-17 DIAGNOSIS — Z992 Dependence on renal dialysis: Secondary | ICD-10-CM | POA: Insufficient documentation

## 2022-01-17 DIAGNOSIS — R519 Headache, unspecified: Secondary | ICD-10-CM | POA: Insufficient documentation

## 2022-01-17 LAB — COMPREHENSIVE METABOLIC PANEL, NON-FASTING
ALBUMIN/GLOBULIN RATIO: 0.7 — ABNORMAL LOW (ref 0.8–1.4)
ALBUMIN: 3.4 g/dL (ref 3.4–5.0)
ALKALINE PHOSPHATASE: 106 U/L (ref 46–116)
ALT (SGPT): 10 U/L (ref <=78)
ANION GAP: 17 mmol/L — ABNORMAL HIGH (ref 4–13)
AST (SGOT): 24 U/L (ref 15–37)
BILIRUBIN TOTAL: 0.6 mg/dL (ref 0.2–1.0)
BUN/CREA RATIO: 4
BUN: 51 mg/dL — ABNORMAL HIGH (ref 7–18)
CALCIUM, CORRECTED: 9.9 mg/dL
CALCIUM: 9.4 mg/dL (ref 8.5–10.1)
CHLORIDE: 93 mmol/L — ABNORMAL LOW (ref 98–107)
CO2 TOTAL: 20 mmol/L — ABNORMAL LOW (ref 21–32)
CREATININE: 11.92 mg/dL — ABNORMAL HIGH (ref 0.55–1.02)
ESTIMATED GFR: 4 mL/min/{1.73_m2} — ABNORMAL LOW (ref >59)
GLOBULIN: 5
GLUCOSE: 101 mg/dL (ref 74–106)
OSMOLALITY, CALCULATED: 275 mOsm/kg (ref 270–290)
POTASSIUM: 4.8 mmol/L (ref 3.5–5.1)
PROTEIN TOTAL: 8.4 g/dL — ABNORMAL HIGH (ref 6.4–8.2)
SODIUM: 130 mmol/L — ABNORMAL LOW (ref 136–145)

## 2022-01-17 LAB — CBC WITH DIFF
BASOPHIL #: 0.04 10*3/uL (ref 0.00–0.30)
BASOPHIL %: 0 % (ref 0–3)
EOSINOPHIL #: 0.25 10*3/uL (ref 0.00–0.80)
EOSINOPHIL %: 2 % (ref 0–7)
HCT: 34 % — ABNORMAL LOW (ref 37.0–47.0)
HGB: 10.7 g/dL — ABNORMAL LOW (ref 12.5–16.0)
LYMPHOCYTE #: 0.64 10*3/uL — ABNORMAL LOW (ref 1.10–5.00)
LYMPHOCYTE %: 5 % — ABNORMAL LOW (ref 25–45)
MCH: 28.9 pg (ref 27.0–32.0)
MCHC: 31.4 g/dL — ABNORMAL LOW (ref 32.0–36.0)
MCV: 92.1 fL (ref 78.0–99.0)
MONOCYTE #: 0.56 10*3/uL (ref 0.00–1.30)
MONOCYTE %: 4 % (ref 0–12)
MPV: 8.6 fL (ref 7.4–10.4)
NEUTROPHIL #: 11.61 10*3/uL — ABNORMAL HIGH (ref 1.80–8.40)
NEUTROPHIL %: 89 % — ABNORMAL HIGH (ref 40–76)
PLATELETS: 267 10*3/uL (ref 140–440)
RBC: 3.69 10*6/uL — ABNORMAL LOW (ref 4.20–5.40)
RDW: 22.1 % — ABNORMAL HIGH (ref 11.6–14.8)
WBC: 13.1 10*3/uL — ABNORMAL HIGH (ref 4.0–10.5)

## 2022-01-17 LAB — PT/INR
INR: 1.09 (ref 0.88–1.10)
PROTHROMBIN TIME: 12.6 seconds (ref 9.8–12.7)

## 2022-01-17 LAB — ECG 12 LEAD
Atrial Rate: 78 {beats}/min
Atrial Rate: 71 {beats}/min
Calculated P Axis: 90 degrees
Calculated P Axis: 74 degrees
Calculated R Axis: 78 degrees
Calculated R Axis: 16 degrees
Calculated T Axis: 85 degrees
Calculated T Axis: 96 degrees
PR Interval: 220 ms
PR Interval: 218 ms
QRS Duration: 106 ms
QRS Duration: 100 ms
QT Interval: 450 ms
QT Interval: 446 ms
QTC Calculation: 513 ms
QTC Calculation: 484 ms
Ventricular rate: 78 {beats}/min
Ventricular rate: 71 {beats}/min

## 2022-01-17 LAB — NT-PROBNP: NT-PROBNP: >35000 pg/mL — ABNORMAL HIGH (ref <=125)

## 2022-01-17 LAB — TROPONIN-I
TROPONIN I: 54 ng/L — ABNORMAL HIGH (ref <15)
TROPONIN I: 70 ng/L — ABNORMAL HIGH (ref <15)
TROPONIN I: 101 ng/L — ABNORMAL HIGH (ref <15)
TROPONIN I: 198 ng/L — ABNORMAL HIGH (ref <15)

## 2022-01-17 MED ORDER — HYDRALAZINE 20 MG/ML INJECTION SOLUTION
20.0000 mg | INTRAMUSCULAR | Status: AC
Start: 2022-01-17 — End: 2022-01-17
  Administered 2022-01-17: 20 mg via INTRAVENOUS

## 2022-01-17 MED ORDER — CLONIDINE HCL 0.1 MG TABLET
ORAL_TABLET | ORAL | Status: AC
Start: 2022-01-17 — End: 2022-01-17
  Filled 2022-01-17: qty 1

## 2022-01-17 MED ORDER — HYDRALAZINE 20 MG/ML INJECTION SOLUTION
INTRAMUSCULAR | Status: AC
Start: 2022-01-17 — End: 2022-01-17
  Filled 2022-01-17: qty 1

## 2022-01-17 MED ORDER — PROCHLORPERAZINE EDISYLATE 10 MG/2 ML (5 MG/ML) INJECTION SOLUTION
10.0000 mg | INTRAMUSCULAR | Status: AC
Start: 2022-01-17 — End: 2022-01-17
  Administered 2022-01-17: 10 mg via INTRAVENOUS

## 2022-01-17 MED ORDER — ASPIRIN 81 MG CHEWABLE TABLET
CHEWABLE_TABLET | ORAL | Status: AC
Start: 2022-01-17 — End: 2022-01-17
  Filled 2022-01-17: qty 4

## 2022-01-17 MED ORDER — MORPHINE 4 MG/ML INJECTION WRAPPER
4.0000 mg | INJECTION | INTRAMUSCULAR | Status: AC
Start: 2022-01-17 — End: 2022-01-17
  Administered 2022-01-17: 4 mg via INTRAVENOUS

## 2022-01-17 MED ORDER — HYDROMORPHONE 2 MG/ML INJECTION WRAPPER
1.0000 mg | INJECTION | INTRAMUSCULAR | Status: AC
Start: 2022-01-17 — End: 2022-01-17
  Administered 2022-01-17: 1 mg via INTRAVENOUS

## 2022-01-17 MED ORDER — CLONIDINE HCL 0.2 MG TABLET
0.3000 mg | ORAL_TABLET | ORAL | Status: AC
Start: 2022-01-17 — End: 2022-01-17
  Administered 2022-01-17: 0.3 mg via ORAL

## 2022-01-17 MED ORDER — ASPIRIN 81 MG CHEWABLE TABLET
324.0000 mg | CHEWABLE_TABLET | ORAL | Status: DC
Start: 2022-01-17 — End: 2022-01-17

## 2022-01-17 MED ORDER — HYDROMORPHONE 2 MG/ML INJECTION WRAPPER
INJECTION | INTRAMUSCULAR | Status: AC
Start: 2022-01-17 — End: 2022-01-17
  Filled 2022-01-17: qty 1

## 2022-01-17 MED ORDER — MORPHINE 4 MG/ML INJECTION WRAPPER
INJECTION | INTRAMUSCULAR | Status: AC
Start: 2022-01-17 — End: 2022-01-17
  Filled 2022-01-17: qty 1

## 2022-01-17 MED ORDER — NITROGLYCERIN 2 % TRANSDERMAL OINTMENT - PACKET
TOPICAL_OINTMENT | TRANSDERMAL | Status: AC
Start: 2022-01-17 — End: 2022-01-17
  Filled 2022-01-17: qty 1

## 2022-01-17 MED ORDER — FUROSEMIDE 10 MG/ML INJECTION SOLUTION
INTRAMUSCULAR | Status: AC
Start: 2022-01-17 — End: 2022-01-17
  Filled 2022-01-17: qty 10

## 2022-01-17 MED ORDER — CLONIDINE HCL 0.2 MG TABLET
ORAL_TABLET | ORAL | Status: AC
Start: 2022-01-17 — End: 2022-01-17
  Filled 2022-01-17: qty 1

## 2022-01-17 MED ORDER — NITROGLYCERIN 2 % TRANSDERMAL OINTMENT - PACKET
1.0000 [in_us] | TOPICAL_OINTMENT | TRANSDERMAL | Status: AC
Start: 2022-01-17 — End: 2022-01-17
  Administered 2022-01-17: 1 [in_us] via TOPICAL

## 2022-01-17 MED ORDER — PANTOPRAZOLE 40 MG INTRAVENOUS SOLUTION
INTRAVENOUS | Status: AC
Start: 2022-01-17 — End: 2022-01-17
  Filled 2022-01-17: qty 10

## 2022-01-17 MED ORDER — PROCHLORPERAZINE EDISYLATE 10 MG/2 ML (5 MG/ML) INJECTION SOLUTION
INTRAMUSCULAR | Status: AC
Start: 2022-01-17 — End: 2022-01-17
  Filled 2022-01-17: qty 2

## 2022-01-17 MED ORDER — ASPIRIN 81 MG CHEWABLE TABLET
324.0000 mg | CHEWABLE_TABLET | ORAL | Status: AC
Start: 2022-01-17 — End: 2022-01-17
  Administered 2022-01-17: 324 mg via ORAL

## 2022-01-17 MED ORDER — FUROSEMIDE 10 MG/ML INJECTION SOLUTION
100.0000 mg | INTRAMUSCULAR | Status: AC
Start: 2022-01-17 — End: 2022-01-17
  Administered 2022-01-17: 100 mg via INTRAVENOUS

## 2022-01-17 MED ORDER — PANTOPRAZOLE 40 MG INTRAVENOUS SOLUTION
40.0000 mg | INTRAVENOUS | Status: AC
Start: 2022-01-17 — End: 2022-01-17
  Administered 2022-01-17: 40 mg via INTRAVENOUS

## 2022-01-17 NOTE — ED Nurses Note (Signed)
Chest pain decreased to 4/10 No nausea. Taking ice chips . Refuses food at this time. States, "I haven't been able to eat  . When I do it feels like it's stuck in the center of my chest. " Cardiac monitor shows SR without ectopy.

## 2022-01-17 NOTE — ED Nurses Note (Signed)
Assisted pt to bedside toilet.

## 2022-01-17 NOTE — ED Nurses Note (Signed)
Pain decreased to 9/10 Cardiac monitor shows SR without ectopy. 02 sat 96% respirations even and unlabored. B/P 166/113

## 2022-01-17 NOTE — ED Nurses Note (Signed)
B/P 179/152 new order noted.

## 2022-01-17 NOTE — ED Nurses Note (Signed)
Pt c/o's sharp pain then pressure in center of chest radiating to back and left arm. 10/10 B/P 194/119 Dr. Rise Patience informed. New order noted.

## 2022-01-17 NOTE — ED Attending Handoff Note (Signed)
MDM:  This 46 year old female patient presents emergency room with chest pain again overnight with high blood pressure and headache.  Her troponins were in the 50-70 range, and she runs anywhere from 40-65 in her troponins over the past several visits for chest pain.  This morning, her troponin was 71, and 101 most recently, she is not complaining of chest pain pressure heaviness currently your blood pressure is slightly improved over her presentation blood pressure.  She has no nausea, cough, wheeze.  Her lungs are clear symmetrical with fair to good aeration, heart regular rate rhythm without murmur gallop, abdomen is soft nontender, extremities have trace dependent edema.    Impressions elevated troponin with chest pain, uncontrolled hypertension and renovascular hypertension.  Will check another troponin and EKG, disposition pending that troponin and EKG.    At 1:45 p.m. patient was discussed with cardiology at [facility], since no beds are available at [facility], patient accepted for transfer to the service of Dr. Henrine Screws.    ED Course as of 01/17/22 1350   Fri Jan 17, 2022   1054 Patient seen evaluated, still complaining of her blood pressure being up and some headache, and occasionally some intermittent pain deep in her chest.  She is had no diaphoresis, dyspnea, nausea or vomiting currently.  She does have improvement in her headache with the Catapres and hydralazine given in the ER earlier.  Troponin I is up, will check 1 more troponin I   1121 Repeat EKG at 11:01 a.m. read at 11:04 a.m.: Sinus first-degree heart block at 71 with LVH and strain.  Long QT interval no STEMI   1349 Patient discussed with Dr.Okogbue, cardiology at [facility] and will be accepted to [facility], will call us back with details for bed       Discharged  Clinical Impression   Chest pain, unspecified type (Primary)   Gastroesophageal reflux disease, unspecified whether esophagitis present   Renal failure      Medications Administered in the ED   nitroGLYCERIN (NITRO-BID) 2 % topical ointment (1 Inch Apply Topically Given 01/17/22 0516)   hydrALAZINE (APRESOLINE) injection 20 mg (20 mg Intravenous Given 01/17/22 0516)   prochlorperazine (COMPAZINE) 5 mg/mL injection (10 mg Intravenous Given 01/17/22 0516)   HYDROmorphone (DILAUDID) 2 mg/mL injection (1 mg Intravenous Given 01/17/22 0517)   pantoprazole (PROTONIX) injection (40 mg Intravenous Given 01/17/22 0516)   hydrALAZINE (APRESOLINE) injection 20 mg (20 mg Intravenous Given 01/17/22 0621)   furosemide (LASIX) 10 mg/mL injection (100 mg Intravenous Given 01/17/22 0744)   cloNIDine HCL (CATAPRES) tablet 0.3 mg (0.3 mg Oral Given 01/17/22 0736)   morphine 4 mg/mL injection (4 mg Intravenous Given 01/17/22 0737)   HYDROmorphone (DILAUDID) 2 mg/mL injection (1 mg Intravenous Given 01/17/22 0926)        Current Discharge Medication List        CONTINUE these medications - NO CHANGES were made during your visit.        Details   acetaminophen 325 mg Tablet  Commonly known as: TYLENOL   650 mg, Oral, EVERY 4 HOURS PRN  Refills: 0     amLODIPine 10 mg Tablet  Commonly known as: NORVASC   10 mg, Oral, DAILY, Take one tablet every day  Refills: 0     budesonide-formoteroL 160-4.5 mcg/actuation oral inhaler  Commonly known as: SYMBICORT   2 Puffs, Inhalation, 2 TIMES DAILY  Refills: 0     calcitrioL 0.5 mcg Capsule  Commonly known as:  ROCALTROL   1 mcg, Oral, EVERY MO, WE AND FR  Refills: 0     calcium acetate(phosphat bind) 667 mg Capsule  Commonly known as: PHOSLO   1,334 mg, Oral, 3 TIMES DAILY WITH MEALS  Qty: 180 Capsule  Refills: 0     cinacalcet 30 mg Tablet  Commonly known as: SENSIPAR   30 mg, Oral, EVERY EVENING  Refills: 0     cloNIDine 0.3 mg/24 hr Patch Weekly  Commonly known as: CATAPRES-TTS   0.3 mg, Transdermal, EVERY 7 DAYS  Refills: 0     cloNIDine HCL 0.2 mg Tablet  Commonly known as: CATAPRES   0.2 mg, Oral, 2 TIMES DAILY, Take one tablet twice a  day  Refills: 0     clopidogreL 75 mg Tablet  Commonly known as: PLAVIX   75 mg, Oral, DAILY  Refills: 0     cyclobenzaprine 5 mg Tablet  Commonly known as: FLEXERIL   5 mg, Oral, 2 TIMES DAILY PRN  Refills: 0     doxazosin 4 mg Tablet  Commonly known as: CARDURA   4 mg, Oral, EVERY EVENING  Refills: 0     famotidine 40 mg Tablet  Commonly known as: PEPCID   40 mg, Oral, DAILY  Refills: 0     furosemide 80 mg Tablet  Commonly known as: LASIX   160 mg, Oral, DAILY, Take after dialysis on dialysis days; otherwise take in morning daily.   Refills: 0     hydrALAZINE 100 mg Tablet  Commonly known as: APRESOLINE   100 mg, Oral, EVERY 8 HOURS (SCHEDULED)  Qty: 90 Tablet  Refills: 0     ipratropium-albuteroL 0.5 mg-3 mg(2.5 mg base)/3 mL nebulizer solution  Commonly known as: DUONEB   3 mL, Nebulization, 4 TIMES DAILY PRN  Refills: 0     Isosorbide Mononitrate 120 mg Tablet Sustained Release 24 hr  Commonly known as: IMDUR   120 mg, Oral, EVERY MORNING  Qty: 30 Tablet  Refills: 0     labetaloL 200 mg Tablet  Commonly known as: NORMODYNE   400 mg, Oral, 3 TIMES DAILY  Refills: 0     losartan 100 mg Tablet  Commonly known as: COZAAR   100 mg, Oral, DAILY  Refills: 0     montelukast 10 mg Tablet  Commonly known as: SINGULAIR   10 mg, Oral, EVERY EVENING  Refills: 0     nitroGLYCERIN 0.4 mg Tablet, Sublingual  Commonly known as: NITROSTAT   0.4 mg, Sublingual, EVERY 5 MIN PRN, for 3 doses over 15 minutes  Qty: 3 Tablet  Refills: 0     omeprazole 40 mg Capsule, Delayed Release(E.C.)  Commonly known as: PRILOSEC   40 mg, Oral, DAILY  Refills: 0     ondansetron 4 mg Tablet, Rapid Dissolve  Commonly known as: ZOFRAN ODT   4 mg, Oral, EVERY 8 HOURS PRN  Qty: 12 Tablet  Refills: 0     pantoprazole 40 mg Tablet, Delayed Release (E.C.)  Commonly known as: PROTONIX   40 mg, Oral, DAILY  Refills: 0     rosuvastatin 40 mg Tablet  Commonly known as: CRESTOR   40 mg, Oral, EVERY EVENING, Take one tablet every day  Refills: 0     traMADoL 50  mg Tablet  Commonly known as: ULTRAM   50 mg, Oral, EVERY 6 HOURS PRN  Qty: 20 Tablet  Refills: 0

## 2022-01-17 NOTE — ED Nurses Note (Signed)
Dr. Riki Sheer in. Pt con't to c/o chest pain 6/10 . New order noted.

## 2022-01-17 NOTE — ED Attending Note (Addendum)
[place] emergency department         HISTORY OF PRESENT ILLNESS     Date:  01/17/2022  Patient's Name:  [name]  Date of Birth:  January 19, 1976    Patient is a 46 year old female with multiple ER visits related to chest pain chronic renal failure and elevated blood pressure.  Patient was seen earlier [facility] for chest pain.  She was discharged home after discussing patient's care with Nephrology.  Patient states at home she developed chest tightness again with shortness of breath blood pressure was found to be elevated with systolic blood pressure greater than 200.  Patient states she took sublingual nitro at home and aspirin without relief.  Patient was also treated by EMS with morphine and Zofran.  Patient usually on hemodialysis Monday Wednesday Friday was dialyzed on Tuesday and due for dialysis today Friday.  She was dialyzed on Tuesday due to the Thanksgiving holiday        Review of Systems     Review of Systems   Constitutional: Negative.    Respiratory:  Positive for chest tightness and shortness of breath.    Cardiovascular:  Positive for chest pain.   Gastrointestinal:  Positive for abdominal pain, nausea and vomiting.   Musculoskeletal: Negative.    Neurological: Negative.    All other systems reviewed and are negative.      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    Tricuspid valve regurgitation     Uncontrolled hypertension        Past Surgical History:  Past Surgical  History:   Procedure Laterality Date    Coronary artery angioplasty      Esophagogastroduodenoscopy      Hx back surgery      Hx cholecystectomy      Hx foot surgery Right     Hx hysterectomy      Hx tonsillectomy         Social History:  Social History     Tobacco Use    Smoking status: Every Day     Packs/day: 1.00     Years: 10.00     Additional pack years: 0.00     Total pack years: 10.00     Types: Cigarettes     Passive exposure: Past    Smokeless tobacco: Former    Tobacco comments:     "Haven't had a cigerette in 2 weeks."   Vaping Use    Vaping Use: Never used   Substance Use Topics    Alcohol use: Not Currently    Drug use: Yes     Types: Marijuana     Social History     Substance and Sexual Activity   Drug Use Yes    Types: Marijuana       Family History:  Family History   Problem Relation Age of Onset    Diabetes type II Mother     Coronary Artery Disease Mother     Breast Cancer Mother     Hypertension (High Blood Pressure) Father        Medication History:  Current Outpatient Medications   Medication Sig    acetaminophen (TYLENOL) 325 mg Oral Tablet Take 2 Tablets (650 mg total) by mouth Every 4 hours as needed    amLODIPine (NORVASC) 10 mg Oral Tablet Take 1 Tablet (10 mg total) by mouth Once a day Take one tablet every day    budesonide-formoteroL (SYMBICORT) 160-4.5 mcg/actuation Inhalation oral inhaler Take 2 Puffs by inhalation Twice daily    calcitrioL (ROCALTROL) 0.5 mcg Oral Capsule Take 2 Capsules (1 mcg total) by mouth Every Monday, Wednesday and Friday    calcium acetate,phosphat bind, (PHOSLO) 667 mg Oral Capsule Take 2 Capsules (1,334 mg total) by mouth Three times daily with meals for 30 days    cinacalcet (SENSIPAR) 30 mg Oral Tablet Take 1 Tablet (30 mg total) by mouth Every evening    cloNIDine (CATAPRES-TTS) 0.3 mg/24 hr Transdermal Patch Weekly Place 1 Patch (0.3 mg total) on the skin Every 7 days    cloNIDine HCL (CATAPRES) 0.2 mg Oral Tablet Take 1 Tablet (0.2 mg total) by mouth  Twice daily Take one tablet twice a day    clopidogreL (PLAVIX) 75 mg Oral Tablet Take 1 Tablet (75 mg total) by mouth Once a day    cyclobenzaprine (FLEXERIL) 5 mg Oral Tablet Take 1 Tablet (5 mg total) by mouth Twice per day as needed for Muscle spasms    doxazosin (CARDURA) 4 mg Oral Tablet Take 1 Tablet (4 mg total) by mouth Every evening    famotidine (PEPCID) 40 mg Oral Tablet Take 1 Tablet (40 mg total) by mouth Once a day    furosemide (LASIX) 80 mg Oral Tablet Take 2 Tablets (160 mg total) by mouth Once a day Take after dialysis on dialysis days; otherwise take in morning daily.    hydrALAZINE (APRESOLINE) 100 mg Oral Tablet Take 1 Tablet (100 mg total) by mouth Every 8 hours for 30 days    ipratropium-albuterol 0.5 mg-3 mg(2.5 mg base)/3 mL Solution for Nebulization Take 3 mL by nebulization Four times a day as needed    Isosorbide Mononitrate (IMDUR) 120 mg Oral Tablet Sustained Release 24 hr Take 1 Tablet (120 mg total) by mouth Every morning for 30 days    labetaloL (NORMODYNE) 200 mg Oral Tablet Take 2 Tablets (400 mg total) by mouth Three times a day    losartan (COZAAR) 100 mg Oral Tablet Take 1 Tablet (100 mg total) by mouth Once a day    montelukast (SINGULAIR) 10 mg Oral Tablet Take 1 Tablet (10 mg total) by mouth Every evening    nitroGLYCERIN (NITROSTAT) 0.4 mg Sublingual Tablet, Sublingual Place 1 Tablet (0.4 mg total) under the tongue Every 5 minutes as needed for Chest pain for up to 3 doses for 3 doses over 15 minutes    omeprazole (PRILOSEC) 40 mg Oral Capsule, Delayed Release(E.C.) Take 1 Capsule (40 mg total) by mouth Once a day    ondansetron (ZOFRAN ODT) 4 mg Oral Tablet, Rapid Dissolve Take 1 Tablet (4 mg total) by mouth Every 8 hours as needed for Nausea/Vomiting    pantoprazole (PROTONIX) 40 mg Oral Tablet, Delayed Release (E.C.) Take 1 Tablet (40 mg total) by mouth Once a day  rosuvastatin (CRESTOR) 40 mg Oral Tablet Take 1 Tablet (40 mg total) by mouth Every evening Take one  tablet every day    traMADoL (ULTRAM) 50 mg Oral Tablet Take 1 Tablet (50 mg total) by mouth Every 6 hours as needed for Pain       Allergies:  Allergies   Allergen Reactions    Lisinopril Swelling     Tongue and throat swelling    Cardene [Nicardipine]  Other Adverse Reaction (Add comment)     Chest pain     Oxycodone Itching       Physical Exam     Vitals:    BP (!) 187/113   Pulse 80   Temp 36.4 C (97.5 F)   Resp 18   Ht 1.702 m (5\' 7" )   Wt 86.2 kg (190 lb)   SpO2 98%   BMI 29.76 kg/m           Physical Exam  Vitals and nursing note reviewed.   Constitutional:       General: She is not in acute distress.     Appearance: She is well-developed.   HENT:      Head: Normocephalic and atraumatic.      Right Ear: Tympanic membrane normal.      Left Ear: Tympanic membrane normal.      Mouth/Throat:      Mouth: Mucous membranes are moist.   Eyes:      Conjunctiva/sclera: Conjunctivae normal.   Cardiovascular:      Rate and Rhythm: Normal rate and regular rhythm.      Pulses: Normal pulses.      Heart sounds: No murmur heard.  Pulmonary:      Effort: Pulmonary effort is normal. No respiratory distress.      Breath sounds: Normal breath sounds.   Abdominal:      General: Bowel sounds are normal.      Palpations: Abdomen is soft.      Tenderness: There is abdominal tenderness.      Comments: Epigastric tenderness with guarding   Musculoskeletal:         General: No swelling. Normal range of motion.      Cervical back: Neck supple.   Skin:     General: Skin is warm and dry.      Capillary Refill: Capillary refill takes less than 2 seconds.   Neurological:      General: No focal deficit present.      Mental Status: She is alert and oriented to person, place, and time.   Psychiatric:         Mood and Affect: Mood normal.         Diagnostic Studies/Treatment     Medications:  Medications Administered in the ED   nitroGLYCERIN (NITRO-BID) 2 % topical ointment (1 Inch Apply Topically Given 01/17/22 0516)   hydrALAZINE  (APRESOLINE) injection 20 mg (20 mg Intravenous Given 01/17/22 0516)   prochlorperazine (COMPAZINE) 5 mg/mL injection (10 mg Intravenous Given 01/17/22 0516)   HYDROmorphone (DILAUDID) 2 mg/mL injection (1 mg Intravenous Given 01/17/22 0517)   pantoprazole (PROTONIX) injection (40 mg Intravenous Given 01/17/22 0516)       New Prescriptions    No medications on file       Labs:    No results found for this or any previous visit (from the past 12 hour(s)).     Radiology:  XR CHEST AP    XR CHEST AP    (Results Pending)  ECG:   Normal sinus rhythm first-degree block 78 beats per minute PR interval 220            Differential diagnosis    Noncardiac chest pain, chronic renal failure on hemodialysis, uncontrolled hypertension  Course/Disposition/Plan     Course:      Patient with elevated blood pressure history of congestive heart failure on hemodialysis.  Chest x-ray chronic lung disease.  Chest pain is atypical probably noncardiac  Disposition:    Data Unavailable    Condition at Disposition:     Stable  Follow up:   No follow-up provider specified.    Clinical Impression:     Clinical Impression   Chest pain, unspecified type (Primary)   Gastroesophageal reflux disease, unspecified whether esophagitis present   Renal failure         Winfred Burn, MD

## 2022-01-17 NOTE — ED Nurses Note (Signed)
Pt transferred to [facility] via [facility] EMS

## 2022-01-17 NOTE — ED Nurses Note (Signed)
B/P decreased 180/104 . Skin warm and dry. No distress noted.

## 2022-01-17 NOTE — ED Triage Notes (Signed)
Per EMS, patient c/o chest pain that is midsternal. EMS reports elevated BP of >200/>100. She took 2 NTG at home prior to EMS arrival. EMS gave her an additional NTG, 324 mg ASA, 4 mg morphine, and 10 mg labetalol prior to arrival. Patient reports minimal pain relief.

## 2022-01-17 NOTE — ED Nurses Note (Signed)
Pt admitted to [facility] room 782 Pt informed . Voices understanding.  Consent obtained. Dr. Henrine Screws. Report called to Sherrye Payor RN

## 2022-01-17 NOTE — ED Nurses Note (Addendum)
Pt c/o of substernal chest pain that radiates to L arm. Pt states pain began 3 days ago and has became worse since. Pt also c/o epigastric pain and nausea, no vomiting, that began around 0100. Respirations are even and unlabored at this time. Pt states she was also seen @ [facility] yesterday for same complaints. Slight swelling noted to bilateral lower extremities. Pt states that she is supposed to go to dialysis today. Pt states that nothing improves the pain. Pt placed on monitor.

## 2022-01-21 NOTE — Nursing Note (Signed)
 Discharge education including meds and follow ups completed with pt. All questions answered, written copy of instructions provided, verbalized understanding. IVs out.

## 2022-01-22 ENCOUNTER — Emergency Department
Admission: EM | Admit: 2022-01-22 | Discharge: 2022-01-22 | Disposition: A | Discharge status: Other Acute Inpatient Hospital | Payer: Medicare Other | Attending: Emergency Medicine | Admitting: Emergency Medicine

## 2022-01-22 ENCOUNTER — Encounter (HOSPITAL_BASED_OUTPATIENT_CLINIC_OR_DEPARTMENT_OTHER): Payer: Self-pay

## 2022-01-22 ENCOUNTER — Other Ambulatory Visit: Payer: Self-pay

## 2022-01-22 ENCOUNTER — Emergency Department (EMERGENCY_DEPARTMENT_HOSPITAL): Payer: Medicare Other

## 2022-01-22 DIAGNOSIS — I459 Conduction disorder, unspecified: Secondary | ICD-10-CM

## 2022-01-22 DIAGNOSIS — R079 Chest pain, unspecified: Secondary | ICD-10-CM

## 2022-01-22 DIAGNOSIS — I252 Old myocardial infarction: Secondary | ICD-10-CM | POA: Insufficient documentation

## 2022-01-22 DIAGNOSIS — N186 End stage renal disease: Secondary | ICD-10-CM | POA: Insufficient documentation

## 2022-01-22 DIAGNOSIS — Z95818 Presence of other cardiac implants and grafts: Secondary | ICD-10-CM

## 2022-01-22 DIAGNOSIS — I509 Heart failure, unspecified: Secondary | ICD-10-CM | POA: Insufficient documentation

## 2022-01-22 DIAGNOSIS — I214 Non-ST elevation (NSTEMI) myocardial infarction: Secondary | ICD-10-CM | POA: Insufficient documentation

## 2022-01-22 DIAGNOSIS — I440 Atrioventricular block, first degree: Secondary | ICD-10-CM | POA: Insufficient documentation

## 2022-01-22 DIAGNOSIS — R918 Other nonspecific abnormal finding of lung field: Secondary | ICD-10-CM | POA: Insufficient documentation

## 2022-01-22 DIAGNOSIS — I120 Hypertensive chronic kidney disease with stage 5 chronic kidney disease or end stage renal disease: Secondary | ICD-10-CM

## 2022-01-22 DIAGNOSIS — Z87891 Personal history of nicotine dependence: Secondary | ICD-10-CM | POA: Insufficient documentation

## 2022-01-22 DIAGNOSIS — K219 Gastro-esophageal reflux disease without esophagitis: Secondary | ICD-10-CM | POA: Insufficient documentation

## 2022-01-22 DIAGNOSIS — N185 Chronic kidney disease, stage 5: Secondary | ICD-10-CM

## 2022-01-22 DIAGNOSIS — I517 Cardiomegaly: Secondary | ICD-10-CM

## 2022-01-22 DIAGNOSIS — E7800 Pure hypercholesterolemia, unspecified: Secondary | ICD-10-CM | POA: Insufficient documentation

## 2022-01-22 DIAGNOSIS — I132 Hypertensive heart and chronic kidney disease with heart failure and with stage 5 chronic kidney disease, or end stage renal disease: Secondary | ICD-10-CM | POA: Insufficient documentation

## 2022-01-22 DIAGNOSIS — R9431 Abnormal electrocardiogram [ECG] [EKG]: Secondary | ICD-10-CM | POA: Insufficient documentation

## 2022-01-22 DIAGNOSIS — I21A1 Myocardial infarction type 2: Secondary | ICD-10-CM

## 2022-01-22 LAB — ECG 12 LEAD
Atrial Rate: 71 {beats}/min
Calculated P Axis: 74 degrees
Calculated R Axis: -42 degrees
Calculated T Axis: 90 degrees
PR Interval: 230 ms
QRS Duration: 108 ms
QT Interval: 474 ms
QTC Calculation: 515 ms
Ventricular rate: 71 {beats}/min

## 2022-01-22 LAB — COMPREHENSIVE METABOLIC PANEL, NON-FASTING
ALBUMIN/GLOBULIN RATIO: 0.8 (ref 0.8–1.4)
ALBUMIN: 3.6 g/dL (ref 3.4–5.0)
ALKALINE PHOSPHATASE: 103 U/L (ref 46–116)
ALT (SGPT): 9 U/L (ref <=78)
ANION GAP: 15 mmol/L — ABNORMAL HIGH (ref 4–13)
AST (SGOT): 25 U/L (ref 15–37)
BILIRUBIN TOTAL: 0.8 mg/dL (ref 0.2–1.0)
BUN/CREA RATIO: 4
BUN: 50 mg/dL — ABNORMAL HIGH (ref 7–18)
CALCIUM, CORRECTED: 9.4 mg/dL
CALCIUM: 9.1 mg/dL (ref 8.5–10.1)
CHLORIDE: 90 mmol/L — ABNORMAL LOW (ref 98–107)
CO2 TOTAL: 24 mmol/L (ref 21–32)
CREATININE: 13.08 mg/dL — ABNORMAL HIGH (ref 0.55–1.02)
ESTIMATED GFR: 3 mL/min/{1.73_m2} — ABNORMAL LOW (ref >59)
GLOBULIN: 4.8
GLUCOSE: 112 mg/dL — ABNORMAL HIGH (ref 74–106)
OSMOLALITY, CALCULATED: 273 mOsm/kg (ref 270–290)
POTASSIUM: 4.9 mmol/L (ref 3.5–5.1)
PROTEIN TOTAL: 8.4 g/dL — ABNORMAL HIGH (ref 6.4–8.2)
SODIUM: 129 mmol/L — ABNORMAL LOW (ref 136–145)

## 2022-01-22 LAB — CBC WITH DIFF
BASOPHIL #: 0.03 10*3/uL (ref 0.00–0.30)
BASOPHIL %: 0 % (ref 0–3)
EOSINOPHIL #: 0.28 10*3/uL (ref 0.00–0.80)
EOSINOPHIL %: 4 % (ref 0–7)
HCT: 30.5 % — ABNORMAL LOW (ref 37.0–47.0)
HGB: 9.5 g/dL — ABNORMAL LOW (ref 12.5–16.0)
LYMPHOCYTE #: 0.58 10*3/uL — ABNORMAL LOW (ref 1.10–5.00)
LYMPHOCYTE %: 7 % — ABNORMAL LOW (ref 25–45)
MCH: 28.2 pg (ref 27.0–32.0)
MCHC: 31.2 g/dL — ABNORMAL LOW (ref 32.0–36.0)
MCV: 90.3 fL (ref 78.0–99.0)
MONOCYTE #: 0.57 10*3/uL (ref 0.00–1.30)
MONOCYTE %: 7 % (ref 0–12)
MPV: 8.6 fL (ref 7.4–10.4)
NEUTROPHIL #: 6.46 10*3/uL (ref 1.80–8.40)
NEUTROPHIL %: 82 % — ABNORMAL HIGH (ref 40–76)
PLATELETS: 242 10*3/uL (ref 140–440)
RBC: 3.38 10*6/uL — ABNORMAL LOW (ref 4.20–5.40)
RDW: 20.8 % — ABNORMAL HIGH (ref 11.6–14.8)
WBC: 7.9 10*3/uL (ref 4.0–10.5)

## 2022-01-22 LAB — TROPONIN-I
TROPONIN I: 818 ng/L — ABNORMAL HIGH (ref <15)
TROPONIN I: 812 ng/L — ABNORMAL HIGH (ref <15)
TROPONIN I: 832 ng/L — ABNORMAL HIGH (ref <15)

## 2022-01-22 LAB — PT/INR
INR: 1.11 — ABNORMAL HIGH (ref 0.88–1.10)
INR: 1.1 (ref 0.88–1.10)
PROTHROMBIN TIME: 12.9 seconds — ABNORMAL HIGH (ref 9.8–12.7)
PROTHROMBIN TIME: 12.8 seconds — ABNORMAL HIGH (ref 9.8–12.7)

## 2022-01-22 LAB — PTT (PARTIAL THROMBOPLASTIN TIME)
APTT: 69.6 seconds — ABNORMAL HIGH (ref 22.0–31.7)
APTT: 123.4 seconds (ref 22.0–31.7)

## 2022-01-22 LAB — D-DIMER: D-DIMER: 3375 ng/mL DDU (ref <=232)

## 2022-01-22 LAB — MAGNESIUM: MAGNESIUM: 2 mg/dL (ref 1.8–2.4)

## 2022-01-22 MED ORDER — ALUMINUM-MAG HYDROXIDE-SIMETHICONE 200 MG-200 MG-20 MG/5 ML ORAL SUSP
ORAL | Status: AC
Start: 2022-01-22 — End: 2022-01-22
  Filled 2022-01-22: qty 30

## 2022-01-22 MED ORDER — NITROGLYCERIN 2 % TRANSDERMAL OINTMENT - PACKET
TOPICAL_OINTMENT | TRANSDERMAL | Status: AC
Start: 2022-01-22 — End: 2022-01-22
  Filled 2022-01-22: qty 1

## 2022-01-22 MED ORDER — FUROSEMIDE 10 MG/ML INJECTION SOLUTION
INTRAMUSCULAR | Status: AC
Start: 2022-01-22 — End: 2022-01-22
  Filled 2022-01-22: qty 10

## 2022-01-22 MED ORDER — LIDOCAINE HCL 2 % MUCOSAL SOLUTION
Status: AC
Start: 2022-01-22 — End: 2022-01-22
  Filled 2022-01-22: qty 15

## 2022-01-22 MED ORDER — FENTANYL (PF) 50 MCG/ML INJECTION SOLUTION
50.0000 ug | INTRAMUSCULAR | Status: AC
Start: 2022-01-22 — End: 2022-01-22
  Administered 2022-01-22: 50 ug via INTRAVENOUS

## 2022-01-22 MED ORDER — HYOSCYAMINE 0.125 MG/5 ML ORAL ELIXIR
10.0000 mL | ORAL_SOLUTION | Freq: Once | ORAL | Status: AC
Start: 2022-01-22 — End: 2022-01-22
  Administered 2022-01-22: 10 mL via ORAL

## 2022-01-22 MED ORDER — LIDOCAINE HCL 2 % MUCOSAL SOLUTION
15.0000 mL | Freq: Once | Status: AC
Start: 2022-01-22 — End: 2022-01-22
  Administered 2022-01-22: 15 mL via ORAL

## 2022-01-22 MED ORDER — HEPARIN (PORCINE) 5,000 UNIT/ML INJECTION SOLUTION
INTRAMUSCULAR | Status: AC
Start: 2022-01-22 — End: 2022-01-22
  Filled 2022-01-22: qty 1

## 2022-01-22 MED ORDER — NITROGLYCERIN 0.4 MG SUBLINGUAL TABLET
0.4000 mg | SUBLINGUAL_TABLET | SUBLINGUAL | Status: DC
Start: 2022-01-22 — End: 2022-01-22
  Administered 2022-01-22: 0 mg via SUBLINGUAL

## 2022-01-22 MED ORDER — FENTANYL (PF) 50 MCG/ML INJECTION SOLUTION
INTRAMUSCULAR | Status: AC
Start: 2022-01-22 — End: 2022-01-22
  Filled 2022-01-22: qty 2

## 2022-01-22 MED ORDER — HEPARIN (PORCINE) 5,000 UNITS/ML BOLUS
60.0000 [IU]/kg | Freq: Once | INTRAMUSCULAR | Status: AC
Start: 2022-01-22 — End: 2022-01-22
  Administered 2022-01-22: 4500 [IU] via INTRAVENOUS

## 2022-01-22 MED ORDER — HEPARIN (PORCINE) 25,000 UNIT/250 ML (100 UNIT/ML) IN DEXTROSE 5 % IV
INTRAVENOUS | Status: AC
Start: 2022-01-22 — End: 2022-01-22
  Filled 2022-01-22: qty 250

## 2022-01-22 MED ORDER — HEPARIN (PORCINE) 25,000 UNIT/250 ML (100 UNIT/ML) IN DEXTROSE 5 % IV
12.0000 [IU]/kg/h | INTRAVENOUS | Status: DC
Start: 2022-01-22 — End: 2022-01-22
  Administered 2022-01-22: 12 [IU]/kg/h via INTRAVENOUS

## 2022-01-22 MED ORDER — NITROGLYCERIN 2 % TRANSDERMAL OINTMENT - PACKET
1.0000 [in_us] | TOPICAL_OINTMENT | TRANSDERMAL | Status: AC
Start: 2022-01-22 — End: 2022-01-22
  Administered 2022-01-22: 1 [in_us] via TOPICAL

## 2022-01-22 MED ORDER — SODIUM CHLORIDE 0.9 % IV BOLUS
1000.0000 mL | INJECTION | Status: DC
Start: 2022-01-22 — End: 2022-01-22

## 2022-01-22 MED ORDER — HYOSCYAMINE 0.125 MG/5 ML ORAL ELIXIR
ORAL_SOLUTION | ORAL | Status: AC
Start: 2022-01-22 — End: 2022-01-22
  Filled 2022-01-22: qty 5

## 2022-01-22 MED ORDER — FUROSEMIDE 10 MG/ML INJECTION SOLUTION
60.0000 mg | INTRAMUSCULAR | Status: AC
Start: 2022-01-22 — End: 2022-01-22
  Administered 2022-01-22: 60 mg via INTRAVENOUS

## 2022-01-22 MED ORDER — ALUMINUM-MAG HYDROXIDE-SIMETHICONE 200 MG-200 MG-20 MG/5 ML ORAL SUSP
30.0000 mL | Freq: Once | ORAL | Status: AC
Start: 2022-01-22 — End: 2022-01-22
  Administered 2022-01-22: 30 mL via ORAL

## 2022-01-22 NOTE — ED Nurses Note (Signed)
Pt resting eyes closed

## 2022-01-22 NOTE — ED Nurses Note (Signed)
Report to [facility] at [facility]

## 2022-01-22 NOTE — ED Nurses Note (Signed)
Pt PTT drawn

## 2022-01-22 NOTE — ED Nurses Note (Signed)
Pt leaving with [employer] with heparin drip  and cardiac monitor, all belongings sent.

## 2022-01-22 NOTE — ED Nurses Note (Signed)
PTT came back at 123.4 dr Scarlett Presto informed he said to go by protocol,  treyvan at [facility] called informed to hold heparin x 60 mins then decrease by 3 mg/kg/hr he read back to me correct. He will call squad and informed and if concerns he will call me

## 2022-01-22 NOTE — ED Nurses Note (Signed)
Pt sleeps intermittently but awakes and rates lt chest pain 4.

## 2022-01-22 NOTE — ED Nurses Note (Signed)
Pt has been sleeping on and off, pt rates chest pain 6 lt anterior chest pressure, dr Scarlett Presto informed and fentanyl iv given

## 2022-01-22 NOTE — ED Nurses Note (Signed)
Pt released from [facility] yesterday, lt chest pressure and sharp pains started last night, with pain radiating to lt arm, pt took 3 NGT this am and ems gave 2 ngt pain still 10 after , dr Scarlett Presto informed and said to hold the NGT he ordered, also lt ankle 22g iv started and charted, but upon charting this iv was not an option, so dialysis was chosen but med given in lt ankle iv

## 2022-01-22 NOTE — ED Nurses Note (Signed)
[facility] no beds  [facility] wait list  [facility] will have Stepdown bed this evening

## 2022-01-22 NOTE — ED Triage Notes (Addendum)
Pt discharged from [facility] yesterday for cardiac related problems, last night lt side sharp pressure constant radiating down lt arm, 3 Ngt and asa 324 already given by ems

## 2022-01-22 NOTE — ED Nurses Note (Signed)
D Dimer reported to provider  at this time.

## 2022-01-22 NOTE — ED Nurses Note (Signed)
Dr Scarlett Presto informed of PTT being 69.6, per his verbal order go ahead with ordered dosing of heparin

## 2022-01-22 NOTE — ED Nurses Note (Signed)
Attempted x 2 to call report to [facility] phone rings and rings.

## 2022-01-22 NOTE — ED Provider Notes (Signed)
[facility], [facility] Emergency Department  ED Primary Provider Note  History of Present Illness   Chief Complaint   Patient presents with    Chest Pain      [name] is a 46 y.o. female who had concerns including Chest Pain .  Arrival: The patient arrived by Ambulance complaining of midsternal chest pain radiating to her left side of her chest and down her left arm which started proximally 8:00 a.m. last night.  Patient states she has continued with pain since then.  Patient was discharged from [facility] yesterday for chest pain.  Patient is complaining of shortness of breath with the pain.  Patient denies any nausea or diaphoresis.  Patient denies any recent cough.  Patient states that she was given 3 nitroglycerin and aspirin 3 24 by EMS in route.  Patient states she took 3 nitro at from 8:00 a.m. last night till this morning.  Patient has history of hypertension and high cholesterol.  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  Patient also has 1 stent.  Patient does have end-stage renal disease as well and has been in CHF in the past.  Patient has GERD.  Patient is a former smoker stating she has not had a cigarette 5 months.  Patient does admit to using drugs but denies anything recently.    HPI  Review of Systems   Review of Systems   Constitutional:  Positive for activity change and appetite change. Negative for chills and fever.   HENT:  Negative for ear pain and sore throat.    Eyes:  Negative for pain and visual disturbance.   Respiratory:  Positive for shortness of breath. Negative for cough.    Cardiovascular:  Positive for chest pain. Negative for palpitations.   Gastrointestinal:  Negative for abdominal pain and vomiting.   Genitourinary:  Negative for dysuria and hematuria.   Musculoskeletal:  Negative for arthralgias and back pain.   Skin:  Negative for color change and rash.   Neurological:  Negative for seizures and syncope.   All other systems reviewed and are negative.      Historical Data   History Reviewed This Encounter:     Physical Exam   ED Triage Vitals [01/22/22 0742]   BP (Non-Invasive) (!) 170/116   Heart Rate 96   Respiratory Rate 16   Temperature (!) 35.6 C (96 F)   SpO2 94 %   Weight 83.9 kg (185 lb)   Height 1.753 m (_0 )     Physical Exam  Vitals and nursing note reviewed.   Constitutional:       General: She is not in acute distress.     Appearance: She is well-developed. She is obese.   HENT:      Head: Normocephalic and atraumatic.      Right Ear: External ear normal.      Left Ear: External ear normal.      Nose: Nose normal.      Mouth/Throat:      Mouth: Mucous membranes are moist.   Eyes:      Extraocular Movements: Extraocular movements intact.      Conjunctiva/sclera: Conjunctivae normal.      Pupils: Pupils are equal, round, and reactive to light.   Cardiovascular:      Rate and Rhythm: Normal rate and regular rhythm.      Pulses: Normal pulses.      Heart sounds: Normal heart sounds. No murmur heard.  Pulmonary:  Effort: Pulmonary effort is normal. No respiratory distress.      Breath sounds: Normal breath sounds.   Abdominal:      General: Bowel sounds are normal.      Palpations: Abdomen is soft.      Tenderness: There is no abdominal tenderness.   Musculoskeletal:         General: No swelling. Normal range of motion.      Cervical back: Normal range of motion and neck supple.   Skin:     General: Skin is warm and dry.      Capillary Refill: Capillary refill takes less than 2 seconds.   Neurological:      General: No focal deficit present.      Mental Status: She is alert and oriented to person, place, and time.   Psychiatric:         Mood and Affect: Mood normal.         Behavior: Behavior normal.         Thought Content: Thought content normal.         Judgment: Judgment normal.       Patient Data     Labs Ordered/Reviewed   COMPREHENSIVE METABOLIC PANEL, NON-FASTING - Abnormal; Notable for the following components:       Result Value    SODIUM  129 (*)     CHLORIDE 90 (*)     ANION GAP 15 (*)     BUN 50 (*)     CREATININE 13.08 (*)     ESTIMATED GFR 3 (*)     GLUCOSE 112 (*)     PROTEIN TOTAL 8.4 (*)     All other components within normal limits    Narrative:     Estimated Glomerular Filtration Rate (eGFR) is calculated using the CKD-EPI (2021) equation, intended for patients 10 years of age and older. If gender is not documented or "unknown", there will be no eGFR calculation.   TROPONIN-I - Abnormal; Notable for the following components:    TROPONIN I 818 (*)     All other components within normal limits    Narrative:     Values received on females ranging between 12-15 ng/L MUST include the next serial troponin to review changes in the delta differences as the reference range for the Access II chemistry analyzer is lower than the established reference range.     TROPONIN-I - Abnormal; Notable for the following components:    TROPONIN I 812 (*)     All other components within normal limits    Narrative:     Values received on females ranging between 12-15 ng/L MUST include the next serial troponin to review changes in the delta differences as the reference range for the Access II chemistry analyzer is lower than the established reference range.     TROPONIN-I - Abnormal; Notable for the following components:    TROPONIN I 832 (*)     All other components within normal limits    Narrative:     Values received on females ranging between 12-15 ng/L MUST include the next serial troponin to review changes in the delta differences as the reference range for the Access II chemistry analyzer is lower than the established reference range.     PT/INR - Abnormal; Notable for the following components:    PROTHROMBIN TIME 12.9 (*)     INR 1.11 (*)     All other components within normal limits    Narrative:  INR OF 2.0-3.0  RECOMMENDED FOR: PROPHYLAXIS/TREATMENT OF VENEOUS THROMBOSIS, PULMONARY EMBOLISM, PREVENTION OF SYSTEMIC EMBOLISM FROM ATRIAL FIBRILATION,  MYOCARDIAL INFARCTION.    INR OF 2.5-3.5  RECOMMENDED FOR MECHANICAL PROSTHETIC HEART VALVES, RECURRENT SYSTEMIC EMBOLISM, RECURRENT MYOCARDIAL INFARCTION.     PTT (PARTIAL THROMBOPLASTIN TIME) - Abnormal; Notable for the following components:    APTT 69.6 (*)     All other components within normal limits   D-DIMER - Abnormal; Notable for the following components:    D-DIMER 3,375 (*)     All other components within normal limits   CBC WITH DIFF - Abnormal; Notable for the following components:    RBC 3.38 (*)     HGB 9.5 (*)     HCT 30.5 (*)     MCHC 31.2 (*)     RDW 20.8 (*)     NEUTROPHIL % 82 (*)     LYMPHOCYTE % 7 (*)     LYMPHOCYTE # 0.58 (*)     All other components within normal limits   MAGNESIUM - Normal   CBC/DIFF    Narrative:     The following orders were created for panel order CBC/DIFF.  Procedure                               Abnormality         Status                     ---------                               -----------         ------                     CBC WITH OPFY[[id number]]                Abnormal            Final result                 Please view results for these tests on the individual orders.   URINALYSIS WITH REFLEX MICROSCOPIC AND CULTURE IF POSITIVE    Narrative:     The following orders were created for panel order URINALYSIS WITH REFLEX MICROSCOPIC AND CULTURE IF POSITIVE.  Procedure                               Abnormality         Status                     ---------                               -----------         ------                     URINALYSIS, MACRO/MICRO[[id number]]                                                       Please view results  for these tests on the individual orders.   URINE DRUG SCREEN   URINALYSIS, MACRO/MICRO     XR AP MOBILE CHEST   Final Result by Edi, [facility] In (11/29 0840)   1. Interval worsening left mid to lower lung opacities when compared to 01/17/2022.   2. Relatively stable and persistent right mid to lower lung hazy and patchy opacities.    3. Postsurgical changes grossly stable from 01/17/2022.         Radiologist location ID: [place] Decision Making        Medical Decision Making  Patient is 18 year black female complaining midsternal chest pain radiating to her left side of her chest and down her left arm.  Patient states the pains associated with shortness of breath.  Pain started yesterday around 8:00 p.m.Marland Kitchen  She denies any nausea or diaphoresis.  Patient does have a history of hypertension and high cholesterol.  Patient has had a heart attack in the past and received 2 stents.  Patient received 3 nitro and a full aspirin in route by EMS.  Patient still complaining of having chest pain.  Patient will have labs and a chest x-ray done.  Patient will also have serial troponins.  Once patient's troponins come back will determine whether she needs to be admitted or whether she can be discharged to see her cardiologist in the next 2-3 days.  I spoke to [facility] who was trying to make a step-down bed for the patient.  It will be ready till this afternoon.      I spoke to [facility], hospitalist at [facility] general who accepted the patient for transfer and admission to their step-down unit.      Critical Care Time:  Total critical care time spent in direct care of this patient at high risk based on presenting history/exam/and complaint, including initial evaluation and stabilization, review of data, re-examination, discussion with admitting and consulting services to arrange definitive care, and exclusive of any procedures performed, was 45 minutes     Amount and/or Complexity of Data Reviewed  Labs: ordered.  Radiology: ordered.  ECG/medicine tests: ordered.     Details: Normal sinus rhythm 71, PR interval 230 MS, first-degree AV block, QT interval 474 MS, LVH, left axis deviation, prolonged QT interval, ST depression in V5 and V6.    Risk  OTC drugs.  Prescription drug management.  Parenteral controlled substances.              Medications Administered in the ED   nitroGLYCERIN (NITROSTAT) sublingual tablet (0 mg Sublingual Not Given 01/22/22 0800)   heparin 25,000 units in D5W 250 mL infusion (12 Units/kg/hr  73.3 kg (Adjusted) Intravenous New Bag/New Syringe 01/22/22 0849)   nitroGLYCERIN (NITRO-BID) 2 % topical ointment (1 Inch Apply Topically Given 01/22/22 0822)   fentaNYL (SUBLIMAZE) 50 mcg/mL injection (50 mcg Intravenous Given 01/22/22 0800)   aluminum-magnesium hydroxide-simethicone (MAG-AL PLUS) 200-200-20 mg per 5 mL oral liquid (30 mL Oral Given 01/22/22 0822)     And   hyoscyamine (HYOSYNE) 0.125 mg per 5 mL oral liquid (10 mL Oral Given 01/22/22 0822)     And   lidocaine (XYLOCAINE) 2% oral topical viscous solution (15 mL Oral Given 01/22/22 1000)   heparin 5,000 units/mL initial IV BOLUS (4,500 Units Intravenous Given 01/22/22 0850)   furosemide (LASIX) 10 mg/mL injection (60 mg Intravenous Given 01/22/22 1001)     Clinical Impression  Non-STEMI (non-ST elevated myocardial infarction) (CMS HCC) (Primary)   Chronic kidney failure, stage 5 (CMS HCC)   Chest pain, unspecified type       Disposition: Transfered to Another Facility               Clinical Impression   Non-STEMI (non-ST elevated myocardial infarction) (CMS HCC) (Primary)   Chronic kidney failure, stage 5 (CMS HCC)   Chest pain, unspecified type       Current Discharge Medication List

## 2022-01-30 ENCOUNTER — Emergency Department (EMERGENCY_DEPARTMENT_HOSPITAL): Payer: Medicare Other

## 2022-01-30 ENCOUNTER — Inpatient Hospital Stay (HOSPITAL_COMMUNITY): Payer: Medicare Other

## 2022-01-30 ENCOUNTER — Other Ambulatory Visit: Payer: Self-pay

## 2022-01-30 ENCOUNTER — Inpatient Hospital Stay
Admission: EM | Admit: 2022-01-30 | Discharge: 2022-02-02 | DRG: 280 | Disposition: A | Discharge status: Home / Self Care | Payer: Medicare Other | Attending: Internal Medicine | Admitting: Internal Medicine

## 2022-01-30 DIAGNOSIS — Z9861 Coronary angioplasty status: Secondary | ICD-10-CM

## 2022-01-30 DIAGNOSIS — Z86718 Personal history of other venous thrombosis and embolism: Secondary | ICD-10-CM

## 2022-01-30 DIAGNOSIS — Z1152 Encounter for screening for COVID-19: Secondary | ICD-10-CM

## 2022-01-30 DIAGNOSIS — G4733 Obstructive sleep apnea (adult) (pediatric): Secondary | ICD-10-CM | POA: Diagnosis present

## 2022-01-30 DIAGNOSIS — F17210 Nicotine dependence, cigarettes, uncomplicated: Secondary | ICD-10-CM | POA: Diagnosis present

## 2022-01-30 DIAGNOSIS — I160 Hypertensive urgency: Secondary | ICD-10-CM | POA: Diagnosis present

## 2022-01-30 DIAGNOSIS — E8770 Fluid overload, unspecified: Secondary | ICD-10-CM | POA: Diagnosis present

## 2022-01-30 DIAGNOSIS — R0600 Dyspnea, unspecified: Secondary | ICD-10-CM

## 2022-01-30 DIAGNOSIS — D631 Anemia in chronic kidney disease: Secondary | ICD-10-CM | POA: Diagnosis present

## 2022-01-30 DIAGNOSIS — N2581 Secondary hyperparathyroidism of renal origin: Secondary | ICD-10-CM | POA: Diagnosis present

## 2022-01-30 DIAGNOSIS — R918 Other nonspecific abnormal finding of lung field: Secondary | ICD-10-CM

## 2022-01-30 DIAGNOSIS — I5032 Chronic diastolic (congestive) heart failure: Secondary | ICD-10-CM | POA: Diagnosis present

## 2022-01-30 DIAGNOSIS — I120 Hypertensive chronic kidney disease with stage 5 chronic kidney disease or end stage renal disease: Secondary | ICD-10-CM

## 2022-01-30 DIAGNOSIS — Z79899 Other long term (current) drug therapy: Secondary | ICD-10-CM

## 2022-01-30 DIAGNOSIS — R079 Chest pain, unspecified: Secondary | ICD-10-CM

## 2022-01-30 DIAGNOSIS — J810 Acute pulmonary edema: Secondary | ICD-10-CM | POA: Diagnosis present

## 2022-01-30 DIAGNOSIS — Z9981 Dependence on supplemental oxygen: Secondary | ICD-10-CM

## 2022-01-30 DIAGNOSIS — I255 Ischemic cardiomyopathy: Secondary | ICD-10-CM | POA: Diagnosis present

## 2022-01-30 DIAGNOSIS — R7989 Other specified abnormal findings of blood chemistry: Secondary | ICD-10-CM | POA: Diagnosis present

## 2022-01-30 DIAGNOSIS — I214 Non-ST elevation (NSTEMI) myocardial infarction: Secondary | ICD-10-CM | POA: Diagnosis present

## 2022-01-30 DIAGNOSIS — J189 Pneumonia, unspecified organism: Secondary | ICD-10-CM | POA: Diagnosis present

## 2022-01-30 DIAGNOSIS — I259 Chronic ischemic heart disease, unspecified: Secondary | ICD-10-CM

## 2022-01-30 DIAGNOSIS — J45909 Unspecified asthma, uncomplicated: Secondary | ICD-10-CM

## 2022-01-30 DIAGNOSIS — I081 Rheumatic disorders of both mitral and tricuspid valves: Secondary | ICD-10-CM | POA: Diagnosis present

## 2022-01-30 DIAGNOSIS — I252 Old myocardial infarction: Secondary | ICD-10-CM

## 2022-01-30 DIAGNOSIS — I871 Compression of vein: Secondary | ICD-10-CM | POA: Diagnosis present

## 2022-01-30 DIAGNOSIS — N186 End stage renal disease: Secondary | ICD-10-CM | POA: Diagnosis present

## 2022-01-30 DIAGNOSIS — I2510 Atherosclerotic heart disease of native coronary artery without angina pectoris: Secondary | ICD-10-CM

## 2022-01-30 DIAGNOSIS — J9621 Acute and chronic respiratory failure with hypoxia: Secondary | ICD-10-CM | POA: Diagnosis present

## 2022-01-30 DIAGNOSIS — Y95 Nosocomial condition: Secondary | ICD-10-CM | POA: Diagnosis present

## 2022-01-30 DIAGNOSIS — K219 Gastro-esophageal reflux disease without esophagitis: Secondary | ICD-10-CM | POA: Diagnosis present

## 2022-01-30 DIAGNOSIS — Z992 Dependence on renal dialysis: Secondary | ICD-10-CM

## 2022-01-30 DIAGNOSIS — I25112 Atherosclerotic heart disease of native coronary artery with refractory angina pectoris: Secondary | ICD-10-CM | POA: Diagnosis present

## 2022-01-30 DIAGNOSIS — J811 Chronic pulmonary edema: Secondary | ICD-10-CM

## 2022-01-30 DIAGNOSIS — Z955 Presence of coronary angioplasty implant and graft: Secondary | ICD-10-CM

## 2022-01-30 DIAGNOSIS — I132 Hypertensive heart and chronic kidney disease with heart failure and with stage 5 chronic kidney disease, or end stage renal disease: Principal | ICD-10-CM | POA: Diagnosis present

## 2022-01-30 LAB — CBC WITH DIFF
BASOPHIL #: 0 10*3/uL (ref 0.00–0.10)
BASOPHIL %: 0 % (ref 0–1)
EOSINOPHIL #: 0.2 10*3/uL (ref 0.00–0.50)
EOSINOPHIL %: 1 %
HCT: 27.4 % — ABNORMAL LOW (ref 31.2–41.9)
HGB: 8.5 g/dL — ABNORMAL LOW (ref 10.9–14.3)
LYMPHOCYTE #: 0.5 10*3/uL — ABNORMAL LOW (ref 1.00–3.00)
LYMPHOCYTE %: 3 % — ABNORMAL LOW (ref 16–44)
MCH: 28 pg (ref 24.7–32.8)
MCHC: 31.1 g/dL — ABNORMAL LOW (ref 32.3–35.6)
MCV: 90.1 fL (ref 75.5–95.3)
MONOCYTE #: 1.3 10*3/uL — ABNORMAL HIGH (ref 0.30–1.00)
MONOCYTE %: 9 % (ref 5–13)
MPV: 7.1 fL — ABNORMAL LOW (ref 7.9–10.8)
NEUTROPHIL #: 12.4 10*3/uL — ABNORMAL HIGH (ref 1.85–7.80)
NEUTROPHIL %: 86 % — ABNORMAL HIGH (ref 43–77)
PLATELET COMMENT: NORMAL
PLATELETS: 285 10*3/uL (ref 140–440)
RBC: 3.04 10*6/uL — ABNORMAL LOW (ref 3.63–4.92)
RDW: 22.1 % — ABNORMAL HIGH (ref 12.3–17.7)
WBC: 14.3 10*3/uL — ABNORMAL HIGH (ref 3.8–11.8)

## 2022-01-30 LAB — TROPONIN-I
TROPONIN I: 2226 ng/L (ref <15)
TROPONIN I: 2472 ng/L (ref <15)
TROPONIN I: 2783 ng/L (ref <15)
TROPONIN I: 2728 ng/L (ref <15)

## 2022-01-30 LAB — COMPREHENSIVE METABOLIC PANEL, NON-FASTING
ALBUMIN/GLOBULIN RATIO: 1.3 (ref 0.8–1.4)
ALBUMIN: 4 g/dL (ref 3.5–5.7)
ALKALINE PHOSPHATASE: 70 U/L (ref 34–104)
ALT (SGPT): 9 U/L (ref 7–52)
ANION GAP: 15 mmol/L — ABNORMAL HIGH (ref 4–13)
AST (SGOT): 15 U/L (ref 13–39)
BILIRUBIN TOTAL: 0.9 mg/dL (ref 0.3–1.2)
BUN/CREA RATIO: 3 — ABNORMAL LOW (ref 6–22)
BUN: 24 mg/dL (ref 7–25)
CALCIUM, CORRECTED: 9.1 mg/dL (ref 8.9–10.8)
CALCIUM: 9.1 mg/dL (ref 8.6–10.3)
CHLORIDE: 99 mmol/L (ref 98–107)
CO2 TOTAL: 23 mmol/L (ref 21–31)
CREATININE: 6.96 mg/dL — ABNORMAL HIGH (ref 0.60–1.30)
ESTIMATED GFR: 7 mL/min/{1.73_m2} — ABNORMAL LOW (ref >59)
GLOBULIN: 3.1 (ref 2.9–5.4)
GLUCOSE: 84 mg/dL (ref 74–109)
OSMOLALITY, CALCULATED: 277 mOsm/kg (ref 270–290)
POTASSIUM: 3.7 mmol/L (ref 3.5–5.1)
PROTEIN TOTAL: 7.1 g/dL (ref 6.4–8.9)
SODIUM: 137 mmol/L (ref 136–145)

## 2022-01-30 MED ORDER — TICAGRELOR 90 MG TABLET
ORAL_TABLET | ORAL | Status: AC
Start: 2022-01-30 — End: 2022-01-30
  Filled 2022-01-30: qty 1

## 2022-01-30 MED ORDER — VANCOMYCIN 10 GRAM INTRAVENOUS SOLUTION
25.0000 mg/kg | Freq: Once | INTRAVENOUS | Status: AC
Start: 2022-01-30 — End: 2022-01-30
  Administered 2022-01-30: 1750 mg via INTRAVENOUS
  Administered 2022-01-30: 0 mg via INTRAVENOUS
  Filled 2022-01-30: qty 17.5

## 2022-01-30 MED ORDER — CEFEPIME 1 GRAM SOLUTION FOR INJECTION
INTRAMUSCULAR | Status: AC
Start: 2022-01-30 — End: 2022-01-30
  Filled 2022-01-30: qty 10

## 2022-01-30 MED ORDER — AMLODIPINE 5 MG TABLET
10.0000 mg | ORAL_TABLET | Freq: Every day | ORAL | Status: DC
Start: 2022-01-30 — End: 2022-02-02
  Administered 2022-01-30 – 2022-02-02 (×4): 10 mg via ORAL
  Filled 2022-01-30 (×3): qty 2

## 2022-01-30 MED ORDER — PANTOPRAZOLE 40 MG TABLET,DELAYED RELEASE
DELAYED_RELEASE_TABLET | ORAL | Status: AC
Start: 2022-01-30 — End: 2022-01-30
  Filled 2022-01-30: qty 1

## 2022-01-30 MED ORDER — DOXAZOSIN 4 MG TABLET
4.0000 mg | ORAL_TABLET | Freq: Every evening | ORAL | Status: DC
Start: 2022-01-30 — End: 2022-02-02
  Administered 2022-01-30 – 2022-02-01 (×3): 4 mg via ORAL
  Filled 2022-01-30 (×4): qty 1

## 2022-01-30 MED ORDER — ASPIRIN 81 MG CHEWABLE TABLET
324.0000 mg | CHEWABLE_TABLET | ORAL | Status: DC
Start: 2022-01-30 — End: 2022-02-02
  Administered 2022-01-30: 0 mg via ORAL

## 2022-01-30 MED ORDER — ASPIRIN 81 MG CHEWABLE TABLET
CHEWABLE_TABLET | ORAL | Status: AC
Start: 2022-01-30 — End: 2022-01-30
  Filled 2022-01-30: qty 4

## 2022-01-30 MED ORDER — NITROGLYCERIN 0.4 MG SUBLINGUAL TABLET
0.4000 mg | SUBLINGUAL_TABLET | SUBLINGUAL | Status: DC | PRN
Start: 2022-01-30 — End: 2022-02-02

## 2022-01-30 MED ORDER — ACETAMINOPHEN 325 MG TABLET
650.0000 mg | ORAL_TABLET | ORAL | Status: DC | PRN
Start: 2022-01-30 — End: 2022-02-02

## 2022-01-30 MED ORDER — HYDROMORPHONE 2 MG/ML INJECTION WRAPPER
INJECTION | INTRAMUSCULAR | Status: AC
Start: 2022-01-30 — End: 2022-01-30
  Filled 2022-01-30: qty 1

## 2022-01-30 MED ORDER — BUSPIRONE 5 MG TABLET
ORAL_TABLET | ORAL | Status: AC
Start: 2022-01-30 — End: 2022-01-30
  Filled 2022-01-30: qty 2

## 2022-01-30 MED ORDER — SODIUM CHLORIDE 0.9 % INTRAVENOUS PIGGYBACK
1.0000 g | Freq: Once | INTRAVENOUS | Status: AC
Start: 2022-01-30 — End: 2022-01-30
  Administered 2022-01-30: 1 g via INTRAVENOUS
  Administered 2022-01-30: 0 g via INTRAVENOUS

## 2022-01-30 MED ORDER — AMLODIPINE 5 MG TABLET
ORAL_TABLET | ORAL | Status: AC
Start: 2022-01-30 — End: 2022-01-30
  Filled 2022-01-30: qty 2

## 2022-01-30 MED ORDER — NITROGLYCERIN 2 % TRANSDERMAL OINTMENT - PACKET
1.0000 [in_us] | TOPICAL_OINTMENT | TRANSDERMAL | Status: AC
Start: 2022-01-30 — End: 2022-01-30
  Administered 2022-01-30: 1 [in_us] via TOPICAL

## 2022-01-30 MED ORDER — SODIUM CHLORIDE 0.9 % INTRAVENOUS PIGGYBACK
INJECTION | INTRAVENOUS | Status: AC
Start: 2022-01-30 — End: 2022-01-30
  Filled 2022-01-30: qty 50

## 2022-01-30 MED ORDER — BUSPIRONE 5 MG TABLET
ORAL_TABLET | ORAL | Status: AC
Start: 2022-01-30 — End: 2022-01-30
  Filled 2022-01-30: qty 1

## 2022-01-30 MED ORDER — LABETALOL 200 MG TABLET
800.0000 mg | ORAL_TABLET | Freq: Three times a day (TID) | ORAL | Status: DC
Start: 2022-01-30 — End: 2022-02-02
  Administered 2022-01-30 – 2022-01-31 (×3): 800 mg via ORAL
  Administered 2022-01-31: 0 mg via ORAL
  Administered 2022-01-31 – 2022-02-01 (×3): 800 mg via ORAL
  Administered 2022-02-01: 0 mg via ORAL
  Administered 2022-02-02: 800 mg via ORAL
  Filled 2022-01-30 (×12): qty 4

## 2022-01-30 MED ORDER — CLONIDINE HCL 0.1 MG TABLET
ORAL_TABLET | ORAL | Status: AC
Start: 2022-01-30 — End: 2022-01-30
  Filled 2022-01-30: qty 2

## 2022-01-30 MED ORDER — ONDANSETRON HCL (PF) 4 MG/2 ML INJECTION SOLUTION
4.0000 mg | Freq: Four times a day (QID) | INTRAMUSCULAR | Status: DC | PRN
Start: 2022-01-30 — End: 2022-02-02
  Administered 2022-02-01: 4 mg via INTRAVENOUS
  Filled 2022-01-30: qty 2

## 2022-01-30 MED ORDER — HYDROMORPHONE 2 MG/ML INJECTION WRAPPER
0.2000 mg | INJECTION | INTRAMUSCULAR | Status: DC | PRN
Start: 2022-01-30 — End: 2022-02-02
  Administered 2022-01-30 – 2022-02-02 (×9): 0.2 mg via INTRAVENOUS
  Filled 2022-01-30 (×7): qty 1

## 2022-01-30 MED ORDER — BUSPIRONE 5 MG TABLET
10.0000 mg | ORAL_TABLET | Freq: Three times a day (TID) | ORAL | Status: DC
Start: 2022-01-30 — End: 2022-02-02
  Administered 2022-01-30 (×2): 10 mg via ORAL
  Administered 2022-01-31: 0 mg via ORAL
  Administered 2022-01-31 – 2022-02-01 (×4): 10 mg via ORAL
  Administered 2022-02-01: 0 mg via ORAL
  Administered 2022-02-02: 10 mg via ORAL
  Filled 2022-01-30 (×6): qty 2

## 2022-01-30 MED ORDER — PRAVASTATIN 10 MG TABLET
ORAL_TABLET | ORAL | Status: AC
Start: 2022-01-30 — End: 2022-01-30
  Filled 2022-01-30: qty 4

## 2022-01-30 MED ORDER — TICAGRELOR 90 MG TABLET
90.0000 mg | ORAL_TABLET | Freq: Two times a day (BID) | ORAL | Status: DC
Start: 2022-01-30 — End: 2022-02-02
  Administered 2022-01-30 – 2022-02-02 (×6): 90 mg via ORAL
  Filled 2022-01-30 (×5): qty 1

## 2022-01-30 MED ORDER — PRAVASTATIN 10 MG TABLET
40.0000 mg | ORAL_TABLET | Freq: Every evening | ORAL | Status: DC
Start: 2022-01-30 — End: 2022-02-02
  Administered 2022-01-30 – 2022-02-01 (×3): 40 mg via ORAL
  Filled 2022-01-30 (×2): qty 4

## 2022-01-30 MED ORDER — CLONIDINE HCL 0.1 MG TABLET
0.2000 mg | ORAL_TABLET | Freq: Two times a day (BID) | ORAL | Status: DC
Start: 2022-01-30 — End: 2022-02-02
  Administered 2022-01-30 – 2022-02-02 (×6): 0.2 mg via ORAL
  Filled 2022-01-30 (×5): qty 2

## 2022-01-30 MED ORDER — SODIUM CHLORIDE 0.9 % INTRAVENOUS SOLUTION
500.0000 mg | INTRAVENOUS | Status: DC
Start: 2022-01-31 — End: 2022-02-02
  Administered 2022-01-31: 0 mg via INTRAVENOUS
  Administered 2022-01-31 – 2022-02-01 (×2): 500 mg via INTRAVENOUS
  Administered 2022-02-01: 0 mg via INTRAVENOUS
  Filled 2022-01-30 (×3): qty 10

## 2022-01-30 MED ORDER — VANCOMYCIN IV - PHARMACIST TO DOSE PER PROTOCOL
Freq: Every day | Status: DC | PRN
Start: 2022-01-30 — End: 2022-02-02

## 2022-01-30 MED ORDER — PANTOPRAZOLE 40 MG TABLET,DELAYED RELEASE
40.0000 mg | DELAYED_RELEASE_TABLET | Freq: Every day | ORAL | Status: DC
Start: 2022-01-30 — End: 2022-02-02
  Administered 2022-01-30 – 2022-02-02 (×4): 40 mg via ORAL
  Filled 2022-01-30 (×3): qty 1

## 2022-01-30 MED ORDER — NITROGLYCERIN 2 % TRANSDERMAL OINTMENT - PACKET
TOPICAL_OINTMENT | TRANSDERMAL | Status: AC
Start: 2022-01-30 — End: 2022-01-30
  Filled 2022-01-30: qty 1

## 2022-01-30 NOTE — ED Nurses Note (Signed)
Pt is requesting something for pain. Provider notified. Nurse offered PRN tylenol until provider responds. Pt declines and states "fuck that tylenol." Nurse awaiting orders from provider.

## 2022-01-30 NOTE — H&P (Signed)
[facility]  Admission H&P        Date of Service:  01/30/2022  Dawn Cousins46 y.o. female  Date of Admission:  01/30/2022  Date of Birth:  12/01/1975      Chief Complaint:    Chief Complaint   Patient presents with    Chest Pain        HPI: [name] is a 46 y.o., 73 American female who presents with his pain and shortness of breath.  The patient was recently discharged from [facility], yesterday.  During her hospitalization she did have a cardiac catheterization with LAD stenting by Dr. Gates Rigg.  She was found to have severe multivessel CAD, reportedly not fit candidate for CABG, evaluated at [facility] in the past.  Does have a history of cardiomyopathy.  Echo from November 2023 showed an EF of 55-60% Patient was also treated for hypertensive urgency with volume overload with CHF.  She does have end-stage renal disease.  States that she takes dialysis daily.  Reports that they have been doing it daily so they do not have to pull as much fluid off at 1 time.  Chest x-ray showed bilateral diffuse alveolar infiltrates, appear somewhat worse than the last exam.  Troponin greater than 2000, however the interventional cardiologist, Dr Gates Rigg was notified of this finding, stated that this was to be expected post catheterization.  Patient is complaining of a midsternal chest pain that radiates to the left side.  States that it started this morning, she was just discharged last night from [facility].  Patient was also treated for pneumonia while at [facility].  She did complete a full session of dialysis yesterday.  Blood pressure is 170/112 in the emergency department.  WBCs 14.3 and hemoglobin 8.5.  She wears O2 4 L nasal cannula continuously at home.  Patient was started on Brilinta during her hospitalization at [facility], had not filled the prescription at discharge, but again she was just discharged last night.  Reports compliance with medications.  Take several antihypertensive  medications.  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 She has a 10.00 pack-year smoking history. She has been exposed to tobacco smoke. She has quit using smokeless tobacco. She reports that she does not currently use alcohol. She reports current drug use. Drug: Marijuana.    Allergies   Allergen Reactions    Lisinopril Swelling     Tongue and throat swelling    Cardene [Nicardipine]  Other Adverse Reaction (Add comment)     Chest pain     Oxycodone Itching     Medications Prior to Admission       Prescriptions    amLODIPine (NORVASC) 10 mg Oral Tablet    Take 1 Tablet (10 mg total) by mouth Once a day Take one tablet every day    busPIRone (BUSPAR) 10 mg Oral Tablet    Take 1 Tablet (10 mg total) by mouth Three times a day    cloNIDine HCL (CATAPRES) 0.2 mg Oral Tablet    Take 1 Tablet (0.2 mg total) by mouth Twice daily Take one  tablet twice a day    doxazosin (CARDURA) 4 mg Oral Tablet    Take 1 Tablet (4 mg total) by mouth Every evening    labetaloL (NORMODYNE) 200 mg Oral Tablet    Take 4 Tablets (800 mg total) by mouth Three times a day    nitroGLYCERIN (NITROSTAT) 0.4 mg Sublingual Tablet, Sublingual    Place 1 Tablet (0.4 mg total) under the tongue Every 5 minutes as needed for Chest pain for up to 3 doses for 3 doses over 15 minutes    omeprazole (PRILOSEC) 40 mg Oral Capsule, Delayed Release(E.C.)    Take 1 Capsule (40 mg total) by mouth Once a day    rosuvastatin (CRESTOR) 40 mg Oral Tablet    Take 1 Tablet (40 mg total) by mouth Every evening    ticagrelor (BRILINTA) 90 mg Oral Tablet    Take 1 Tablet (90 mg total) by mouth Twice daily          acetaminophen (TYLENOL) tablet, 650 mg, Oral, Q4H PRN  aspirin chewable tablet 324 mg, 324 mg, Oral, Now  cefepime (MAXIPIME) 1 g in NS 50 mL IVPB minibag, 1 g, Intravenous, Q12H  ondansetron (ZOFRAN) 2 mg/mL injection, 4 mg, Intravenous, Q6H PRN  ticagrelor (BRILINTA) tablet, 90 mg, Oral, 2x/day  Vancomycin IV - Pharmacist to Dose per Protocol, , Does not apply, Daily PRN        ROS:   Review of Systems   Constitutional: Negative for chills, diaphoresis and fever.   HENT: Negative for congestion, sinus pain, sore throat and tinnitus.    Eyes: Negative for blurred vision and double vision.   Respiratory:  Positive for shortness of breath    Cardiovascular:  Positive for chest pain  Gastrointestinal: Negative for abdominal pain, blood in stool, diarrhea, melena, nausea and vomiting.   Genitourinary: Negative for dysuria, flank pain and hematuria.   Musculoskeletal: Negative for joint pain and myalgias.   Skin: Negative for rash.   Neurological: Negative for dizziness, focal weakness and seizures.   Endo/Heme/Allergies: Negative for environmental allergies.   Psychiatric/Behavioral: Negative for depression, substance abuse and suicidal ideas.     All other systems negative unless marked.        Exam:  Vitals:    01/30/22 1140 01/30/22 1245   BP: Marland Kitchen)  168/107 (!) 178/109   Pulse: 99 88   Resp: 16 18   Temp: 37.1 C (98.8 F)    SpO2: 93%    Weight: 84.5 kg (186 lb 4.6 oz)    Height: 1.753 m (_0 )    BMI: 27.57          No intake or output data in the 24 hours ending 01/30/22 1442     Physical Exam   Constitutional: Patient is oriented to person, place, and time and well-developed, well-nourished, and in no distress.   HENT: Mucus membranes moist.  No oral ulcer.  Head: Normocephalic and atraumatic.   Cardiovascular: Normal rate, regular rhythm and normal heart sounds. Exam reveals no gallop and no friction rub.   No murmur heard.  Pulmonary/Chest:  Scattered rhonchi, O2 4 L nasal cannula blood.  Abdominal: Soft. Bowel sounds are normal. Patient exhibits no distension. There is no abdominal tenderness. There is no rebound and no guarding.    Neurological: Patient is alert and oriented to person, place, and time.   Skin: Skin is warm and dry. No erythema.   Psychiatric: Affect and judgment normal.     Labs:     Results for orders placed or performed during the hospital encounter of 01/30/22 (from the past 24 hour(s))   RESPIRATORY CULTURE AND GRAM STAIN, AEROBIC    Specimen: Sputum; Other    Narrative    The following orders were created for panel order RESPIRATORY CULTURE AND GRAM STAIN, AEROBIC.  Procedure                               Abnormality         Status                     ---------                               -----------         ------                     SPUTUM [id number]]                                                                 Please view results for these tests on the individual orders.   CBC/DIFF    Narrative    The following orders were created for panel order CBC/DIFF.  Procedure                               Abnormality         Status                     ---------                               -----------         ------                     CBC  WITH UXLK[[id number]]                 Abnormal            Final result                 Please view results for these tests on the individual orders.   COMPREHENSIVE METABOLIC PANEL, NON-FASTING   Result Value Ref Range    SODIUM 137 136 - 145 mmol/L    POTASSIUM 3.7 3.5 - 5.1 mmol/L    CHLORIDE 99 98 - 107 mmol/L    CO2 TOTAL 23 21 - 31 mmol/L    ANION GAP 15 (H) 4 - 13 mmol/L    BUN 24 7 - 25 mg/dL    CREATININE 6.96 (H) 0.60 - 1.30 mg/dL    BUN/CREA RATIO 3 (L) 6 - 22    ESTIMATED GFR 7 (L) >59 mL/min/1.70m2    ALBUMIN 4.0 3.5 - 5.7 g/dL    CALCIUM 9.1 8.6 - 10.3 mg/dL    GLUCOSE 84 74 - 109 mg/dL    ALKALINE PHOSPHATASE 70 34 - 104 U/L    ALT (SGPT) 9 7 - 52 U/L    AST (SGOT) 15 13 - 39 U/L    BILIRUBIN TOTAL 0.9 0.3 - 1.2 mg/dL    PROTEIN TOTAL 7.1 6.4 - 8.9 g/dL    ALBUMIN/GLOBULIN RATIO 1.3 0.8 - 1.4    OSMOLALITY, CALCULATED 277 270 - 290 mOsm/kg    CALCIUM, CORRECTED 9.1 8.9 - 10.8 mg/dL    GLOBULIN 3.1 2.9 - 5.4    Narrative    Estimated Glomerular Filtration Rate (eGFR) is calculated using the CKD-EPI (2021) equation, intended for patients 141years of age and older. If gender is not documented or "unknown", there will be no eGFR calculation.   TROPONIN-I NOW   Result Value Ref Range    TROPONIN I 2,226 (HH) <15 ng/L   CBC WITH DIFF   Result Value Ref Range    WBC 14.3 (H) 3.8 - 11.8 x10^3/uL    RBC 3.04 (L) 3.63 - 4.92 x10^6/uL    HGB 8.5 (L) 10.9 - 14.3 g/dL    HCT 27.4 (L) 31.2 - 41.9 %    MCV 90.1 75.5 - 95.3 fL    MCH 28.0 24.7 - 32.8 pg    MCHC 31.1 (L) 32.3 - 35.6 g/dL    RDW 22.1 (H) 12.3 - 17.7 %    PLATELETS 285 140 - 440 x10^3/uL    MPV 7.1 (L) 7.9 - 10.8 fL    NEUTROPHIL % 86 (H) 43 - 77 %    LYMPHOCYTE % 3 (L) 16 - 44 %    MONOCYTE % 9 5 - 13 %    EOSINOPHIL % 1 %    BASOPHIL % 0 0 - 1 %    NEUTROPHIL # 12.40 (H) 1.85 - 7.80 x10^3/uL    LYMPHOCYTE # 0.50 (L) 1.00 - 3.00 x10^3/uL    MONOCYTE # 1.30 (H) 0.30 - 1.00 x10^3/uL    EOSINOPHIL # 0.20 0.00 - 0.50 x10^3/uL    BASOPHIL # 0.00 0.00 - 0.10 x10^3/uL    RBC COMMENT  Moderate Hypochromia, Few Burr Cells     PLATELET COMMENT Normal         Imaging Studies:    XR AP MOBILE CHEST   Final Result   Bilateral fairly diffuse alveolar infiltrates are again noted and appear somewhat worse than on the last examination.  Radiologist location ID: [id number]             Code Status:  Prior    Assessment/Plan:   Active Hospital Problems    Diagnosis    Primary Problem: Chest pain    Elevated troponin    Ischemic cardiomyopathy    ESRD (end stage renal disease) (CMS HCC)    Hypertensive urgency    Fluid overload     1. Chest pain with elevated troponins/recent stent to the LAD  2. End-stage renal disease, on dialysis  3. Hypertensive urgency  4. Fluid overload  5. Suspected healthcare associated pneumonia    We will admit the patient to inpatient telemetry.  We will consult tele cards, serial troponin.  Patient had a cardiac echo performed back in November of 2023 which showed an EF of 55-60%.  We will resume aspirin and Brilinta.  Monitor blood pressure, keep on antihypertensives, may add IV medication p.r.n..  Nitroglycerin and morphine for chest pain p.r.n..  Consult Nephrology, patient may need dialyzed today, appreciate recommendations.  Sputum and blood cultures, IV vancomycin and cefepime.  Supportive care, labs in the a.m.      DVT/PE Prophylaxis:  Brilinta  Disposition Planning:  Patient is acutely ill requiring cardiac and nephrology evaluation.  Expect greater than 48 hour hospitalization.    Reinhart Saulters, FNP-BC    This note was partially generated using MModal Fluency Direct system, and there may be some incorrect words, spellings, and punctuation that were not noted in checking the note before saving.

## 2022-01-30 NOTE — Consults (Signed)
[facility]   Nephrology Consult      [place], [place], 46 y.o. female  Encounter Start Date:  01/30/2022  Inpatient Admission Date:  01/30/2022  Date of Birth:  07-04-1975    Admitting Provider:  Hospitalist service    Reason for Consult:  End-stage renal disease on hemodialysis with fluid overload    HPI:  [name] is a 46 y.o. 49 American female with history of chest pain, dyspnea and renal failure. She was discharged yesterday from [facility] where she was admitted for NSTEMI. She reports having a cath showing significant CAD.  She has severe underlying coronary disease   So this patient known to have end-stage renal disease on hemodialysis 3 times a week she is been dialyzed almost daily in [facility] as she said still short of breath fluid overloaded will dialyze her today with ultrafiltration 3 L continue to evaluate her for need for dialysis on a daily basis anemia of chronic kidney disease treatment per protocol secondary hyperparathyroidism calcium Rocaltrol    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    Tricuspid valve regurgitation     Uncontrolled hypertension          Medications Prior to Admission       Prescriptions    amLODIPine (NORVASC) 10 mg Oral Tablet    Take 1 Tablet (10 mg total) by mouth Once a day Take one tablet every day    busPIRone (BUSPAR) 10 mg Oral Tablet    Take 1 Tablet (10 mg total) by mouth Three times a day    cloNIDine HCL (CATAPRES) 0.2 mg Oral Tablet    Take 1 Tablet (0.2 mg total) by mouth Twice daily Take one  tablet twice a day    doxazosin (CARDURA) 4 mg Oral Tablet    Take 1 Tablet (4 mg total) by mouth Every evening    labetaloL (NORMODYNE) 200 mg Oral Tablet    Take 4 Tablets (800 mg total) by mouth Three times a day    nitroGLYCERIN (NITROSTAT) 0.4 mg Sublingual Tablet, Sublingual    Place 1 Tablet (0.4 mg total) under the tongue Every 5 minutes as needed for Chest pain for up to 3 doses for 3 doses over 15 minutes    omeprazole (PRILOSEC) 40 mg Oral Capsule, Delayed Release(E.C.)    Take 1 Capsule (40 mg total) by mouth Once a day    rosuvastatin (CRESTOR) 40 mg Oral Tablet    Take 1 Tablet (40 mg total) by mouth Every evening    ticagrelor (BRILINTA) 90 mg Oral Tablet    Take 1 Tablet (90 mg total) by mouth Twice daily          acetaminophen (TYLENOL) tablet, 650 mg, Oral, Q4H PRN  amLODIPine (NORVASC) tablet, 10 mg, Oral, Daily  aspirin chewable tablet 324 mg, 324 mg, Oral, Now  busPIRone (BUSPAR) tablet, 10 mg, Oral,  3x/day  cefepime (MAXIPIME) 1 g in NS 50 mL IVPB minibag, 1 g, Intravenous, Once  [START ON 01/31/2022] cefepime (MAXIPIME) 500 mg in NS 50 mL IVPB, 500 mg, Intravenous, Q24H  cloNIDine (CATAPRES) tablet, 0.2 mg, Oral, 2x/day  doxazosin (CARDURA) tablet, 4 mg, Oral, QPM  HYDROmorphone (DILAUDID) 2 mg/mL injection, 0.2 mg, Intravenous, Q4H PRN  labetalol (NORMODYNE) tablet, 800 mg, Oral, 3x/day  nitroGLYCERIN (NITROSTAT) sublingual tablet, 0.4 mg, Sublingual, Q5 Min PRN  ondansetron (ZOFRAN) 2 mg/mL injection, 4 mg, Intravenous, Q6H PRN  pantoprazole (PROTONIX) delayed release tablet, 40 mg, Oral, Daily  pravastatin (PRAVACHOL) tablet, 40 mg, Oral, QPM  ticagrelor (BRILINTA) tablet, 90 mg, Oral, 2x/day  vancomycin (VANCOCIN) 1,750 mg in NS 500 mL IVPB, 25 mg/kg (Adjusted), Intravenous, Once  Vancomycin IV - Pharmacist to Dose per Protocol, , Does not apply, Daily PRN      Allergies   Allergen Reactions    Lisinopril Swelling     Tongue and throat swelling    Cardene [Nicardipine]  Other Adverse  Reaction (Add comment)     Chest pain     Oxycodone Itching       ROS:   Constitutional: negative for fevers, chills, sweats, and fatigue  Eyes: negative for visual disturbance, irritation, redness, and icterus  Ears, nose, mouth, throat, and face: negative for hearing loss, tinnitus, ear drainage, nasal congestion, epistaxis, and sore throat  Respiratory: negative for cough, sputum, hemoptysis, wheezing, or dyspnea on exertion  Cardiovascular: negative for chest pain, palpitations, , orthopnea, paroxysmal nocturnal dyspnea, and lower extremity edema  Gastrointestinal: negative for  nausea, vomiting, melena, diarrhea, constipation, and abdominal pain  Genitourinary:negative for frequency, dysuria, nocturia, urinary incontinence, hesitancy, and hematuria  Integument/breast: negative for rash, skin lesion(s), and pruritus  Hematologic/lymphatic: negative for easy bruising, bleeding, and petechiae  Musculoskeletal:negative for myalgias, arthralgias, neck pain, back pain, and muscle weakness  Neurological: negative for headaches, dizziness, seizures, speech problems, tremor, and weakness  Behavioral/Psych: negative for anxiety, behavior problems, mood swings, and sleep disturbance  Endocrine: negative for temperature intolerance  Allergic/Immunologic: negative for urticaria and angioedema      EXAM:  Filed Vitals:    01/30/22 1140 01/30/22 1245 01/30/22 1454 01/30/22 1546   BP: (!) 168/107 (!) 178/109 (!) 168/98 (!) 142/106   Pulse: 99 88 88 93   Resp: 16 18 (!) 23    Temp: 37.1 C (98.8 F)      SpO2: 93%  99%        Constitutional: Alert and Oriented time person and place . Not in acute distress  HEENT : Vision and hearing grossly normal   Eyes: Conjunctiva clear., Pupils equal and round, reactive to light and accomodation. , Sclera non-icteric.   ENT: Nose without erythema. , Mouth mucous membranes moist.   Neck: JVD negative no thyromegaly or lymphadenopathy and supple, symmetrical, trachea midline  Respiratory:  Clear to auscultation bilaterally. No wheezes, No rales, Good air exchange bilaterally  Cardiovascular: regular rate and rhythm no murmurs no rub  Gastrointestinal: Soft, non-tender, Bowel sounds normal, No hepatosplenomegaly  Genitourinary: no suprapubic tenderness  Lower Extremities : No edema . Pulses + 4 bilateral   Neurologic: Grossly normal. Speech clear.   Psychiatric: Normal mood and affect,   Labs:         CBC Results Differential Results   Recent Labs     01/30/22  1206   WBC 14.3*   HGB 8.5*   HCT 27.4*    Recent Labs  01/30/22  1206   PMNS 86*   LYMPHOCYTES 3*   MONOCYTES 9   EOSINOPHIL 1   BASOPHILS 0  0.00   PMNABS 12.40*   LYMPHSABS 0.50*   MONOSABS 1.30*   EOSABS 0.20      BMP Results Other Chemistries Results   Recent Labs     01/30/22  1205   SODIUM 137   POTASSIUM 3.7   CHLORIDE 99   CO2 23   BUN 24   CREATININE 6.96*   GFR 7*   ANIONGAP 15*    Recent Labs     01/30/22  1205   CALCIUM 9.1   ALBUMIN 4.0      Liver/Pancreas Enzyme Results Blood Gas     Recent Labs     01/30/22  1205   TOTALPROTEIN 7.1   ALBUMIN 4.0   AST 15   ALT 9   ALKPHOS 70    No results found for this encounter   Cardiac Results    Coags Results     TROPONIN I  Lab Results   Component Value Date    UHCEASTTROPI 0.06 ([place]) 12/13/2020    TROPONINI 2,226 ([place]) 01/30/2022    TROPONINI 832 (H) 01/22/2022    TROPONINI 812 (H) 01/22/2022         No results for input(s): "INR" in the last 72 hours.    Invalid input(s): "PTT", "PT"       Imaging Studies:    XR AP MOBILE CHEST   Final Result   Bilateral fairly diffuse alveolar infiltrates are again noted and appear somewhat worse than on the last examination.         Radiologist location ID: [id number]              Assessment/Plan:  Active Hospital Problems    Diagnosis    Primary Problem: Chest pain    Elevated troponin    Ischemic cardiomyopathy    ESRD (end stage renal disease) (CMS HCC)    Hypertensive urgency    Fluid overload      End-stage renal disease hemodialysis 3 times a  week and as needed will be dialyzed today for fluid overload continue dialysis Tuesday Thursday Saturday evaluate her for need for dialysis on a daily basis ultrafiltration 3 L today during dialysis  Anemia chronic kidney disease treatment per protocol  Secondary hyperparathyroidism on calcium Rocaltrol  Ischemic cardiomyopathy will dialyze her today with ultrafiltration 3  Fluid overload dialysis today ultrafiltration 3 L  Coronary artery disease treatment per medical team and Cardiology      Richardean Sale, MD

## 2022-01-30 NOTE — Care Plan (Signed)
Patient admitted to floor from ED. No acute signs of distress noted at this time. Patient denies chest pain, vitals are stable. Patient's call bell within reach along with other persona; items. Currently on 4L NC.

## 2022-01-30 NOTE — Nurses Notes (Signed)
Dressing changed 01/28/2022 at previous hospitalization. C/D/I.

## 2022-01-30 NOTE — ED Nurses Note (Signed)
Patient received back from dialysis at this time.

## 2022-01-30 NOTE — ED Triage Notes (Signed)
Chest pain, shortness of breath since this morning, worsening. Last dialysis treatment was yesterday with full treatment. Pt was discharged from [facility] last night following heart cath with stent x1    BWVRS: 12 lead, aspirin 324mg , NTG x4, attempted iv, 6L NC

## 2022-01-30 NOTE — ED Provider Notes (Signed)
Emergency Medicine    Name: [name]  Age and Gender: 46 y.o. female  Date of Birth: 04-24-1975  MRN: [id number]  PCP: Cam Hai, FNP    CC:  Chief Complaint   Patient presents with    Chest Pain        HPI:  [name] is a 46 y.o. 50 American female with history of chest pain, dyspnea and renal failure. She was discharged yesterday from [facility] where she was admitted for NSTEMI. She reports having a cath showing significant CAD.  She has severe underlying coronary disease as well as chronic renal failure.    She reports that she has been having dialysis daily, and had a full treatment yesterday.      [place] Pain Rating Scale     On a scale of 0-10, during the past 24 hours, pain has interfered with you usual activity:       On a scale of 0-10, during the past 24 hours, pain has interfered with your sleep:      On a scale of 0-10, during the past 24 hours, pain has affected your mood:       On a scale of 0-10, during the past 24 hours, pain has contributed to your stress:       On a scale of 0-10, what is your overall pain Rating: 10        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    Tricuspid valve regurgitation     Uncontrolled hypertension            Allergies   Allergen Reactions    Lisinopril Swelling     Tongue and throat swelling    Cardene [Nicardipine]  Other Adverse Reaction (Add comment)     Chest pain     Oxycodone Itching       Past Surgical History:   Procedure Laterality Date    CORONARY ARTERY ANGIOPLASTY       ESOPHAGOGASTRODUODENOSCOPY      HX BACK SURGERY      HX CHOLECYSTECTOMY      HX FOOT SURGERY Right     HX HYSTERECTOMY      HX TONSILLECTOMY             Social History        Objective:    ED Triage Vitals [01/30/22 1140]   BP (Non-Invasive) (!) 168/107   Heart Rate 99   Respiratory Rate 16   Temperature 37.1 C (98.8 F)   SpO2 93 %   Weight 84.5 kg (186 lb 4.6 oz)   Height 1.753 m (5\' 9" )     Filed Vitals:    01/30/22 1140 01/30/22 1245   BP: (!) 168/107 (!) 178/109   Pulse: 99 88   Resp: 16 18   Temp: 37.1 C (98.8 F)  SpO2: 93%        Nursing notes and vital signs reviewed.    Constitutional - No acute distress.  Alert and Active.  HEENT - Normocephalic. Atraumatic. PERRL. EOMI. Conjunctiva clear. Oropharynx with no erythema, lesions, or exudates. Moist mucous membranes.   Neck - Trachea midline. No stridor. No hoarseness.  Cardiac - Regular rate and rhythm. No murmurs, rubs, or gallops.  Respiratory - Clear to auscultation bilaterally. No rales, wheezes or rhonchi.  Abdomen - Non-tender, soft, non-distended. No rebound or guarding.   Musculoskeletal - Good AROM. No muscle or joint tenderness appreciated. No clubbing, cyanosis or edema.  Skin - Warm and dry, without any rashes or other lesions.  Neuro - Cranial nerves II-XII are grossly intact.  Moving all extremities symmetrically.    Any pertinent labs and imaging obtained during this encounter reviewed below in MDM.    MDM/ED Course:    EKG shows a sinus rhythm, rate of 96. PR 204, QRS 110. No STEMI.  Lateral ST/T changes.  Lafb.    Her chest xray is consistent with pulmonary edema. I spoke with Dr. Darius Bump, nephrology, who knows her and believes today's event may be related to a cardiac event rather than her renal failure. He asked that I call cardiology. He will dialyze her if she is admitted.    I discussed her care with Dr. Gates Rigg, cardiology at [facility].  I discussed her troponin of over 2000 and Dr. Gates Rigg felt that was to be expected. She  is supposed to be on Brilinta only but I have learned that she hasn't filled it yet.    Dr Gates Rigg thinks it's reasonable to admit her for dialysis. He states that he did put a stent in her LAD on this recent admission but that she will need other work done if she remains compliant.     She received nitropaste, aspirin and later Brilinta.  Dr. Dewayne Hatch, hospitalist, is consulted for admission.      Medical Decision Making  Amount and/or Complexity of Data Reviewed  Labs: ordered. Decision-making details documented in ED Course.  Radiology: ordered. Decision-making details documented in ED Course.  ECG/medicine tests: ordered. Decision-making details documented in ED Course.    Risk  Prescription drug management.  Decision regarding hospitalization.             Orders Placed This Encounter    XR AP MOBILE CHEST    CBC/DIFF    COMPREHENSIVE METABOLIC PANEL, NON-FASTING    TROPONIN-I NOW    TROPONIN-I IN ONE HOUR    TROPONIN-I IN THREE HOOURS    CBC WITH DIFF    OXYGEN - NASAL CANNULA    ECG 12 LEAD    INSERT & MAINTAIN PERIPHERAL IV ACCESS    nitroGLYCERIN (NITRO-BID) 2 % topical ointment    aspirin chewable tablet 324 mg    ticagrelor (BRILINTA) tablet           Impression:   Clinical Impression   Chest pain, unspecified type (Primary)   Pulmonary edema   Elevated troponin   Chronic kidney disease with end stage renal failure on dialysis (CMS [facility])       Disposition: Admitted          Portions of this note may have been dictated using voice recognition software.     Cruzita Lederer, MD  [facility] ED    -----------------------  Results for orders placed or performed during the hospital encounter of 01/30/22 (from the  past 12 hour(s))   COMPREHENSIVE METABOLIC PANEL, NON-FASTING   Result Value Ref Range    SODIUM 137 136 - 145 mmol/L    POTASSIUM 3.7 3.5 - 5.1 mmol/L    CHLORIDE 99 98 - 107 mmol/L    CO2 TOTAL 23 21 - 31 mmol/L    ANION GAP 15 (H) 4 - 13 mmol/L    BUN 24 7 - 25 mg/dL    CREATININE 6.96 (H) 0.60  - 1.30 mg/dL    BUN/CREA RATIO 3 (L) 6 - 22    ESTIMATED GFR 7 (L) >59 mL/min/1.71m^2    ALBUMIN 4.0 3.5 - 5.7 g/dL    CALCIUM 9.1 8.6 - 10.3 mg/dL    GLUCOSE 84 74 - 109 mg/dL    ALKALINE PHOSPHATASE 70 34 - 104 U/L    ALT (SGPT) 9 7 - 52 U/L    AST (SGOT) 15 13 - 39 U/L    BILIRUBIN TOTAL 0.9 0.3 - 1.2 mg/dL    PROTEIN TOTAL 7.1 6.4 - 8.9 g/dL    ALBUMIN/GLOBULIN RATIO 1.3 0.8 - 1.4    OSMOLALITY, CALCULATED 277 270 - 290 mOsm/kg    CALCIUM, CORRECTED 9.1 8.9 - 10.8 mg/dL    GLOBULIN 3.1 2.9 - 5.4   TROPONIN-I NOW   Result Value Ref Range    TROPONIN I 2,226 (HH) <15 ng/L   CBC WITH DIFF   Result Value Ref Range    WBC 14.3 (H) 3.8 - 11.8 x10^3/uL    RBC 3.04 (L) 3.63 - 4.92 x10^6/uL    HGB 8.5 (L) 10.9 - 14.3 g/dL    HCT 27.4 (L) 31.2 - 41.9 %    MCV 90.1 75.5 - 95.3 fL    MCH 28.0 24.7 - 32.8 pg    MCHC 31.1 (L) 32.3 - 35.6 g/dL    RDW 22.1 (H) 12.3 - 17.7 %    PLATELETS 285 140 - 440 x10^3/uL    MPV 7.1 (L) 7.9 - 10.8 fL    NEUTROPHIL % 86 (H) 43 - 77 %    LYMPHOCYTE % 3 (L) 16 - 44 %    MONOCYTE % 9 5 - 13 %    EOSINOPHIL % 1 %    BASOPHIL % 0 0 - 1 %    NEUTROPHIL # 12.40 (H) 1.85 - 7.80 x10^3/uL    LYMPHOCYTE # 0.50 (L) 1.00 - 3.00 x10^3/uL    MONOCYTE # 1.30 (H) 0.30 - 1.00 x10^3/uL    EOSINOPHIL # 0.20 0.00 - 0.50 x10^3/uL    BASOPHIL # 0.00 0.00 - 0.10 x10^3/uL    RBC COMMENT Moderate Hypochromia, Few Burr Cells     PLATELET COMMENT Normal      XR AP MOBILE CHEST   Final Result   Bilateral fairly diffuse alveolar infiltrates are again noted and appear somewhat worse than on the last examination.         Radiologist location ID: [id number]

## 2022-01-30 NOTE — ED Nurses Note (Signed)
Pt left department via stretcher to dialysis at this time.

## 2022-01-30 NOTE — ED Nurses Note (Signed)
Report called to [place] on 3W at this time.

## 2022-01-30 NOTE — ED Nurses Note (Signed)
Patient transported to 3W via stretcher at this time.

## 2022-01-30 NOTE — ED Nurses Note (Signed)
Received report from [place], Therapist, sports. Care endorsed.

## 2022-01-31 DIAGNOSIS — I255 Ischemic cardiomyopathy: Secondary | ICD-10-CM

## 2022-01-31 DIAGNOSIS — G4733 Obstructive sleep apnea (adult) (pediatric): Secondary | ICD-10-CM

## 2022-01-31 DIAGNOSIS — Z86718 Personal history of other venous thrombosis and embolism: Secondary | ICD-10-CM

## 2022-01-31 DIAGNOSIS — R778 Other specified abnormalities of plasma proteins: Secondary | ICD-10-CM

## 2022-01-31 DIAGNOSIS — N186 End stage renal disease: Secondary | ICD-10-CM

## 2022-01-31 DIAGNOSIS — I132 Hypertensive heart and chronic kidney disease with heart failure and with stage 5 chronic kidney disease, or end stage renal disease: Principal | ICD-10-CM

## 2022-01-31 DIAGNOSIS — Z7902 Long term (current) use of antithrombotics/antiplatelets: Secondary | ICD-10-CM

## 2022-01-31 DIAGNOSIS — J9621 Acute and chronic respiratory failure with hypoxia: Secondary | ICD-10-CM

## 2022-01-31 DIAGNOSIS — I25118 Atherosclerotic heart disease of native coronary artery with other forms of angina pectoris: Secondary | ICD-10-CM

## 2022-01-31 DIAGNOSIS — Z9981 Dependence on supplemental oxygen: Secondary | ICD-10-CM

## 2022-01-31 DIAGNOSIS — I160 Hypertensive urgency: Secondary | ICD-10-CM

## 2022-01-31 DIAGNOSIS — J4489 Other specified chronic obstructive pulmonary disease: Secondary | ICD-10-CM

## 2022-01-31 DIAGNOSIS — Z992 Dependence on renal dialysis: Secondary | ICD-10-CM

## 2022-01-31 DIAGNOSIS — J811 Chronic pulmonary edema: Secondary | ICD-10-CM

## 2022-01-31 DIAGNOSIS — J189 Pneumonia, unspecified organism: Secondary | ICD-10-CM

## 2022-01-31 DIAGNOSIS — R7989 Other specified abnormal findings of blood chemistry: Secondary | ICD-10-CM

## 2022-01-31 DIAGNOSIS — E8770 Fluid overload, unspecified: Secondary | ICD-10-CM

## 2022-01-31 LAB — MAGNESIUM: MAGNESIUM: 1.9 mg/dL (ref 1.9–2.7)

## 2022-01-31 LAB — BASIC METABOLIC PANEL
ANION GAP: 11 mmol/L (ref 4–13)
BUN/CREA RATIO: 3 — ABNORMAL LOW (ref 6–22)
BUN: 13 mg/dL (ref 7–25)
CALCIUM: 9.3 mg/dL (ref 8.6–10.3)
CHLORIDE: 101 mmol/L (ref 98–107)
CO2 TOTAL: 26 mmol/L (ref 21–31)
CREATININE: 3.78 mg/dL — ABNORMAL HIGH (ref 0.60–1.30)
ESTIMATED GFR: 14 mL/min/{1.73_m2} — ABNORMAL LOW (ref >59)
GLUCOSE: 124 mg/dL — ABNORMAL HIGH (ref 74–109)
OSMOLALITY, CALCULATED: 277 mOsm/kg (ref 270–290)
POTASSIUM: 3.4 mmol/L — ABNORMAL LOW (ref 3.5–5.1)
SODIUM: 138 mmol/L (ref 136–145)

## 2022-01-31 LAB — CBC WITH DIFF
BASOPHIL #: 0.1 10*3/uL (ref 0.00–0.10)
BASOPHIL %: 1 % (ref 0–1)
EOSINOPHIL #: 0.2 10*3/uL (ref 0.00–0.50)
EOSINOPHIL %: 2 %
HCT: 28.6 % — ABNORMAL LOW (ref 31.2–41.9)
HGB: 9.1 g/dL — ABNORMAL LOW (ref 10.9–14.3)
LYMPHOCYTE #: 0.9 10*3/uL — ABNORMAL LOW (ref 1.00–3.00)
LYMPHOCYTE %: 9 % — ABNORMAL LOW (ref 16–44)
MCH: 28.4 pg (ref 24.7–32.8)
MCHC: 31.8 g/dL — ABNORMAL LOW (ref 32.3–35.6)
MCV: 89.4 fL (ref 75.5–95.3)
MONOCYTE #: 1 10*3/uL (ref 0.30–1.00)
MONOCYTE %: 10 % (ref 5–13)
MPV: 7.1 fL — ABNORMAL LOW (ref 7.9–10.8)
NEUTROPHIL #: 7.9 10*3/uL — ABNORMAL HIGH (ref 1.85–7.80)
NEUTROPHIL %: 78 % — ABNORMAL HIGH (ref 43–77)
PLATELET COMMENT: NORMAL
PLATELETS: 293 10*3/uL (ref 140–440)
RBC: 3.2 10*6/uL — ABNORMAL LOW (ref 3.63–4.92)
RDW: 22.4 % — ABNORMAL HIGH (ref 12.3–17.7)
WBC: 10.1 10*3/uL (ref 3.8–11.8)

## 2022-01-31 LAB — COVID-19, FLU A/B, RSV RAPID BY PCR
INFLUENZA VIRUS TYPE A: NOT DETECTED
INFLUENZA VIRUS TYPE B: NOT DETECTED
RESPIRATORY SYNCTIAL VIRUS (RSV): NOT DETECTED
SARS-CoV-2: NOT DETECTED

## 2022-01-31 MED ORDER — RANOLAZINE ER 500 MG TABLET,EXTENDED RELEASE,12 HR
500.0000 mg | ORAL_TABLET | Freq: Two times a day (BID) | ORAL | Status: DC
Start: 2022-01-31 — End: 2022-02-02
  Administered 2022-01-31 – 2022-02-02 (×4): 500 mg via ORAL
  Filled 2022-01-31 (×4): qty 1

## 2022-01-31 MED ORDER — HEPARIN (PORCINE) 5,000 UNIT/ML INJECTION SOLUTION
5000.0000 [IU] | Freq: Three times a day (TID) | INTRAMUSCULAR | Status: DC
Start: 2022-01-31 — End: 2022-02-02
  Administered 2022-01-31 – 2022-02-01 (×4): 5000 [IU] via SUBCUTANEOUS
  Administered 2022-02-01: 0 [IU] via SUBCUTANEOUS
  Administered 2022-02-02: 5000 [IU] via SUBCUTANEOUS
  Filled 2022-01-31 (×6): qty 1

## 2022-01-31 MED ORDER — EPOETIN ALFA-EPBX 10,000 UNIT/ML INJECTION SOLUTION
10000.0000 [IU] | INTRAMUSCULAR | Status: AC
Start: 2022-01-31 — End: 2022-01-31
  Administered 2022-01-31: 10000 [IU] via INTRAVENOUS
  Filled 2022-01-31: qty 1

## 2022-01-31 MED ORDER — FOLIC ACID 1 MG TABLET
1.0000 mg | ORAL_TABLET | Freq: Every day | ORAL | Status: DC
Start: 2022-01-31 — End: 2022-02-02
  Administered 2022-01-31 – 2022-02-02 (×3): 1 mg via ORAL
  Filled 2022-01-31 (×3): qty 1

## 2022-01-31 MED ORDER — ETHYL ALCOHOL 62 % (NOZIN NASAL SANITIZER) NASAL SOLUTION - BULK BOTTLE
1.0000 | Freq: Two times a day (BID) | NASAL | Status: DC
Start: 2022-01-31 — End: 2022-02-02
  Administered 2022-01-31 – 2022-02-01 (×4): 1 via NASAL
  Administered 2022-02-02: 0 via NASAL

## 2022-01-31 NOTE — Telemedicine Consult (Signed)
[facility]  Cardiology   Consult    [place], [place], 46 y.o. female  Encounter Start Date:  01/30/2022  Inpatient Admission Date: 01/30/2022   Date of Service: 01/31/2022  Admission Source: Home  Date of Birth:  03-16-1975  PCP:  Cam Hai, FNP    Information Obtained from: patient and history reviewed via medical record  Chief Complaint:  chest pain  Reason for Consult:  Chest pain, CAD    Impression/Assessment/Recommendations:  Recent discharge from [facility] - had LHC/PCI to LAD   # Hypervolemia 2/2 under-dialysis   # mvCAD s/p PCI multiple times & centers w/most at [facility] (previous at [facility])   # 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 Lucio Edward       HPI:  [name] is a 46 y.o. female who presented with chest pain and shortness of breath.  The patient was discharged from [facility], yesterday.  During her hospitalization she did have a cardiac catheterization with LAD stenting by Dr. Gates Rigg. She was found to have severe multivessel CAD, reportedly not fit candidate for CABG, evaluated at [facility] in the past.  Does have a history of cardiomyopathy.  Echo from November 2023 showed an EF of 55-60% Patient was also treated for hypertensive urgency with volume overload with CHF.  She does have end-stage renal disease.  States that she takes dialysis daily.  Reports that they have been doing it daily so they do not have to pull as much fluid off at 1 time.  Chest x-ray showed bilateral diffuse alveolar infiltrates, appear somewhat worse than the last exam.  Troponin greater than 2000, however the interventional cardiologist, Dr Gates Rigg was notified of this finding, stated that this was to be expected post catheterization.  Patient is complaining of a midsternal chest pain that radiates to the left side.  States  that it started this morning, Patient was also treated for pneumonia while at [facility].  She did complete a full session of dialysis yesterday.  Blood pressure is 170/112 in the emergency department.  WBCs 14.3 and hemoglobin 8.5.  She wears O2 4 L nasal cannula continuously at home.  Patient was started on Brilinta during her hospitalization at [facility], had not filled the prescription at discharge, but again she was just discharged last night.  Reports compliance with medications.  Take several antihypertensive medications.      01/31/2022: At times of exam pt resting in bed, no acute distress noted. She reports she is feeling some relief since having dialysis. However, she reports her chest pain/ pressure are present at all times, just variable in its severity.       Assessment:  Active Hospital Problems    Diagnosis    Primary Problem: Chest pain    Elevated troponin    Ischemic cardiomyopathy    Acute pulmonary edema (CMS HCC)    Chronic kidney disease with end stage renal failure on dialysis (CMS HCC)    ESRD (end stage renal disease) (CMS HCC)    Hypertensive urgency    Fluid overload      Past Medical History:   Diagnosis Date    Asthma     Chronic diastolic CHF (congestive heart failure) (CMS HCC)     Dependence on supplemental oxygen     Esophageal  reflux     ESRD (end stage renal disease) (CMS HCC)     History of anemia due to CKD     MI (myocardial infarction) (CMS HCC)     Mitral valve regurgitation     Pulmonary edema     Sleep apnea     SVC syndrome     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    Tricuspid valve regurgitation     Uncontrolled hypertension          Past Surgical History:   Procedure Laterality Date    CORONARY ARTERY ANGIOPLASTY      ESOPHAGOGASTRODUODENOSCOPY      HX BACK SURGERY      HX CHOLECYSTECTOMY      HX FOOT SURGERY Right     HX HYSTERECTOMY      HX TONSILLECTOMY           Social History     Socioeconomic History    Marital status: Divorced      Spouse name: Not on file    Number of children: Not on file    Years of education: Not on file    Highest education level: Not on file   Occupational History    Not on file   Tobacco Use    Smoking status: Every Day     Packs/day: 1.00     Years: 10.00     Additional pack years: 0.00     Total pack years: 10.00     Types: Cigarettes     Passive exposure: Past    Smokeless tobacco: Former    Tobacco comments:     "Haven't had a cigerette in 2 weeks."   Vaping Use    Vaping Use: Never used   Substance and Sexual Activity    Alcohol use: Not Currently    Drug use: Yes     Types: Marijuana    Sexual activity: Not Currently   Other Topics Concern    Not on file   Social History Narrative    Not on file     Social Determinants of Health     Financial Resource Strain: Not on file   Transportation Needs: Not on file   Social Connections: Medium Risk (01/30/2022)    Social Connections     SDOH Social Isolation: 1 or 2 times a week   Intimate Partner Violence: Not on file   Housing Stability: Not on file      Family Medical History:       Problem Relation (Age of Onset)    Breast Cancer Mother    Coronary Artery Disease Mother    Diabetes type II Mother    Hypertension (High Blood Pressure) Father             Prior to Admission Medications:  Medications Prior to Admission       Prescriptions    amLODIPine (NORVASC) 10 mg Oral Tablet    Take 1 Tablet (10 mg total) by mouth Once a day Take one tablet every day    busPIRone (BUSPAR) 10 mg Oral Tablet    Take 1 Tablet (10 mg total) by mouth Three times a day    cloNIDine HCL (CATAPRES) 0.2 mg Oral Tablet    Take 1 Tablet (0.2 mg total) by mouth Twice daily Take one tablet twice a day    doxazosin (CARDURA) 4 mg Oral Tablet    Take 1 Tablet (4 mg total) by  mouth Every evening    labetaloL (NORMODYNE) 200 mg Oral Tablet    Take 4 Tablets (800 mg total) by mouth Three times a day    nitroGLYCERIN (NITROSTAT) 0.4 mg Sublingual Tablet, Sublingual    Place 1 Tablet (0.4 mg total) under  the tongue Every 5 minutes as needed for Chest pain for up to 3 doses for 3 doses over 15 minutes    omeprazole (PRILOSEC) 40 mg Oral Capsule, Delayed Release(E.C.)    Take 1 Capsule (40 mg total) by mouth Once a day    rosuvastatin (CRESTOR) 40 mg Oral Tablet    Take 1 Tablet (40 mg total) by mouth Every evening    ticagrelor (BRILINTA) 90 mg Oral Tablet    Take 1 Tablet (90 mg total) by mouth Twice daily          Allergies   Allergen Reactions    Lisinopril Swelling     Tongue and throat swelling    Cardene [Nicardipine]  Other Adverse Reaction (Add comment)     Chest pain     Oxycodone Itching     ROS: Other than ROS in the HPI, all other systems were negative.   Exam:  Temperature: 36.7 C (98.1 F)  Heart Rate: 74  BP (Non-Invasive): 127/71  Respiratory Rate: 17  SpO2: 98 %  General: No acute distress and appears stated age.  .    Neck: No JVD, no carotid bruit.    Lungs: Clear to diminished auscultation bilaterally.    Cardiovascular: Regular rate and rhythm, normal S1-S2 without murmur, gallop, or rub.    Abdomen: Soft, non-tender   Extremities: Extremities normal, atraumatic, no cyanosis or edema.    Skin: Skin warm and dry.    Neurologic: Alert and oriented x3.      Patient seen and examined by myself at [facility].  Assessment and plan discussed and determined with Dr. Gwynne Edinger via video/audio telecardiology.    I participated and evaluated the patient as part of a collaborative telemedicine service.  See Dr. Gwynne Edinger 's note for additional information.  My findings from this visit are as stated above.  The co-signing physician did not participate in the management of this patient unless otherwise noted.      Dawn Christ, FNP-C 01/31/2022 09:38         I participated and evaluated the patient as part of a collaborative telemedicine service performed via video from [place].  The patient was seen and evaluated together with the mid-level provider within [facility]. I agree with the  documentation above. My findings from this visit are below, see [name]'s note for additional information.  The co-signing physician did not participate in the management of this patient unless otherwise noted.      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 Haze Boyden, DO, [facility]  Assistant Professor of Medicine  Advanced Heart Failure and Transplant Cardiology  [facility]  [facility]

## 2022-01-31 NOTE — Progress Notes (Signed)
[facility]  IP PROGRESS NOTE      [name], [name]  Date of Admission:  01/30/2022  Date of Birth:  [birth year]  Date of Service:  01/31/2022    Hospital Day:  LOS: 1 day     Subjective:   Patient seen examined for follow-up of acute on chronic respiratory failure with hypoxia,  suspected HCAP vs fluid overload,  end-stage renal disease on hemodialysis, chest pain, elevated troponins, recent heart catheterization.    Vital Signs:  Temp (24hrs) Max:37.1 C (98.8 F)      Temperature: 36.7 C (98.1 F)  BP (Non-Invasive): 127/71  MAP (Non-Invasive): 84 mmHG  Heart Rate: 80  Respiratory Rate: 17  SpO2: 98 %    Current Medications:  acetaminophen (TYLENOL) tablet, 650 mg, Oral, Q4H PRN  alcohol 62 % (NOZIN NASAL SANITIZER) nasal solution, 1 Each, Each Nostril, 2x/day  amLODIPine (NORVASC) tablet, 10 mg, Oral, Daily  aspirin chewable tablet 324 mg, 324 mg, Oral, Now  busPIRone (BUSPAR) tablet, 10 mg, Oral, 3x/day  cefepime (MAXIPIME) 500 mg in NS 50 mL IVPB, 500 mg, Intravenous, Q24H  cloNIDine (CATAPRES) tablet, 0.2 mg, Oral, 2x/day  doxazosin (CARDURA) tablet, 4 mg, Oral, QPM  folic acid (FOLVITE) tablet, 1 mg, Oral, Daily  HYDROmorphone (DILAUDID) 2 mg/mL injection, 0.2 mg, Intravenous, Q4H PRN  labetalol (NORMODYNE) tablet, 800 mg, Oral, 3x/day  nitroGLYCERIN (NITROSTAT) sublingual tablet, 0.4 mg, Sublingual, Q5 Min PRN  ondansetron (ZOFRAN) 2 mg/mL injection, 4 mg, Intravenous, Q6H PRN  pantoprazole (PROTONIX) delayed release tablet, 40 mg, Oral, Daily  pravastatin (PRAVACHOL) tablet, 40 mg, Oral, QPM  ticagrelor (BRILINTA) tablet, 90 mg, Oral, 2x/day  Vancomycin IV - Pharmacist to Dose per Protocol, , Does not apply, Daily PRN        Current Orders:  Active Orders   Microbiology    ADULT ROUTINE BLOOD CULTURE, SET OF 2 BOTTLES (BACTERIA AND YEAST)     Frequency: ONE TIME     Number of Occurrences: 1 Occurrences    ADULT ROUTINE BLOOD CULTURE, SET OF 2  BOTTLES (BACTERIA AND YEAST)     Frequency: ONE TIME     Number of Occurrences: 1 Occurrences    RESPIRATORY CULTURE AND GRAM STAIN, AEROBIC     Frequency: ONE TIME     Number of Occurrences: 1 Occurrences    SPUTUM SCREEN     Frequency: Once     Number of Occurrences: 1 Occurrences   Lab    BASIC METABOLIC PANEL, NON-FASTING     Frequency: 0530 - AM DRAW     Number of Occurrences: 1 Occurrences    CBC WITH DIFF     Frequency: Once     Number of Occurrences: 1 Occurrences    CBC/DIFF     Frequency: 0530 - AM DRAW     Number of Occurrences: 1 Occurrences    COVID-19, FLU A/B, RSV RAPID BY PCR     Frequency: ONE TIME     Number of Occurrences: 1 Occurrences    MAGNESIUM     Frequency: 0530 - AM DRAW     Number of Occurrences: 1 Occurrences    VANCOMYCIN, TROUGH     Frequency: 0530 - AM DRAW     Number of Occurrences: 1 Occurrences   Diet    DIET CARDIAC (2G NA, LOWFAT, LOW CHOL) Do you want to initiate MNT Protocol? Yes     Frequency: All Meals     Number  of Occurrences: 1 Occurrences   Nursing    ACTIVITY Activity: AS TOLERATED; Instructions: WITH ASSIST     Frequency: UNTIL DISCONTINUED     Number of Occurrences: Until Specified    CONTINUOUS CARDIAC MONITORING (ED USE ONLY)     Frequency: CONTINUOUS     Number of Occurrences: Until Specified    Notify MD Vital Signs     Frequency: PRN     Number of Occurrences: Until Specified    PT IS INTERMEDIATE RISK FOR VENOUS THROMBOEMBOLISM     Frequency: CONTINUOUS     Number of Occurrences: Until Specified    PULSE OXIMETRY CONTINUOUS     Frequency: CONTINUOUS     Number of Occurrences: Until Specified    PULSE OXIMETRY Q4H     Frequency: Q4H     Number of Occurrences: Until Specified    TELEMETRY MONITORING - Continuous     Frequency: CONTINUOUS     Number of Occurrences: Until Specified    VITAL SIGNS  Q4H     Frequency: Q4H     Number of Occurrences: Until Specified    WEIGH PATIENT PRE AND POST DIALYSIS TREATMENT     Frequency: EVERY OTHER DAY     Number of Occurrences:  Until Specified   Code Status    FULL CODE     Frequency: CONTINUOUS     Number of Occurrences: Until Specified   Consult    IP CONSULT TO CARDIOLOGY Requested Provider; Yisroel Ramming     Frequency: ONE TIME     Number of Occurrences: 1 Occurrences    IP CONSULT TO NEPHROLOGY Requested Provider; Darius Bump, ABBAS; 01/30/2022     Frequency: ONE TIME     Number of Occurrences: 1 Occurrences    IP CONSULT TO PALLIATIVE CARE     Frequency: ONE TIME     Number of Occurrences: 1 Occurrences   Respiratory Care    BIPAP 4HR ON/4HR OFF & QHS (Non-ICU Only)     Frequency: 4HR ON/4HR OFF & QHS (Non-ICU Only)     Number of Occurrences: 3 Days    OXYGEN - NASAL CANNULA     Frequency: PRN     Number of Occurrences: Until Specified     Order Comments: Maintain oxygen saturation greater than 95%      OXYGEN - NASAL CANNULA     Frequency: CONTINUOUS     Number of Occurrences: Until Specified     Order Comments: Flowrate should not exeed 6L/min except [facility] facility.          IV    INSERT & MAINTAIN PERIPHERAL IV ACCESS     Frequency: UNTIL DISCONTINUED     Number of Occurrences: Until Specified   Dialysis    HEMODIALYSIS     Frequency: ONE TIME     Number of Occurrences: 1 Occurrences    HEMODIALYSIS     Frequency: ONE TIME     Number of Occurrences: 1 Occurrences     Order Comments: Remove 1-3L as tolerated to maintain SBP >90.     Medications    acetaminophen (TYLENOL) tablet     Frequency: Q4H PRN     Dose: 650 mg     Route: Oral    alcohol 62 % (NOZIN NASAL SANITIZER) nasal solution     Frequency: 2x/day     Dose: 1 Each     Route: Each Nostril    amLODIPine (NORVASC) tablet     Frequency: Daily  Dose: 10 mg     Route: Oral    aspirin chewable tablet 324 mg     Frequency: Now     Dose: 324 mg     Route: Oral    busPIRone (BUSPAR) tablet     Frequency: 3x/day     Dose: 10 mg     Route: Oral    cefepime (MAXIPIME) 500 mg in NS 50 mL IVPB     Frequency: Q24H     Dose: 500 mg     Route: Intravenous    cloNIDine (CATAPRES) tablet      Frequency: 2x/day     Dose: 0.2 mg     Route: Oral    doxazosin (CARDURA) tablet     Frequency: QPM     Dose: 4 mg     Route: Oral    folic acid (FOLVITE) tablet     Frequency: Daily     Dose: 1 mg     Route: Oral    HYDROmorphone (DILAUDID) 2 mg/mL injection     Frequency: Q4H PRN     Dose: 0.2 mg     Route: Intravenous    labetalol (NORMODYNE) tablet     Frequency: 3x/day     Dose: 800 mg     Route: Oral    nitroGLYCERIN (NITROSTAT) sublingual tablet     Frequency: Q5 Min PRN     Dose: 0.4 mg     Route: Sublingual    ondansetron (ZOFRAN) 2 mg/mL injection     Frequency: Q6H PRN     Dose: 4 mg     Route: Intravenous    pantoprazole (PROTONIX) delayed release tablet     Frequency: Daily     Dose: 40 mg     Route: Oral    pravastatin (PRAVACHOL) tablet     Frequency: QPM     Dose: 40 mg     Route: Oral    ticagrelor (BRILINTA) tablet     Frequency: 2x/day     Dose: 90 mg     Route: Oral    Vancomycin IV - Pharmacist to Dose per Protocol     Frequency: Daily PRN     Route: Does not apply        Review of Systems:  Focused review of system was completed. Refer to the HPI for ROS details.     Today's Physical Exam:  Physical Exam  Constitutional:       Appearance: Normal appearance.   Cardiovascular:      Rate and Rhythm: Normal rate and regular rhythm.   Pulmonary:      Effort: Pulmonary effort is normal.      Breath sounds: Normal breath sounds.   Abdominal:      General: Abdomen is flat.      Palpations: Abdomen is soft.   Skin:     General: Skin is warm and dry.   Neurological:      Mental Status: She is alert and oriented to person, place, and time.   Psychiatric:         Mood and Affect: Mood normal.       I/O:  I/O last 24 hours:    Intake/Output Summary (Last 24 hours) at 01/31/2022 1230  Last data filed at 01/30/2022 2101  Gross per 24 hour   Intake 567 ml   Output --   Net 567 ml     I/O current shift:  No intake/output data recorded.    Labs  Please indicate ordered or reviewed)  Reviewed: I have reviewed all  lab results.    Problem List:  Active Hospital Problems   (*Primary Problem)    Diagnosis    *Chest pain    Elevated troponin    Ischemic cardiomyopathy     Chronic    Acute pulmonary edema (CMS HCC)    HCAP (healthcare-associated pneumonia)    ESRD (end stage renal disease) (CMS HCC)    Hypertensive urgency    Acute on chronic respiratory failure with hypoxia (CMS HCC)    Fluid overload    End-stage renal disease on hemodialysis (CMS HCC)       Assessment/ Plan:     Acute on chronic respiratory failure hypoxia likely secondary to Hcap versus fluid overload:  Patient has been requiring up to 4 L oxygen via nasal cannula.  She is normally on 2 L at home.  Continue with IV antibiotics    End-stage renal disease on hemodialysis:  Dialysis today, nephrology following    Elevated troponin with history of recent heart catheterization: On Brilinta, tele cardiology consulted    Carron Brazen, DO      DVT/PE Prophylaxis: Heparin    Disposition Planning: Home discharge      Advance Care Planning Discussed:  No

## 2022-01-31 NOTE — Consults (Signed)
[facility]   Nephrology Consult      Arden, [name], 46 y.o. female  Encounter Start Date:  01/30/2022  Inpatient Admission Date:  01/30/2022  Date of Birth:  [birth year]    Admitting Provider:  Hospitalist service    Reason for Consult:  End-stage renal disease on hemodialysis with fluid overload    HPI:  [name] is a 46 y.o. 25 American female with history of chest pain, dyspnea and renal failure. She was discharged yesterday from [facility] where she was admitted for NSTEMI. She reports having a cath showing significant CAD.  She has severe underlying coronary disease   So this patient known to have end-stage renal disease on hemodialysis 3 times a week she is been dialyzed almost daily in [facility] as she said still short of breath fluid overloaded will dialyze her today with ultrafiltration 3 L continue to evaluate her for need for dialysis on a daily basis anemia of chronic kidney disease treatment per protocol secondary hyperparathyroidism calcium Rocaltrol  01-31-2022  Nephrology progress note on this patient known to have end-stage renal disease CHF came in with shortness of breadth chest pain fluid overload dialyzed yesterday ultrafiltration 3 L dialyzed today ultrafiltration 2-1/2 L still on oxygen will plan for dialysis tomorrow with ultrafiltration to 3 L as tolerated continue to monitor need for dialysis on a daily basis anemia of chronic kidney disease treatment per protocol secondary hyperparathyroidism calcium Rocaltrol    Past Medical History:   Diagnosis Date    Asthma     Chronic diastolic CHF (congestive heart failure) (CMS HCC)     Dependence on supplemental oxygen     Esophageal reflux     ESRD (end stage renal disease) (CMS HCC)     History of anemia due to CKD     MI (myocardial infarction) (CMS HCC)     Mitral valve regurgitation     Pulmonary edema     Sleep apnea     SVC syndrome     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    Tricuspid valve regurgitation     Uncontrolled hypertension          Medications Prior to Admission       Prescriptions    amLODIPine (NORVASC) 10 mg Oral Tablet    Take 1 Tablet (10 mg total) by mouth Once a day Take one tablet every day    busPIRone (BUSPAR) 10 mg Oral Tablet    Take 1 Tablet (10 mg total) by mouth Three times a day    cloNIDine HCL (CATAPRES) 0.2 mg Oral Tablet    Take 1 Tablet (0.2 mg total) by mouth Twice daily Take one tablet twice a day    doxazosin (CARDURA) 4 mg Oral Tablet    Take 1 Tablet (4 mg total) by mouth Every evening    labetaloL (NORMODYNE) 200 mg Oral Tablet    Take 4 Tablets (800 mg total) by mouth Three times a day    nitroGLYCERIN (NITROSTAT) 0.4 mg Sublingual Tablet, Sublingual    Place 1 Tablet (0.4 mg total) under the tongue Every 5 minutes as needed for Chest pain for up to 3 doses for 3 doses over 15 minutes    omeprazole (PRILOSEC) 40 mg Oral Capsule, Delayed Release(E.C.)    Take 1 Capsule (40 mg total) by mouth Once a day    rosuvastatin (CRESTOR) 40 mg Oral Tablet    Take  1 Tablet (40 mg total) by mouth Every evening    ticagrelor (BRILINTA) 90 mg Oral Tablet    Take 1 Tablet (90 mg total) by mouth Twice daily          acetaminophen (TYLENOL) tablet, 650 mg, Oral, Q4H PRN  alcohol 62 % (NOZIN NASAL SANITIZER) nasal solution, 1 Each, Each Nostril, 2x/day  amLODIPine (NORVASC) tablet, 10 mg, Oral, Daily  aspirin chewable tablet 324 mg, 324 mg, Oral, Now  busPIRone (BUSPAR) tablet, 10 mg, Oral, 3x/day  cefepime (MAXIPIME) 500 mg in NS 50 mL IVPB, 500 mg, Intravenous, Q24H  cloNIDine (CATAPRES) tablet, 0.2 mg, Oral, 2x/day  doxazosin (CARDURA) tablet, 4 mg, Oral, QPM  folic acid (FOLVITE) tablet, 1 mg, Oral, Daily  heparin 5,000 unit/mL injection, 5,000 Units, Subcutaneous, Q8HRS  HYDROmorphone (DILAUDID) 2 mg/mL injection, 0.2 mg, Intravenous, Q4H PRN  labetalol (NORMODYNE) tablet, 800 mg, Oral, 3x/day  nitroGLYCERIN (NITROSTAT)  sublingual tablet, 0.4 mg, Sublingual, Q5 Min PRN  ondansetron (ZOFRAN) 2 mg/mL injection, 4 mg, Intravenous, Q6H PRN  pantoprazole (PROTONIX) delayed release tablet, 40 mg, Oral, Daily  pravastatin (PRAVACHOL) tablet, 40 mg, Oral, QPM  ranolazine (RANEXA) extended release tablet, 500 mg, Oral, 2x/day  ticagrelor (BRILINTA) tablet, 90 mg, Oral, 2x/day  Vancomycin IV - Pharmacist to Dose per Protocol, , Does not apply, Daily PRN      Allergies   Allergen Reactions    Lisinopril Swelling     Tongue and throat swelling    Cardene [Nicardipine]  Other Adverse Reaction (Add comment)     Chest pain     Oxycodone Itching       ROS:   Constitutional: negative for fevers, chills, sweats, and fatigue  Eyes: negative for visual disturbance, irritation, redness, and icterus  Ears, nose, mouth, throat, and face: negative for hearing loss, tinnitus, ear drainage, nasal congestion, epistaxis, and sore throat  Respiratory: negative for cough, sputum, hemoptysis, wheezing, or dyspnea on exertion  Cardiovascular: negative for chest pain, palpitations, , orthopnea, paroxysmal nocturnal dyspnea, and lower extremity edema  Gastrointestinal: negative for  nausea, vomiting, melena, diarrhea, constipation, and abdominal pain  Genitourinary:negative for frequency, dysuria, nocturia, urinary incontinence, hesitancy, and hematuria  Integument/breast: negative for rash, skin lesion(s), and pruritus  Hematologic/lymphatic: negative for easy bruising, bleeding, and petechiae  Musculoskeletal:negative for myalgias, arthralgias, neck pain, back pain, and muscle weakness  Neurological: negative for headaches, dizziness, seizures, speech problems, tremor, and weakness  Behavioral/Psych: negative for anxiety, behavior problems, mood swings, and sleep disturbance  Endocrine: negative for temperature intolerance  Allergic/Immunologic: negative for urticaria and angioedema      EXAM:  Filed Vitals:    01/31/22 0800 01/31/22 1045 01/31/22 1147 01/31/22  1606   BP:    (!) 108/58   Pulse:  80  83   Resp: 16  16 16    Temp:    36.6 C (97.8 F)   SpO2: 98%   97%       Constitutional: Alert and Oriented time person and place . Not in acute distress  HEENT : Vision and hearing grossly normal   Eyes: Conjunctiva clear., Pupils equal and round, reactive to light and accomodation. , Sclera non-icteric.   ENT: Nose without erythema. , Mouth mucous membranes moist.   Neck: JVD negative no thyromegaly or lymphadenopathy and supple, symmetrical, trachea midline  Respiratory: Clear to auscultation bilaterally. No wheezes, No rales, Good air exchange bilaterally  Cardiovascular: regular rate and rhythm no murmurs no rub  Gastrointestinal: Soft, non-tender, Bowel sounds  normal, No hepatosplenomegaly  Genitourinary: no suprapubic tenderness  Lower Extremities : No edema . Pulses + 4 bilateral   Neurologic: Grossly normal. Speech clear.   Psychiatric: Normal mood and affect,   Labs:         CBC Results Differential Results   Recent Labs     01/30/22  1206 01/31/22  1310   WBC 14.3* 10.1   HGB 8.5* 9.1*   HCT 27.4* 28.6*    Recent Labs     01/30/22  1206 01/31/22  1310   PMNS 86* 78*   LYMPHOCYTES 3* 9*   MONOCYTES 9 10   EOSINOPHIL 1 2   BASOPHILS 0  0.00 1  0.10   PMNABS 12.40* 7.90*   LYMPHSABS 0.50* 0.90*   MONOSABS 1.30* 1.00   EOSABS 0.20 0.20      BMP Results Other Chemistries Results   Recent Labs     01/30/22  1205 01/31/22  1310   SODIUM 137 138   POTASSIUM 3.7 3.4*   CHLORIDE 99 101   CO2 23 26   BUN 24 13   CREATININE 6.96* 3.78*   GFR 7* 14*   ANIONGAP 15* 11    Recent Labs     01/30/22  1205 01/31/22  1310   CALCIUM 9.1 9.3   ALBUMIN 4.0  --    MAGNESIUM  --  1.9      Liver/Pancreas Enzyme Results Blood Gas     Recent Labs     01/30/22  1205   TOTALPROTEIN 7.1   ALBUMIN 4.0   AST 15   ALT 9   ALKPHOS 70    No results found for this encounter   Cardiac Results    Coags Results     TROPONIN I  Lab Results   Component Value Date    UHCEASTTROPI 0.06 ([place]) 12/13/2020     TROPONINI 2,728 ([place]) 01/30/2022    TROPONINI 2,783 ([place]) 01/30/2022    TROPONINI 2,472 ([place]) 01/30/2022         No results for input(s): "INR" in the last 72 hours.    Invalid input(s): "PTT", "PT"       Imaging Studies:    XR AP MOBILE CHEST   Final Result   Bilateral fairly diffuse alveolar infiltrates are again noted and appear somewhat worse than on the last examination.         Radiologist location ID: [id number]              Assessment/Plan:  Active Hospital Problems    Diagnosis    Primary Problem: Chest pain    Elevated troponin    Ischemic cardiomyopathy    Acute pulmonary edema (CMS HCC)    HCAP (healthcare-associated pneumonia)    ESRD (end stage renal disease) (CMS HCC)    Ischemic heart disease    Hypertensive urgency    Acute on chronic respiratory failure with hypoxia (CMS HCC)    Fluid overload    End-stage renal disease on hemodialysis (CMS HCC)      End-stage renal disease with fluid overload dialyzed yesterday with ultrafiltration 3 L dialyzed today ultrafiltration 2-1/2 L plan for dialysis tomorrow continue to evaluate her for need for dialysis on a daily basis  Anemia of chronic kidney disease treatment per protocol  Secondary hyperparathyroidism calcium Rocaltrol  Chest pain with recent cardiac catheterization treatment per cardiology  Pneumonia on antibiotic per medical team      Richardean Sale, MD

## 2022-01-31 NOTE — Care Plan (Signed)
Problem: Adult Inpatient Plan of Care  Goal: Patient-Specific Goal (Individualized)  Outcome: Ongoing (see interventions/notes)  Flowsheets (Taken 01/31/2022 0800)  Individualized Care Needs: Dialysis, pain management, vitals, labs  Anxieties, Fears or Concerns: Pain  Patient-Specific Goals (Include Timeframe): D/C back home  Plan of Care Reviewed With: patient   [facility] Plan Note    Situation: Patient admitted with chest pain.    Intervention: Vitals, labs, and telemetry monitored per protocol/orders. IV antibiotics administered per orders for pneumonia. Assistance with ADLs as needed. Dialysis treatment per orders. PRN pain medication and nitroglycerin ordered for management of chest pain.    Response: Patient tolerating treatment well, and agreeable to treatment. Pain controlled with PRN medications.      Creola Corn, RN

## 2022-02-01 DIAGNOSIS — J810 Acute pulmonary edema: Secondary | ICD-10-CM

## 2022-02-01 DIAGNOSIS — Z9989 Dependence on other enabling machines and devices: Secondary | ICD-10-CM

## 2022-02-01 LAB — VANCOMYCIN, TROUGH: VANCOMYCIN TROUGH: 15.2 ug/mL — ABNORMAL HIGH (ref 5.0–10.0)

## 2022-02-01 MED ORDER — RANOLAZINE ER 500 MG TABLET,EXTENDED RELEASE,12 HR
500.0000 mg | ORAL_TABLET | Freq: Two times a day (BID) | ORAL | 0 refills | Status: AC
Start: 2022-02-01 — End: 2022-03-05

## 2022-02-01 MED ORDER — VANCOMYCIN 10 GRAM INTRAVENOUS SOLUTION
1750.0000 mg | Freq: Once | INTRAVENOUS | Status: AC
Start: 2022-02-01 — End: 2022-02-01
  Administered 2022-02-01: 0 mg via INTRAVENOUS
  Administered 2022-02-01: 1750 mg via INTRAVENOUS
  Filled 2022-02-01: qty 17.5

## 2022-02-01 MED ORDER — ASPIRIN 81 MG TABLET,DELAYED RELEASE
81.0000 mg | DELAYED_RELEASE_TABLET | Freq: Every day | ORAL | Status: AC
Start: 2022-02-01 — End: ?

## 2022-02-01 MED ORDER — DOXYCYCLINE HYCLATE 100 MG TABLET
100.0000 mg | ORAL_TABLET | Freq: Two times a day (BID) | ORAL | 0 refills | Status: AC
Start: 2022-02-01 — End: 2022-02-08

## 2022-02-01 NOTE — Nurses Notes (Signed)
Patient's discharge completed at this time.  Patient informed nursing staff that family would not be able to bring oxygen take from home for transport home until tomorrow.  Patient's discharge paperwork placed in chart, IV remains in right forearm, and telemetry monitor remains in place.  Home medications remain locked up with security.

## 2022-02-01 NOTE — Discharge Summary (Signed)
[facility] SUMMARY    PATIENT NAME:  [name], [name]  MRN:  [id number]  DOB:  08/07/1975    ENCOUNTER DATE:  01/30/2022  INPATIENT ADMISSION DATE: 01/30/2022  DISCHARGE DATE:  02/01/2022    ATTENDING PHYSICIAN: Carron Brazen, DO  SERVICE: PRN HOSPITALIST 2  PRIMARY CARE PHYSICIAN: Cam Hai, FNP       No lay caregiver identified.    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 (CMS HCC) [N18.6, Z99.2] 01/31/2022    Elevated troponin [R79.89] 01/30/2022    Ischemic cardiomyopathy [I25.5] 01/30/2022    Acute pulmonary edema (CMS HCC) [J81.0] 01/30/2022    HCAP (healthcare-associated pneumonia) [J18.9] 01/01/2022    ESRD (end stage renal disease) (CMS [place]) [N18.6] 12/16/2021    Ischemic heart disease [I25.9] 12/16/2021    Hypertensive urgency [I16.0] 09/29/2021    Acute on chronic respiratory failure with hypoxia (CMS HCC) [J96.21] 08/28/2021    Fluid overload [E87.70] 08/11/2021    End-stage renal disease on hemodialysis (CMS [place]) [N18.6, Z99.2] 03/02/2013      Resolved Hospital Problems   No resolved problems to display.     Active Non-Hospital Problems    Diagnosis Date Noted    Congestive heart failure, unspecified HF chronicity, unspecified heart failure type (CMS HCC) 01/07/2022    Dyspnea 01/06/2022    Hypertension 01/01/2022    Hyperkalemia 01/01/2022    Bilateral pneumonia 01/01/2022    Hypertensive emergency 12/16/2021    Tobacco use disorder 11/14/2021    Acute exacerbation of COPD with asthma (CMS [place])  10/16/2021    H/O heart artery stent 10/16/2021    Anemia in chronic kidney disease 08/05/2021    Coronary artery disease involving native coronary artery 07/01/2021    Noncompliance 07/01/2021    Tobacco dependence 12/04/2020    Insomnia 12/04/2020           Current Discharge  Medication List        START taking these medications.        Details   aspirin 81 mg Tablet, Delayed Release (E.C.)  Commonly known as: ECOTRIN   81 mg, Oral, DAILY  Refills: 0     doxycycline 100 mg Tablet   100 mg, Oral, 2 TIMES DAILY  Qty: 14 Tablet  Refills: 0     ranolazine 500 mg Tablet Sustained Release 12 hr  Commonly known as: RANEXA   500 mg, Oral, 2 TIMES DAILY  Qty: 60 Tablet  Refills: 0            CONTINUE these medications - NO CHANGES were made during your visit.        Details   amLODIPine 10 mg Tablet  Commonly known as: NORVASC   10 mg, Oral, DAILY, Take one tablet every day  Refills: 0     busPIRone 10 mg Tablet  Commonly known as: BUSPAR   10 mg, Oral, 3 TIMES DAILY  Refills: 0     cloNIDine HCL 0.2 mg Tablet  Commonly known as: CATAPRES   0.2 mg, Oral, 2 TIMES DAILY, Take one tablet twice a day  Refills: 0     doxazosin 4 mg Tablet  Commonly known as: CARDURA   4 mg, Oral, EVERY EVENING  Refills: 0     labetaloL  200 mg Tablet  Commonly known as: NORMODYNE   800 mg, Oral, 3 TIMES DAILY  Refills: 0     nitroGLYCERIN 0.4 mg Tablet, Sublingual  Commonly known as: NITROSTAT   0.4 mg, Sublingual, EVERY 5 MIN PRN, for 3 doses over 15 minutes  Qty: 3 Tablet  Refills: 0     omeprazole 40 mg Capsule, Delayed Release(E.C.)  Commonly known as: PRILOSEC   40 mg, Oral, DAILY  Refills: 0     rosuvastatin 40 mg Tablet  Commonly known as: CRESTOR   40 mg, Oral, EVERY EVENING  Refills: 0     ticagrelor 90 mg Tablet  Commonly known as: BRILINTA   90 mg, Oral, 2 TIMES DAILY  Refills: 0            Discharge med list refreshed?  YES     Allergies   Allergen Reactions    Lisinopril Swelling     Tongue and throat swelling    Cardene [Nicardipine]  Other Adverse Reaction (Add comment)     Chest pain     Oxycodone Itching     HOSPITAL PROCEDURE(S):   No orders of the defined types were placed in this encounter.      REASON FOR HOSPITALIZATION AND HOSPITAL COURSE   BRIEF HPI:  This is a 46 y.o., female admitted for acute  on chronic respiratory failure with hypoxia, fluid overload, end-stage renal disease on hemodialysis, suspected Hcap.  BRIEF HOSPITAL NARRATIVE: See H&P for admission details    A/C respiratory failure w/ hypoxia likely secondary to fluid overload vs HCAP:  Patient required up to 4 L oxygen via nasal cannula upon admission.  She was also given p.r.n. BiPAP therapy.  After dialysis she has been titrated 2 L oxygen via nasal cannula which is her home dose.  She was continued on IV antibiotics to cover for Hcap, blood cultures negative.  Viral respiratory panel negative.    Elevated troponins with history of recent heart catheterization: Reports from [facility] obtained.  Tele cardiology consulted-see consult note.  Patient is started on Imdur.    ESRD on HD:  nephro consulted    CONDITION ON DISCHARGE:  A. Ambulation: Full ambulation  B. Self-care Ability: Complete  C. Cognitive Status Alert and Oriented x 3  D. Code status at discharge:       LINES/DRAINS/WOUNDS AT DISCHARGE:   Patient Lines/Drains/Airways Status       Active Line / Dialysis Catheter / Dialysis Graft / Drain / Airway / Wound       Name Placement date Placement time Site Days    Peripheral IV Right Cephalic  (lateral side of arm) 01/30/22  1700  -- 1    AV Fistula Left;Upper Arm 12/05/20  1502  -- 422    Dialysis Catheter Cuffed/Tunneled 08/13/21  1316  -- 171                    DISCHARGE DISPOSITION:  Home discharge  DISCHARGE INSTRUCTIONS:  Post-Discharge Follow Up Appointments       Follow up with Cam Hai, FNP in 1 week(s)    Phone: [phone]    Where: [address] [place] [address]    Follow up with Beather Arbour, MD in 1 week(s)    Phone: [phone]    Where: [facility]    Follow up with Sherlyn Lees, MD in 2 week(s)    Phone: [phone]    Where: [address]  [place] [address]      Wednesday Mar 26, 2022    Return Patient Visit with Beather Arbour, MD at  1:00 PM      Nephrology, [place], [place] [address]  [phone]          No discharge procedures on file.       Carron Brazen, DO    Copies sent to Care Team         Relationship Specialty Notifications Start End    Deel, Leona Carry, FNP PCP - General NURSE PRACTITIONER  12/04/20     Phone: [phone] Fax: [phone]         [place] [address]    Beather Arbour, [place]  11/15/21     Phone: [phone] Fax: [phone]         [place] [address] [place] [address]    Wilma Flavin, MD  PULMONARY DISEASE  11/15/21     Phone: [phone] Fax: [phone]         [place] [address]    Sherlyn Lees, MD  CARDIOVASCULAR DISEASE  11/15/21     Phone: [phone] Fax: [phone]         [address] [place] [address]            Referring providers can utilize [email or web address] to access their referred [place] patient's information.

## 2022-02-01 NOTE — Nurses Notes (Signed)
Dr. Gwynne Edinger notified to see if pt is ready for discharge. He reports some medications need to be changed and probably need to repeat an echocardiogram this admission. Dr. Arvin Collard notified and will cancel discharge and do an echocardiogram tomorrow 02/02/2022.

## 2022-02-01 NOTE — Consults (Signed)
[facility]   Nephrology Consult      [place], [place], 46 y.o. female  Encounter Start Date:  01/30/2022  Inpatient Admission Date:  01/30/2022  Date of Birth:  04-11-1975    Admitting Provider:  Hospitalist service    Reason for Consult:  End-stage renal disease on hemodialysis with fluid overload    HPI:  [name] is a 46 y.o. 46 American female with history of chest pain, dyspnea and renal failure. She was discharged yesterday from [facility] where she was admitted for NSTEMI. She reports having a cath showing significant CAD.  She has severe underlying coronary disease   So this patient known to have end-stage renal disease on hemodialysis 3 times a week she is been dialyzed almost daily in [facility] as she said still short of breath fluid overloaded will dialyze her today with ultrafiltration 3 L continue to evaluate her for need for dialysis on a daily basis anemia of chronic kidney disease treatment per protocol secondary hyperparathyroidism calcium Rocaltrol  01-31-2022  Nephrology progress note on this patient known to have end-stage renal disease CHF came in with shortness of breadth chest pain fluid overload dialyzed yesterday ultrafiltration 3 L dialyzed today ultrafiltration 2-1/2 L still on oxygen will plan for dialysis tomorrow with ultrafiltration to 3 L as tolerated continue to monitor need for dialysis on a daily basis anemia of chronic kidney disease treatment per protocol secondary hyperparathyroidism calcium Rocaltrol   02-01-2022  Nephrology progress note on this patient with end-stage renal disease hemodialysis 3 times a week came in with shortness of breadth fluid overload dialyzed 3 days in a row feels better today after dialysis continue dialysis as scheduled anemia chronic kidney disease treatment per protocol if she goes home she should follow-up with her nephrologist and continue dialysis as scheduled 3 times a week secondary  hyperparathyroidism on calcium Rocaltrol anemia of chronic kidney disease treatment per protocol    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    Tricuspid valve regurgitation     Uncontrolled hypertension          Medications Prior to Admission       Prescriptions    amLODIPine (NORVASC) 10 mg Oral Tablet    Take 1 Tablet (10 mg total) by mouth Once a day Take one tablet every day    busPIRone (BUSPAR) 10 mg Oral Tablet    Take 1 Tablet (10 mg total) by mouth Three times a day    cloNIDine HCL (CATAPRES) 0.2 mg Oral Tablet    Take 1 Tablet (0.2 mg total) by mouth Twice daily Take one tablet twice a day    doxazosin (CARDURA) 4 mg Oral Tablet    Take 1 Tablet (4 mg total) by mouth Every evening    labetaloL (NORMODYNE) 200 mg Oral Tablet    Take 4 Tablets (800 mg total) by mouth Three times  a day    nitroGLYCERIN (NITROSTAT) 0.4 mg Sublingual Tablet, Sublingual    Place 1 Tablet (0.4 mg total) under the tongue Every 5 minutes as needed for Chest pain for up to 3 doses for 3 doses over 15 minutes    omeprazole (PRILOSEC) 40 mg Oral Capsule, Delayed Release(E.C.)    Take 1 Capsule (40 mg total) by mouth Once a day    rosuvastatin (CRESTOR) 40 mg Oral Tablet    Take 1 Tablet (40 mg total) by mouth Every evening    ticagrelor (BRILINTA) 90 mg Oral Tablet    Take 1 Tablet (90 mg total) by mouth Twice daily          acetaminophen (TYLENOL) tablet, 650 mg, Oral, Q4H PRN  alcohol 62 % (NOZIN NASAL SANITIZER) nasal solution, 1 Each, Each Nostril, 2x/day  amLODIPine (NORVASC) tablet, 10 mg, Oral, Daily  aspirin  chewable tablet 324 mg, 324 mg, Oral, Now  busPIRone (BUSPAR) tablet, 10 mg, Oral, 3x/day  cefepime (MAXIPIME) 500 mg in NS 50 mL IVPB, 500 mg, Intravenous, Q24H  cloNIDine (CATAPRES) tablet, 0.2 mg, Oral, 2x/day  doxazosin (CARDURA) tablet, 4 mg, Oral, QPM  folic acid (FOLVITE) tablet, 1 mg, Oral, Daily  heparin 5,000 unit/mL injection, 5,000 Units, Subcutaneous, Q8HRS  HYDROmorphone (DILAUDID) 2 mg/mL injection, 0.2 mg, Intravenous, Q4H PRN  labetalol (NORMODYNE) tablet, 800 mg, Oral, 3x/day  nitroGLYCERIN (NITROSTAT) sublingual tablet, 0.4 mg, Sublingual, Q5 Min PRN  ondansetron (ZOFRAN) 2 mg/mL injection, 4 mg, Intravenous, Q6H PRN  pantoprazole (PROTONIX) delayed release tablet, 40 mg, Oral, Daily  pravastatin (PRAVACHOL) tablet, 40 mg, Oral, QPM  ranolazine (RANEXA) extended release tablet, 500 mg, Oral, 2x/day  ticagrelor (BRILINTA) tablet, 90 mg, Oral, 2x/day  Vancomycin IV - Pharmacist to Dose per Protocol, , Does not apply, Daily PRN      Allergies   Allergen Reactions    Lisinopril Swelling     Tongue and throat swelling    Cardene [Nicardipine]  Other Adverse Reaction (Add comment)     Chest pain     Oxycodone Itching       ROS:   Constitutional: negative for fevers, chills, sweats, and fatigue  Eyes: negative for visual disturbance, irritation, redness, and icterus  Ears, nose, mouth, throat, and face: negative for hearing loss, tinnitus, ear drainage, nasal congestion, epistaxis, and sore throat  Respiratory: negative for cough, sputum, hemoptysis, wheezing, or dyspnea on exertion  Cardiovascular: negative for chest pain, palpitations, , orthopnea, paroxysmal nocturnal dyspnea, and lower extremity edema  Gastrointestinal: negative for  nausea, vomiting, melena, diarrhea, constipation, and abdominal pain  Genitourinary:negative for frequency, dysuria, nocturia, urinary incontinence, hesitancy, and hematuria  Integument/breast: negative for rash, skin lesion(s), and pruritus  Hematologic/lymphatic:  negative for easy bruising, bleeding, and petechiae  Musculoskeletal:negative for myalgias, arthralgias, neck pain, back pain, and muscle weakness  Neurological: negative for headaches, dizziness, seizures, speech problems, tremor, and weakness  Behavioral/Psych: negative for anxiety, behavior problems, mood swings, and sleep disturbance  Endocrine: negative for temperature intolerance  Allergic/Immunologic: negative for urticaria and angioedema      EXAM:  Filed Vitals:    02/01/22 0752 02/01/22 1000 02/01/22 1047 02/01/22 1112   BP: 122/69      Pulse: 66  66    Resp: 17 16  16    Temp: 37.3 C (99.2 F)      SpO2: 100% 100%         Constitutional: Alert and Oriented time person and place . Not in acute distress  HEENT : Vision and hearing grossly normal   Eyes: Conjunctiva clear., Pupils equal and round, reactive to light and accomodation. , Sclera non-icteric.   ENT: Nose without erythema. , Mouth mucous membranes moist.   Neck: JVD negative no thyromegaly or lymphadenopathy and supple, symmetrical, trachea midline  Respiratory: Clear to auscultation bilaterally. No wheezes, No rales, Good air exchange bilaterally  Cardiovascular: regular rate and rhythm no murmurs no rub  Gastrointestinal: Soft, non-tender, Bowel sounds normal, No hepatosplenomegaly  Genitourinary: no suprapubic tenderness  Lower Extremities : No edema . Pulses + 4 bilateral   Neurologic: Grossly normal. Speech clear.   Psychiatric: Normal mood and affect,   Labs:         CBC Results Differential Results   Recent Labs     01/30/22  1206 01/31/22  1310   WBC 14.3* 10.1   HGB 8.5* 9.1*   HCT 27.4* 28.6*    Recent Labs     01/30/22  1206 01/31/22  1310   PMNS 86* 78*   LYMPHOCYTES 3* 9*   MONOCYTES 9 10   EOSINOPHIL 1 2   BASOPHILS 0  0.00 1  0.10   PMNABS 12.40* 7.90*   LYMPHSABS 0.50* 0.90*   MONOSABS 1.30* 1.00   EOSABS 0.20 0.20      BMP Results Other Chemistries Results   Recent Labs     01/30/22  1205 01/31/22  1310   SODIUM 137 138    POTASSIUM 3.7 3.4*   CHLORIDE 99 101   CO2 23 26   BUN 24 13   CREATININE 6.96* 3.78*   GFR 7* 14*   ANIONGAP 15* 11    Recent Labs     01/30/22  1205 01/31/22  1310   CALCIUM 9.1 9.3   ALBUMIN 4.0  --    MAGNESIUM  --  1.9      Liver/Pancreas Enzyme Results Blood Gas     Recent Labs     01/30/22  1205   TOTALPROTEIN 7.1   ALBUMIN 4.0   AST 15   ALT 9   ALKPHOS 70    No results found for this encounter   Cardiac Results    Coags Results     TROPONIN I  Lab Results   Component Value Date    UHCEASTTROPI 0.06 ([place]) 12/13/2020    TROPONINI 2,728 ([place]) 01/30/2022    TROPONINI 2,783 ([place]) 01/30/2022    TROPONINI 2,472 ([place]) 01/30/2022         No results for input(s): "INR" in the last 72 hours.    Invalid input(s): "PTT", "PT"       Imaging Studies:    XR AP MOBILE CHEST   Final Result   Bilateral fairly diffuse alveolar infiltrates are again noted and appear somewhat worse than on the last examination.         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 (CMS HCC)    Elevated troponin    Ischemic cardiomyopathy    Acute pulmonary edema (CMS HCC)    Chest pain    HCAP (healthcare-associated pneumonia)    ESRD (end stage renal disease) (CMS HCC)    Ischemic heart disease    Hypertensive urgency    Acute  on chronic respiratory failure with hypoxia (CMS HCC)    End-stage renal disease on hemodialysis (CMS HCC)      End-stage renal disease on hemodialysis 3 times a week with fluid overload dialyzed daily for the last 3 days with ultrafiltration to 3 L each time feels better after dialysis today she could go home and follow-up with her nephrologist follow-up with dialysis 3 times a week as scheduled  Fluid overload dialyzed 3 times a in a row with ultrafiltration to 3 L each time feels better  Anemia chronic kidney disease treatment per  protocol  Secondary hyperparathyroidism calcium Rocaltrol      Richardean Sale, MD

## 2022-02-02 ENCOUNTER — Inpatient Hospital Stay (HOSPITAL_COMMUNITY): Payer: Medicare Other

## 2022-02-02 ENCOUNTER — Other Ambulatory Visit: Payer: Self-pay

## 2022-02-02 NOTE — Nurses Notes (Signed)
No dialysis today. Can be discharged home.

## 2022-02-02 NOTE — Nurses Notes (Signed)
Discharge instructions reviewed with patient, patient verbalized understanding. Peripheral IV and telemetry removed. All patient belongings sent with patient including clothing, shoes, jewelry, home medications, cell phone, and purse/bag. Patient taken to vehicle via wheelchair and discharged home with family.

## 2022-02-03 ENCOUNTER — Telehealth (HOSPITAL_COMMUNITY): Payer: Self-pay

## 2022-02-03 DIAGNOSIS — I444 Left anterior fascicular block: Secondary | ICD-10-CM

## 2022-02-03 LAB — ECG 12 LEAD
Atrial Rate: 96 {beats}/min
Calculated P Axis: 75 degrees
Calculated R Axis: -64 degrees
Calculated T Axis: 93 degrees
PR Interval: 204 ms
QRS Duration: 110 ms
QT Interval: 386 ms
QTC Calculation: 487 ms
Ventricular rate: 96 {beats}/min

## 2022-02-03 LAB — RESPIRATORY CULTURE AND GRAM STAIN (PERFORMABLE): RESPIRATORY CULTURE: NORMAL

## 2022-02-05 LAB — ADULT ROUTINE BLOOD CULTURE, SET OF 2 BOTTLES (BACTERIA AND YEAST)
BLOOD CULTURE, ROUTINE: NO GROWTH
BLOOD CULTURE, ROUTINE: NO GROWTH

## 2022-03-05 ENCOUNTER — Emergency Department
Admission: EM | Admit: 2022-03-05 | Discharge: 2022-03-05 | Disposition: A | Discharge status: Home / Self Care | Payer: Medicare Other | Attending: Family | Admitting: Family

## 2022-03-05 ENCOUNTER — Encounter (HOSPITAL_BASED_OUTPATIENT_CLINIC_OR_DEPARTMENT_OTHER): Payer: Self-pay

## 2022-03-05 ENCOUNTER — Other Ambulatory Visit: Payer: Self-pay

## 2022-03-05 ENCOUNTER — Emergency Department (HOSPITAL_BASED_OUTPATIENT_CLINIC_OR_DEPARTMENT_OTHER): Payer: Medicare Other

## 2022-03-05 DIAGNOSIS — K2970 Gastritis, unspecified, without bleeding: Secondary | ICD-10-CM | POA: Insufficient documentation

## 2022-03-05 DIAGNOSIS — N186 End stage renal disease: Secondary | ICD-10-CM

## 2022-03-05 DIAGNOSIS — N281 Cyst of kidney, acquired: Secondary | ICD-10-CM | POA: Insufficient documentation

## 2022-03-05 LAB — POC BLOOD GLUCOSE (RESULTS): GLUCOSE, POC: 93 mg/dl (ref 50–500)

## 2022-03-05 LAB — COMPREHENSIVE METABOLIC PANEL, NON-FASTING
ALBUMIN/GLOBULIN RATIO: 0.7 — ABNORMAL LOW (ref 0.8–1.4)
ALBUMIN: 3.2 g/dL — ABNORMAL LOW (ref 3.4–5.0)
ALKALINE PHOSPHATASE: 94 U/L (ref 46–116)
ALT (SGPT): 10 U/L (ref <=78)
ANION GAP: 15 mmol/L — ABNORMAL HIGH (ref 4–13)
AST (SGOT): 17 U/L (ref 15–37)
BILIRUBIN TOTAL: 0.9 mg/dL (ref 0.2–1.0)
BUN/CREA RATIO: 5
BUN: 51 mg/dL — ABNORMAL HIGH (ref 7–18)
CALCIUM, CORRECTED: 9.5 mg/dL
CALCIUM: 8.9 mg/dL (ref 8.5–10.1)
CHLORIDE: 93 mmol/L — ABNORMAL LOW (ref 98–107)
CO2 TOTAL: 24 mmol/L (ref 21–32)
CREATININE: 10.57 mg/dL — ABNORMAL HIGH (ref 0.55–1.02)
ESTIMATED GFR: 4 mL/min/{1.73_m2} — ABNORMAL LOW (ref >59)
GLOBULIN: 4.5
GLUCOSE: 65 mg/dL — ABNORMAL LOW (ref 74–106)
OSMOLALITY, CALCULATED: 277 mOsm/kg (ref 270–290)
POTASSIUM: 4.9 mmol/L (ref 3.5–5.1)
PROTEIN TOTAL: 7.7 g/dL (ref 6.4–8.2)
SODIUM: 132 mmol/L — ABNORMAL LOW (ref 136–145)

## 2022-03-05 LAB — CBC WITH DIFF
BASOPHIL #: 0.02 10*3/uL (ref 0.00–0.30)
BASOPHIL %: 0 % (ref 0–3)
EOSINOPHIL #: 0.12 10*3/uL (ref 0.00–0.80)
EOSINOPHIL %: 1 % (ref 0–7)
HCT: 30.5 % — ABNORMAL LOW (ref 37.0–47.0)
HGB: 9.8 g/dL — ABNORMAL LOW (ref 12.5–16.0)
LYMPHOCYTE #: 0.62 10*3/uL — ABNORMAL LOW (ref 1.10–5.00)
LYMPHOCYTE %: 8 % — ABNORMAL LOW (ref 25–45)
MCH: 29.5 pg (ref 27.0–32.0)
MCHC: 32 g/dL (ref 32.0–36.0)
MCV: 92.5 fL (ref 78.0–99.0)
MONOCYTE #: 0.74 10*3/uL (ref 0.00–1.30)
MONOCYTE %: 9 % (ref 0–12)
MPV: 8.7 fL (ref 7.4–10.4)
NEUTROPHIL #: 6.64 10*3/uL (ref 1.80–8.40)
NEUTROPHIL %: 82 % — ABNORMAL HIGH (ref 40–76)
PLATELETS: 271 10*3/uL (ref 140–440)
RBC: 3.3 10*6/uL — ABNORMAL LOW (ref 4.20–5.40)
RDW: 21.2 % — ABNORMAL HIGH (ref 11.6–14.8)
WBC: 8.1 10*3/uL (ref 4.0–10.5)

## 2022-03-05 LAB — LIPASE: LIPASE: 26 U/L (ref 11–82)

## 2022-03-05 LAB — AMYLASE: AMYLASE: 46 U/L (ref 25–115)

## 2022-03-05 MED ORDER — PANTOPRAZOLE 40 MG INTRAVENOUS SOLUTION
INTRAVENOUS | Status: AC
Start: 2022-03-05 — End: 2022-03-05
  Filled 2022-03-05: qty 10

## 2022-03-05 MED ORDER — SUCRALFATE 100 MG/ML ORAL SUSPENSION
ORAL | Status: AC
Start: 2022-03-05 — End: 2022-03-05
  Filled 2022-03-05: qty 10

## 2022-03-05 MED ORDER — ONDANSETRON 4 MG DISINTEGRATING TABLET
4.0000 mg | ORAL_TABLET | Freq: Three times a day (TID) | ORAL | 0 refills | Status: AC | PRN
Start: 2022-03-05 — End: ?

## 2022-03-05 MED ORDER — SUCRALFATE 1 GRAM TABLET
1.0000 g | ORAL_TABLET | Freq: Four times a day (QID) | ORAL | 0 refills | Status: AC
Start: 2022-03-05 — End: ?

## 2022-03-05 MED ORDER — FAMOTIDINE 20 MG TABLET
20.0000 mg | ORAL_TABLET | Freq: Two times a day (BID) | ORAL | 0 refills | Status: AC
Start: 2022-03-05 — End: ?

## 2022-03-05 MED ORDER — SUCRALFATE 100 MG/ML ORAL SUSPENSION
1.0000 g | Freq: Four times a day (QID) | ORAL | Status: DC
Start: 2022-03-05 — End: 2022-03-06
  Administered 2022-03-05: 1 g via ORAL

## 2022-03-05 MED ORDER — DICYCLOMINE 10 MG CAPSULE
10.0000 mg | ORAL_CAPSULE | Freq: Three times a day (TID) | ORAL | 0 refills | Status: AC
Start: 2022-03-05 — End: ?

## 2022-03-05 MED ORDER — FAMOTIDINE (PF) 20 MG/2 ML INTRAVENOUS SOLUTION
INTRAVENOUS | Status: AC
Start: 2022-03-05 — End: 2022-03-05
  Filled 2022-03-05: qty 2

## 2022-03-05 MED ORDER — PANTOPRAZOLE 40 MG INTRAVENOUS SOLUTION
40.0000 mg | INTRAVENOUS | Status: AC
Start: 2022-03-05 — End: 2022-03-05
  Administered 2022-03-05: 40 mg via INTRAVENOUS

## 2022-03-05 MED ORDER — ONDANSETRON HCL (PF) 4 MG/2 ML INJECTION SOLUTION
4.0000 mg | INTRAMUSCULAR | Status: AC
Start: 2022-03-05 — End: 2022-03-05
  Administered 2022-03-05: 4 mg via INTRAVENOUS

## 2022-03-05 MED ORDER — SODIUM CHLORIDE 0.9 % (FLUSH) INJECTION SYRINGE
3.0000 mL | INJECTION | INTRAMUSCULAR | Status: DC | PRN
Start: 2022-03-05 — End: 2022-03-06

## 2022-03-05 MED ORDER — SODIUM CHLORIDE 0.9 % (FLUSH) INJECTION SYRINGE
3.0000 mL | INJECTION | Freq: Three times a day (TID) | INTRAMUSCULAR | Status: DC
Start: 2022-03-05 — End: 2022-03-06
  Administered 2022-03-05: 0 mL

## 2022-03-05 MED ORDER — FAMOTIDINE (PF) 20 MG/2 ML INTRAVENOUS SOLUTION
20.0000 mg | INTRAVENOUS | Status: AC
Start: 2022-03-05 — End: 2022-03-05
  Administered 2022-03-05: 20 mg via INTRAVENOUS

## 2022-03-05 MED ORDER — ONDANSETRON HCL (PF) 4 MG/2 ML INJECTION SOLUTION
INTRAMUSCULAR | Status: AC
Start: 2022-03-05 — End: 2022-03-05
  Filled 2022-03-05: qty 2

## 2022-03-05 NOTE — ED Provider Notes (Signed)
[facility] Emergency Department  ED Primary Provider Note  History of Present Illness   Chief Complaint   Patient presents with    Vomiting     Arrival: The patient arrived by Ambulance  [name] is a 47 y.o. female who had concerns including Vomiting.  Pt states she has had nausea and burning in epigastric denies cp nor sob.     Review of Systems   Constitutional: No fever, chills or weakness   Skin: No rash or diaphoresis  HENT: No headaches, or congestion  Eyes: No vision changes or photophobia   Cardio: No chest pain, palpitations or leg swelling   Respiratory: No cough, wheezing or SOB  GI:  + abd pain  nausea, vomiting no  stool changes  GU:  No dysuria, hematuria, or increased frequency  MSK: No muscle aches, joint or back pain  Neuro: No seizures, LOC, numbness, tingling, or focal weakness  Psychiatric: No depression, SI or substance abuse  All other systems reviewed and are negative.    Historical Data   History Reviewed This Encounter: all noted and reviewed    Physical Exam   ED Triage Vitals [03/05/22 1758]   BP (Non-Invasive) 130/80   Heart Rate 74   Respiratory Rate 17   Temperature 36.2 C (97.1 F)   SpO2 100 %   Weight 81.6 kg (180 lb)   Height 1.702 m (5\' 7" )     Constitutional:  47 y.o. female who appears in no distress. Normal color, no cyanosis.   HENT:   Head: Normocephalic and atraumatic.   Mouth/Throat: Oropharynx is clear and moist.   Eyes: EOMI, PERRL   Neck: Trachea midline. Neck supple.  Cardiovascular: RRR, No murmurs, rubs or gallops. Intact distal pulses.  Pulmonary/Chest: BS equal bilaterally. No respiratory distress. No wheezes, rales or chest tenderness.   Abdominal: Bowel sounds present and normal. Abdomen soft, mild mid tenderness, no rebound and no guarding.  Back: No midline spinal tenderness, no paraspinal tenderness, no CVA tenderness.           Musculoskeletal: No edema, tenderness or deformity.  Skin: warm and dry. No rash,  erythema, pallor or cyanosis  Psychiatric: normal mood and affect. Behavior is normal.   Neurological: Patient keenly alert and responsive, easily able to raise eyebrows, facial muscles/expressions symmetric, speaking in fluent sentences, moving all extremities equally and fully, normal gait  Patient Data     Labs Ordered/Reviewed   COMPREHENSIVE METABOLIC PANEL, NON-FASTING - Abnormal; Notable for the following components:       Result Value    SODIUM 132 (*)     CHLORIDE 93 (*)     ANION GAP 15 (*)     BUN 51 (*)     CREATININE 10.57 (*)     ESTIMATED GFR 4 (*)     ALBUMIN 3.2 (*)     GLUCOSE 65 (*)     ALBUMIN/GLOBULIN RATIO 0.7 (*)     All other components within normal limits    Narrative:     Estimated Glomerular Filtration Rate (eGFR) is calculated using the CKD-EPI (2021) equation, intended for patients 4 years of age and older. If gender is not documented or "unknown", there will be no eGFR calculation.   CBC WITH DIFF - Abnormal; Notable for the following components:    RBC 3.30 (*)     HGB 9.8 (*)     HCT 30.5 (*)     RDW 21.2 (*)  NEUTROPHIL % 82 (*)     LYMPHOCYTE % 8 (*)     LYMPHOCYTE # 0.62 (*)     All other components within normal limits   LIPASE - Normal   AMYLASE - Normal   POC BLOOD GLUCOSE (RESULTS) - Normal   CBC/DIFF    Narrative:     The following orders were created for panel order CBC/DIFF.  Procedure                               Abnormality         Status                     ---------                               -----------         ------                     CBC WITH CVUD[[id number]]                Abnormal            Final result                 Please view results for these tests on the individual orders.     CT ABDOMEN PELVIS WO IV CONTRAST   Final Result by Edi, Radresults In (01/10 1907)   1. NO ACUTE FINDINGS AT THE ABDOMEN OR PELVIS ON NONCONTRAST CT.   2. Bilateral renal cysts. Chronic changes both lung bases.          One or more dose reduction techniques were used (e.g.,  Automated exposure control, adjustment of the mA and/or kV according to patient size, use of iterative reconstruction technique).         Radiologist location ID: [facility] Decision Making   Diff dx of gastritis pud stone in cbd. Dehydration.      Tolerated ginger ale. States just sore feeling no nv no burning no cp no sob.   No emesis in er. Sx resolved. Wants to go home.   Medications Administered in the ED   NS flush syringe (0 mL Intracatheter Not Given 03/05/22 2200)   NS flush syringe (has no administration in time range)   sucralfate (CARAFATE) 100mg  per mL oral liquid (1 g Oral Given 03/05/22 2223)   pantoprazole (PROTONIX) injection (40 mg Intravenous Given 03/05/22 1926)   famotidine (PEPCID) 10 mg/mL injection (20 mg Intravenous Given 03/05/22 1926)   ondansetron (ZOFRAN) 2 mg/mL injection (4 mg Intravenous Given 03/05/22 1926)     Clinical Impression   Gastritis,unspecified, unspecified chronicity, unspecified gastritis type - no bleeding (Primary)   ESRF (end stage renal failure) (CMS HCC)       Disposition: Discharged

## 2022-03-05 NOTE — ED Triage Notes (Signed)
Pt states since having dialysis yesterday she has had nausea and vomiting.

## 2022-03-05 NOTE — ED Nurses Note (Signed)
IV DC.  Cath intact.  No redness or edema at site.  VSS.  Verbalized understanding of discharge instructions, rx education, and follow up information.  AVS given to patient.  Ambulatory out of Er with no further complaints.

## 2022-03-08 ENCOUNTER — Encounter (HOSPITAL_BASED_OUTPATIENT_CLINIC_OR_DEPARTMENT_OTHER): Payer: Self-pay

## 2022-03-08 ENCOUNTER — Other Ambulatory Visit: Payer: Self-pay

## 2022-03-08 ENCOUNTER — Emergency Department
Admission: EM | Admit: 2022-03-08 | Discharge: 2022-03-08 | Disposition: A | Discharge status: Home / Self Care | Payer: Medicare Other | Attending: Emergency Medicine | Admitting: Emergency Medicine

## 2022-03-08 DIAGNOSIS — R1013 Epigastric pain: Secondary | ICD-10-CM

## 2022-03-08 DIAGNOSIS — F17210 Nicotine dependence, cigarettes, uncomplicated: Secondary | ICD-10-CM | POA: Insufficient documentation

## 2022-03-08 DIAGNOSIS — K219 Gastro-esophageal reflux disease without esophagitis: Secondary | ICD-10-CM | POA: Insufficient documentation

## 2022-03-08 MED ORDER — ONDANSETRON HCL (PF) 4 MG/2 ML INJECTION SOLUTION
INTRAMUSCULAR | Status: AC
Start: 2022-03-08 — End: 2022-03-08
  Filled 2022-03-08: qty 4

## 2022-03-08 MED ORDER — METOCLOPRAMIDE 5 MG/ML INJECTION SOLUTION
INTRAMUSCULAR | Status: AC
Start: 2022-03-08 — End: 2022-03-08
  Filled 2022-03-08: qty 2

## 2022-03-08 MED ORDER — METOCLOPRAMIDE 5 MG/ML INJECTION SOLUTION
10.0000 mg | INTRAMUSCULAR | Status: AC
Start: 2022-03-08 — End: 2022-03-08
  Administered 2022-03-08: 10 mg via INTRAVENOUS

## 2022-03-08 MED ORDER — PANTOPRAZOLE 40 MG INTRAVENOUS SOLUTION
INTRAVENOUS | Status: AC
Start: 2022-03-08 — End: 2022-03-08
  Filled 2022-03-08: qty 10

## 2022-03-08 MED ORDER — PANTOPRAZOLE 40 MG INTRAVENOUS SOLUTION
40.0000 mg | INTRAVENOUS | Status: AC
Start: 2022-03-08 — End: 2022-03-08
  Administered 2022-03-08: 40 mg via INTRAVENOUS

## 2022-03-08 MED ORDER — ONDANSETRON HCL (PF) 4 MG/2 ML INJECTION SOLUTION
8.0000 mg | INTRAMUSCULAR | Status: AC
Start: 2022-03-08 — End: 2022-03-08
  Administered 2022-03-08: 8 mg via INTRAVENOUS

## 2022-03-08 NOTE — ED Nurses Note (Signed)
Patient discharged home all instructions gone over with patient with opportunity to ask questions. Patient verbalized understanding and ambulated off unit independently.

## 2022-03-08 NOTE — ED Attending Note (Addendum)
[facility] emergency department         HISTORY OF PRESENT ILLNESS     Date:  03/08/2022  Patient's Name:  [name]  Date of Birth:  March 26, 1975    Patient is a 47 year old brought to the emergency room by EMS.  She is on end-stage renal dialysis Tuesday Thursday Saturday for patient states she is due for dialysis today.  Patient states she is having epigastric burning pain off and on for the past few weeks she was placed on Protonix but tonight has had minimal relief.  Patient denies constipation.  Patient states she took her medication at home without relief prompting her to call the  EMS        Review of Systems     Review of Systems   Constitutional: Negative.    HENT: Negative.     Gastrointestinal:  Positive for abdominal distention, nausea and vomiting.   Genitourinary: Negative.    All other systems reviewed and are negative.      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    Tricuspid valve regurgitation     Uncontrolled hypertension        Past Surgical History:  Past Surgical History:   Procedure Laterality Date    Coronary artery angioplasty      Esophagogastroduodenoscopy      Hx back surgery      Hx cholecystectomy      Hx foot surgery Right     Hx hysterectomy      Hx tonsillectomy         Social History:  Social History     Tobacco Use    Smoking status: Every Day     Packs/day: 1.00     Years: 10.00     Additional pack years: 0.00     Total pack years: 10.00     Types: Cigarettes     Passive  exposure: Past    Smokeless tobacco: Former    Tobacco comments:     "Haven't had a cigerette in 2 weeks."   Vaping Use    Vaping Use: Never used   Substance Use Topics    Alcohol use: Not Currently    Drug use: Yes     Types: Marijuana     Social History     Substance and Sexual Activity   Drug Use Yes    Types: Marijuana       Family History:  Family History   Problem Relation Age of Onset    Diabetes type II Mother     Coronary Artery Disease Mother     Breast Cancer Mother     Hypertension (High Blood Pressure) Father        Medication History:  Current Outpatient Medications   Medication Sig    amLODIPine (NORVASC) 10  mg Oral Tablet Take 1 Tablet (10 mg total) by mouth Once a day Take one tablet every day    aspirin (ECOTRIN) 81 mg Oral Tablet, Delayed Release (E.C.) Take 1 Tablet (81 mg total) by mouth Once a day    busPIRone (BUSPAR) 10 mg Oral Tablet Take 1 Tablet (10 mg total) by mouth Three times a day    cloNIDine HCL (CATAPRES) 0.2 mg Oral Tablet Take 1 Tablet (0.2 mg total) by mouth Twice daily Take one tablet twice a day    dicyclomine (BENTYL) 10 mg Oral Capsule Take 1 Capsule (10 mg total) by mouth Three times a day    doxazosin (CARDURA) 4 mg Oral Tablet Take 1 Tablet (4 mg total) by mouth Every evening    doxycycline monohydrate (MONODOX) 100 mg Oral Capsule Take 1 Capsule (100 mg total) by mouth Twice daily    famotidine (PEPCID) 20 mg Oral Tablet Take 1 Tablet (20 mg total) by mouth Twice daily    labetaloL (NORMODYNE) 200 mg Oral Tablet Take 4 Tablets (800 mg total) by mouth Three times a day    nitroGLYCERIN (NITROSTAT) 0.4 mg Sublingual Tablet, Sublingual Place 1 Tablet (0.4 mg total) under the tongue Every 5 minutes as needed for Chest pain for up to 3 doses for 3 doses over 15 minutes    omeprazole (PRILOSEC) 40 mg Oral Capsule, Delayed Release(E.C.) Take 1 Capsule (40 mg total) by mouth Once a day    ondansetron (ZOFRAN ODT) 4 mg Oral Tablet, Rapid Dissolve Take 1 Tablet (4 mg total) by  mouth Every 8 hours as needed for Nausea/Vomiting    rosuvastatin (CRESTOR) 40 mg Oral Tablet Take 1 Tablet (40 mg total) by mouth Every evening    sucralfate (CARAFATE) 1 gram Oral Tablet Take 1 Tablet (1 g total) by mouth Every 6 hours    ticagrelor (BRILINTA) 90 mg Oral Tablet Take 1 Tablet (90 mg total) by mouth Twice daily       Allergies:  Allergies   Allergen Reactions    Lisinopril Swelling     Tongue and throat swelling    Cardene [Nicardipine]  Other Adverse Reaction (Add comment)     Chest pain     Oxycodone Itching       Physical Exam     Vitals:    BP (!) 153/85   Pulse 74   Temp 36.8 C (98.2 F)   Resp 19   Ht 1.753 m (5\' 9" )   Wt 81.6 kg (180 lb)   SpO2 96%   BMI 26.58 kg/m           Physical Exam  Vitals and nursing note reviewed.   Constitutional:       General: She is not in acute distress.     Appearance: She is well-developed.   HENT:      Head: Normocephalic and atraumatic.      Nose: Nose normal.   Eyes:      Conjunctiva/sclera: Conjunctivae normal.   Cardiovascular:      Rate and Rhythm: Normal rate and regular rhythm.      Heart sounds: No murmur heard.  Pulmonary:      Effort: Pulmonary effort is normal. No respiratory distress.      Breath sounds: Normal breath sounds.   Abdominal:      General: Bowel sounds are normal.      Palpations: Abdomen is soft.      Tenderness: There is no abdominal tenderness.  Musculoskeletal:         General: No swelling.      Cervical back: Neck supple.   Skin:     General: Skin is warm and dry.      Capillary Refill: Capillary refill takes less than 2 seconds.   Neurological:      Mental Status: She is alert.   Psychiatric:         Mood and Affect: Mood normal.         Diagnostic Studies/Treatment     Medications:  Medications Administered in the ED   metoclopramide (REGLAN) 5 mg/mL injection (10 mg Intravenous Given 03/08/22 0411)   ondansetron (ZOFRAN) 2 mg/mL injection (8 mg Intravenous Given 03/08/22 0411)   pantoprazole (PROTONIX) injection (40 mg  Intravenous Given 03/08/22 0411)       New Prescriptions    No medications on file       Labs:    No results found for this or any previous visit (from the past 12 hour(s)).     Radiology:  None    No orders to display       ECG:  NONE            Differential diagnosis    Gastroesophageal reflux, gastritis, peptic ulcer disease, end-stage renal disease on hemodialysis  Course/Disposition/Plan     Course:     ED Course as of 03/08/22 0452   Sat Mar 08, 2022   0451 Patient is much improved after treatment in the emergency room no further discomfort or pain.  Patient requesting to be discharged home   Patient will be treated with antiemetics and Protonix to relieve patient's pain and discomfort.    Disposition:    Discharged    Condition at Disposition:       Follow up:   Cam Hai, [address]  [place] [id number]  [phone]    Schedule an appointment as soon as possible for a visit   If symptoms worsen      Clinical Impression:     Clinical Impression   Gastroesophageal reflux disease, unspecified whether esophagitis present (Primary)   Epigastric pain         Winfred Burn, MD

## 2022-03-08 NOTE — ED Nurses Note (Signed)
Pt is comfortable with discharge at this time. Pt does not currently have a way home and requires oxygen at all times. Per charge nurse pt can stay in room until a ride home is able to be secured.

## 2022-03-08 NOTE — ED Nurses Note (Signed)
Pt resting in bed. Pt still unable to secure a ride home. Bus does no run on Saturday. Pt called medicaid taxi and was told there is no transportation available for today. Pt attempting to contact family for a ride.

## 2022-03-08 NOTE — ED Nurses Note (Signed)
Patient sitting up in bed no complaints at this time. Awaiting family to pick her up and take her to her dialysis. Provided her with ginger ale no other needs at this time.

## 2022-03-08 NOTE — ED Triage Notes (Signed)
Per EMS pt c/o epigastric pain with nausea x 1 day. Pt has not vomitted at this time. Pain 8/10

## 2022-03-08 NOTE — ED Nurses Note (Signed)
Pt verbalized that her abd pain has resolved and that her nausea has also resolved.

## 2022-03-20 ENCOUNTER — Emergency Department
Admission: EM | Admit: 2022-03-20 | Discharge: 2022-03-20 | Disposition: A | Discharge status: Home / Self Care | Payer: Medicare Other | Attending: Physician Assistant | Admitting: Physician Assistant

## 2022-03-20 ENCOUNTER — Emergency Department (HOSPITAL_COMMUNITY): Payer: Medicare Other

## 2022-03-20 ENCOUNTER — Encounter (HOSPITAL_COMMUNITY): Payer: Self-pay

## 2022-03-20 ENCOUNTER — Other Ambulatory Visit: Payer: Self-pay

## 2022-03-20 DIAGNOSIS — I5032 Chronic diastolic (congestive) heart failure: Secondary | ICD-10-CM | POA: Insufficient documentation

## 2022-03-20 DIAGNOSIS — Z1152 Encounter for screening for COVID-19: Secondary | ICD-10-CM | POA: Insufficient documentation

## 2022-03-20 DIAGNOSIS — I132 Hypertensive heart and chronic kidney disease with heart failure and with stage 5 chronic kidney disease, or end stage renal disease: Secondary | ICD-10-CM | POA: Insufficient documentation

## 2022-03-20 DIAGNOSIS — I120 Hypertensive chronic kidney disease with stage 5 chronic kidney disease or end stage renal disease: Secondary | ICD-10-CM

## 2022-03-20 DIAGNOSIS — I459 Conduction disorder, unspecified: Secondary | ICD-10-CM

## 2022-03-20 DIAGNOSIS — N186 End stage renal disease: Secondary | ICD-10-CM | POA: Insufficient documentation

## 2022-03-20 DIAGNOSIS — F17210 Nicotine dependence, cigarettes, uncomplicated: Secondary | ICD-10-CM | POA: Insufficient documentation

## 2022-03-20 DIAGNOSIS — Z992 Dependence on renal dialysis: Secondary | ICD-10-CM | POA: Insufficient documentation

## 2022-03-20 DIAGNOSIS — I440 Atrioventricular block, first degree: Secondary | ICD-10-CM | POA: Insufficient documentation

## 2022-03-20 DIAGNOSIS — Z91158 Patient's noncompliance with renal dialysis for other reason: Secondary | ICD-10-CM | POA: Insufficient documentation

## 2022-03-20 DIAGNOSIS — J45901 Unspecified asthma with (acute) exacerbation: Secondary | ICD-10-CM | POA: Insufficient documentation

## 2022-03-20 LAB — CBC WITH DIFF
BASOPHIL #: 0.2 10*3/uL — ABNORMAL HIGH (ref 0.00–0.10)
BASOPHIL %: 2 % — ABNORMAL HIGH (ref 0–1)
EOSINOPHIL #: 0.4 10*3/uL (ref 0.00–0.50)
EOSINOPHIL %: 4 %
HCT: 27.8 % — ABNORMAL LOW (ref 31.2–41.9)
HGB: 9.1 g/dL — ABNORMAL LOW (ref 10.9–14.3)
LYMPHOCYTE #: 0.7 10*3/uL — ABNORMAL LOW (ref 1.00–3.00)
LYMPHOCYTE %: 8 % — ABNORMAL LOW (ref 16–44)
MCH: 28.8 pg (ref 24.7–32.8)
MCHC: 32.7 g/dL (ref 32.3–35.6)
MCV: 88.3 fL (ref 75.5–95.3)
MONOCYTE #: 1 10*3/uL (ref 0.30–1.00)
MONOCYTE %: 11 % (ref 5–13)
MPV: 6.4 fL — ABNORMAL LOW (ref 7.9–10.8)
NEUTROPHIL #: 7.2 10*3/uL (ref 1.85–7.80)
NEUTROPHIL %: 76 % (ref 43–77)
PLATELET COMMENT: NORMAL
PLATELETS: 329 10*3/uL (ref 140–440)
RBC: 3.14 10*6/uL — ABNORMAL LOW (ref 3.63–4.92)
RDW: 23.3 % — ABNORMAL HIGH (ref 12.3–17.7)
WBC: 9.5 10*3/uL (ref 3.8–11.8)

## 2022-03-20 LAB — ECG 12 LEAD
Atrial Rate: 72 {beats}/min
Calculated P Axis: 61 degrees
Calculated R Axis: 95 degrees
Calculated T Axis: 65 degrees
PR Interval: 264 ms
QRS Duration: 124 ms
QT Interval: 494 ms
QTC Calculation: 540 ms
Ventricular rate: 72 {beats}/min

## 2022-03-20 LAB — LIPASE: LIPASE: 37 U/L (ref 11–82)

## 2022-03-20 LAB — COMPREHENSIVE METABOLIC PANEL, NON-FASTING
ALBUMIN/GLOBULIN RATIO: 1.3 (ref 0.8–1.4)
ALBUMIN: 4.3 g/dL (ref 3.5–5.7)
ALKALINE PHOSPHATASE: 80 U/L (ref 34–104)
ALT (SGPT): 7 U/L (ref 7–52)
ANION GAP: 19 mmol/L — ABNORMAL HIGH (ref 4–13)
AST (SGOT): 14 U/L (ref 13–39)
BILIRUBIN TOTAL: 0.6 mg/dL (ref 0.3–1.2)
BUN/CREA RATIO: 4 — ABNORMAL LOW (ref 6–22)
BUN: 53 mg/dL — ABNORMAL HIGH (ref 7–25)
CALCIUM, CORRECTED: 9.2 mg/dL (ref 8.9–10.8)
CALCIUM: 9.4 mg/dL (ref 8.6–10.3)
CHLORIDE: 93 mmol/L — ABNORMAL LOW (ref 98–107)
CO2 TOTAL: 22 mmol/L (ref 21–31)
CREATININE: 13.13 mg/dL — ABNORMAL HIGH (ref 0.60–1.30)
ESTIMATED GFR: 3 mL/min/{1.73_m2} — ABNORMAL LOW (ref >59)
GLOBULIN: 3.4 (ref 2.9–5.4)
GLUCOSE: 104 mg/dL (ref 74–109)
OSMOLALITY, CALCULATED: 283 mOsm/kg (ref 270–290)
POTASSIUM: 4.3 mmol/L (ref 3.5–5.1)
PROTEIN TOTAL: 7.7 g/dL (ref 6.4–8.9)
SODIUM: 134 mmol/L — ABNORMAL LOW (ref 136–145)

## 2022-03-20 LAB — COVID-19, FLU A/B, RSV RAPID BY PCR
INFLUENZA VIRUS TYPE A: NOT DETECTED
INFLUENZA VIRUS TYPE B: NOT DETECTED
RESPIRATORY SYNCTIAL VIRUS (RSV): NOT DETECTED
SARS-CoV-2: NOT DETECTED

## 2022-03-20 LAB — TROPONIN-I
TROPONIN I: 59 ng/L — ABNORMAL HIGH (ref <15)
TROPONIN I: 55 ng/L — ABNORMAL HIGH (ref <15)

## 2022-03-20 MED ORDER — DEXAMETHASONE SODIUM PHOSPHATE (PF) 10 MG/ML INJECTION SOLUTION
INTRAMUSCULAR | Status: AC
Start: 2022-03-20 — End: 2022-03-20
  Filled 2022-03-20: qty 1

## 2022-03-20 MED ORDER — METHYLPREDNISOLONE SOD SUCC 125 MG SOLUTION FOR INJECTION WRAPPER
125.0000 mg | INTRAVENOUS | Status: DC
Start: 2022-03-20 — End: 2022-03-20

## 2022-03-20 MED ORDER — PREDNISONE 20 MG TABLET
20.0000 mg | ORAL_TABLET | Freq: Two times a day (BID) | ORAL | 0 refills | Status: AC
Start: 2022-03-20 — End: 2022-03-25

## 2022-03-20 MED ORDER — DEXAMETHASONE SODIUM PHOSPHATE (PF) 10 MG/ML INJECTION SOLUTION
10.0000 mg | INTRAMUSCULAR | Status: AC
Start: 2022-03-20 — End: 2022-03-20
  Administered 2022-03-20: 10 mg via INTRAMUSCULAR

## 2022-03-20 MED ORDER — METHYLPREDNISOLONE SOD SUCC 125 MG SOLUTION FOR INJECTION WRAPPER
INTRAVENOUS | Status: AC
Start: 2022-03-20 — End: 2022-03-20
  Filled 2022-03-20: qty 2

## 2022-03-20 MED ORDER — IPRATROPIUM 0.5 MG-ALBUTEROL 3 MG (2.5 MG BASE)/3 ML NEBULIZATION SOLN
3.0000 mL | INHALATION_SOLUTION | RESPIRATORY_TRACT | Status: AC
Start: 2022-03-20 — End: 2022-03-20
  Administered 2022-03-20: 3 mL via RESPIRATORY_TRACT

## 2022-03-20 NOTE — ED Provider Notes (Signed)
Emergency Medicine      Name: [name]  Age and Gender: 47 y.o. female  Date of Birth: 1975/03/19  MRN: [id number]  PCP: Cam Hai, FNP    CC:  Chief Complaint   Patient presents with    Chest Pain        HPI:  [name] is a 47 y.o. 29 American female who presents to the ER with left chest heaviness and shortness of breath.  Patient states the symptoms began around 11:00 p.m. tonight while she was watching TV at home.  She says she has had a cough and wheezing for a few days since it rained.  She also reports some upper abdominal discomfort.  She does report that she takes dialysis M,W,Th,Fr but missed dialysis on Wednesday because she could not get out of her building.     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    Tricuspid valve regurgitation     Uncontrolled hypertension            Allergies   Allergen Reactions    Lisinopril Swelling     Tongue and throat swelling    Cardene [Nicardipine]  Other Adverse Reaction (Add comment)     Chest pain     Oxycodone Itching       Past Surgical History:   Procedure Laterality Date    CORONARY ARTERY ANGIOPLASTY      ESOPHAGOGASTRODUODENOSCOPY      HX BACK SURGERY      HX CHOLECYSTECTOMY      HX FOOT SURGERY Right     HX HYSTERECTOMY      HX TONSILLECTOMY            Social History     Socioeconomic History    Marital status: Divorced   Tobacco Use    Smoking status: Every Day     Packs/day: 1.00     Years: 10.00     Additional pack years: 0.00     Total pack years: 10.00     Types:  Cigarettes     Passive exposure: Past    Smokeless tobacco: Former    Tobacco comments:     "Haven't had a cigerette in 2 weeks."   Vaping Use    Vaping Use: Never used   Substance and Sexual Activity    Alcohol use: Not Currently    Drug use: Yes     Types: Marijuana    Sexual activity: Not Currently     Social Determinants of Health     Social Connections: Medium Risk (01/30/2022)    Social Connections     SDOH Social Isolation: 1 or 2 times a week       ROS:  No other overt positive review of systems are noted other than stated in the HPI.      Objective:  ED Triage Vitals [03/20/22 0233]   BP (Non-Invasive) (!) 141/90   Heart Rate 74   Respiratory Rate (!) 22   Temperature 36 C (96.8 F)   SpO2 100 %   Weight 81.6 kg (180 lb)   Height 1.753 m (5\' 9" )     Filed Vitals:    03/20/22 0330 03/20/22 0400 03/20/22 0459 03/20/22 0500   BP: (!) 130/108 (!) 117/90  111/69   Pulse: 72 72  72   Resp: 19 18 14 17    Temp:       SpO2:   98%        Nursing notes and vital signs reviewed.    Constitutional - No acute distress.  Alert and Active.  HEENT - Normocephalic. Conjunctiva clear. Moist mucous membranes.   Neck - Trachea midline. No stridor. No hoarseness.  Cardiac - Regular rate and rhythm. No murmurs, rubs, or gallops.   Respiratory/Chest - Normal respiratory effort.  Wheezes in all lung fields.  No crackles. No chest tenderness.  Abdomen - Normal bowel sounds.  Mild epigastric tenderness, soft, non-distended. No rebound or guarding.   Musculoskeletal - Good AROM.  No clubbing, cyanosis or edema.  Skin - Warm and dry, without any rashes or other lesions.  Neuro - Alert and oriented x 3.  Moving all extremities symmetrically.   Psych - Normal mood and affect. Behavior is normal       Any pertinent labs and imaging obtained during this encounter reviewed below in MDM.    MDM/ED Course:        Medical Decision Making  Patient presents to the ER with complaints of left chest heaviness and shortness of breath.  Patient  states the symptoms began around 11:00 p.m. tonight while she was watching TV at home.  She says she has had a cough and wheezing for a few days since it rained.  She also reports some upper abdominal discomfort.  She does report that she takes dialysis M,W,Th,Fr but missed dialysis on Wednesday because she could not get out of her building. On exam, patient has wheezes in all lung fields and mild epigastric tenderness without guarding. Differential diagnoses include fluid overload, asthma exacerbation, pneumonia, ACS, abdominal wall pain, gastritis, pancreatitis. She underwent diagnostics with results as noted in ED course. She was given decadron and duoneb treatment, with resolution of her pain and dyspnea. Labs and EKG appear stable and/or improved compared to her baseline. CXR shows pulmonary edema, improved from most recent CXR a month ago. I feel patient's symptoms are most likely related to asthma exacerbation and mild fluid overload due to missed dialysis yesterday. She is scheduled to go to dialysis today and I feel she is stable for discharge to have outpatient dialysis as her vitals are stable on her regular home O2. She will be discharged home with a prescription for prednisone. Patient does have home neb treatments and has been instructed to take these every 4-6 hours as prescribed and to return to the ER as needed.      Amount and/or Complexity of Data Reviewed  Labs:  Decision-making details documented in ED Course.  Radiology: ordered. Decision-making details documented in ED Course.  ECG/medicine tests:  Decision-making details documented in ED Course.    Risk  Prescription drug management.          ED Course as of 03/20/22 0621   Thu Mar 20, 2022   6160 Patient reassessed, states she feels better   0557 TROPONIN-I(!): 55  Initial trop 59, repeat 55. This is improved from most recent values on file   0557 CBC/DIFF(!)  Stable for patient   [place], NON-FASTING(!)  Stable  for patient   0558 LIPASE: 37  normal   0607 ECG 12 LEAD  Normal sinus rhythm with a rate of 72 beats per minute, first-degree AV block with PR interval of 264, EKG automated interpretation reports righward axis, although this appears inaccurate on my review. There is prolonger QRS and QT intervals, which are chronic for the patient when compared to previous EKGs, minimal voltage criteria for LVH, no STEMI   0607 XR CHEST PA AND LATERAL  Bilateral pulmonary edema, decreased from most recent CXR last month       Orders Placed This Encounter    XR CHEST PA AND LATERAL    CBC/DIFF    COMPREHENSIVE METABOLIC PANEL, NON-FASTING    TROPONIN-I NOW    TROPONIN-I IN ONE HOUR    TROPONIN-I IN THREE HOOURS    CBC WITH DIFF    LIPASE    COVID-19, FLU A/B, RSV RAPID BY PCR    OXYGEN - NASAL CANNULA    ECG 12 LEAD    INSERT & MAINTAIN PERIPHERAL IV ACCESS    ipratropium-albuterol 0.5 mg-3 mg(2.5 mg base)/3 mL Solution for Nebulization    dexAMETHasone (PF) 10 mg/mL injection    predniSONE (DELTASONE) 20 mg Oral Tablet         Impression:   Clinical Impression   Exacerbation of asthma, unspecified asthma severity, unspecified whether persistent (Primary)   ESRD on dialysis (CMS [facility])       Disposition: Discharged    / Elige Ko PA-C  03/20/2022, 05:25  [facility]  Department of Emergency [place]    Portions of this note may have been dictated using voice recognition software.     -----------------------  Results for orders placed or performed during the hospital encounter of 03/20/22 (from the past 12 hour(s))   COMPREHENSIVE METABOLIC PANEL, NON-FASTING   Result Value Ref Range    SODIUM 134 (L) 136 - 145 mmol/L    POTASSIUM 4.3 3.5 - 5.1 mmol/L    CHLORIDE 93 (L) 98 - 107 mmol/L    CO2 TOTAL 22 21 - 31 mmol/L    ANION GAP 19 (H) 4 - 13 mmol/L    BUN 53 (H) 7 - 25 mg/dL    CREATININE 13.13 (H) 0.60 - 1.30 mg/dL    BUN/CREA RATIO 4 (L) 6 - 22    ESTIMATED GFR 3 (L) >59 mL/min/1.93m^2     ALBUMIN 4.3 3.5 - 5.7 g/dL    CALCIUM 9.4 8.6 - 10.3 mg/dL    GLUCOSE 104 74 - 109 mg/dL    ALKALINE PHOSPHATASE 80 34 - 104 U/L    ALT (SGPT) 7 7 - 52 U/L    AST (SGOT) 14 13 - 39 U/L    BILIRUBIN TOTAL 0.6 0.3 - 1.2 mg/dL    PROTEIN TOTAL 7.7 6.4 - 8.9 g/dL    ALBUMIN/GLOBULIN RATIO 1.3 0.8 - 1.4    OSMOLALITY, CALCULATED 283 270 - 290 mOsm/kg    CALCIUM, CORRECTED 9.2 8.9 - 10.8 mg/dL    GLOBULIN 3.4 2.9 - 5.4   TROPONIN-I NOW   Result Value Ref Range    TROPONIN I 59 (H) <15 ng/L   CBC WITH DIFF   Result Value Ref Range    WBC 9.5 3.8 - 11.8 x10^3/uL  RBC 3.14 (L) 3.63 - 4.92 x10^6/uL    HGB 9.1 (L) 10.9 - 14.3 g/dL    HCT 27.8 (L) 31.2 - 41.9 %    MCV 88.3 75.5 - 95.3 fL    MCH 28.8 24.7 - 32.8 pg    MCHC 32.7 32.3 - 35.6 g/dL    RDW 23.3 (H) 12.3 - 17.7 %    PLATELETS 329 140 - 440 x10^3/uL    MPV 6.4 (L) 7.9 - 10.8 fL    NEUTROPHIL % 76 43 - 77 %    LYMPHOCYTE % 8 (L) 16 - 44 %    MONOCYTE % 11 5 - 13 %    EOSINOPHIL % 4 %    BASOPHIL % 2 (H) 0 - 1 %    NEUTROPHIL # 7.20 1.85 - 7.80 x10^3/uL    LYMPHOCYTE # 0.70 (L) 1.00 - 3.00 x10^3/uL    MONOCYTE # 1.00 0.30 - 1.00 x10^3/uL    EOSINOPHIL # 0.40 0.00 - 0.50 x10^3/uL    BASOPHIL # 0.20 (H) 0.00 - 0.10 x10^3/uL    RBC COMMENT 1+aniso,1+poiko     PLATELET COMMENT normal    LIPASE   Result Value Ref Range    LIPASE 37 11 - 82 U/L   TROPONIN-I IN ONE HOUR   Result Value Ref Range    TROPONIN I 55 (H) <15 ng/L   COVID-19, FLU A/B, RSV RAPID BY PCR   Result Value Ref Range    SARS-CoV-2 Not Detected Not Detected    INFLUENZA VIRUS TYPE A Not Detected Not Detected    INFLUENZA VIRUS TYPE B Not Detected Not Detected    RESPIRATORY SYNCTIAL VIRUS (RSV) Not Detected Not Detected     XR CHEST PA AND LATERAL    (Results Pending)

## 2022-03-20 NOTE — ED Nurses Note (Signed)
Pt d/c home at this time. Pt verbalized understanding of d/c instructions. This RN wheeled pt out of department.

## 2022-03-20 NOTE — ED Triage Notes (Signed)
Brought to ED via [facility] EMS with c/o chest pain. Pt is a dialysis pt, 4x per week. Per EMS, pt missed dialysis yesterday and only did half a treatment on Monday.     PTA via EMS: 02 @ 3L/min via [place]

## 2022-03-20 NOTE — Discharge Instructions (Signed)
Go to dialysis today as scheduled. Take prednisone until gone. Use home breathing treatments every 4-6 hours as needed. Return to the ER for other emergencies or as needed

## 2022-03-24 ENCOUNTER — Encounter (INDEPENDENT_AMBULATORY_CARE_PROVIDER_SITE_OTHER): Payer: Self-pay | Admitting: Nephrology

## 2022-03-24 ENCOUNTER — Encounter (HOSPITAL_BASED_OUTPATIENT_CLINIC_OR_DEPARTMENT_OTHER): Payer: Self-pay

## 2022-03-24 ENCOUNTER — Emergency Department (HOSPITAL_BASED_OUTPATIENT_CLINIC_OR_DEPARTMENT_OTHER): Payer: Medicare Other

## 2022-03-24 ENCOUNTER — Other Ambulatory Visit: Payer: Self-pay

## 2022-03-24 ENCOUNTER — Emergency Department
Admission: EM | Admit: 2022-03-24 | Discharge: 2022-03-24 | Disposition: A | Discharge status: Home / Self Care | Payer: Medicare Other | Attending: Emergency Medicine | Admitting: Emergency Medicine

## 2022-03-24 DIAGNOSIS — R9431 Abnormal electrocardiogram [ECG] [EKG]: Secondary | ICD-10-CM

## 2022-03-24 DIAGNOSIS — I132 Hypertensive heart and chronic kidney disease with heart failure and with stage 5 chronic kidney disease, or end stage renal disease: Secondary | ICD-10-CM | POA: Insufficient documentation

## 2022-03-24 DIAGNOSIS — N186 End stage renal disease: Secondary | ICD-10-CM | POA: Insufficient documentation

## 2022-03-24 DIAGNOSIS — I440 Atrioventricular block, first degree: Secondary | ICD-10-CM

## 2022-03-24 DIAGNOSIS — I10 Essential (primary) hypertension: Secondary | ICD-10-CM

## 2022-03-24 DIAGNOSIS — F17210 Nicotine dependence, cigarettes, uncomplicated: Secondary | ICD-10-CM | POA: Insufficient documentation

## 2022-03-24 DIAGNOSIS — R0602 Shortness of breath: Secondary | ICD-10-CM | POA: Insufficient documentation

## 2022-03-24 DIAGNOSIS — I5032 Chronic diastolic (congestive) heart failure: Secondary | ICD-10-CM | POA: Insufficient documentation

## 2022-03-24 DIAGNOSIS — Z992 Dependence on renal dialysis: Secondary | ICD-10-CM | POA: Insufficient documentation

## 2022-03-24 DIAGNOSIS — R079 Chest pain, unspecified: Secondary | ICD-10-CM | POA: Insufficient documentation

## 2022-03-24 LAB — CBC WITH DIFF
BASOPHIL #: 0.03 10*3/uL (ref 0.00–0.30)
BASOPHIL %: 0 % (ref 0–3)
EOSINOPHIL #: 0.17 10*3/uL (ref 0.00–0.80)
EOSINOPHIL %: 1 % (ref 0–7)
HCT: 31.2 % — ABNORMAL LOW (ref 37.0–47.0)
HGB: 9.9 g/dL — ABNORMAL LOW (ref 12.5–16.0)
LYMPHOCYTE #: 0.58 10*3/uL — ABNORMAL LOW (ref 1.10–5.00)
LYMPHOCYTE %: 5 % — ABNORMAL LOW (ref 25–45)
MCH: 29.7 pg (ref 27.0–32.0)
MCHC: 31.7 g/dL — ABNORMAL LOW (ref 32.0–36.0)
MCV: 93.7 fL (ref 78.0–99.0)
MONOCYTE #: 0.95 10*3/uL (ref 0.00–1.30)
MONOCYTE %: 8 % (ref 0–12)
MPV: 8.8 fL (ref 7.4–10.4)
NEUTROPHIL #: 9.52 10*3/uL — ABNORMAL HIGH (ref 1.80–8.40)
NEUTROPHIL %: 85 % — ABNORMAL HIGH (ref 40–76)
PLATELETS: 312 10*3/uL (ref 140–440)
RBC: 3.33 10*6/uL — ABNORMAL LOW (ref 4.20–5.40)
RDW: 23.3 % — ABNORMAL HIGH (ref 11.6–14.8)
WBC: 11.3 10*3/uL — ABNORMAL HIGH (ref 4.0–10.5)

## 2022-03-24 LAB — GOLD TOP TUBE

## 2022-03-24 LAB — GRAY TOP TUBE

## 2022-03-24 LAB — ECG 12 LEAD
Atrial Rate: 89 {beats}/min
Calculated P Axis: 71 degrees
Calculated R Axis: 69 degrees
Calculated T Axis: -48 degrees
PR Interval: 232 ms
QRS Duration: 118 ms
QT Interval: 424 ms
QTC Calculation: 515 ms
Ventricular rate: 89 {beats}/min

## 2022-03-24 LAB — PT/INR
INR: 1.05 (ref 0.88–1.10)
PROTHROMBIN TIME: 12.2 seconds (ref 9.8–12.7)

## 2022-03-24 LAB — BASIC METABOLIC PANEL
ANION GAP: 20 mmol/L — ABNORMAL HIGH (ref 4–13)
BUN/CREA RATIO: 5
BUN: 79 mg/dL — ABNORMAL HIGH (ref 7–18)
CALCIUM: 9.7 mg/dL (ref 8.5–10.1)
CHLORIDE: 92 mmol/L — ABNORMAL LOW (ref 98–107)
CO2 TOTAL: 23 mmol/L (ref 21–32)
CREATININE: 16.3 mg/dL — ABNORMAL HIGH (ref 0.55–1.02)
ESTIMATED GFR: 2 mL/min/{1.73_m2} — ABNORMAL LOW (ref >59)
GLUCOSE: 103 mg/dL (ref 74–106)
OSMOLALITY, CALCULATED: 294 mOsm/kg — ABNORMAL HIGH (ref 270–290)
POTASSIUM: 4.2 mmol/L (ref 3.5–5.1)
SODIUM: 135 mmol/L — ABNORMAL LOW (ref 136–145)

## 2022-03-24 LAB — TROPONIN-I
TROPONIN I: 58 ng/L — ABNORMAL HIGH (ref <15)
TROPONIN I: 60 ng/L — ABNORMAL HIGH (ref <15)

## 2022-03-24 MED ORDER — PROCHLORPERAZINE EDISYLATE 10 MG/2 ML (5 MG/ML) INJECTION SOLUTION
INTRAMUSCULAR | Status: AC
Start: 2022-03-24 — End: 2022-03-24
  Filled 2022-03-24: qty 2

## 2022-03-24 MED ORDER — FENTANYL (PF) 50 MCG/ML INJECTION SOLUTION
50.0000 ug | INTRAMUSCULAR | Status: AC
Start: 2022-03-24 — End: 2022-03-24
  Administered 2022-03-24: 50 ug via INTRAVENOUS

## 2022-03-24 MED ORDER — FENTANYL (PF) 50 MCG/ML INJECTION SOLUTION
INTRAMUSCULAR | Status: AC
Start: 2022-03-24 — End: 2022-03-24
  Filled 2022-03-24: qty 2

## 2022-03-24 MED ORDER — CLONIDINE HCL 0.2 MG TABLET
0.2000 mg | ORAL_TABLET | ORAL | Status: AC
Start: 2022-03-24 — End: 2022-03-24
  Administered 2022-03-24: 0.2 mg via ORAL

## 2022-03-24 MED ORDER — DEXAMETHASONE SODIUM PHOSPHATE (PF) 10 MG/ML INJECTION SOLUTION
INTRAMUSCULAR | Status: AC
Start: 2022-03-24 — End: 2022-03-24
  Filled 2022-03-24: qty 1

## 2022-03-24 MED ORDER — HYDROCODONE 5 MG-ACETAMINOPHEN 325 MG TABLET
2.0000 | ORAL_TABLET | ORAL | Status: AC
Start: 2022-03-24 — End: 2022-03-24
  Administered 2022-03-24: 2 via ORAL

## 2022-03-24 MED ORDER — HYDROCODONE 5 MG-ACETAMINOPHEN 325 MG TABLET
ORAL_TABLET | ORAL | Status: AC
Start: 2022-03-24 — End: 2022-03-24
  Filled 2022-03-24: qty 2

## 2022-03-24 MED ORDER — CLONIDINE HCL 0.2 MG TABLET
ORAL_TABLET | ORAL | Status: AC
Start: 2022-03-24 — End: 2022-03-24
  Filled 2022-03-24: qty 1

## 2022-03-24 MED ORDER — FUROSEMIDE 10 MG/ML INJECTION SOLUTION
100.0000 mg | INTRAMUSCULAR | Status: AC
Start: 2022-03-24 — End: 2022-03-24
  Administered 2022-03-24: 100 mg via INTRAVENOUS

## 2022-03-24 MED ORDER — PROCHLORPERAZINE EDISYLATE 10 MG/2 ML (5 MG/ML) INJECTION SOLUTION
10.0000 mg | INTRAMUSCULAR | Status: AC
Start: 2022-03-24 — End: 2022-03-24
  Administered 2022-03-24: 10 mg via INTRAVENOUS

## 2022-03-24 MED ORDER — IPRATROPIUM 0.5 MG-ALBUTEROL 3 MG (2.5 MG BASE)/3 ML NEBULIZATION SOLN
3.0000 mL | INHALATION_SOLUTION | RESPIRATORY_TRACT | Status: AC
Start: 2022-03-24 — End: 2022-03-24
  Administered 2022-03-24: 3 mL via RESPIRATORY_TRACT

## 2022-03-24 MED ORDER — DEXAMETHASONE SODIUM PHOSPHATE (PF) 10 MG/ML INJECTION SOLUTION
10.0000 mg | INTRAMUSCULAR | Status: AC
Start: 2022-03-24 — End: 2022-03-24
  Administered 2022-03-24: 10 mg via INTRAVENOUS

## 2022-03-24 MED ORDER — FUROSEMIDE 10 MG/ML INJECTION SOLUTION
INTRAMUSCULAR | Status: AC
Start: 2022-03-24 — End: 2022-03-24
  Filled 2022-03-24: qty 10

## 2022-03-24 MED ORDER — IPRATROPIUM 0.5 MG-ALBUTEROL 3 MG (2.5 MG BASE)/3 ML NEBULIZATION SOLN
INHALATION_SOLUTION | RESPIRATORY_TRACT | Status: AC
Start: 2022-03-24 — End: 2022-03-24
  Filled 2022-03-24: qty 3

## 2022-03-24 NOTE — ED Nurses Note (Signed)
PT states she is working on a ride home. States she has dialysis at 1130am today and usually rides bus there but has to go home first and get her electric wheel chair.

## 2022-03-24 NOTE — ED Triage Notes (Signed)
EMS reports pt c/o chest pain since yesterday. + nausea . No vomiting. 4mg  IM Zofran given en route. VSS en route. Pt took 324mg  ASA, X 2 nitro at home PTA

## 2022-03-24 NOTE — ED Nurses Note (Signed)
Patient discharged home with family.  AVS reviewed with patient/care giver.  A written copy of the AVS and discharge instructions was given to the patient/care giver. Scripts handed to patient/care giver. Questions sufficiently answered as needed.  Patient/care giver encouraged to follow up with PCP as indicated.  In the event of an emergency, patient/care giver instructed to call 911 or go to the nearest emergency room.

## 2022-03-24 NOTE — Nursing Note (Signed)
Called Dialysis center in [place] w.v on 03/24/22 at 10:15 am informed Beth that this patient was discharged from [facility] ER on 03/24/22. Her Creat is 16.30  EGFR 2 she is suppose to have dialysis [place] at 11:30 am, spoke to beth at dialysis and she informed me that she will let me know if she comes. Also informed Dr. Liliane Shi about this patient on 03/24/22 at 10:30 am.

## 2022-03-24 NOTE — ED Attending Note (Signed)
[facility] emergency department         HISTORY OF PRESENT ILLNESS     Date:  03/24/2022  Patient's Name:  [name]  Date of Birth:  10-10-1975    Patient is a 47 year old presenting with chest pain anteriorly for the last 12-24 hours.  Patient missed dialysis on Friday due for dialysis this morning.  Patient took sublingual nitro x3 and aspirin home she was given Zofran by EMS.  She has in no respiratory distress.        Review of Systems     Review of Systems   Respiratory:  Positive for cough, chest tightness and shortness of breath.    Cardiovascular:  Positive for chest pain.   Musculoskeletal: Negative.    Neurological:  Positive for headaches.   All other systems reviewed and are negative.      Previous History     Past Medical History:  Past Medical History:   Diagnosis Date   . Asthma    . Chronic diastolic CHF (congestive heart failure) (CMS HCC)    . Dependence on supplemental oxygen    . Esophageal reflux    . ESRD (end stage renal disease) (CMS [place])    . History of anemia due to CKD    . MI (myocardial infarction) (CMS [place])    . Mitral valve regurgitation    . Pulmonary edema    . Sleep apnea    . 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 Tricuspid valve regurgitation    . Uncontrolled hypertension        Past Surgical History:  Past Surgical History:   Procedure Laterality Date   . Coronary artery angioplasty     . Esophagogastroduodenoscopy     . Hx back surgery     . Hx cholecystectomy     . Hx foot surgery Right    . Hx hysterectomy     . Hx tonsillectomy         Social History:  Social History     Tobacco Use   . Smoking status: Every Day     Packs/day: 1.00     Years: 10.00     Additional pack years: 0.00     Total pack years: 10.00     Types: Cigarettes     Passive exposure: Past   . Smokeless tobacco: Former   . Tobacco comments:     "Haven't had a cigerette in 2 weeks."   Vaping Use   .  Vaping Use: Never used   Substance Use Topics   . Alcohol use: Not Currently   . Drug use: Yes     Types: Marijuana     Social History     Substance and Sexual Activity   Drug Use Yes   . Types: Marijuana       Family History:  Family History   Problem Relation Age of Onset   . Diabetes type II Mother    . Coronary Artery Disease Mother    . Breast Cancer Mother    . Hypertension (High Blood Pressure) Father        Medication History:  Current Outpatient Medications   Medication Sig   . amLODIPine (NORVASC) 10 mg Oral Tablet Take 1 Tablet (10 mg total) by mouth Once a day Take one tablet every day   . aspirin (ECOTRIN) 81 mg  Oral Tablet, Delayed Release (E.C.) Take 1 Tablet (81 mg total) by mouth Once a day   . busPIRone (BUSPAR) 10 mg Oral Tablet Take 1 Tablet (10 mg total) by mouth Three times a day   . cloNIDine HCL (CATAPRES) 0.2 mg Oral Tablet Take 1 Tablet (0.2 mg total) by mouth Twice daily Take one tablet twice a day   . dicyclomine (BENTYL) 10 mg Oral Capsule Take 1 Capsule (10 mg total) by mouth Three times a day   . doxazosin (CARDURA) 4 mg Oral Tablet Take 1 Tablet (4 mg total) by mouth Every evening   . doxycycline monohydrate (MONODOX) 100 mg Oral Capsule Take 1 Capsule (100 mg total) by mouth Twice daily   . famotidine (PEPCID) 20 mg Oral Tablet Take 1 Tablet (20 mg total) by mouth Twice daily   . labetaloL (NORMODYNE) 200 mg Oral Tablet Take 4 Tablets (800 mg total) by mouth Three times a day   . nitroGLYCERIN (NITROSTAT) 0.4 mg Sublingual Tablet, Sublingual Place 1 Tablet (0.4 mg total) under the tongue Every 5 minutes as needed for Chest pain for up to 3 doses for 3 doses over 15 minutes   . omeprazole (PRILOSEC) 40 mg Oral Capsule, Delayed Release(E.C.) Take 1 Capsule (40 mg total) by mouth Once a day   . ondansetron (ZOFRAN ODT) 4 mg Oral Tablet, Rapid Dissolve Take 1 Tablet (4 mg total) by mouth Every 8 hours as needed for Nausea/Vomiting   . predniSONE (DELTASONE) 20 mg Oral Tablet Take 1  Tablet (20 mg total) by mouth Twice daily for 5 days   . rosuvastatin (CRESTOR) 40 mg Oral Tablet Take 1 Tablet (40 mg total) by mouth Every evening   . sucralfate (CARAFATE) 1 gram Oral Tablet Take 1 Tablet (1 g total) by mouth Every 6 hours   . ticagrelor (BRILINTA) 90 mg Oral Tablet Take 1 Tablet (90 mg total) by mouth Twice daily       Allergies:  Allergies   Allergen Reactions   . Lisinopril Swelling     Tongue and throat swelling   . Cardene [Nicardipine]  Other Adverse Reaction (Add comment)     Chest pain    . Oxycodone Itching       Physical Exam     Vitals:    BP (!) 196/104   Pulse 87   Temp 36.3 C (97.4 F)   Resp 17   Ht 1.727 m (5\' 8" )   Wt 81.6 kg (180 lb)   SpO2 97%   BMI 27.37 kg/m           Physical Exam  Vitals and nursing note reviewed.   Constitutional:       General: She is not in acute distress.     Appearance: She is well-developed.   HENT:      Head: Normocephalic and atraumatic.   Eyes:      Conjunctiva/sclera: Conjunctivae normal.   Cardiovascular:      Rate and Rhythm: Normal rate and regular rhythm.      Heart sounds: No murmur heard.  Pulmonary:      Effort: Pulmonary effort is normal. No respiratory distress.      Breath sounds: Wheezing and rhonchi present.   Abdominal:      Palpations: Abdomen is soft.      Tenderness: There is no abdominal tenderness.   Musculoskeletal:         General: No swelling. Normal range of motion.  Cervical back: Neck supple.   Skin:     General: Skin is warm and dry.      Capillary Refill: Capillary refill takes less than 2 seconds.   Neurological:      Mental Status: She is alert and oriented to person, place, and time.   Psychiatric:         Mood and Affect: Mood normal.         Diagnostic Studies/Treatment     Medications:  Medications Administered in the ED   dexAMETHasone (PF) 10 mg/mL injection (has no administration in time range)   cloNIDine (CATAPRES) tablet (has no administration in time range)   furosemide (LASIX) 10 mg/mL injection  (has no administration in time range)   HYDROcodone-acetaminophen (NORCO) 5-325 mg per tablet (has no administration in time range)   ipratropium-albuterol 0.5 mg-3 mg(2.5 mg base)/3 mL Solution for Nebulization (3 mL Nebulization Given 03/24/22 0208)       New Prescriptions    No medications on file       Labs:    No results found for this or any previous visit (from the past 12 hour(s)).     Radiology:  XR CHEST AP    XR CHEST AP    (Results Pending)       ECG:   Normal sinus rhythm first-degree AV block rate 89 beats per minute PR interval 232 nonspecific ST-T changes            Differential diagnosis  Noncardiac chest pain, end-stage renal disease on hemodialysis congestive heart failure    Course/Disposition/Plan     Course:     ED Course as of 03/24/22 0429   Mon Mar 24, 2022   0428 Elevated troponin is chronic for this patient  awaiting repeat troponin level     Patient with history of chronic pain chest wall.  End-stage renal disease on hemodialysis missed dialysis on Friday due for dialysis today  Disposition:    Discharged    Condition at Disposition:   Stable    Follow up:   Cam Hai, [place]  [place] [address]  [phone]    Schedule an appointment as soon as possible for a visit   Follow-up for dialysis at 11:00 a.m. today      Clinical Impression:     Clinical Impression   Chest pain, unspecified type (Primary)   End-stage renal disease (ESRD) (CMS [facility])   Hypertension, unspecified type         Winfred Burn, MD

## 2022-03-26 ENCOUNTER — Encounter (INDEPENDENT_AMBULATORY_CARE_PROVIDER_SITE_OTHER): Payer: Medicare Other | Admitting: Nephrology

## 2022-03-28 ENCOUNTER — Emergency Department
Admission: EM | Admit: 2022-03-28 | Discharge: 2022-03-28 | Disposition: A | Discharge status: Home / Self Care | Payer: Medicare Other | Attending: Emergency Medicine | Admitting: Emergency Medicine

## 2022-03-28 ENCOUNTER — Other Ambulatory Visit: Payer: Self-pay

## 2022-03-28 ENCOUNTER — Emergency Department (HOSPITAL_BASED_OUTPATIENT_CLINIC_OR_DEPARTMENT_OTHER): Payer: Medicare Other

## 2022-03-28 ENCOUNTER — Encounter (HOSPITAL_BASED_OUTPATIENT_CLINIC_OR_DEPARTMENT_OTHER): Payer: Self-pay

## 2022-03-28 DIAGNOSIS — Z992 Dependence on renal dialysis: Secondary | ICD-10-CM | POA: Insufficient documentation

## 2022-03-28 DIAGNOSIS — F17210 Nicotine dependence, cigarettes, uncomplicated: Secondary | ICD-10-CM | POA: Insufficient documentation

## 2022-03-28 DIAGNOSIS — R0602 Shortness of breath: Secondary | ICD-10-CM | POA: Insufficient documentation

## 2022-03-28 DIAGNOSIS — I120 Hypertensive chronic kidney disease with stage 5 chronic kidney disease or end stage renal disease: Secondary | ICD-10-CM

## 2022-03-28 DIAGNOSIS — I454 Nonspecific intraventricular block: Secondary | ICD-10-CM | POA: Insufficient documentation

## 2022-03-28 DIAGNOSIS — G8929 Other chronic pain: Secondary | ICD-10-CM | POA: Insufficient documentation

## 2022-03-28 DIAGNOSIS — J811 Chronic pulmonary edema: Secondary | ICD-10-CM | POA: Insufficient documentation

## 2022-03-28 DIAGNOSIS — N186 End stage renal disease: Secondary | ICD-10-CM | POA: Insufficient documentation

## 2022-03-28 DIAGNOSIS — I440 Atrioventricular block, first degree: Secondary | ICD-10-CM | POA: Insufficient documentation

## 2022-03-28 DIAGNOSIS — R079 Chest pain, unspecified: Secondary | ICD-10-CM | POA: Insufficient documentation

## 2022-03-28 LAB — CBC WITH DIFF
BASOPHIL #: 0.01 10*3/uL (ref 0.00–0.30)
BASOPHIL %: 0 % (ref 0–3)
EOSINOPHIL #: 0.03 10*3/uL (ref 0.00–0.80)
EOSINOPHIL %: 0 % (ref 0–7)
HCT: 28.1 % — ABNORMAL LOW (ref 37.0–47.0)
HGB: 9 g/dL — ABNORMAL LOW (ref 12.5–16.0)
LYMPHOCYTE #: 0.23 10*3/uL — ABNORMAL LOW (ref 1.10–5.00)
LYMPHOCYTE %: 2 % — ABNORMAL LOW (ref 25–45)
MCH: 30 pg (ref 27.0–32.0)
MCHC: 32 g/dL (ref 32.0–36.0)
MCV: 93.5 fL (ref 78.0–99.0)
MONOCYTE #: 0.25 10*3/uL (ref 0.00–1.30)
MONOCYTE %: 3 % (ref 0–12)
MPV: 9.4 fL (ref 7.4–10.4)
NEUTROPHIL #: 8.58 10*3/uL — ABNORMAL HIGH (ref 1.80–8.40)
NEUTROPHIL %: 94 % — ABNORMAL HIGH (ref 40–76)
PLATELETS: 304 10*3/uL (ref 140–440)
RBC: 3 10*6/uL — ABNORMAL LOW (ref 4.20–5.40)
RDW: 21.8 % — ABNORMAL HIGH (ref 11.6–14.8)
WBC: 9.1 10*3/uL (ref 4.0–10.5)

## 2022-03-28 LAB — COMPREHENSIVE METABOLIC PANEL, NON-FASTING
ALBUMIN/GLOBULIN RATIO: 0.8 (ref 0.8–1.4)
ALBUMIN: 3.5 g/dL (ref 3.4–5.0)
ALKALINE PHOSPHATASE: 89 U/L (ref 46–116)
ALT (SGPT): 13 U/L (ref <=78)
ANION GAP: 14 mmol/L — ABNORMAL HIGH (ref 4–13)
AST (SGOT): 20 U/L (ref 15–37)
BILIRUBIN TOTAL: 0.9 mg/dL (ref 0.2–1.0)
BUN/CREA RATIO: 4
BUN: 51 mg/dL — ABNORMAL HIGH (ref 7–18)
CALCIUM, CORRECTED: 9.3 mg/dL
CALCIUM: 8.9 mg/dL (ref 8.5–10.1)
CHLORIDE: 92 mmol/L — ABNORMAL LOW (ref 98–107)
CO2 TOTAL: 28 mmol/L (ref 21–32)
CREATININE: 12.04 mg/dL — ABNORMAL HIGH (ref 0.55–1.02)
ESTIMATED GFR: 4 mL/min/{1.73_m2} — ABNORMAL LOW (ref >59)
GLOBULIN: 4.6
GLUCOSE: 220 mg/dL — ABNORMAL HIGH (ref 74–106)
OSMOLALITY, CALCULATED: 289 mOsm/kg (ref 270–290)
POTASSIUM: 4.1 mmol/L (ref 3.5–5.1)
PROTEIN TOTAL: 8.1 g/dL (ref 6.4–8.2)
SODIUM: 134 mmol/L — ABNORMAL LOW (ref 136–145)

## 2022-03-28 LAB — PT/INR
INR: 1.04 (ref 0.88–1.10)
PROTHROMBIN TIME: 12.1 seconds (ref 9.8–12.7)

## 2022-03-28 LAB — TROPONIN-I
TROPONIN I: 51 ng/L — ABNORMAL HIGH (ref <15)
TROPONIN I: 45 ng/L — ABNORMAL HIGH (ref <15)

## 2022-03-28 LAB — PTT (PARTIAL THROMBOPLASTIN TIME): APTT: 45 seconds — ABNORMAL HIGH (ref 22.0–31.7)

## 2022-03-28 MED ORDER — FENTANYL (PF) 50 MCG/ML INJECTION SOLUTION
INTRAMUSCULAR | Status: AC
Start: 2022-03-28 — End: 2022-03-28
  Filled 2022-03-28: qty 2

## 2022-03-28 MED ORDER — PROCHLORPERAZINE EDISYLATE 10 MG/2 ML (5 MG/ML) INJECTION SOLUTION
INTRAMUSCULAR | Status: AC
Start: 2022-03-28 — End: 2022-03-28
  Filled 2022-03-28: qty 2

## 2022-03-28 MED ORDER — NITROGLYCERIN 2 % TRANSDERMAL OINTMENT - PACKET
0.5000 [in_us] | TOPICAL_OINTMENT | TRANSDERMAL | Status: AC
Start: 2022-03-28 — End: 2022-03-28
  Administered 2022-03-28: 0.5 [in_us] via TOPICAL

## 2022-03-28 MED ORDER — PANTOPRAZOLE 40 MG INTRAVENOUS SOLUTION
40.0000 mg | INTRAVENOUS | Status: AC
Start: 2022-03-28 — End: 2022-03-28
  Administered 2022-03-28: 40 mg via INTRAVENOUS

## 2022-03-28 MED ORDER — FENTANYL (PF) 50 MCG/ML INJECTION SOLUTION
50.0000 ug | INTRAMUSCULAR | Status: AC
Start: 2022-03-28 — End: 2022-03-28
  Administered 2022-03-28: 50 ug via INTRAVENOUS

## 2022-03-28 MED ORDER — NITROGLYCERIN 2 % TRANSDERMAL OINTMENT - PACKET
TOPICAL_OINTMENT | TRANSDERMAL | Status: AC
Start: 2022-03-28 — End: 2022-03-28
  Filled 2022-03-28: qty 1

## 2022-03-28 MED ORDER — PROCHLORPERAZINE EDISYLATE 10 MG/2 ML (5 MG/ML) INJECTION SOLUTION
10.0000 mg | INTRAMUSCULAR | Status: AC
Start: 2022-03-28 — End: 2022-03-28
  Administered 2022-03-28: 10 mg via INTRAVENOUS

## 2022-03-28 MED ORDER — FUROSEMIDE 10 MG/ML INJECTION SOLUTION
100.0000 mg | INTRAMUSCULAR | Status: AC
Start: 2022-03-28 — End: 2022-03-28
  Administered 2022-03-28: 100 mg via INTRAVENOUS

## 2022-03-28 MED ORDER — FUROSEMIDE 10 MG/ML INJECTION SOLUTION
INTRAMUSCULAR | Status: AC
Start: 2022-03-28 — End: 2022-03-28
  Filled 2022-03-28: qty 10

## 2022-03-28 MED ORDER — PANTOPRAZOLE 40 MG INTRAVENOUS SOLUTION
INTRAVENOUS | Status: AC
Start: 2022-03-28 — End: 2022-03-28
  Filled 2022-03-28: qty 10

## 2022-03-28 NOTE — ED Triage Notes (Signed)
EMS reports CP X 1-2 hous ago. Pt self took 324mg  ASA, EMS gave X 1 nitro. VSS en route

## 2022-03-28 NOTE — ED Attending Note (Addendum)
[facility] emergency department         HISTORY OF PRESENT ILLNESS     Date:  03/28/2022  Patient's Name:  [name]  Date of Birth:  [birth year]    Patient is a 47 year old presenting to the emergency room with chest pain anteriorly beginning around 10:00 p.m. last night.  Patient was dialyzed last on Wednesday.  Patient did take aspirin and sublingual nitro prior to coming to the ER patient with multiple ER visits related to chest wall pain and progressive renal disease.        Review of Systems     Review of Systems   HENT: Negative.     Eyes: Negative.    Respiratory:  Positive for shortness of breath.    Cardiovascular:  Positive for chest pain.   Gastrointestinal: Negative.    Neurological: Negative.    Psychiatric/Behavioral: Negative.     All other systems reviewed and are negative.      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    Tricuspid valve regurgitation     Uncontrolled hypertension        Past Surgical History:  Past Surgical History:   Procedure Laterality Date    Coronary artery angioplasty      Esophagogastroduodenoscopy      Hx back surgery      Hx cholecystectomy      Hx foot surgery Right     Hx hysterectomy      Hx tonsillectomy         Social History:  Social History     Tobacco Use    Smoking status: Every Day     Packs/day: 1.00     Years: 10.00     Additional pack years: 0.00     Total pack years: 10.00     Types: Cigarettes     Passive exposure: Past    Smokeless tobacco: Former    Tobacco  comments:     "Haven't had a cigerette in 2 weeks."   Vaping Use    Vaping Use: Never used   Substance Use Topics    Alcohol use: Not Currently    Drug use: Yes     Types: Marijuana     Social History     Substance and Sexual Activity   Drug Use Yes    Types: Marijuana       Family History:  Family History   Problem Relation Age of Onset    Diabetes type II Mother     Coronary Artery Disease Mother     Breast Cancer Mother     Hypertension (High Blood Pressure) Father        Medication History:  Current Outpatient Medications   Medication Sig    amLODIPine (NORVASC) 10 mg Oral Tablet Take 1 Tablet (10 mg  total) by mouth Once a day Take one tablet every day    aspirin (ECOTRIN) 81 mg Oral Tablet, Delayed Release (E.C.) Take 1 Tablet (81 mg total) by mouth Once a day    busPIRone (BUSPAR) 10 mg Oral Tablet Take 1 Tablet (10 mg total) by mouth Three times a day    cloNIDine HCL (CATAPRES) 0.2 mg Oral Tablet Take 1 Tablet (0.2 mg total) by mouth Twice daily Take one tablet twice a day    dicyclomine (BENTYL) 10 mg Oral Capsule Take 1 Capsule (10 mg total) by mouth Three times a day    doxazosin (CARDURA) 4 mg Oral Tablet Take 1 Tablet (4 mg total) by mouth Every evening    doxycycline monohydrate (MONODOX) 100 mg Oral Capsule Take 1 Capsule (100 mg total) by mouth Twice daily    famotidine (PEPCID) 20 mg Oral Tablet Take 1 Tablet (20 mg total) by mouth Twice daily    labetaloL (NORMODYNE) 200 mg Oral Tablet Take 4 Tablets (800 mg total) by mouth Three times a day    nitroGLYCERIN (NITROSTAT) 0.4 mg Sublingual Tablet, Sublingual Place 1 Tablet (0.4 mg total) under the tongue Every 5 minutes as needed for Chest pain for up to 3 doses for 3 doses over 15 minutes    omeprazole (PRILOSEC) 40 mg Oral Capsule, Delayed Release(E.C.) Take 1 Capsule (40 mg total) by mouth Once a day    ondansetron (ZOFRAN ODT) 4 mg Oral Tablet, Rapid Dissolve Take 1 Tablet (4 mg total) by mouth Every 8 hours as needed for Nausea/Vomiting     rosuvastatin (CRESTOR) 40 mg Oral Tablet Take 1 Tablet (40 mg total) by mouth Every evening    sucralfate (CARAFATE) 1 gram Oral Tablet Take 1 Tablet (1 g total) by mouth Every 6 hours    ticagrelor (BRILINTA) 90 mg Oral Tablet Take 1 Tablet (90 mg total) by mouth Twice daily       Allergies:  Allergies   Allergen Reactions    Lisinopril Swelling     Tongue and throat swelling    Cardene [Nicardipine]  Other Adverse Reaction (Add comment)     Chest pain     Oxycodone Itching       Physical Exam     Vitals:    BP (!) 150/94   Pulse 72   Temp 36.6 C (97.8 F)   Resp (!) 21   Ht 1.702 m (5\' 7" )   Wt 81.4 kg (179 lb 7.3 oz)   SpO2 100%   BMI 28.11 kg/m           Physical Exam  Vitals and nursing note reviewed.   Constitutional:       General: She is not in acute distress.     Appearance: She is well-developed.   HENT:      Head: Normocephalic and atraumatic.   Eyes:      Conjunctiva/sclera: Conjunctivae normal.   Cardiovascular:      Rate and Rhythm: Normal rate and regular rhythm.      Heart sounds: No murmur heard.  Pulmonary:      Effort: Pulmonary effort is normal. No respiratory distress.      Breath sounds: Normal breath sounds.   Abdominal:      General: Bowel sounds are normal.      Palpations: Abdomen is soft.      Tenderness: There is no abdominal tenderness.   Musculoskeletal:         General: No swelling.  Cervical back: Neck supple.   Skin:     General: Skin is warm and dry.      Capillary Refill: Capillary refill takes less than 2 seconds.   Neurological:      Mental Status: She is alert.   Psychiatric:         Mood and Affect: Mood normal.         Diagnostic Studies/Treatment     Medications:  Medications Administered in the ED   furosemide (LASIX) 10 mg/mL injection (100 mg Intravenous Given 03/28/22 0321)   pantoprazole (PROTONIX) injection (40 mg Intravenous Given 03/28/22 0323)   prochlorperazine (COMPAZINE) 5 mg/mL injection (10 mg Intravenous Given 03/28/22 0322)   fentaNYL (SUBLIMAZE) 50  mcg/mL injection (50 mcg Intravenous Given 03/28/22 0323)   nitroGLYCERIN (NITRO-BID) 2 % topical ointment (0.5 Inches Apply Topically Given 03/28/22 0316)       New Prescriptions    No medications on file       Labs:    Results for orders placed or performed during the hospital encounter of 03/28/22 (from the past 12 hour(s))   COMPREHENSIVE METABOLIC PANEL, NON-FASTING   Result Value Ref Range    SODIUM 134 (L) 136 - 145 mmol/L    POTASSIUM 4.1 3.5 - 5.1 mmol/L    CHLORIDE 92 (L) 98 - 107 mmol/L    CO2 TOTAL 28 21 - 32 mmol/L    ANION GAP 14 (H) 4 - 13 mmol/L    BUN 51 (H) 7 - 18 mg/dL    CREATININE 12.04 (H) 0.55 - 1.02 mg/dL    BUN/CREA RATIO 4     ESTIMATED GFR 4 (L) >59 mL/min/1.28m^2    ALBUMIN 3.5 3.4 - 5.0 g/dL    CALCIUM 8.9 8.5 - 10.1 mg/dL    GLUCOSE 220 (H) 74 - 106 mg/dL    ALKALINE PHOSPHATASE 89 46 - 116 U/L    ALT (SGPT) 13 <=78 U/L    AST (SGOT) 20 15 - 37 U/L    BILIRUBIN TOTAL 0.9 0.2 - 1.0 mg/dL    PROTEIN TOTAL 8.1 6.4 - 8.2 g/dL    ALBUMIN/GLOBULIN RATIO 0.8 0.8 - 1.4    OSMOLALITY, CALCULATED 289 270 - 290 mOsm/kg    CALCIUM, CORRECTED 9.3 mg/dL    GLOBULIN 4.6    PT/INR   Result Value Ref Range    PROTHROMBIN TIME 12.1 9.8 - 12.7 seconds    INR 1.04 0.88 - 1.10   PTT (PARTIAL THROMBOPLASTIN TIME)   Result Value Ref Range    APTT 45.0 (H) 22.0 - 31.7 seconds   TROPONIN-I (Q3H X 3)   Result Value Ref Range    TROPONIN I 51 (H) <15 ng/L        Radiology:  XR CHEST AP    XR CHEST AP    (Results Pending)       ECG:     Normal sinus rhythm first-degree AV block 74 beats per minute nonspecific ST-T change          Differential diagnosis  Congestive heart failure, uncontrolled hypertension, end-stage renal disease, chest pain, end-stage renal disease on hemodialysis    Course/Disposition/Plan     Course:     ED Course as of 03/28/22 0449   Fri Mar 28, 2022   0446 Patient is pain-free presently in no discomfort.  Patient is requesting to be discharged home.  Patient states she has had multiple ER visits  for this chronic pain and is due for  dialysis this morning.     Serial troponins to be done chest x-ray baseline labs ordered.  Patient with chronic renal failure multiple ER visits related to the same complaint  Disposition:    Discharged    Condition at Disposition:       Follow up:   Cam Hai, [place]  [place] [address]  [phone]    Schedule an appointment as soon as possible for a visit   Follow-up with dialysis today as planned      Clinical Impression:     Clinical Impression   Chest pain, unspecified type (Primary)   End-stage renal disease (ESRD) (CMS [facility])   Pulmonary edema         Winfred Burn, MD

## 2022-03-28 NOTE — ED Nurses Note (Signed)
Pt c/o of chest pain that began around 2300. Pt states pain started while at rest, cramping in center of chest radiating to L side of chest. Pt also report epigastric pain w/ nausea denies vomiting. Respirations are unlabored w/ no s/s of acute distress noted at this time. Pt placed on monitor.

## 2022-03-28 NOTE — Discharge Instructions (Signed)
Please follow-up for dialysis today as planned

## 2022-03-28 NOTE — ED Nurses Note (Signed)
Patient discharged home with family.  AVS reviewed with patient/care giver.  A written copy of the AVS and discharge instructions was given to the patient/care giver. Scripts handed to patient/care giver. Questions sufficiently answered as needed.  Patient/care giver encouraged to follow up with PCP as indicated.  In the event of an emergency, patient/care giver instructed to call 911 or go to the nearest emergency room.

## 2022-03-29 DIAGNOSIS — R9431 Abnormal electrocardiogram [ECG] [EKG]: Secondary | ICD-10-CM

## 2022-03-29 DIAGNOSIS — I440 Atrioventricular block, first degree: Secondary | ICD-10-CM

## 2022-03-29 DIAGNOSIS — I454 Nonspecific intraventricular block: Secondary | ICD-10-CM

## 2022-03-29 LAB — ECG 12 LEAD
Atrial Rate: 74 {beats}/min
Calculated P Axis: 73 degrees
Calculated R Axis: -16 degrees
Calculated T Axis: 90 degrees
PR Interval: 234 ms
QRS Duration: 126 ms
QT Interval: 486 ms
QTC Calculation: 539 ms
Ventricular rate: 74 {beats}/min

## 2022-03-31 ENCOUNTER — Emergency Department (HOSPITAL_COMMUNITY): Payer: Medicare Other

## 2022-03-31 ENCOUNTER — Other Ambulatory Visit: Payer: Self-pay

## 2022-03-31 ENCOUNTER — Inpatient Hospital Stay (HOSPITAL_COMMUNITY): Payer: Medicare Other | Admitting: Internal Medicine

## 2022-03-31 ENCOUNTER — Emergency Department (EMERGENCY_DEPARTMENT_HOSPITAL)
Admission: EM | Admit: 2022-03-31 | Discharge: 2022-03-31 | Disposition: A | Discharge status: Home / Self Care | Payer: Medicare Other | Source: Home / Self Care | Attending: Emergency Medicine | Admitting: Emergency Medicine

## 2022-03-31 ENCOUNTER — Inpatient Hospital Stay
Admission: EM | Admit: 2022-03-31 | Discharge: 2022-04-25 | DRG: 640 | Disposition: E | Payer: Medicare Other | Attending: HOSPITALIST | Admitting: HOSPITALIST

## 2022-03-31 ENCOUNTER — Encounter (HOSPITAL_BASED_OUTPATIENT_CLINIC_OR_DEPARTMENT_OTHER): Payer: Self-pay

## 2022-03-31 ENCOUNTER — Emergency Department (HOSPITAL_BASED_OUTPATIENT_CLINIC_OR_DEPARTMENT_OTHER): Payer: Medicare Other

## 2022-03-31 DIAGNOSIS — Z86718 Personal history of other venous thrombosis and embolism: Secondary | ICD-10-CM

## 2022-03-31 DIAGNOSIS — J189 Pneumonia, unspecified organism: Secondary | ICD-10-CM

## 2022-03-31 DIAGNOSIS — I252 Old myocardial infarction: Secondary | ICD-10-CM

## 2022-03-31 DIAGNOSIS — Z79899 Other long term (current) drug therapy: Secondary | ICD-10-CM

## 2022-03-31 DIAGNOSIS — R918 Other nonspecific abnormal finding of lung field: Secondary | ICD-10-CM

## 2022-03-31 DIAGNOSIS — E118 Type 2 diabetes mellitus with unspecified complications: Secondary | ICD-10-CM

## 2022-03-31 DIAGNOSIS — K5900 Constipation, unspecified: Secondary | ICD-10-CM | POA: Diagnosis present

## 2022-03-31 DIAGNOSIS — R112 Nausea with vomiting, unspecified: Secondary | ICD-10-CM | POA: Insufficient documentation

## 2022-03-31 DIAGNOSIS — I5032 Chronic diastolic (congestive) heart failure: Secondary | ICD-10-CM | POA: Diagnosis present

## 2022-03-31 DIAGNOSIS — Z91158 Patient's noncompliance with renal dialysis for other reason: Secondary | ICD-10-CM

## 2022-03-31 DIAGNOSIS — D631 Anemia in chronic kidney disease: Secondary | ICD-10-CM | POA: Diagnosis present

## 2022-03-31 DIAGNOSIS — N185 Chronic kidney disease, stage 5: Secondary | ICD-10-CM

## 2022-03-31 DIAGNOSIS — F17200 Nicotine dependence, unspecified, uncomplicated: Secondary | ICD-10-CM | POA: Insufficient documentation

## 2022-03-31 DIAGNOSIS — Z992 Dependence on renal dialysis: Secondary | ICD-10-CM

## 2022-03-31 DIAGNOSIS — Z9861 Coronary angioplasty status: Secondary | ICD-10-CM

## 2022-03-31 DIAGNOSIS — N186 End stage renal disease: Secondary | ICD-10-CM | POA: Diagnosis present

## 2022-03-31 DIAGNOSIS — D649 Anemia, unspecified: Secondary | ICD-10-CM | POA: Diagnosis present

## 2022-03-31 DIAGNOSIS — R1013 Epigastric pain: Secondary | ICD-10-CM | POA: Insufficient documentation

## 2022-03-31 DIAGNOSIS — I132 Hypertensive heart and chronic kidney disease with heart failure and with stage 5 chronic kidney disease, or end stage renal disease: Secondary | ICD-10-CM | POA: Diagnosis present

## 2022-03-31 DIAGNOSIS — E8770 Fluid overload, unspecified: Principal | ICD-10-CM | POA: Diagnosis present

## 2022-03-31 DIAGNOSIS — I081 Rheumatic disorders of both mitral and tricuspid valves: Secondary | ICD-10-CM | POA: Diagnosis present

## 2022-03-31 DIAGNOSIS — Z1152 Encounter for screening for COVID-19: Secondary | ICD-10-CM | POA: Insufficient documentation

## 2022-03-31 DIAGNOSIS — Z9981 Dependence on supplemental oxygen: Secondary | ICD-10-CM

## 2022-03-31 DIAGNOSIS — R0602 Shortness of breath: Secondary | ICD-10-CM

## 2022-03-31 DIAGNOSIS — J811 Chronic pulmonary edema: Secondary | ICD-10-CM | POA: Diagnosis present

## 2022-03-31 DIAGNOSIS — J45909 Unspecified asthma, uncomplicated: Secondary | ICD-10-CM | POA: Diagnosis present

## 2022-03-31 DIAGNOSIS — Z7982 Long term (current) use of aspirin: Secondary | ICD-10-CM

## 2022-03-31 DIAGNOSIS — I871 Compression of vein: Secondary | ICD-10-CM | POA: Diagnosis present

## 2022-03-31 DIAGNOSIS — F17210 Nicotine dependence, cigarettes, uncomplicated: Secondary | ICD-10-CM | POA: Diagnosis present

## 2022-03-31 DIAGNOSIS — W19XXXA Unspecified fall, initial encounter: Secondary | ICD-10-CM | POA: Diagnosis not present

## 2022-03-31 DIAGNOSIS — K219 Gastro-esophageal reflux disease without esophagitis: Secondary | ICD-10-CM | POA: Diagnosis present

## 2022-03-31 DIAGNOSIS — G4730 Sleep apnea, unspecified: Secondary | ICD-10-CM | POA: Diagnosis present

## 2022-03-31 DIAGNOSIS — R001 Bradycardia, unspecified: Secondary | ICD-10-CM | POA: Diagnosis not present

## 2022-03-31 LAB — URINALYSIS, MICROSCOPIC

## 2022-03-31 LAB — URINALYSIS, MACRO/MICRO
BILIRUBIN: NEGATIVE mg/dL
BLOOD: NEGATIVE mg/dL
GLUCOSE: NEGATIVE mg/dL
KETONES: NEGATIVE mg/dL
NITRITE: NEGATIVE
PH: 6.5 (ref 4.6–8.0)
PROTEIN: 30 mg/dL — AB
SPECIFIC GRAVITY: 1.01 (ref 1.003–1.035)
UROBILINOGEN: 0.2 mg/dL (ref 0.2–1.0)

## 2022-03-31 LAB — CBC WITH DIFF
BASOPHIL #: 0.03 10*3/uL (ref 0.00–0.30)
BASOPHIL %: 0 % (ref 0–3)
EOSINOPHIL #: 0.06 10*3/uL (ref 0.00–0.80)
EOSINOPHIL %: 0 % (ref 0–7)
HCT: 26.1 % — ABNORMAL LOW (ref 37.0–47.0)
HGB: 8.3 g/dL — ABNORMAL LOW (ref 12.5–16.0)
LYMPHOCYTE #: 0.51 10*3/uL — ABNORMAL LOW (ref 1.10–5.00)
LYMPHOCYTE %: 3 % — ABNORMAL LOW (ref 25–45)
MCH: 29.8 pg (ref 27.0–32.0)
MCHC: 31.9 g/dL — ABNORMAL LOW (ref 32.0–36.0)
MCV: 93.5 fL (ref 78.0–99.0)
MONOCYTE #: 0.8 10*3/uL (ref 0.00–1.30)
MONOCYTE %: 5 % (ref 0–12)
MPV: 9.1 fL (ref 7.4–10.4)
NEUTROPHIL #: 14.58 10*3/uL — ABNORMAL HIGH (ref 1.80–8.40)
NEUTROPHIL %: 91 % — ABNORMAL HIGH (ref 40–76)
PLATELETS: 332 10*3/uL (ref 140–440)
RBC: 2.79 10*6/uL — ABNORMAL LOW (ref 4.20–5.40)
RDW: 21.7 % — ABNORMAL HIGH (ref 11.6–14.8)
WBC: 16 10*3/uL — ABNORMAL HIGH (ref 4.0–10.5)

## 2022-03-31 LAB — COMPREHENSIVE METABOLIC PANEL, NON-FASTING
ALBUMIN/GLOBULIN RATIO: 0.8 (ref 0.8–1.4)
ALBUMIN: 3.5 g/dL (ref 3.4–5.0)
ALKALINE PHOSPHATASE: 83 U/L (ref 46–116)
ALT (SGPT): 10 U/L (ref <=78)
ANION GAP: 18 mmol/L — ABNORMAL HIGH (ref 4–13)
AST (SGOT): 17 U/L (ref 15–37)
BILIRUBIN TOTAL: 0.7 mg/dL (ref 0.2–1.0)
BUN/CREA RATIO: 6
BUN: 80 mg/dL — ABNORMAL HIGH (ref 7–18)
CALCIUM, CORRECTED: 9.6 mg/dL
CALCIUM: 9.2 mg/dL (ref 8.5–10.1)
CHLORIDE: 95 mmol/L — ABNORMAL LOW (ref 98–107)
CO2 TOTAL: 24 mmol/L (ref 21–32)
CREATININE: 14.11 mg/dL — ABNORMAL HIGH (ref 0.55–1.02)
ESTIMATED GFR: 3 mL/min/{1.73_m2} — ABNORMAL LOW (ref >59)
GLOBULIN: 4.5
GLUCOSE: 101 mg/dL (ref 74–106)
OSMOLALITY, CALCULATED: 298 mOsm/kg — ABNORMAL HIGH (ref 270–290)
POTASSIUM: 3.8 mmol/L (ref 3.5–5.1)
PROTEIN TOTAL: 8 g/dL (ref 6.4–8.2)
SODIUM: 137 mmol/L (ref 136–145)

## 2022-03-31 LAB — BASIC METABOLIC PANEL
ANION GAP: 21 mmol/L — ABNORMAL HIGH (ref 4–13)
BUN/CREA RATIO: 6 (ref 6–22)
BUN: 78 mg/dL — ABNORMAL HIGH (ref 7–25)
CALCIUM: 9.5 mg/dL (ref 8.6–10.3)
CHLORIDE: 94 mmol/L — ABNORMAL LOW (ref 98–107)
CO2 TOTAL: 22 mmol/L (ref 21–31)
CREATININE: 13.28 mg/dL — ABNORMAL HIGH (ref 0.60–1.30)
ESTIMATED GFR: 3 mL/min/{1.73_m2} — ABNORMAL LOW (ref >59)
GLUCOSE: 87 mg/dL (ref 74–109)
OSMOLALITY, CALCULATED: 297 mOsm/kg — ABNORMAL HIGH (ref 270–290)
POTASSIUM: 3.9 mmol/L (ref 3.5–5.1)
SODIUM: 137 mmol/L (ref 136–145)

## 2022-03-31 LAB — COVID-19, FLU A/B, RSV RAPID BY PCR
INFLUENZA VIRUS TYPE A: NOT DETECTED
INFLUENZA VIRUS TYPE B: NOT DETECTED
RESPIRATORY SYNCTIAL VIRUS (RSV): NOT DETECTED
SARS-CoV-2: NOT DETECTED

## 2022-03-31 LAB — MAGNESIUM: MAGNESIUM: 2 mg/dL (ref 1.9–2.7)

## 2022-03-31 LAB — PT/INR
INR: 1.05 (ref 0.88–1.10)
PROTHROMBIN TIME: 12.2 seconds (ref 9.8–12.7)

## 2022-03-31 LAB — PTT (PARTIAL THROMBOPLASTIN TIME): APTT: 45.7 seconds — ABNORMAL HIGH (ref 22.0–31.7)

## 2022-03-31 LAB — LIPASE: LIPASE: 50 U/L (ref 11–82)

## 2022-03-31 MED ORDER — SODIUM CHLORIDE 0.9 % INTRAVENOUS PIGGYBACK
1.0000 g | INTRAVENOUS | Status: AC
Start: 2022-03-31 — End: 2022-03-31
  Administered 2022-03-31: 0 g via INTRAVENOUS
  Administered 2022-03-31: 1 g via INTRAVENOUS

## 2022-03-31 MED ORDER — SODIUM CHLORIDE 0.9 % INTRAVENOUS PIGGYBACK
INJECTION | INTRAVENOUS | Status: AC
Start: 2022-03-31 — End: 2022-03-31
  Filled 2022-03-31: qty 50

## 2022-03-31 MED ORDER — ONDANSETRON HCL (PF) 4 MG/2 ML INJECTION SOLUTION
INTRAMUSCULAR | Status: AC
Start: 2022-03-31 — End: 2022-03-31
  Filled 2022-03-31: qty 2

## 2022-03-31 MED ORDER — ONDANSETRON HCL (PF) 4 MG/2 ML INJECTION SOLUTION
4.0000 mg | INTRAMUSCULAR | Status: AC
Start: 2022-03-31 — End: 2022-03-31
  Administered 2022-03-31: 4 mg via INTRAVENOUS

## 2022-03-31 MED ORDER — AZITHROMYCIN 500 MG INTRAVENOUS SOLUTION
INTRAVENOUS | Status: AC
Start: 2022-03-31 — End: 2022-03-31
  Filled 2022-03-31: qty 5

## 2022-03-31 MED ORDER — PROMETHAZINE 25 MG TABLET
ORAL_TABLET | ORAL | Status: AC
Start: 2022-03-31 — End: 2022-03-31
  Filled 2022-03-31: qty 1

## 2022-03-31 MED ORDER — FUROSEMIDE 10 MG/ML INJECTION SOLUTION
60.0000 mg | INTRAMUSCULAR | Status: AC
Start: 2022-03-31 — End: 2022-03-31
  Administered 2022-03-31: 60 mg via INTRAVENOUS

## 2022-03-31 MED ORDER — PROMETHAZINE 25 MG TABLET
25.0000 mg | ORAL_TABLET | ORAL | Status: AC
Start: 2022-03-31 — End: 2022-03-31
  Administered 2022-03-31: 25 mg via ORAL

## 2022-03-31 MED ORDER — SODIUM CHLORIDE 0.9 % INTRAVENOUS SOLUTION
500.0000 mg | INTRAVENOUS | Status: AC
Start: 2022-03-31 — End: 2022-03-31
  Administered 2022-03-31: 0 mg via INTRAVENOUS
  Administered 2022-03-31: 500 mg via INTRAVENOUS

## 2022-03-31 MED ORDER — AZITHROMYCIN 500 MG TABLET
500.0000 mg | ORAL_TABLET | ORAL | 0 refills | Status: AC
Start: 2022-03-31 — End: ?

## 2022-03-31 MED ORDER — FUROSEMIDE 10 MG/ML INJECTION SOLUTION
INTRAMUSCULAR | Status: AC
Start: 2022-03-31 — End: 2022-03-31
  Filled 2022-03-31: qty 10

## 2022-03-31 MED ORDER — CEFTRIAXONE 1 GRAM SOLUTION FOR INJECTION
INTRAMUSCULAR | Status: AC
Start: 2022-03-31 — End: 2022-03-31
  Filled 2022-03-31: qty 10

## 2022-03-31 NOTE — ED Nurses Note (Signed)
Patient given written and verbal d/c instructions. All questions/concerns addressed. Informed to return with any concerns. Patient verbalizes understanding of d/c instructions. Informed of x1 prescriptions at pharmacy. Patient leaving via wheelchair at this time.

## 2022-03-31 NOTE — ED Nurses Note (Signed)
Pt to ED 19 with complaints of shortness of breath that have not been relieved since visit to [facility]. Placed pt on 6L oxy mask to prevent nostril burning with NC. Pt tripoding and grunting with exhale. Pt missed her dialysis appointment today because she was being seen from [place] at [facility].

## 2022-03-31 NOTE — ED Provider Notes (Addendum)
[facility], [facility] Emergency Department  ED Primary Provider Note  History of Present Illness   Chief Complaint   Patient presents with    Shortness of Breath    Abdominal Pain     [name] is a 47 y.o. female who had concerns including Shortness of Breath and Abdominal Pain.  Arrival: The patient arrived by Car complaining feeling short of breath as well as coughing nonproductive over the past 3 days.  Patient states she had a low-grade temperature when she went to dialysis on Friday.  Patient states she does dialysis 4 days a week.  Last dose was Friday.  Patient is due again today at 11:30 a.m.Marland Kitchen  Patient denies any increased fluid in her lower legs.  Patient denies any chest pain at this time.  Patient does have shortness of breath with exertion.  Patient does smoke daily.  Patient is also complaining left and right upper quadrant abdominal pain with dilatation.  Patient says that she has been having nausea vomiting for the past week.  She denies any diarrhea.  Patient denies any black or bloody stools.    HPI  Review of Systems   Review of Systems   Constitutional:  Positive for activity change and appetite change. Negative for chills and fever.   HENT:  Negative for ear pain and sore throat.    Eyes:  Negative for pain and visual disturbance.   Respiratory:  Positive for cough and shortness of breath.    Cardiovascular:  Negative for chest pain and palpitations.   Gastrointestinal:  Positive for abdominal pain, nausea and vomiting.   Genitourinary:  Negative for dysuria and hematuria.   Musculoskeletal:  Negative for arthralgias and back pain.   Skin:  Negative for color change and rash.   Neurological:  Negative for seizures and syncope.   All other systems reviewed and are negative.     Historical Data   History Reviewed This Encounter:     Physical Exam   ED Triage Vitals [04/19/2022 0741]   BP (Non-Invasive) 126/81   Heart Rate 75   Respiratory Rate (!) 22    Temperature 36.3 C (97.3 F)   SpO2 100 %   Weight 81.2 kg (179 lb)   Height 1.702 m (5\' 7" )     Physical Exam  Vitals and nursing note reviewed.   Constitutional:       General: She is not in acute distress.     Appearance: She is well-developed. She is obese.   HENT:      Head: Normocephalic and atraumatic.      Right Ear: External ear normal.      Left Ear: External ear normal.      Nose: Nose normal.      Mouth/Throat:      Mouth: Mucous membranes are dry.   Eyes:      Extraocular Movements: Extraocular movements intact.      Conjunctiva/sclera: Conjunctivae normal.      Pupils: Pupils are equal, round, and reactive to light.   Cardiovascular:      Rate and Rhythm: Normal rate and regular rhythm.      Heart sounds: No murmur heard.  Pulmonary:      Effort: Pulmonary effort is normal. No respiratory distress.      Breath sounds: Rales present.      Comments: Bibasilar rales  Abdominal:      General: Bowel sounds are normal.      Palpations: Abdomen  is soft.      Tenderness: There is abdominal tenderness.      Comments: Positive tenderness over epigastric area.  Patient does have hyperactive bowel sounds throughout the abdomen.  There is no distention in the abdomen.  There is no guarding or rebound.   Musculoskeletal:         General: No swelling. Normal range of motion.      Cervical back: Normal range of motion and neck supple.   Skin:     General: Skin is warm and dry.      Capillary Refill: Capillary refill takes less than 2 seconds.   Neurological:      General: No focal deficit present.      Mental Status: She is alert and oriented to person, place, and time.   Psychiatric:         Mood and Affect: Mood normal.         Behavior: Behavior normal.         Thought Content: Thought content normal.       Patient Data     Labs Ordered/Reviewed   COMPREHENSIVE METABOLIC PANEL, NON-FASTING - Abnormal; Notable for the following components:       Result Value    CHLORIDE 95 (*)     ANION GAP 18 (*)     BUN 80 (*)      CREATININE 14.11 (*)     ESTIMATED GFR 3 (*)     OSMOLALITY, CALCULATED 298 (*)     All other components within normal limits    Narrative:     Estimated Glomerular Filtration Rate (eGFR) is calculated using the CKD-EPI (2021) equation, intended for patients 34 years of age and older. If gender is not documented or "unknown", there will be no eGFR calculation.   PTT (PARTIAL THROMBOPLASTIN TIME) - Abnormal; Notable for the following components:    APTT 45.7 (*)     All other components within normal limits   CBC WITH DIFF - Abnormal; Notable for the following components:    WBC 16.0 (*)     RBC 2.79 (*)     HGB 8.3 (*)     HCT 26.1 (*)     MCHC 31.9 (*)     RDW 21.7 (*)     NEUTROPHIL % 91 (*)     LYMPHOCYTE % 3 (*)     NEUTROPHIL # 14.58 (*)     LYMPHOCYTE # 0.51 (*)     All other components within normal limits   LIPASE - Normal   PT/INR - Normal    Narrative:     INR OF 2.0-3.0  RECOMMENDED FOR: PROPHYLAXIS/TREATMENT OF VENEOUS THROMBOSIS, PULMONARY EMBOLISM, PREVENTION OF SYSTEMIC EMBOLISM FROM ATRIAL FIBRILATION, MYOCARDIAL INFARCTION.    INR OF 2.5-3.5  RECOMMENDED FOR MECHANICAL PROSTHETIC HEART VALVES, RECURRENT SYSTEMIC EMBOLISM, RECURRENT MYOCARDIAL INFARCTION.     COVID-19, FLU A/B, RSV RAPID BY PCR - Normal    Narrative:     Results are for the simultaneous qualitative identification of SARS-CoV-2 (formerly 2019-nCoV), Influenza A, Influenza B, and RSV RNA. These etiologic agents are generally detectable in nasopharyngeal and nasal swabs during the ACUTE PHASE of infection. Hence, this test is intended to be performed on respiratory specimens collected from individuals with signs and symptoms of upper respiratory tract infection who meet Centers for Disease Control and Prevention (CDC) clinical and/or epidemiological criteria for Coronavirus Disease 2019 (COVID-19) testing. CDC COVID-19 criteria for testing on human specimens is available at  CDC's webpage information for Healthcare Professionals:  Coronavirus Disease 2019 (COVID-19) ([employer]).     False-negative results may occur if the virus has genomic mutations, insertions, deletions, or rearrangements or if performed very early in the course of illness. Otherwise, negative results indicate virus specific RNA targets are not detected, however negative results do not preclude SARS-CoV-2 infection/COVID-19, Influenza, or Respiratory syncytial virus infection. Results should not be used as the sole basis for patient management decisions. Negative results must be combined with clinical observations, patient history, and epidemiological information. If upper respiratory tract infection is still suspected based on exposure history together with other clinical findings, re-testing should be considered.    Disclaimer:   This assay has been authorized by FDA under an Emergency Use Authorization for use in laboratories certified under the Clinical Laboratory Improvement Amendments of 1988 (CLIA), 42 U.S.C. [phone], to perform high complexity tests. The impacts of vaccines, antiviral therapeutics, antibiotics, chemotherapeutic or immunosuppressant drugs have not been evaluated.     Test methodology:   Cepheid Xpert Xpress SARS-CoV-2/Flu/RSV Assay real-time polymerase chain reaction (RT-PCR) test on the GeneXpert Dx and Xpert Xpress systems.   CBC/DIFF    Narrative:     The following orders were created for panel order CBC/DIFF.  Procedure                               Abnormality         Status                     ---------                               -----------         ------                     CBC WITH DIFF[[id number]]                Abnormal            Final result                 Please view results for these tests on the individual orders.   URINALYSIS WITH REFLEX MICROSCOPIC AND CULTURE IF POSITIVE    Narrative:     The following orders were created for panel order URINALYSIS WITH REFLEX MICROSCOPIC AND CULTURE  IF POSITIVE.  Procedure                               Abnormality         Status                     ---------                               -----------         ------                     URINALYSIS, NTIRW/ERXVQ[[id number]]  Please view results for these tests on the individual orders.   URINALYSIS, MACRO/MICRO     XR ABD FLAT AND UPRIGHT SERIES (W PA CHEST)   Final Result by Edi, Radresults In (02/05 0849)   Diffuse airspace opacities within the lungs could be due to edema or pneumonia.      Increased stool throughout the colon.            Radiologist location ID: [place] Decision Making        Medical Decision Making  Patient is 47 year old black female who gets dialysis 4 days a week.  Patient presents with shortness of breath and coughing since Friday.  Coughs nonproductive.  She states she has fever and chills though she comes in normal temperature.  She also states she has been having abdominal pain right and left upper quadrants with nausea and vomiting for the past week.  She denies any diarrhea.  Patient is due for dialysis today.  Patient will have an IV placed along with labs and an x-ray of her chest abdomen pelvis.  Depending on what the results tests are will depend on whether the patient gets out here and goes to dialysis.     Amount and/or Complexity of Data Reviewed  Labs: ordered.  Radiology: ordered.    Risk  Prescription drug management.             Medications Administered in the ED   ondansetron (ZOFRAN) 2 mg/mL injection (4 mg Intravenous Given 04/20/2022 0851)   furosemide (LASIX) 10 mg/mL injection (60 mg Intravenous Given 03/28/2022 0851)   cefTRIAXone (ROCEPHIN) 1 g in NS 50 mL IVPB minibag (0 g Intravenous Stopped 04/15/2022 1102)   azithromycin (ZITHROMAX) 500 mg in NS 250 mL IVPB (0 mg Intravenous Stopped 04/03/2022 1233)   ondansetron (ZOFRAN) 2 mg/mL injection (4 mg Intravenous Given 04/13/2022 1132)   promethazine (PHENERGAN)  tablet (25 mg Oral Given 04/06/2022 1239)     Clinical Impression   Pneumonia (Primary)   CKD (chronic kidney disease) stage 5, 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

## 2022-03-31 NOTE — ED Provider Notes (Signed)
[facility]  Emergency Department  Attending Provider Note      CHIEF COMPLAINT  Chief Complaint   Patient presents with    Shortness of Breath     HISTORY OF [name], date of birth Nov 02, 1975, is a 47 y.o. female who presented to the Emergency Department via EMS with shortness breath and chest pressure.  The patient states she did not have dialysis today because she was in the ED [facility] due to shortness of breath.  She states she was treated and released from that facility.  She has dialysis 4 times weekly.  She last dialyzed 3 days ago.    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    Tricuspid valve regurgitation     Uncontrolled hypertension        Past Surgical History:   Procedure Laterality Date    CORONARY ARTERY ANGIOPLASTY      ESOPHAGOGASTRODUODENOSCOPY      HX BACK SURGERY      HX CHOLECYSTECTOMY      HX FOOT SURGERY Right     HX HYSTERECTOMY      HX TONSILLECTOMY         Family Medical History:       Problem Relation (Age of Onset)    Breast Cancer Mother    Coronary Artery Disease Mother    Diabetes type II Mother    Hypertension (High Blood Pressure) Father          Social History     Socioeconomic History    Marital status: Divorced   Tobacco Use    Smoking status: Every Day     Packs/day: 1.00     Years: 10.00     Additional pack years: 0.00     Total pack years: 10.00     Types: Cigarettes     Passive exposure: Past    Smokeless tobacco: Former    Tobacco comments:     "Haven't had a cigerette in 2  weeks."   Vaping Use    Vaping Use: Never used   Substance and Sexual Activity    Alcohol use: Not Currently    Drug use: Yes     Types: Marijuana    Sexual activity: Not Currently     Social Determinants of Health     Social Connections: Medium Risk (01/30/2022)    Social Connections     SDOH Social Isolation: 1 or 2 times a week      ALLERGIES  Allergies   Allergen Reactions    Lisinopril Swelling     Tongue and throat swelling    Cardene [Nicardipine]  Other Adverse Reaction (Add comment)     Chest pain     Oxycodone Itching       PHYSICAL EXAM  VITAL SIGNS:  Filed Vitals:    04/17/2022 2230 04/22/2022 2300 2022/04/05 0015  Apr 27, 2022 0045   BP: (!) 150/98 135/86 (!) 134/92 (!) 151/97   Pulse:       Resp:       Temp:       SpO2: 95% 94% 95% 93%     GENERAL: PATIENT IS ALERT AND ORIENTED TO PERSON, PLACE, AND TIME.  HEAD: NORMOCEPHALIC AND ATRAUMATIC.  EYES: PUPILS EQUALLY ROUND AND REACT TO LIGHT. EXTRAOCULAR MOVEMENTS INTACT.  EARS: GROSS HEARING INTACT. EXTERNAL EARS WITHIN NORMAL LIMITS.  NOSE: NO SEPTAL DEVIATION. NASAL PASSAGES CLEAR.  THROAT: MOIST ORAL MUCOSA. NO ERYTHEMA OR EXUDATE OF THE PHARYNX.  NECK: SUPPLE. TRACHEA MIDLINE.  CARDIOVASCULAR: REGULAR RATE, AND RHYTHM. NO MURMUR.  LUNGS:  Diminished throughout, crackles in bases.  ABDOMEN: SOFT, TENDER throughout, NON-DISTENDED, AND BOWEL SOUNDS ARE PRESENT.  GENITOURINARY: DEFERRED.  RECTAL: DEFERRED.  EXTREMITIES: NO CYANOSIS, CLUBBING, OR EDEMA.  SKIN: WARM AND DRY.  NEUROLOGIC: CRANIAL NERVES II THROUGH XII ARE GROSSLY INTACT. MOVES ALL 4 EXTREMITIES.  PSYCHIATRIC: JUDGMENT AND INSIGHT ARE SEEMINGLY INTACT. MOOD AND AFFECT ARE APPROPRIATE FOR THE SITUATION.    DIAGNOSTICS  Labs:  Labs listed below were reviewed and interpreted by me.  Results for orders placed or performed during the hospital encounter of 70/62/37   BASIC METABOLIC PANEL   Result Value Ref Range    SODIUM 137 136 - 145 mmol/L    POTASSIUM 3.9 3.5 - 5.1 mmol/L    CHLORIDE 94 (L) 98 - 107  mmol/L    CO2 TOTAL 22 21 - 31 mmol/L    ANION GAP 21 (H) 4 - 13 mmol/L    CALCIUM 9.5 8.6 - 10.3 mg/dL    GLUCOSE 87 74 - 109 mg/dL    BUN 78 (H) 7 - 25 mg/dL    CREATININE 13.28 (H) 0.60 - 1.30 mg/dL    BUN/CREA RATIO 6 6 - 22    ESTIMATED GFR 3 (L) >59 mL/min/1.62m^2    OSMOLALITY, CALCULATED 297 (H) 270 - 290 mOsm/kg   MAGNESIUM   Result Value Ref Range    MAGNESIUM 2.0 1.9 - 2.7 mg/dL   TROPONIN-I   Result Value Ref Range    TROPONIN I 91 (H) <15 ng/L   CBC WITH DIFF   Result Value Ref Range    WBC 14.2 (H) 3.8 - 11.8 x10^3/uL    RBC 2.52 (L) 3.63 - 4.92 x10^6/uL    HGB 7.2 (L) 10.9 - 14.3 g/dL    HCT 22.2 (L) 31.2 - 41.9 %    MCV 88.0 75.5 - 95.3 fL    MCH 28.6 24.7 - 32.8 pg    MCHC 32.5 32.3 - 35.6 g/dL    RDW 21.5 (H) 12.3 - 17.7 %    PLATELETS 242 140 - 440 x10^3/uL    MPV 7.6 (L) 7.9 - 10.8 fL    NEUTROPHIL % 85 (H) 43 - 77 %    LYMPHOCYTE % 8 (L) 16 - 44 %    MONOCYTE % 6 5 - 13 %    EOSINOPHIL % 0 (L) %    BASOPHIL % 1 0 - 1 %    NEUTROPHIL # 12.00 (H) 1.85 - 7.80 x10^3/uL    LYMPHOCYTE # 1.20 1.00 - 3.00 x10^3/uL    MONOCYTE # 0.90 0.30 - 1.00 x10^3/uL    EOSINOPHIL # 0.00 0.00 - 0.50 x10^3/uL    BASOPHIL # 0.10 0.00 - 0.10 x10^3/uL   ECG 12 LEAD   Result Value Ref Range    Ventricular rate 73  BPM    Atrial Rate 73 BPM    PR Interval 230 ms    QRS Duration 124 ms    QT Interval 472 ms    QTC Calculation 519 ms    Calculated P Axis 75 degrees    Calculated R Axis -25 degrees    Calculated T Axis 74 degrees     Radiology:  Results for orders placed or performed during the hospital encounter of 04/18/2022   XR AP MOBILE CHEST     Status: None    Narrative    Timmi Saavedra    RADIOLOGIST: Dorisann Frames, MD    XR AP MOBILE CHEST performed on 04/13/2022 11:34 PM    CLINICAL HISTORY: chest pain, dyspnea.  dyspnea, chest pain    TECHNIQUE: Frontal view of the chest 2325 hours.    COMPARISON:  0832 hours today    FINDINGS:  Right IJ dialysis catheter and SVC stent are noted.   Heart size is mildly enlarged.      Symmetric pulmonary opacities are again noted throughout both lungs with a perihilar and basilar predominance, mildly worsened at the bases since this morning.   Lung apices are relatively spared.  Small pleural effusions blunt the costophrenic angles.      Impression    PULMONARY OPACITIES MOST SUGGESTIVE OF CHF AND/OR FLUID OVERLOAD WITH MILD WORSENING SINCE THIS MORNING. PNEUMONIA REMAINS IN THE DIFFERENTIAL DIAGNOSIS.        Radiologist location ID: [id number]         ED COURSE/MEDICAL DECISION MAKING  Medications Administered in the ED          Medical Decision Making  This patient presented to the ED with shortness of breath and chest pain.  She was seen in the ED at [facility] this am and diagnosed with pneumonia and constipation.  She states her symptoms have worsened.  EMS placed her on 6L of oxygen by oxymask en route to the ED.  Xrays and labs were repeated.  She has not had dialysis x 3 days.  She is fluid overloaded.  Hemoglobin has decreased from 9.9 to 7.2 in 8 days.  She denies bleeding.  The case was discussed with the nocturnist and she will be admitted.          CLINICAL IMPRESSION  Clinical Impression   Hypervolemia, unspecified hypervolemia type (Primary)   Constipation, unspecified constipation type     DISPOSITION  Admitted       DISCHARGE MEDICATIONS  Current Discharge Medication List          Quita Skye Sabra Heck D.O.   04/18/2022, 22:57   [facility]  Department of Emergency Medicine  [facility]    Contents of the document, in whole or in part, are completed utilizing M*Modal dictation technology, please forgive any typographical errors that may exist.   -----

## 2022-03-31 NOTE — ED Nurses Note (Signed)
Patient reports increased shortness of breath, productive cough x4 days with clar sputum. Reports LUQ pain, denies any problems with bowel movements. Patient reports Dialysis Monday, Wednesday Thursday, and Friday. Has Appt 11:40 today. Tender to LUQ, reports decreased appetite d/t pain. Chronic 4 lpm NC use. 96% 02. Denies any fever/chills.

## 2022-03-31 NOTE — ED Nurses Note (Signed)
Patient denise any needs at present, plan of care ongoing. Awaiting IV antibiotics to finish prior to d/c. Patient reports increased abdominal cramping and nausea, provider informed. Medications given.

## 2022-03-31 NOTE — ED Nurses Note (Signed)
Patient denies any needs at present, plan of care ongoing. Patient reports abdominal discomfort and nausea at present.

## 2022-03-31 NOTE — ED Triage Notes (Signed)
Patient with trouble breathing for last week. Felt worse this am and called EMS they gave her a breathing treatment and she felt better. Then about 30 minutes after they left she felt very tight and short of breath again. Patient also has complaints of abdominal pain upper since yesterday with cramping and states it seems swollen to her. Worst abdominal pain is over the left upper quadrant.  Patient has increased dry mouth and complaints of feeling very hot from abdomen to upper thighs. Trouble hearing in her left ear. She has not missed any dialysis treatments.

## 2022-03-31 NOTE — ED Triage Notes (Signed)
DISCHARGED FROM [facility] THIS AM WITH DX OF PNEUMONIA, INCREASED TO 6 LITERS OF OXYGEN FROM HER NORMAL 4. PATIENT COMPLAINS OF INCREASE IN SOB AND CHEST PRESSURE. HAS NOT BEEN TO DIALYSIS SINCE FRIDAY.  EMS - [facility] HHNTX

## 2022-03-31 NOTE — ED Nurses Note (Signed)
VSS, patient denies any needs at present. IV antibiotics continue infusing. Awaiting d/c home.

## 2022-03-31 NOTE — ED Nurses Note (Signed)
[facility], [facility] Emergency Department  Peer Recovery Coach Assessment    Initial Evaluation  Referred by:: Nurse  Location of Evaluation: Emergency Department  How many times in the last 12 months have you been to the ED?: 6 or more  Have you ever served or are you currently serving in the [employer]?: No             Substance Use History  Patient current substance use status: Patinet has several health problems and patient smokes marijuana occasionally for pain management. Patient states that she has not smoked recently because you cannot trust people.    Prior treatment history?: No    Currently enrolled in substance use program?: No    Within the last 30 days, what substances has the patient used?: Marijuana  Patient's age at first substance use?: 15-20  Drug route of administration: Smoked    Has the patient ever had sustained abstinence?: Yes              Family, Social, Home & Safety History  Marital Status: Single            Need to improve relationships with family?: No    Social network: Immediate family, Non-substance using peers/friends/other, Substance using peers    Current living situation: Independent  Any help needed with the following?: None  Contact phone number for the patient: [phone]       Has the patient had any legal issues within the past 30 days?: None         Employment  Current employment status: Disabled    Landscape architect?: No  Needs assistance with job search?: No    Engagement  Readiness ruler: 5  Summary of assessment priority areas: Substance abuse treatment  Summary of assessment priority areas: comments: Patient states that she has not been smoking marijuana due to not trusting people and people adding stuff to the marijuana.    Brief Intervention  Discussed plan to reduce/quit substance use?: Yes  Discussed willingness to enter treatment?: Yes  Indicated patient's stage of change:: 2 - Contemplation    Patient seen by Peer  Recovery Coach and is a candidate for buprenorphine administration in the ED. Patient needs assessment for bup treatment.: No    Plan  Was the patient referred to treatment?: No    Was patient referred to physician for Buprenorphine Assessment in the ED?: No    Did patient receive Narcan in the ED?: No         Follow-up  Patient admitted for treatment?: No        Need for additional follow-up?: No  Additional comments: Patinet has several health problems and patient smokes marijuana occasionally for pain management. Patient states that she has not smoked recently because you cannot trust people. Discussed harm reduction and medical marijuana card.    Callie Fielding, Peer Recovery Coach 04/18/2022 09:44

## 2022-04-01 ENCOUNTER — Emergency Department (HOSPITAL_COMMUNITY): Payer: Medicare Other

## 2022-04-01 DIAGNOSIS — I517 Cardiomegaly: Secondary | ICD-10-CM

## 2022-04-01 DIAGNOSIS — I440 Atrioventricular block, first degree: Secondary | ICD-10-CM

## 2022-04-01 DIAGNOSIS — R918 Other nonspecific abnormal finding of lung field: Secondary | ICD-10-CM

## 2022-04-01 DIAGNOSIS — F17210 Nicotine dependence, cigarettes, uncomplicated: Secondary | ICD-10-CM

## 2022-04-01 DIAGNOSIS — D649 Anemia, unspecified: Secondary | ICD-10-CM

## 2022-04-01 DIAGNOSIS — R001 Bradycardia, unspecified: Secondary | ICD-10-CM

## 2022-04-01 DIAGNOSIS — R9431 Abnormal electrocardiogram [ECG] [EKG]: Secondary | ICD-10-CM

## 2022-04-01 LAB — CBC WITH DIFF
BASOPHIL #: 0.1 10*3/uL (ref 0.00–0.10)
BASOPHIL %: 1 % (ref 0–1)
EOSINOPHIL #: 0 10*3/uL (ref 0.00–0.50)
EOSINOPHIL %: 0 % — ABNORMAL LOW
HCT: 22.2 % — ABNORMAL LOW (ref 31.2–41.9)
HGB: 7.2 g/dL — ABNORMAL LOW (ref 10.9–14.3)
LYMPHOCYTE #: 1.2 10*3/uL (ref 1.00–3.00)
LYMPHOCYTE %: 8 % — ABNORMAL LOW (ref 16–44)
MCH: 28.6 pg (ref 24.7–32.8)
MCHC: 32.5 g/dL (ref 32.3–35.6)
MCV: 88 fL (ref 75.5–95.3)
MONOCYTE #: 0.9 10*3/uL (ref 0.30–1.00)
MONOCYTE %: 6 % (ref 5–13)
MPV: 7.6 fL — ABNORMAL LOW (ref 7.9–10.8)
NEUTROPHIL #: 12 10*3/uL — ABNORMAL HIGH (ref 1.85–7.80)
NEUTROPHIL %: 85 % — ABNORMAL HIGH (ref 43–77)
PLATELETS: 242 10*3/uL (ref 140–440)
RBC: 2.52 10*6/uL — ABNORMAL LOW (ref 3.63–4.92)
RDW: 21.5 % — ABNORMAL HIGH (ref 12.3–17.7)
WBC: 14.2 10*3/uL — ABNORMAL HIGH (ref 3.8–11.8)

## 2022-04-01 LAB — ECG 12 LEAD
Atrial Rate: 73 {beats}/min
Calculated P Axis: 75 degrees
Calculated R Axis: -25 degrees
Calculated T Axis: 74 degrees
PR Interval: 230 ms
QRS Duration: 124 ms
QT Interval: 472 ms
QTC Calculation: 519 ms
Ventricular rate: 73 {beats}/min

## 2022-04-01 LAB — TROPONIN-I: TROPONIN I: 91 ng/L — ABNORMAL HIGH (ref <15)

## 2022-04-01 LAB — GRAY TOP TUBE

## 2022-04-01 LAB — BLUE TOP TUBE

## 2022-04-01 MED ORDER — ACETAMINOPHEN 1,000 MG/100 ML (10 MG/ML) INTRAVENOUS SOLUTION
1000.0000 mg | Freq: Four times a day (QID) | INTRAVENOUS | Status: DC | PRN
Start: 2022-04-01 — End: 2022-04-01
  Filled 2022-04-01: qty 100

## 2022-04-01 MED ORDER — BUSPIRONE 5 MG TABLET
ORAL_TABLET | ORAL | Status: AC
Start: 2022-04-01 — End: 2022-04-01
  Filled 2022-04-01: qty 2

## 2022-04-01 MED ORDER — DICYCLOMINE 10 MG CAPSULE
10.0000 mg | ORAL_CAPSULE | Freq: Three times a day (TID) | ORAL | Status: DC
Start: 2022-04-01 — End: 2022-04-01
  Administered 2022-04-01: 10 mg via ORAL

## 2022-04-01 MED ORDER — LABETALOL 200 MG TABLET
800.0000 mg | ORAL_TABLET | Freq: Three times a day (TID) | ORAL | Status: DC
Start: 2022-04-01 — End: 2022-04-01
  Administered 2022-04-01: 800 mg via ORAL
  Filled 2022-04-01 (×3): qty 4

## 2022-04-01 MED ORDER — SUCRALFATE 1 GRAM TABLET
1.0000 g | ORAL_TABLET | Freq: Four times a day (QID) | ORAL | Status: DC
Start: 2022-04-01 — End: 2022-04-01
  Administered 2022-04-01 (×2): 1 g via ORAL
  Filled 2022-04-01: qty 1

## 2022-04-01 MED ORDER — CLONIDINE HCL 0.1 MG TABLET
ORAL_TABLET | ORAL | Status: AC
Start: 2022-04-01 — End: 2022-04-01
  Filled 2022-04-01: qty 1

## 2022-04-01 MED ORDER — ONDANSETRON 4 MG DISINTEGRATING TABLET
4.0000 mg | ORAL_TABLET | Freq: Three times a day (TID) | ORAL | Status: DC | PRN
Start: 2022-04-01 — End: 2022-04-01

## 2022-04-01 MED ORDER — AMLODIPINE 5 MG TABLET
10.0000 mg | ORAL_TABLET | Freq: Every day | ORAL | Status: DC
Start: 2022-04-01 — End: 2022-04-01

## 2022-04-01 MED ORDER — SODIUM CHLORIDE 0.9 % INTRAVENOUS PIGGYBACK
1.0000 g | INTRAVENOUS | Status: DC
Start: 2022-04-01 — End: 2022-04-01

## 2022-04-01 MED ORDER — BUSPIRONE 5 MG TABLET
10.0000 mg | ORAL_TABLET | Freq: Three times a day (TID) | ORAL | Status: DC
Start: 2022-04-01 — End: 2022-04-01
  Administered 2022-04-01: 10 mg via ORAL

## 2022-04-01 MED ORDER — FAMOTIDINE 20 MG TABLET
ORAL_TABLET | ORAL | Status: AC
Start: 2022-04-01 — End: 2022-04-01
  Filled 2022-04-01: qty 1

## 2022-04-01 MED ORDER — ATORVASTATIN 40 MG TABLET
ORAL_TABLET | ORAL | Status: AC
Start: 2022-04-01 — End: 2022-04-01
  Filled 2022-04-01: qty 2

## 2022-04-01 MED ORDER — DOXAZOSIN 4 MG TABLET
4.0000 mg | ORAL_TABLET | Freq: Every evening | ORAL | Status: DC
Start: 2022-04-01 — End: 2022-04-01
  Administered 2022-04-01: 4 mg via ORAL
  Filled 2022-04-01: qty 1

## 2022-04-01 MED ORDER — ATORVASTATIN 40 MG TABLET
80.0000 mg | ORAL_TABLET | Freq: Every evening | ORAL | Status: DC
Start: 2022-04-01 — End: 2022-04-01
  Administered 2022-04-01: 80 mg via ORAL

## 2022-04-01 MED ORDER — SODIUM CHLORIDE 0.9 % INTRAVENOUS PIGGYBACK
100.0000 mg | Freq: Two times a day (BID) | INTRAVENOUS | Status: DC
Start: 2022-04-01 — End: 2022-04-01
  Administered 2022-04-01: 100 mg via INTRAVENOUS
  Administered 2022-04-01: 0 mg via INTRAVENOUS
  Filled 2022-04-01: qty 10

## 2022-04-01 MED ORDER — IPRATROPIUM 0.5 MG-ALBUTEROL 3 MG (2.5 MG BASE)/3 ML NEBULIZATION SOLN
3.0000 mL | INHALATION_SOLUTION | Freq: Four times a day (QID) | RESPIRATORY_TRACT | Status: DC
Start: 2022-04-01 — End: 2022-04-01
  Administered 2022-04-01: 3 mL via RESPIRATORY_TRACT
  Administered 2022-04-01: 0 mL via RESPIRATORY_TRACT

## 2022-04-01 MED ORDER — PANTOPRAZOLE 40 MG TABLET,DELAYED RELEASE
40.0000 mg | DELAYED_RELEASE_TABLET | Freq: Every day | ORAL | Status: DC
Start: 2022-04-01 — End: 2022-04-01
  Administered 2022-04-01: 40 mg via ORAL
  Filled 2022-04-01: qty 1

## 2022-04-01 MED ORDER — CLONIDINE HCL 0.1 MG TABLET
0.2000 mg | ORAL_TABLET | Freq: Two times a day (BID) | ORAL | Status: DC
Start: 2022-04-01 — End: 2022-04-01
  Administered 2022-04-01: 0.2 mg via ORAL

## 2022-04-01 MED ORDER — RANOLAZINE ER 500 MG TABLET,EXTENDED RELEASE,12 HR
500.0000 mg | ORAL_TABLET | Freq: Two times a day (BID) | ORAL | Status: DC
Start: 2022-04-01 — End: 2022-04-01
  Filled 2022-04-01: qty 1

## 2022-04-01 MED ORDER — RAMELTEON 8 MG TABLET
8.0000 mg | ORAL_TABLET | Freq: Every evening | ORAL | Status: DC
Start: 2022-04-01 — End: 2022-04-01

## 2022-04-01 MED ORDER — ACETAMINOPHEN 325 MG TABLET
650.0000 mg | ORAL_TABLET | ORAL | Status: DC | PRN
Start: 2022-04-01 — End: 2022-04-01

## 2022-04-01 MED ORDER — SUCRALFATE 1 GRAM TABLET
ORAL_TABLET | ORAL | Status: AC
Start: 2022-04-01 — End: 2022-04-01
  Filled 2022-04-01: qty 1

## 2022-04-01 MED ORDER — DICYCLOMINE 10 MG CAPSULE
ORAL_CAPSULE | ORAL | Status: AC
Start: 2022-04-01 — End: 2022-04-01
  Filled 2022-04-01: qty 1

## 2022-04-01 MED ORDER — FAMOTIDINE 20 MG TABLET
20.0000 mg | ORAL_TABLET | Freq: Two times a day (BID) | ORAL | Status: DC
Start: 2022-04-01 — End: 2022-04-01
  Administered 2022-04-01: 20 mg via ORAL

## 2022-04-01 MED ORDER — ASPIRIN 81 MG CHEWABLE TABLET
81.0000 mg | CHEWABLE_TABLET | Freq: Every day | ORAL | Status: DC
Start: 2022-04-01 — End: 2022-04-01

## 2022-04-02 LAB — URINE CULTURE,ROUTINE: URINE CULTURE: >100000 — AB

## 2022-04-06 LAB — ADULT ROUTINE BLOOD CULTURE, SET OF 2 BOTTLES (BACTERIA AND YEAST)
BLOOD CULTURE, ROUTINE: NO GROWTH
BLOOD CULTURE, ROUTINE: NO GROWTH

## 2022-04-25 NOTE — Care Plan (Signed)
Problem: Adult Inpatient Plan of Care  Goal: Plan of Care Review  Outcome: Outcome Achieved  Goal: Patient-Specific Goal (Individualized)  Outcome: Outcome Achieved  Flowsheets (Taken 04-10-22 0300)  Individualized Care Needs: monitor vitals, labs  Anxieties, Fears or Concerns: brilinta, soa  Patient-Specific Goals (Include Timeframe): dialysis tomorrow  Goal: Absence of Hospital-Acquired Illness or Injury  Outcome: Outcome Achieved  Goal: Optimal Comfort and Wellbeing  Outcome: Outcome Achieved  Goal: Rounds/Family Conference  Outcome: Outcome Achieved     Problem: Pain Acute  Goal: Optimal Pain Control and Function  Outcome: Outcome Achieved     Problem: Fall Injury Risk  Goal: Absence of Fall and Fall-Related Injury  Outcome: Ongoing (see interventions/notes)     Problem: Gas Exchange Impaired  Goal: Optimal Gas Exchange  Outcome: Ongoing (see interventions/notes)

## 2022-04-25 NOTE — ED Nurses Note (Signed)
Pt's BRILINTA is being held pending stool occult.

## 2022-04-25 NOTE — ED Nurses Note (Signed)
This nurse and PCT Hollie took pt upstairs to 4th floor. Nurses and techs on that floor have been notified and this nurse and Hollie, PCT, placed pt in bed and on own oxygen tank. Pt able to assist by sliding onto bed. AAOx3.

## 2022-04-25 NOTE — ED Nurses Note (Signed)
Report given to Emily RN

## 2022-04-25 NOTE — H&P (Signed)
[facility]  Admission H&P    Date of Service: 04/18/2022   [name], 47 y.o. female  Encounter Start Date:  04/11/2022  Inpatient Admission Date: 04/27/22  Date of Birth:  1975-05-09  PCP: Cam Hai, FNP      Information Obtained from: patient  Chief Complaint:  shortness of breath    HPI: [name] is a 47 y.o., 68 American female who presents with shortness of breath. She is well known to out service from previous admissions. She was seen at [facility] ED yesterday and diagnosed with pneumonia, which made her miss dialysis. She became more and more short of breath so came here tonight. She complains of fever, chest pain, abdominal pain, and body aches.     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    Tricuspid valve regurgitation     Uncontrolled hypertension            Medications Prior to Admission       Prescriptions    amLODIPine (NORVASC) 10 mg Oral Tablet    Take 1 Tablet (10 mg total) by mouth Once a day Take one tablet every day    aspirin (ECOTRIN) 81 mg Oral Tablet, Delayed Release (E.C.)    Take 1 Tablet (81 mg total) by mouth Once a day    azithromycin (ZITHROMAX) 500 mg Oral Tablet    Take 1 Tablet (500 mg total) by mouth Every 24 hours    busPIRone (BUSPAR) 10 mg Oral Tablet    Take 1 Tablet (10 mg total) by mouth Three times a day    cloNIDine HCL (CATAPRES) 0.2 mg Oral Tablet    Take 1 Tablet (0.2 mg total) by mouth Twice daily Take one tablet twice a day    dicyclomine (BENTYL) 10 mg Oral Capsule    Take 1 Capsule (10 mg total)  by mouth Three times a day    doxazosin (CARDURA) 4 mg Oral Tablet    Take 1 Tablet (4 mg total) by mouth Every evening    doxycycline monohydrate (MONODOX) 100 mg Oral Capsule    Take 1 Capsule (100 mg total) by mouth Twice daily    famotidine (PEPCID) 20 mg Oral Tablet    Take 1 Tablet (20 mg total) by mouth Twice daily    labetaloL (NORMODYNE) 200 mg Oral Tablet    Take 4 Tablets (800 mg total) by mouth Three times a day    nitroGLYCERIN (NITROSTAT) 0.4 mg Sublingual Tablet, Sublingual    Place 1 Tablet (0.4 mg total) under the tongue Every 5 minutes as needed for Chest pain for up to 3 doses for 3 doses over 15 minutes    omeprazole (PRILOSEC) 40 mg Oral Capsule, Delayed Release(E.C.)    Take 1 Capsule (40 mg total) by mouth Once a day    ondansetron (ZOFRAN ODT) 4 mg Oral Tablet, Rapid  Dissolve    Take 1 Tablet (4 mg total) by mouth Every 8 hours as needed for Nausea/Vomiting    predniSONE (DELTASONE) 20 mg Oral Tablet    Take 1 Tablet (20 mg total) by mouth Twice daily for 5 days    ranolazine (RANEXA) 500 mg Oral Tablet Sustained Release 12 hr    Take 1 Tablet (500 mg total) by mouth Twice daily for 30 days    rosuvastatin (CRESTOR) 40 mg Oral Tablet    Take 1 Tablet (40 mg total) by mouth Every evening    sucralfate (CARAFATE) 1 gram Oral Tablet    Take 1 Tablet (1 g total) by mouth Every 6 hours    ticagrelor (BRILINTA) 90 mg Oral Tablet    Take 1 Tablet (90 mg total) by mouth Twice daily          Allergies   Allergen Reactions    Lisinopril Swelling     Tongue and throat swelling    Cardene [Nicardipine]  Other Adverse Reaction (Add comment)     Chest pain     Oxycodone Itching     Past Surgical History:   Procedure Laterality Date    CORONARY ARTERY ANGIOPLASTY      ESOPHAGOGASTRODUODENOSCOPY      HX BACK SURGERY      HX CHOLECYSTECTOMY      HX FOOT SURGERY Right     HX HYSTERECTOMY      HX TONSILLECTOMY              Family History:  Family Medical History:       Problem Relation (Age of Onset)    Breast  Cancer Mother    Coronary Artery Disease Mother    Diabetes type II Mother    Hypertension (High Blood Pressure) Father               Social History:  Social History     Tobacco Use    Smoking status: Every Day     Packs/day: 1.00     Years: 10.00     Additional pack years: 0.00     Total pack years: 10.00     Types: Cigarettes     Passive exposure: Past    Smokeless tobacco: Former    Tobacco comments:     "Haven't had a cigerette in 2 weeks."   Vaping Use    Vaping Use: Never used   Substance Use Topics    Alcohol use: Not Currently    Drug use: Yes     Types: Marijuana        Review of Systems:  Review of Systems   Constitutional: Positive for malaise/fatigue.   Gastrointestinal:  Positive for constipation (though she reports normal BM yesterday).   All other systems reviewed and are negative.       Examination:  Temperature: 36.2 C (97.1 F) Heart Rate: 80 BP (Non-Invasive): (!) 151/97   Respiratory Rate: (!) 24 SpO2: 93 %     Physical Exam  Constitutional:       Appearance: She is ill-appearing.   HENT:      Head: Normocephalic and atraumatic.      Right Ear: External ear normal.      Left Ear: External ear normal.      Nose: Nose normal.      Mouth/Throat:      Mouth: Mucous membranes are dry.      Pharynx: Oropharynx is clear.   Eyes:  Extraocular Movements: Extraocular movements intact.      Conjunctiva/sclera: Conjunctivae normal.      Pupils: Pupils are equal, round, and reactive to light.   Cardiovascular:      Rate and Rhythm: Normal rate and regular rhythm.      Pulses: Normal pulses.      Heart sounds: Normal heart sounds.   Pulmonary:      Comments: Bilateral basilar crackles  Abdominal:      General: Bowel sounds are normal.      Palpations: Abdomen is soft.   Musculoskeletal:         General: Normal range of motion.      Cervical back: Normal range of motion and neck supple.   Skin:     General: Skin is warm and dry.      Capillary Refill: Capillary refill takes less than 2 seconds.    Neurological:      General: No focal deficit present.      Mental Status: She is alert and oriented to person, place, and time. Mental status is at baseline.   Psychiatric:         Mood and Affect: Mood normal.         Behavior: Behavior normal.          Labs:    Lab Results Today:    Results for orders placed or performed during the hospital encounter of 04/18/2022 (from the past 24 hour(s))   BASIC METABOLIC PANEL   Result Value Ref Range    SODIUM 137 136 - 145 mmol/L    POTASSIUM 3.9 3.5 - 5.1 mmol/L    CHLORIDE 94 (L) 98 - 107 mmol/L    CO2 TOTAL 22 21 - 31 mmol/L    ANION GAP 21 (H) 4 - 13 mmol/L    CALCIUM 9.5 8.6 - 10.3 mg/dL    GLUCOSE 87 74 - 109 mg/dL    BUN 78 (H) 7 - 25 mg/dL    CREATININE 13.28 (H) 0.60 - 1.30 mg/dL    BUN/CREA RATIO 6 6 - 22    ESTIMATED GFR 3 (L) >59 mL/min/1.3m^2    OSMOLALITY, CALCULATED 297 (H) 270 - 290 mOsm/kg   MAGNESIUM   Result Value Ref Range    MAGNESIUM 2.0 1.9 - 2.7 mg/dL   ECG 12 LEAD   Result Value Ref Range    Ventricular rate 73 BPM    Atrial Rate 73 BPM    PR Interval 230 ms    QRS Duration 124 ms    QT Interval 472 ms    QTC Calculation 519 ms    Calculated P Axis 75 degrees    Calculated R Axis -25 degrees    Calculated T Axis 74 degrees   TROPONIN-I   Result Value Ref Range    TROPONIN I 91 (H) <15 ng/L   CBC WITH DIFF   Result Value Ref Range    WBC 14.2 (H) 3.8 - 11.8 x10^3/uL    RBC 2.52 (L) 3.63 - 4.92 x10^6/uL    HGB 7.2 (L) 10.9 - 14.3 g/dL    HCT 22.2 (L) 31.2 - 41.9 %    MCV 88.0 75.5 - 95.3 fL    MCH 28.6 24.7 - 32.8 pg    MCHC 32.5 32.3 - 35.6 g/dL    RDW 21.5 (H) 12.3 - 17.7 %    PLATELETS 242 140 - 440 x10^3/uL    MPV 7.6 (L) 7.9 - 10.8 fL  NEUTROPHIL % 85 (H) 43 - 77 %    LYMPHOCYTE % 8 (L) 16 - 44 %    MONOCYTE % 6 5 - 13 %    EOSINOPHIL % 0 (L) %    BASOPHIL % 1 0 - 1 %    NEUTROPHIL # 12.00 (H) 1.85 - 7.80 x10^3/uL    LYMPHOCYTE # 1.20 1.00 - 3.00 x10^3/uL    MONOCYTE # 0.90 0.30 - 1.00 x10^3/uL    EOSINOPHIL # 0.00 0.00 - 0.50 x10^3/uL     BASOPHIL # 0.10 0.00 - 0.10 x10^3/uL       Imaging Studies:  No results found.    DNR Status:  Prior    Assessment/Plan:   Active Hospital Problems    Diagnosis    Primary Problem: Fluid overload    Anemia     1) Fluid overload  Consult Dr. Darius Bump for hemodialysis today  2)Pulmonary infiltrates  Viral panel negative yesterday, will recheck  Rocephin and Doxyxycline  3)Anemia, worse than baseline  Heme test stools, transfuse with dialysis  DVT/PE Prophylaxis: SCDs    Wilburn Mylar, DO

## 2022-04-25 NOTE — Progress Notes (Addendum)
[facility] Call Note  Rapid response called on patient at 6:10 a.m..  When I got to the floor at 6:11 a.m. code had been called and was in progress.  Patient was on floor beside bed.  Dr. Elvina Sidle present in doing chest compressions.  When I entered room Dr. Elvina Sidle left and chest compressions were resumed by staff.  Epinephrine given and I intubated the patient in the floor using GlideScope with 7.5 ET tube.  Had good color change and equal breath sounds bilaterally.  Patient received multiple rounds of epinephrine along with IV magnesium, sodium bicarbonate, and calcium gluconate.  Blood sugar was 137.  Patient never had return of pulse or rhythm.  Please see code sheet/nurses documentation for times. Code was performed continuously for 20 minutes with no spontaneous return of circulation or respirations.  Code was called at 6:30 a.m..  Time of death 6:30 a.m.    [facility] Death Certificate Case Number [id number] [place], DO

## 2022-04-25 NOTE — Nurses Notes (Signed)
0525 pt requesting diluadid or fentanyl and "something to make me sleep" Provider notified.  71 New order noted for ofirmev. Informed patient of order but stated this will not work for her. Pt angry at this time and stated she will consider it.  0600  Offered ofirmev again, pt refused and stated that she cannot wait until another doctor gets here.  0608 Charge nurse noted pt brady down on telemetry monitor at nursing desk. At same time received call from telemetry to notify, nurse aide called for help stating patient reporting shortness of breath.   0609: Nurse aide and fellow RN first back to patient to find patient lying in floor beside bed. Charge had initiated rapid response in regards to bradycardia. Upon assessment, no pulse palpated and code called.  6945 CPR started at bedside on floor. ACLS protocol activated with pulse check as appropriate with compressions continued as indicated.  0612 epi  0388 epi  8280 Gwendolyn, sister, notified with updates at this time. Wants all measures continued.  0349 epi, patient moved to bed at this time using backboard for lift assist.  0618 rhythm check - asystole. Currently intubating  0620 epi  0622 bicarb  0624 calcium gluconate  0625 epi. Blood glucose check 137  0627 epi  0628 mag  0629 epi  0630 asystole. Code ended. time of death 0630 pronounced by Vern Claude

## 2022-04-25 NOTE — Discharge Summary (Signed)
[facility]  DISCHARGE SUMMARY    PATIENT NAME:  [name], [name]  MRN:  [id number]  DOB:  [birth year]    ENCOUNTER DATE:  04/17/2022  INPATIENT ADMISSION DATE: 2022-04-26  DISCHARGE DATE:  04/26/2022    ATTENDING PHYSICIAN: Phoebe Perch, MD  SERVICE: PRN HOSPITALIST 5  PRIMARY CARE PHYSICIAN: Cam Hai, FNP       No lay caregiver identified.    PRIMARY DISCHARGE DIAGNOSIS: Fluid overload  Active Hospital Problems    Diagnosis Date Noted    Principal Problem: Fluid overload [E87.70] 08/11/2021    Anemia [D64.9] 08/05/2021      Resolved Hospital Problems   No resolved problems to display.     Active Non-Hospital Problems    Diagnosis Date Noted    Pulmonary edema 01/31/2022    Chronic kidney disease with end stage renal failure on dialysis (CMS [facility]) 01/31/2022    Elevated troponin 01/30/2022    Ischemic cardiomyopathy 01/30/2022    Acute pulmonary edema (CMS HCC) 01/30/2022    Congestive heart failure, unspecified HF chronicity, unspecified heart failure type (CMS [place]) 01/07/2022    Dyspnea 01/06/2022    Chest pain 01/06/2022    HCAP (healthcare-associated pneumonia) 01/01/2022    Hypertension 01/01/2022    Hyperkalemia 01/01/2022    Bilateral pneumonia 01/01/2022    Ischemic heart disease 12/16/2021    Hypertensive emergency 12/16/2021    ESRD (end stage renal disease) (CMS [place]) 12/16/2021    Tobacco use disorder 11/14/2021    Acute exacerbation of COPD with asthma (CMS [place])  10/16/2021    H/O heart artery stent 10/16/2021    Hypertensive urgency 09/29/2021    Acute on chronic respiratory failure with hypoxia (CMS HCC) 08/28/2021    Coronary artery disease involving native coronary artery 07/01/2021    Noncompliance 07/01/2021    Tobacco dependence 12/04/2020    Insomnia 12/04/2020    End-stage renal disease on hemodialysis (CMS HCC) 03/02/2013           Current Discharge Medication List        CONTINUE these medications - NO CHANGES were made during your visit.        Details   amLODIPine 10 mg  Tablet  Commonly known as: NORVASC   10 mg, Oral, DAILY, Take one tablet every day  Refills: 0     aspirin 81 mg Tablet, Delayed Release (E.C.)  Commonly known as: ECOTRIN   81 mg, Oral, DAILY  Refills: 0     azithromycin 500 mg Tablet  Commonly known as: ZITHROMAX   500 mg, Oral, EVERY 24 HOURS  Qty: 6 Tablet  Refills: 0     busPIRone 10 mg Tablet  Commonly known as: BUSPAR   10 mg, Oral, 3 TIMES DAILY  Refills: 0     cloNIDine 0.3 mg/24 hr Patch Weekly  Commonly known as: CATAPRES-TTS   0.3 mg, Transdermal, EVERY 7 DAYS  Refills: 0     cloNIDine HCL 0.2 mg Tablet  Commonly known as: CATAPRES   0.2 mg, Oral, 2 TIMES DAILY, Take one tablet twice a day  Refills: 0     dicyclomine 10 mg Capsule  Commonly known as: BENTYL   10 mg, Oral, 3 TIMES DAILY  Qty: 30 Capsule  Refills: 0     doxazosin 4 mg Tablet  Commonly known as: CARDURA   4 mg, Oral, EVERY EVENING  Refills: 0     doxycycline monohydrate 100 mg Capsule  Commonly known as: MONODOX  100 mg, Oral, 2 TIMES DAILY  Refills: 0     famotidine 20 mg Tablet  Commonly known as: PEPCID   20 mg, Oral, 2 TIMES DAILY  Qty: 20 Tablet  Refills: 0     labetaloL 200 mg Tablet  Commonly known as: NORMODYNE   800 mg, Oral, 3 TIMES DAILY  Refills: 0     nitroGLYCERIN 0.4 mg Tablet, Sublingual  Commonly known as: NITROSTAT   0.4 mg, Sublingual, EVERY 5 MIN PRN, for 3 doses over 15 minutes  Qty: 3 Tablet  Refills: 0     ondansetron 4 mg Tablet, Rapid Dissolve  Commonly known as: ZOFRAN ODT   4 mg, Oral, EVERY 8 HOURS PRN  Qty: 12 Tablet  Refills: 0     rosuvastatin 40 mg Tablet  Commonly known as: CRESTOR   40 mg, Oral, EVERY EVENING  Refills: 0     sucralfate 1 gram Tablet  Commonly known as: CARAFATE   1 g, Oral, EVERY 6 HOURS (SCHEDULED)  Qty: 40 Tablet  Refills: 0     ticagrelor 90 mg Tablet  Commonly known as: BRILINTA   90 mg, Oral, 2 TIMES DAILY  Refills: 0            ASK your doctor about these medications.        Details   predniSONE 20 mg Tablet  Commonly known as:  DELTASONE  Ask about: Should I take this medication?   20 mg, Oral, 2 TIMES DAILY  Qty: 10 Tablet  Refills: 0     ranolazine 500 mg Tablet Sustained Release 12 hr  Commonly known as: RANEXA  Ask about: Should I take this medication?   500 mg, Oral, 2 TIMES DAILY  Qty: 60 Tablet  Refills: 0            Discharge med list refreshed?  No    Allergies   Allergen Reactions    Lisinopril Swelling     Tongue and throat swelling    Cardene [Nicardipine]  Other Adverse Reaction (Add comment)     Chest pain     Oxycodone Itching     HOSPITAL PROCEDURE(S):   No orders of the defined types were placed in this encounter.      [place] is a 47 y.o., 37 American female who presents with shortness of breath. She is well known to out service from previous admissions. She was seen at [facility] ED yesterday and diagnosed with pneumonia, which made her miss dialysis. She became more and more short of breath so came here tonight. She complains of fever, chest pain, abdominal pain, and body aches.  The patient was admitted to inpatient with fluid overload, anemia, pneumonia.  The patient was treated with telemetry, nephrology consult for dialysis, rocephin and doxycycline IV, heme test stools, transfuse with dialysis.  The patient was transferred from ER to telemetry floor.  The patient complained of generalized body aches and was prescribed Ofirmev IV.  Patient refused ofirmev.  Nursing states that the patient was sitting up in bed, suddenly became bradycardic in the 20's, and fell forward into floor.  The patient was pulseless and Code Blue called.  Resuscitation efforts started at 0610 and were ended at 0630 due to no ROSC.  Time of death 51.  Patient's sister/MPOA, [name], notified of death and emotional support given.  See Code Blue sheet and death summary for additional details.  The hospitalist examined  the patient, reviewed material, and agreed with  discharge at this time.  This discharge took greater than 30 minutes.         TRANSITION/POST DISCHARGE CARE/PENDING TESTS/REFERRALS: N/A    CONDITION ON DISCHARGE:  A. Ambulation:  expired  B. Self-care Ability: expired  C. Cognitive Status  expired  D. Code status at discharge: Full Code      LINES/DRAINS/WOUNDS AT DISCHARGE:   Patient Lines/Drains/Airways Status       Active Line / Dialysis Catheter / Dialysis Graft / Drain / Airway / Wound       Name Placement date Placement time Site Days    Peripheral IV Posterior;Right 04-10-22  0204  -- less than 1    AV Fistula Left;Upper Arm 12/05/20  1502  -- 481    Dialysis Catheter Cuffed/Tunneled 08/13/21  1316  -- 230                    DISCHARGE DISPOSITION:   expired  DISCHARGE INSTRUCTIONS:    No discharge procedures on file.       Evette Doffing, FNP-BC    Copies sent to Care Team         Relationship Specialty Notifications Start End    Deel, Leona Carry, FNP PCP - General NURSE PRACTITIONER  12/04/20     Phone: [phone] Fax: [phone]         [place] [address]    [place], [place]  11/15/21     Phone: [phone] Fax: [phone]         [address] [place] [address]    Wilma Flavin, MD  PULMONARY DISEASE  11/15/21     Phone: [phone] Fax: [phone]         [place] [address]    [place], MD  CARDIOVASCULAR DISEASE  11/15/21     Phone: [phone] Fax: [phone]         [address] [place] [address]            Referring providers can utilize [email or web address] to access their referred [facility] patient's information.

## 2022-04-25 DEATH — deceased

## 2022-08-12 NOTE — Care Management Notes (Signed)
Referral Information  ++++++ Placed Provider #1 ++++++  Case Manager: Vicki Johnson  Provider Type: DME  Provider Name: [[facility]] [facility] [place]  Address:  [address]  [place], [place] [address]  Contact: [place] [facility]    Phone: [phone] x  Fax:   Fax: [phone]
